# Patient Record
Sex: Female | Born: 1935
Health system: Southern US, Community
[De-identification: ages and names within clinical notes are randomized; demographics above are authoritative.]

## PROBLEM LIST (undated history)

## (undated) DIAGNOSIS — Z860100 Personal history of colon polyps, unspecified: Secondary | ICD-10-CM

## (undated) DIAGNOSIS — E039 Hypothyroidism, unspecified: Secondary | ICD-10-CM

## (undated) DIAGNOSIS — C801 Malignant (primary) neoplasm, unspecified: Secondary | ICD-10-CM

## (undated) DIAGNOSIS — I739 Peripheral vascular disease, unspecified: Secondary | ICD-10-CM

## (undated) DIAGNOSIS — I4891 Unspecified atrial fibrillation: Secondary | ICD-10-CM

## (undated) DIAGNOSIS — R079 Chest pain, unspecified: Secondary | ICD-10-CM

## (undated) DIAGNOSIS — E785 Hyperlipidemia, unspecified: Secondary | ICD-10-CM

## (undated) DIAGNOSIS — M1990 Unspecified osteoarthritis, unspecified site: Secondary | ICD-10-CM

## (undated) DIAGNOSIS — M545 Low back pain, unspecified: Secondary | ICD-10-CM

## (undated) DIAGNOSIS — R7302 Impaired glucose tolerance (oral): Secondary | ICD-10-CM

## (undated) DIAGNOSIS — D735 Infarction of spleen: Secondary | ICD-10-CM

## (undated) DIAGNOSIS — Z85038 Personal history of other malignant neoplasm of large intestine: Secondary | ICD-10-CM

## (undated) DIAGNOSIS — N390 Urinary tract infection, site not specified: Secondary | ICD-10-CM

## (undated) DIAGNOSIS — Q211 Atrial septal defect: Secondary | ICD-10-CM

## (undated) DIAGNOSIS — G629 Polyneuropathy, unspecified: Secondary | ICD-10-CM

## (undated) DIAGNOSIS — E538 Deficiency of other specified B group vitamins: Secondary | ICD-10-CM

## (undated) DIAGNOSIS — F419 Anxiety disorder, unspecified: Secondary | ICD-10-CM

## (undated) DIAGNOSIS — Q2112 Patent foramen ovale: Secondary | ICD-10-CM

## (undated) DIAGNOSIS — K5730 Diverticulosis of large intestine without perforation or abscess without bleeding: Secondary | ICD-10-CM

## (undated) DIAGNOSIS — H269 Unspecified cataract: Secondary | ICD-10-CM

## (undated) DIAGNOSIS — I059 Rheumatic mitral valve disease, unspecified: Secondary | ICD-10-CM

## (undated) DIAGNOSIS — G8929 Other chronic pain: Secondary | ICD-10-CM

## (undated) DIAGNOSIS — Z8542 Personal history of malignant neoplasm of other parts of uterus: Secondary | ICD-10-CM

## (undated) DIAGNOSIS — Z9889 Other specified postprocedural states: Secondary | ICD-10-CM

## (undated) DIAGNOSIS — I872 Venous insufficiency (chronic) (peripheral): Secondary | ICD-10-CM

## (undated) DIAGNOSIS — I10 Essential (primary) hypertension: Secondary | ICD-10-CM

## (undated) HISTORY — DX: Peripheral vascular disease, unspecified: I73.9

## (undated) HISTORY — DX: Personal history of colonic polyps: Z86.010

## (undated) HISTORY — DX: Patent foramen ovale: Q21.12

## (undated) HISTORY — DX: Essential (primary) hypertension: I10

## (undated) HISTORY — PX: EYE SURGERY: SHX253

## (undated) HISTORY — DX: Chest pain, unspecified: R07.9

## (undated) HISTORY — DX: Rheumatic mitral valve disease, unspecified: I05.9

## (undated) HISTORY — DX: Deficiency of other specified B group vitamins: E53.8

## (undated) HISTORY — DX: Diverticulosis of large intestine without perforation or abscess without bleeding: K57.30

## (undated) HISTORY — DX: Low back pain: M54.5

## (undated) HISTORY — PX: CATARACT EXTRACTION, BILATERAL: SHX1313

## (undated) HISTORY — DX: Anxiety disorder, unspecified: F41.9

## (undated) HISTORY — DX: Polyneuropathy, unspecified: G62.9

## (undated) HISTORY — DX: Personal history of colon polyps, unspecified: Z86.0100

## (undated) HISTORY — DX: Hypothyroidism, unspecified: E03.9

## (undated) HISTORY — DX: Unspecified cataract: H26.9

## (undated) HISTORY — DX: Other chronic pain: G89.29

## (undated) HISTORY — DX: Urinary tract infection, site not specified: N39.0

## (undated) HISTORY — DX: Personal history of other malignant neoplasm of large intestine: Z85.038

## (undated) HISTORY — DX: Atrial septal defect: Q21.1

## (undated) HISTORY — DX: Hyperlipidemia, unspecified: E78.5

## (undated) HISTORY — DX: Impaired glucose tolerance (oral): R73.02

## (undated) HISTORY — DX: Low back pain, unspecified: M54.50

## (undated) HISTORY — DX: Personal history of malignant neoplasm of other parts of uterus: Z85.42

## (undated) HISTORY — DX: Unspecified atrial fibrillation: I48.91

## (undated) HISTORY — DX: Venous insufficiency (chronic) (peripheral): I87.2

## (undated) HISTORY — DX: Unspecified osteoarthritis, unspecified site: M19.90

## (undated) HISTORY — PX: OTHER SURGICAL HISTORY: SHX169

## (undated) HISTORY — DX: Other specified postprocedural states: Z98.890

## (undated) HISTORY — DX: Infarction of spleen: D73.5

---

## 1992-11-26 HISTORY — PX: OTHER SURGICAL HISTORY: SHX169

## 1993-10-30 ENCOUNTER — Encounter: Payer: Self-pay | Admitting: Internal Medicine

## 1994-08-27 ENCOUNTER — Encounter: Payer: Self-pay | Admitting: Internal Medicine

## 1995-09-23 ENCOUNTER — Encounter: Payer: Self-pay | Admitting: Internal Medicine

## 1999-01-11 ENCOUNTER — Other Ambulatory Visit: Admission: RE | Admit: 1999-01-11 | Discharge: 1999-01-11 | Payer: Self-pay | Admitting: Obstetrics & Gynecology

## 2000-01-17 ENCOUNTER — Emergency Department (HOSPITAL_COMMUNITY): Admission: EM | Admit: 2000-01-17 | Discharge: 2000-01-17 | Payer: Self-pay | Admitting: Emergency Medicine

## 2000-01-17 ENCOUNTER — Encounter: Payer: Self-pay | Admitting: Emergency Medicine

## 2000-03-15 ENCOUNTER — Other Ambulatory Visit: Admission: RE | Admit: 2000-03-15 | Discharge: 2000-03-15 | Payer: Self-pay | Admitting: Obstetrics & Gynecology

## 2001-06-17 ENCOUNTER — Other Ambulatory Visit: Admission: RE | Admit: 2001-06-17 | Discharge: 2001-06-17 | Payer: Self-pay | Admitting: Obstetrics & Gynecology

## 2002-02-10 ENCOUNTER — Ambulatory Visit (HOSPITAL_COMMUNITY): Admission: RE | Admit: 2002-02-10 | Discharge: 2002-02-10 | Payer: Self-pay | Admitting: Internal Medicine

## 2002-02-10 ENCOUNTER — Encounter: Payer: Self-pay | Admitting: Internal Medicine

## 2002-07-22 ENCOUNTER — Other Ambulatory Visit: Admission: RE | Admit: 2002-07-22 | Discharge: 2002-07-22 | Payer: Self-pay | Admitting: Obstetrics & Gynecology

## 2003-06-23 ENCOUNTER — Ambulatory Visit (HOSPITAL_COMMUNITY): Admission: RE | Admit: 2003-06-23 | Discharge: 2003-06-23 | Payer: Self-pay | Admitting: Cardiovascular Disease

## 2003-08-26 ENCOUNTER — Ambulatory Visit (HOSPITAL_COMMUNITY): Admission: RE | Admit: 2003-08-26 | Discharge: 2003-08-26 | Payer: Self-pay | Admitting: Orthopedic Surgery

## 2003-08-26 ENCOUNTER — Encounter: Payer: Self-pay | Admitting: Orthopedic Surgery

## 2003-10-20 ENCOUNTER — Inpatient Hospital Stay (HOSPITAL_COMMUNITY): Admission: EM | Admit: 2003-10-20 | Discharge: 2003-10-26 | Payer: Self-pay | Admitting: Emergency Medicine

## 2004-06-05 ENCOUNTER — Emergency Department (HOSPITAL_COMMUNITY): Admission: EM | Admit: 2004-06-05 | Discharge: 2004-06-05 | Payer: Self-pay | Admitting: Emergency Medicine

## 2004-09-08 ENCOUNTER — Other Ambulatory Visit: Admission: RE | Admit: 2004-09-08 | Discharge: 2004-09-08 | Payer: Self-pay | Admitting: Obstetrics and Gynecology

## 2004-10-04 ENCOUNTER — Ambulatory Visit: Payer: Self-pay | Admitting: Pulmonary Disease

## 2004-10-17 ENCOUNTER — Ambulatory Visit: Payer: Self-pay | Admitting: Internal Medicine

## 2004-10-17 ENCOUNTER — Ambulatory Visit: Payer: Self-pay | Admitting: Cardiology

## 2004-10-17 ENCOUNTER — Ambulatory Visit: Payer: Self-pay

## 2004-10-24 ENCOUNTER — Ambulatory Visit: Payer: Self-pay | Admitting: Internal Medicine

## 2004-11-01 ENCOUNTER — Ambulatory Visit: Payer: Self-pay | Admitting: Pulmonary Disease

## 2004-11-09 ENCOUNTER — Emergency Department (HOSPITAL_COMMUNITY): Admission: EM | Admit: 2004-11-09 | Discharge: 2004-11-09 | Payer: Self-pay | Admitting: Emergency Medicine

## 2004-11-14 ENCOUNTER — Ambulatory Visit: Payer: Self-pay | Admitting: Cardiology

## 2004-11-26 HISTORY — PX: OTHER SURGICAL HISTORY: SHX169

## 2004-12-07 ENCOUNTER — Ambulatory Visit: Payer: Self-pay

## 2004-12-21 ENCOUNTER — Ambulatory Visit: Payer: Self-pay | Admitting: Cardiology

## 2004-12-27 ENCOUNTER — Ambulatory Visit: Payer: Self-pay | Admitting: Cardiology

## 2005-01-02 ENCOUNTER — Ambulatory Visit: Payer: Self-pay | Admitting: Pulmonary Disease

## 2005-01-03 ENCOUNTER — Ambulatory Visit: Payer: Self-pay | Admitting: Internal Medicine

## 2005-01-17 ENCOUNTER — Ambulatory Visit: Payer: Self-pay | Admitting: Cardiology

## 2005-02-07 ENCOUNTER — Ambulatory Visit: Payer: Self-pay | Admitting: Cardiology

## 2005-02-19 ENCOUNTER — Ambulatory Visit: Payer: Self-pay | Admitting: Internal Medicine

## 2005-02-21 ENCOUNTER — Ambulatory Visit: Payer: Self-pay | Admitting: Cardiology

## 2005-02-27 ENCOUNTER — Ambulatory Visit: Payer: Self-pay | Admitting: Internal Medicine

## 2005-03-02 ENCOUNTER — Ambulatory Visit: Payer: Self-pay | Admitting: Cardiovascular Disease

## 2005-03-06 ENCOUNTER — Ambulatory Visit: Payer: Self-pay | Admitting: Cardiology

## 2005-03-22 ENCOUNTER — Ambulatory Visit: Payer: Self-pay | Admitting: Cardiology

## 2005-03-26 ENCOUNTER — Ambulatory Visit: Payer: Self-pay | Admitting: Cardiovascular Disease

## 2005-03-28 ENCOUNTER — Ambulatory Visit: Payer: Self-pay | Admitting: *Deleted

## 2005-04-11 ENCOUNTER — Ambulatory Visit: Payer: Self-pay | Admitting: Cardiology

## 2005-04-24 ENCOUNTER — Ambulatory Visit: Payer: Self-pay | Admitting: Pulmonary Disease

## 2005-05-02 ENCOUNTER — Ambulatory Visit: Payer: Self-pay | Admitting: Pulmonary Disease

## 2005-05-09 ENCOUNTER — Ambulatory Visit: Payer: Self-pay | Admitting: *Deleted

## 2005-05-11 ENCOUNTER — Inpatient Hospital Stay (HOSPITAL_COMMUNITY): Admission: RE | Admit: 2005-05-11 | Discharge: 2005-05-15 | Payer: Self-pay | Admitting: Neurosurgery

## 2005-05-18 ENCOUNTER — Ambulatory Visit: Payer: Self-pay | Admitting: Cardiology

## 2005-05-25 ENCOUNTER — Ambulatory Visit: Payer: Self-pay | Admitting: Cardiology

## 2005-06-08 ENCOUNTER — Ambulatory Visit: Payer: Self-pay | Admitting: Cardiology

## 2005-07-06 ENCOUNTER — Ambulatory Visit: Payer: Self-pay | Admitting: Cardiology

## 2005-08-02 ENCOUNTER — Ambulatory Visit: Payer: Self-pay | Admitting: Internal Medicine

## 2005-08-06 ENCOUNTER — Ambulatory Visit: Payer: Self-pay | Admitting: Cardiovascular Disease

## 2005-08-30 ENCOUNTER — Ambulatory Visit: Payer: Self-pay | Admitting: Cardiology

## 2005-09-04 ENCOUNTER — Ambulatory Visit: Payer: Self-pay | Admitting: Pulmonary Disease

## 2005-09-27 ENCOUNTER — Ambulatory Visit: Payer: Self-pay | Admitting: Cardiology

## 2005-10-25 ENCOUNTER — Ambulatory Visit: Payer: Self-pay | Admitting: Cardiology

## 2005-10-30 ENCOUNTER — Ambulatory Visit: Payer: Self-pay | Admitting: Pulmonary Disease

## 2005-11-22 ENCOUNTER — Ambulatory Visit: Payer: Self-pay | Admitting: *Deleted

## 2005-12-05 ENCOUNTER — Ambulatory Visit: Payer: Self-pay | Admitting: Pulmonary Disease

## 2005-12-13 ENCOUNTER — Ambulatory Visit: Payer: Self-pay | Admitting: Cardiovascular Disease

## 2005-12-20 ENCOUNTER — Ambulatory Visit: Payer: Self-pay | Admitting: Cardiology

## 2006-01-15 ENCOUNTER — Encounter: Payer: Self-pay | Admitting: Cardiovascular Disease

## 2006-01-15 ENCOUNTER — Ambulatory Visit: Payer: Self-pay

## 2006-01-15 ENCOUNTER — Ambulatory Visit: Payer: Self-pay | Admitting: Cardiology

## 2006-01-15 ENCOUNTER — Ambulatory Visit: Payer: Self-pay | Admitting: Cardiovascular Disease

## 2006-02-13 ENCOUNTER — Ambulatory Visit: Payer: Self-pay | Admitting: Cardiology

## 2006-02-13 ENCOUNTER — Ambulatory Visit: Payer: Self-pay | Admitting: Pulmonary Disease

## 2006-02-27 ENCOUNTER — Ambulatory Visit: Payer: Self-pay | Admitting: Cardiovascular Disease

## 2006-03-13 ENCOUNTER — Ambulatory Visit: Payer: Self-pay | Admitting: Cardiovascular Disease

## 2006-03-26 HISTORY — PX: ANTERIOR AND POSTERIOR VAGINAL REPAIR: SUR5

## 2006-04-10 ENCOUNTER — Ambulatory Visit: Payer: Self-pay | Admitting: Cardiology

## 2006-04-24 ENCOUNTER — Inpatient Hospital Stay (HOSPITAL_COMMUNITY): Admission: RE | Admit: 2006-04-24 | Discharge: 2006-04-26 | Payer: Self-pay | Admitting: Obstetrics & Gynecology

## 2006-05-03 ENCOUNTER — Ambulatory Visit: Payer: Self-pay | Admitting: Cardiology

## 2006-05-10 ENCOUNTER — Ambulatory Visit: Payer: Self-pay | Admitting: Cardiology

## 2006-05-27 ENCOUNTER — Ambulatory Visit: Payer: Self-pay | Admitting: Cardiovascular Disease

## 2006-05-31 ENCOUNTER — Ambulatory Visit: Payer: Self-pay | Admitting: Pulmonary Disease

## 2006-06-04 ENCOUNTER — Ambulatory Visit: Payer: Self-pay | Admitting: *Deleted

## 2006-06-24 ENCOUNTER — Ambulatory Visit: Payer: Self-pay | Admitting: Cardiovascular Disease

## 2006-07-02 ENCOUNTER — Ambulatory Visit: Payer: Self-pay | Admitting: Cardiology

## 2006-07-16 ENCOUNTER — Ambulatory Visit: Payer: Self-pay | Admitting: Internal Medicine

## 2006-07-26 ENCOUNTER — Ambulatory Visit: Payer: Self-pay | Admitting: Cardiovascular Disease

## 2006-07-26 ENCOUNTER — Ambulatory Visit: Payer: Self-pay | Admitting: Cardiology

## 2006-07-30 ENCOUNTER — Ambulatory Visit: Payer: Self-pay | Admitting: Cardiovascular Disease

## 2006-08-05 ENCOUNTER — Ambulatory Visit: Payer: Self-pay | Admitting: Cardiovascular Disease

## 2006-08-08 ENCOUNTER — Ambulatory Visit: Payer: Self-pay | Admitting: Cardiology

## 2006-08-23 ENCOUNTER — Ambulatory Visit: Payer: Self-pay | Admitting: Cardiovascular Disease

## 2006-08-23 ENCOUNTER — Ambulatory Visit: Payer: Self-pay | Admitting: Cardiology

## 2006-08-30 ENCOUNTER — Ambulatory Visit: Payer: Self-pay | Admitting: Pulmonary Disease

## 2006-08-30 ENCOUNTER — Ambulatory Visit: Payer: Self-pay | Admitting: Cardiovascular Disease

## 2006-09-05 ENCOUNTER — Ambulatory Visit: Payer: Self-pay | Admitting: Cardiology

## 2006-10-03 ENCOUNTER — Ambulatory Visit: Payer: Self-pay | Admitting: Internal Medicine

## 2006-10-03 ENCOUNTER — Ambulatory Visit: Payer: Self-pay | Admitting: Cardiovascular Disease

## 2006-10-11 ENCOUNTER — Ambulatory Visit: Payer: Self-pay | Admitting: Pulmonary Disease

## 2006-10-11 ENCOUNTER — Ambulatory Visit: Payer: Self-pay | Admitting: Internal Medicine

## 2006-11-05 ENCOUNTER — Ambulatory Visit: Payer: Self-pay | Admitting: Cardiovascular Disease

## 2007-01-20 ENCOUNTER — Emergency Department (HOSPITAL_COMMUNITY): Admission: EM | Admit: 2007-01-20 | Discharge: 2007-01-20 | Payer: Self-pay | Admitting: Emergency Medicine

## 2007-02-25 HISTORY — PX: OTHER SURGICAL HISTORY: SHX169

## 2007-03-05 ENCOUNTER — Encounter: Admission: RE | Admit: 2007-03-05 | Discharge: 2007-03-05 | Payer: Self-pay | Admitting: Orthopedic Surgery

## 2007-03-06 ENCOUNTER — Ambulatory Visit (HOSPITAL_BASED_OUTPATIENT_CLINIC_OR_DEPARTMENT_OTHER): Admission: RE | Admit: 2007-03-06 | Discharge: 2007-03-06 | Payer: Self-pay | Admitting: Orthopedic Surgery

## 2007-04-07 ENCOUNTER — Ambulatory Visit: Payer: Self-pay | Admitting: Cardiovascular Disease

## 2007-04-07 LAB — CONVERTED CEMR LAB
Chloride: 105 meq/L (ref 96–112)
GFR calc Af Amer: 105 mL/min
GFR calc non Af Amer: 86 mL/min
Sodium: 143 meq/L (ref 135–145)

## 2007-04-23 ENCOUNTER — Emergency Department (HOSPITAL_COMMUNITY): Admission: EM | Admit: 2007-04-23 | Discharge: 2007-04-24 | Payer: Self-pay | Admitting: Emergency Medicine

## 2007-04-25 ENCOUNTER — Ambulatory Visit: Payer: Self-pay | Admitting: Internal Medicine

## 2007-04-25 ENCOUNTER — Inpatient Hospital Stay (HOSPITAL_COMMUNITY): Admission: EM | Admit: 2007-04-25 | Discharge: 2007-04-29 | Payer: Self-pay | Admitting: Emergency Medicine

## 2007-05-06 ENCOUNTER — Ambulatory Visit: Payer: Self-pay | Admitting: Pulmonary Disease

## 2007-05-06 LAB — CONVERTED CEMR LAB
Bilirubin Urine: NEGATIVE
Hemoglobin, Urine: NEGATIVE
Ketones, ur: NEGATIVE mg/dL
Leukocytes, UA: NEGATIVE
Specific Gravity, Urine: 1.01 (ref 1.000–1.03)
Total Protein, Urine: NEGATIVE mg/dL
Urine Glucose: NEGATIVE mg/dL
pH: 5.5 (ref 5.0–8.0)

## 2007-05-09 ENCOUNTER — Ambulatory Visit: Payer: Self-pay | Admitting: Cardiovascular Disease

## 2007-05-12 ENCOUNTER — Ambulatory Visit: Payer: Self-pay | Admitting: Pulmonary Disease

## 2007-05-12 ENCOUNTER — Ambulatory Visit: Payer: Self-pay | Admitting: Cardiology

## 2007-05-12 LAB — CONVERTED CEMR LAB
ALT: 14 units/L (ref 0–40)
AST: 16 units/L (ref 0–37)
Albumin: 3.7 g/dL (ref 3.5–5.2)
Basophils Absolute: 0 10*3/uL (ref 0.0–0.1)
Bilirubin, Direct: 0.2 mg/dL (ref 0.0–0.3)
Calcium: 8.9 mg/dL (ref 8.4–10.5)
Chloride: 109 meq/L (ref 96–112)
Eosinophils Absolute: 0.2 10*3/uL (ref 0.0–0.6)
GFR calc Af Amer: 105 mL/min
GFR calc non Af Amer: 86 mL/min
Glucose, Bld: 106 mg/dL — ABNORMAL HIGH (ref 70–99)
Lymphocytes Relative: 21.9 % (ref 12.0–46.0)
MCHC: 35.1 g/dL (ref 30.0–36.0)
MCV: 89.9 fL (ref 78.0–100.0)
Monocytes Relative: 6.8 % (ref 3.0–11.0)
Neutro Abs: 5.5 10*3/uL (ref 1.4–7.7)
Platelets: 355 10*3/uL (ref 150–400)
RBC: 3.81 M/uL — ABNORMAL LOW (ref 3.87–5.11)
Sed Rate: 63 mm/hr — ABNORMAL HIGH (ref 0–25)
WBC: 8.1 10*3/uL (ref 4.5–10.5)

## 2007-05-14 ENCOUNTER — Ambulatory Visit: Payer: Self-pay | Admitting: Cardiology

## 2007-05-14 ENCOUNTER — Ambulatory Visit: Payer: Self-pay | Admitting: Cardiovascular Disease

## 2007-05-14 ENCOUNTER — Ambulatory Visit: Payer: Self-pay | Admitting: Internal Medicine

## 2007-05-15 ENCOUNTER — Ambulatory Visit: Payer: Self-pay | Admitting: Internal Medicine

## 2007-05-16 ENCOUNTER — Ambulatory Visit: Payer: Self-pay | Admitting: Cardiovascular Disease

## 2007-05-23 ENCOUNTER — Ambulatory Visit: Payer: Self-pay | Admitting: Cardiology

## 2007-05-26 ENCOUNTER — Ambulatory Visit: Payer: Self-pay | Admitting: Pulmonary Disease

## 2007-05-29 ENCOUNTER — Ambulatory Visit: Payer: Self-pay | Admitting: Cardiovascular Disease

## 2007-05-29 ENCOUNTER — Ambulatory Visit: Payer: Self-pay | Admitting: Internal Medicine

## 2007-06-05 ENCOUNTER — Ambulatory Visit: Payer: Self-pay

## 2007-06-06 ENCOUNTER — Ambulatory Visit: Payer: Self-pay | Admitting: Cardiovascular Disease

## 2007-06-07 ENCOUNTER — Encounter (INDEPENDENT_AMBULATORY_CARE_PROVIDER_SITE_OTHER): Payer: Self-pay | Admitting: *Deleted

## 2007-06-07 ENCOUNTER — Encounter: Admission: RE | Admit: 2007-06-07 | Discharge: 2007-06-07 | Payer: Self-pay | Admitting: Cardiovascular Disease

## 2007-06-11 ENCOUNTER — Ambulatory Visit: Payer: Self-pay | Admitting: Internal Medicine

## 2007-06-18 ENCOUNTER — Ambulatory Visit: Payer: Self-pay | Admitting: Internal Medicine

## 2007-07-02 ENCOUNTER — Ambulatory Visit: Payer: Self-pay | Admitting: Cardiology

## 2007-07-08 ENCOUNTER — Ambulatory Visit: Payer: Self-pay | Admitting: Pulmonary Disease

## 2007-07-10 ENCOUNTER — Ambulatory Visit: Payer: Self-pay | Admitting: Internal Medicine

## 2007-07-18 ENCOUNTER — Ambulatory Visit: Payer: Self-pay | Admitting: Cardiology

## 2007-07-21 ENCOUNTER — Ambulatory Visit: Payer: Self-pay | Admitting: Cardiovascular Disease

## 2007-07-31 ENCOUNTER — Ambulatory Visit: Payer: Self-pay | Admitting: Cardiology

## 2007-07-31 ENCOUNTER — Ambulatory Visit: Payer: Self-pay

## 2007-07-31 ENCOUNTER — Encounter: Payer: Self-pay | Admitting: Cardiovascular Disease

## 2007-08-07 ENCOUNTER — Ambulatory Visit: Payer: Self-pay | Admitting: Internal Medicine

## 2007-09-02 ENCOUNTER — Ambulatory Visit: Payer: Self-pay | Admitting: Cardiovascular Disease

## 2007-10-02 ENCOUNTER — Ambulatory Visit: Payer: Self-pay | Admitting: Cardiology

## 2007-10-08 ENCOUNTER — Ambulatory Visit: Payer: Self-pay | Admitting: Pulmonary Disease

## 2007-10-08 LAB — CONVERTED CEMR LAB
Ketones, ur: NEGATIVE mg/dL
Leukocytes, UA: NEGATIVE
Nitrite: NEGATIVE
Urobilinogen, UA: 0.2 (ref 0.0–1.0)
pH: 6 (ref 5.0–8.0)

## 2007-11-05 ENCOUNTER — Ambulatory Visit: Payer: Self-pay | Admitting: Cardiovascular Disease

## 2007-11-14 ENCOUNTER — Encounter: Payer: Self-pay | Admitting: Pulmonary Disease

## 2007-11-18 ENCOUNTER — Ambulatory Visit: Payer: Self-pay | Admitting: Cardiology

## 2007-12-02 ENCOUNTER — Ambulatory Visit: Payer: Self-pay | Admitting: Cardiology

## 2007-12-24 ENCOUNTER — Ambulatory Visit: Payer: Self-pay | Admitting: Internal Medicine

## 2008-01-13 DIAGNOSIS — M545 Low back pain: Secondary | ICD-10-CM | POA: Insufficient documentation

## 2008-01-13 DIAGNOSIS — I4891 Unspecified atrial fibrillation: Secondary | ICD-10-CM

## 2008-01-13 DIAGNOSIS — E785 Hyperlipidemia, unspecified: Secondary | ICD-10-CM

## 2008-01-13 DIAGNOSIS — E039 Hypothyroidism, unspecified: Secondary | ICD-10-CM

## 2008-01-13 DIAGNOSIS — I10 Essential (primary) hypertension: Secondary | ICD-10-CM

## 2008-01-13 DIAGNOSIS — C189 Malignant neoplasm of colon, unspecified: Secondary | ICD-10-CM

## 2008-01-21 ENCOUNTER — Ambulatory Visit: Payer: Self-pay | Admitting: Cardiology

## 2008-01-29 ENCOUNTER — Emergency Department (HOSPITAL_COMMUNITY): Admission: EM | Admit: 2008-01-29 | Discharge: 2008-01-29 | Payer: Self-pay | Admitting: Family Medicine

## 2008-01-30 ENCOUNTER — Ambulatory Visit: Payer: Self-pay | Admitting: Cardiology

## 2008-01-30 ENCOUNTER — Ambulatory Visit: Payer: Self-pay | Admitting: Cardiovascular Disease

## 2008-02-13 ENCOUNTER — Ambulatory Visit: Payer: Self-pay | Admitting: Cardiology

## 2008-02-16 ENCOUNTER — Ambulatory Visit: Payer: Self-pay | Admitting: Pulmonary Disease

## 2008-02-16 ENCOUNTER — Encounter: Payer: Self-pay | Admitting: Adult Health

## 2008-02-16 ENCOUNTER — Telehealth: Payer: Self-pay | Admitting: Critical Care Medicine

## 2008-02-17 LAB — CONVERTED CEMR LAB
BUN: 11 mg/dL (ref 6–23)
Basophils Relative: 0.9 % (ref 0.0–1.0)
CO2: 29 meq/L (ref 19–32)
Calcium: 9 mg/dL (ref 8.4–10.5)
Chloride: 104 meq/L (ref 96–112)
Creatinine, Ser: 0.6 mg/dL (ref 0.4–1.2)
Eosinophils Relative: 2.6 % (ref 0.0–5.0)
Glucose, Bld: 87 mg/dL (ref 70–99)
Hemoglobin: 12.5 g/dL (ref 12.0–15.0)
Lymphocytes Relative: 20.2 % (ref 12.0–46.0)
Neutro Abs: 6.8 10*3/uL (ref 1.4–7.7)
Neutrophils Relative %: 67.1 % (ref 43.0–77.0)
RBC: 4.14 M/uL (ref 3.87–5.11)
WBC: 10.1 10*3/uL (ref 4.5–10.5)

## 2008-02-18 IMAGING — CT CT ABDOMEN W/ CM
2 of 8 series · 15 of 46 positions shown, 19 images · IV contrast (Omnipaque 300)
Comparison: None.

CLINICAL DATA: Left upper quadrant pain with fever.  Urinary tract infection.  
ABDOMEN CT WITH CONTRAS ? 05/12/07:
TECHNIQUE: Multidetector CT imaging of the abdomen was performed following the standard protocol during bolus administration of intravenous contrast.
Contrast:  125 cc Omnipaque 300.
TECHNIQUE: Multidetector CT imaging of the pelvis was performed following the standard protocol during bolus administration of intravenous contrast.

[Series 2: abd_pel 5.0 b30f st · axial · 0.82mm/px · z∈[-376,+4]mm · 12 of 86 slices shown, 16 images]
[im 5/86  soft-tissue]
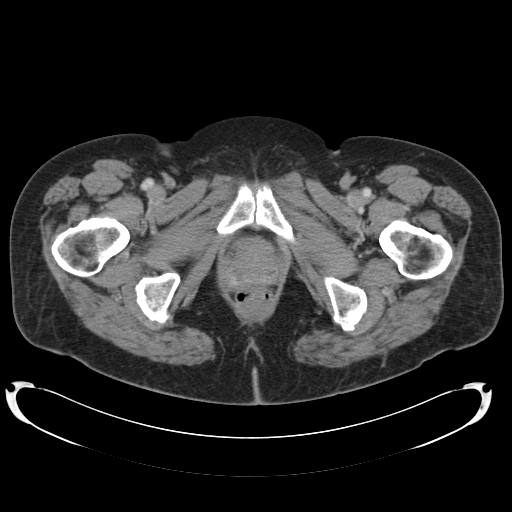
[im 5/86  bone]
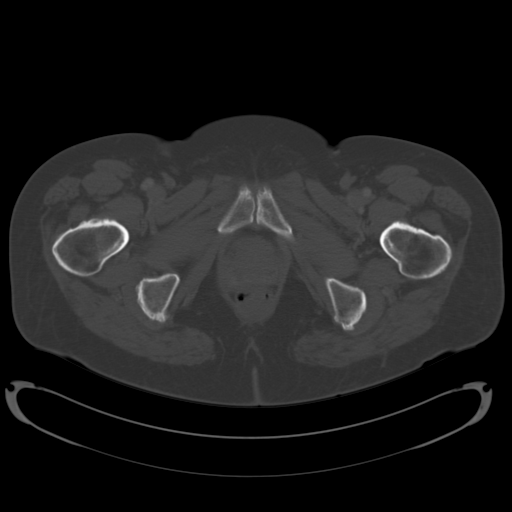
[im 15/86  soft-tissue]
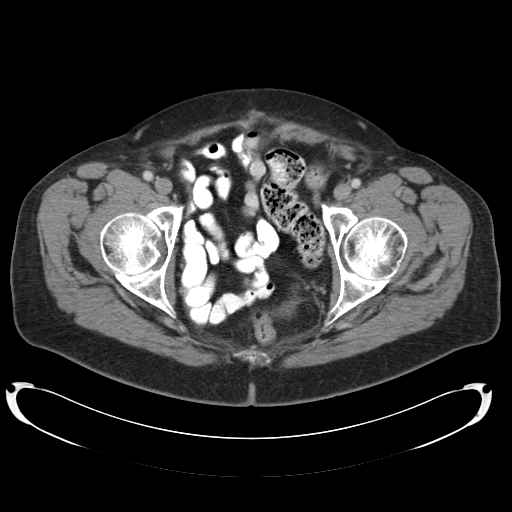
[im 24/86  soft-tissue]
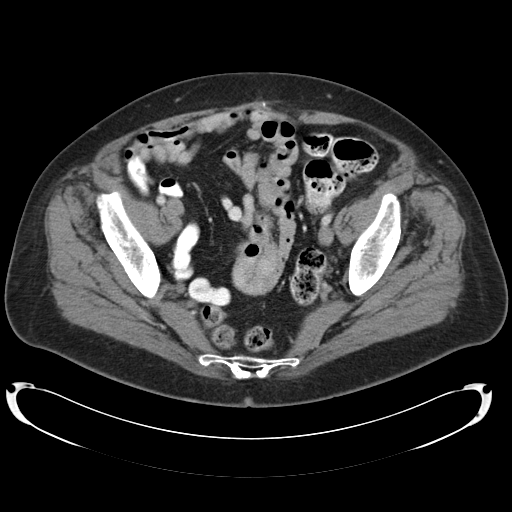
[im 29/86  soft-tissue]
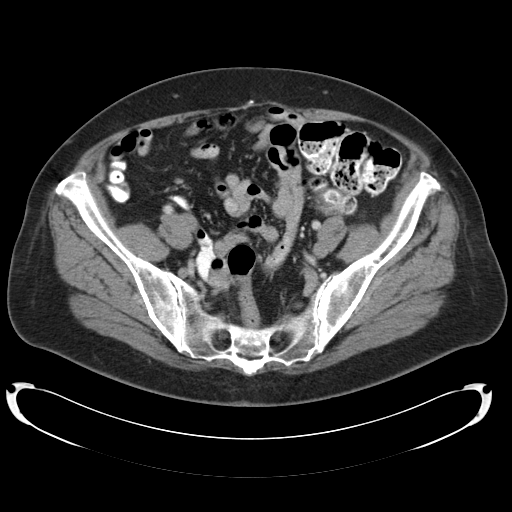
[im 38/86  soft-tissue]
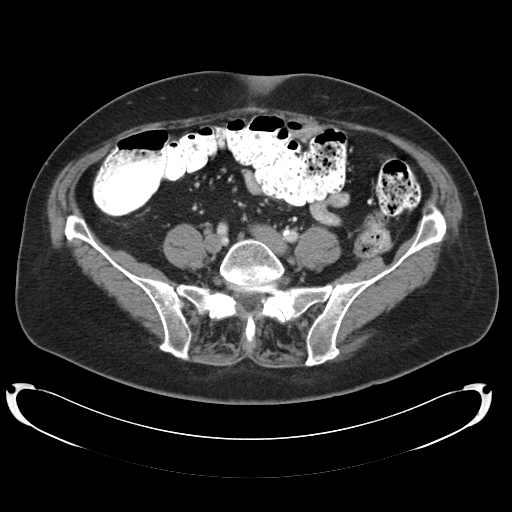
[im 48/86  soft-tissue]
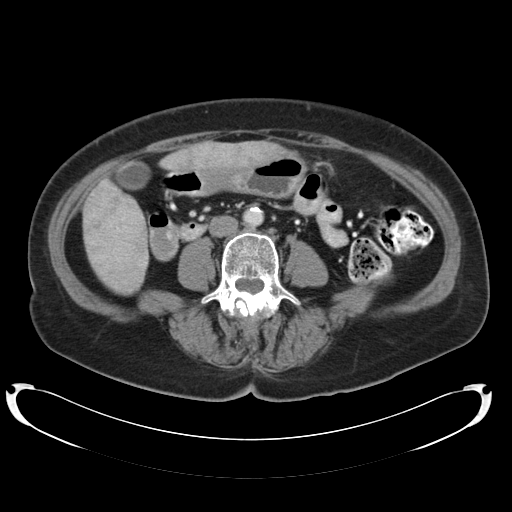
[im 57/86  soft-tissue]
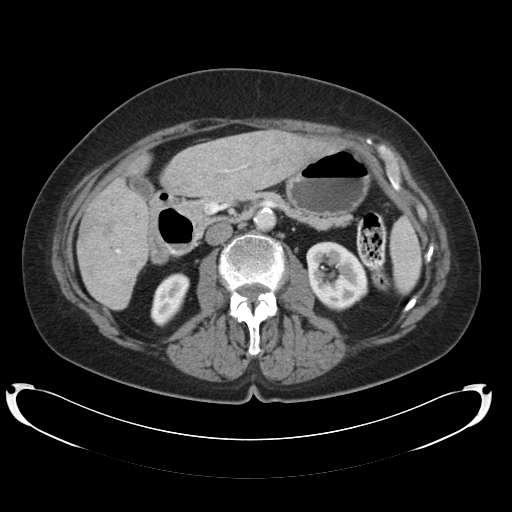
[im 62/86  soft-tissue]
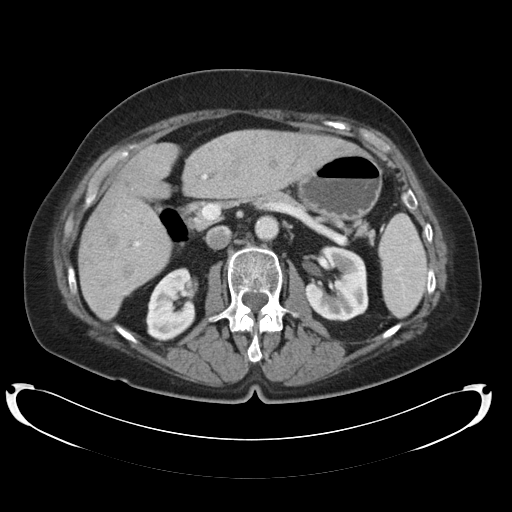
[im 67/86  lung]
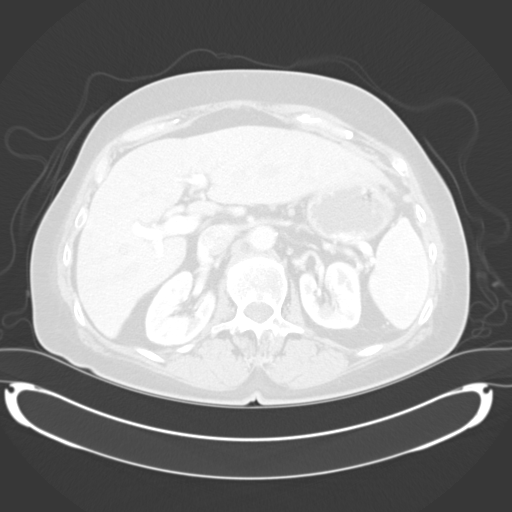
[im 71/86  soft-tissue]
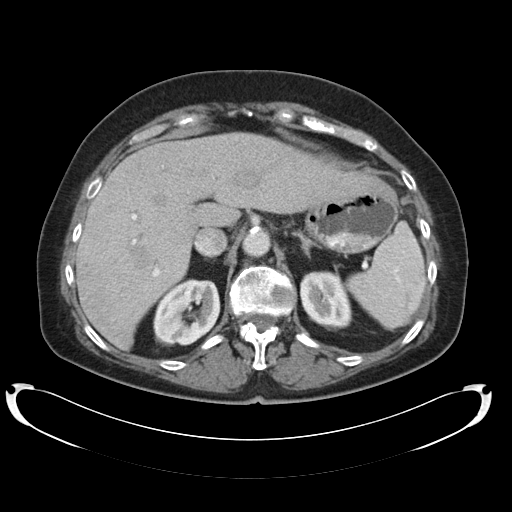
[im 71/86  lung]
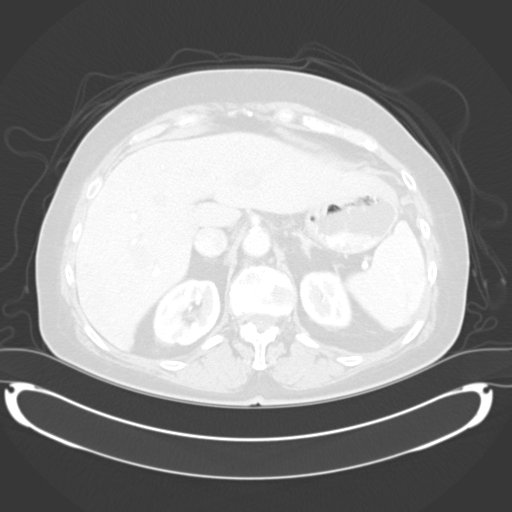
[im 71/86  bone]
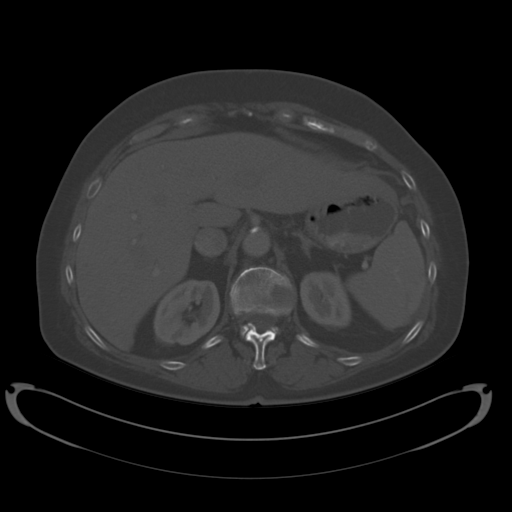
[im 76/86  lung]
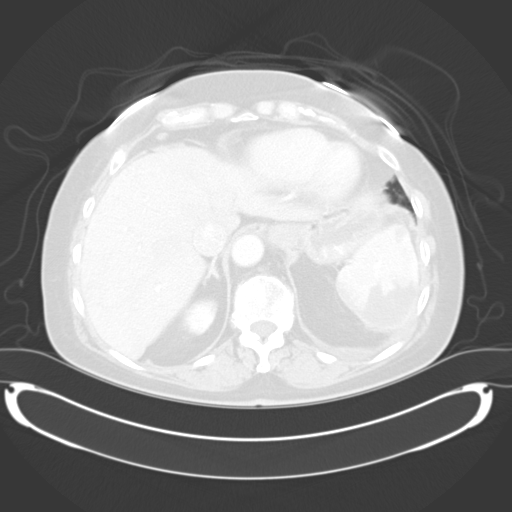
[im 81/86  soft-tissue]
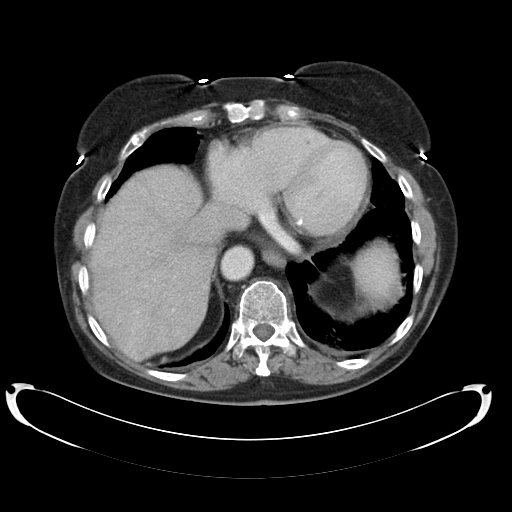
[im 81/86  lung]
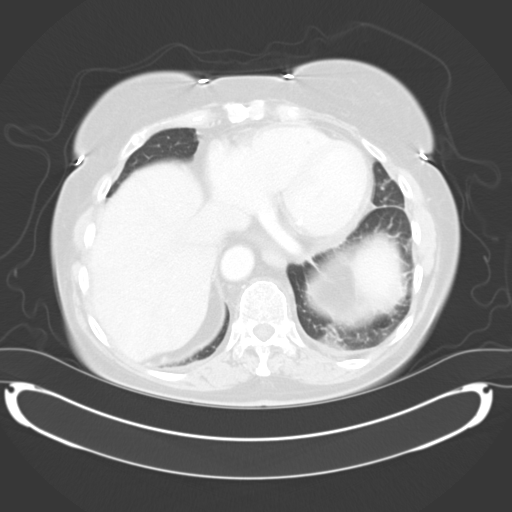

[Series 602: <mpr thick range> · coronal · 0.85mm/px · 3 of 82 slices shown]
[im 21/82  soft-tissue]
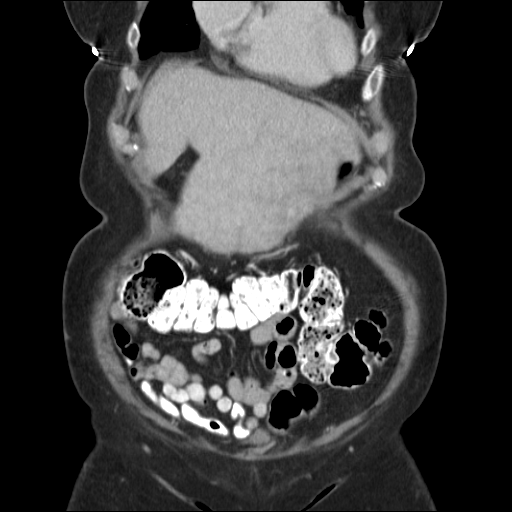
[im 41/82  soft-tissue]
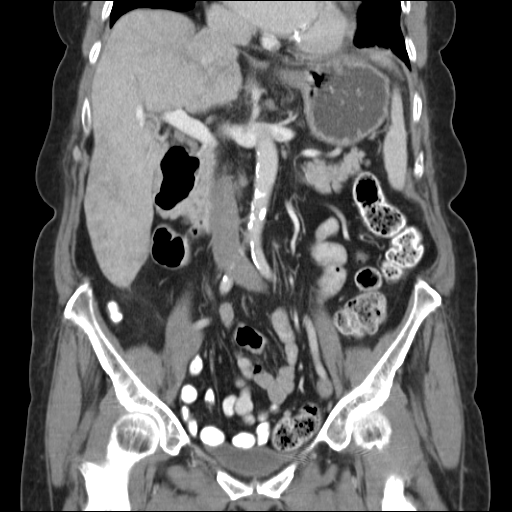
[im 61/82  soft-tissue]
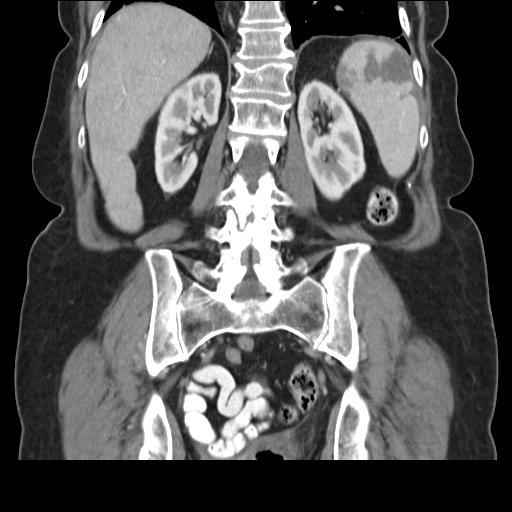

[15 of 46 positions shown; findings below may reference images not displayed]

FINDINGS: Trace left pleural effusion.  Probable subpleural atelectasis in the right lower lobe.  Heart size is at the upper limits of normal.  
The liver is enlarged at 24 cm. Mild intrahepatic biliary duct dilatation.  There are scattered subcentimeter low attenuation lesions.  The gallbladder and adrenal glands are unremarkable.   There is mild heterogeneity to the enhancement of the upper pole of the right kidney.  A small cyst is seen in the upper pole of the left kidney.   A large area of infarction is seen in the spleen.  The pancreas is unremarkable.   There is mild gastric antral wall thickening.  No pathologically enlarged lymph nodes.
IMPRESSION: 1.  Question infarction vs focal pyelonephritis in the upper pole of the right kidney. 
2.  Splenic infarction. 
3.  Gastric antral wall thickening.  This may be due to peristalsis but mass cannot be excluded. 
4.  Hepatomegaly. 
PELVIS CT WITH CONTRAST:
FINDINGS: The patient appears to be status-post right hemicolectomy.  The colon is otherwise unremarkable.  The uterus is absent.  No pathologically enlarged lymph nodes.  No free fluid.   Atherosclerotic calcification in the arterial vasculature.   No worrisome lytic or sclerotic lesions.
IMPRESSION: No acute findings.

## 2008-02-20 ENCOUNTER — Ambulatory Visit: Payer: Self-pay | Admitting: Pulmonary Disease

## 2008-02-20 DIAGNOSIS — I872 Venous insufficiency (chronic) (peripheral): Secondary | ICD-10-CM

## 2008-02-20 DIAGNOSIS — I739 Peripheral vascular disease, unspecified: Secondary | ICD-10-CM | POA: Insufficient documentation

## 2008-02-20 DIAGNOSIS — I340 Nonrheumatic mitral (valve) insufficiency: Secondary | ICD-10-CM | POA: Insufficient documentation

## 2008-02-20 DIAGNOSIS — M1990 Unspecified osteoarthritis, unspecified site: Secondary | ICD-10-CM | POA: Insufficient documentation

## 2008-02-20 DIAGNOSIS — Z8542 Personal history of malignant neoplasm of other parts of uterus: Secondary | ICD-10-CM

## 2008-02-20 DIAGNOSIS — N390 Urinary tract infection, site not specified: Secondary | ICD-10-CM

## 2008-02-20 DIAGNOSIS — F411 Generalized anxiety disorder: Secondary | ICD-10-CM | POA: Insufficient documentation

## 2008-02-20 DIAGNOSIS — M810 Age-related osteoporosis without current pathological fracture: Secondary | ICD-10-CM | POA: Insufficient documentation

## 2008-02-20 DIAGNOSIS — D7389 Other diseases of spleen: Secondary | ICD-10-CM | POA: Insufficient documentation

## 2008-03-03 ENCOUNTER — Ambulatory Visit: Payer: Self-pay | Admitting: Internal Medicine

## 2008-03-25 ENCOUNTER — Encounter: Payer: Self-pay | Admitting: Pulmonary Disease

## 2008-03-26 ENCOUNTER — Ambulatory Visit: Payer: Self-pay | Admitting: Cardiology

## 2008-04-09 ENCOUNTER — Ambulatory Visit: Payer: Self-pay | Admitting: Internal Medicine

## 2008-04-21 LAB — CONVERTED CEMR LAB
ALT: 16 units/L (ref 0–35)
Albumin: 3.6 g/dL (ref 3.5–5.2)
BUN: 17 mg/dL (ref 6–23)
Bilirubin, Direct: 0.2 mg/dL (ref 0.0–0.3)
CO2: 29 meq/L (ref 19–32)
Calcium: 8.9 mg/dL (ref 8.4–10.5)
Cholesterol: 114 mg/dL (ref 0–200)
Glucose, Bld: 102 mg/dL — ABNORMAL HIGH (ref 70–99)
HDL: 35.4 mg/dL — ABNORMAL LOW (ref >39.0)
LDL Cholesterol: 62 mg/dL (ref 0–99)
Sodium: 139 meq/L (ref 135–145)
Total Protein: 6.8 g/dL (ref 6.0–8.3)
Triglycerides: 85 mg/dL (ref 0–149)
VLDL: 17 mg/dL (ref 0–40)

## 2008-04-23 ENCOUNTER — Ambulatory Visit: Payer: Self-pay | Admitting: Cardiology

## 2008-05-07 ENCOUNTER — Ambulatory Visit: Payer: Self-pay | Admitting: Cardiology

## 2008-05-26 ENCOUNTER — Ambulatory Visit: Payer: Self-pay | Admitting: Cardiology

## 2008-06-16 ENCOUNTER — Ambulatory Visit: Payer: Self-pay | Admitting: Internal Medicine

## 2008-06-22 ENCOUNTER — Ambulatory Visit: Payer: Self-pay | Admitting: Pulmonary Disease

## 2008-06-22 DIAGNOSIS — G609 Hereditary and idiopathic neuropathy, unspecified: Secondary | ICD-10-CM | POA: Insufficient documentation

## 2008-06-25 LAB — CONVERTED CEMR LAB
Bilirubin Urine: NEGATIVE
Nitrite: NEGATIVE
Total Protein, Urine: NEGATIVE mg/dL
Urine Glucose: NEGATIVE mg/dL

## 2008-07-01 ENCOUNTER — Ambulatory Visit: Payer: Self-pay | Admitting: Cardiology

## 2008-07-01 ENCOUNTER — Encounter: Payer: Self-pay | Admitting: Pulmonary Disease

## 2008-07-09 ENCOUNTER — Ambulatory Visit: Payer: Self-pay | Admitting: Cardiovascular Disease

## 2008-07-15 ENCOUNTER — Ambulatory Visit: Payer: Self-pay | Admitting: Pulmonary Disease

## 2008-07-15 ENCOUNTER — Telehealth: Payer: Self-pay | Admitting: Pulmonary Disease

## 2008-07-15 ENCOUNTER — Ambulatory Visit: Payer: Self-pay | Admitting: Cardiology

## 2008-07-19 LAB — CONVERTED CEMR LAB
Bilirubin Urine: NEGATIVE
Ketones, ur: NEGATIVE mg/dL
Mucus, UA: NEGATIVE
Total Protein, Urine: NEGATIVE mg/dL
pH: 7 (ref 5.0–8.0)

## 2008-07-30 ENCOUNTER — Ambulatory Visit: Payer: Self-pay | Admitting: Cardiovascular Disease

## 2008-08-03 ENCOUNTER — Ambulatory Visit: Payer: Self-pay | Admitting: Cardiovascular Disease

## 2008-08-12 ENCOUNTER — Encounter: Payer: Self-pay | Admitting: Pulmonary Disease

## 2008-08-19 ENCOUNTER — Encounter: Payer: Self-pay | Admitting: Pulmonary Disease

## 2008-08-27 ENCOUNTER — Ambulatory Visit: Payer: Self-pay | Admitting: Cardiology

## 2008-09-08 ENCOUNTER — Ambulatory Visit: Payer: Self-pay | Admitting: Pulmonary Disease

## 2008-09-17 ENCOUNTER — Encounter: Payer: Self-pay | Admitting: Pulmonary Disease

## 2008-09-24 ENCOUNTER — Ambulatory Visit: Payer: Self-pay | Admitting: Cardiology

## 2008-10-25 ENCOUNTER — Ambulatory Visit: Payer: Self-pay | Admitting: Cardiovascular Disease

## 2008-10-29 ENCOUNTER — Ambulatory Visit: Payer: Self-pay | Admitting: Pulmonary Disease

## 2008-11-15 ENCOUNTER — Encounter: Payer: Self-pay | Admitting: Pulmonary Disease

## 2008-11-22 ENCOUNTER — Ambulatory Visit: Payer: Self-pay | Admitting: Cardiovascular Disease

## 2008-11-29 ENCOUNTER — Ambulatory Visit: Payer: Self-pay | Admitting: Internal Medicine

## 2008-11-29 ENCOUNTER — Ambulatory Visit: Payer: Self-pay | Admitting: Cardiovascular Disease

## 2008-12-22 ENCOUNTER — Ambulatory Visit: Payer: Self-pay | Admitting: Cardiovascular Disease

## 2008-12-22 LAB — CONVERTED CEMR LAB
BUN: 21 mg/dL (ref 6–23)
CO2: 28 meq/L (ref 19–32)
Chloride: 106 meq/L (ref 96–112)
Creatinine, Ser: 0.6 mg/dL (ref 0.4–1.2)

## 2008-12-23 DIAGNOSIS — E538 Deficiency of other specified B group vitamins: Secondary | ICD-10-CM

## 2008-12-23 DIAGNOSIS — E739 Lactose intolerance, unspecified: Secondary | ICD-10-CM | POA: Insufficient documentation

## 2009-01-12 ENCOUNTER — Ambulatory Visit: Payer: Self-pay | Admitting: Cardiology

## 2009-01-19 ENCOUNTER — Encounter: Payer: Self-pay | Admitting: Cardiovascular Disease

## 2009-01-19 ENCOUNTER — Ambulatory Visit: Payer: Self-pay

## 2009-01-27 ENCOUNTER — Encounter: Payer: Self-pay | Admitting: Cardiovascular Disease

## 2009-01-27 ENCOUNTER — Ambulatory Visit: Payer: Self-pay | Admitting: Cardiovascular Disease

## 2009-02-09 ENCOUNTER — Ambulatory Visit: Payer: Self-pay | Admitting: Cardiology

## 2009-02-28 ENCOUNTER — Ambulatory Visit: Payer: Self-pay | Admitting: Cardiology

## 2009-03-24 ENCOUNTER — Encounter: Payer: Self-pay | Admitting: Cardiovascular Disease

## 2009-03-28 ENCOUNTER — Ambulatory Visit: Payer: Self-pay | Admitting: Internal Medicine

## 2009-04-18 ENCOUNTER — Ambulatory Visit: Payer: Self-pay | Admitting: Pulmonary Disease

## 2009-04-22 ENCOUNTER — Ambulatory Visit: Payer: Self-pay | Admitting: Internal Medicine

## 2009-04-22 LAB — CONVERTED CEMR LAB
POC INR: 2.2
Protime: 18.1

## 2009-04-24 LAB — CONVERTED CEMR LAB
ALT: 16 units/L (ref 0–35)
AST: 23 units/L (ref 0–37)
Alkaline Phosphatase: 99 units/L (ref 39–117)
BUN: 17 mg/dL (ref 6–23)
Bilirubin, Direct: 0.2 mg/dL (ref 0.0–0.3)
Cholesterol: 136 mg/dL (ref 0–200)
Eosinophils Relative: 2.6 % (ref 0.0–5.0)
GFR calc non Af Amer: 102.74 mL/min (ref >60)
HCT: 36.1 % (ref 36.0–46.0)
LDL Cholesterol: 74 mg/dL (ref 0–99)
Lymphocytes Relative: 19.9 % (ref 12.0–46.0)
Lymphs Abs: 2.1 10*3/uL (ref 0.7–4.0)
Monocytes Relative: 8.4 % (ref 3.0–12.0)
Neutrophils Relative %: 68.1 % (ref 43.0–77.0)
Platelets: 318 10*3/uL (ref 150.0–400.0)
Potassium: 4.5 meq/L (ref 3.5–5.1)
Sodium: 142 meq/L (ref 135–145)
Total Bilirubin: 1 mg/dL (ref 0.3–1.2)
VLDL: 25.4 mg/dL (ref 0.0–40.0)
WBC: 10.6 10*3/uL — ABNORMAL HIGH (ref 4.5–10.5)

## 2009-04-26 ENCOUNTER — Encounter: Payer: Self-pay | Admitting: *Deleted

## 2009-05-20 ENCOUNTER — Ambulatory Visit: Payer: Self-pay | Admitting: Cardiology

## 2009-05-20 LAB — CONVERTED CEMR LAB
POC INR: 2.9
Prothrombin Time: 20.4 s

## 2009-06-01 ENCOUNTER — Encounter: Payer: Self-pay | Admitting: *Deleted

## 2009-06-21 ENCOUNTER — Ambulatory Visit: Payer: Self-pay | Admitting: Internal Medicine

## 2009-06-21 LAB — CONVERTED CEMR LAB: POC INR: 1.7

## 2009-06-30 ENCOUNTER — Ambulatory Visit: Payer: Self-pay | Admitting: Cardiology

## 2009-07-14 ENCOUNTER — Ambulatory Visit: Payer: Self-pay | Admitting: Cardiology

## 2009-07-20 ENCOUNTER — Encounter (INDEPENDENT_AMBULATORY_CARE_PROVIDER_SITE_OTHER): Payer: Self-pay | Admitting: *Deleted

## 2009-07-29 ENCOUNTER — Ambulatory Visit: Payer: Self-pay | Admitting: Internal Medicine

## 2009-08-16 ENCOUNTER — Ambulatory Visit: Payer: Self-pay | Admitting: Internal Medicine

## 2009-08-29 ENCOUNTER — Telehealth: Payer: Self-pay | Admitting: Internal Medicine

## 2009-09-14 ENCOUNTER — Ambulatory Visit: Payer: Self-pay | Admitting: Internal Medicine

## 2009-09-14 LAB — CONVERTED CEMR LAB: POC INR: 2.2

## 2009-10-12 ENCOUNTER — Ambulatory Visit: Payer: Self-pay | Admitting: Cardiovascular Disease

## 2009-10-12 LAB — CONVERTED CEMR LAB: POC INR: 2.2

## 2009-10-17 ENCOUNTER — Ambulatory Visit: Payer: Self-pay | Admitting: Pulmonary Disease

## 2009-11-03 ENCOUNTER — Ambulatory Visit: Payer: Self-pay | Admitting: Cardiovascular Disease

## 2009-11-03 LAB — CONVERTED CEMR LAB: POC INR: 2.3

## 2009-11-24 ENCOUNTER — Encounter: Admission: RE | Admit: 2009-11-24 | Discharge: 2009-11-24 | Payer: Self-pay | Admitting: Neurology

## 2009-12-12 ENCOUNTER — Telehealth: Payer: Self-pay | Admitting: Cardiovascular Disease

## 2009-12-14 ENCOUNTER — Ambulatory Visit: Payer: Self-pay | Admitting: Cardiovascular Disease

## 2009-12-14 LAB — CONVERTED CEMR LAB: POC INR: 1.7

## 2009-12-15 ENCOUNTER — Telehealth: Payer: Self-pay | Admitting: Pulmonary Disease

## 2009-12-26 ENCOUNTER — Ambulatory Visit: Payer: Self-pay | Admitting: Cardiovascular Disease

## 2009-12-26 ENCOUNTER — Ambulatory Visit: Payer: Self-pay | Admitting: Cardiology

## 2009-12-26 LAB — CONVERTED CEMR LAB: POC INR: 1.6

## 2009-12-28 LAB — CONVERTED CEMR LAB
Calcium: 9 mg/dL (ref 8.4–10.5)
Creatinine, Ser: 0.9 mg/dL (ref 0.4–1.2)
GFR calc non Af Amer: 64.23 mL/min (ref >60)
Sodium: 139 meq/L (ref 135–145)

## 2010-01-04 ENCOUNTER — Ambulatory Visit: Payer: Self-pay | Admitting: Cardiology

## 2010-01-23 ENCOUNTER — Encounter (INDEPENDENT_AMBULATORY_CARE_PROVIDER_SITE_OTHER): Payer: Self-pay | Admitting: Cardiology

## 2010-01-23 ENCOUNTER — Ambulatory Visit: Payer: Self-pay | Admitting: Cardiology

## 2010-01-23 LAB — CONVERTED CEMR LAB: POC INR: 1.9

## 2010-01-27 ENCOUNTER — Encounter (INDEPENDENT_AMBULATORY_CARE_PROVIDER_SITE_OTHER): Payer: Self-pay | Admitting: *Deleted

## 2010-03-08 ENCOUNTER — Ambulatory Visit: Payer: Self-pay | Admitting: Internal Medicine

## 2010-03-08 LAB — CONVERTED CEMR LAB: POC INR: 1.7

## 2010-03-28 ENCOUNTER — Telehealth: Payer: Self-pay | Admitting: Cardiovascular Disease

## 2010-03-29 ENCOUNTER — Ambulatory Visit: Payer: Self-pay | Admitting: Cardiology

## 2010-04-17 ENCOUNTER — Ambulatory Visit: Payer: Self-pay | Admitting: Pulmonary Disease

## 2010-04-17 ENCOUNTER — Ambulatory Visit: Payer: Self-pay | Admitting: Cardiovascular Disease

## 2010-04-17 ENCOUNTER — Ambulatory Visit: Payer: Self-pay | Admitting: Cardiology

## 2010-04-17 DIAGNOSIS — R079 Chest pain, unspecified: Secondary | ICD-10-CM | POA: Insufficient documentation

## 2010-04-18 LAB — CONVERTED CEMR LAB
AST: 22 units/L (ref 0–37)
Albumin: 4.3 g/dL (ref 3.5–5.2)
Alkaline Phosphatase: 65 units/L (ref 39–117)
BUN: 19 mg/dL (ref 6–23)
Basophils Absolute: 0 10*3/uL (ref 0.0–0.1)
CO2: 29 meq/L (ref 19–32)
Calcium: 9 mg/dL (ref 8.4–10.5)
Cholesterol: 177 mg/dL (ref 0–200)
Eosinophils Relative: 3.9 % (ref 0.0–5.0)
GFR calc non Af Amer: 87.21 mL/min (ref >60)
Glucose, Bld: 107 mg/dL — ABNORMAL HIGH (ref 70–99)
HCT: 40.2 % (ref 36.0–46.0)
HDL: 50.6 mg/dL (ref >39.00)
Lymphocytes Relative: 26.5 % (ref 12.0–46.0)
Lymphs Abs: 2.1 10*3/uL (ref 0.7–4.0)
Monocytes Relative: 8.3 % (ref 3.0–12.0)
Platelets: 258 10*3/uL (ref 150.0–400.0)
Potassium: 4.8 meq/L (ref 3.5–5.1)
RDW: 13 % (ref 11.5–14.6)
TSH: 0.31 microintl units/mL — ABNORMAL LOW (ref 0.35–5.50)
Total Protein: 7.2 g/dL (ref 6.0–8.3)
Triglycerides: 167 mg/dL — ABNORMAL HIGH (ref 0.0–149.0)
VLDL: 33.4 mg/dL (ref 0.0–40.0)
Vitamin B-12: 506 pg/mL (ref 211–911)
WBC: 7.8 10*3/uL (ref 4.5–10.5)

## 2010-04-19 ENCOUNTER — Telehealth (INDEPENDENT_AMBULATORY_CARE_PROVIDER_SITE_OTHER): Payer: Self-pay | Admitting: *Deleted

## 2010-04-20 ENCOUNTER — Encounter (HOSPITAL_COMMUNITY): Admission: RE | Admit: 2010-04-20 | Discharge: 2010-06-28 | Payer: Self-pay | Admitting: Cardiovascular Disease

## 2010-04-20 ENCOUNTER — Ambulatory Visit: Payer: Self-pay | Admitting: Cardiology

## 2010-04-20 ENCOUNTER — Ambulatory Visit: Payer: Self-pay | Admitting: Internal Medicine

## 2010-04-20 ENCOUNTER — Ambulatory Visit: Payer: Self-pay

## 2010-04-20 DIAGNOSIS — R9439 Abnormal result of other cardiovascular function study: Secondary | ICD-10-CM

## 2010-04-25 ENCOUNTER — Ambulatory Visit: Payer: Self-pay | Admitting: Internal Medicine

## 2010-04-25 ENCOUNTER — Inpatient Hospital Stay (HOSPITAL_BASED_OUTPATIENT_CLINIC_OR_DEPARTMENT_OTHER): Admission: RE | Admit: 2010-04-25 | Discharge: 2010-04-25 | Payer: Self-pay | Admitting: Cardiology

## 2010-04-25 ENCOUNTER — Ambulatory Visit: Payer: Self-pay | Admitting: Cardiovascular Disease

## 2010-05-02 ENCOUNTER — Ambulatory Visit: Payer: Self-pay | Admitting: Internal Medicine

## 2010-05-03 ENCOUNTER — Ambulatory Visit: Payer: Self-pay | Admitting: Cardiology

## 2010-05-03 LAB — CONVERTED CEMR LAB: POC INR: 1.8

## 2010-05-10 ENCOUNTER — Ambulatory Visit: Payer: Self-pay

## 2010-05-10 ENCOUNTER — Ambulatory Visit (HOSPITAL_COMMUNITY): Admission: RE | Admit: 2010-05-10 | Discharge: 2010-05-10 | Payer: Self-pay | Admitting: Cardiovascular Disease

## 2010-05-10 ENCOUNTER — Ambulatory Visit: Payer: Self-pay | Admitting: Cardiovascular Disease

## 2010-05-10 ENCOUNTER — Encounter: Payer: Self-pay | Admitting: Cardiovascular Disease

## 2010-05-10 ENCOUNTER — Ambulatory Visit: Payer: Self-pay | Admitting: Cardiology

## 2010-05-12 ENCOUNTER — Ambulatory Visit: Payer: Self-pay | Admitting: Cardiology

## 2010-05-12 LAB — CONVERTED CEMR LAB: POC INR: 2.3

## 2010-05-25 ENCOUNTER — Ambulatory Visit: Payer: Self-pay | Admitting: Internal Medicine

## 2010-05-25 HISTORY — PX: COLONOSCOPY: SHX174

## 2010-06-01 ENCOUNTER — Ambulatory Visit: Payer: Self-pay | Admitting: Internal Medicine

## 2010-06-01 LAB — CONVERTED CEMR LAB: POC INR: 1.8

## 2010-06-02 ENCOUNTER — Encounter: Payer: Self-pay | Admitting: Internal Medicine

## 2010-06-22 ENCOUNTER — Ambulatory Visit: Payer: Self-pay | Admitting: Internal Medicine

## 2010-06-22 LAB — CONVERTED CEMR LAB: POC INR: 2.3

## 2010-06-29 ENCOUNTER — Ambulatory Visit: Payer: Self-pay | Admitting: Cardiovascular Disease

## 2010-07-20 ENCOUNTER — Ambulatory Visit: Payer: Self-pay | Admitting: Internal Medicine

## 2010-07-20 LAB — CONVERTED CEMR LAB: POC INR: 2.4

## 2010-07-23 ENCOUNTER — Emergency Department (HOSPITAL_COMMUNITY): Admission: EM | Admit: 2010-07-23 | Discharge: 2010-07-23 | Payer: Self-pay | Admitting: Emergency Medicine

## 2010-08-17 ENCOUNTER — Ambulatory Visit: Payer: Self-pay | Admitting: Internal Medicine

## 2010-08-17 LAB — CONVERTED CEMR LAB: POC INR: 2.8

## 2010-09-14 ENCOUNTER — Ambulatory Visit: Payer: Self-pay | Admitting: Internal Medicine

## 2010-09-14 LAB — CONVERTED CEMR LAB: POC INR: 2

## 2010-10-12 ENCOUNTER — Ambulatory Visit: Payer: Self-pay | Admitting: Cardiology

## 2010-10-16 ENCOUNTER — Ambulatory Visit: Payer: Self-pay | Admitting: Pulmonary Disease

## 2010-10-18 LAB — CONVERTED CEMR LAB
BUN: 19 mg/dL (ref 6–23)
CO2: 26 meq/L (ref 19–32)
Calcium: 9 mg/dL (ref 8.4–10.5)
GFR calc non Af Amer: 73.43 mL/min (ref >60)
Glucose, Bld: 105 mg/dL — ABNORMAL HIGH (ref 70–99)
Sodium: 140 meq/L (ref 135–145)

## 2010-10-29 DIAGNOSIS — K5730 Diverticulosis of large intestine without perforation or abscess without bleeding: Secondary | ICD-10-CM | POA: Insufficient documentation

## 2010-10-29 DIAGNOSIS — D126 Benign neoplasm of colon, unspecified: Secondary | ICD-10-CM | POA: Insufficient documentation

## 2010-10-30 ENCOUNTER — Ambulatory Visit: Payer: Self-pay | Admitting: Cardiovascular Disease

## 2010-11-08 LAB — CONVERTED CEMR LAB: POC INR: 2

## 2010-11-09 ENCOUNTER — Ambulatory Visit: Payer: Self-pay | Admitting: Cardiology

## 2010-11-23 ENCOUNTER — Telehealth (INDEPENDENT_AMBULATORY_CARE_PROVIDER_SITE_OTHER): Payer: Self-pay | Admitting: *Deleted

## 2010-11-30 ENCOUNTER — Ambulatory Visit: Admission: RE | Admit: 2010-11-30 | Discharge: 2010-11-30 | Payer: Self-pay | Source: Home / Self Care

## 2010-11-30 LAB — CONVERTED CEMR LAB: POC INR: 2.2

## 2010-12-16 ENCOUNTER — Encounter: Payer: Self-pay | Admitting: Pulmonary Disease

## 2010-12-17 ENCOUNTER — Encounter: Payer: Self-pay | Admitting: Cardiovascular Disease

## 2010-12-28 NOTE — Procedures (Signed)
Summary: Colonoscopy/[place]  Colonoscopy/[place]   Imported By: Sherian Rein 05/04/2010 07:43:12  _____________________________________________________________________  External Attachment:    Type:   Image     Comment:   External Document

## 2010-12-28 NOTE — Medication Information (Signed)
Summary: [name]  Anticoagulant Therapy  Managed by: Bethena Midget, RN, BSN Referring MD: Charlton Haws, MD PCP: Alroy Dust, MD  Supervising MD: Gala Romney MD, Reuel Boom Indication 1: Atrial Fibrillation (ICD-427.31) Indication 2: delete Lab Used: [facility] Site: [address] INR POC 2.8 INR RANGE 2 - 3  Dietary changes: no    Health status changes: no    Bleeding/hemorrhagic complications: no    Recent/future hospitalizations: no    Any changes in medication regimen? no    Recent/future dental: no  Any missed doses?: no       Is patient compliant with meds? yes       Allergies: 1)  ! Codeine  Anticoagulation Management History:      The patient is taking warfarin and comes in today for a routine follow up visit.  Positive risk factors for bleeding include an age of 75 years or older.  The bleeding index is 'intermediate risk'.  Positive CHADS2 values include History of HTN and Age > 83 years old.  The start date was 04/01/1998.  Her last INR was 2.2 RATIO.  Anticoagulation responsible provider: Estle Sabella MD, Reuel Boom.  INR POC: 2.8.  Cuvette Lot#: [id number].  Exp: 09/2011.    Anticoagulation Management Assessment/Plan:      The patient's current anticoagulation dose is Warfarin sodium 5 mg tabs: Use as directed by Anticoagulation Clinic.  The target INR is 2 - 3.  The next INR is due 09/14/2010.  Anticoagulation instructions were given to patient.  Results were reviewed/authorized by Bethena Midget, RN, BSN.  She was notified by Bethena Midget, RN, BSN.         Prior Anticoagulation Instructions: INR 2.4  Continue taking 1 tablet (5mg ) every day except take 1.5 tablets (7.5mg ) on Mondays, Wednesdays, and Fridays.  Recheck in 4 weeks.   Current Anticoagulation Instructions: INR 2.8 Continue 5mg s daily except 7.5mg s on Mondays, Wednesdays and Fridays. Recheck in 4 weeks.

## 2010-12-28 NOTE — Procedures (Signed)
Summary: [facility]   Imported By: Sherian Rein 05/04/2010 07:45:54  _____________________________________________________________________  External Attachment:    Type:   Image     Comment:   External Document

## 2010-12-28 NOTE — Progress Notes (Signed)
Summary: Nuclear Pre-Procedure  Phone Note Outgoing Call Call back at [facility] Phone [phone]   Call placed by: Stanton Kidney, EMT-P,  Apr 19, 2010 2:32 PM Action Taken: Phone Call Completed Summary of Call: Left message with information on Myoview Information Sheet (see scanned document for details).     Nuclear Med Background Indications for Stress Test: Evaluation for Ischemia   History: Echo, Myocardial Perfusion Study  History Comments: 11/05 MPS: EF=79%, (-) ischemia, mild apical thinning 2/10 Echo: EF= 55% H/O A. Fib  Symptoms: Chest Pain, Light-Headedness    Nuclear Pre-Procedure Cardiac Risk Factors: Family History - CAD, Hypertension, Lipids, PVD Height (in): 71  Nuclear Med Study Referring MD:  Charlton Haws MD

## 2010-12-28 NOTE — Assessment & Plan Note (Signed)
Summary: 6 months/apc   Primary Care Provider:  Alroy Dust, MD   CC:  6 month ROV & review of mult medical problems....  History of Present Illness: 77 y/o WF here for a follow up visit... she has multiple medical problems as listed below...    ~  October 17, 2009:  she has had a good 6 mo... today c/o sl sore throat, scratchy, no f/c/s, no drainage/ phlegm, etc... she is holding up well w/ considerable stress w/ husb illness (DrJacobs & [facility]- stent in bile duct & 9 biopsies- all neg, but they feel he has Ca)... [facility]... we reviewed her labs from 5/10...   ~  Apr 17, 2010:  she is under increasing stress w/ husb's illness- prob pancreatic Ca, followed at [facility], he's lost >140#, etc... saw Walker Kehr for her HBP, edema, AFib on coumadin> stable, she takes extra Accupril if BP up, sched for Myoview soon...  she is due for f/u CXR (clear, NAD); fasting labs (OK- see below); & needs colonoscopy (sched w/ DrPerry for 05/02/10)...   ~  11/03/10:   her husb passed away 07-04-10 then incr stress w/ his daughter, she has Therapist, sports for Prn use... she had abn Myoview 5/11, then cath 6/11 w/ essent norm coronaries, & 2DEcho showing norm LV, dilated LA & RV, AoV sclerosis & mild AI, MV thickening & mild MS & mod MR... she saw Walker Kehr 8/11- no changes made... also had colon 6/11 by drPerry- divertics, 2 sm polyps, prev right hemicolectomy- path= tubular adenoma & f/u planned 79yrs.   Current Problem List:  HYPERTENSION (ICD-401.9) - controlled on COREG 3.125mg Bid, ACCUPRIL 20mg /d, LASIX 40mg /d, ALDACTONE 25mg /d & K20- 2tabsBid... BP= 132/80 today and similar at home unless stressed... denies HA, fatigue, visual changes, CP, palipit, dizziness, syncope, dyspnea, edema, etc...  ~  labs 9/09 at Neuro showed K= 4.3, BUN= 15, Creat= 0.6  ~  labs 1/10 showed K= 4.8, BUN= 21, Creat= 0.6  ~  labs 5/10 showed K= 4.5, BUN= 17, Creat= 0.6  ~  labs 5/11 showed K= 4.8, BUN=  19, Creat= 0.7; CXR- cardiomeg, mild hyperinflation, clear, NAD.  ATRIAL FIBRILLATION (ICD-427.31) - on Coumadin per cardiology & followed in the Coumadin Clinic... MITRAL VALVE DISORDER (ICD-424.0) - followed by Walker Kehr... ? of PATENT FORAMEN OVALE (ICD-745.5)  ~  NuclearStressTest 11/05 showed norm perfusion, mild apic thinning, no ischemia, EF=79%...  ~  2DEcho 9/08 showed norm LVF, EF=55-60%, no regional wall motion abn, mod thickening of MV, mod MR, mod dil LA, ?PFO vs small ASD.Marland Kitchen. (prev echo's w/ mild MS)...  ~  2DEcho 2/10 showed norm LVF w/o regional wall motion abn & EF= 55%, mild MS & mod MR (Rheumatic deformity), mod dil LA & RA, etc...  ~  Myoview 5/11 showed  +ischemia & ST depression, EF= 77%  ~  Cath 6/11 showed essent norm coronaries (min LAD plaque), dcalcif AoV, norm LVF, MR...  ~  2DEcho 6/11 showed norm LVF, mild LVH, dil RV w/ mild decr RVF, dil LA, mild MV thickening w/ mild MS & mod MR, AoV sclerosis & no stenosis.  PERIPHERAL VASCULAR DISEASE (ICD-443.9) - tortuous Ao on CXR...   ~  CTAngio Abd&Legs 7/08 showed mild to mod right RAS, no signif PVD, mild run-off disease in legs...  VENOUS INSUFFICIENCY (ICD-459.81) - on LASIX 40mg /d, ALDACTONE 25mg /d, + KCl - 2Bid... she follows a low sodium diet, elevates, support hose...  HYPERLIPIDEMIA (ICD-272.4) -  on LIPITOR 20mg /d + diet Rx...   ~  FLP 5/08 showed TChol 100, TG 65, HDL 40, LDL 47  ~  FLP 3/09 showed TChol 114, TG 85, HDL 35, LDL 62  ~  FLP 9/09 at Neuro showed TChol 125, TG 118, HDL 34, LDL 67  ~  FLP 5/10 showed TChol 136, TG 127, HDL 36, LDL 74  ~  FLP 5/11 showed TChol 177, TG 167, HDL 51, LDL 93  IMPAIRED GLUCOSE TOLERANCE (ICD-271.3) - on diet  alone> see eval by Sula Soda, Neurology...  ~  GTT 9/09 showed FBS= 101, 1hr BS= 216, A1c= 6.4  ~  labs 5/10 showed BS= 97, A1c= 6.7  ~  labs 5/11 showed BS= 107, A1c= 6.5  HYPOTHYROIDISM (ICD-244.9) - on SYNTHROID 160mcg/d...  ~  TSH 6/08 was normal at 2.95   ~  TSH 3/09 was 2.18  ~  labs 9/09 at Neuro showed TSH= 0.62  ~  labs 5/10 showed TSH= 0.71  ~  labs 5/11 showed TSH= 0.31  Hx of COLON CANCER (ICD-153.9) - s/p right hemicolectomy in 1994... no known recurrence.  ~  f/u colonoscopy 05/02/10 w/ DrPerry showed divertics, 2 sm polyps= tubular adenoma, f/u planned 14yrs.  Hx of SPLENIC INFARCTION (ICD-289.59) - eval for left side pain 6/08 w/ CTAbd showing splenic infarct (she was on a coumadin alternative study drug at that time)... switched to coumadin at that time...  UTERINE CANCER, HX OF (ICD-V10.42) - s/p hysterectomy, subseq AP repair 5/07 DrNeal...  Hx of URINARY TRACT INFECTION (ICD-599.0)  OSTEOARTHRITIS (ICD-715.90) - known hand, shoulder, knee and CSpine arthritis as noted... takes MOBIC & TRAMODOL Prn... she requested appt at [facility] DrO'Rourke- seen 4/09 and he concurred w/ Mobic/ Tramadol for her Osteoarthritis...  ~  prev eval DrDaldorf for Ortho 12/08 & 12/09 w/ left hand osteoarth (injected her middle finger PIP), right elbow DJD (improved after injection), left knee torn medial meniscus on MRI (improved after injection), and hx lumbar spinal stenosis treated by injection in the past...  ~  XRay left hand 02/16/08 showed DJD, no fractures...  ~  Labs 3/09 showed  Hg=12.5, WBC=10.1, BMet=norm w/ K=4.3, sed=41, RA=neg, ANA=neg...  ~ Additional labs 9/09 at [facility]- reviewed.  LOW BACK PAIN, CHRONIC (ICD-724.2) - s/p lumbar laminectomy L3-4 for sp stenosis 2006 by DrCram...   PERIPHERAL NEUROPATHY (ICD-356.9) - eval & Rx by DrYan in 2009> NCV's showed polyneuropathy: w/ abn GTT (1hr BS= 216, HgA1c= 6.4) and B12 level= 254... placed on low carb diet & started on B12 shots, along w/ 5% Lidocaine cream to apply to feet at night...  OSTEOPOROSIS (ICD-733.00) - she has osteoporosis w/ partial compression...  ANXIETY (ICD-300.00) - on ALPRAZOLAM 0.25mg  Prn...  VITAMIN B12 DEFICIENCY (ICD-266.2) - eval by DrYan 9/09 showed B12 level= 254  (211-911)... started on B12 shots which she took for about 57mo & switched to OTC Vit B12 1000 daily...  ~  labs 5/11 showed Vit B12 level = 506 (211-911)   Preventive Screening-Counseling & Management  Alcohol-Tobacco     Smoking Status: never  Caffeine-Diet-Exercise     Exercise (avg: min/session): 4:15  Allergies: 1)  ! Codeine  Comments:  Nurse/Medical Assistant: The patient's medications and allergies were reviewed with the patient and were updated in the Medication and Allergy Lists.  Past History:  Past Medical History: HYPERTENSION (ICD-401.9) CHEST PAIN UNSPECIFIED (ICD-786.50) ATRIAL FIBRILLATION (ICD-427.31) MITRAL VALVE DISORDER (ICD-424.0) ? of PATENT FORAMEN OVALE (ICD-745.5) PERIPHERAL VASCULAR DISEASE (ICD-443.9) VENOUS INSUFFICIENCY (ICD-459.81)  HYPERLIPIDEMIA (ICD-272.4) IMPAIRED GLUCOSE TOLERANCE (ICD-271.3) HYPOTHYROIDISM (ICD-244.9) DIVERTICULOSIS OF COLON (ICD-562.10) COLONIC POLYPS (ICD-211.3) Hx of COLON CANCER (ICD-153.9) Hx of SPLENIC INFARCTION (ICD-289.59) UTERINE CANCER, HX OF (ICD-V10.42) Hx of URINARY TRACT INFECTION (ICD-599.0) OSTEOARTHRITIS (ICD-715.90) LOW BACK PAIN, CHRONIC (ICD-724.2) OSTEOPOROSIS (ICD-733.00) PERIPHERAL NEUROPATHY (ICD-356.9) ANXIETY (ICD-300.00) VITAMIN B12 DEFICIENCY (ICD-266.2)  Past Surgical History: S/P hysterectomy for uterine cancer S/P AP repair 5/07 by Latanya Presser S/P basal cell skin cancer Moh's surgery S/P right hemicolectomy for colon cancer - 1994 S/P lumbar laminectomy for spinal stenosis L3-4 in 2006 S/P left carpal tunnel surgery 4/08 by DrSypher  Family History: Reviewed history from 06/22/2008 and no changes required. mother deceased at age 89  from cancer father deceased at age 67  from heart failure 1 brother deceased age 39 cancer 1 sister deceased age 54 stroke and heart problems  Social History: Reviewed history from 05/02/2010 and no changes required. Retired---part time  [place] married no children social etoh  Review of Systems      See HPI       The patient complains of dyspnea on exertion and peripheral edema.  The patient denies anorexia, fever, weight loss, weight gain, vision loss, decreased hearing, hoarseness, chest pain, syncope, prolonged cough, headaches, hemoptysis, abdominal pain, melena, hematochezia, severe indigestion/heartburn, hematuria, incontinence, muscle weakness, suspicious skin lesions, transient blindness, difficulty walking, depression, unusual weight change, abnormal bleeding, enlarged lymph nodes, and angioedema.    Vital Signs:  Patient profile:   77 year old female Height:      71 inches Weight:      186.38 pounds BMI:     26.09 O2 Sat:      98 % on Room air Temp:     98.9 degrees F oral Pulse rate:   68 / minute BP sitting:   132 / 80  (right arm) Cuff size:   regular  Vitals Entered By: Randell Loop CMA (October 16, 2010 10:26 AM)  O2 Sat at Rest %:  98 O2 Flow:  Room air CC: 6 month ROV & review of mult medical problems... Is Patient Diabetic? No Pain Assessment Patient in pain? yes      Onset of pain  in legs at night --lots of cramps Comments no changes in meds today   Physical Exam  Additional Exam:  WD, WN, 77 y/o WF in NAD... GENERAL:  Alert & oriented; pleasant & cooperative... HEENT:  [place]/AT, EOM-wnl, PERRLA, EACs-clear, TMs-wnl, NOSE-clear, THROAT-clear & wnl. NECK:  Supple w/ fair ROM; no JVD; normal carotid impulses w/o bruits; no thyromegaly or nodules palpated; no lymphadenopathy. CHEST:  Clear to P & A; without wheezes/ rales/ or rhonchi. HEART:  Regular rhythm, gr 1/6 SEM; without rubs/ or gallops heard... ABDOMEN:  Soft & nontender; normal bowel sounds; no organomegaly or masses detected. EXT: without deformities, mod arthritic changes; no varicose veins/ +venous insuffic/ tr edema. NEURO:  CN's intact;  no focal neuro deficits x distal neuropathy symptoms... DERM:  No lesions noted; no rash  etc...    MISC. Report  Procedure date:  10/16/2010  Findings:      DATA REVIEWED:  ~  Myoview 5/-26/11;  Cath 04/26/10;  2DEcho 05/10/10;  Colonoscopy 05/25/10;  EMR DrNishan 06/29/10...   Impression & Recommendations:  Problem # 1:  HYPERTENSION (ICD-401.9) Controlled>  same meds. Her updated medication list for this problem includes:    Coreg 3.125 Mg Tabs (Carvedilol) .Marland Kitchen... Take 1 by mouth two times a day    Accupril 20 Mg Tabs (Quinapril hcl) .Marland KitchenMarland KitchenMarland KitchenMarland Kitchen  1 tab once daily    Lasix 40 Mg Tabs (Furosemide) .Marland Kitchen... Take 1 tablet by mouth once a day    Spironolactone 25 Mg Tabs (Spironolactone) .Marland Kitchen... 1 tab by mouth once daily  Orders: TLB-BMP (Basic Metabolic Panel-BMET) (80048-METABOL)  Problem # 2:  CHEST PAIN UNSPECIFIED (ICD-786.50) Data reviewed>  continue current meds, incr exercise, etc...  Problem # 3:  MITRAL VALVE DISORDER (ICD-424.0) 2DEcho reviewed & DrNishan's note 8/11>  continue same meds + coumadin via CC... Her updated medication list for this problem includes:    Warfarin Sodium 5 Mg Tabs (Warfarin sodium) ..... Use as directed by anticoagulation clinic    Coreg 3.125 Mg Tabs (Carvedilol) .Marland Kitchen... Take 1 by mouth two times a day  Problem # 4:  ATRIAL FIBRILLATION (ICD-427.31) On Coumadin via the CC... Her updated medication list for this problem includes:    Warfarin Sodium 5 Mg Tabs (Warfarin sodium) ..... Use as directed by anticoagulation clinic    Coreg 3.125 Mg Tabs (Carvedilol) .Marland Kitchen... Take 1 by mouth two times a day  Problem # 5:  HYPERLIPIDEMIA (ICD-272.4) Stable on Lip20>  plan f/u FLP on ret. Her updated medication list for this problem includes:    Lipitor 20 Mg Tabs (Atorvastatin calcium) .Marland Kitchen... Take 1 by mouth at bedtime  Problem # 6:  IMPAIRED GLUCOSE TOLERANCE (ICD-271.3) Stable on diet alone...  Problem # 7:  HYPOTHYROIDISM (ICD-244.9) Stable on Synthroid... Her updated medication list for this problem includes:    Synthroid 125 Mcg Tabs  (Levothyroxine sodium) .Marland Kitchen... Take 1 by mouth once daily  Problem # 8:  OSTEOARTHRITIS (ICD-715.90) Stable on Mobic etc... Her updated medication list for this problem includes:    Mobic 7.5 Mg Tabs (Meloxicam) .Marland Kitchen... Take 1 tablet by mouth once a day  Problem # 9:  PERIPHERAL NEUROPATHY (ICD-356.9) She still follows up w/ DrYan...  Problem # 10:  VITAMIN B12 DEFICIENCY (ICD-266.2) Improved o oral B12 supplement...  Complete Medication List: 1)  Warfarin Sodium 5 Mg Tabs (Warfarin sodium) .... Use as directed by anticoagulation clinic 2)  Coreg 3.125 Mg Tabs (Carvedilol) .... Take 1 by mouth two times a day 3)  Accupril 20 Mg Tabs (Quinapril hcl) .Marland Kitchen.. 1 tab once daily 4)  Lasix 40 Mg Tabs (Furosemide) .... Take 1 tablet by mouth once a day 5)  Spironolactone 25 Mg Tabs (Spironolactone) .Marland Kitchen.. 1 tab by mouth once daily 6)  Klor-con 20 Meq Pack (Potassium chloride) .... Take 2 tabs by mouth two times a day... 7)  Lipitor 20 Mg Tabs (Atorvastatin calcium) .... Take 1 by mouth at bedtime 8)  Synthroid 125 Mcg Tabs (Levothyroxine sodium) .... Take 1 by mouth once daily 9)  Climara 0.1 Mg/24hr Ptwk (Estradiol) .Marland Kitchen.. 1 every week 10)  Mobic 7.5 Mg Tabs (Meloxicam) .... Take 1 tablet by mouth once a day 11)  Caltrate 600+d 600-400 Mg-unit Tabs (Calcium carbonate-vitamin d) .... Take 1 by mouth once daily 12)  Centrum Silver Tabs (Multiple vitamins-minerals) .... Take 1 by mouth once daily 13)  Vitamin B-12 1000 Mcg Tabs (Cyanocobalamin) .... Take 1 tab daily... 14)  Alprazolam 0.25 Mg Tabs (Alprazolam) .... Take one tablet three times a day as needed fpr anxiety. 15)  Meclizine Hcl 25 Mg Tabs (Meclizine hcl) .... Take 1/2 to 1 tab by mouth every 6 h as needed for dizziness...  Patient Instructions: 1)  Today we updated your med list- see below.... 2)  We refilled your Lasix & Mobic per request... 3)  For  your leg discomfort> try the Tonic water, Mustard, or "bar of soap" tricks that we discussed  & call if symptoms worsen... 4)  Today we did your follow up metabolic panel... please call the "phone tree" in a few days for your lab results.Marland KitchenMarland Kitchen 5)  Stay as active as poss... 6)  Please schedule a follow-up appointment in 6 months, sooner as needed. Prescriptions: MOBIC 7.5 MG TABS (MELOXICAM) Take 1 tablet by mouth once a day  #90 x 4   Entered and Authorized by:   Michele Mcalpine MD   Signed by:   Michele Mcalpine MD on 10/16/2010   Method used:   Print then Give to Patient   RxID:   [id number] LASIX 40 MG  TABS (FUROSEMIDE) Take 1 tablet by mouth once a day  #90 x 4   Entered and Authorized by:   Michele Mcalpine MD   Signed by:   Michele Mcalpine MD on 10/16/2010   Method used:   Print then Give to Patient   RxID:   [id number]    Immunization History:  Influenza Immunization History:    Influenza:  historical (08/07/2010)

## 2010-12-28 NOTE — Progress Notes (Signed)
Summary: sick  Phone Note Call from Patient Call back at Home Phone [phone]   Caller: Patient Call For: [name] Reason for Call: Talk to Nurse Summary of Call: pt c/o sore throat Sat. to a lot of head congestion.  Really tight, some yellow plegm.  Pt has rx for her sore throat.  Not working. Initial call taken by: Eugene Gavia,  December 15, 2009 8:41 AM  Follow-up for Phone Call        Pt c/o head congestion, blowing out yellow mucus and a sore throat x 5 days. Pt states she has Nystatin but it is not helping. [facility]. Please advise. Carron Curie CMA  December 15, 2009 9:38 AM   per SN---ok for pt to have augmentin 875g  #14  1 by mouth two times a day and use mucinex max  otc 1 by mouth two times a day with plenty of fluids Randell Loop CMA  December 15, 2009 10:20 AM   Additional Follow-up for Phone Call Additional follow up Details #1::        rx sent. pt aware of recs. Carron Curie CMA  December 15, 2009 10:25 AM     New/Updated Medications: AUGMENTIN 875-125 MG TABS (AMOXICILLIN-POT CLAVULANATE) Take 1 tablet by mouth two times a day Prescriptions: AUGMENTIN 875-125 MG TABS (AMOXICILLIN-POT CLAVULANATE) Take 1 tablet by mouth two times a day  #14 x 0   Entered by:   Carron Curie CMA   Authorized by:   Michele Mcalpine MD   Signed by:   Carron Curie CMA on 12/15/2009   Method used:   [facility] to        [phone][facility] (retail)       [address] [place], [place]  [address]       Ph: [phone] or [phone]       Fax: [phone]   RxID:   [id number]

## 2010-12-28 NOTE — Medication Information (Signed)
Summary: rov/sp  Anticoagulant Therapy  Managed by: Weston Brass, PharmD Referring MD: Charlton Haws, MD PCP: Alroy Dust, MD  Supervising MD: Graciela Husbands MD, Viviann Spare Indication 1: Atrial Fibrillation (ICD-427.31) Indication 2: delete Lab Used: [facility] Site: [facility] INR POC 2.4 INR RANGE 2 - 3  Dietary changes: no    Health status changes: no    Bleeding/hemorrhagic complications: no    Recent/future hospitalizations: no    Any changes in medication regimen? no    Recent/future dental: no  Any missed doses?: no       Is patient compliant with meds? yes       Allergies: 1)  ! Codeine  Anticoagulation Management History:      The patient is taking warfarin and comes in today for a routine follow up visit.  Positive risk factors for bleeding include an age of 75 years or older.  The bleeding index is 'intermediate risk'.  Positive CHADS2 values include History of HTN and Age > 30 years old.  The start date was 04/01/1998.  Her last INR was 2.2 RATIO.  Anticoagulation responsible provider: Graciela Husbands MD, Viviann Spare.  INR POC: 2.4.  Cuvette Lot#: [id number].  Exp: 08/2011.    Anticoagulation Management Assessment/Plan:      The patient's current anticoagulation dose is Warfarin sodium 5 mg tabs: Use as directed by Anticoagulation Clinic.  The target INR is 2 - 3.  The next INR is due 08/17/2010.  Anticoagulation instructions were given to patient.  Results were reviewed/authorized by Weston Brass, PharmD.  She was notified by Gweneth Fritter, PharmD Candidate.         Prior Anticoagulation Instructions: INR 2.3  Continue same dose of 1 tablet every day except 1 1/2 tablets on Monday, Wednesday, and Friday  Current Anticoagulation Instructions: INR 2.4  Continue taking 1 tablet (5mg ) every day except take 1.5 tablets (7.5mg ) on Mondays, Wednesdays, and Fridays.  Recheck in 4 weeks.

## 2010-12-28 NOTE — Assessment & Plan Note (Signed)
Summary: add-on abn myoview    Visit Type:  DOD ADD-ON Primary Provider:  Alroy Dust  CC:  pt added on for abnormal myoview today...is pt of Dr. Charlton Haws....denies any cardiac complaints today.  History of Present Illness: [name] is being seen today because of an abnormal stress nuclear study.  She was evaluated by Dr.Nishan Monday. At that time she was complaining of some shortness of breath and nonspecified chest pain.  She has multiple cardiac risk factors and also history of atrial for ablation on Coumadin. Please see his extensive note.  During her stress test today, she developed shortness of breath early in the stage I and had some nonsustained ventricular tachycardia. There were nonspecific ST segment changes. Sats stayed at 94-96%. Blood pressure was blunted. She exercised for 4 minutes and 15 seconds. Her Myoview images showed an EF of 77% with anterior and apical ischemia.  I reviewed this with her today. I've recommended cardiac catheterization off Coumadin. I discussed this with Dr. [name] as well. He would like to have this scheduled next Tuesday with Dr.Brodie in the outpatient lab.  Current Medications (verified): 1)  Warfarin Sodium 5 Mg Tabs (Warfarin Sodium) .... Use As Directed By Anticoagulation Clinic 2)  Coreg 3.125 Mg  Tabs (Carvedilol) .... Take 1 By Mouth Two Times A Day 3)  Accupril 20 Mg  Tabs (Quinapril Hcl) .Marland Kitchen.. 1 Tab Once Daily 4)  Lasix 40 Mg  Tabs (Furosemide) .... Take 1 Tablet By Mouth Once A Day 5)  Spironolactone 25 Mg Tabs (Spironolactone) .Marland Kitchen.. 1 Tab By Mouth Once Daily 6)  Klor-Con 20 Meq  Pack (Potassium Chloride) .... Take 2 Tabs By Mouth Two Times A Day... 7)  Lipitor 20 Mg  Tabs (Atorvastatin Calcium) .... Take 1 By Mouth At Bedtime 8)  Synthroid 125 Mcg  Tabs (Levothyroxine Sodium) .... Take 1 By Mouth Once Daily 9)  Climara 0.1 Mg/24hr  Ptwk (Estradiol) .Marland Kitchen.. 1 Every Week 10)  Mobic 7.5 Mg Tabs (Meloxicam) .... Take 1 Tablet By Mouth  Once A Day 11)  Caltrate 600+d 600-400 Mg-Unit  Tabs (Calcium Carbonate-Vitamin D) .... Take 1 By Mouth Once Daily 12)  Centrum Silver   Tabs (Multiple Vitamins-Minerals) .... Take 1 By Mouth Once Daily 13)  Vitamin B-12 1000 Mcg Tabs (Cyanocobalamin) .... Take 1 Tab Daily... 14)  Alprazolam 0.25 Mg  Tabs (Alprazolam) .... Take One Tablet Three Times A Day As Needed Fpr Anxiety. 15)  Meclizine Hcl 25 Mg  Tabs (Meclizine Hcl) .... Take 1/2 To 1 Tab By Mouth Every 6 H As Needed For Dizziness...  Allergies: 1)  ! Codeine  Past History:  Past Medical History: Last updated: 04/17/2010 HYPERTENSION (ICD-401.9) ATRIAL FIBRILLATION (ICD-427.31) MITRAL VALVE DISORDER (ICD-424.0) ? of PATENT FORAMEN OVALE (ICD-745.5) PERIPHERAL VASCULAR DISEASE (ICD-443.9) VENOUS INSUFFICIENCY (ICD-459.81) HYPERLIPIDEMIA (ICD-272.4) IMPAIRED GLUCOSE TOLERANCE (ICD-271.3) HYPOTHYROIDISM (ICD-244.9) Hx of COLON CANCER (ICD-153.9) Hx of SPLENIC INFARCTION (ICD-289.59) UTERINE CANCER, HX OF (ICD-V10.42) Hx of URINARY TRACT INFECTION (ICD-599.0) OSTEOARTHRITIS (ICD-715.90) LOW BACK PAIN, CHRONIC (ICD-724.2) OSTEOPOROSIS (ICD-733.00) PERIPHERAL NEUROPATHY (ICD-356.9) ANXIETY (ICD-300.00) VITAMIN B12 DEFICIENCY (ICD-266.2)  Past Surgical History: Last updated: 04/17/2010 S/P hysterectomy for uterine cancer S/P AP repair 5/07 by Latanya Presser S/P basal cell skin cancer Moh's surgery S/P right hemicolectomy for colon cancer - 1994 S/P lumbar laminectomy for spinal stenosis L3-4 in 2006 S/P left carpal tunnel surgery 4/08 by DrSypher  Family History: Last updated: 06/23/08 mother deceased at age 79  from cancer father deceased at age 22  from  heart failure 1 brother deceased age 17 cancer 1 sister deceased age 5 stroke and heart problems  Social History: Last updated: 06/22/2008 Retired married no children social etoh  Risk Factors: Smoking Status: never (10/29/2008)  Review of Systems        negative other than history of present illness  Vital Signs:  Patient profile:   77 year old female Height:      71 inches Weight:      192 pounds BMI:     26.88 Pulse rate:   69 / minute Pulse rhythm:   irregular BP sitting:   130 / 80  (left arm) Cuff size:   large  Vitals Entered By: Danielle Rankin, CMA (Apr 20, 2010 12:23 PM)  Physical Exam  General:  Well developed, well nourished, in no acute distress. Head:  normocephalic and atraumatic Eyes:  PERRLA/EOM intact; conjunctiva and lids normal. Neck:  Neck supple, no JVD. No masses, thyromegaly or abnormal cervical nodes. Chest Nakkia Mackiewicz:  no deformities or breast masses noted Lungs:  Clear bilaterally to auscultation and percussion. Heart:  Non-displaced PMI, chest non-tender; regular rate and rhythm, S1, S2 without murmurs, rubs or gallops. Carotid upstroke normal, no bruit. Normal abdominal aortic size, no bruits. Femorals normal pulses, no bruits. Pedals normal pulses. No edema, no varicosities. Abdomen:  Bowel sounds positive; abdomen soft and non-tender without masses, organomegaly, or hernias noted. No hepatosplenomegaly. Msk:  Back normal, normal gait. Muscle strength and tone normal. Pulses:  pulses normal in all 4 extremities Extremities:  No clubbing or cyanosis. Neurologic:  Alert and oriented x 3. Skin:  Intact without lesions or rashes. Psych:  Normal affect.   Problems:  Medical Problems Added: 1)  Dx of Abnormal Cv (STRESS) Test  (ICD-794.39)  Impression & Recommendations:  Problem # 1:  CHEST PAIN UNSPECIFIED (ICD-786.50) Assessment New  I suspect she has significant obstructive disease the LAD. I showed her this today a heart model. We have recommended cardiac catheterization next week off of Coumadin. Indications, risks and potential benefits have been discussed. She agrees to proceed. Her updated medication list for this problem includes:    Warfarin Sodium 5 Mg Tabs (Warfarin sodium) ..... Use as directed  by anticoagulation clinic    Coreg 3.125 Mg Tabs (Carvedilol) .Marland Kitchen... Take 1 by mouth two times a day    Accupril 20 Mg Tabs (Quinapril hcl) .Marland Kitchen... 1 tab once daily  Orders: Cardiac Catheterization (Cardiac Cath)  Problem # 2:  ABNORMAL CV (STRESS) TEST (ICD-794.39) Assessment: New  Orders: Cardiac Catheterization (Cardiac Cath)  Problem # 3:  ATRIAL FIBRILLATION (ICD-427.31) Assessment: Unchanged  Her updated medication list for this problem includes:    Warfarin Sodium 5 Mg Tabs (Warfarin sodium) ..... Use as directed by anticoagulation clinic    Coreg 3.125 Mg Tabs (Carvedilol) .Marland Kitchen... Take 1 by mouth two times a day  Problem # 4:  COUMADIN THERAPY (ICD-V58.61) Assessment: Unchanged  Problem # 5:  MITRAL VALVE DISORDER (ICD-424.0) Assessment: Unchanged  Her updated medication list for this problem includes:    Coreg 3.125 Mg Tabs (Carvedilol) .Marland Kitchen... Take 1 by mouth two times a day    Accupril 20 Mg Tabs (Quinapril hcl) .Marland Kitchen... 1 tab once daily    Lasix 40 Mg Tabs (Furosemide) .Marland Kitchen... Take 1 tablet by mouth once a day    Spironolactone 25 Mg Tabs (Spironolactone) .Marland Kitchen... 1 tab by mouth once daily  Problem # 6:  HYPERTENSION (ICD-401.9) Assessment: Unchanged  Her updated medication list for this problem  includes:    Coreg 3.125 Mg Tabs (Carvedilol) .Marland Kitchen... Take 1 by mouth two times a day    Accupril 20 Mg Tabs (Quinapril hcl) .Marland Kitchen... 1 tab once daily    Lasix 40 Mg Tabs (Furosemide) .Marland Kitchen... Take 1 tablet by mouth once a day    Spironolactone 25 Mg Tabs (Spironolactone) .Marland Kitchen... 1 tab by mouth once daily  Problem # 7:  PERIPHERAL VASCULAR DISEASE (ICD-443.9) Assessment: Unchanged  Problem # 8:  HYPERLIPIDEMIA (ICD-272.4) Assessment: Unchanged  Her updated medication list for this problem includes:    Lipitor 20 Mg Tabs (Atorvastatin calcium) .Marland Kitchen... Take 1 by mouth at bedtime  Patient Instructions: 1)  Come to our office on Tuesday AM at 9am for coumadin check 2)  Your physician has  requested that you have a cardiac catheterization.  Cardiac catheterization is used to diagnose and/or treat various heart conditions. Doctors may recommend this procedure for a number of different reasons. The most common reason is to evaluate chest pain. Chest pain can be a symptom of coronary artery disease (CAD), and cardiac catheterization can show whether plaque is narrowing or blocking your heart's arteries. This procedure is also used to evaluate the valves, as well as measure the blood flow and oxygen levels in different parts of your heart.  For further information please visit [email or web address]  Please follow instruction sheet, as given.

## 2010-12-28 NOTE — Progress Notes (Signed)
Summary: coug--rx to pharmacy  Phone Note Call from Patient Call back at Home Phone [phone]   Caller: Patient Call For: nadel Summary of Call: pt c/o cough- "sometimes yellow mucus w/ sinus congestion. "especially coughing at night". x 3 days.  no fever[facility] [address]  Initial call taken by: Tivis Ringer, CNA,  November 23, 2010 9:12 AM  Follow-up for Phone Call        ATCx2 line busy. WCB.Carron Curie CMA  November 23, 2010 9:54 AM   Spoke with pt.  She is c/o PND and yellow nasal d/c,  and cough at night- dry.  She states onset was 2 wks ago- getting worse.  She denies any other complaints. Would like something called to pharmacy.  Pls advise thanks allergic to codiene only Follow-up by: Vernie Murders,  November 23, 2010 10:05 AM  Additional Follow-up for Phone Call Additional follow up Details #1::        per SN----ok for pt to have zpak #1  take as directed, pred dosepak  #1  5 mg    6 day pack take as directed, mucinex otc 600mg    2 by mouth two times a day with plenty of fluids. thanks Randell Loop CMA  November 23, 2010 11:15 AM   Pt is aware of all SN recs w/ verbal understanding of directions. Additional Follow-up by: Michel Bickers CMA,  November 23, 2010 11:51 AM    New/Updated Medications: PREDNISONE (PAK) 5 MG TABS (PREDNISONE) 6 day pak; take as directed ZITHROMAX Z-PAK 250 MG TABS (AZITHROMYCIN) 2 by mouth today then 1 by mouth daily until gone Prescriptions: ZITHROMAX Z-PAK 250 MG TABS (AZITHROMYCIN) 2 by mouth today then 1 by mouth daily until gone  #1 x 0   Entered by:   Michel Bickers CMA   Authorized by:   Michele Mcalpine MD   Signed by:   Michel Bickers CMA on 11/23/2010   Method used:   Electronically to        [facility]  [phone]* (retail)       [address] [place], [place]  [address]       Ph: [phone] or [phone]       Fax: [phone]   RxID:   [id number] PREDNISONE (PAK) 5 MG TABS (PREDNISONE) 6 day pak; take  as directed  #1 x 0   Entered by:   Michel Bickers CMA   Authorized by:   Michele Mcalpine MD   Signed by:   Michel Bickers CMA on 11/23/2010   Method used:   Electronically to        [facility]  [phone]* (retail)       [address] [place], [place]  [address]       Ph: [phone] or [phone]       Fax: [phone]   RxID:   [id number]

## 2010-12-28 NOTE — Procedures (Signed)
Summary: Colonoscopy   Colonoscopy  Procedure date:  02/27/2005  Findings:      Location:  [facility].  Results: Polyp.    Comments:      Repeat colonoscopy in 5 years.   Procedures Next Due Date:    Colonoscopy: 02/2010  Colonoscopy  Procedure date:  02/27/2005  Findings:      Location:  [facility].  Results: Polyp.    Comments:      Repeat colonoscopy in 5 years.   Procedures Next Due Date:    Colonoscopy: 02/2010 Patient Name: [name], [name] MRN:  Procedure Procedures: Colonoscopy CPT: 16109.    with polypectomy. CPT: [id number].  Personnel: Endoscopist: Wilhemina Bonito. Marina Goodell, MD.  Exam Location: Exam performed in Outpatient Clinic. Outpatient  Patient Consent: Procedure, Alternatives, Risks and Benefits discussed, consent obtained, from patient. Consent was obtained by the RN.  Indications  Surveillance of: Colorectal Cancer. The patient has had prior cancer staging. Index tumor removed in 1994,  History  Current Medications: Patient is currently taking Coumadin.  Comments: COUMADIN HELD 4 DAYS PRIOR TO EXAM Pre-Exam Physical: Performed Feb 27, 2005. Entire physical exam was normal.  Exam Exam: Extent of exam reached: Anastamosis Site, extent intended: Anastamosis Site.  Patient position: on left side. Colon retroflexion performed. Images taken. ASA Classification: II. Tolerance: excellent.  Monitoring: Pulse and BP monitoring, Oximetry used. Supplemental O2 given.  Colon Prep Used MIRALAX for colon prep. Prep results: excellent.  Sedation Meds: Patient assessed and found to be appropriate for moderate (conscious) sedation. Fentanyl 75 mcg. given IV. Versed 9 mg. given IV.  Findings - PRIOR SURGERY: Ileum. Right Hemicolectomy. Anastamosis present.  NORMAL EXAM: Rectum to Ascending Colon.  POLYP: Sigmoid Colon, Maximum size: 1 mm. sessile polyp. Procedure:  snare without cautery, removed, not retrieved, ICD9: Colon  Polyps: 211.3. Comments: TISSUE DESTROYED UPON REMOVAL.   Assessment Abnormal examination, see findings above.  Diagnoses: 211.3: Colon Polyps.   Events  Unplanned Interventions: No intervention was required.  Unplanned Events: There were no complications. Plans  Post Exam Instructions: Restart medications: TODAY INCLUDING COUMADIN.  Disposition: After procedure patient sent to recovery. After recovery patient sent home.  Scheduling/Referral: Colonoscopy, to Wilhemina Bonito. Marina Goodell, MD, IN 5 YEARS,    This report was created from the original endoscopy report, which was reviewed and signed by the above listed endoscopist.   cc:  Alroy Dust, MD      The Patient

## 2010-12-28 NOTE — Medication Information (Signed)
Summary: rov/tm  Anticoagulant Therapy  Managed by: Bethena Midget, RN, BSN Referring MD: Charlton Haws MD Supervising MD: Jens Som MD, Arlys John Indication 1: Atrial Fibrillation (ICD-427.31) Indication 2: See every 2 months per Nishan.. (ICD-111) Lab Used: [facility] Site: [facility] INR POC 2.7 INR RANGE 2 - 3  Dietary changes: no    Health status changes: no    Bleeding/hemorrhagic complications: no    Recent/future hospitalizations: no    Any changes in medication regimen? no    Recent/future dental: no  Any missed doses?: no       Is patient compliant with meds? yes       Allergies: 1)  ! Codeine  Anticoagulation Management History:      The patient is taking warfarin and comes in today for a routine follow up visit.  Positive risk factors for bleeding include an age of 43 years or older.  The bleeding index is 'intermediate risk'.  Positive CHADS2 values include History of HTN and Age > 42 years old.  The start date was 04/01/1998.  Her last INR was 2.2 RATIO.  Anticoagulation responsible provider: Jens Som MD, Arlys John.  INR POC: 2.7.  Cuvette Lot#: [id number].  Exp: 02/2011.    Anticoagulation Management Assessment/Plan:      The patient's current anticoagulation dose is Warfarin sodium 5 mg tabs: Use as directed by Anticoagulation Clinic.  The target INR is 2 - 3.  The next INR is due 02/01/2010.  Anticoagulation instructions were given to patient.  Results were reviewed/authorized by Bethena Midget, RN, BSN.  She was notified by Bethena Midget, RN, BSN.         Prior Anticoagulation Instructions: INR 1.6 Today take 7.5mg s then change dose to 5mg s everyday except 7.5mg s on Mondays and Fridays. Recheck in 2 weeks.   Current Anticoagulation Instructions: INR 2.7 Continue 5mg s everyday except 7.5mg s on Mondays and Fridays.  Recheck in 4 weeks.

## 2010-12-28 NOTE — Medication Information (Signed)
Summary: rov/ewj  Anticoagulant Therapy  Managed by: Weston Brass, PharmD Referring MD: Charlton Haws, MD PCP: Alroy Dust, MD  Supervising MD: Jens Som MD, Arlys John Indication 1: Atrial Fibrillation (ICD-427.31) Indication 2: delete Lab Used: [facility] Site: [facility] INR POC 2.4 INR RANGE 2 - 3  Dietary changes: no    Health status changes: no    Bleeding/hemorrhagic complications: no    Recent/future hospitalizations: no    Any changes in medication regimen? no    Recent/future dental: no  Any missed doses?: no       Is patient compliant with meds? yes       Allergies: 1)  ! Codeine  Anticoagulation Management History:      The patient is taking warfarin and comes in today for a routine follow up visit.  Positive risk factors for bleeding include an age of 75 years or older.  The bleeding index is 'intermediate risk'.  Positive CHADS2 values include History of HTN and Age > 75 years old.  The start date was 04/01/1998.  Her last INR was 2.2 RATIO.  Anticoagulation responsible provider: Jens Som MD, Arlys John.  INR POC: 2.4.  Cuvette Lot#: [id number].  Exp: 09/2011.    Anticoagulation Management Assessment/Plan:      The patient's current anticoagulation dose is Warfarin sodium 5 mg tabs: Use as directed by Anticoagulation Clinic.  The target INR is 2 - 3.  The next INR is due 11/09/2010.  Anticoagulation instructions were given to patient.  Results were reviewed/authorized by Weston Brass, PharmD.  She was notified by Hoy Register, PharmD Candidate.         Prior Anticoagulation Instructions: INR 2.0  Continue on same dosage 1 tablet daily except 1.5 tablets on Mondays, Wednesdays, and Fridays.  Recheck in 4 weeks.    Current Anticoagulation Instructions: INR 2.4 Continue previous dose of 1 tablet everyday except 1.5 tablets on Monday, Wednesday, and Friday Recheck INR in 4 weeks

## 2010-12-28 NOTE — Medication Information (Signed)
Summary: completed abx on 11/28/09/ewj   Anticoagulant Therapy  Managed by: Bethena Midget, RN, BSN Referring MD: Charlton Haws, MD PCP: Alroy Dust, MD  Supervising MD: Myrtis Ser MD, Tinnie Gens Indication 1: Atrial Fibrillation (ICD-427.31) Indication 2: delete Lab Used: [facility] Site: [facility] INR POC 2.2 INR RANGE 2 - 3  Dietary changes: no    Health status changes: no    Bleeding/hemorrhagic complications: no    Recent/future hospitalizations: no    Any changes in medication regimen? yes       Details: Zpak and Prednisone pack finished on Monday 11/28/10  Recent/future dental: no  Any missed doses?: no       Is patient compliant with meds? yes      Comments: Pt. took 5 mg on Monday instead of usual dose.   Allergies: 1)  ! Codeine  Anticoagulation Management History:      The patient is taking warfarin and comes in today for a routine follow up visit.  Positive risk factors for bleeding include an age of 23 years or older.  The bleeding index is 'intermediate risk'.  Positive CHADS2 values include History of HTN and Age > 17 years old.  The start date was 04/01/1998.  Her last INR was 2.2 RATIO.  Anticoagulation responsible provider: Myrtis Ser MD, Tinnie Gens.  INR POC: 2.2.  Cuvette Lot#: [id number].  Exp: 06/2011.    Anticoagulation Management Assessment/Plan:      The patient's current anticoagulation dose is Warfarin sodium 5 mg tabs: Use as directed by Anticoagulation Clinic.  The target INR is 2 - 3.  The next INR is due 12/28/2010.  Anticoagulation instructions were given to patient.  Results were reviewed/authorized by Bethena Midget, RN, BSN.         Prior Anticoagulation Instructions: INR 2.0  Continue same dose of 1 tablet every day except 1 1/2 tablest on Monday, Wednesday and Friday.  Recheck INR in 4 weeks.   Current Anticoagulation Instructions: INR 2.2  Coumadin 5 mg tablets - Take 1 tablet on Sundays, Tuesdays, Thursdays, and Saturdays and 1.5 tablets on Mondays,  Wednesdays, and Fridays.

## 2010-12-28 NOTE — Medication Information (Signed)
Summary: rov/sp  Anticoagulant Therapy  Managed by: Elaina Pattee, PharmD Referring MD: Charlton Haws, MD PCP: Alroy Dust Supervising MD: Juanda Chance MD, Cathyrn Deas Indication 1: Atrial Fibrillation (ICD-427.31) Indication 2: delete Lab Used: [facility] Site: [facility] INR POC 1.8 INR RANGE 2 - 3  Dietary changes: no    Health status changes: no    Bleeding/hemorrhagic complications: no    Recent/future hospitalizations: yes       Details: Colonoscopy scheduled for 05/25/10.  Per Dr. Marina Goodell, will be off Coumadin 4 days prior to procedure.  Last dose on 05/20/10.  Any changes in medication regimen? no    Recent/future dental: no  Any missed doses?: no       Is patient compliant with meds? yes       Allergies: 1)  ! Codeine  Anticoagulation Management History:      The patient is taking warfarin and comes in today for a routine follow up visit.  Positive risk factors for bleeding include an age of 50 years or older.  The bleeding index is 'intermediate risk'.  Positive CHADS2 values include History of HTN and Age > 48 years old.  The start date was 04/01/1998.  Her last INR was 2.2 RATIO.  Anticoagulation responsible provider: Juanda Chance MD, Smitty Cords.  INR POC: 1.8.  Cuvette Lot#: [id number].  Exp: 06/2011.    Anticoagulation Management Assessment/Plan:      The patient's current anticoagulation dose is Warfarin sodium 5 mg tabs: Use as directed by Anticoagulation Clinic.  The target INR is 2 - 3.  The next INR is due 05/12/2010.  Anticoagulation instructions were given to patient.  Results were reviewed/authorized by Elaina Pattee, PharmD.  She was notified by Elaina Pattee, PharmD.         Prior Anticoagulation Instructions: INR 1.1  Take 1 1/2 tablets today then resume same dose of 1 tablet every day except 1 1/2 tablets on Monday, Wednesday and Friday  Current Anticoagulation Instructions: INR 1.8. Take 2 tablets today, then take 1 tablet daily except 1.5 on Mon, Wed, Fri.  Recheck on  05/12/10.

## 2010-12-28 NOTE — Letter (Signed)
Summary: Patient Notice- Polyp Results  [facility]  [address] [place], [place] [address]   Phone: [phone]  Fax: [phone]        June 02, 2010 MRN: [id number]    [name] [address] [place], [place]  [address]    Dear [name],  I am pleased to inform you that the colon polyp(s) removed during your recent colonoscopy was (were) found to be benign (no cancer detected) upon pathologic examination.  I recommend you have a repeat colonoscopy examination in 5 years to look for recurrent polyps, as having colon polyps increases your risk for having recurrent polyps or even colon cancer in the future.  Should you develop new or worsening symptoms of abdominal pain, bowel habit changes or bleeding from the rectum or bowels, please schedule an evaluation with either your primary care physician or with me.  Additional information/recommendations:  __ No further action with gastroenterology is needed at this time. Please      follow-up with your primary care physician for your other healthcare      needs.   Please call us if you are having persistent problems or have questions about your condition that have not been fully answered at this time.  Sincerely,  Hilarie Fredrickson MD  This letter has been electronically signed by your physician.  Appended Document: Patient Notice- Polyp Results letter mailed

## 2010-12-28 NOTE — Medication Information (Signed)
Summary: rov/tm  Anticoagulant Therapy  Managed by: Shelby Dubin, PharmD, BCPS, CPP Referring MD: Charlton Haws MD Supervising MD: Shirlee Latch MD, Freida Busman Indication 1: Atrial Fibrillation (ICD-427.31) Indication 2: delete Lab Used: [facility] Site: [facility] INR POC 1.9 INR RANGE 2 - 3  Dietary changes: no    Health status changes: no    Bleeding/hemorrhagic complications: no    Recent/future hospitalizations: no    Any changes in medication regimen? no    Recent/future dental: no  Any missed doses?: no       Is patient compliant with meds? yes       Current Medications (verified): 1)  Coreg 3.125 Mg  Tabs (Carvedilol) .... Take 1 By Mouth Two Times A Day 2)  Accupril 20 Mg  Tabs (Quinapril Hcl) .... Take One Tab By Mouth Twice Daily 3)  Lasix 40 Mg  Tabs (Furosemide) .... Take 1 Tablet By Mouth Once A Day 4)  Spironolactone 25 Mg Tabs (Spironolactone) .Marland Kitchen.. 1 Tab By Mouth Once Daily 5)  Klor-Con 20 Meq  Pack (Potassium Chloride) .... Take 2 Tabs By Mouth Two Times A Day... 6)  Lipitor 20 Mg  Tabs (Atorvastatin Calcium) .... Take 1 By Mouth At Bedtime 7)  Synthroid 125 Mcg  Tabs (Levothyroxine Sodium) .... Take 1 By Mouth Once Daily 8)  Climara 0.1 Mg/24hr  Ptwk (Estradiol) .Marland Kitchen.. 1 Every Week 9)  Caltrate 600+d 600-400 Mg-Unit  Tabs (Calcium Carbonate-Vitamin D) .... Take 1 By Mouth Once Daily 10)  Centrum Silver   Tabs (Multiple Vitamins-Minerals) .... Take 1 By Mouth Once Daily 11)  Vitamin B-12 1000 Mcg Tabs (Cyanocobalamin) .... Take 1 Tab Daily... 12)  Mobic 7.5 Mg  Tabs (Meloxicam) .Marland Kitchen.. 1 By Mouth Once Daily As Needed Joint Pain 13)  Tramadol Hcl 50 Mg  Tabs (Tramadol Hcl) .Marland Kitchen.. 1 By Mouth Every 4 Hrs As Needed. 14)  Alprazolam 0.25 Mg  Tabs (Alprazolam) .... Take One Tablet Three Times A Day As Needed Fpr Anxiety. 15)  Meclizine Hcl 25 Mg  Tabs (Meclizine Hcl) .... Take 1/2 To 1 Tab By Mouth Every 6 H As Needed For Dizziness... 16)  Warfarin Sodium 5 Mg Tabs (Warfarin  Sodium) .... Use As Directed By Anticoagulation Clinic  Allergies (verified): 1)  ! Codeine  Anticoagulation Management History:      The patient is taking warfarin and comes in today for a routine follow up visit.  Positive risk factors for bleeding include an age of 45 years or older.  The bleeding index is 'intermediate risk'.  Positive CHADS2 values include History of HTN and Age > 25 years old.  The start date was 04/01/1998.  Her last INR was 2.2 RATIO.  Anticoagulation responsible provider: Shirlee Latch MD, Trace Cederberg.  INR POC: 1.9.  Cuvette Lot#: [id number].  Exp: 03/2011.    Anticoagulation Management Assessment/Plan:      The patient's current anticoagulation dose is Warfarin sodium 5 mg tabs: Use as directed by Anticoagulation Clinic.  The target INR is 2 - 3.  The next INR is due 02/20/2010.  Anticoagulation instructions were given to patient.  Results were reviewed/authorized by Shelby Dubin, PharmD, BCPS, CPP.  She was notified by Shelby Dubin PharmD, BCPS, CPP.         Prior Anticoagulation Instructions: INR 2.7 Continue 5mg s everyday except 7.5mg s on Mondays and Fridays.  Recheck in 4 weeks.   Current Anticoagulation Instructions: INR 1.9  Take extra 0.5 tab tomorrow, then resume 1.5 tabs each Monday and Friday and  1 tab on all other days.  Recheck in 4 weeks.

## 2010-12-28 NOTE — Letter (Signed)
Summary: [facility] Instructions  [facility]  [address] [place], [place] [address]   Phone: [phone]  Fax: [phone]       [name]    [birth year]    MRN: [id number]        Procedure Day /Date:THURSDAY 05/25/10     Arrival Time:3:00 PM     Procedure Time:4:00 PM     Location of Procedure:                    [facility] (4th Floor)                        PREPARATION FOR COLONOSCOPY WITH MOVIPREP   Starting 5 days prior to your procedure 05/20/10 do not eat nuts, seeds, popcorn, corn, beans, peas,  salads, or any raw vegetables.  Do not take any fiber supplements (e.g. Metamucil, Citrucel, and Benefiber).  THE DAY BEFORE YOUR PROCEDURE         DATE: 05/24/10  DAY: WEDNESDAY  1.  Drink clear liquids the entire day-NO SOLID FOOD  2.  Do not drink anything colored red or purple.  Avoid juices with pulp.  No orange juice.  3.  Drink at least 64 oz. (8 glasses) of fluid/clear liquids during the day to prevent dehydration and help the prep work efficiently.  CLEAR LIQUIDS INCLUDE: Water Jello Ice Popsicles Tea (sugar ok, no milk/cream) Powdered fruit flavored drinks Coffee (sugar ok, no milk/cream) Gatorade Juice: apple, white grape, white cranberry  Lemonade Clear bullion, consomm, broth Carbonated beverages (any kind) Strained chicken noodle soup Hard Candy                             4.  In the morning, mix first dose of MoviPrep solution:    Empty 1 Pouch A and 1 Pouch B into the disposable container    Add lukewarm drinking water to the top line of the container. Mix to dissolve    Refrigerate (mixed solution should be used within 24 hrs)  5.  Begin drinking the prep at 5:00 p.m. The MoviPrep container is divided by 4 marks.   Every 15 minutes drink the solution down to the next mark (approximately 8 oz) until the full liter is complete.   6.  Follow completed prep with 16 oz of clear liquid of your choice (Nothing red  or purple).  Continue to drink clear liquids until bedtime.  7.  Before going to bed, mix second dose of MoviPrep solution:    Empty 1 Pouch A and 1 Pouch B into the disposable container    Add lukewarm drinking water to the top line of the container. Mix to dissolve    Refrigerate  THE DAY OF YOUR PROCEDURE      DATE: 05/25/10 DAY: THURSDAY  Beginning at 11:00 a.m. (5 hours before procedure):         1. Every 15 minutes, drink the solution down to the next mark (approx 8 oz) until the full liter is complete.  2. Follow completed prep with 16 oz. of clear liquid of your choice.    3. You may drink clear liquids until 2:00 PM (2 HOURS BEFORE PROCEDURE).   MEDICATION INSTRUCTIONS  Unless otherwise instructed, you should take regular prescription medications with a small sip of water   as early as possible the morning of your procedure.   Stop  taking Coumadin on  05/21/10 (4 days before procedure).           OTHER INSTRUCTIONS  You will need a responsible adult at least 75 years of age to accompany you and drive you home.   This person must remain in the waiting room during your procedure.  Wear loose fitting clothing that is easily removed.  Leave jewelry and other valuables at home.  However, you may wish to bring a book to read or  an iPod/MP3 player to listen to music as you wait for your procedure to start.  Remove all body piercing jewelry and leave at home.  Total time from sign-in until discharge is approximately 2-3 hours.  You should go home directly after your procedure and rest.  You can resume normal activities the  day after your procedure.  The day of your procedure you should not:   Drive   Make legal decisions   Operate machinery   Drink alcohol   Return to work  You will receive specific instructions about eating, activities and medications before you leave.    The above instructions have been reviewed and explained to me by    _______________________    I fully understand and can verbalize these instructions _____________________________ Date _________

## 2010-12-28 NOTE — Medication Information (Signed)
Summary: rov/cb  Anticoagulant Therapy  Managed by: Weston Brass, PharmD Referring MD: Charlton Haws, MD PCP: Alroy Dust, MD  Supervising MD: Juanda Chance MD, Wake Conlee Indication 1: Atrial Fibrillation (ICD-427.31) Indication 2: delete Lab Used: [facility] Site: [facility] INR POC 2.3 INR RANGE 2 - 3  Dietary changes: no    Health status changes: yes       Details: having colonoscopy on 6/30.  Will hold Coumadin starting 6/23.   Bleeding/hemorrhagic complications: no    Recent/future hospitalizations: no    Any changes in medication regimen? no    Recent/future dental: no  Any missed doses?: no       Is patient compliant with meds? yes       Allergies: 1)  ! Codeine  Anticoagulation Management History:      The patient is taking warfarin and comes in today for a routine follow up visit.  Positive risk factors for bleeding include an age of 75 years or older.  The bleeding index is 'intermediate risk'.  Positive CHADS2 values include History of HTN and Age > 18 years old.  The start date was 04/01/1998.  Her last INR was 2.2 RATIO.  Anticoagulation responsible provider: Juanda Chance MD, Smitty Cords.  INR POC: 2.3.  Cuvette Lot#: [id number].  Exp: 06/2011.    Anticoagulation Management Assessment/Plan:      The patient's current anticoagulation dose is Warfarin sodium 5 mg tabs: Use as directed by Anticoagulation Clinic.  The target INR is 2 - 3.  The next INR is due 06/02/2010.  Anticoagulation instructions were given to patient.  Results were reviewed/authorized by Weston Brass, PharmD.  She was notified by Weston Brass PharmD.         Prior Anticoagulation Instructions: INR 1.8. Take 2 tablets today, then take 1 tablet daily except 1.5 on Mon, Wed, Fri.  Recheck on 05/12/10.     Current Anticoagulation Instructions: INR 2.3  Continue same dose of 1 tablet every day except 1 1/2 tablets on Monday, Wednesday and Friday.  Take last dose of Coumadin on 6/25.  Colonoscopy on 6/30.  Restart Coumadin  when okay with MD.  Restart with 2 tablets daily for 2 days then resume normal dose.

## 2010-12-28 NOTE — Medication Information (Signed)
Summary: rov/sp  Anticoagulant Therapy  Managed by: Weston Brass, PharmD Referring MD: Charlton Haws, MD PCP: Alroy Dust, MD  Supervising MD: Gala Romney MD, Reuel Boom Indication 1: Atrial Fibrillation (ICD-427.31) Indication 2: delete Lab Used: [facility] Site: [facility] INR POC 2.3 INR RANGE 2 - 3  Dietary changes: no    Health status changes: no    Bleeding/hemorrhagic complications: no    Recent/future hospitalizations: no    Any changes in medication regimen? no    Recent/future dental: no  Any missed doses?: no       Is patient compliant with meds? yes       Allergies: 1)  ! Codeine  Anticoagulation Management History:      The patient is taking warfarin and comes in today for a routine follow up visit.  Positive risk factors for bleeding include an age of 75 years or older.  The bleeding index is 'intermediate risk'.  Positive CHADS2 values include History of HTN and Age > 40 years old.  The start date was 04/01/1998.  Her last INR was 2.2 RATIO.  Anticoagulation responsible provider: Bensimhon MD, Reuel Boom.  INR POC: 2.3.  Cuvette Lot#: [id number].  Exp: 08/2011.    Anticoagulation Management Assessment/Plan:      The patient's current anticoagulation dose is Warfarin sodium 5 mg tabs: Use as directed by Anticoagulation Clinic.  The target INR is 2 - 3.  The next INR is due 07/20/2010.  Anticoagulation instructions were given to patient.  Results were reviewed/authorized by Weston Brass, PharmD.  She was notified by Weston Brass PharmD.         Prior Anticoagulation Instructions: INR 1.8  Take 1 1/2 tablets today then resume same dose of 1 tablet every day except 1 1/2 tablets on Monday, Wednesday and Friday.   Current Anticoagulation Instructions: INR 2.3  Continue same dose of 1 tablet every day except 1 1/2 tablets on Monday, Wednesday, and Friday

## 2010-12-28 NOTE — Cardiovascular Report (Signed)
Summary: Pre Cath Orders   Pre Cath Orders   Imported By: Roderic Ovens 05/15/2010 14:16:39  _____________________________________________________________________  External Attachment:    Type:   Image     Comment:   External Document

## 2010-12-28 NOTE — Procedures (Signed)
Summary: Colonoscopy/[place]  Colonoscopy/[place]   Imported By: Sherian Rein 05/04/2010 07:47:11  _____________________________________________________________________  External Attachment:    Type:   Image     Comment:   External Document

## 2010-12-28 NOTE — Medication Information (Signed)
Summary: rov/nb   Anticoagulant Therapy  Managed by: Weston Brass, PharmD Referring MD: Charlton Haws, MD PCP: Alroy Dust, MD  Supervising MD: Shirlee Latch MD, Freida Busman Indication 1: Atrial Fibrillation (ICD-427.31) Indication 2: delete Lab Used: [facility] Site: [facility] INR POC 2.0 INR RANGE 2 - 3  Dietary changes: no    Health status changes: no    Bleeding/hemorrhagic complications: no    Recent/future hospitalizations: no    Any changes in medication regimen? no    Recent/future dental: no  Any missed doses?: no       Is patient compliant with meds? yes       Allergies: 1)  ! Codeine  Anticoagulation Management History:      The patient is taking warfarin and comes in today for a routine follow up visit.  Positive risk factors for bleeding include an age of 53 years or older.  The bleeding index is 'intermediate risk'.  Positive CHADS2 values include History of HTN and Age > 71 years old.  The start date was 04/01/1998.  Her last INR was 2.2 RATIO.  Anticoagulation responsible provider: Shirlee Latch MD, [place].  INR POC: 2.0.  Cuvette Lot#: [id number].  Exp: 12/2011.    Anticoagulation Management Assessment/Plan:      The patient's current anticoagulation dose is Warfarin sodium 5 mg tabs: Use as directed by Anticoagulation Clinic.  The target INR is 2 - 3.  The next INR is due 12/06/2010.  Anticoagulation instructions were given to patient.  Results were reviewed/authorized by Weston Brass, PharmD.  She was notified by Weston Brass PharmD.         Prior Anticoagulation Instructions: INR 2.4 Continue previous dose of 1 tablet everyday except 1.5 tablets on Monday, Wednesday, and Friday Recheck INR in 4 weeks  Current Anticoagulation Instructions: INR 2.0  Continue same dose of 1 tablet every day except 1 1/2 tablest on Monday, Wednesday and Friday.  Recheck INR in 4 weeks.

## 2010-12-28 NOTE — Assessment & Plan Note (Signed)
Summary: 6 months/apc   Primary Care Provider:  Alroy Dust  CC:  6 month follow up--fasting today.  History of Present Illness: 77 y/o [name] here for a follow up visit... she has multiple medical problems as listed below...    ~  Jul09:  c/o burning in her legs... sounds neuropathic- she's had LumbarLam in 2006 by DrCram, but these symptoms are bilat & symmetric... no hx DM, etc... referred to Neurology & seen by DrYan- NCV's showed polyneuropathy: her eval revealed an abn GTT (1hr BS= 216, HgA1c= 6.4) and B12 level= 254... placed on low carb diet & started on B12 shots, along w/ 5% Lidocaine cream to apply to feet at night... she told DrYan 50% better in f/u, but she tells me- no change.  ~  Aug09:  had f/u Rheum at [facility]- osteoarthritis Rx'd w/ Mobic (they felt this was safe w/ her coumadin) & improved... poss CPPD & offered Rx w/ colchicine but felt it wasn't necessary...  ~  Dec09:  she's had f/u w/ DrYan for Neuro and DrBeekman for Rheum- notes reviewed... today c/o neuropathy symptoms no better, and bilat ankle pain/ swelling... we discussed topical therapy, warm soaks and continue Mobic Rx.   ~  Apr 18, 2009:  under stress- husb w/ prob ?pancreatic ca (wt loss, GI eval from DrJacobs)... she had some edema and saw DrNishan for f/u 3/10 w/ Aldactone added & edema much improved... she has perm AFib on Coumadin w/ good rate control... f/u 2DEcho 2/10 showed norm LVF w/o regional wall motion abn & EF= 55%, mild MS & mod MR (Rheumatic deformity), mod dil LA & RA, etc...    ~  October 17, 2009:  she has had a good 6 mo... today c/o sl sore throat, scratchy, no f/c/s, no drainage/ phlegm, etc... she is holding up well w/ considerable stress w/ husb illness (DrJacobs & [facility]- stent in bile duct & 9 biopsies- all neg, but they feel he has Ca)... she had her 2010 flu vaccine at [facility] last month... we reviewed her labs from 5/10...   ~  Apr 17, 2010:  she is under increasing stress w/ husb's illness- prob  pancreatic Ca, followed at [facility], he's lost >140#, etc... saw Walker Kehr for her HBP, edema, AFib on coumadin> stable, she takes extra Accupril if BP up, sched for Myoview soon...  she is due for f/u CXR (clear, NAD); fasting labs (OK- see below); & needs colonoscopy (sched w/ DrPerry for 05/02/10)...   Current Problem List:  HYPERTENSION (ICD-401.9) - controlled on COREG 3.125mg Bid, ACCUPRIL 20mg /d, LASIX 40mg /d, ALDACTONE 25mg /d & K20- 2tabsBid... BP= 118/72 today and similar at home unless stressed... denies HA, fatigue, visual changes, CP, palipit, dizziness, syncope, dyspnea, edema, etc...  ~  labs 9/09 at Neuro showed K= 4.3, BUN= 15, Creat= 0.6  ~  labs 1/10 showed K= 4.8, BUN= 21, Creat= 0.6  ~  labs 5/10 showed K= 4.5, BUN= 17, Creat= 0.6  ~  labs 5/11 showed K= 4.8, BUN= 19, Creat= 0.7; CXR- cardiomeg, mild hyperinflation, clear, NAD.  ATRIAL FIBRILLATION (ICD-427.31) - on Coumadin per cardiology & followed in the Coumadin Clinic... MITRAL VALVE DISORDER (ICD-424.0) - followed by Walker Kehr... ? of PATENT FORAMEN OVALE (ICD-745.5)  ~  NuclearStressTest 11/05 showed norm perfusion, mild apic thinning, no ischemia, EF=79%...  ~  2DEcho 9/08 showed norm LVF, EF=55-60%, no regional wall motion abn, mod thickening of MV, mod MR, mod dil LA, ?PFO vs small ASD.Chloe [name]. (prev echo's w/ mild MS)...  ~  2DEcho 2/10 showed norm LVF w/o regional wall motion abn & EF= 55%, mild MS & mod MR (Rheumatic deformity), mod dil LA & RA, etc...   PERIPHERAL VASCULAR DISEASE (ICD-443.9) - tortuous Ao on CXR...   ~  CTAngio Abd&Legs 7/08 showed mild to mod right RAS, no signif PVD, mild run-off disease in legs...  VENOUS INSUFFICIENCY (ICD-459.81) - on LASIX 40mg /d, ALDACTONE 25mg /d, + KCl - 2Bid... she follows a low sodium diet, elevates, support hose...  HYPERLIPIDEMIA (ICD-272.4) - on LIPITOR 20mg /d + diet Rx...   ~  FLP 5/08 showed TChol 100, TG 65, HDL 40, LDL 47  ~  FLP 3/09 showed TChol 114, TG 85, HDL 35,  LDL 62  ~  FLP 9/09 at Neuro showed TChol 125, TG 118, HDL 34, LDL 67  ~  FLP 5/10 showed TChol 136, TG 127, HDL 36, LDL 74  ~  FLP 5/11 showed TChol 177, TG 167, HDL 51, LDL 93  IMPAIRED GLUCOSE TOLERANCE (ICD-271.3) - on diet  alone> see eval by Sula Soda, Neurology...  ~  GTT 9/09 showed FBS= 101, 1hr BS= 216, A1c= 6.4  ~  labs 5/10 showed BS= 97, A1c= 6.7  ~  labs 5/11 showed BS= 107, A1c= 6.5  HYPOTHYROIDISM (ICD-244.9) - on SYNTHROID 184mcg/d...  ~  TSH 6/08 was normal at 2.95  ~  TSH 3/09 was 2.18  ~  labs 9/09 at Neuro showed TSH= 0.62  ~  labs 5/10 showed TSH= 0.71  ~  labs 5/11 showed TSH= 0.31  Hx of COLON CANCER (ICD-153.9) - s/p right hemicolectomy in 1994... no known recurrence.  ~  she is sched for colonoscopy 05/02/10 w/ DrPerry...  Hx of SPLENIC INFARCTION (ICD-289.59) - eval for left side pain 6/08 w/ CTAbd showing splenic infarct (she was on a coumadin alternative study drug at that time)... switched to coumadin at that time...  UTERINE CANCER, HX OF (ICD-V10.42) - s/p hysterectomy, subseq AP repair 5/07 DrNeal...  Hx of URINARY TRACT INFECTION (ICD-599.0)  OSTEOARTHRITIS (ICD-715.90) - known hand, shoulder, knee and CSpine arthritis as noted... takes MOBIC & TRAMODOL Prn... she requested appt at [facility] DrO'Rourke- seen 4/09 and he concurred w/ Mobic/ Tramadol for her Osteoarthritis...  ~  prev eval DrDaldorf for Ortho 12/08 & 12/09 w/ left hand osteoarth (injected her middle finger PIP), right elbow DJD (improved after injection), left knee torn medial meniscus on MRI (improved after injection), and hx lumbar spinal stenosis treated by injection in the past...  ~  XRay left hand 02/16/08 showed DJD, no fractures...  ~  Labs 3/09 showed  Hg=12.5, WBC=10.1, BMet=norm w/ K=4.3, sed=41, RA=neg, ANA=neg...  ~ Additional labs 9/09 at [facility]- reviewed.  LOW BACK PAIN, CHRONIC (ICD-724.2) - s/p lumbar laminectomy L3-4 for sp stenosis 2006...   PERIPHERAL NEUROPATHY (ICD-356.9) - eval  & Rx by Sula Soda... she uses MECLIZINE 25mg  Prn dizziness...  OSTEOPOROSIS (ICD-733.00) - she has osteoporosis w/ partial compression...  ANXIETY (ICD-300.00) - on ALPRAZOLAM 0.25mg  Prn...  VITAMIN B12 DEFICIENCY (ICD-266.2) - eval by DrYan 9/09 showed B12 level= 254 (211-911)... started on B12 shots which she took for about 26mo & switched to OTC Vit B12 1000 daily...  ~  labs 5/11 showed Vit B12 level = 506 (211-911)   Allergies: 1)  ! Codeine  Comments:  Nurse/Medical Assistant: The patient's medications and allergies were reviewed with the patient and were updated in the Medication and Allergy Lists.  Past History:  Past Medical History: HYPERTENSION (ICD-401.9) ATRIAL FIBRILLATION (ICD-427.31) MITRAL  VALVE DISORDER (ICD-424.0) ? of PATENT FORAMEN OVALE (ICD-745.5) PERIPHERAL VASCULAR DISEASE (ICD-443.9) VENOUS INSUFFICIENCY (ICD-459.81) HYPERLIPIDEMIA (ICD-272.4) IMPAIRED GLUCOSE TOLERANCE (ICD-271.3) HYPOTHYROIDISM (ICD-244.9) Hx of COLON CANCER (ICD-153.9) Hx of SPLENIC INFARCTION (ICD-289.59) UTERINE CANCER, HX OF (ICD-V10.42) Hx of URINARY TRACT INFECTION (ICD-599.0) OSTEOARTHRITIS (ICD-715.90) LOW BACK PAIN, CHRONIC (ICD-724.2) OSTEOPOROSIS (ICD-733.00) PERIPHERAL NEUROPATHY (ICD-356.9) ANXIETY (ICD-300.00) VITAMIN B12 DEFICIENCY (ICD-266.2)  Past Surgical History: S/P hysterectomy for uterine cancer S/P AP repair 5/07 by Latanya Presser S/P basal cell skin cancer Moh's surgery S/P right hemicolectomy for colon cancer - 1994 S/P lumbar laminectomy for spinal stenosis L3-4 in 2006 S/P left carpal tunnel surgery 4/08 by DrSypher  Family History: Reviewed history from 06/22/2008 and no changes required. mother deceased at age 81  from cancer father deceased at age 68  from heart failure 1 brother deceased age 48 cancer 1 sister deceased age 36 stroke and heart problems  Social History: Reviewed history from 06/22/2008 and no changes required. Retired married no  children social etoh  Review of Systems      See HPI       The patient complains of dyspnea on exertion.  The patient denies anorexia, fever, weight loss, weight gain, vision loss, decreased hearing, hoarseness, chest pain, syncope, peripheral edema, prolonged cough, headaches, hemoptysis, abdominal pain, melena, hematochezia, severe indigestion/heartburn, hematuria, incontinence, muscle weakness, suspicious skin lesions, transient blindness, difficulty walking, depression, unusual weight change, abnormal bleeding, enlarged lymph nodes, and angioedema.    Vital Signs:  Patient profile:   77 year old female Height:      71 inches Weight:      194 pounds O2 Sat:      97 % on Room air Temp:     97.0 degrees F oral Pulse rate:   57 / minute BP sitting:   118 / 72  (right arm) Cuff size:   regular  Vitals Entered By: Randell Loop CMA (Apr 17, 2010 10:14 AM)  O2 Sat at Rest %:  97 O2 Flow:  Room air CC: 6 month follow up--fasting today Is Patient Diabetic? No Pain Assessment Patient in pain? no      Comments meds updated today   Physical Exam  Additional Exam:  WD, WN, 77 y/o [name] in NAD... GENERAL:  Alert & oriented; pleasant & cooperative... HEENT:  [place]/AT, EOM-wnl, PERRLA, EACs-clear, TMs-wnl, NOSE-clear, THROAT-clear & wnl. NECK:  Supple w/ fair ROM; no JVD; normal carotid impulses w/o bruits; no thyromegaly or nodules palpated; no lymphadenopathy. CHEST:  Clear to P & A; without wheezes/ rales/ or rhonchi. HEART:  Regular rhythm, gr 1/6 SEM; without rubs/ or gallops heard... ABDOMEN:  Soft & nontender; normal bowel sounds; no organomegaly or masses detected. EXT: without deformities, mod arthritic changes; no varicose veins/ +venous insuffic/ tr edema. NEURO:  CN's intact;  no focal neuro deficits x distal neuropathy symptoms... DERM:  No lesions noted; no rash etc...    CXR  Procedure date:  04/17/2010  Findings:      CHEST - 2 VIEW Comparison: 04/18/2009     Findings: There is cardiomegaly.  Mild hyperinflation of the lungs. No confluent opacities, effusions or edema.  There is mild peribronchial thickening, stable.  No acute bony abnormality.   IMPRESSION: Stable cardiomegaly.   Stable hyperinflation and peribronchial thickening.   Read By:  Charlett Nose,  M.D.    MISC. Report  Procedure date:  04/17/2010  Findings:      BMP (METABOL)   Sodium  140 mEq/L                   135-145   Potassium                 4.8 mEq/L                   3.5-5.1   Chloride                  106 mEq/L                   96-112   Carbon Dioxide            29 mEq/L                    19-32   Glucose              [H]  107 mg/dL                   59-56   BUN                       19 mg/dL                    3-87   Creatinine                0.7 mg/dL                   5.6-4.3   Calcium                   9.0 mg/dL                   3.2-95.1   GFR                       87.21 mL/min                >60  Hemoglobin A1C (A1C)   Hemoglobin A1C            6.5 %                       4.6-6.5  Hepatic/Liver Function Panel (HEPATIC)   Total Bilirubin           1.1 mg/dL                   8.8-4.1   Direct Bilirubin          0.1 mg/dL                   6.6-0.6   Alkaline Phosphatase      65 U/L                      39-117   AST                       22 U/L                      0-37   ALT                       21 U/L                      0-35  Total Protein             7.2 g/dL                    0.4-5.4   Albumin                   4.3 g/dL                    0.9-8.1  CBC Platelet w/Diff (CBCD)   White Cell Count          7.8 K/uL                    4.5-10.5   Red Cell Count            4.29 Mil/uL                 3.87-5.11   Hemoglobin                14.0 g/dL                   19.1-47.8   Hematocrit                40.2 %                      36.0-46.0   MCV                       93.8 fl                     78.0-100.0   Platelet Count             258.0 K/uL                  150.0-400.0   Neutrophil %              60.7 %                      43.0-77.0   Lymphocyte %              26.5 %                      12.0-46.0   Monocyte %                8.3 %                       3.0-12.0   Eosinophils%              3.9 %                       0.0-5.0   Basophils %               0.6 %                       0.0-3.0  Comments:      TSH (TSH)   FastTSH              [L]  0.31 uIU/mL                 0.35-5.50  Lipid Panel (LIPID)   Cholesterol               177 mg/dL  0-200   Triglycerides        [H]  167.0 mg/dL                 1.3-086.5   HDL                       78.46 mg/dL                 >96.29   LDL Cholesterol           93 mg/dL                    5-28            Tests: (6) B12 Serum - Total ONLY (B12)   Vitamin B12               506 pg/mL                   211-911   Impression & Recommendations:  Problem # 1:  HYPERTENSION (ICD-401.9) Controlled> continue same Rx. Her updated medication list for this problem includes:    Coreg 3.125 Mg Tabs (Carvedilol) .Chloe [name]... Take 1 by mouth two times a day    Accupril 20 Mg Tabs (Quinapril hcl) .Chloe [name]... 1 tab once daily    Lasix 40 Mg Tabs (Furosemide) .Chloe [name]... Take 1 tablet by mouth once a day    Spironolactone 25 Mg Tabs (Spironolactone) .Chloe [name]... 1 tab by mouth once daily  Orders: T-2 View CXR (71020TC) TLB-BMP (Basic Metabolic Panel-BMET) (80048-METABOL) TLB-Hepatic/Liver Function Pnl (80076-HEPATIC) TLB-CBC Platelet - w/Differential (85025-CBCD) TLB-TSH (Thyroid Stimulating Hormone) (84443-TSH) TLB-Lipid Panel (80061-LIPID) TLB-B12, Serum-Total ONLY (82607-B12) TLB-A1C / Hgb A1C (Glycohemoglobin) (83036-A1C)  Problem # 2:  ATRIAL FIBRILLATION (ICD-427.31) Followed by drNishan, on Coumadin thru the CC... Her updated medication list for this problem includes:    Warfarin Sodium 5 Mg Tabs (Warfarin sodium) ..... Use as directed by anticoagulation clinic    Coreg 3.125 Mg  Tabs (Carvedilol) .Chloe [name]... Take 1 by mouth two times a day  Problem # 3:  VENOUS INSUFFICIENCY (ICD-459.81) Stable on diuretics...  Problem # 4:  HYPERLIPIDEMIA (ICD-272.4) Chol looks OK but needs better low fat diet... Her updated medication list for this problem includes:    Lipitor 20 Mg Tabs (Atorvastatin calcium) .Chloe [name]... Take 1 by mouth at bedtime  Problem # 5:  IMPAIRED GLUCOSE TOLERANCE (ICD-271.3) Stable on diet alone...  Problem # 6:  HYPOTHYROIDISM (ICD-244.9) TSH sl low>  continue same dose for now... Her updated medication list for this problem includes:    Synthroid 125 Mcg Tabs (Levothyroxine sodium) .Chloe [name]... Take 1 by mouth once daily  Problem # 7:  Hx of COLON CANCER (ICD-153.9) She is due for f/u colonoscopy...  Problem # 8:  LOW BACK PAIN, CHRONIC (ICD-724.2) She has DJD, DDD, LBP> stable on OTC meds + Mobic etc... Her updated medication list for this problem includes:    Mobic 7.5 Mg Tabs (Meloxicam) .Chloe [name]... Take 1 tablet by mouth once a day  Problem # 9:  OTHER MEDICAL PROBLEMS AS NOTED>>> She's under considerable stress, but has wonderful support group from family etc... uses ALPRAZ 0.25mg  Prn.  Complete Medication List: 1)  Warfarin Sodium 5 Mg Tabs (Warfarin sodium) .... Use as directed by anticoagulation clinic 2)  Coreg 3.125 Mg Tabs (Carvedilol) .... Take 1 by mouth two times a day 3)  Accupril 20 Mg Tabs (Quinapril hcl) .Chloe [name].. 1 tab once daily 4)  Lasix 40  Mg Tabs (Furosemide) .... Take 1 tablet by mouth once a day 5)  Spironolactone 25 Mg Tabs (Spironolactone) .Chloe [name].. 1 tab by mouth once daily 6)  Klor-con 20 Meq Pack (Potassium chloride) .... Take 2 tabs by mouth two times a day... 7)  Lipitor 20 Mg Tabs (Atorvastatin calcium) .... Take 1 by mouth at bedtime 8)  Synthroid 125 Mcg Tabs (Levothyroxine sodium) .... Take 1 by mouth once daily 9)  Climara 0.1 Mg/24hr Ptwk (Estradiol) .Chloe [name].. 1 every week 10)  Mobic 7.5 Mg Tabs (Meloxicam) .... Take 1 tablet by mouth once  a day 11)  Caltrate 600+d 600-400 Mg-unit Tabs (Calcium carbonate-vitamin d) .... Take 1 by mouth once daily 12)  Centrum Silver Tabs (Multiple vitamins-minerals) .... Take 1 by mouth once daily 13)  Vitamin B-12 1000 Mcg Tabs (Cyanocobalamin) .... Take 1 tab daily... 14)  Alprazolam 0.25 Mg Tabs (Alprazolam) .... Take one tablet three times a day as needed fpr anxiety. 15)  Meclizine Hcl 25 Mg Tabs (Meclizine hcl) .... Take 1/2 to 1 tab by mouth every 6 h as needed for dizziness...  Patient Instructions: 1)  Today we updated your med list- see below.... 2)  Continue your current medications the same... 3)  Today we did your follow up CXR & FASTING blood work... .please call the "phone tree" in a few days for your lab results.Chloe KitchenMarland [name] 4)  Call for any problems.Chloe KitchenMarland [name] 5)  Please schedule a follow-up appointment in 6 months.

## 2010-12-28 NOTE — Letter (Signed)
Summary: Handout Printed  Printed Handout:  - Coumadin Instructions-w/out Meds 

## 2010-12-28 NOTE — Medication Information (Signed)
Summary: inr check pt having cath at 10:30/hms  Anticoagulant Therapy  Managed by: Weston Brass, PharmD Referring MD: Charlton Haws, MD PCP: Alroy Dust Supervising MD: Shirlee Latch MD, Freida Busman Indication 1: Atrial Fibrillation (ICD-427.31) Indication 2: delete Lab Used: [facility] Site: [facility] INR POC 1.1 INR RANGE 2 - 3  Dietary changes: no    Health status changes: no    Bleeding/hemorrhagic complications: no    Recent/future hospitalizations: yes       Details: having cath today   Any changes in medication regimen? no    Recent/future dental: no  Any missed doses?: yes     Details: Took last dose of Coumadin on Thursday  Is patient compliant with meds? yes       Allergies: 1)  ! Codeine  Anticoagulation Management History:      The patient is taking warfarin and comes in today for a routine follow up visit.  Positive risk factors for bleeding include an age of 75 years or older.  The bleeding index is 'intermediate risk'.  Positive CHADS2 values include History of HTN and Age > 44 years old.  The start date was 04/01/1998.  Her last INR was 2.2 RATIO.  Anticoagulation responsible provider: Shirlee Latch MD, Jocelynn Gioffre.  INR POC: 1.1.  Cuvette Lot#: [id number].  Exp: 06/2011.    Anticoagulation Management Assessment/Plan:      The patient's current anticoagulation dose is Warfarin sodium 5 mg tabs: Use as directed by Anticoagulation Clinic.  The target INR is 2 - 3.  The next INR is due 05/03/2010.  Anticoagulation instructions were given to patient.  Results were reviewed/authorized by Weston Brass, PharmD.  She was notified by Weston Brass PharmD.         Prior Anticoagulation Instructions: INR 2.2  Continue current dosing schedule.  Return to clinic in 4 weeks.    Current Anticoagulation Instructions: INR 1.1  Take 1 1/2 tablets today then resume same dose of 1 tablet every day except 1 1/2 tablets on Monday, Wednesday and Friday

## 2010-12-28 NOTE — Assessment & Plan Note (Signed)
Summary: consult before col per recall letter...em    History of Present Illness Visit Type: new patient  Primary GI MD: Yancey Flemings MD Primary Provider: Alroy Dust, MD  Requesting Provider: na Chief Complaint: Recall colon. Pt denies any GI complaints  History of Present Illness:   77 year old with multiple medical problems including hypertension, chronic atrial fibrillation for which she is on chronic Coumadin therapy, peripheral vascular disease, hyperlipidemia, hypothyroidism, osteoarthritis, peripheral neuropathy, and anxiety. She also has a history of colon cancer for which she underwent right hemicolectomy in 1994. She has undergone multiple followup surveillance colonoscopies, the most recent in April 2006. She is now due for followup surveillance. Her multiple chronic medical problems are stable. She is come off Coumadin in the past multiple times for her procedure work. She remains active. She is interested in followup surveillance.   GI Review of Systems      Denies abdominal pain, acid reflux, belching, bloating, chest pain, dysphagia with liquids, dysphagia with solids, heartburn, loss of appetite, nausea, vomiting, vomiting blood, weight loss, and  weight gain.        Denies anal fissure, black tarry stools, change in bowel habit, constipation, diarrhea, diverticulosis, fecal incontinence, heme positive stool, hemorrhoids, irritable bowel syndrome, jaundice, light color stool, liver problems, rectal bleeding, and  rectal pain.    Current Medications (verified): 1)  Warfarin Sodium 5 Mg Tabs (Warfarin Sodium) .... Use As Directed By Anticoagulation Clinic 2)  Coreg 3.125 Mg  Tabs (Carvedilol) .... Take 1 By Mouth Two Times A Day 3)  Accupril 20 Mg  Tabs (Quinapril Hcl) .Marland Kitchen.. 1 Tab Once Daily 4)  Lasix 40 Mg  Tabs (Furosemide) .... Take 1 Tablet By Mouth Once A Day 5)  Spironolactone 25 Mg Tabs (Spironolactone) .Marland Kitchen.. 1 Tab By Mouth Once Daily 6)  Klor-Con 20 Meq  Pack (Potassium  Chloride) .... Take 2 Tabs By Mouth Two Times A Day... 7)  Lipitor 20 Mg  Tabs (Atorvastatin Calcium) .... Take 1 By Mouth At Bedtime 8)  Synthroid 125 Mcg  Tabs (Levothyroxine Sodium) .... Take 1 By Mouth Once Daily 9)  Climara 0.1 Mg/24hr  Ptwk (Estradiol) .Marland Kitchen.. 1 Every Week 10)  Mobic 7.5 Mg Tabs (Meloxicam) .... Take 1 Tablet By Mouth Once A Day 11)  Caltrate 600+d 600-400 Mg-Unit  Tabs (Calcium Carbonate-Vitamin D) .... Take 1 By Mouth Once Daily 12)  Centrum Silver   Tabs (Multiple Vitamins-Minerals) .... Take 1 By Mouth Once Daily 13)  Vitamin B-12 1000 Mcg Tabs (Cyanocobalamin) .... Take 1 Tab Daily... 14)  Alprazolam 0.25 Mg  Tabs (Alprazolam) .... Take One Tablet Three Times A Day As Needed Fpr Anxiety. 15)  Meclizine Hcl 25 Mg  Tabs (Meclizine Hcl) .... Take 1/2 To 1 Tab By Mouth Every 6 H As Needed For Dizziness...  Allergies (verified): 1)  ! Codeine  Past History:  Past Medical History: Reviewed history from 05/01/2010 and no changes required. HYPERTENSION (ICD-401.9) ATRIAL FIBRILLATION (ICD-427.31) MITRAL VALVE DISORDER (ICD-424.0) ? of PATENT FORAMEN OVALE (ICD-745.5) PERIPHERAL VASCULAR DISEASE (ICD-443.9) VENOUS INSUFFICIENCY (ICD-459.81) HYPERLIPIDEMIA (ICD-272.4) IMPAIRED GLUCOSE TOLERANCE (ICD-271.3) HYPOTHYROIDISM (ICD-244.9) Hx of COLON CANCER (ICD-153.9) Hx of SPLENIC INFARCTION (ICD-289.59) UTERINE CANCER, HX OF (ICD-V10.42) Hx of URINARY TRACT INFECTION (ICD-599.0) OSTEOARTHRITIS (ICD-715.90) LOW BACK PAIN, CHRONIC (ICD-724.2) OSTEOPOROSIS (ICD-733.00) PERIPHERAL NEUROPATHY (ICD-356.9) ANXIETY (ICD-300.00) VITAMIN B12 DEFICIENCY (ICD-266.2) Colon Polyps Hemorrhoids  Past Surgical History: Reviewed history from 04/17/2010 and no changes required. S/P hysterectomy for uterine cancer S/P AP repair 5/07 by Latanya Presser S/P basal  cell skin cancer Moh's surgery S/P right hemicolectomy for colon cancer - 1994 S/P lumbar laminectomy for spinal stenosis L3-4  in 2006 S/P left carpal tunnel surgery 4/08 by DrSypher  Family History: Reviewed history from 06/22/2008 and no changes required. mother deceased at age 40  from cancer father deceased at age 56  from heart failure 1 brother deceased age 89 cancer 1 sister deceased age 2 stroke and heart problems  Social History: Retired---part time [employer] married no children social etoh  Review of Systems       The patient complains of heart murmur and heart rhythm changes.  The patient denies allergy/sinus, anemia, anxiety-new, arthritis/joint pain, back pain, blood in urine, breast changes/lumps, change in vision, confusion, cough, coughing up blood, depression-new, fainting, fatigue, fever, headaches-new, hearing problems, itching, menstrual pain, muscle pains/cramps, night sweats, nosebleeds, pregnancy symptoms, shortness of breath, skin rash, sleeping problems, sore throat, swelling of feet/legs, swollen lymph glands, thirst - excessive , urination - excessive , urination changes/pain, urine leakage, vision changes, and voice change.    Vital Signs:  Patient profile:   77 year old female Height:      71 inches Weight:      196 pounds BMI:     27.44 BSA:     2.09 Pulse rate:   76 / minute Pulse rhythm:   regular BP sitting:   132 / 80  (left arm) Cuff size:   regular  Vitals Entered By: Ok Anis CMA (May 02, 2010 2:41 PM)  Physical Exam  General:  Well developed, well nourished, no acute distress. Head:  Normocephalic and atraumatic. Eyes:  PERRLA, no icterus. Mouth:  No deformity or lesions Lungs:  Clear throughout to auscultation. Heart:  irregular irregular Abdomen:  Soft, nontender and nondistended. No masses, hepatosplenomegaly or hernias noted. Normal bowel sounds.. Prior surgical incisions well-healed Rectal:  deferred until colonoscopy Msk:  Symmetrical with no gross deformities. Normal posture. Pulses:  irregular irregular Extremities:  no edema Neurologic:  Alert and   oriented x4;  Skin:  Intact without significant lesions or rashes. Psych:  Alert and cooperative. Normal mood and affect.   Impression & Recommendations:  Problem # 1:  PERSONAL HX COLON CANCER (ICD-V33.21) 77 year old with multiple medical problems. She has a history of colon cancer for which he underwent resection in 1994. Her last colonoscopy in 2006 as mentioned. Currently stable and interested in followup surveillance. No contraindications.  Plan: #1. Colonoscopy. The nature of the procedure as well as the risks, benefits, and alternatives were reviewed in detail. She understood and agreed to proceed #2. Movi prep prescribed. The patient instructed on its use #3. Hold Coumadin 4 days prior to the procedure. She understands the relative risks of performing the exam on Coumadin as opposed to off Coumadin  Other Orders: Colonoscopy (Colon)  Patient Instructions: 1)  Colonoscopy [facility] 05/25/10 4:00 pm arrive at 3:00 pm 2)  Movi prep instructions given to patient. 3)  Movi prep Rx. sent to your pharmacy for you to pick up. 4)  Colonoscopy and Flexible Sigmoidoscopy brochure given.  5)  Hold Coumadin x 4 days starting on 05/21/10. 6)  The medication list was reviewed and reconciled.  All changed / newly prescribed medications were explained.  A complete medication list was provided to the patient / caregiver. 7)  printed and given to patient. Milford Cage [facility]  May 02, 2010 3:29 PM Prescriptions: MOVIPREP 100 GM  SOLR (PEG-KCL-NACL-NASULF-NA ASC-C) As per prep instructions.  #1 x  0   Entered by:   Milford Cage NCMA   Authorized by:   Hilarie Fredrickson MD   Signed by:   Milford Cage NCMA on 05/02/2010   Method used:   Electronically to        [facility]  [phone][facility] (retail)       [address] [place], [place]  [address]       Ph: [phone] or [phone]       Fax: [phone]   RxID:   [id number]

## 2010-12-28 NOTE — Progress Notes (Signed)
Summary: high b/p since yesterday/   Medications Added ACCUPRIL 20 MG  TABS (QUINAPRIL HCL) take one tab by mouth twice daily       Phone Note Call from Patient Call back at Home Phone [phone]   Caller: Patient Reason for Call: Talk to Nurse Details for Reason: not feeling well since the weekend. sob, discomfort on the left sided. b/p yesterday 204/114 . pt didn't go to er. took medication .  Initial call taken by: Lorne Skeens,  December 12, 2009 8:46 AM  Follow-up for Phone Call        Hasn't felt well for past few weeks. Went out to eat Sat., felt discomfort (like she had eaten too much), describes like indegestion. She is in AF and has been for years (on Coumadin). Yesterday felt really bad, checked BP 204/114, HR 120. She took evening meds and rechecked BP 188/114 HR 108.,  rechecked later 168/104 (took extra Coreg). At 2200 checked again 141/86 HR 76. Today it is 164/92 HR 80 (prior to meds). Pain around left shoulder blade, has been going on for a couple months. Pt has been under a lot of stress with husband being checked for pancreatic cancer.   Additional Follow-up for Phone Call Additional follow up Details #1::        Per Dr. [name], she is to increase her Accupril to 40mg  daily and see him in the office in 2 weeks. Pt aware of plan and will f/u in office on 12/26/09. Refill sent to [facility] with increased dose. Additional Follow-up by: Duncan Dull, RN, BSN,  December 12, 2009 9:44 AM    New/Updated Medications: ACCUPRIL 20 MG  TABS (QUINAPRIL HCL) take one tab by mouth twice daily Prescriptions: ACCUPRIL 20 MG  TABS (QUINAPRIL HCL) take one tab by mouth twice daily  #180 x 4   Entered by:   Duncan Dull, RN, BSN   Authorized by:   Colon Branch, MD, [facility]   Signed by:   Duncan Dull, RN, BSN on 12/12/2009   Method used:   Electronically to        [facility]* (mail-order)             ,          Ph: [phone]       Fax: [phone]   RxID:    [id number]

## 2010-12-28 NOTE — Assessment & Plan Note (Signed)
Summary: 6 mo f/u ./cy      Allergies Added:   Referring Provider:  na  Primary Provider:  Alroy Dust, MD    History of Present Illness: [name] is seen today for f/U of afib, HTN, and edema.  Her coumadin has been Rx.  No palpitations, SOB or SSCP.  Her husband [name] passed a few weeks ago after a prolonged illness with pancreatic like cancer.  This is actually a relief for [name] as he was very difficult during the last 18 months.  She needs a refill on her coumadin.  She is walking on a regular basis without symptoms.  She is  working at [employer] again and she enjoys this.    Her diuretic regiman is complicated as she needs aldactone to avoid taking more than 6 KCL tablets/day She had some bleeding when enrolled in the engage trial and we have not thought about changing her to Pradaxa  Current Problems (verified): 1)  Abnormal Cv (STRESS) Test  (ICD-794.39) 2)  Coumadin Therapy  (ICD-V58.61) 3)  Hypertension  (ICD-401.9) 4)  Chest Pain Unspecified  (ICD-786.50) 5)  Atrial Fibrillation  (ICD-427.31) 6)  Mitral Valve Disorder  (ICD-424.0) 7)  ? of Patent Foramen Ovale  (ICD-745.5) 8)  Peripheral Vascular Disease  (ICD-443.9) 9)  Venous Insufficiency  (ICD-459.81) 10)  Hyperlipidemia  (ICD-272.4) 11)  Impaired Glucose Tolerance  (ICD-271.3) 12)  Hypothyroidism  (ICD-244.9) 13)  Diverticulosis of Colon  (ICD-562.10) 14)  Colonic Polyps  (ICD-211.3) 15)  Hx of Colon Cancer  (ICD-153.9) 16)  Hx of Splenic Infarction  (ICD-289.59) 17)  Uterine Cancer, Hx of  (ICD-V10.42) 18)  Hx of Urinary Tract Infection  (ICD-599.0) 19)  Osteoarthritis  (ICD-715.90) 20)  Low Back Pain, Chronic  (ICD-724.2) 21)  Osteoporosis  (ICD-733.00) 22)  Peripheral Neuropathy  (ICD-356.9) 23)  Anxiety  (ICD-300.00) 24)  Vitamin B12 Deficiency  (ICD-266.2)  Current Medications (verified): 1)  Warfarin Sodium 5 Mg Tabs (Warfarin Sodium) .... Use As Directed By Anticoagulation Clinic 2)  Coreg 3.125 Mg  Tabs  (Carvedilol) .... Take 1 By Mouth Two Times A Day 3)  Accupril 20 Mg  Tabs (Quinapril Hcl) .Marland Kitchen.. 1 Tab Once Daily 4)  Lasix 40 Mg  Tabs (Furosemide) .... Take 1 Tablet By Mouth Once A Day 5)  Spironolactone 25 Mg Tabs (Spironolactone) .Marland Kitchen.. 1 Tab By Mouth Once Daily 6)  Klor-Con 20 Meq  Pack (Potassium Chloride) .... Take 2 Tabs By Mouth Two Times A Day... 7)  Lipitor 20 Mg  Tabs (Atorvastatin Calcium) .... Take 1 By Mouth At Bedtime 8)  Synthroid 125 Mcg  Tabs (Levothyroxine Sodium) .... Take 1 By Mouth Once Daily 9)  Climara 0.1 Mg/24hr  Ptwk (Estradiol) .Marland Kitchen.. 1 Every Week 10)  Mobic 7.5 Mg Tabs (Meloxicam) .... Take 1 Tablet By Mouth Once A Day 11)  Caltrate 600+d 600-400 Mg-Unit  Tabs (Calcium Carbonate-Vitamin D) .... Take 1 By Mouth Once Daily 12)  Centrum Silver   Tabs (Multiple Vitamins-Minerals) .... Take 1 By Mouth Once Daily 13)  Vitamin B-12 1000 Mcg Tabs (Cyanocobalamin) .... Take 1 Tab Daily... 14)  Alprazolam 0.25 Mg  Tabs (Alprazolam) .... Take One Tablet Three Times A Day As Needed Fpr Anxiety. 15)  Meclizine Hcl 25 Mg  Tabs (Meclizine Hcl) .... Take 1/2 To 1 Tab By Mouth Every 6 H As Needed For Dizziness...  Allergies (verified): 1)  ! Codeine  Past History:  Past Medical History: Last updated: 10/16/2010 HYPERTENSION (ICD-401.9) CHEST PAIN UNSPECIFIED (  ICD-786.50) ATRIAL FIBRILLATION (ICD-427.31) MITRAL VALVE DISORDER (ICD-424.0) ? of PATENT FORAMEN OVALE (ICD-745.5) PERIPHERAL VASCULAR DISEASE (ICD-443.9) VENOUS INSUFFICIENCY (ICD-459.81) HYPERLIPIDEMIA (ICD-272.4) IMPAIRED GLUCOSE TOLERANCE (ICD-271.3) HYPOTHYROIDISM (ICD-244.9) DIVERTICULOSIS OF COLON (ICD-562.10) COLONIC POLYPS (ICD-211.3) Hx of COLON CANCER (ICD-153.9) Hx of SPLENIC INFARCTION (ICD-289.59) UTERINE CANCER, HX OF (ICD-V10.42) Hx of URINARY TRACT INFECTION (ICD-599.0) OSTEOARTHRITIS (ICD-715.90) LOW BACK PAIN, CHRONIC (ICD-724.2) OSTEOPOROSIS (ICD-733.00) PERIPHERAL NEUROPATHY  (ICD-356.9) ANXIETY (ICD-300.00) VITAMIN B12 DEFICIENCY (ICD-266.2)  Past Surgical History: Last updated: 10/27/10 S/P hysterectomy for uterine cancer S/P AP repair 5/07 by Latanya Presser S/P basal cell skin cancer Moh's surgery S/P right hemicolectomy for colon cancer - 1994 S/P lumbar laminectomy for spinal stenosis L3-4 in 2006 S/P left carpal tunnel surgery 4/08 by DrSypher  Family History: Last updated: 10/27/2010 mother deceased at age 50  from cancer father deceased at age 4  from heart failure 1 brother deceased age 19 cancer 1 sister deceased age 15 stroke and heart problems  Social History: Last updated: 05/02/2010 Retired---part time [employer] married no children social etoh  Review of Systems       Denies fever, malais, weight loss, blurry vision, decreased visual acuity, cough, sputum, SOB, hemoptysis, pleuritic pain, palpitaitons, heartburn, abdominal pain, melena, lower extremity edema, claudication, or rash.   Vital Signs:  Patient profile:   77 year old female Height:      71 inches Weight:      186 pounds BMI:     26.04 Pulse rate:   69 / minute Resp:     14 per minute BP sitting:   140 / 84  (left arm)  Vitals Entered By: Kem Parkinson (October 30, 2010 8:59 AM)  Physical Exam  General:  Affect appropriate Healthy:  appears stated age HEENT: normal Neck supple with no adenopathy JVP normal no bruits no thyromegaly Lungs clear with no wheezing and good diaphragmatic motion Heart:  S1/S2 systolic  murmur, no rub, gallop or click PMI normal Abdomen: benighn, BS positve, no tenderness, no AAA no bruit.  No HSM or HJR Distal pulses intact with no bruits No edema Neuro non-focal Skin warm and dry    Impression & Recommendations:  Problem # 1:  COUMADIN THERAPY (ICD-V58.61) F/U clinic  No TIA symptoms  Problem # 2:  HYPERTENSION (ICD-401.9) Fairly well controlled  Continue 4 drug Rx Her updated medication list for this problem includes:     Coreg 3.125 Mg Tabs (Carvedilol) .Marland Kitchen... Take 1 by mouth two times a day    Accupril 20 Mg Tabs (Quinapril hcl) .Marland Kitchen... 1 tab once daily    Lasix 40 Mg Tabs (Furosemide) .Marland Kitchen... Take 1 tablet by mouth once a day    Spironolactone 25 Mg Tabs (Spironolactone) .Marland Kitchen... 1 tab by mouth once daily  Problem # 3:  ATRIAL FIBRILLATION (ICD-427.31) Mild palpitatons only at night  good rate control and anticoagulaiton Her updated medication list for this problem includes:    Warfarin Sodium 5 Mg Tabs (Warfarin sodium) ..... Use as directed by anticoagulation clinic    Coreg 3.125 Mg Tabs (Carvedilol) .Marland Kitchen... Take 1 by mouth two times a day  Problem # 4:  MITRAL VALVE DISORDER (ICD-424.0) No change in murmur.  f/U echo in a year Her updated medication list for this problem includes:    Coreg 3.125 Mg Tabs (Carvedilol) .Marland Kitchen... Take 1 by mouth two times a day    Accupril 20 Mg Tabs (Quinapril hcl) .Marland Kitchen... 1 tab once daily    Lasix 40 Mg Tabs (Furosemide) .Marland Kitchen... Take  1 tablet by mouth once a day    Spironolactone 25 Mg Tabs (Spironolactone) .Marland Kitchen... 1 tab by mouth once daily  Patient Instructions: 1)  Your physician recommends that you schedule a follow-up appointment in: 6 MONTHS WITH DR Laurence Slate 2)  Your physician recommends that you continue on your current medications as directed. Please refer to the Current Medication list given to you today.

## 2010-12-28 NOTE — Medication Information (Signed)
Summary: ccr  Anticoagulant Therapy  Managed by: Cloyde Reams, RN, BSN Referring MD: Charlton Haws MD Supervising MD: Johney Frame MD, Fayrene Fearing Indication 1: Atrial Fibrillation (ICD-427.31) Indication 2: delete Lab Used: [facility] Site: [facility] INR POC 1.7 INR RANGE 2 - 3  Dietary changes: no    Health status changes: no    Bleeding/hemorrhagic complications: no    Recent/future hospitalizations: no    Any changes in medication regimen? no    Recent/future dental: no  Any missed doses?: no       Is patient compliant with meds? yes       Allergies (verified): 1)  ! Codeine  Anticoagulation Management History:      The patient is taking warfarin and comes in today for a routine follow up visit.  Positive risk factors for bleeding include an age of 75 years or older.  The bleeding index is 'intermediate risk'.  Positive CHADS2 values include History of HTN and Age > 31 years old.  The start date was 04/01/1998.  Her last INR was 2.2 RATIO.  Anticoagulation responsible provider: Ame Heagle MD, Fayrene Fearing.  INR POC: 1.7.  Cuvette Lot#: [id number].  Exp: 03/2011.    Anticoagulation Management Assessment/Plan:      The patient's current anticoagulation dose is Warfarin sodium 5 mg tabs: Use as directed by Anticoagulation Clinic.  The target INR is 2 - 3.  The next INR is due 03/29/2010.  Anticoagulation instructions were given to patient.  Results were reviewed/authorized by Cloyde Reams, RN, BSN.  She was notified by Cloyde Reams RN.         Prior Anticoagulation Instructions: INR 1.9  Take extra 0.5 tab tomorrow, then resume 1.5 tabs each Monday and Friday and 1 tab on all other days.  Recheck in 4 weeks.   Current Anticoagulation Instructions: INR 1.7  Start taking 1 tablet daily except 1.5 tablets on Mondays, Wednesdays, and Fridays.  Recheck in 3 weeks.

## 2010-12-28 NOTE — Medication Information (Signed)
Summary: rov/ewj  Anticoagulant Therapy  Managed by: Bethena Midget, RN, BSN Referring MD: Charlton Haws MD Supervising MD: Shirlee Latch MD, Aerionna Moravek Indication 1: Atrial Fibrillation (ICD-427.31) Indication 2: See every 2 months per Nishan.. (ICD-111) Lab Used: [facility] Site: [facility] INR POC 1.6 INR RANGE 2 - 3  Dietary changes: no    Health status changes: no    Bleeding/hemorrhagic complications: no    Recent/future hospitalizations: no    Any changes in medication regimen? yes       Details: Accupril decreased back to 1 tablet daily   Recent/future dental: no  Any missed doses?: no       Is patient compliant with meds? yes      Comments: Saw Dr. [name] today.   Allergies: 1)  ! Codeine  Anticoagulation Management History:      The patient is taking warfarin and comes in today for a routine follow up visit.  Positive risk factors for bleeding include an age of 75 years or older.  The bleeding index is 'intermediate risk'.  Positive CHADS2 values include History of HTN and Age > 75 years old.  The start date was 04/01/1998.  Her last INR was 2.2 RATIO.  Anticoagulation responsible provider: Shirlee Latch MD, Carolyn Sylvia.  INR POC: 1.6.  Cuvette Lot#: [id number].  Exp: 02/2011.    Anticoagulation Management Assessment/Plan:      The patient's current anticoagulation dose is Warfarin sodium 5 mg tabs: Use as directed by Anticoagulation Clinic.  The target INR is 2 - 3.  The next INR is due 01/04/2010.  Anticoagulation instructions were given to patient.  Results were reviewed/authorized by Bethena Midget, RN, BSN.  She was notified by Bethena Midget, RN, BSN.         Prior Anticoagulation Instructions: INR 1.7  Take 1.5 tablets todaythen resume same dosage 1 tablet daily except 1.5 tablets on Sundays.    Current Anticoagulation Instructions: INR 1.6 Today take 7.5mg s then change dose to 5mg s everyday except 7.5mg s on Mondays and Fridays. Recheck in 2 weeks.

## 2010-12-28 NOTE — Assessment & Plan Note (Signed)
Summary: HTN (401.9)/ MMR      Allergies Added:   CC:  pt states she had BP increase this is a follow up from this issue.  History of Present Illness: [name] is seen today for F/U of HTN , edema , afib and anticoagulation.  Her husband [name] is dying a slow death from probable pancreatic cancer.  We talked about this a lot.  She had severe elevation in her BP a couple of weeks ago but is low now.  We will cut her accupril back to once/day and check a BMET to make sure she is not prerenal.  She denies palpitations, edema, SSCP or dyspnea.  She will have her INR checked today.  There have been on TIA or bleeding problems.  Current Problems (verified): 1)  Hypertension  (ICD-401.9) 2)  Atrial Fibrillation  (ICD-427.31) 3)  Mitral Valve Disorder  (ICD-424.0) 4)  ? of Patent Foramen Ovale  (ICD-745.5) 5)  Peripheral Vascular Disease  (ICD-443.9) 6)  Venous Insufficiency  (ICD-459.81) 7)  Hyperlipidemia  (ICD-272.4) 8)  Impaired Glucose Tolerance  (ICD-271.3) 9)  Hypothyroidism  (ICD-244.9) 10)  Hx of Colon Cancer  (ICD-153.9) 11)  Hx of Splenic Infarction  (ICD-289.59) 12)  Uterine Cancer, Hx of  (ICD-V10.42) 13)  Hx of Urinary Tract Infection  (ICD-599.0) 14)  Osteoarthritis  (ICD-715.90) 15)  Low Back Pain, Chronic  (ICD-724.2) 16)  Osteoporosis  (ICD-733.00) 17)  Peripheral Neuropathy  (ICD-356.9) 18)  Anxiety  (ICD-300.00) 19)  Vitamin B12 Deficiency  (ICD-266.2)  Current Medications (verified): 1)  Coreg 3.125 Mg  Tabs (Carvedilol) .... Take 1 By Mouth Two Times A Day 2)  Accupril 20 Mg  Tabs (Quinapril Hcl) .... Take One Tab By Mouth Twice Daily 3)  Lasix 40 Mg  Tabs (Furosemide) .... Take 1 Tablet By Mouth Once A Day 4)  Spironolactone 25 Mg Tabs (Spironolactone) .Marland Kitchen.. 1 Tab By Mouth Once Daily 5)  Klor-Con 20 Meq  Pack (Potassium Chloride) .... Take 2 Tabs By Mouth Two Times A Day... 6)  Lipitor 20 Mg  Tabs (Atorvastatin Calcium) .... Take 1 By Mouth At Bedtime 7)  Synthroid  125 Mcg  Tabs (Levothyroxine Sodium) .... Take 1 By Mouth Once Daily 8)  Climara 0.1 Mg/24hr  Ptwk (Estradiol) .Marland Kitchen.. 1 Every Week 9)  Caltrate 600+d 600-400 Mg-Unit  Tabs (Calcium Carbonate-Vitamin D) .... Take 1 By Mouth Once Daily 10)  Centrum Silver   Tabs (Multiple Vitamins-Minerals) .... Take 1 By Mouth Once Daily 11)  Vitamin B-12 1000 Mcg Tabs (Cyanocobalamin) .... Take 1 Tab Daily... 12)  Mobic 7.5 Mg  Tabs (Meloxicam) .Marland Kitchen.. 1 By Mouth Once Daily As Needed Joint Pain 13)  Tramadol Hcl 50 Mg  Tabs (Tramadol Hcl) .Marland Kitchen.. 1 By Mouth Every 4 Hrs As Needed. 14)  Alprazolam 0.25 Mg  Tabs (Alprazolam) .... Take One Tablet Three Times A Day As Needed Fpr Anxiety. 15)  Meclizine Hcl 25 Mg  Tabs (Meclizine Hcl) .... Take 1/2 To 1 Tab By Mouth Every 6 H As Needed For Dizziness... 16)  Warfarin Sodium 5 Mg Tabs (Warfarin Sodium) .... Use As Directed By Anticoagulation Clinic 17)  Magic Mouthwash .... 1 Tsp- Gargle & Swallow Up To 4 Times Daily As Needed For Sore Throat...  Allergies (verified): 1)  ! Codeine  Past History:  Past Medical History: Last updated: 10/17/2009 HYPERTENSION (ICD-401.9) ATRIAL FIBRILLATION (ICD-427.31) MITRAL VALVE DISORDER (ICD-424.0) ? of PATENT FORAMEN OVALE (ICD-745.5) PERIPHERAL VASCULAR DISEASE (ICD-443.9) VENOUS INSUFFICIENCY (ICD-459.81) HYPERLIPIDEMIA (ICD-272.4) IMPAIRED  GLUCOSE TOLERANCE (ICD-271.3) HYPOTHYROIDISM (ICD-244.9) Hx of COLON CANCER (ICD-153.9) Hx of SPLENIC INFARCTION (ICD-289.59) UTERINE CANCER, HX OF (ICD-V10.42) Hx of URINARY TRACT INFECTION (ICD-599.0) OSTEOARTHRITIS (ICD-715.90) LOW BACK PAIN, CHRONIC (ICD-724.2) OSTEOPOROSIS (ICD-733.00) PERIPHERAL NEUROPATHY (ICD-356.9) ANXIETY (ICD-300.00) VITAMIN B12 DEFICIENCY (ICD-266.2)  Past Surgical History: Last updated: 10/17/2009 S/P hysterectomy for uterine cancer S/P AP repair 5/07 by Latanya Presser S/P basal cell skin cancer Moh's surgery S/P right hemicolectomy for colon cancer -  1994 S/P lumbar laminectomy for spinal stenosis L3-4 in 2006 S/P left carpal tunnel surgery 4/08 by DrSypher  Family History: Last updated: 07-10-08 mother deceased at age 11  from cancer father deceased at age 50  from heart failure 1 brother deceased age 38 cancer 1 sister deceased age 106 stroke and heart problems  Social History: Last updated: 07/10/2008 Retired married no children social etoh  Review of Systems       Denies fever, malais, weight loss, blurry vision, decreased visual acuity, cough, sputum, SOB, hemoptysis, pleuritic pain, palpitaitons, heartburn, abdominal pain, melena, lower extremity edema, claudication, or rash. All other systems reviewed and negative  Vital Signs:  Patient profile:   77 year old female Height:      71 inches Weight:      189 pounds BMI:     26.46 Pulse rate:   72 / minute Resp:     12 per minute BP sitting:   96 / 64  (left arm)  Vitals Entered By: Kem Parkinson (December 26, 2009 10:25 AM)  Physical Exam  General:  Affect appropriate Healthy:  appears stated age HEENT: normal Neck supple with no adenopathy JVP normal no bruits no thyromegaly Lungs clear with no wheezing and good diaphragmatic motion Heart:  S1/S2 systolic murmur no ,rub, gallop or click PMI normal Abdomen: benighn, BS positve, no tenderness, no AAA no bruit.  No HSM or HJR Distal pulses intact with no bruits No edema Neuro non-focal Skin warm and dry    Impression & Recommendations:  Problem # 1:  HYPERTENSION (ICD-401.9) Low , will decrease accupril and check BMET Her updated medication list for this problem includes:    Coreg 3.125 Mg Tabs (Carvedilol) .Marland Kitchen... Take 1 by mouth two times a day    Accupril 20 Mg Tabs (Quinapril hcl) .Marland Kitchen... Take one tab by mouth twice daily    Lasix 40 Mg Tabs (Furosemide) .Marland Kitchen... Take 1 tablet by mouth once a day    Spironolactone 25 Mg Tabs (Spironolactone) .Marland Kitchen... 1 tab by mouth once daily  Orders: TLB-BMP (Basic  Metabolic Panel-BMET) (80048-METABOL)  Problem # 2:  ATRIAL FIBRILLATION (ICD-427.31) Good rate control.  Check INR today Her updated medication list for this problem includes:    Coreg 3.125 Mg Tabs (Carvedilol) .Marland Kitchen... Take 1 by mouth two times a day    Warfarin Sodium 5 Mg Tabs (Warfarin sodium) ..... Use as directed by anticoagulation clinic  Problem # 3:  VENOUS INSUFFICIENCY (ICD-459.81) No edema hopefully can continue current dose of Lasix if BMET ok  Problem # 4:  MITRAL VALVE DISORDER (ICD-424.0) Stable murmur.  F/U echo in 6 months Her updated medication list for this problem includes:    Coreg 3.125 Mg Tabs (Carvedilol) .Marland Kitchen... Take 1 by mouth two times a day    Accupril 20 Mg Tabs (Quinapril hcl) .Marland Kitchen... Take one tab by mouth twice daily    Lasix 40 Mg Tabs (Furosemide) .Marland Kitchen... Take 1 tablet by mouth once a day    Spironolactone 25 Mg Tabs (Spironolactone) .Marland KitchenMarland KitchenMarland KitchenMarland Kitchen 1  tab by mouth once daily  Patient Instructions: 1)  Your physician recommends that you schedule a follow-up appointment in: 3 MONTHS

## 2010-12-28 NOTE — Progress Notes (Signed)
Summary: refill   Phone Note Refill Request Message from:  Patient on Mar 28, 2010 9:13 AM  Refills Requested: Medication #1:  SYNTHROID 125 MCG  TABS take 1 by mouth once daily  Medication #2:  LIPITOR 20 MG  TABS take 1 by mouth at bedtime  Medication #3:  COREG 3.125 MG  TABS take 1 by mouth two times a day  Medication #4:  WARFARIN SODIUM 5 MG TABS Use as directed by Anticoagulation Clinic. Send to [facility] [phone]  Initial call taken by: Judie Grieve,  Mar 28, 2010 9:14 AM    Prescriptions: SYNTHROID 125 MCG  TABS (LEVOTHYROXINE SODIUM) take 1 by mouth once daily  #90 x 3   Entered by:   Kem Parkinson   Authorized by:   Colon Branch, MD, [facility]   Signed by:   Kem Parkinson on 03/28/2010   Method used:   Electronically to        [facility] (mail-order)             ,          Ph: [phone]       Fax: [phone]   RxID:   [id number] LIPITOR 20 MG  TABS (ATORVASTATIN CALCIUM) take 1 by mouth at bedtime  #90 x 3   Entered by:   Kem Parkinson   Authorized by:   Colon Branch, MD, [facility]   Signed by:   Kem Parkinson on 03/28/2010   Method used:   Electronically to        [facility] (mail-order)             ,          Ph: [phone]       Fax: [phone]   RxID:   [id number] COREG 3.125 MG  TABS (CARVEDILOL) take 1 by mouth two times a day  #180 x 3   Entered by:   Kem Parkinson   Authorized by:   Colon Branch, MD, [facility]   Signed by:   Kem Parkinson on 03/28/2010   Method used:   Electronically to        [facility] (mail-order)             ,          Ph: [phone]       Fax: [phone]   RxID:   [id number]   Appended Document: refill          Additional Follow-up for Phone Call Additional follow up Details #2::    Sent Flag to coumadin clinic about refill  Follow-up by: Kem Parkinson,  Mar 28, 2010 10:32 AM

## 2010-12-28 NOTE — Assessment & Plan Note (Signed)
Summary: PER CHECK OUT/SF      Allergies Added:   Referring Provider:  na  Primary Provider:  Alroy Dust, MD    History of Present Illness: [name] is seen today for f/U of afib, HTN, and edema.  Her coumadin has been Rx.  No palpitations, SOB or SSCP.  Her husband [name] passed a few weeks ago after a prolonged illness with pancreatic like cancer.  This is actually a relief for [name] as he was very difficult during the last 18 months.  She needs a refill on her coumadin.  She is walking on a regular basis without symptoms.  She is not working at [employer] for the time being   Current Problems (verified): 1)  Personal Hx Colon Cancer  (ICD-V10.05) 2)  Abnormal Cv (STRESS) Test  (ICD-794.39) 3)  Coumadin Therapy  (ICD-V58.61) 4)  Chest Pain Unspecified  (ICD-786.50) 5)  Hypertension  (ICD-401.9) 6)  Atrial Fibrillation  (ICD-427.31) 7)  Mitral Valve Disorder  (ICD-424.0) 8)  ? of Patent Foramen Ovale  (ICD-745.5) 9)  Peripheral Vascular Disease  (ICD-443.9) 10)  Venous Insufficiency  (ICD-459.81) 11)  Hyperlipidemia  (ICD-272.4) 12)  Impaired Glucose Tolerance  (ICD-271.3) 13)  Hypothyroidism  (ICD-244.9) 14)  Hx of Colon Cancer  (ICD-153.9) 15)  Hx of Splenic Infarction  (ICD-289.59) 16)  Uterine Cancer, Hx of  (ICD-V10.42) 17)  Hx of Urinary Tract Infection  (ICD-599.0) 18)  Osteoarthritis  (ICD-715.90) 19)  Low Back Pain, Chronic  (ICD-724.2) 20)  Osteoporosis  (ICD-733.00) 21)  Peripheral Neuropathy  (ICD-356.9) 22)  Anxiety  (ICD-300.00) 23)  Vitamin B12 Deficiency  (ICD-266.2)  Current Medications (verified): 1)  Warfarin Sodium 5 Mg Tabs (Warfarin Sodium) .... Use As Directed By Anticoagulation Clinic 2)  Coreg 3.125 Mg  Tabs (Carvedilol) .... Take 1 By Mouth Two Times A Day 3)  Accupril 20 Mg  Tabs (Quinapril Hcl) .Marland Kitchen.. 1 Tab Once Daily 4)  Lasix 40 Mg  Tabs (Furosemide) .... Take 1 Tablet By Mouth Once A Day 5)  Spironolactone 25 Mg Tabs (Spironolactone) .Marland Kitchen.. 1 Tab By  Mouth Once Daily 6)  Klor-Con 20 Meq  Pack (Potassium Chloride) .... Take 2 Tabs By Mouth Two Times A Day... 7)  Lipitor 20 Mg  Tabs (Atorvastatin Calcium) .... Take 1 By Mouth At Bedtime 8)  Synthroid 125 Mcg  Tabs (Levothyroxine Sodium) .... Take 1 By Mouth Once Daily 9)  Climara 0.1 Mg/24hr  Ptwk (Estradiol) .Marland Kitchen.. 1 Every Week 10)  Mobic 7.5 Mg Tabs (Meloxicam) .... Take 1 Tablet By Mouth Once A Day 11)  Caltrate 600+d 600-400 Mg-Unit  Tabs (Calcium Carbonate-Vitamin D) .... Take 1 By Mouth Once Daily 12)  Centrum Silver   Tabs (Multiple Vitamins-Minerals) .... Take 1 By Mouth Once Daily 13)  Vitamin B-12 1000 Mcg Tabs (Cyanocobalamin) .... Take 1 Tab Daily... 14)  Alprazolam 0.25 Mg  Tabs (Alprazolam) .... Take One Tablet Three Times A Day As Needed Fpr Anxiety. 15)  Meclizine Hcl 25 Mg  Tabs (Meclizine Hcl) .... Take 1/2 To 1 Tab By Mouth Every 6 H As Needed For Dizziness...  Allergies (verified): 1)  ! Codeine  Past History:  Past Medical History: Last updated: 05/01/2010 HYPERTENSION (ICD-401.9) ATRIAL FIBRILLATION (ICD-427.31) MITRAL VALVE DISORDER (ICD-424.0) ? of PATENT FORAMEN OVALE (ICD-745.5) PERIPHERAL VASCULAR DISEASE (ICD-443.9) VENOUS INSUFFICIENCY (ICD-459.81) HYPERLIPIDEMIA (ICD-272.4) IMPAIRED GLUCOSE TOLERANCE (ICD-271.3) HYPOTHYROIDISM (ICD-244.9) Hx of COLON CANCER (ICD-153.9) Hx of SPLENIC INFARCTION (ICD-289.59) UTERINE CANCER, HX OF (ICD-V10.42) Hx of URINARY TRACT INFECTION (ICD-599.0)  OSTEOARTHRITIS (ICD-715.90) LOW BACK PAIN, CHRONIC (ICD-724.2) OSTEOPOROSIS (ICD-733.00) PERIPHERAL NEUROPATHY (ICD-356.9) ANXIETY (ICD-300.00) VITAMIN B12 DEFICIENCY (ICD-266.2) Colon Polyps Hemorrhoids  Past Surgical History: Last updated: 04/17/2010 S/P hysterectomy for uterine cancer S/P AP repair 5/07 by Latanya Presser S/P basal cell skin cancer Moh's surgery S/P right hemicolectomy for colon cancer - 1994 S/P lumbar laminectomy for spinal stenosis L3-4 in 2006 S/P  left carpal tunnel surgery 4/08 by DrSypher  Family History: Last updated: 07/08/2008 mother deceased at age 44  from cancer father deceased at age 67  from heart failure 1 brother deceased age 68 cancer 1 sister deceased age 23 stroke and heart problems  Social History: Last updated: 05/02/2010 Retired---part time [employer] married no children social etoh  Review of Systems       Denies fever, malais, weight loss, blurry vision, decreased visual acuity, cough, sputum, SOB, hemoptysis, pleuritic pain, palpitaitons, heartburn, abdominal pain, melena, lower extremity edema, claudication, or rash.   Vital Signs:  Patient profile:   77 year old female Height:      71 inches Weight:      189 pounds BMI:     26.46 Pulse rate:   71 / minute Resp:     12 per minute BP sitting:   106 / 69  (left arm)  Vitals Entered By: Kem Parkinson (June 29, 2010 8:42 AM)  Physical Exam  General:  Affect appropriate Healthy:  appears stated age HEENT: normal Neck supple with no adenopathy JVP normal no bruits no thyromegaly Lungs clear with no wheezing and good diaphragmatic motion Heart:  S1/S2 SEM  murmur,rub, gallop or click PMI normal Abdomen: benighn, BS positve, no tenderness, no AAA no bruit.  No HSM or HJR Distal pulses intact with no bruits No edema Neuro non-focal Skin warm and dry    Impression & Recommendations:  Problem # 1:  HYPERTENSION (ICD-401.9) Well controlled Her updated medication list for this problem includes:    Coreg 3.125 Mg Tabs (Carvedilol) .Marland Kitchen... Take 1 by mouth two times a day    Accupril 20 Mg Tabs (Quinapril hcl) .Marland Kitchen... 1 tab once daily    Lasix 40 Mg Tabs (Furosemide) .Marland Kitchen... Take 1 tablet by mouth once a day    Spironolactone 25 Mg Tabs (Spironolactone) .Marland Kitchen... 1 tab by mouth once daily  Problem # 2:  ATRIAL FIBRILLATION (ICD-427.31) Good rate controll Her updated medication list for this problem includes:    Warfarin Sodium 5 Mg Tabs (Warfarin  sodium) ..... Use as directed by anticoagulation clinic    Coreg 3.125 Mg Tabs (Carvedilol) .Marland Kitchen... Take 1 by mouth two times a day  Problem # 3:  HYPERLIPIDEMIA (ICD-272.4) At goal with no side effects Her updated medication list for this problem includes:    Lipitor 20 Mg Tabs (Atorvastatin calcium) .Marland Kitchen... Take 1 by mouth at bedtime  CHOL: 177 (04/17/2010)   LDL: 93 (04/17/2010)   HDL: 50.60 (04/17/2010)   TG: 167.0 (04/17/2010)  Problem # 4:  MITRAL VALVE DISORDER (ICD-424.0) F/U echo in 6 months murmur unchanged Her updated medication list for this problem includes:    Coreg 3.125 Mg Tabs (Carvedilol) .Marland Kitchen... Take 1 by mouth two times a day    Accupril 20 Mg Tabs (Quinapril hcl) .Marland Kitchen... 1 tab once daily    Lasix 40 Mg Tabs (Furosemide) .Marland Kitchen... Take 1 tablet by mouth once a day    Spironolactone 25 Mg Tabs (Spironolactone) .Marland Kitchen... 1 tab by mouth once daily  Patient Instructions: 1)  Your physician recommends that  you schedule a follow-up appointment in: 6 months with Dr [name] Prescriptions: WARFARIN SODIUM 5 MG TABS (WARFARIN SODIUM) Use as directed by Anticoagulation Clinic  #90 x 3   Entered by:   Deliah Goody, RN   Authorized by:   Colon Branch, MD, [facility]   Signed by:   Deliah Goody, RN on 06/29/2010   Method used:   Faxed to ...       [facility] (mail-order)             , [place]         Ph: [phone]       Fax: [phone]   RxID:   [id number]

## 2010-12-28 NOTE — Assessment & Plan Note (Signed)
Summary: eph/jml      Allergies Added:   Referring Provider:  na  Primary Provider:  Alroy Dust, MD    History of Present Illness: [name] is seen today post cath.  She has chronic afib on coumadin.  She had some SSCP with a myovue suggesting anterior ischemia.  Cath showed no signiificant disease.  She had a dilated aortic root and MR so Dr Teressa Lower ordered an echo on F/U.  I reviewed this today.  She has severe LAE from her chronic afib, moderate MR, PFO and AV sclerosis.  LV function is normal.  She is already on a diuretic with no signs of CHF.  He cath site has healed well.  She is back to work at [employer].  She stopped her coumadin for the cath with no difficulty.  She is 7 years post colectomy for CA and needs a F/U colonoscopy in the next couple weeks with Dr Marina Goodell.  I don't think she needs a lovenox bridge.  Current Problems (verified): 1)  Personal Hx Colon Cancer  (ICD-V10.05) 2)  Abnormal Cv (STRESS) Test  (ICD-794.39) 3)  Coumadin Therapy  (ICD-V58.61) 4)  Chest Pain Unspecified  (ICD-786.50) 5)  Hypertension  (ICD-401.9) 6)  Atrial Fibrillation  (ICD-427.31) 7)  Mitral Valve Disorder  (ICD-424.0) 8)  ? of Patent Foramen Ovale  (ICD-745.5) 9)  Peripheral Vascular Disease  (ICD-443.9) 10)  Venous Insufficiency  (ICD-459.81) 11)  Hyperlipidemia  (ICD-272.4) 12)  Impaired Glucose Tolerance  (ICD-271.3) 13)  Hypothyroidism  (ICD-244.9) 14)  Hx of Colon Cancer  (ICD-153.9) 15)  Hx of Splenic Infarction  (ICD-289.59) 16)  Uterine Cancer, Hx of  (ICD-V10.42) 17)  Hx of Urinary Tract Infection  (ICD-599.0) 18)  Osteoarthritis  (ICD-715.90) 19)  Low Back Pain, Chronic  (ICD-724.2) 20)  Osteoporosis  (ICD-733.00) 21)  Peripheral Neuropathy  (ICD-356.9) 22)  Anxiety  (ICD-300.00) 23)  Vitamin B12 Deficiency  (ICD-266.2)  Current Medications (verified): 1)  Warfarin Sodium 5 Mg Tabs (Warfarin Sodium) .... Use As Directed By Anticoagulation Clinic 2)  Coreg 3.125 Mg  Tabs  (Carvedilol) .... Take 1 By Mouth Two Times A Day 3)  Accupril 20 Mg  Tabs (Quinapril Hcl) .Marland Kitchen.. 1 Tab Once Daily 4)  Lasix 40 Mg  Tabs (Furosemide) .... Take 1 Tablet By Mouth Once A Day 5)  Spironolactone 25 Mg Tabs (Spironolactone) .Marland Kitchen.. 1 Tab By Mouth Once Daily 6)  Klor-Con 20 Meq  Pack (Potassium Chloride) .... Take 2 Tabs By Mouth Two Times A Day... 7)  Lipitor 20 Mg  Tabs (Atorvastatin Calcium) .... Take 1 By Mouth At Bedtime 8)  Synthroid 125 Mcg  Tabs (Levothyroxine Sodium) .... Take 1 By Mouth Once Daily 9)  Climara 0.1 Mg/24hr  Ptwk (Estradiol) .Marland Kitchen.. 1 Every Week 10)  Mobic 7.5 Mg Tabs (Meloxicam) .... Take 1 Tablet By Mouth Once A Day 11)  Caltrate 600+d 600-400 Mg-Unit  Tabs (Calcium Carbonate-Vitamin D) .... Take 1 By Mouth Once Daily 12)  Centrum Silver   Tabs (Multiple Vitamins-Minerals) .... Take 1 By Mouth Once Daily 13)  Vitamin B-12 1000 Mcg Tabs (Cyanocobalamin) .... Take 1 Tab Daily... 14)  Alprazolam 0.25 Mg  Tabs (Alprazolam) .... Take One Tablet Three Times A Day As Needed Fpr Anxiety. 15)  Meclizine Hcl 25 Mg  Tabs (Meclizine Hcl) .... Take 1/2 To 1 Tab By Mouth Every 6 H As Needed For Dizziness... 16)  Moviprep 100 Gm  Solr (Peg-Kcl-Nacl-Nasulf-Na Asc-C) .... As Per Prep Instructions.  Allergies (verified):  1)  ! Codeine  Past History:  Past Medical History: Last updated: 05/01/2010 HYPERTENSION (ICD-401.9) ATRIAL FIBRILLATION (ICD-427.31) MITRAL VALVE DISORDER (ICD-424.0) ? of PATENT FORAMEN OVALE (ICD-745.5) PERIPHERAL VASCULAR DISEASE (ICD-443.9) VENOUS INSUFFICIENCY (ICD-459.81) HYPERLIPIDEMIA (ICD-272.4) IMPAIRED GLUCOSE TOLERANCE (ICD-271.3) HYPOTHYROIDISM (ICD-244.9) Hx of COLON CANCER (ICD-153.9) Hx of SPLENIC INFARCTION (ICD-289.59) UTERINE CANCER, HX OF (ICD-V10.42) Hx of URINARY TRACT INFECTION (ICD-599.0) OSTEOARTHRITIS (ICD-715.90) LOW BACK PAIN, CHRONIC (ICD-724.2) OSTEOPOROSIS (ICD-733.00) PERIPHERAL NEUROPATHY (ICD-356.9) ANXIETY  (ICD-300.00) VITAMIN B12 DEFICIENCY (ICD-266.2) Colon Polyps Hemorrhoids  Past Surgical History: Last updated: 04/17/2010 S/P hysterectomy for uterine cancer S/P AP repair 5/07 by Latanya Presser S/P basal cell skin cancer Moh's surgery S/P right hemicolectomy for colon cancer - 1994 S/P lumbar laminectomy for spinal stenosis L3-4 in 2006 S/P left carpal tunnel surgery 4/08 by DrSypher  Family History: Last updated: 25-Jun-2008 mother deceased at age 35  from cancer father deceased at age 87  from heart failure 1 brother deceased age 55 cancer 1 sister deceased age 67 stroke and heart problems  Social History: Last updated: 05/02/2010 Retired---part time [employer] married no children social etoh  Review of Systems       Denies fever, malais, weight loss, blurry vision, decreased visual acuity, cough, sputum, SOB, hemoptysis, pleuritic pain, palpitaitons, heartburn, abdominal pain, melena, lower extremity edema, claudication, or rash.   Vital Signs:  Patient profile:   77 year old female Height:      71 inches Weight:      195 pounds BMI:     27.30 Pulse rate:   77 / minute Pulse rhythm:   irregularly irregular Resp:     12 per minute BP sitting:   147 / 85  (left arm)  Vitals Entered By: Kem Parkinson (May 10, 2010 3:46 PM)  Physical Exam  General:  Affect appropriate Healthy:  appears stated age HEENT: normal Neck supple with no adenopathy JVP normal no bruits no thyromegaly Lungs clear with no wheezing and good diaphragmatic motion Heart:  S1/S2 no murmur,rub, gallop or click PMI normal Abdomen: benighn, BS positve, no tenderness, no AAA no bruit.  No HSM or HJR Distal pulses intact with no bruits No edema Neuro non-focal Skin warm and dry    Impression & Recommendations:  Problem # 1:  CHEST PAIN UNSPECIFIED (ICD-786.50) No CAD by recent cath Her updated medication list for this problem includes:    Warfarin Sodium 5 Mg Tabs (Warfarin sodium) ..... Use as  directed by anticoagulation clinic    Coreg 3.125 Mg Tabs (Carvedilol) .Marland Kitchen... Take 1 by mouth two times a day    Accupril 20 Mg Tabs (Quinapril hcl) .Marland Kitchen... 1 tab once daily  Problem # 2:  COUMADIN THERAPY (ICD-V58.61) F/U clinic Ok to hold without lovenox bridge for colonoscopy  Problem # 3:  HYPERTENSION (ICD-401.9) Well controlled Her updated medication list for this problem includes:    Coreg 3.125 Mg Tabs (Carvedilol) .Marland Kitchen... Take 1 by mouth two times a day    Accupril 20 Mg Tabs (Quinapril hcl) .Marland Kitchen... 1 tab once daily    Lasix 40 Mg Tabs (Furosemide) .Marland Kitchen... Take 1 tablet by mouth once a day    Spironolactone 25 Mg Tabs (Spironolactone) .Marland Kitchen... 1 tab by mouth once daily  Problem # 4:  ATRIAL FIBRILLATION (ICD-427.31) Good rate control and anticoagulation.  Failed multiple attempts at [facility] and severe LAE by echo Her updated medication list for this problem includes:    Warfarin Sodium 5 Mg Tabs (Warfarin sodium) ..... Use as directed by anticoagulation  clinic    Coreg 3.125 Mg Tabs (Carvedilol) .Marland Kitchen... Take 1 by mouth two times a day  Problem # 5:  MITRAL VALVE DISORDER (ICD-424.0) Moderate MR by echo will follow but no evidence of CHF Her updated medication list for this problem includes:    Coreg 3.125 Mg Tabs (Carvedilol) .Marland Kitchen... Take 1 by mouth two times a day    Accupril 20 Mg Tabs (Quinapril hcl) .Marland Kitchen... 1 tab once daily    Lasix 40 Mg Tabs (Furosemide) .Marland Kitchen... Take 1 tablet by mouth once a day    Spironolactone 25 Mg Tabs (Spironolactone) .Marland Kitchen... 1 tab by mouth once daily  Patient Instructions: 1)  Your physician recommends that you schedule a follow-up appointment in: 6 MONTHS WITH DR [name] 2)  Your physician recommends that you continue on your current medications as directed. Please refer to the Current Medication list given to you today.

## 2010-12-28 NOTE — Medication Information (Signed)
Summary: rov/ewj  Anticoagulant Therapy  Managed by: Weston Brass, PharmD Referring MD: Charlton Haws MD Supervising MD: Jens Som MD, Arlys John Indication 1: Atrial Fibrillation (ICD-427.31) Indication 2: delete Lab Used: [facility] Site: [facility] INR POC 2.2 INR RANGE 2 - 3  Dietary changes: no    Health status changes: no    Bleeding/hemorrhagic complications: no    Recent/future hospitalizations: no    Any changes in medication regimen? no    Recent/future dental: no  Any missed doses?: no       Is patient compliant with meds? yes       Allergies: 1)  ! Codeine  Anticoagulation Management History:      The patient is taking warfarin and comes in today for a routine follow up visit.  Positive risk factors for bleeding include an age of 21 years or older.  The bleeding index is 'intermediate risk'.  Positive CHADS2 values include History of HTN and Age > 52 years old.  The start date was 04/01/1998.  Her last INR was 2.2 RATIO.  Anticoagulation responsible provider: Jens Som MD, Arlys John.  INR POC: 2.2.  Cuvette Lot#: [id number].  Exp: 04/2011.    Anticoagulation Management Assessment/Plan:      The patient's current anticoagulation dose is Warfarin sodium 5 mg tabs: Use as directed by Anticoagulation Clinic.  The target INR is 2 - 3.  The next INR is due 04/26/2010.  Anticoagulation instructions were given to patient.  Results were reviewed/authorized by Weston Brass, PharmD.  She was notified by Weston Brass PharmD.         Prior Anticoagulation Instructions: INR 1.7  Start taking 1 tablet daily except 1.5 tablets on Mondays, Wednesdays, and Fridays.  Recheck in 3 weeks.    Current Anticoagulation Instructions: INR 2.2  Continue same dose of 1 tablet every day except 1 1/2 tablets on Monday, Wednesday and Friday

## 2010-12-28 NOTE — Assessment & Plan Note (Signed)
Summary: F3M/DM  Medications Added ACCUPRIL 20 MG  TABS (QUINAPRIL HCL) 1 tab once daily        Visit Type:  3 mo f/u Primary Provider:  Alroy Dust  CC:  pt states she had some lightheadness yesterday thought it was vertigo but then took BP and it was elevated pt then took extra Accurapril and felt better.....  History of Present Illness: [name] is seen today for F/U of HTN , edema , afib and anticoagulation.  Her husband [name] is dying a slow death from probable pancreatic cancer.  We talked about this a lot.She has had occasional increases in her BP that an extra lisinopril helps with.    She denies palpitations, edema ,or dyspnea.  Her INR has been Rx   There have been on TIA or bleeding problems. She has had some SSCP which is new for her the last month.  It is not associated with food.  It is not exertional.  It is described as a burning sensation in the left side of her chest.  There is no previously documented CAD  Current Problems (verified): 1)  Hypertension  (ICD-401.9) 2)  Atrial Fibrillation  (ICD-427.31) 3)  Mitral Valve Disorder  (ICD-424.0) 4)  ? of Patent Foramen Ovale  (ICD-745.5) 5)  Peripheral Vascular Disease  (ICD-443.9) 6)  Venous Insufficiency  (ICD-459.81) 7)  Hyperlipidemia  (ICD-272.4) 8)  Impaired Glucose Tolerance  (ICD-271.3) 9)  Hypothyroidism  (ICD-244.9) 10)  Hx of Colon Cancer  (ICD-153.9) 11)  Hx of Splenic Infarction  (ICD-289.59) 12)  Uterine Cancer, Hx of  (ICD-V10.42) 13)  Hx of Urinary Tract Infection  (ICD-599.0) 14)  Osteoarthritis  (ICD-715.90) 15)  Low Back Pain, Chronic  (ICD-724.2) 16)  Osteoporosis  (ICD-733.00) 17)  Peripheral Neuropathy  (ICD-356.9) 18)  Anxiety  (ICD-300.00) 19)  Vitamin B12 Deficiency  (ICD-266.2)  Current Medications (verified): 1)  Coreg 3.125 Mg  Tabs (Carvedilol) .... Take 1 By Mouth Two Times A Day 2)  Accupril 20 Mg  Tabs (Quinapril Hcl) .Marland Kitchen.. 1 Tab Once Daily 3)  Lasix 40 Mg  Tabs (Furosemide) .... Take  1 Tablet By Mouth Once A Day 4)  Spironolactone 25 Mg Tabs (Spironolactone) .Marland Kitchen.. 1 Tab By Mouth Once Daily 5)  Klor-Con 20 Meq  Pack (Potassium Chloride) .... Take 2 Tabs By Mouth Two Times A Day... 6)  Lipitor 20 Mg  Tabs (Atorvastatin Calcium) .... Take 1 By Mouth At Bedtime 7)  Synthroid 125 Mcg  Tabs (Levothyroxine Sodium) .... Take 1 By Mouth Once Daily 8)  Climara 0.1 Mg/24hr  Ptwk (Estradiol) .Marland Kitchen.. 1 Every Week 9)  Caltrate 600+d 600-400 Mg-Unit  Tabs (Calcium Carbonate-Vitamin D) .... Take 1 By Mouth Once Daily 10)  Centrum Silver   Tabs (Multiple Vitamins-Minerals) .... Take 1 By Mouth Once Daily 11)  Vitamin B-12 1000 Mcg Tabs (Cyanocobalamin) .... Take 1 Tab Daily... 12)  Alprazolam 0.25 Mg  Tabs (Alprazolam) .... Take One Tablet Three Times A Day As Needed Fpr Anxiety. 13)  Meclizine Hcl 25 Mg  Tabs (Meclizine Hcl) .... Take 1/2 To 1 Tab By Mouth Every 6 H As Needed For Dizziness... 14)  Warfarin Sodium 5 Mg Tabs (Warfarin Sodium) .... Use As Directed By Anticoagulation Clinic  Allergies: 1)  ! Codeine  Past History:  Past Medical History: Last updated: 10/17/2009 HYPERTENSION (ICD-401.9) ATRIAL FIBRILLATION (ICD-427.31) MITRAL VALVE DISORDER (ICD-424.0) ? of PATENT FORAMEN OVALE (ICD-745.5) PERIPHERAL VASCULAR DISEASE (ICD-443.9) VENOUS INSUFFICIENCY (ICD-459.81) HYPERLIPIDEMIA (ICD-272.4) IMPAIRED GLUCOSE TOLERANCE (ICD-271.3)  HYPOTHYROIDISM (ICD-244.9) Hx of COLON CANCER (ICD-153.9) Hx of SPLENIC INFARCTION (ICD-289.59) UTERINE CANCER, HX OF (ICD-V10.42) Hx of URINARY TRACT INFECTION (ICD-599.0) OSTEOARTHRITIS (ICD-715.90) LOW BACK PAIN, CHRONIC (ICD-724.2) OSTEOPOROSIS (ICD-733.00) PERIPHERAL NEUROPATHY (ICD-356.9) ANXIETY (ICD-300.00) VITAMIN B12 DEFICIENCY (ICD-266.2)  Past Surgical History: Last updated: 10/17/2009 S/P hysterectomy for uterine cancer S/P AP repair 5/07 by Latanya Presser S/P basal cell skin cancer Moh's surgery S/P right hemicolectomy for colon  cancer - 1994 S/P lumbar laminectomy for spinal stenosis L3-4 in 2006 S/P left carpal tunnel surgery 4/08 by DrSypher  Family History: Last updated: July 20, 2008 mother deceased at age 45  from cancer father deceased at age 18  from heart failure 1 brother deceased age 52 cancer 1 sister deceased age 65 stroke and heart problems  Social History: Last updated: 2008-07-20 Retired married no children social etoh  Review of Systems       Denies fever, malais, weight loss, blurry vision, decreased visual acuity, cough, sputum, SOB, hemoptysis, pleuritic pain, palpitaitons, heartburn, abdominal pain, melena, lower extremity edema, claudication, or rash.   Vital Signs:  Patient profile:   77 year old female Height:      71 inches Weight:      194 pounds BMI:     27.16 Pulse rate:   68 / minute Pulse rhythm:   irregularly irregular BP sitting:   110 / 60  (left arm) Cuff size:   regular  Vitals Entered By: Danielle Rankin, CMA (Apr 17, 2010 8:46 AM)  Physical Exam  General:  Affect appropriate Healthy:  appears stated age HEENT: normal Neck supple with no adenopathy JVP normal no bruits no thyromegaly Lungs clear with no wheezing and good diaphragmatic motion Heart:  S1/S2 no murmur,rub, gallop or click PMI normal Abdomen: benighn, BS positve, no tenderness, no AAA no bruit.  No HSM or HJR Distal pulses intact with no bruits No edema Neuro non-focal Skin warm and dry    Impression & Recommendations:  Problem # 1:  HYPERTENSION (ICD-401.9) Fairly well controlled continue low sodium diet Her updated medication list for this problem includes:    Coreg 3.125 Mg Tabs (Carvedilol) .Marland Kitchen... Take 1 by mouth two times a day    Accupril 20 Mg Tabs (Quinapril hcl) .Marland Kitchen... 1 tab once daily    Lasix 40 Mg Tabs (Furosemide) .Marland Kitchen... Take 1 tablet by mouth once a day    Spironolactone 25 Mg Tabs (Spironolactone) .Marland Kitchen... 1 tab by mouth once daily  Orders: Nuclear Stress Test (Nuc Stress  Test)  Problem # 2:  ATRIAL FIBRILLATION (ICD-427.31) Good rate control no palpitations Her updated medication list for this problem includes:    Coreg 3.125 Mg Tabs (Carvedilol) .Marland Kitchen... Take 1 by mouth two times a day    Warfarin Sodium 5 Mg Tabs (Warfarin sodium) ..... Use as directed by anticoagulation clinic  Orders: Nuclear Stress Test (Nuc Stress Test)  Problem # 3:  HYPERLIPIDEMIA (ICD-272.4) Well controlled with no side effects Her updated medication list for this problem includes:    Lipitor 20 Mg Tabs (Atorvastatin calcium) .Marland Kitchen... Take 1 by mouth at bedtime  CHOL: 136 (04/18/2009)   LDL: 74 (04/18/2009)   HDL: 36.20 (04/18/2009)   TG: 127.0 (04/18/2009)  Problem # 4:  CHEST PAIN UNSPECIFIED (ICD-786.50) Atypical but needs Myovue Her updated medication list for this problem includes:    Coreg 3.125 Mg Tabs (Carvedilol) .Marland Kitchen... Take 1 by mouth two times a day    Accupril 20 Mg Tabs (Quinapril hcl) .Marland Kitchen... 1 tab once daily  Warfarin Sodium 5 Mg Tabs (Warfarin sodium) ..... Use as directed by anticoagulation clinic  Problem # 5:  COUMADIN THERAPY (ICD-V58.61) F/U coumadin clinic.  No bleeding problems  Patient Instructions: 1)  Your physician recommends that you schedule a follow-up appointment in: 3 months with Dr [name] 2)  Your physician has requested that you have an exercise stress myoview.  For further information please visit [email or web address]  Please follow instruction sheet, as given.

## 2010-12-28 NOTE — Assessment & Plan Note (Signed)
Summary: Cardiology Nuclear Study  Nuclear Med Background Indications for Stress Test: Evaluation for Ischemia   History: Echo, Myocardial Perfusion Study  History Comments: '92 Cath:OK per patient; '05 MPS:no ischemia, mild apical thinning, EF=79%; 2/10 Echo:EF=55%, mild MS, moderate MR, PFO; h/o afib  Symptoms: Chest Pain, Fatigue, Light-Headedness, Palpitations, Rapid HR  Symptoms Comments: Stress/Anxiety. Last episode of CP:1 week ago.   Nuclear Pre-Procedure Cardiac Risk Factors: Family History - CAD, Hypertension, Lipids, PVD Caffeine/Decaff Intake: None NPO After: 6:00 PM Lungs: Clear.  IV 0.9% NS with Angio Cath: 20g     IV Site: (R) AC IV Started by: Stanton Kidney EMT-P Chest Size (in) 38     Cup Size D     Height (in): 71.5 Weight (lb): 192 BMI: 26.50 Tech Comments: Coreg not held, taken at 6:00 am, per patient.  Nuclear Med Study 1 or 2 day study:  1 day     Stress Test Type:  Stress Reading MD:  Dietrich Pates, MD     Referring MD:  Charlton Haws, MD Resting Radionuclide:  Technetium 68m Tetrofosmin     Resting Radionuclide Dose:  11 mCi  Stress Radionuclide:  Technetium 48m Tetrofosmin     Stress Radionuclide Dose:  33 mCi   Stress Protocol Exercise Time (min):  4:15 min     Max HR:  157 bpm     Predicted Max HR:  141 bpm  Max Systolic BP: 143 mm Hg     Percent Max HR:  111.35 %     METS: 4.8 Rate Pressure Product:  16109    Stress Test Technologist:  Rea College CMA-N     Nuclear Technologist:  Domenic Polite CNMT  Rest Procedure  Myocardial perfusion imaging was performed at rest 45 minutes following the intravenous administration of Myoview Technetium 27m Tetrofosmin.  Stress Procedure  The patient exercised for 4:15.  The patient stopped due to moderate dyspnea, with an O2 Sat of 96%.  She denied any chest pain.  There were nonspecific ST-T wave changes, occasional PVC's and a 3-beat run of V-tach.  Myoview was injected at peak exercise and myocardial  perfusion imaging was performed after a brief delay.  QPS Raw Data Images:  Soft tissue (diaphragm, bowel activit, breast tissue) surround heart. Stress Images:  Thinning with decreased counts in the mdi /distal anterior, anterolateral  wall and apex. Rest Images:  Normalization in perfusion. Subtraction (SDS):  These findings are consistent with ischemia. Transient Ischemic Dilatation:  .91  (Normal <1.22)  Lung/Heart Ratio:  .38  (Normal <0.45)  Quantitative Gated Spect Images QGS EDV:  75 ml QGS ESV:  17 ml QGS EF:  77 %   Overall Impression  Exercise Capacity: Fair exercise capacity. BP Response: Normal blood pressure response. Clinical Symptoms: No chest pain ECG Impression: ST depression in the inferior leads(1 mm downsloping) in stage 1 extending to the lateral leads.  Not completely resolved by the end of recovery Overall Impression Comments: Electrically positive at low workload.  Myoview with Ischemia in the anterior, anterolateral and apical distribution.  Cannot completely exclude some shifting of breast tissue.

## 2010-12-28 NOTE — Medication Information (Signed)
Summary: rov/sp  Anticoagulant Therapy  Managed by: Eda Keys, PharmD Referring MD: Charlton Haws MD Supervising MD: Myrtis Ser MD, Tinnie Gens Indication 1: Atrial Fibrillation (ICD-427.31) Indication 2: delete Lab Used: [facility] Site: [facility] INR POC 2.2 INR RANGE 2 - 3  Dietary changes: no    Health status changes: no    Bleeding/hemorrhagic complications: no    Recent/future hospitalizations: no    Any changes in medication regimen? no    Recent/future dental: no  Any missed doses?: no       Is patient compliant with meds? yes      Comments: Pt to have colonoscopy scheduled at next GI visit, I have asked her to notify us when this is scheduled.  Allergies: 1)  ! Codeine  Anticoagulation Management History:      The patient is taking warfarin and comes in today for a routine follow up visit.  Positive risk factors for bleeding include an age of 75 years or older.  The bleeding index is 'intermediate risk'.  Positive CHADS2 values include History of HTN and Age > 42 years old.  The start date was 04/01/1998.  Her last INR was 2.2 RATIO.  Anticoagulation responsible provider: Myrtis Ser MD, Tinnie Gens.  INR POC: 2.2.  Cuvette Lot#: [id number].  Exp: 06/2011.    Anticoagulation Management Assessment/Plan:      The patient's current anticoagulation dose is Warfarin sodium 5 mg tabs: Use as directed by Anticoagulation Clinic.  The target INR is 2 - 3.  The next INR is due 05/15/2010.  Anticoagulation instructions were given to patient.  Results were reviewed/authorized by Eda Keys, PharmD.  She was notified by Eda Keys.         Prior Anticoagulation Instructions: INR 2.2  Continue same dose of 1 tablet every day except 1 1/2 tablets on Monday, Wednesday and Friday   Current Anticoagulation Instructions: INR 2.2  Continue current dosing schedule.  Return to clinic in 4 weeks.

## 2010-12-28 NOTE — Medication Information (Signed)
Summary: rov/tm  Anticoagulant Therapy  Managed by: Cloyde Reams, RN, BSN Referring MD: Charlton Haws, MD PCP: Alroy Dust, MD  Supervising MD: Gala Romney MD, Reuel Boom Indication 1: Atrial Fibrillation (ICD-427.31) Indication 2: delete Lab Used: [facility] Site: [facility] INR POC 2.0 INR RANGE 2 - 3  Dietary changes: no    Health status changes: no    Bleeding/hemorrhagic complications: no    Recent/future hospitalizations: no    Any changes in medication regimen? no    Recent/future dental: no  Any missed doses?: no       Is patient compliant with meds? yes       Allergies: 1)  ! Codeine  Anticoagulation Management History:      The patient is taking warfarin and comes in today for a routine follow up visit.  Positive risk factors for bleeding include an age of 75 years or older.  The bleeding index is 'intermediate risk'.  Positive CHADS2 values include History of HTN and Age > 21 years old.  The start date was 04/01/1998.  Her last INR was 2.2 RATIO.  Anticoagulation responsible provider: Bensimhon MD, Reuel Boom.  INR POC: 2.0.  Cuvette Lot#: [id number].  Exp: 09/2011.    Anticoagulation Management Assessment/Plan:      The patient's current anticoagulation dose is Warfarin sodium 5 mg tabs: Use as directed by Anticoagulation Clinic.  The target INR is 2 - 3.  The next INR is due 10/12/2010.  Anticoagulation instructions were given to patient.  Results were reviewed/authorized by Cloyde Reams, RN, BSN.  She was notified by Cloyde Reams RN.         Prior Anticoagulation Instructions: INR 2.8 Continue 5mg s daily except 7.5mg s on Mondays, Wednesdays and Fridays. Recheck in 4 weeks.   Current Anticoagulation Instructions: INR 2.0  Continue on same dosage 1 tablet daily except 1.5 tablets on Mondays, Wednesdays, and Fridays.  Recheck in 4 weeks.

## 2010-12-28 NOTE — Procedures (Signed)
Summary: Colonoscopy/[facility]  Colonoscopy/[facility]   Imported By: Sherian Rein 05/04/2010 07:45:00  _____________________________________________________________________  External Attachment:    Type:   Image     Comment:   External Document

## 2010-12-28 NOTE — Medication Information (Signed)
Summary: ccr/gd  Anticoagulant Therapy  Managed by: Cloyde Reams, RN, BSN Referring MD: Charlton Haws MD Supervising MD: [name] MD, Theron Arista Indication 1: Atrial Fibrillation (ICD-427.31) Indication 2: See every 2 months per Nishan.. (ICD-111) Lab Used: [facility] Site: [facility] INR POC 1.7 INR RANGE 2 - 3  Dietary changes: no    Health status changes: yes       Details: Feel like may be taking a head cold.    Bleeding/hemorrhagic complications: no    Recent/future hospitalizations: no    Any changes in medication regimen? yes       Details: Started taking 2 tablets of accupril daily on 12/12/09 d/t elevated BP.    Recent/future dental: no  Any missed doses?: no       Is patient compliant with meds? yes       Allergies (verified): 1)  ! Codeine  Anticoagulation Management History:      The patient is taking warfarin and comes in today for a routine follow up visit.  Positive risk factors for bleeding include an age of 75 years or older.  The bleeding index is 'intermediate risk'.  Positive CHADS2 values include History of HTN and Age > 75 years old.  The start date was 04/01/1998.  Her last INR was 2.2 RATIO.  Anticoagulation responsible provider: [name] MD, Theron Arista.  INR POC: 1.7.  Exp: 12/2010.    Anticoagulation Management Assessment/Plan:      The patient's current anticoagulation dose is Warfarin sodium 5 mg tabs: Use as directed by Anticoagulation Clinic.  The target INR is 2 - 3.  The next INR is due 12/26/2009.  Anticoagulation instructions were given to patient.  Results were reviewed/authorized by Cloyde Reams, RN, BSN.  She was notified by Cloyde Reams RN.         Prior Anticoagulation Instructions: INR 2.3  Continue 1 tab daily except 1.5 tabs on Sundays.   Recheck in 4 weeks.    Current Anticoagulation Instructions: INR 1.7  Take 1.5 tablets todaythen resume same dosage 1 tablet daily except 1.5 tablets on Sundays.

## 2010-12-28 NOTE — Letter (Signed)
Summary: Cardiac Catheterization Instructions- [facility]  [facility], Main Office  [address]   [place], [place] [address]   Phone: [phone]  Fax: [phone]     04/20/2010 MRN: [id number]  [name] [address] [place], [place]  [address]  Dear [name],   You are scheduled for a Cardiac Catheterization on Tuesday 04/25/10 with Dr.Brodie  Please arrive to the 1st floor of the Heart and Vascular Center at [facility] at 9:30 am / pm on the day of your procedure. Please do not arrive before 6:30 a.m. Call the Heart and Vascular Center at [phone] if you are unable to make your appointmnet. The Code to get into the parking garage under the building is 0010. Take the elevators to the 1st floor. You must have someone to drive you home. Someone must be with you for the first 24 hours after you arrive home. Please wear clothes that are easy to get on and off and wear slip-on shoes. Do not eat or drink after midnight except water with your medications that morning. Bring all your medications and current insurance cards with you.  _X__ DO NOT take these medications before your procedure:   Last dose of Coumadin Friday 04/21/10   AM of 5/31 hold your Furosemide and Spironolactone  ___ Make sure you take your aspirin.  ___ You may take ALL of your medications with water that morning. ________________________________________________________________________________________________________________________________  ___ DO NOT take ANY medications before your procedure.  ___ Pre-med instructions:  ________________________________________________________________________________________________________________________________  The usual length of stay after your procedure is 2 to 3 hours. This can vary.  If you have any questions, please call the office at the number listed above.   Meredith Staggers, RN

## 2010-12-28 NOTE — Medication Information (Signed)
Summary: rov/tm  Anticoagulant Therapy  Managed by: Weston Brass, PharmD Referring MD: Charlton Haws, MD PCP: Alroy Dust, MD  Supervising MD: Ladona Ridgel MD, Sharlot Gowda Indication 1: Atrial Fibrillation (ICD-427.31) Indication 2: delete Lab Used: [facility] Site: [facility] INR POC 1.8 INR RANGE 2 - 3  Dietary changes: no    Health status changes: no    Bleeding/hemorrhagic complications: no    Recent/future hospitalizations: no    Any changes in medication regimen? no    Recent/future dental: no  Any missed doses?: yes     Details: off x 4 days for colonoscopy.  Restarted 1 week ago  Is patient compliant with meds? yes       Allergies: 1)  ! Codeine  Anticoagulation Management History:      The patient is taking warfarin and comes in today for a routine follow up visit.  Positive risk factors for bleeding include an age of 18 years or older.  The bleeding index is 'intermediate risk'.  Positive CHADS2 values include History of HTN and Age > 26 years old.  The start date was 04/01/1998.  Her last INR was 2.2 RATIO.  Anticoagulation responsible provider: Ladona Ridgel MD, Sharlot Gowda.  INR POC: 1.8.  Cuvette Lot#: [id number].  Exp: 07/2011.    Anticoagulation Management Assessment/Plan:      The patient's current anticoagulation dose is Warfarin sodium 5 mg tabs: Use as directed by Anticoagulation Clinic.  The target INR is 2 - 3.  The next INR is due 06/22/2010.  Anticoagulation instructions were given to patient.  Results were reviewed/authorized by Weston Brass, PharmD.  She was notified by Weston Brass PharmD.         Prior Anticoagulation Instructions: INR 2.3  Continue same dose of 1 tablet every day except 1 1/2 tablets on Monday, Wednesday and Friday.  Take last dose of Coumadin on 6/25.  Colonoscopy on 6/30.  Restart Coumadin when okay with MD.  Restart with 2 tablets daily for 2 days then resume normal dose.    Current Anticoagulation Instructions: INR 1.8  Take 1 1/2 tablets today then  resume same dose of 1 tablet every day except 1 1/2 tablets on Monday, Wednesday and Friday.

## 2010-12-28 NOTE — Procedures (Signed)
Summary: Colonoscopy  Patient: [name] Note: All result statuses are Final unless otherwise noted.  Tests: (1) Colonoscopy (COL)   COL Colonoscopy           DONE     [facility]     [address]     [place], [place]  [address]           COLONOSCOPY PROCEDURE REPORT           PATIENT:  [name], [name]  MR#:  [id number]     BIRTHDATE:  March 07, 1929, 81 yrs. old  GENDER:  female     ENDOSCOPIST:  Wilhemina Bonito. Eda Keys, MD     REF. BY:  Surveillance Program Recall,     PROCEDURE DATE:  05/25/2010     PROCEDURE:  Colonoscopy with snare polypectomy x 2     ASA CLASS:  Class II     INDICATIONS:  history of colon cancer, history of pre-cancerous     (adenomatous) colon polyps ; index cancer 1994; last exam 2006     MEDICATIONS:   Fentanyl 50 mcg IV, Versed 6 mg IV           DESCRIPTION OF PROCEDURE:   After the risks benefits and     alternatives of the procedure were thoroughly explained, informed     consent was obtained.  Digital rectal exam was performed and     revealed no abnormalities.   The LB [id number] endoscope     was introduced through the anus and advanced to the anastomosis,     without limitations. T=5:32 min. The quality of the prep was     excellent, using MoviPrep.  The instrument was then slowly     withdrawn (T=13:08 min) as the colon was fully examined.     <<PROCEDUREIMAGES>>           FINDINGS:  The right colon was surgically resected and an     ileo-colonic anastamosis was seen.  An 8mm - 10mm pedunculated     polyp was found in the mid transverse colon. Polyp was snared     without cautery. Retrieval was successful. A diminutive polyp was     found in the rectum. Polyp was snared without cautery. Retrieval     was unsuccessful (too small).  Moderate diverticulosis was found     in the sigmoid colon.   Retroflexed views in the rectum revealed     no abnormalities.    The scope was then withdrawn from the patient     and the procedure  completed.           COMPLICATIONS:  None     ENDOSCOPIC IMPRESSION:     1) Prior right hemi-colectomy     2) Pedunculated polyp in the mid transverse colon -removed     3) Diminutive polyp in the rectum - removed     4) Moderate diverticulosis in the sigmoid colon           RECOMMENDATIONS:     1) Consider Follow up colonoscopy in 5 years     2) Resume Coumadin (warfarin) today           ______________________________     Wilhemina Bonito. Eda Keys, MD           CC:  Michele Mcalpine, MD; The Patient           n.     eSIGNED:   Wilhemina Bonito. Eda Keys  at 05/25/2010 04:57 PM           Darlyne Russian, [phone]  Note: An exclamation mark (!) indicates a result that was not dispersed into the flowsheet. Document Creation Date: 05/25/2010 4:58 PM _______________________________________________________________________  (1) Order result status: Final Collection or observation date-time: 05/25/2010 16:48 Requested date-time:  Receipt date-time:  Reported date-time:  Referring Physician:   Ordering Physician: Fransico Setters [phone]) Specimen Source:  Source: Launa Grill Order Number: [id number] Lab site:   Appended Document: Colonoscopy recall 5 yrs/04-2015/aw     Procedures Next Due Date:    Colonoscopy: 04/2015

## 2010-12-28 NOTE — Letter (Signed)
Summary: Colonoscopy-Changed to Office Visit Letter  [facility]  [address] [place], [place] [address]   Phone: [phone]  Fax: [phone]      January 27, 2010 MRN: [id number]   [name] [address] [place], [place]  [address]   Dear [name],   According to our records, it is time for you to schedule a Colonoscopy. However, after reviewing your medical record, I feel that an office visit would be most appropriate to more completely evaluate you and determine your need for a repeat procedure.  Please call [phone] (option #2) at your convenience to schedule an office visit. If you have any questions, concerns, or feel that this letter is in error, we would appreciate your call.   Sincerely,  Wilhemina Bonito. Marina Goodell, M.D.  [facility] [phone]

## 2010-12-28 NOTE — Miscellaneous (Signed)
Summary: Social Worker Report  Social Worker Report   Imported By: Elenor Legato 07/20/2010 13:17:51  _____________________________________________________________________  External Attachment:    Type:   Image     Comment:   External Document

## 2010-12-29 ENCOUNTER — Encounter: Payer: Self-pay | Admitting: Internal Medicine

## 2010-12-29 ENCOUNTER — Encounter (INDEPENDENT_AMBULATORY_CARE_PROVIDER_SITE_OTHER): Payer: Medicare Other

## 2010-12-29 ENCOUNTER — Ambulatory Visit: Admit: 2010-12-29 | Payer: Self-pay

## 2010-12-29 DIAGNOSIS — Z7901 Long term (current) use of anticoagulants: Secondary | ICD-10-CM

## 2010-12-29 DIAGNOSIS — I4891 Unspecified atrial fibrillation: Secondary | ICD-10-CM

## 2010-12-29 LAB — CONVERTED CEMR LAB: POC INR: 2

## 2011-01-03 NOTE — Medication Information (Signed)
Summary: rov/ewj  Anticoagulant Therapy  Managed by: Weston Brass, PharmD Referring MD: Charlton Haws, MD PCP: Alroy Dust, MD  Supervising MD: Tenny Craw MD, Gunnar Fusi Indication 1: Atrial Fibrillation (ICD-427.31) Indication 2: delete Lab Used: [facility] Site: [facility] INR POC 2.0 INR RANGE 2 - 3  Dietary changes: no    Health status changes: no    Bleeding/hemorrhagic complications: no    Recent/future hospitalizations: no    Any changes in medication regimen? no    Recent/future dental: no  Any missed doses?: no       Is patient compliant with meds? yes       Allergies: 1)  ! Codeine  Anticoagulation Management History:      The patient is taking warfarin and comes in today for a routine follow up visit.  Positive risk factors for bleeding include an age of 75 years or older.  The bleeding index is 'intermediate risk'.  Positive CHADS2 values include History of HTN and Age > 30 years old.  The start date was 04/01/1998.  Her last INR was 2.2 RATIO.  Anticoagulation responsible provider: Tenny Craw MD, Gunnar Fusi.  INR POC: 2.0.  Cuvette Lot#: [id number].  Exp: 11/2011.    Anticoagulation Management Assessment/Plan:      The patient's current anticoagulation dose is Warfarin sodium 5 mg tabs: Use as directed by Anticoagulation Clinic.  The target INR is 2 - 3.  The next INR is due 01/26/2011.  Anticoagulation instructions were given to patient.  Results were reviewed/authorized by Weston Brass, PharmD.  She was notified by Margot Chimes PharmD Candidate.         Prior Anticoagulation Instructions: INR 2.2  Coumadin 5 mg tablets - Take 1 tablet on Sundays, Tuesdays, Thursdays, and Saturdays and 1.5 tablets on Mondays, Wednesdays, and Fridays.   Current Anticoagulation Instructions: INR 2.0  Continue current coumadin dose of 1 tablet everyday except 1 1/2 tablets on Monday, Wednesday, and Friday.

## 2011-01-09 DIAGNOSIS — Z7901 Long term (current) use of anticoagulants: Secondary | ICD-10-CM

## 2011-01-09 DIAGNOSIS — I4891 Unspecified atrial fibrillation: Secondary | ICD-10-CM

## 2011-01-16 ENCOUNTER — Other Ambulatory Visit: Payer: Self-pay | Admitting: Dermatology

## 2011-01-23 ENCOUNTER — Encounter: Payer: Self-pay | Admitting: Cardiovascular Disease

## 2011-01-24 ENCOUNTER — Encounter (INDEPENDENT_AMBULATORY_CARE_PROVIDER_SITE_OTHER): Payer: Medicare Other

## 2011-01-24 ENCOUNTER — Encounter: Payer: Self-pay | Admitting: Cardiovascular Disease

## 2011-01-24 DIAGNOSIS — I739 Peripheral vascular disease, unspecified: Secondary | ICD-10-CM

## 2011-01-24 DIAGNOSIS — I4891 Unspecified atrial fibrillation: Secondary | ICD-10-CM

## 2011-01-24 DIAGNOSIS — Z7901 Long term (current) use of anticoagulants: Secondary | ICD-10-CM

## 2011-01-24 LAB — CONVERTED CEMR LAB: POC INR: 1.7

## 2011-01-25 ENCOUNTER — Telehealth (INDEPENDENT_AMBULATORY_CARE_PROVIDER_SITE_OTHER): Payer: Self-pay | Admitting: *Deleted

## 2011-02-01 NOTE — Medication Information (Signed)
Summary: rov/sp  Anticoagulant Therapy  Managed by: Windell Hummingbird, RN Referring MD: Charlton Haws, MD PCP: Alroy Dust, MD  Supervising MD: Excell Seltzer MD, Casimiro Needle Indication 1: Atrial Fibrillation (ICD-427.31) Indication 2: delete Lab Used: [facility] Site: [facility] INR POC 1.7 INR RANGE 2 - 3  Dietary changes: no    Health status changes: no    Bleeding/hemorrhagic complications: no    Recent/future hospitalizations: no    Any changes in medication regimen? no    Recent/future dental: no  Any missed doses?: no       Is patient compliant with meds? yes       Allergies: 1)  ! Codeine  Anticoagulation Management History:      The patient is taking warfarin and comes in today for a routine follow up visit.  Positive risk factors for bleeding include an age of 75 years or older.  The bleeding index is 'intermediate risk'.  Positive CHADS2 values include History of HTN and Age > 49 years old.  The start date was 04/01/1998.  Her last INR was 2.2 RATIO.  Anticoagulation responsible provider: Excell Seltzer MD, Casimiro Needle.  INR POC: 1.7.  Cuvette Lot#: [id number].  Exp: 11/2011.    Anticoagulation Management Assessment/Plan:      The patient's current anticoagulation dose is Warfarin sodium 5 mg tabs: Use as directed by Anticoagulation Clinic.  The target INR is 2 - 3.  The next INR is due 02/07/2011.  Anticoagulation instructions were given to patient.  Results were reviewed/authorized by Windell Hummingbird, RN.  She was notified by Windell Hummingbird, RN.         Prior Anticoagulation Instructions: INR 2.0  Continue current coumadin dose of 1 tablet everyday except 1 1/2 tablets on Monday, Wednesday, and Friday.  Current Anticoagulation Instructions: INR 1.7 Take 2 tablets today.  Then, begin taking  1 1/2 tablets every day, except take 1 tablet on Sundays, Tuesdays, and Thursdays. Recheck in 2 weeks.

## 2011-02-01 NOTE — Miscellaneous (Signed)
Summary: Orders Update  Clinical Lists Changes  Orders: Added new Test order of Arterial Duplex Lower Extremity (Arterial Duplex Low) - Signed 

## 2011-02-01 NOTE — Progress Notes (Signed)
Summary: Records Request   Faxed Preliminary Doppler to [facility] ([phone]). Debby Freiberg  January 25, 2011 4:03 PM

## 2011-02-08 ENCOUNTER — Encounter: Payer: Self-pay | Admitting: Internal Medicine

## 2011-02-08 ENCOUNTER — Encounter (INDEPENDENT_AMBULATORY_CARE_PROVIDER_SITE_OTHER): Payer: Medicare Other

## 2011-02-08 DIAGNOSIS — I4891 Unspecified atrial fibrillation: Secondary | ICD-10-CM

## 2011-02-08 DIAGNOSIS — Z7901 Long term (current) use of anticoagulants: Secondary | ICD-10-CM

## 2011-02-08 LAB — DIFFERENTIAL
Eosinophils Relative: 2 % (ref 0–5)
Lymphocytes Relative: 18 % (ref 12–46)
Lymphs Abs: 1.8 10*3/uL (ref 0.7–4.0)
Neutro Abs: 7.2 10*3/uL (ref 1.7–7.7)

## 2011-02-08 LAB — CBC
Hemoglobin: 14.1 g/dL (ref 12.0–15.0)
MCH: 31.5 pg (ref 26.0–34.0)
MCHC: 32.8 g/dL (ref 30.0–36.0)
Platelets: 244 10*3/uL (ref 150–400)
RDW: 12.9 % (ref 11.5–15.5)

## 2011-02-08 LAB — PROTIME-INR
INR: 2.34 — ABNORMAL HIGH (ref 0.00–1.49)
Prothrombin Time: 25.8 seconds — ABNORMAL HIGH (ref 11.6–15.2)

## 2011-02-08 LAB — POCT I-STAT, CHEM 8
Chloride: 108 mEq/L (ref 96–112)
HCT: 43 % (ref 36.0–46.0)
Potassium: 4.8 mEq/L (ref 3.5–5.1)
Sodium: 139 mEq/L (ref 135–145)

## 2011-02-08 LAB — CONVERTED CEMR LAB: POC INR: 3.1

## 2011-02-13 NOTE — Medication Information (Signed)
Summary: rov/pc pt rs appt/mt  Anticoagulant Therapy  Managed by: Windell Hummingbird, RN Referring MD: Charlton Haws, MD PCP: Alroy Dust, MD  Supervising MD: Johney Frame MD, Fayrene Fearing Indication 1: Atrial Fibrillation (ICD-427.31) Indication 2: delete Lab Used: [facility] Site: [address] INR POC 3.1 INR RANGE 2 - 3  Dietary changes: no    Health status changes: no    Bleeding/hemorrhagic complications: no    Recent/future hospitalizations: no    Any changes in medication regimen? no    Recent/future dental: no  Any missed doses?: no       Is patient compliant with meds? yes       Allergies: 1)  ! Codeine  Anticoagulation Management History:      The patient is taking warfarin and comes in today for a routine follow up visit.  Positive risk factors for bleeding include an age of 75 years or older.  The bleeding index is 'intermediate risk'.  Positive CHADS2 values include History of HTN and Age > 80 years old.  The start date was 04/01/1998.  Her last INR was 2.2 RATIO.  Anticoagulation responsible provider: Ellie Bryand MD, Fayrene Fearing.  INR POC: 3.1.  Cuvette Lot#: [id number].  Exp: 11/2011.    Anticoagulation Management Assessment/Plan:      The patient's current anticoagulation dose is Warfarin sodium 5 mg tabs: Use as directed by Anticoagulation Clinic.  The target INR is 2 - 3.  The next INR is due 02/22/2011.  Anticoagulation instructions were given to patient.  Results were reviewed/authorized by Windell Hummingbird, RN.  She was notified by Windell Hummingbird, RN.         Prior Anticoagulation Instructions: INR 1.7 Take 2 tablets today.  Then, begin taking  1 1/2 tablets every day, except take 1 tablet on Sundays, Tuesdays, and Thursdays. Recheck in 2 weeks.   Current Anticoagulation Instructions: INR 3.1 Take only 1/2 tablet today.  Then resume taking 1 1/2 tablets daily, except take 1 tablet on Sundays, Tuesdays, and Thursdays. Recheck in 2 weeks.

## 2011-02-22 ENCOUNTER — Ambulatory Visit (INDEPENDENT_AMBULATORY_CARE_PROVIDER_SITE_OTHER): Payer: Medicare Other | Admitting: *Deleted

## 2011-02-22 DIAGNOSIS — I4891 Unspecified atrial fibrillation: Secondary | ICD-10-CM

## 2011-02-22 DIAGNOSIS — Z7901 Long term (current) use of anticoagulants: Secondary | ICD-10-CM

## 2011-02-22 LAB — POCT INR: INR: 2.7

## 2011-02-22 NOTE — Patient Instructions (Signed)
Cont taking current regimen Return to clinic in 4 weeks

## 2011-03-01 ENCOUNTER — Telehealth: Payer: Self-pay | Admitting: Cardiovascular Disease

## 2011-03-01 NOTE — Telephone Encounter (Signed)
Faxed All Cardiac to [facility] @ [phone] 03/01/11/KM

## 2011-03-06 ENCOUNTER — Ambulatory Visit (INDEPENDENT_AMBULATORY_CARE_PROVIDER_SITE_OTHER): Payer: Medicare Other | Admitting: *Deleted

## 2011-03-06 DIAGNOSIS — I4891 Unspecified atrial fibrillation: Secondary | ICD-10-CM

## 2011-03-06 DIAGNOSIS — Z7901 Long term (current) use of anticoagulants: Secondary | ICD-10-CM

## 2011-03-06 LAB — POCT INR: INR: 1.1

## 2011-03-06 NOTE — Patient Instructions (Signed)
INR 1.1 Patient is holding coumadin for foot surgery to be done tomorrow (03/07/2011).  She will resume taking coumadin on Thursday 03/08/11 by taking 2 tablets (10mg ).  Then begin pre-surgery regimen of 1 1/2 tablets (7.5mg ) daily, except take 1 tablet (5mg ) on Sundays, Tuesdays, and Thursdays. Recheck coumadin on 03/15/2011.

## 2011-03-15 ENCOUNTER — Ambulatory Visit (INDEPENDENT_AMBULATORY_CARE_PROVIDER_SITE_OTHER): Payer: Medicare Other | Admitting: *Deleted

## 2011-03-15 DIAGNOSIS — Z7901 Long term (current) use of anticoagulants: Secondary | ICD-10-CM

## 2011-03-15 DIAGNOSIS — I4891 Unspecified atrial fibrillation: Secondary | ICD-10-CM

## 2011-03-15 LAB — POCT INR: INR: 1.5

## 2011-03-22 ENCOUNTER — Ambulatory Visit (INDEPENDENT_AMBULATORY_CARE_PROVIDER_SITE_OTHER): Payer: Medicare Other | Admitting: *Deleted

## 2011-03-22 DIAGNOSIS — Z7901 Long term (current) use of anticoagulants: Secondary | ICD-10-CM

## 2011-03-22 DIAGNOSIS — I4891 Unspecified atrial fibrillation: Secondary | ICD-10-CM

## 2011-03-26 ENCOUNTER — Telehealth: Payer: Self-pay | Admitting: Cardiovascular Disease

## 2011-03-26 MED ORDER — CARVEDILOL 3.125 MG PO TABS: 3.1250 mg | ORAL_TABLET | Freq: Two times a day (BID) | ORAL | Status: DC

## 2011-03-26 NOTE — Telephone Encounter (Signed)
Pt requesting refill of coreg at [facility]

## 2011-04-06 ENCOUNTER — Telehealth: Payer: Self-pay | Admitting: Cardiovascular Disease

## 2011-04-06 MED ORDER — ATORVASTATIN CALCIUM 20 MG PO TABS: 20.0000 mg | ORAL_TABLET | Freq: Every day | ORAL | Status: DC

## 2011-04-06 NOTE — Telephone Encounter (Signed)
Needs script sent to [facility]. Lipitor

## 2011-04-10 NOTE — Assessment & Plan Note (Signed)
[facility]                            CARDIOLOGY OFFICE NOTE   [name], [name]                       MRN:          [id number]  DATE:01/30/2008                            DOB:          03/07/1929    [name] returns today for follow-up.  She has chronic atrial  fibrillation with hypertension, hypercholesterolemia.   She has been doing well.  She denies any significant palpitations, PND,  orthopnea.  Her last INR was 2.6.  She is currently on azithromycin for  a sinus infection.  She has seen [name] and she Chloe get her INR  checked in 2 weeks.  There is not a particular interaction with Coumadin  here.  Review of systems other than her sinus infection is negative.  She has not had any significant fevers.  No chest pain, PND, orthopnea,  mild chronic lower extremity edema.   She had been compliant with her meds.  She is on Climara 0.1 mg day,  Synthroid 125 mcg per day, Protegra, Coumadin as directed, Lipitor 20 a  day, Caltrate, Centrum, Accupril 20 mg a day, Coreg 3.25 b.i.d.,  azithromycin 250 a day for three more days   PHYSICAL EXAMINATION:  Exam is remarkable for a elderly, stately  woman  in no distress.  Affect is appropriate.  Weight is 199, blood pressure  is 130/80, pulse 87 and regular, respiratory rate 14, afebrile.  HEENT:  Unremarkable.  Carotids normal, without bruit, no lymphadenopathy,  thyromegaly JVP elevation.  LUNGS:  Clear, good diaphragmatic motion.  No wheezing.  CARDIOVASCULAR:  There is an S1-S2, normal heart sounds, PMI normal.  ABDOMEN:  Benign, bowel sounds positive.  She is status post colon  surgery.  No AAA, no tenderness, no bruit, no hepatosplenomegaly.  EXTREMITIES:  Digital pulses intact, no edema.  NEURO:  Nonfocal.  SKIN:  Warm and dry.  MUSCULOSKELETAL:  She has significant arthritic changes and some  swelling in her left hand.   IMPRESSION:  1. Atrial fibrillation,  good rate control and  anticoagulation.      Follow-up in Coumadin clinic in  2 weeks.  2. Hypertension currently well controlled.  Continue current dose of      Accupril, low-salt diet.  3. Hyperlipidemia.  Lipid and liver profile in 6 months.  Continue      Lipitor.  4. Hypothyroidism.  Continue Synthroid, check TSH in 6 months  5. Left hand arthritis referral to [name].  She may be a      candidate for methotrexate.  6. Sinusitis.  Continue azithromycin, follow-up primary care MD.     [name]. Chloe Emms, MD, [facility]  Electronically Signed   PCN/MedQ  DD: 01/30/2008  DT: 02/01/2008  Job #: [id number]

## 2011-04-10 NOTE — Assessment & Plan Note (Signed)
[facility]                                 ON-CALL NOTE   [name], [name]                         MRN:          [id number]  DATE:04/24/2007                            DOB:          March 21, 1929    TIME OF CALL:  8pm.   I was contacted initially by Ms. Vani Riga's husband. He was  concerned that she was having fever. The story is that she has had fever  and chills for several days and went to the emergency room today with  pyuria noted. She was given antibiotics and has already taken her first  dose of Cipro and is now spiking a fever of 102 with chills. However,  she has no shortness of breath and she has been able to keep everything  down, although her p.o. intake is poor.   I had spoke to the patient directly. She was able to speak in full  sentences and seemed quite appropriate. I recommended that she push  fluids and continue to take the Cipro. If however, either she is not  able to the Cipro down or continue to push fluids, or if her condition  deteriorates in any way, she is to go back to the emergency room. She is  voiding fine and denies any back or flank pain that might suggest any  form of obstruction.   Her studies were reviewed from the emergency room and did include a  [place] [address], and a BUN of 19.     Charlaine Dalton. Sherene Sires, MD, [facility]  Electronically Signed    MBW/MedQ  DD: 04/24/2007  DT: 04/25/2007  Job #: [id number]   cc:   Lonzo Cloud. Kriste Basque, MD

## 2011-04-10 NOTE — Assessment & Plan Note (Signed)
[name]                            CARDIOLOGY OFFICE NOTE   [name], [name]                       MRN:          [id number]  DATE:05/14/2007                            DOB:          1928-12-22    [name] returns today for followup.  She was actually added on with her  husband.  The patient has had long standing atrial fibrillation.  She  has been on a Coumadin alternative study drug for about 32 weeks.   The patient saw Dr. Kriste Basque recently for fairly acute onset of left upper  flank pain with fever.  There was a question of pyelonephritis and UTI.  A CT scan done on May 12, 2007 showed a splenic infarct.  I talked to  [name] at length today regarding the fact that I considered this a  failure of the study drug. Fortunately the infarct did not involve her  cerebrovascular system.   We have tried to cardiovert [name] in the past and she is in chronic  AFib.  I reviewed all of her records from the Coumadin lab and her INRs  on study drug had been therapeutic in the 2 to 2.4 range.  Particularly  given this, I told [name] that I would like her to transition her  regular Coumadin.  This will involve  Lovenox.  I personally talked to  [name] about this and she will make these arrangements.  [name]  seems to be feeling better.  She initially had the acute onset of left  upper flank pain.  She had some urinary frequency.  I do not have Dr.  Jodelle Green notes but she was placed on an antibiotic.   The patient has not had any recurrent pain.  She has had no nausea and  vomiting.  There has been no diarrhea.  She does have a history of colon  CA status post resection and this seems to be doing fine.   Her review of systems is otherwise negative.   MEDICATIONS:  Include:  1. Climara 0.1 mg a week.  2. Synthroid 125 mcg daily.  3. To be back on Coumadin.  4. Lipitor 20 daily.  5. Caltrate.  6. Accupril 20 daily.  7. Detrol.  8. Coreg 3.5  b.i.d.  9. K-dur 40 daily.  10.Lasix 80 daily.  11.Metolazone 5 mg daily.   EXAM:  She is an elderly appearing white female in no distress.  Her weight is 197, blood pressure is 130/70, pulse is 70 and irregular.  Respiratory rate is 14.  She is afebrile now.  HEENT:  Normal.  There is no carotid bruits.  No JVP elevation. No thyromegaly, no  lymphadenopathy.  LUNGS:  Clear without wheezing.  Normal diaphragmatic motion.  There is an S1 and S2.  The systolic ejection murmur, PMI is normal.  ABDOMEN:  Is currently benign.  She does not have any left upper flank  pain.  There is no AAA, no hepatosplenomegaly. No hepatojugular reflux.  Femorals are +3 bilaterally without bruit.  Distal pulses are intact  with trace edema.  NEURO:  Nonfocal.  There is no other stigmata or peripheral emboli.  There is no muscular weakness.   IMPRESSION:  1. Chronic AFib.  Splenic infarct on research study drug, transitioned      to Coumadin. Follow up with [name] to get Lovenox started.      Hopefully this will prevent any recurrent infarcts or emboli.  2. Hypertension.  Currently well controlled.  Continue a low sodium      diet.  Continue current medications including Angiotensin-Converter      Enzyme inhibitor.  3. Hypothyroidism.  Continue current dose of Synthroid.  Check TSH in      6 months.  4. Hypercholesterolemia.  Last LDL under 100 on statin drug.  No      evidence of coronary artery disease.  5. Colon cancer with previous resection.  Follow up with primary      medical doctor regarding surveillance with colonoscopy.  6. The patient apparently has a followup appointment with Dr.      __________ regarding her splenic infarct.  I do not think it would      need surgical removal; however, I would probably do a followup CT      scan in 2 to 4 weeks.  7. Urinary tract infection, question polynephritis. Follow up urine      culture and antibiotics per Dr. Kriste Basque.   We will see [name] back  in about 3 months and hopefully she will  transition to Coumadin without difficulty.     [name]. [name], MD, [facility]  Electronically Signed    PCN/MedQ  DD: 05/14/2007  DT: 05/14/2007  Job #: [id number]

## 2011-04-10 NOTE — Discharge Summary (Signed)
[name]                ACCOUNT NO.:  [id number]   MEDICAL RECORD NO.:  [id number]          PATIENT TYPE:  INP   LOCATION:  6730                         FACILITY:  [facility]   PHYSICIAN:  Lonzo Cloud. Kriste Basque, MD     DATE OF BIRTH:  17-Feb-1929   DATE OF ADMISSION:  04/24/2007  DATE OF DISCHARGE:  04/29/2007                               DISCHARGE SUMMARY   FINAL DIAGNOSES:  1. Admitted Apr 24, 2007 via the emergency room with a urinary tract      infection, high fever, nausea and vomiting.  Evaluation revealed a      kidney infection with a resistant E coli sensitivity testing      showing resistance to ampicillin, Cipro, gentamycin, Levaquin and      intermediate to tobramycin; sensitive to Keflex, Rocephin and      sulfa.  Patient treated with intravenous Rocephin, switched to p.o.      Keflex and improved.  2. History of atrial fibrillation on Coumadin, followed by Dr. [name].  3. Mitral stenosis and mitral regurgitation on 2-D echo.  4. Arteriosclerotic peripheral vascular disease with tortuous aorta on      chest x-ray.  5. Hypertension - controlled on medications.  6. History of pneumonia with previous hospitalization, 2004.  7. History of colon cancer, 1994, with right hemicolectomy and no      known recurrence since then.  8. History of hypothyroidism on Synthroid.  9. History of low back pain with previous lumbar laminectomy for      spinal stenosis at L3-4 in 2006.  10.Degenerative arthritis and osteoporosis with partial compression      fracture.  11.Recent left carpal tunnel surgery by Dr. [name] in April 2008.  12.Known shoulder arthritis and C spine arthritis.  13.History of gynecologic surgery with anteroposterior repair, May      2007, by Dr. [name].  14.Status post hysterectomy.  15.History of shingles in the past.  16.History of basal cell skin cancer with Mohs therapy.   BRIEF HISTORY AND PHYSICAL:  1. The patient is a 78-year white female, known to me with  multiple      medical problems as noted above.  She was last seen by me in      November 2007, but has had several followup visits with Dr. [name]      in the interim.  She presented to emergency room stating that she      was well until 2 days prior to admission when she noted the onset      of fever which got to 103 degrees orally, chills and feeling      poorly, but no other localizing signs or symptoms.  She came to the      [facility] Emergency Room where she was evaluated and a urinalysis was      abnormal showing a urine tract infection with white cells and      bacteria.  Culture was plated, and she was started on Cipro.  On      returning home, she says she felt terrible, had recurrent spiking  temps to 103 degrees with persistent chills and subsequent nausea,      vomiting and inability keep anything down.  She returned to the      emergency room and was referred for admission.  On her recheck her      hemoglobin was 11.3, potassium 2.9 and urinalysis appeared      improved.  However, the urine culture was growing 100,000 colonies      of a gram-negative rod that subsequently proved to be resistant to      Cipro.   PAST MEDICAL HISTORY:  As noted, she has a history atrial fibrillation  and is on Coumadin, and a research drug followed by Dr. [name]  regularly.  She had abnormal 2-D echo in 2003 showing some mitral  stenosis and mitral regurg and left atrial enlargement.  She has  cardiomegaly seen on previous chest x-rays.  She has arteriosclerotic  peripheral vascular disease with a tortuous aorta seen on these same  films.  She has hypertension that is controlled on several drugs with  good control.  She has history of pneumonia in the past and was  hospitalized in 2004 with a good recovery.  She has a history of  hypothyroidism and takes Synthroid.  She had colon cancer in 1994, and  had a right hemicolectomy with no known recurrence.  She has  degenerative arthritis and a  history of low back pain with a previous  lumbar laminectomy for L3-4 spinal stenosis in 2006.  This was done by  Dr. [name].  She has a partial compression fracture as well.  She had  recent left carpal tunnel surgery by Dr. [name] in April 2008.  She has  known shoulder arthritis as well.  She had a previous hysterectomy and  subsequent AP repair in May 2007 by Dr. [name].  She has history of  shingles in the past.  She has had skin cancers with Mohs therapy.   PHYSICAL EXAMINATION:  A 78-year white female acutely ill with a  temperature of 103 and somewhat tearful because of her plate.  Blood pressure 110/70; pulse 100 and irregular; respirations 20 per  minute, not labored; temperature 103 degrees orally.  HEENT EXAM:  Negative except for dry mucous membranes.  NECK EXAM:  Showed decreased neck motion.  Some discomfort for cervical  arthritis.  There is no jugular distension or bruits.  CHEST:  Clear to percussion and auscultation.  CARDIAC EXAM:  Irregular rhythm, grade 1/6 systolic ejection murmur at  sternal border.  No rubs or gallops detected.  ABDOMEN:  Soft, nontender without evidence organomegaly or masses.  There was no CVA tenderness on testing.  EXTREMITIES:  Some chronic  venous insufficiency changes, but no cyanosis, clubbing or edema.  NEUROLOGIC EXAM:  No focal abnormalities.   LABORATORY DATA:  EKG showed atrial fibrillation with a rapid ventricle  response around 120.  There were nonspecific ST-T wave changes noted.  Chest x-ray showed cardiomegaly and mild interstitial markings  compatible with some mild edema.  Ultrasound of the abdomen showed no  hydronephrosis and was essentially negative.  Hemoglobin 11.5,  hematocrit 34.1, white count 12,000 with 87% segs.  Pro time 23.7,  PT/INR 2, PTT 50 seconds.  Sodium 137, potassium 2.8, repeat 3.4,  chloride 106 CO2 26, BUN 14, creatinine 0.8, blood sugar 144, calcium 7.7, total protein 5.7, albumin 3.1, AST 24, ALT 19, alk  phos 59, total  bilirubin 1.6 BNP 284, cholesterol 100, triglycerides 65, HDL 40,  LDL  47.  Urinalysis clear.  Two blood cultures negative.  Urine culture grew  resistant E coli as noted above.   HOSPITAL COURSE:  The patient was admitted with a refractory urine tract  infection that was unresponsive to Cipro as an outpatient.  She was  placed on IV Rocephin and tobramycin was added till sensitivities  returned.  When sensitivities showed resistance to Cipro and Levaquin  but sensitive to Rocephin and Keflex, she was switched to p.o. Keflex  and she remained afebrile thereafter.  The patient was quite weak  throughout her hospital stay.  She was hypokalemic on admission and had  been taking Lasix and Zaroxolyn per cardiology.  This was despite 4  potassium supplement pills a day.  With this urine tract infection, her  Zaroxolyn was discontinued and she was kept on her potassium supplement  and Lasix.  This resulted in improvement in her potassium to 3.4 prior  to discharge.   The patient was seen by physical therapy, occupational therapy, and the  care management team.  PT and OT did not feel she needed any additional  therapy in the hospital, and the care management team took care of any  of her post hospital needs.  She was felt to be MHB and ready for  discharge on April 29, 2007.   MEDICATIONS AT DISCHARGE:  1. Keflex 500 mg p.o. t.i.d. for 1 more week till gone.  2. Accupril 40 mg p.o. daily.  3. Coreg 3.125 mg p.o. b.i.d..  4. Lasix 40 mg p.o. q.a.m..  5. K-20 two p.o. b.i.d..  6. Lipitor 20 mg p.o. nightly.  7. Continue same Coumadin as before with followup in the Coumadin      clinic.  8. Detrol LA 4 mg p.o. daily.  9. Neurontin 300 mg p.o. nightly.  10.Synthroid 0.125 mg p.o. daily.  11.Climara 0.1 mg p.o. weekly.  12.Calcium supplement daily.  13.Multivitamin supplement daily.   The patient was instructed to stop her previous Zaroxolyn for now.  She  will follow up  with [place] in the office in 1 week.  Follow up with Dr.  [name] for cardiology after that.   CONDITION ON DISCHARGE:  Improved.      Lonzo Cloud. Kriste Basque, MD  Electronically Signed     SMN/MEDQ  D:  04/29/2007  T:  04/29/2007  Job:  [id number]

## 2011-04-10 NOTE — Assessment & Plan Note (Signed)
[facility]                            CARDIOLOGY OFFICE NOTE   AARON, BOEH                       MRN:          [id number]  DATE:04/07/2007                            DOB:          07-25-1929    [name] returns today for follow up.  She has chronic atrial fibrillation  with high blood pressure and hypercholesterolemia.  When I last saw her,  she was having significant carpal tunnel syndrome.  She has had her  right wrist done by Dr. [name] 6 years ago.  She is status post right  median nerve release.  She is status post left median nerve release and  has had marked improvement in her symptoms.   From a cardiac perspective, she has been stable.  Despite being in  chronic atrial fibrillation, she has had no palpitations, no syncope, no  chest pain, PND or orthopnea.  She does not have any known coronary  artery disease.  She has good left ventricular function and +1 mitral  insufficiency.   She has been seen in the Coumadin Clinic on a regular basis and has not  had any bleeding problems.  In regards to her heart, she has had some  diastolic dysfunction in the past.  She tends to get lower extremity  edema.  This has been somewhat more problematic lately.  She knows how  to adjust her Lasix and take an extra evening dose with potassium.  She  takes her Zaroxolyn 30 minutes before her Lasix in the morning.  She has  been compliant with a low salt diet.   REVIEW OF SYSTEMS:  Otherwise negative.   MEDICATIONS:  1. Climara 0.1 mg every other week.  2. Synthroid 125 mcg.  3. Coumadin as directed.  4. Lipitor 20 q.h.s.  5. Caltrate.  6. Centrum.  7. Accupril 20 a day.  8. Detrol 4 mg a day.  9. Coreg 3.125 b.i.d.  10.K-Dur 40 mEq a day.  11.Lasix 80 in the morning and 80 at night p.r.n.  12.Metaxalone 5 mg a day.   PHYSICAL EXAMINATION:  GENERAL:  Her exam is remarkable for a healthy-  appearing elderly white female in no distress.  Mood  is appropriate.  VITAL SIGNS:  Her weight is 209, which is identical to March of 2008.  Blood pressure is 101/80, pulse is 82 and regular.  HEENT:  Normal.  NECK:  Carotid normal without bruit.  There is no JVP elevation.  There  is no thyromegaly and no lymphadenopathy.  LUNGS:  Clear with no wheezing and normal diaphragmatic motion.  CARDIAC:  Normal with an S1 and S2.  There is no murmur, rub, gallop or  click.  PMI is not displaced and normal.  ABDOMEN:  Bowel sounds are positive.  She is status post colon surgery.  There is no hepatosplenomegaly, no hepatojugular reflux, no tenderness.  No abdominal aortic aneurysm.  EXTREMITIES:  Distal pulses are intact.  There are no femoral bruits.  PTs are +2 bilaterally.  There is no lymphadenopathy or varicosities.  There is +1 edema bilaterally.  NEUROLOGIC:  Nonfocal.  There is no muscular weakness.   IMPRESSION:  Chronic atrial fibrillation with good rate control.  Continue current dose of beta blocker.   The patient will follow up in the Coumadin Clinic and continue her  anticoagulation for stroke prophylaxis.   We will recheck a lipid and liver profile in 6 months.   She will have a BMET today since she is having an increase in her p.m.  dosage of Lasix.  She will continue her Zaroxolyn and Lasix for her  lower extremity edema.  This tends to be dependent, and I do not think  she needs an ultrasound or any further work-up for this.   As long as her BMET is fine, we will keep her on her current dosages.  She has been prone to hypokalemia and has required quite a bit of  potassium replacement.   The patient has had a good result from her median nerve release on the  left.  Overall, her quality of life is excellent, and I will see her  back in 6 months.     [name]. [name], MD, [facility]     PCN/MedQ  DD: 04/07/2007  DT: 04/07/2007  Job #: [id number]

## 2011-04-10 NOTE — Assessment & Plan Note (Signed)
[facility]                            CARDIOLOGY OFFICE NOTE   COLA, GANE                       MRN:          [id number]  DATE:07/21/2007                            DOB:          01/07/1929    [name] returns today for followup.  She has had a complicated last month  or two.  She is in chronic atrial fibrillation.  She had splenic infarct  on research study drug.  We had to bridge her with Lovenox and switch  her over to Coumadin.  Her INR had been somewhat subtherapeutic up until  this week.  It was 2.5 last Friday.  She has not had any untoward  effects or recurrent emboli.  In regards to her splenic infarct, she saw  the surgeons and will not require surgery.  There is also a question of  UTI or pyelonephritis.  This seems to have cleared up on antibiotics.   In regards to her heart, she has not had any significant palpitations,  PE, ND, or orthopnea.  She has mild chronic lower extremity edema which  is unchanged.  She has not had any bleeding diathesis.  She does have a  history of colon cancer with previous colectomy but has not had any GI  bleeding.   There is no documented coronary disease.  She does have hypertension and  hypercholesterolemia.   She has been compliant with her medications and in general is feeling  much better.  She continues to work at [employer] on a regular basis.   REVIEW OF SYSTEMS:  Otherwise negative.   MEDICATIONS:  1. Climara 0.1 mg daily.  2. Synthroid 125 mcg daily.  3. Protegra.  4. Coumadin as directed.  5. Lipitor 20 daily.  6. Caltrate.  7. Centrum.  8. Accupril 20 daily.  9. Detrol LA 4 mg daily.  10.Coreg 3.125 b.i.d.  11.K-Dur 20 daily.  12.Furosemide 40 daily.   EXAM:  She is a healthy-appearing elderly white female in no distress.  Affect is appropriate.  Weight is 202 which is stable for her.  Respiratory rate is 14.  Blood  pressure is 129/74.  Pulse is 68 and irregular.  HEENT:   Normal.  NECK:  Supple.  There is no lymphadenopathy, no thyromegaly, and no JVP  elevation.  No bruits.  LUNGS:  Clear.  Good diaphragmatic motion.  No wheezing.  There is an S1  and S2 with a mild AS murmur.  PMI is normal.  ABDOMEN:  Protuberant.  Bowel sounds are positive.  She is status post  colectomy.  No hepatosplenomegaly, no hepatojugular reflux.  No  tenderness.  No AAA, no bruits.  Femorals are +4 bilaterally without bruit.  PTs are +3.  There is trace  lower extremity edema.  Neuro:  Nonfocal.  SKIN:  Warm and dry.  There is no muscular weakness.   Her baseline EKG shows atrial fibrillation with nonspecific ST-T wave  changes, particularly in the inferolateral leads.   IMPRESSION:  1. Chronic atrial fibrillation now on Coumadin for anticoagulation.      Follow up in  the clinic q 2 weeks until we stabilize her in the 2.5      range.  Rate control currently adequate on Coreg.  2. Murmur, previous echocardiogram in February 2007 showing question      mild mitral stenosis with question patent foramen ovale and aortic      sclerosis.  Her murmur seems more aortic to me, and we will check a      2 D echocardiogram with bubble study to rule out shunting and also      to reassess her valves.  3. Hypertension, currently well controlled.  Continue beta blocker,      diuretic, and accupril, low salt diet.  4. Lower extremity edema currently improved on 40 of Lasix, elevate      legs at the end of the day.  No evidence of previous deep venous      thrombosis.  5. Hypercholesterolemia.  No history of coronary artery disease.      Follow up lipid and liver profile in 6 months.  I believe her last      LDL cholesterol was under 106 on 20 of Lipitor.  6. Hypothyroidism.  Last TSH in normal range.  Synthroid 125 mcg a day      to be followed by Dr. Kriste Basque.  7. Bladder spasms with recent urinary tract infection, currently      clear, not on antibiotics.  Continue Detrol 4 mg daily.  8.  Overall, Peja seems to be doing better and as long as her      echocardiogram has no surprises, I will see her back in 6 months.     [name]. [name], MD, [facility]  Electronically Signed    PCN/MedQ  DD: 07/21/2007  DT: 07/21/2007  Job #: [id number]

## 2011-04-10 NOTE — Assessment & Plan Note (Signed)
[facility]                            CARDIOLOGY OFFICE NOTE   AMANDA, STEUART                       MRN:          [id number]  DATE:05/29/2007                            DOB:          04-19-1929    REASON FOR VISIT:  Honey returns today for followup.  She has had  chronic atrial fibrillation.  She was in an anticoagulation trial which  we had randomized her in since she appeared to have a splenic infarct.  She has been having her Coumadin's followed quite closely and they are  in the 2.5 to 2.8 range.  She has hypertension, hypercholesterolemia and  a history of colon CA.   Dr. Kriste Basque had her on a Fentanyl patch 25 mcg q.3 days.  She ran out of  this and is continuing to have some left upper quadrant pain.  She has  been evaluated by Dr. Luan Pulling and conservative therapy was  recommended with no need for surgery at this time.  Eileen does feel  better.  She has not had a fever at home.  There has been no nausea or  vomiting.  Her belly pain is improving.  She is not having any  significant diarrhea.   She is on therapeutic Coumadin at this point.   REVIEW OF SYSTEMS:  Her review of systems is otherwise negative.   CURRENT MEDICATIONS:  Her current medications include:  1. Climara 0.1 daily.  2. Synthroid 125 mcg per day.  3. Vitegra.  4. Coumadin.  5. Lipitor 20 a day.  6. Caltrate.  7. Accupril 20 a day.  8. Detrol.  9. K-Dur __________ a day.  10.Lasix 40 a day.   PHYSICAL EXAMINATION:  GENERAL APPEARANCE:  Her exam is remarkable for  an elderly-appearing white female in no distress.  VITAL SIGNS:  Her blood pressure is 130/70. She is in atrial  fibrillation at a rate of 80.  HEENT: Normal.  NECK:  Carotids are normal without bruits.  There is no lymphadenopathy  and no thyromegaly.  LUNGS:  Clear with good diaphragmatic motion.  No wheezing.  HEART:  There is an S1, S2 with a soft systolic murmur.  PMI is normal.  ABDOMEN:   There is mild left upper quadrant pain to deep palpation but  no rebound.  Bowel sounds are positive.  There is no AAA.  There is no  hepatosplenomegaly.  EXTREMITIES:  Distal pulses are intact with no edema.  NEUROLOGICAL:  Nonfocal.  There is no muscular weakness.  SKIN:  Warm and dry.   IMPRESSION:  1. Chronic atrial fibrillation, rate controlled currently adequate on      Coreg 3.125 mg b.i.d.  Continue anticoagulation with Coumadin.  2. Splenic infarct, probably followup CT scan at the end of the month.      Her pain is improving.  I refilled her prescription for Fentanyl      patch.  She knows to call us if she were to have any increase in      her pain or fever to suggest abscess formation.  3. Hypertension.  Currently  well controlled.  Continue current dose of      Lasix, ACE inhibitor and beta blocker.  4. Lower extremity edema, currently resolved.  Continue Lasix and      potassium at current dosages.  5. Hypothyroidism.  Followup TSH in six months.  Continue Synthroid at      125 mcg per day.   Overall, I am glad Shaniece is feeling better.  It was unfortunate that  she had an embolus with her chronic atrial fibrillation to her spleen.     [name]. [name], MD, [facility]  Electronically Signed    PCN/MedQ  DD: 05/29/2007  DT: 05/30/2007  Job #: [id number]

## 2011-04-10 NOTE — H&P (Signed)
NAMEPHILLIP, [name]                ACCOUNT NO.:  [id number]   MEDICAL RECORD NO.:  [id number]          PATIENT TYPE:  INP   LOCATION:  1845                         FACILITY:  [facility]   PHYSICIAN:  [name] L. Pernell Dupre, MD    DATE OF BIRTH:  12-21-1928   DATE OF ADMISSION:  04/24/2007  DATE OF DISCHARGE:                              HISTORY & PHYSICAL   SERVICE:  [facility].   CARDIOLOGIST:  Dr. Charlton Haws.   PRIMARY CARE PHYSICIAN:  Dr. Kriste Basque.   CHIEF COMPLAINT:  Fever, nausea, vomiting.   HISTORY OF PRESENT ILLNESS:  Thi si sa 77 year old female with history  of chronic atrial fibrillation, hypertension, hypercholesterolemia,  hypothyroidism, who presents with nausea, vomiting, fever.  The patient  states that yesterday she developed chills, fevers, and just generalized  pain all over.  No lumps, bumps, rashes, or cough.  No problems with  urination, no problems with bowel movements.  No frequency of urination,  no blood in her urine, no blood in her stool.  No sick contacts, no  recent travel.  She presented to the ER yesterday and was given  ciprofloxacin and sent home.  Now, she re-presents with nausea,  vomiting, failure to thrive, and fever.   ALLERGIES:  1. ADHESIVE TAPE.  2. CODEINE.   MEDICATIONS:  1. Climara 0.1 mg every week.  2. Synthroid 0.125 mcg daily.  3. Coumadin.  4. Lipitor 20 mg a day.  5. Calcitrate.  6. Centrum.  7. Accupril 20 mg a day.  8. Detrol 4 mg daily.  9. Coreg 3.125 mg twice a day.  10.K-Dur 40 mg a day.  11.Lasix 80 mg twice a day.  12.Metolazone 5 mg once a day.   PAST MEDICAL HISTORY:  1. Atrial fibrillation, chronic, on a study medication.  EF normal, 1+      MR per Dr. Fabio Bering note.  2. Hypertension.  3. Hypercholesterolemia.  4. Hypothyroidism on Synthroid 0.5 mcg.  5. Endometrial cancer in remission.  6. Also colon cancer in remission.   SOCIAL HISTORY:  Unremarkable.   FAMILY HISTORY:  A sister with diabetes  type 2, another sister with  hypertension, another sister with CVA.  A brother with lung cancer.  Mother died at 47 from melanoma.   REVIEW OF SYSTEMS:  Positive for fever, chills, nausea, vomiting.  All  other systems are negative.   PHYSICAL EXAMINATION:  GENERAL:  She is alert and oriented x3.  No  apparent distress.  Pulse 118.  HEENT:  Pupils are equal, round, reactive to light.  Extraocular  movements are intact.  No lymphadenopathy, no carotid bruits.  CARDIOVASCULAR:  Irregularly regular.  No murmurs, rubs, or gallops.  LUNGS:  Clear to auscultation bilaterally.  ABDOMEN:  Soft, nontender, nondistended.  Positive bowel sounds.  No CVA  angle tenderness.  EXTREMITIES:  Without clubbing, cyanosis or edema.  Strong pulses.   DIAGNOSTIC STUDIES:  Chest x-ray done on Apr 23, 2007:  Cardiomegaly  showing left lower lobe no acute cardiopulmonary process.   LABORATORY DATA:  Yesterday he had a positive UA,  positive nitrites,  positive leukocytes.  White count today is 15, hemoglobin and hematocrit  15/37, platelets 244.  Creatinine 0.9, glucose 120.  Urine culture is  pending.  Blood cultures x2 are pending.   ASSESSMENT AND PLAN:  1. Urinary tract infection.  The patient continues to be symptomatic,      unable to hold down antibiotics considering nausea and vomiting.      Therefore will admit for hydration, antibiotics, pain management      (ceftriaxone and morphine).  Urine culture and blood cultures are      pending.  2. Atrial fibrillation.  The patient continues to be on a study      medication for anticoagulation. Will check PT INR and PTT.  3. Hypertension.  Continue antihypertensives.  4. Hyperlipidemia.  Check TSH, check lipid panel.  Continue her      Statin.  5. Hypothyroidism.  Continue Synthroid.  Check a TSH.      [name] L. Pernell Dupre, MD  Electronically Signed     GLA/MEDQ  D:  04/25/2007  T:  04/25/2007  Job:  [id number]

## 2011-04-10 NOTE — Assessment & Plan Note (Signed)
[facility]                            CARDIOLOGY OFFICE NOTE   PRACHI, OFTEDAHL                       MRN:          [id number]  DATE:11/29/2008                            DOB:          1929-06-07    [name] returns today for followup.  She has not quite felt well lately.  She has some fatigue and occasional exertional dyspnea.  She has noted  increased lower extremity swelling.  She initially saw Dr. Kriste Basque for  what was thought to be a neuropathy.  This was evaluated by Neuro.  She  was not started on Neurontin.  She was a little bit hesitant to start  any new medication since she takes Coumadin.  She subsequently had  swelling in her legs and was told by Dr. Kriste Basque that there was a question  of arthritis and saw Dr. Jerl Santos.  He was more worried about her  circulation and referred her back here.  [name] has never had a problem  with vascular disease.  She has no evidence of previous coronary artery  disease and did have a peripheral vascular runoff exam in July 2008,  which showed mild right renal artery stenosis, but no PVD with three-  vessel runoff bilaterally.   She has a history of venous insufficiency with lower extremity edema.  She tends to have problems with low potassium.  She takes her Lasix more  than twice a day.   From a cardiac perspective, she has had hypertension and chronic atrial  fibrillation, on Coumadin therapy.  She has not had significant  palpitations.  Her anticoagulation has been good.  Her review of systems  otherwise remarkable for fatigue and her neuropathy otherwise is  negative.  She takes Coumadin as directed, Coreg 3.125 b.i.d., Accupril  20 a day, Lasix 40 a day, Klor-Con 20-mEq pack 4 tablets b.i.d., Lipitor  20 a day, Synthroid 125 mcg a day, Climara, Caltrate, Centrum Protegra,  Mobic p.r.n., tramadol p.r.n., Tylenol, meclizine p.r.n., and alprazolam  0.25 mg p.r.n.   PHYSICAL EXAMINATION:  GENERAL:  A  well-preserved, elderly female in no  distress.  VITAL SIGNS:  She is in AFib with a rate of 70, blood pressure is  150/80, respiratory rate 14, and afebrile.  HEENT:  Unremarkable.  NECK:  Carotids are normal without bruit.  No lymphadenopathy,  thyromegaly, or JVP elevation.  LUNGS:  Clear with good diaphragmatic motion.  No wheezing.  CARDIAC:  S1 and S2, normal heart sounds, PMI normal.  ABDOMEN:  Benign.  Bowel sounds positive.  No AAA, no tenderness, no  bruit, no hepatosplenomegaly or hepatojugular reflux.  No femoral  bruits.  EXTREMITIES:  Popliteals are +3 bilaterally.  DP is +3 in the right and  +2 in the left.  I used a Doppler.  She has near triphasic pulses in the  left.  On the right, DP and biphasic pulses; on the left, DP.  Remainder  of her pulses in her feet were difficult to palpate due to her edema.  NEUROLOGIC:  Nonfocal.  SKIN:  Warm and dry.  MUSCULOSKELETAL:  No  muscular weakness.   IMPRESSION:  1. Atrial fibrillation, good rate control.  Continue current dose of      Coreg.  2. Anticoagulation with Coumadin.  Continue to follow up in the      clinic.  No bleeding diathesis.  INRs have been therapeutic.  3. Hypercholesterolemia.  Continue Lipitor.  Lipid and liver profile      in 6 months.  4. Lower extremity edema.  Add Aldactone 25 mg a day in lieu of her      tendency towards hypokalemia.  I would rather do this and increase      her dose of Lasix, which would require many more tablets of      potassium.  Followup BMET in 4 weeks.  5. Tingling in the lower extremities and feet, likely related to lower      extremity edema and neuropathy.  I told her not to be afraid to      start something like Lyrica or Neurontin.  For the time being, we      will do exercise and resting ankle-brachial indices, although I      think, her pulses are quite good enough that they would not      represent a problem in regards to her symptoms.  We will see how      she  feels if her edema improves.  This may help with the tingling      in her feet, but I suspect eventually a trial of Neurontin would be      an order.  6. Hypothyroidism.  Continue Synthroid.  TSH and T4 in 6 months.  7. Hypertension, currently well controlled.  Continue current dose of      Accupril and low-sodium diet.  Given the patient's fatigue and      chronic atrial fibrillation, we will recheck her 2-D echocardiogram      to assess left ventricular function and rule out any new valvular      problems.     [name]. Chloe Emms, MD, [facility]  Electronically Signed    PCN/MedQ  DD: 11/29/2008  DT: 11/30/2008  Job #: [id number]

## 2011-04-10 NOTE — Assessment & Plan Note (Signed)
[facility]                            CARDIOLOGY OFFICE NOTE   DURU, REIGER                       MRN:          [id number]  DATE:08/03/2008                            DOB:          June 05, 1929    Nela returns today for followup.  She has chronic AFib on Coumadin.  Her INRs have been great.  She has not had any bleeding diathesis.  She  saw Shelby Dubin in the [facility] last week.  She has hypertension,  which has been well controlled.   She also has hypercholesterolemia with no history of coronary artery  disease.  She has been doing fairly well.  She seems to be developing  neuropathy in her lower extremities.  Apparently, she has an appointment  with a neurologist.  I explained to her that they would likely have her  start Neurontin.   This is clearly not claudication.  Her symptoms are nonexertional.  It  tend to be worse at night.  She gets tingling in her lower extremities  and feet.   REVIEW OF SYSTEMS:  Otherwise negative and particularly she has not had  any TIA like symptoms, bleeding diathesis, or palpitations.   CURRENT MEDICATIONS:  1. Climara 0.1 mg a week.  2. Synthroid 125 mcg a day.  3. Protegra.  4. Coumadin as directed.  5. Lipitor 20 a day.  6. Caltrate.  7. Centrum.  8. Accupril 20 mg a day.  9. Coreg 3.125 b.i.d.   PHYSICAL EXAMINATION:  GENERAL:  Remarkable for a well-preserved elderly  white female in no distress.  VITAL SIGNS:  She is in AFib at a rate of 85, blood pressure is 132/80,  weight 205, respiratory rate 14, and afebrile.  HEENT:  Unremarkable.  NECK:  Carotids are normal without bruit.  No lymphadenopathy,  thyromegaly, or JVP elevation.  LUNGS:  Clear.  Good diaphragmatic motion.  No wheezing.  CARDIAC:  S1 and S2 with systolic ejection murmur.  PMI normal.  ABDOMEN:  Benign.  Bowel sounds positive, status post abdominal surgery.  No AAA.  No bruit.  No tenderness.  No  hepatosplenomegaly.  No  hepatojugular reflux.  No tenderness.  EXTREMITIES:  Distal pulses are intact.  No edema.  NEURO:  Nonfocal.  SKIN:  Warm and dry.  No muscular weakness.   IMPRESSION:  1. Chronic atrial fibrillation, good rate control on anticoagulation.      Continue current drugs.  2. Hypertension, currently well controlled.  Continue low-salt diet      and current dose of Accupril.  3. Probable neuropathy.  Follow up with neurology, likely to start      Neurontin.  I explained to her that she would need to ask them      regarding starting dose and want to escalate the dose.  She may      also be a candidate for Keppra.  4. Hyperlipidemia.  Continue Lipitor.  Lipid and liver profile in 6      months.  Overall, Miabella is doing well.  She continues to work part-  time at [employer].  She had her 15th anniversary there.  Her and [name]      are doing well.  They have a new dog, named Miss 4.  She [place]      [place] her sister Ruby's 80th      birthday and continues to enjoy good quality of life.      [name]. [name], MD, [facility]  Electronically Signed    PCN/MedQ  DD: 08/03/2008  DT: 08/03/2008  Job #: [id number]

## 2011-04-13 NOTE — Assessment & Plan Note (Signed)
[facility]                                 ON-CALL NOTE   DOROTHE, ELMORE                       MRN:          [id number]  DATE:09/28/2006                            DOB:          07/23/1929    DATE OF PHONE CALL:  September 28, 2006 at approximately 15:14.   SUPERVISING PHYSICIAN:  Dr. [name].   CARDIOLOGIST:  [name]. [name], MD, [facility]   SUMMARY OF HISTORY:  Mr. Kathern Lobosco called in regards to his wife  [name], stating that she was not feeling well.  I asked to speak  to the patient.  [name] stated that, when she woke up this morning,  she just noted decreased energy.  She denied any problems with chest  discomfort, shortness of breath, syncope or dizziness.  She states that  she did check her blood pressure, and it is running in the low 90s to  low 100s.  She states that usually her blood pressure is in the 130s.  She also relays a past history when her blood pressure has gotten low  that she also feels the same way with decreased energy and general  fatigue.   She states that she has been eating well and has not had any problems  with nausea, vomiting or diarrhea.  On review of her medications, I  asked her not to take her evening dose of Lasix on Saturday.  She stated  that she had taken all her other medications earlier Saturday morning,  and this is the only medication that she was due from the afternoon.  I  also asked her on Sunday to continue to check her blood pressure to see  if her symptoms improved.  If her symptoms did not improve, I asked her  to call me back over the weekend for further plans.  If she felt better  Sunday morning, she would continue to monitor her blood pressure and  take her medications as prescribed and to follow up with Dr. [name] as  needed.  I also advised her if she did began to feel weak, dizzy or  experience any of the above-mentioned symptoms, to please call us back  or present to the nearest  emergency room.  She was acceptable with plan.      Joellyn Rued, PA-C  Electronically Signed      [name]. [name], MD, [facility]  Electronically Signed   EW/MedQ  DD: 09/30/2006  DT: 09/30/2006  Job #: [id number]

## 2011-04-13 NOTE — Op Note (Signed)
[name], [name] NO.:  [id number]   MEDICAL RECORD NO.:  [id number]          PATIENT TYPE:  INP   LOCATION:  9399                          FACILITY:  [facility]   PHYSICIAN:  Freddy Finner, M.D.   DATE OF BIRTH:  01-03-1929   DATE OF PROCEDURE:  04/24/2006  DATE OF DISCHARGE:                                 OPERATIVE REPORT   PREOPERATIVE DIAGNOSIS:  Cystocele with prolapse, vaginal prolapse,  rectocele.   PROCEDURE:  Anterior and posterior colporrhaphy, sacrospinous ligament  suspension of the vagina, placement of suprapubic catheter.   SURGEON:  Freddy Finner, M.D.   ASSISTANT:  Guy Sandifer. Henderson Cloud, M.D.   ANESTHESIA:  General.   ESTIMATED BLOOD LOSS:  300 mL.   COMPLICATIONS:  None.   INDICATIONS FOR PROCEDURE:  Details of the present illness are recorded in  the admission note.  Briefly, the patient has had a marked increase in  symptomatic problems with vaginal prolapse.  She had appropriate monitoring  with Dr. [name] for her Coumadin therapy preoperatively and was known to  have normal coags on the morning of surgery.  She has a history of mitral  valve prolapse and was given ampicillin and gentamicin preoperatively.  She  was placed in PAS antiembolic hose.   DESCRIPTION OF PROCEDURE:  She was brought to the operating room and there  placed under adequate general anesthesia and placed in the dorsal lithotomy  position.  The mons, perineum, and vagina were carefully prepped with  Betadine scrub followed by Betadine solution.  Sterile drapes were applied.  A weighted posterior vaginal retractor was placed.  The mucosa anteriorly on  the vaginal wall was grasped with Allis clamps over the cystocele and an  incision made in the midline with the scalpel.  With careful blunt and sharp  dissection, the incision was extended to a level approximately 1.5 cm in the  urethral meatus.  With careful blunt and sharp dissection of the bladder and  vesicovaginal  fascia was separated from the mucosa.  Using interrupted 0  Monocryl sutures, the urethrovesical angle was elevated using the  vesicovaginal fascia approximating it in the midline.  The cystocele was  similarly reduced with the same tissue in interrupted Monocryl.  Segments of  mucosa were excised from the anterior vaginal wall.  The incision was closed  with running 2-0 Monocryl suture.  Attention was then turned posteriorly.  The fourchette was grasped on each side with an Allis clamp.  A pyramidal  shaped segment of skin was excised from the perineal body.  This was being  done to lengthen the perineal body and repair the cystocele posteriorly.  The mucosa overlying the rectum was tented, separated from the rectum and  perirectal tissues with careful blunt and sharp dissection and an incision  made for approximately 6-7 cm along the posterior vaginal mucosa in the  midline.  With careful blunt and sharp dissection, the rectum and perirectal  tissues were dissected away.  Please note that uterosacrals were plicated  during the anterior repair and were easily identified.  The sacrospinous  ligament was then carefully palpated and identified.  Using the Capio  device, 0 Dexon suture was placed into the ligament in the midportion.  This  was anchored to the vaginal mucosa anteriorly.  The posterior repair was  then performed using interrupted 0 Monocryl sutures to plicate the  perirectal tissue and recreate the paravaginal septum.  Levators were  approximated lengthening the perineal body.  Small segments of mucosa were  excised posteriorly and this incision closed partially along the vaginal  surface with a 2-0 Monocryl suture.  The sacrospinous ligament suture was  then tied adequately elevating the vaginal vault and fixating it to the  sacrospinous ligament.  The repair was then completed in a fashion similar  to episiotomy with a running suture of 2-0 Monocryl.  The bladder was filled   with approximately 300 mL of irrigating solution and a Bonanno suprapubic  catheter placed without difficulty, anchored to the skin overlying the mons  with nylon sutures.  2 inch Iodoform gauze pack was placed.  The patient was  then awakened and taken to the recovery room in good condition.  Estimated  blood loss was 300 mL.      Freddy Finner, M.D.  Electronically Signed     WRN/MEDQ  D:  04/24/2006  T:  04/24/2006  Job:  [id number]   cc:   Charlton Haws, M.D.  [address]  [address]  [place]  [place] [address]   Lonzo Cloud. Kriste Basque, M.D. [facility]  [address]  [place]  [place] [address]

## 2011-04-13 NOTE — Assessment & Plan Note (Signed)
[facility]                              CARDIOLOGY OFFICE NOTE   GRETNA, BERGIN                       MRN:          [id number]  DATE:10/03/2006                            DOB:          1929/10/07    Mossie is seen today in followup.  She has had some episodes of weakness and  low blood pressure at home.  We have had a hard time keeping her potassium  up.  I am not sure why she has been wasting it.  She has chronic AFib.  The  research people were here today to see if she would like to enroll in  [place].  Her rate seemed to be fine.  I told her for the time being we would  cut her Lasix and potassium in half and cut her Accupril in half to see if  her blood pressure is better at home.  She denies any significant chest  pain, PND, or orthopnea.  She is very good about checking her weight at home  and I trust if she were to have increasing edema, she would increase her  Lasix.   REVIEW OF SYSTEMS:  Otherwise, benign.  She is working 4 days a week at  [employer].   MEDICATIONS:  Listed in the chart.   EXAM:  She looks well.  She is not pale.  She has checked her stool guaiacs  and they were negative.  Blood pressure is currently 118/70.  She is in a fib at a rate of 70.  HEENT:  Normal.  There is no thyromegaly.  There is no lymphadenopathy.  There is no carotid  bruit.  LUNGS:  Clear.  There is an S1, S2 with a soft systolic murmur.  ABDOMEN:  Benign.  LOWER EXTREMITIES:  Intact pulses.  No edema.   IMPRESSION:  Stable chronic atrial fibrillation.  Good rate control.  Slightly low blood pressure recently, not sure why.  Cutting back on Lasix,  potassium, and Accupril.  Followup in about 4 weeks.  The people from [place]  research trial to see her in regards to a Coumadin alternative for chronic  atrial fibrillation.    ______________________________  [name] [name], MD, [facility]    PCN/MedQ  DD: 10/03/2006  DT: 10/03/2006  Job #: [id number]

## 2011-04-13 NOTE — Op Note (Signed)
NAMEGRETCHEN, [name]                ACCOUNT NO.:  [id number]   MEDICAL RECORD NO.:  [id number]          PATIENT TYPE:  AMB   LOCATION:  DSC                          FACILITY:  [facility]   PHYSICIAN:  Katy Fitch. Sypher, M.D. DATE OF BIRTH:  10-30-1929   DATE OF PROCEDURE:  03/06/2007  DATE OF DISCHARGE:                               OPERATIVE REPORT   PREOPERATIVE DIAGNOSIS:  Chronic left carpal tunnel syndrome.   POSTOPERATIVE DIAGNOSIS:  Chronic left carpal tunnel syndrome.   OPERATION:  Release of left transverse carpal ligament.   OPERATIONS:  Josephine Igo, M.D.   ASSISTANT:  Annye Rusk, P.A.-C.   ANESTHESIA:  General by LMA; supervising anesthesiologist is Dr.  Sampson Goon.   INDICATIONS:  Ms. Petrasek is a very pleasant 77 year old woman with a  history of atrial fibrillation on chronic Coumadin anticoagulation.  She  was referred by Dr. Kriste Basque for evaluation of bilateral hand discomfort  and numbness.  She is status post right carpal tunnel release.  After a  period of observation of her left hand, she decided to return at this  time for release of her left transverse carpal ligament.   Preoperatively, she was advised to hold her Coumadin with the consent of  her cardiologist, Dr. [name], for 5 days.  We checked Ms. Houchen's  prothrombin time and INR on March 05, 2007 and noted that it was 1.2.  She is now a proper candidate for release of the left transverse carpal  ligament.   PROCEDURE:  Daizee Firmin was brought to the operating room and placed in  a supine position on the operating table.   Following the induction of general anesthesia by LMA technique, the left  arm was prepped with Betadine soap solution and sterilely draped.  A  pneumatic tourniquet was applied to the proximal left brachium.   Following exsanguination of left arm with an Esmarch bandage, the  arterial tourniquet was inflated to 220 mmHg.  The procedure commenced  with a short incision in the line of  the ring finger and the palm.  Subcutaneous tissues were carefully divided, revealing the palmar  fascia.  This was split longitudinally to reveal the common sensory  branch of the median nerve.   These were followed back to transverse carpal ligament and the median  nerve proper.  The median nerve was gently separated from the transverse  carpal ligament utilizing a Insurance risk surveyor.  The transverse carpal  ligament and volar forearm fascia were then released subcutaneously  extending into the distal forearm.   This widely opened the carpal canal.  The tenosynovium of the ulnar  bursa was noted be fibrotic.  There were no other masses or predicaments  noted.   The wound was inspected for bleeding points and subsequently repaired  with intradermal 3-0 Prolene suture.   A compressive dressing was applied with a volar plaster splint,  maintaining the wrist in 5 degrees of dorsiflexion.   For aftercare, Ms. Divelbiss is provided a prescription for Dilaudid 2 mg 1  tablet p.o. q4-6h. p.r.n. pain 16 tablets without refill.  She is  also  advised to resume use of Tylenol.  She will restart her Coumadin today  and will return to see Dr. [name] or Dr. Kriste Basque for a prothrombin time  and INR within 5 days.      Katy Fitch Sypher, M.D.  Electronically Signed     RVS/MEDQ  D:  03/06/2007  T:  03/06/2007  Job:  [id number]   cc:   [name]. [name], MD, [facility]  Scott M. Kriste Basque, MD

## 2011-04-13 NOTE — Consult Note (Signed)
[name], [name]                            ACCOUNT NO.:  [id number]   MEDICAL RECORD NO.:  [id number]                   PATIENT TYPE:  INP   LOCATION:  3730                                 FACILITY:  [facility]   PHYSICIAN:  Jesse Sans. Wall, M.D.                DATE OF BIRTH:  Nov 06, 1929   DATE OF CONSULTATION:  DATE OF DISCHARGE:                                   CONSULTATION   REASON FOR CONSULTATION:  Chronic atrial fibrillation with pauses up to 2.8  seconds while the patient is being hospitalized for pneumonia.   HISTORY OF PRESENT ILLNESS:  A 77 year old female with hypertension and  hyperlipidemia, echocardiographic finding consistent with mild rheumatic  heart disease (aortic calcification with mild aortic insufficiency, mild  mitral stenosis, ejection fraction normal, left atrial enlargement) and  chronic atrial fibrillation (minimally to asymptomatic) diagnosed in 1992  status post failed DC cardioversion at that time.  Since the early 90s, the  patient has been maintained on Coumadin without significant complaint  regarding her atrial fibrillation.  The patient continues to remain active  saying that the atrial fibrillation does not seem to bother her.  I just  keep going.  The patient denies any dizziness.  She has never had any  history of syncope.  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  The patient denies chest pain, shortness of breath, or dyspnea on  exertion with any of these activities.  The patient has chronically been  maintained on digoxin for rate control along with 240 mg of Tiazac p.o.  daily.  The patient was admitted on November 26th with pneumonia and  temperature of 102 being treated currently with steroids and antibiotics  with significant improvement.  On telemetry, the patient has been notable  for pauses up to 2.8 seconds many which appear to be  occurring in the middle  of the night while the patient is sleeping although they are not clearly  post tachycardic in nature.  The patient denies any symptoms of dizziness or  lightheadedness during this hospitalization.  She reports never having  trouble with slow heart rates in the past that she is aware of.  The patient  denies ever being spoken to about the possibility of a pacemaker or  impending pacemaker in her near future.  The patient for the most part is  without complaint stating that she is looking forward to going home within  the next one to two days.   PAST MEDICAL HISTORY:  1. Echocardiographic findings consistent with a history of mild rheumatic     heart disease (mild mitral stenosis, mild aortic insufficiency with     aortic calcification, normal ejection fraction and left atrial     enlargement) __________ function maintained on Lasix 40 mg p.o. daily.  2.  Hyperlipidemia.  3. Hypertension.  4. Chronic atrial fibrillation since 1992 maintained on chronic Coumadin as     described above.  5. History of colon cancer status post colectomy in 1994.  6. History of uterine cancer in 1991 status post hysterectomy.  7. Hypothyroidism.  8. Degenerative joint disease.  9. History of shingles.  10.      History of venous stasis changes in lower extremity.   SOCIAL HISTORY:  No tobacco, rare alcohol.  Actively employed as described  in HPI.   FAMILY HISTORY:  Noncontributory.   REVIEW OF SYSTEMS:  Related to the patient's pneumonia and per HPI,  otherwise negative.  The patient has no recent changes in auditory or visual  acuity.  No alteration to bowel or bladder habits.  No weight gain or weight  loss.  Again, fevers, chills, sweats, cough consistent with pneumonia.   MEDICATIONS:  1. Digoxin 0.25 mg p.o. daily.  2. Synthroid 125 mcg p.o. daily.  3. Zocor 40 mg p.o. daily.  4. Diltiazem 240 mg p.o. daily being changed to diltiazem short-acting 30 mg     p.o. q.6  h.  5. Lisinopril 20 mg p.o. daily.  6. Lasix 40 mg p.o. daily.  7. Guaifenesin 1.2 grams p.o. b.i.d.  8. Pantoprazole daily.  9. Coumadin daily.  10.      Solu-Medrol 40 mg IV daily.  11.      Ceftriaxone 2 grams IV q.24 h.  12.      Azithromycin 500 mg IV q.24 h.  13.      Ambien 10 mg p.o. q.h.s. p.r.n.  14.      PRNs.   ALLERGIES/ADVERSE REACTIONS:  The patient reports CODEINE causes her to have  nausea.   PHYSICAL EXAMINATION:  INS AND OUTS:  975 in, 2.95 liters out.  VITAL SIGNS:  Blood pressures ranged from 112 to 155 systolic and 68 to 95  diastolic, respiratory rate 20, heart rate has averaged 70 to 100,  temperature is 36.7.  Oxygen saturation is 96% on room air.  GENERAL:  The patient is alert and appropriate.  She is pleasant and  cooperative.  HEENT:  She is normocephalic, atraumatic.  Extraocular movements are intact.  NECK:  Supple.  There is no significant jugular venous distention.  Carotid  pulses are 2+ bilaterally with no evidence of bruit.  PULMONARY:  Lung fields are mostly clear bilaterally without focal  consolidation identifiable on exam.  ABDOMEN:  Soft, nontender, nondistended.  Positive bowel sounds.  CARDIAC:  Irregularly irregular consistent with the patient's history of  atrial fibrillation.  There is a faint systolic murmur presumably related to  the chronic calcified aortic valve.  This does not, however, radiate to the  neck and S2 is preserved.  Diastolic murmurs are not readily audible.  EXTREMITIES:  Lower extremity examination reveals chronic venous stasis  changes with no clear evidence of edema.   LABORATORY AND ACCESSORY DATA:  BNP 166, AST 26, ALT 40, total bilirubin  0.5, alk phos 50.  Sodium 140, potassium 3.4, chloride 106, bicarb 28, BUN  16, creatinine 0.8, glucose 124.  White blood cell count 11 with 67% segs,  hematocrit 37, platelet count 306.  Total protein 6.1, albumin 3.1, calcium 8, INR 2.4.  TSH 0.354, ESR 52, digoxin 0.5,  CK-MB 117/1.1 and 233/2.1   Chest x-ray revealing bilateral airspace disease, mild cardiac enlargement,  left upper lobe pneumonia.   Echocardiogram October 22, 2003 reveals ejection fraction of 55-65%  with  aortic calcification and 1+ aortic insufficiency; rheumatic-appearing mitral  valve with 1 to 2+ mitral stenosis, mild; left atrial enlargement; 1 to 2+  tricuspid regurgitation.   ASSESSMENT AND PLAN:  Pneumonia with mild changes of history of rheumatic  heart disease on echocardiogram, normal ejection fraction, left atrial  enlargement, chronic atrial fibrillation well tolerated on chronic Coumadin  noted to have asymptomatic pauses up to 2.8 seconds occurring primarily at  night time.  TSH is normal.  Agree with recommendation to change diltiazem  from long-acting to a short-acting form while the patient is in the hospital  so that the dosage may be titrated to optimize the patient's rate control.  In light of the patient's normal ejection fraction, would discontinue  digoxin.  Would consider a Holter monitor upon discharge to confirm that the  patient's rate control at home is within an optimal range.  INR is  therapeutic and Coumadin is being continued.     Verne Grain, MD                      Jesse Sans Wall, M.D.    DDH/MEDQ  D:  10/24/2003  T:  10/24/2003  Job:  [id number]

## 2011-04-13 NOTE — Assessment & Plan Note (Signed)
[facility]                            CARDIOLOGY OFFICE NOTE   [name], [name]                       MRN:          [id number]  DATE:11/05/2006                            DOB:          [birth year]    [name] returns today for followup. She is in chronic A fib, her rate  control has been good. She has had good anticoagulation with Coumadin.   REVIEW OF SYSTEMS:  Remarkable for some significant vertigo or  dizziness. She has also had severe pain extending from both shoulders  down to her wrist, left greater than right. This usually occurs in the  morning after she has been sleeping. She gets a lot of residual tingling  in both hands.   There is no previous history of carpal tunnel.   I told [name] I was a little bit concerned about both the dizziness and  the left-sided predominance of the pain in the left arm and tingling. I  think she should have an MRI of the head and spine.   From a cardiac perspective she is otherwise stable.   She has not had any significant PND, orthopnea, or palpitations.   Her rate control has been excellent on Coreg.   MEDICATIONS:  1. Climara 0.1 mg once a week.  2. Synthroid 125 mcg a day.  3. Protegra daily.  4. Lipitor 20 a day.  5. Accupril 20 a day.  6. Coreg 3.125 b.i.d.  7. Gabapentin 300 a day.  8. Lasix 40 a day.  9. K-Dur 20 mEq 4 times a day.   PHYSICAL EXAMINATION:  VITAL SIGNS:  She is in A fib with a rate of 80.  HEENT:  Normal.  LUNGS:  Clear.  NECK:  Carotids are normal, there is no bruit, there is no thyromegaly,  there is no lymphadenopathy.  CARDIAC:  There is an S1, S2 with a slight murmur.  ABDOMEN:  Benign.  EXTREMITIES:  Lower extremities intact pulses, no edema. She has good  pulses in her arms. She has a negative Tinel's sign.   EKG shows A fib and nonspecific ST-T wave changes.   IMPRESSION:  Stable chronic atrial fibrillation with good rate control  on beta blocker. Continue  anticoagulation with Coumadin. Head and neck  MRI to rule out occult strokes and/or cervical spine problem causing her  significant pain lateralized to the left side. I will see her back in 3  months so long as her MRI is low risk.     [name]. [name], MD, [facility]  Electronically Signed    PCN/MedQ  DD: 11/05/2006  DT: 11/05/2006  Job #: [id number]

## 2011-04-13 NOTE — Op Note (Signed)
NAMEAPPLE, DEARMAS NO.:  [id number]   MEDICAL RECORD NO.:  [id number]          PATIENT TYPE:  INP   LOCATION:  3007                         FACILITY:  [facility]   PHYSICIAN:  Donalee Citrin, M.D.        DATE OF BIRTH:  05-08-1929   DATE OF PROCEDURE:  05/11/2005  DATE OF DISCHARGE:                                 OPERATIVE REPORT   PREOPERATIVE DIAGNOSIS:  Lumbar spinal stenosis L3-L4 with neurogenic  claudication and bilateral L4 radiculopathies.   POSTOPERATIVE DIAGNOSIS:  Lumbar spinal stenosis L3-L4 with neurogenic  claudication and bilateral L4 radiculopathies.   PROCEDURE:  Decompressive laminectomy bilateral L3-L4 with microscopic  foraminotomies of the L3 and L4 nerve roots bilaterally.   SURGEON:  Donalee Citrin, M.D.   ASSISTANT:  Reinaldo Meeker, M.D.   ANESTHESIA:  General endotracheal anesthesia.   HISTORY OF PRESENT ILLNESS:  The patient is a very pleasant 77 year old  female who has had long-standing back and bilateral leg pain radiating down  to her knees and in front of her shins and claudicates in about half a  block.  She had been through all forms of conservative treatment with anti-  inflammatories and physical therapy.  The pain has gotten progressively  worse.  Preoperative imaging from MRI back in December showed severe facet  arthropathy and spinal stenosis causing severe foraminal stenosis of L3 and  L4 roots.  Due to the patient's failure of conservative treatment and  preoperative imaging, the patient was recommended laminectomies bilaterally  at L3-L4 to decompress the thecal sac at that level.  The risks and benefits  of the surgery were discussed, the patient understood, and agreed to proceed  forth.   DESCRIPTION OF PROCEDURE:  The patient was brought to the operating room,  induced under general anesthesia, positioned prone on the Wilson frame.  The  back was prepped and draped in the usual sterile fashion.  After a preop x-  ray  localized the L3-L4 disc space and infiltrated with 10 mL of lidocaine  with epinephrine, a midline incision was made.  Bovie electrocautery was  used to dissect through the subcutaneous tissue and subperiosteal dissection  was carried out at the lamina of L3 and L4.  Interoperative x-ray confirmed  localization of the appropriate level.  Then, the spinous process of L3 was  removed and the inferior aspect of the lamina of L3 was removed in its  central canal.  This was noted to be markedly stenotic, the ligament was  noted to be markedly hypertrophied.  It was very difficult to gain access in  the central canal but after this was obtained and the thecal sac was  visualized, attention was turned to the bony decompression out laterally and  the under surface of the facet complex, the ligamentum flavum was noted to  be markedly hypertrophied, this was removed in a piecemeal fashion as well  as the superior aspect of the L4 lamina.  At this point, there was noted to  be a mass coming off the medial aspect of the facet complex displacing the  thecal sac to the left side, this was coming off the right L3-L4 facet, this  was felt to be consistent with a degenerative synovial cyst.  After the  remainder of the bony work had been done, the operating microscope was  draped and brought onto the field.  The plane underneath the cyst against  the thecal sac was developed with a #4 Penfield and the cyst was removed in  a piecemeal fashion with 2 and 3 mm Kerrison punch.  The cyst was remarkably  degenerated.  After the cyst removal, the thecal sac was noted to be  significantly decompressed.  Then, the L3 and L4 nerve roots were identified  with a coronary dilator revealed no further stenosis.  On the left side, the  under surface of the gutter was under bitten and the ligament was removed in  a piecemeal fashion decompressing the 3 and 4 roots on the left.  At the end  of the decompression, there was no  further stenosis of the thecal sac or the  3 and 4 roots on both sides.  The wound was copiously irrigated and  meticulous hemostasis was maintained.  Gelfoam was overlaid on top of the  dura.  The muscle and fascia were reapproximated with 0 interrupted Vicryl,  the subcutaneous tissue was closed with 2-0 interrupted Vicryl, and skin was  closed with running 4-0 subcuticular.  Benzoin and Steri-Strips were  applied.  The patient went to the recovery room in stable condition.  At the  end of the case, the needle, sponge, and instrument counts were correct.       GC/MEDQ  D:  05/11/2005  T:  05/11/2005  Job:  [id number]

## 2011-04-13 NOTE — Discharge Summary (Signed)
[name], [name] NO.:  [id number]   MEDICAL RECORD NO.:  [id number]          PATIENT TYPE:  INP   LOCATION:  9305                          FACILITY:  [facility]   PHYSICIAN:  [name], M.D.   DATE OF BIRTH:  Dec 12, 1928   DATE OF ADMISSION:  04/24/2006  DATE OF DISCHARGE:  04/26/2006                                 DISCHARGE SUMMARY   DISCHARGE DIAGNOSES:  Cystocele with prolapse, vaginal vault prolapse, first-  degree rectocele, all with marked recent progression in symptoms and  severity.   MEDICAL DIAGNOSIS:  Atrial fibrillation for which she is managed by Dr.  Eden [name].   OPERATIVE PROCEDURE:  Anterior and posterior vaginal repair, sacrospinous  ligament suspension of vagina.   INTRAOPERATIVE COMPLICATIONS:  None.   POSTOPERATIVE COMPLICATIONS:  None other than malfunctioning suprapubic  catheter due to clotting of the catheter lumen requiring Foley catheter and  different approach to voiding trials.   DISPOSITION:  The patient was restarted on her Coumadin by Dr. Eden [name]  immediately after surgery. At the time of her discharge, she was having  adequate bowel and bladder function, she was tolerating a regular diet.  She  was ambulating without difficulty. She was discharged home with Darvocet and  with Vicodin to be taken as needed for level of severity postoperative pain.  She is advised to come in for 2-week postoperative follow-up.  She is to  avoid vaginal entry and heavy lifting.  She is to report fever, heavy  bleeding or urinary retention.   Details of the present illness, past history, family history, review of  systems and physical exam are recorded in the admission note.  Her physical  findings on admission were remarkable for her atrial fibrillation and  remarkable for her pelvic findings as described in the discharge diagnosis.   Laboratory data during this admission includes CBC on admission which was  normal with hemoglobin of 14,  postoperative hemoglobin was 11.0 on the first  postoperative day, at 11.0 second postoperative day. INRs were followed  serially through her admission and were always in the normal range from 1.1-  1.3. Urine analysis on admission was normal. Chemistry profile was normal  except for a slight elevation in total bilirubin at 1.6.   HOSPITAL COURSE:  The patient was admitted on the morning of surgery, she  was treated perioperatively with IV antibiotics and with sequential  compression hose for deep vein thrombosis prophylaxis.  The above-described  operative procedure was accomplished without intraoperative difficulty.  Postoperatively her suprapubic catheter  malfunctioned.  This required [place] to use a Foley catheter and serial  ultrasounds for measuring voided residual volumes.  This was adequately  resolved and by the second postoperative day her condition was good.  She  was discharged home with disposition as noted above.      [name], M.D.  Electronically Signed     WRN/MEDQ  D:  05/17/2006  T:  05/17/2006  Job:  [id number]   cc:   [name], M.D.  [address]  [address]  [address]  [place] [address]

## 2011-04-13 NOTE — Discharge Summary (Signed)
NAMECHARLEY, [name]                            ACCOUNT NO.:  [id number]   MEDICAL RECORD NO.:  [id number]                   PATIENT TYPE:  INP   LOCATION:  3730                                 FACILITY:  [facility]   PHYSICIAN:  Lonzo Cloud. Kriste Basque, M.D. [facility]            DATE OF BIRTH:  15-Jan-1929   DATE OF ADMISSION:  10/20/2003  DATE OF DISCHARGE:  10/26/2003                                 DISCHARGE SUMMARY   FINAL DIAGNOSES:  1. Admitted October 20, 2003 via the emergency room with bilateral     pneumonia - no organism specified.  Patient responded to broad-spectrum     antibiotic coverage with Rocephin and Zithromax plus a brief course of IV     Solu-Medrol.  There was slow radiographic improvement and outpatient     followup is planned.  2. Atrial fibrillation with slow ventricular response - medications adjusted     this admission.  3. Hypertension - controlled on medication.  4. Hypercholesterolemia - controlled on medication.  5. History of colon cancer 1994 with no known recurrence.  6. Hypothyroidism - controlled on Synthroid.  7. Status post hysterectomy.  8. History of degenerative arthritis.  9. History of shingles in the past.  10.      History of basal cell carcinoma with previous Mohs therapy.   BRIEF HISTORY AND PHYSICAL:  The patient is a 77 year old white female known  to me with problems as noted above.  She presented to the emergency room on  the day of admission with temperature to 102 degrees, chills and sweats.  She had a cough, chest congestion and thick yellow sputum production.  She  had some mild chest discomfort with aching and soreness.  She had very poor  energy over the last several weeks and felt she could not make it.  In the  emergency room she was evaluated by Dr. Lynelle Doctor of the ER staff and found to  have bilateral infiltrates on her chest x-ray, atrial fibrillation with a  rapid ventricular response and was referred for admission.   PAST MEDICAL  HISTORY:  She had a history of atrial fibrillation being  followed by Dr. [name].  She had a Cardiolite in 2003 which was negative and  a 2-D echo in 2001 that showed a moderately dilated left atrium and right  atrium and a rheumatic-looking mitral valve with mild MS and MR.  She has a  history of hypertension controlled on medications and hypercholesterolemia  controlled on Lipitor.  Her last cholesterol was 153 with an HDL of 37 and  LDL of 82.  Triglycerides were 171.  She had a history of colon cancer in  1994 and is followed by Dr. Marina Goodell.  She had a colonoscopy in followup in  March 2003 that showed her previous right hemicolectomy and hemorrhoids but  no polyps and followup recommended in 3 years.  She has had previous  hysterectomy.  She has hypothyroidism controlled on Synthroid.  She has  history of degenerative arthritis with some right shoulder pain and has been  evaluated by Dr. [name].  She has a history of shingles in the past.  She  has had previous basal cell carcinoma on her right cheek with Mohs therapy  in 2003.  She is on chronic Coumadin and followed in the Coumadin clinic.   PHYSICAL EXAMINATION:  Physical examination at time of admission revealed a  77 year old white female in no acute distress.  Blood pressure 142/74, pulse  100 and irregular, respirations 22 per minute and not labored, O2 saturation  94% on room air and temperature was 102 in the ER.  HEENT exam revealed  slightly dry mucous membranes otherwise negative.  Neck exam showed no  jugular venous distention, no carotid bruits, no thyromegaly or  lymphadenopathy.  Chest exam revealed diffuse scattered rhonchi bilaterally  but no signs of consolidation and no rales heard.  Cardiac exam revealed  irregular rhythm grade 1/6 systolic ejection murmur left sternal border, no  rubs or gallops heard.  The abdomen was soft with minimal epigastric  tenderness on palpation, no evidence of organomegaly or masses.   Evidence of  previous colon surgery.  Extremities showed no cyanosis, clubbing, or edema.  Neurologic exam was intact without focal abnormalities detected.   LABORATORY DATA:  EKG showed atrial fibrillation and nonspecific ST-T wave  changes.  A 2-D echo was followed up this admission and showed overall  normal left ventricular systolic function with ejection fraction of 55-65%  range; the aortic valve was mildly calcified with 1+ AR; mild rheumatic  deformity of the mitral valve involving the anterior leaflet with mild  restriction of leaflet motion and some mild portal involvement; compatible  with mild mitral stenosis graded 1 to 2+.  The left atrium was dilated with  1 to 2+ mitral regurgitation.  Chest x-ray showed bilateral infiltrates  which were slow to clear but followup films showed significant improvement  in the patchy airspace opacities bilaterally.  Hemoglobin 12.5, hematocrit  36.3, white count 11,300 with 79% segs.  Sed rate 52.  Prothrombin time  22.1, INR 2.6.  Serial prothrombin times were followed throughout her  hospital course.  Sodium 138, potassium 3.5, chloride 107, CO2 24, BUN 10,  creatinine 0.8, blood sugar 118, calcium 8.1, total protein 6.9, albumin  3.6, AST 31, ALT 20, alk phosphorus 63, total bilirubin 0.7.  CPK 17,  negative MB, negative troponin.  TSH 0.35.  Digoxin level 0.5.  Urinalysis  showed some epithelial cells and bacteria.  Blood cultures negative x2.  No  sputum obtained for culture.   HOSPITAL COURSE:  The patient was admitted with an atypical pneumonia and  started on Rocephin and Zithromax.  She had a very slow clinical response,  required guaifenesin for a mucolytic agent and some Tussionex for cough but  eventually had improvement in her chest x-ray on the IV antibiotics.  She  was felt to be at [facility] and ready for discharge on October 26, 2003 and discharged on a Z-Pak.  Followup chest x-ray planned in the office.   She was on the  cardiac monitor this admission and had a followup 2-D echo  with results noted above.  She developed bradyarrhythmia with a 2.3 second  pause and cardiology was consulted.  She was seen by Dr. [name] who  recommended discontinuing of her digoxin and continued monitoring.  Her rate  improved and she  was ambulating well.  Prothrombin times were followed by  the pharmacy Coumadin protocol.   MEDICATIONS AT DISCHARGE:  1. Z-Pak as directed.  2. Coumadin 5 mg tablets, she was previously on 6 mg daily with 8 mg on     Monday.  She is being discharged on 5 mg a day with a prothrombin time     followup in the office.  3. Lasix 40 mg p.o. once daily.  4. K-Dur 20 one tablet p.o. once daily.  5. Dilacor XR 240 one tablet a day.  6. Accupril 20 mg p.o. once daily.  7. Synthroid 0.125 mg p.o. once daily.  8. Lipitor 20 mg p.o. once daily.  9. Tylenol as needed.  10.      She will resume her Climara, calcium supplements, and vitamins.  11.      She was instructed to stop her Lanoxin.   CONDITION AT DISCHARGE:  Improved.   FOLLOW UP:  The patient will follow up in the office with me on Monday,  November 01, 2003 at 12 p.m. with a chest x-ray.                                                Lonzo Cloud. Kriste Basque, M.D. [employer]   SMN/MEDQ  D:  10/26/2003  T:  10/26/2003  Job:  [id number]

## 2011-04-13 NOTE — Assessment & Plan Note (Signed)
[facility]                              CARDIOLOGY OFFICE NOTE   [name], [name]                       MRN:          [id number]  DATE:07/26/2006                            DOB:          [birth year]    HISTORY OF PRESENT ILLNESS:  [name] returns today for followup.  She is  doing well.  Her atrial fibrillation seems well controlled.  She does not  seem to miss the Dilacor.  We had stopped this due to lower extremity edema  and placed her on Coreg.  She has been taking the Zaroxolyn along with  b.i.d. Lasix.   She has not had any PND or orthopnea.   We will also to check her BNP and B-MET today.   Her anticoagulation has been good.   PHYSICAL EXAMINATION:  GENERAL:  She is in atrial fibrillation at a rate of  70.  LUNGS:  Clear.  NECK:  Carotids normal.  CARDIAC:  S1, S1 with a systolic murmur.  ABDOMEN:  Benign.  EXTREMITIES:  Lower extremities have trace edema which is much improved.   IMPRESSION:  1. Atrial fibrillation, chronic with good rate control, now on beta      blocker, stopping calcium blockers due to lower extremity edema.      Continue Lasix and Zaroxolyn.  If she is significantly prerenal we will      have to back off on this.  However, currently she seems fairly      euvolemic.  2. The patient would like a note to work 7-1/2 hours four days a week.  We      gave her a note for this.  3. I will see her back in about 8 weeks.                               Chloe Pick. [name], MD, [facility]    PCN/MedQ  DD:  07/26/2006  DT:  07/27/2006  Job #:  [id number]

## 2011-04-13 NOTE — Assessment & Plan Note (Signed)
[facility]                                   ON-CALL NOTE   [name], [name]                       MRN:          [id number]  DATE:  07/26/2006                              DOB:      September 13, 1929    PRIMARY PHYSICIAN:  [name]. [name], MD   PRIMARY CARE PHYSICIAN:  [name]. [name], MD   SUMMARY:  On July 26, 2006, at approximately 6:30, I received a phone call  from [place] of [employer].  She stated that she had a critical value to  report on [name].  Her potassium was 2.5, BUN was 23 and creatinine  0.8.  After receiving [name]'s phone number I called her residence, and  there was no answer.  I was able to leave a message on the answering machine  asking [name] to call me back when she returned home.   [name] returned my phone call at approximately 9:30 p.m. on Friday  evening.  I informed her of her low potassium and reviewed her medication.  I asked her to supplement her potassium.  She would take 40 mEq of potassium  when she got off the phone with me, 40 mEq prior to her going to bed on  Friday evening.  She would take 40 mEq of potassium twice on Saturday and on  Sunday begin 40 mEq of potassium daily.  I asked her to contact our office  on Tuesday morning to arrange a follow-up BMET for Tuesday or Wednesday to  reevaluate her potassium.  She was agreeable with this.   I have already spoken with the office.  They will call her at home to  arrange a BMET to follow up on her low potassium.                                   Chloe Rued, PA-C                                [name]. [name], MD, [facility]   EW/MedQ  DD:  07/30/2006  DT:  07/30/2006  Job #:  [id number]

## 2011-04-13 NOTE — Discharge Summary (Signed)
NAMECHRISSY, EALEY NO.:  [id number]   MEDICAL RECORD NO.:  [id number]          PATIENT TYPE:  INP   LOCATION:  3007                         FACILITY:  [facility]   PHYSICIAN:  Donalee Citrin, M.D.        DATE OF BIRTH:  06-24-1929   DATE OF ADMISSION:  05/11/2005  DATE OF DISCHARGE:  05/15/2005                                 DISCHARGE SUMMARY   ADMITTING DIAGNOSES:  Synovial cyst L3-4.   PROCEDURE:  Right-sided lumbar laminectomy for resection of synovial cyst.   The patient is a 77 year old female who was admitted ___________ to the  hospital.  Underwent the aforementioned procedure.  Postoperative patient  did very well recovering on the floor.  Patient had complete improvement of  her leg pain.  However, had significant muscle spasm in her back and was  slow to mobilize.  Over the next couple days she progressively mobilized.  Patient came under better control and she was able to be discharged home on  hospital day four with home health physical therapy, commode seat, and  rolling walker.  Was scheduled to follow up in two weeks.  At the time of  discharge she was neurologically stable with complete resolution of  preoperative leg pain.           ______________________________  Donalee Citrin, M.D.     GC/MEDQ  D:  08/10/2005  T:  08/10/2005  Job:  [id number]

## 2011-04-13 NOTE — Assessment & Plan Note (Signed)
[facility]                              CARDIOLOGY OFFICE NOTE   [name], [name]                       MRN:          [id number]  DATE:06/24/2006                            DOB:          [birth year]    [name] was seen today in followup.  She continues to have lower extremity  edema.  I am not sure of the etiology.  Her diet has been stable.  Her TSH  is normal.  She has been taking 40 Lasix once a day.  She has been taking  extra dosages three or four times a week.  She has not weighed herself but  feels that her weight is up and that the edema is not completely gone in the  morning.   She does not have a history of DVT or venous insufficiency.  She has had a  history of colon cancer but h as not had any change in her bowel habits or  abdominal bloating.   I told her that since she is in chronic afibrillation she needs an AV no  blocking drug.  I told her that since calcium blockers can retain fluid we  would stop her Dilacor and increase her Coreg to 12.5 b.i.d.   We will give her Lasix 40 b.i.d. to take on a regular basis.  She does take  20 Potassium a day.  I also gave her an as needed prescription for Zaroxolyn  to take 30 minutes prior to her morning dose of Lasix.  She will weigh  herself daily.  I will see her back in four weeks at which time we will  probably check a BMP and a BMET.   EXAMINATION:  GENERAL:  She looks well.  VITAL SIGNS:  She is in a-fib at a rate of 88.  Blood pressure is 130/70.  LUNGS:  Clear.  NECK:  Carotids are normal.  HEART:  There is an S1, S2.  Normal heart sounds.  ABDOMEN:  Benign.  EXTREMITIES:  There is a +1 lower extremity edema bilaterally.   IMPRESSION:  1.  Chronic atrial fibrillation.  2.  Good anticoagulation.  3.  Rate control to be adjusted.  4.  Stopping Dilacor for lower extremity edema.  5.  Increase Lasix.   I told the patient that if these maneuvers did not help we may need to  do  further diagnostic testing including 24-hour urine protein, lower extremity  Duplex and abdominal computerized tomography given her history of colon  cancer.   New prescriptions were given and I will see her back in about four weeks.                               [name]. [name], MD, [facility]    PCN/MedQ  DD:  06/24/2006  DT:  06/25/2006  Job #:  [id number]

## 2011-04-13 NOTE — H&P (Signed)
NAMEJAZ, LANINGHAM NO.:  [id number]   MEDICAL RECORD NO.:  [id number]          PATIENT TYPE:  INP   LOCATION:  NA                            FACILITY:  [facility]   PHYSICIAN:  Freddy Finner, M.D.   DATE OF BIRTH:  Jan 19, 1929   DATE OF ADMISSION:  DATE OF DISCHARGE:                                HISTORY & PHYSICAL   ADMISSION DIAGNOSES:  Cystocele with prolapse, vaginal vault prolapse with  first-degree rectocele.  Marked recent progression and symptoms.   Patient is a 77 year old white married female, nulligravida, who had a total  abdominal hyste[place] in another city in [place] for a grade I, stage I  endometrial carcinoma.  To date, she has had no evidence of recurrence and  at her initial presentation in my office in 1994, she had terrible symptoms  of menopause and for that reason had been started on Estraderm 0.05 mg  transdermal patch.  She has continued on transdermal hormonal therapy since  that time and at the present time is on Climara 0.1 mg patch once a week.  Patient has also had longstanding atrial fibrillation for which she sees Dr.  [name], who has cleared her for surgery at the present time and is  monitoring her Coumadin therapy.  She actually discontinued this  approximately one week prior to this procedure, and her coagulation times  have been documented in his office to be normal.  He does not feel the need,  based on our conversation to prophylax her during the acute phase of the  surgery and will restart her medication after surgery.  Patient is also  known to be hypothyroid, for which she takes Synthroid.   Her present illness really dramatically changed in the recent past with  prolapse of her bladder and vagina, which she finds very uncomfortable and  significantly adverse to her quality of life.  Options of therapy were  discussed with her in the office, and she is now admitted for anterior and  posterior colporrhaphy with sacral spinous  ligament suspension of the  vagina.   Her current review of systems at the present time is negative except for her  GYN complaints.  There are no specific cardiopulmonary, GI, or other GU  complaints.   Known medical conditions include atrial fibrillation.  She takes Coumadin  for this.  She takes additional medications, including Coreg 3.125 mg a day.  She takes Dilacor 120 mg a day.  She takes Accupril 40 mg a day.  She takes  Lipitor 20 mg a day for hypercholesterolemia.  She takes 20 mEq of potassium  chloride a day for replacement.   Her previous surgical history includes the TAH, as noted above.  She also  had a back surgery approximately in 1984 and has had injections for back  pain.   Her only known allergy to medications is to CODEINE.   FAMILY HISTORY:  Remarkable for a sister with adult-onset insulin-dependent  diabetes.  Her mother had a melanoma and died at the age of 38.  She had a  brother with lung  cancer, sister with hypertension, a sister with a stroke.  The patient is further known to have mitral valve prolapse and takes  antibiotics for dental procedures.   PHYSICAL EXAMINATION:  VITAL SIGNS:  Most recent blood pressure in my office  was 118/80.  Her weight is 212-1/2.  GENERAL:  The patient is a well-developed, well-nourished elderly white  female in no acute distress.  She is articulate, alert and oriented.  HEENT:  Grossly within normal limits.  NECK:  I cannot appreciate enlargement of the thyroid to palpation.  CHEST:  Clear to auscultation throughout.  HEART:  Irregular rhythm.  Intermittent, very early systolic murmur, grade  1/6.  No other audible murmurs or rubs.  BREASTS:  Normal.  There are no palpable masses.  No nipple discharge.  No  evidence of skin change (the most recent mammogram performed is November 22, 2005, which was normal).  ABDOMEN:  Soft and nontender without appreciable organomegaly.  EXTREMITIES:  Without clubbing, cyanosis or  edema.  PELVIC:  Relaxation of the vaginal outlet to examination of the external  genitalia.  She also has pale, thin skin consistent with lichen sclerosus  atrophicus.  The cystocele prolapse is through the introitus with Valsalva.  A first-degree rectocele is palpable on bimanual and rectovaginal  examination.  There are no palpable rectal masses.  No palpable adnexal  masses.   ASSESSMENT:  Cystocele with prolapse, rectocele, vaginal vault prolapse.   PLAN:  Anterior and posterior colporrhaphy, sacral spinous ligament  suspension of the vagina, antibiotic prophylaxis perioperatively.  Consultation for further management of her atrial fibrillation and medical  issues with Dr. [name] and/or Dr. Kriste Basque.      Freddy Finner, M.D.  Electronically Signed     WRN/MEDQ  D:  04/23/2006  T:  04/23/2006  Job:  [id number]   cc:   Charlton Haws, M.D.  [address]  [address]  [place]  [place] [address]   Lonzo Cloud. Kriste Basque, M.D. [facility]  [address]  [place]  [place] [address]

## 2011-04-16 ENCOUNTER — Other Ambulatory Visit (INDEPENDENT_AMBULATORY_CARE_PROVIDER_SITE_OTHER): Payer: Medicare Other

## 2011-04-16 ENCOUNTER — Ambulatory Visit (INDEPENDENT_AMBULATORY_CARE_PROVIDER_SITE_OTHER): Payer: Medicare Other | Admitting: Pulmonary Disease

## 2011-04-16 ENCOUNTER — Ambulatory Visit (INDEPENDENT_AMBULATORY_CARE_PROVIDER_SITE_OTHER)
Admission: RE | Admit: 2011-04-16 | Discharge: 2011-04-16 | Disposition: A | Discharge status: Home / Self Care | Payer: Medicare Other | Source: Ambulatory Visit | Attending: Pulmonary Disease | Admitting: Pulmonary Disease

## 2011-04-16 ENCOUNTER — Encounter: Payer: Self-pay | Admitting: Pulmonary Disease

## 2011-04-16 DIAGNOSIS — C189 Malignant neoplasm of colon, unspecified: Secondary | ICD-10-CM

## 2011-04-16 DIAGNOSIS — I739 Peripheral vascular disease, unspecified: Secondary | ICD-10-CM

## 2011-04-16 DIAGNOSIS — I4891 Unspecified atrial fibrillation: Secondary | ICD-10-CM

## 2011-04-16 DIAGNOSIS — G609 Hereditary and idiopathic neuropathy, unspecified: Secondary | ICD-10-CM

## 2011-04-16 DIAGNOSIS — E039 Hypothyroidism, unspecified: Secondary | ICD-10-CM

## 2011-04-16 DIAGNOSIS — R7303 Prediabetes: Secondary | ICD-10-CM

## 2011-04-16 DIAGNOSIS — E739 Lactose intolerance, unspecified: Secondary | ICD-10-CM

## 2011-04-16 DIAGNOSIS — R7309 Other abnormal glucose: Secondary | ICD-10-CM

## 2011-04-16 DIAGNOSIS — M545 Low back pain, unspecified: Secondary | ICD-10-CM

## 2011-04-16 DIAGNOSIS — I059 Rheumatic mitral valve disease, unspecified: Secondary | ICD-10-CM

## 2011-04-16 DIAGNOSIS — E538 Deficiency of other specified B group vitamins: Secondary | ICD-10-CM

## 2011-04-16 DIAGNOSIS — M1990 Unspecified osteoarthritis, unspecified site: Secondary | ICD-10-CM

## 2011-04-16 DIAGNOSIS — M810 Age-related osteoporosis without current pathological fracture: Secondary | ICD-10-CM

## 2011-04-16 DIAGNOSIS — I10 Essential (primary) hypertension: Secondary | ICD-10-CM

## 2011-04-16 DIAGNOSIS — Z8542 Personal history of malignant neoplasm of other parts of uterus: Secondary | ICD-10-CM

## 2011-04-16 DIAGNOSIS — I872 Venous insufficiency (chronic) (peripheral): Secondary | ICD-10-CM

## 2011-04-16 DIAGNOSIS — F411 Generalized anxiety disorder: Secondary | ICD-10-CM

## 2011-04-16 DIAGNOSIS — E785 Hyperlipidemia, unspecified: Secondary | ICD-10-CM

## 2011-04-16 LAB — HEPATIC FUNCTION PANEL
ALT: 20 U/L (ref 0–35)
Alkaline Phosphatase: 64 U/L (ref 39–117)
Bilirubin, Direct: 0.2 mg/dL (ref 0.0–0.3)
Total Bilirubin: 1.1 mg/dL (ref 0.3–1.2)
Total Protein: 6.7 g/dL (ref 6.0–8.3)

## 2011-04-16 LAB — CBC WITH DIFFERENTIAL/PLATELET
Basophils Absolute: 0 10*3/uL (ref 0.0–0.1)
Basophils Relative: 0.4 % (ref 0.0–3.0)
Eosinophils Absolute: 0.2 10*3/uL (ref 0.0–0.7)
Hemoglobin: 14.4 g/dL (ref 12.0–15.0)
Lymphocytes Relative: 18.8 % (ref 12.0–46.0)
MCHC: 34 g/dL (ref 30.0–36.0)
MCV: 96.1 fl (ref 78.0–100.0)
Monocytes Absolute: 0.6 10*3/uL (ref 0.1–1.0)
Neutro Abs: 5.9 10*3/uL (ref 1.4–7.7)
Neutrophils Relative %: 70.5 % (ref 43.0–77.0)
RDW: 13.4 % (ref 11.5–14.6)

## 2011-04-16 LAB — BASIC METABOLIC PANEL
CO2: 28 mEq/L (ref 19–32)
Chloride: 102 mEq/L (ref 96–112)
Sodium: 137 mEq/L (ref 135–145)

## 2011-04-16 LAB — HEMOGLOBIN A1C: Hgb A1c MFr Bld: 6.6 % — ABNORMAL HIGH (ref 4.6–6.5)

## 2011-04-16 LAB — LIPID PANEL
LDL Cholesterol: 69 mg/dL (ref 0–99)
Total CHOL/HDL Ratio: 3

## 2011-04-16 NOTE — Patient Instructions (Signed)
Today we updated your med list in EPIC...    Continue your current meds the same...  Today we did your follow up CXR & fasting blood work...    Please call the PHONE TREE in a few days for your results...    Dial [phone] & when prompted enter your patient number followed by the # symbol...    Your patient number is:  [phone]#  Let's get back into our regular exercise program now that the hammertoe surg is finished...  Call for any questions...  Let's plan another follow up visit in 6 months, sooner if needed for problems.Marland KitchenMarland Kitchen

## 2011-04-16 NOTE — Progress Notes (Signed)
Subjective:    Patient ID: [name], female    DOB: [birth year], 77 y.o.   MRN: [id number]  HPI 77 y/o WF here for a follow up visit... she has multiple medical problems as listed below...   ~  Apr 17, 2010:  she is under increasing stress w/ husb's illness- prob pancreatic Ca, followed at [facility], he's lost >140#, etc... saw Walker Kehr for her HBP, edema, AFib on coumadin> stable, she takes extra Accupril if BP up, sched for Myoview soon...  she is due for f/u CXR (clear, NAD); fasting labs (OK- see below); & needs colonoscopy (sched w/ DrPerry for 05/02/10)...  ~  October 25, 2010:   her husb passed away June 25, 2010 then incr stress w/ his daughter, she has Therapist, sports for Prn use... she had abn Myoview 5/11, then cath 6/11 w/ essent norm coronaries, & 2DEcho showing norm LV, dilated LA & RV, AoV sclerosis & mild AI, MV thickening & mild MS & mod MR... she saw Walker Kehr 8/11- no changes made... also had colon 6/11 by DrPerry- divertics, 2 sm polyps, prev right hemicolectomy- path= tubular adenoma & f/u planned 45yrs.  ~  Apr 16, 2011:  32mo ROV & she is c/o lack of energy, feeling weak, nothing more specific; she had hammer toe surg (Art Dopplers LEs 2/12 per DrPetrinitz were normal) & hasn't been able to exercise- pin to be removed soon & asked to incr exerc program; she saw [facility] 12/11 for f/u HBP, AFib, edema, MV dis, Coumadin rx- stable, no changes made;  BP controlled on meds & labs show K=5.1 on Lasix, Aldactone & KCl- 4 daily (rec to decr to 2/d);  Chol is good on Lip20 but TG still sl incr & rec to get on a better low fat diet;  FBS=98 & A1c=6.6 on diet alone;  Other labs OK- see prob list below>>          Problem List:  HYPERTENSION (ICD-401.9) - controlled on COREG 3.125mg Bid, ACCUPRIL 20mg /d, LASIX 40mg /d, ALDACTONE 25mg /d & K20- 2tabsBid... BP= 124/72 today and similar at home unless stressed... denies HA, fatigue, visual changes, CP, palipit, dizziness, syncope, dyspnea, edema, etc... ~  labs  9/09 at Neuro showed K= 4.3, BUN= 15, Creat= 0.6 ~  labs 1/10 showed K= 4.8, BUN= 21, Creat= 0.6 ~  labs 5/10 showed K= 4.5, BUN= 17, Creat= 0.6 ~  labs 5/11 showed K= 4.8, BUN= 19, Creat= 0.7; CXR- cardiomeg, mild hyperinflation, clear, NAD. ~  Labs 5/12 showed K= 5.1, BUN= 21, Creat= 0.8; CXR- mild cardiomeg, clear, NAD; rec to decr K20 from 4/d to 2/d...  ATRIAL FIBRILLATION (ICD-427.31) - on Coumadin per cardiology & followed in the [facility]... MITRAL VALVE DISORDER (ICD-424.0) - followed by Walker Kehr & seen Q48mo... ? of PATENT FORAMEN OVALE (ICD-745.5) ~  NuclearStressTest 11/05 showed norm perfusion, mild apic thinning, no ischemia, EF=79%... ~  2DEcho 9/08 showed norm LVF, EF=55-60%, no regional wall motion abn, mod thickening of MV, mod MR, mod dil LA, ?PFO vs small ASD.Marland Kitchen. (prev echo's w/ mild MS)... ~  2DEcho 2/10 showed norm LVF w/o regional wall motion abn & EF= 55%, mild MS & mod MR (Rheumatic deformity), mod dil LA & RA, etc... ~  Myoview 5/11 showed  +ischemia & ST depression, EF= 77% ~  Cath 6/11 showed essent norm coronaries (min LAD plaque), dcalcif AoV, norm LVF, MR... ~  2DEcho 6/11 showed norm LVF, mild LVH, dil RV w/ mild decr RVF, dil LA, mild MV thickening w/ mild  MS & mod MR, AoV sclerosis & no stenosis.  PERIPHERAL VASCULAR DISEASE (ICD-443.9) - tortuous Ao on CXR...  ~  CTAngio Abd&Legs 7/08 showed mild to mod right RAS, no signif PVD, mild run-off disease in legs... ~  LE Art Dopplers 2/12 showed normal ABIs, normal toe indicies as well, biphasic & broadened PTA waveforms noted...  VENOUS INSUFFICIENCY (ICD-459.81) - on LASIX 40mg /d, ALDACTONE 25mg /d, + KCl - 2Bid... she follows a low sodium diet, elevates, support hose...  HYPERLIPIDEMIA (ICD-272.4) - on LIPITOR 20mg /d + diet Rx...  ~  FLP 5/08 showed TChol 100, TG 65, HDL 40, LDL 47 ~  FLP 3/09 showed TChol 114, TG 85, HDL 35, LDL 62 ~  FLP 9/09 at Neuro showed TChol 125, TG 118, HDL 34, LDL 67 ~  FLP  5/10 showed TChol 136, TG 127, HDL 36, LDL 74 ~  FLP 5/11 showed TChol 177, TG 167, HDL 51, LDL 93 ~  FLP 5/12 on Lip20 showed TChol 149, TG 168, HDL 47, LDL 69... rec same med, better low fat diet.  IMPAIRED GLUCOSE TOLERANCE (ICD-271.3) - on diet  alone> see eval by Sula Soda, Neurology... ~  GTT 9/09 showed FBS= 101, 1hr BS= 216, A1c= 6.4 ~  labs 5/10 showed BS= 97, A1c= 6.7 ~  labs 5/11 showed BS= 107, A1c= 6.5 ~  Labs 5/12 showed BS= 98, A1c= 6.6.Marland KitchenMarland Kitchen Continue diet Rx.  HYPOTHYROIDISM (ICD-244.9) - on SYNTHROID 150mcg/d... ~  TSH 6/08 was normal at 2.95 ~  TSH 3/09 was 2.18 ~  labs 9/09 at Neuro showed TSH= 0.62 ~  labs 5/10 showed TSH= 0.71 ~  labs 5/11 showed TSH= 0.31 ~  Labs 5/12 on Levothy125 showed TSH= 0.92... Continue same.  Hx of COLON CANCER (ICD-153.9) - s/p right hemicolectomy in 1994... no known recurrence. ~  f/u colonoscopy 05/02/10 w/ DrPerry showed divertics, 2 sm polyps= tubular adenoma, f/u planned 76yrs.  Hx of SPLENIC INFARCTION (ICD-289.59) - eval for left side pain 6/08 w/ CTAbd showing splenic infarct (she was on a coumadin alternative study drug) & switched to coumadin at that time...  UTERINE CANCER, HX OF (ICD-V10.42) - s/p hysterectomy, subseq AP repair 5/07 DrNeal... ~  She continues w/ annual GYN f/u DrNeal & she reports doing satis...  Hx of URINARY TRACT INFECTION (ICD-599.0)  OSTEOARTHRITIS (ICD-715.90) - known hand, shoulder, knee and CSpine arthritis as noted... takes MOBIC daily & TRAMODOL Prn... she requested appt at [facility] DrO'Rourke- seen 4/09 and he concurred w/ Mobic/ Tramadol for her Osteoarthritis... ~  prev eval DrDaldorf for Ortho 12/08 & 12/09 w/ left hand osteoarth (injected her middle finger PIP), right elbow DJD (improved after injection), left knee torn medial meniscus on MRI (improved after injection), and hx lumbar spinal stenosis treated by injection in the past... ~  MRI left knee 7/08 showed meniscus tear, effusion, & patellofemoral  chondromalacia. ~  XRay left hand 02/16/08 showed DJD, no fractures... ~  Labs 3/09 showed  Hg=12.5, WBC=10.1, BMet=norm w/ K=4.3, sed=41, RA=neg, ANA=neg... ~ Additional labs 9/09 at [facility]- reviewed.  LOW BACK PAIN, CHRONIC (ICD-724.2) - s/p lumbar laminectomy L3-4 for sp stenosis 2006 by DrCram...   PERIPHERAL NEUROPATHY (ICD-356.9) - eval & Rx by DrYan in 2009> NCV's showed polyneuropathy: w/ abn GTT (1hr BS= 216, HgA1c= 6.4) and B12 level= 254; also found Sed=28, CRP=13, +Lyme titer...  placed on low carb diet & started on B12 shots, along w/ 5% Lidocaine cream to apply to feet at night...  OSTEOPOROSIS (ICD-733.00) - she  takes Calcium supplement w/ D & MVI daily... she has osteoporosis w/ partial compression> ?BMDs per GYN? We don't have copies & she's not on meds. ~  Labs 5/10 showed Vit D level = 50  ANXIETY (ICD-300.00) - on ALPRAZOLAM 0.25mg  Prn...  VITAMIN B12 DEFICIENCY (ICD-266.2) - eval by DrYan 9/09 showed B12 level= 254 (211-911)... started on B12 shots which she took for about 70mo & switched to OTC Vit B12 1000 daily... ~  labs 5/11 showed Vit B12 level = 506 (211-911) ~  Labs 5/12 showed Vit B12 level = 728 on B12 oral supplement daily.   Past Surgical History  Procedure Date  . Hysterectomy for uterine cancer   . Anterior and posterior vaginal repair 03/2006    Dr.Neal  . Basal cell skin cancer moh's surgery   . Right hemicolectomy for colon cancer 1994  . Lumbar laminectomy for spinal stenosis 2006    L3-4  . Left carpal tunnel surgery 02/2007    Dr. [name]    Outpatient Encounter Prescriptions as of 04/16/2011  Medication Sig Dispense Refill  . ALPRAZolam (XANAX) 0.25 MG tablet Take 0.25 mg by mouth 3 (three) times daily as needed. For anxiety        . atorvastatin (LIPITOR) 20 MG tablet Take 1 tablet (20 mg total) by mouth daily.  90 tablet  3  . Calcium Carbonate-Vitamin D (CALTRATE 600+D) 600-400 MG-UNIT per tablet Take 1 tablet by mouth daily.        .  carvedilol (COREG) 3.125 MG tablet Take 1 tablet (3.125 mg total) by mouth 2 (two) times daily.  90 tablet  3  . estradiol (CLIMARA - DOSED IN MG/24 HR) 0.1 mg/24hr Place 1 patch onto the skin once a week.        . furosemide (LASIX) 40 MG tablet Take 40 mg by mouth daily.        Marland Kitchen levothyroxine (SYNTHROID, LEVOTHROID) 125 MCG tablet Take 125 mcg by mouth daily.        . meclizine (ANTIVERT) 25 MG tablet Take 1/2 to 1 tablet by mouth every 6 hours as needed for dizziness        . meloxicam (MOBIC) 7.5 MG tablet Take 7.5 mg by mouth daily.        . Multiple Vitamins-Minerals (CENTRUM SILVER PO) Take 1 tablet by mouth daily.        . potassium chloride SA (K-DUR,KLOR-CON) 20 MEQ tablet Take 40 mEq by mouth 2 (two) times daily.    ==> rec decr to Bid    . quinapril (ACCUPRIL) 20 MG tablet Take 20 mg by mouth at bedtime.        Marland Kitchen spironolactone (ALDACTONE) 25 MG tablet Take 25 mg by mouth daily.        . vitamin B-12 (CYANOCOBALAMIN) 1000 MCG tablet Take 1,000 mcg by mouth daily.        Marland Kitchen warfarin (COUMADIN) 5 MG tablet Take by mouth as directed.          Allergies  Allergen Reactions  . Codeine     REACTION: nausea    Review of Systems        See HPI - all other systems neg except as noted... The patient complains of dyspnea on exertion and peripheral edema.  The patient denies anorexia, fever, weight loss, weight gain, vision loss, decreased hearing, hoarseness, chest pain, syncope, prolonged cough, headaches, hemoptysis, abdominal pain, melena, hematochezia, severe indigestion/heartburn, hematuria, incontinence, muscle weakness, suspicious skin  lesions, transient blindness, difficulty walking, depression, unusual weight change, abnormal bleeding, enlarged lymph nodes, and angioedema.     Objective:   Physical Exam     WD, WN, 77 y/o WF in NAD... VITAL SIGNS:  Reviewed... GENERAL:  Alert & oriented; pleasant & cooperative... HEENT:  [place]/AT, EOM-wnl, PERRLA, EACs-clear, TMs-wnl,  NOSE-clear, THROAT-clear & wnl. NECK:  Supple w/ fair ROM; no JVD; normal carotid impulses w/o bruits; no thyromegaly or nodules palpated; no lymphadenopathy. CHEST:  Clear to P & A; without wheezes/ rales/ or rhonchi. HEART:  Regular rhythm, gr 1/6 SEM; without rubs/ or gallops heard... ABDOMEN:  Soft & nontender; normal bowel sounds; no organomegaly or masses detected. EXT: without deformities, mod arthritic changes; no varicose veins/ +venous insuffic/ tr edema. NEURO:  CN's intact;  no focal neuro deficits x distal neuropathy symptoms... DERM:  No lesions noted; no rash etc...   Assessment & Plan:   HBP>  Controlled on her , but K=5.1 on 2Bid (on Lasix & Aldactone), rec decr to 1Bid...  Cardiac> AFib, Mitral Valve Dis, ?PFO, Coumadin via CC> followed by Walker Kehr & stable, continue same meds...  ASPVD> known mod right RAS, mild run-off dis in LEs, & recent ArtDopplers reviewed...  Ven Insuffic> aware- she knows to elim sodium, elev legs, wear support hose when able...  CHOL>  Stable on Lip20, but needs better low fat diet...  BorderlineDM> impaired gluc tolerance on diet alone, A1c= 6.6  HYPOTHY>  Stable on Levothy125 daily...  Hx Colon Cancer> surg 1994, no known recurrence & up to date on screening per DrPerry...   DJD>  As noted- stable on Mobic, Tramadol; followed by DrDaldorf...  Neuropathy>  eval & rx per Neuro, DrYan; she is improved...  Osteoporosis>  ?when last BMD, ?followed by GYN, asked to get copies for our records...  Other medical issues as noted.Marland KitchenMarland Kitchen

## 2011-04-19 ENCOUNTER — Encounter: Payer: Self-pay | Admitting: Pulmonary Disease

## 2011-04-19 ENCOUNTER — Telehealth: Payer: Self-pay | Admitting: *Deleted

## 2011-04-19 ENCOUNTER — Ambulatory Visit (INDEPENDENT_AMBULATORY_CARE_PROVIDER_SITE_OTHER): Payer: Medicare Other | Admitting: *Deleted

## 2011-04-19 DIAGNOSIS — Z7901 Long term (current) use of anticoagulants: Secondary | ICD-10-CM

## 2011-04-19 DIAGNOSIS — I4891 Unspecified atrial fibrillation: Secondary | ICD-10-CM

## 2011-04-19 NOTE — Telephone Encounter (Signed)
Called and spoke with pt and she is aware of lab results per SN--chol ok on lip 20--keep the same and needs better low fat diet, chems ok but k 5.1 so recs to decrease potassium to 1 bid  And cone lasix and aldactone.  Other labs ok and cont the same meds for now.  Pt stated that she will call DR. Neals nurse and have her fax over her last BMD results.

## 2011-04-24 ENCOUNTER — Encounter: Payer: Self-pay | Admitting: Cardiovascular Disease

## 2011-05-01 ENCOUNTER — Ambulatory Visit (INDEPENDENT_AMBULATORY_CARE_PROVIDER_SITE_OTHER): Payer: Medicare Other | Admitting: *Deleted

## 2011-05-01 ENCOUNTER — Ambulatory Visit (INDEPENDENT_AMBULATORY_CARE_PROVIDER_SITE_OTHER): Payer: Medicare Other | Admitting: Cardiovascular Disease

## 2011-05-01 ENCOUNTER — Encounter: Payer: Self-pay | Admitting: Cardiovascular Disease

## 2011-05-01 VITALS — BP 134/77 | HR 83 | Resp 14 | Ht 72.0 in | Wt 191.0 lb

## 2011-05-01 DIAGNOSIS — I4891 Unspecified atrial fibrillation: Secondary | ICD-10-CM

## 2011-05-01 DIAGNOSIS — Z7901 Long term (current) use of anticoagulants: Secondary | ICD-10-CM

## 2011-05-01 LAB — POCT INR: INR: 2.3

## 2011-05-01 NOTE — Assessment & Plan Note (Signed)
INR;s Rx F/U clinic today.  No bleeding problems

## 2011-05-01 NOTE — Progress Notes (Signed)
[name] is seen today for f/U of afib, HTN, and edema. Her coumadin has been Rx. No palpitations, SOB or SSCP. She is walking on a regular basis without symptoms. She is working at [employer] again and she enjoys this.  Her diuretic regiman is complicated as she needs aldactone to avoid taking more than 6 KCL tablets/day  She had a splenic infarct when enrolled in the engage trial and we have not thought about changing her to Pradaxa  Recent right foot surgery by Petrinitz.  Discussed retirement from [employer]   ROS: Denies fever, malais, weight loss, blurry vision, decreased visual acuity, cough, sputum, SOB, hemoptysis, pleuritic pain, palpitaitons, heartburn, abdominal pain, melena, lower extremity edema, claudication, or rash.  All other systems reviewed and negative  General: Affect appropriate Healthy:  appears stated age HEENT: normal Neck supple with no adenopathy JVP normal no bruits no thyromegaly Lungs clear with no wheezing and good diaphragmatic motion Heart:  S1/S2 nSEM murmur no ub, gallop or click PMI normal Abdomen: benighn, BS positve, no tenderness, no AAA no bruit.  No HSM or HJR Distal pulses intact with no bruits No edema Neuro non-focal Skin warm and dry No muscular weakness   Current Outpatient Prescriptions  Medication Sig Dispense Refill  . ALPRAZolam (XANAX) 0.25 MG tablet Take 0.25 mg by mouth 3 (three) times daily as needed. For anxiety        . atorvastatin (LIPITOR) 20 MG tablet Take 1 tablet (20 mg total) by mouth daily.  90 tablet  3  . Calcium Carbonate-Vitamin D (CALTRATE 600+D) 600-400 MG-UNIT per tablet Take 1 tablet by mouth daily.        . carvedilol (COREG) 3.125 MG tablet Take 1 tablet (3.125 mg total) by mouth 2 (two) times daily.  90 tablet  3  . estradiol (CLIMARA - DOSED IN MG/24 HR) 0.1 mg/24hr Place 1 patch onto the skin once a week.        . furosemide (LASIX) 40 MG tablet Take 40 mg by mouth daily.        Marland Kitchen levothyroxine (SYNTHROID,  LEVOTHROID) 125 MCG tablet Take 125 mcg by mouth daily.        . meclizine (ANTIVERT) 25 MG tablet Take 1/2 to 1 tablet by mouth every 6 hours as needed for dizziness        . meloxicam (MOBIC) 7.5 MG tablet Take 7.5 mg by mouth daily.        . Multiple Vitamins-Minerals (CENTRUM SILVER PO) Take 1 tablet by mouth daily.        . potassium chloride SA (K-DUR,KLOR-CON) 20 MEQ tablet Take 20 mEq by mouth 2 (two) times daily.        . quinapril (ACCUPRIL) 20 MG tablet Take 20 mg by mouth at bedtime.        Marland Kitchen spironolactone (ALDACTONE) 25 MG tablet Take 25 mg by mouth daily.        . vitamin B-12 (CYANOCOBALAMIN) 1000 MCG tablet Take 1,000 mcg by mouth daily.        Marland Kitchen warfarin (COUMADIN) 5 MG tablet Take by mouth as directed.        Marland Kitchen DISCONTD: potassium chloride SA (K-DUR,KLOR-CON) 20 MEQ tablet Take 20 mEq by mouth 2 (two) times daily.       Marland Kitchen DISCONTD: PredniSONE 5 MG KIT Take by mouth. 6 day pak; take as directed         Allergies  Codeine  Electrocardiogram: Afib rate 83 nonspecific lateal T wave  chagnes  Assessment and Plan

## 2011-05-01 NOTE — Assessment & Plan Note (Signed)
Well controlled.  Continue current medications and low sodium Dash type diet.    

## 2011-05-01 NOTE — Assessment & Plan Note (Signed)
Good rate control and anticoagulation.  Continue current meds

## 2011-05-01 NOTE — Patient Instructions (Signed)
Your physician recommends that you schedule a follow-up appointment in: 6 MONTHS WITH DR NISHAN  Your physician recommends that you continue on your current medications as directed. Please refer to the Current Medication list given to you today. 

## 2011-05-14 ENCOUNTER — Telehealth: Payer: Self-pay | Admitting: Cardiovascular Disease

## 2011-05-14 MED ORDER — LEVOTHYROXINE SODIUM 125 MCG PO TABS: 125.0000 ug | ORAL_TABLET | Freq: Every day | ORAL | Status: DC

## 2011-05-14 NOTE — Telephone Encounter (Signed)
Refill of synthroid 1.25mg  at [facility]

## 2011-05-23 ENCOUNTER — Other Ambulatory Visit: Payer: Self-pay | Admitting: *Deleted

## 2011-05-23 MED ORDER — ALPRAZOLAM 0.25 MG PO TABS: 0.2500 mg | ORAL_TABLET | Freq: Three times a day (TID) | ORAL | Status: DC | PRN

## 2011-05-29 ENCOUNTER — Ambulatory Visit (INDEPENDENT_AMBULATORY_CARE_PROVIDER_SITE_OTHER): Payer: Medicare Other | Admitting: *Deleted

## 2011-05-29 DIAGNOSIS — Z7901 Long term (current) use of anticoagulants: Secondary | ICD-10-CM

## 2011-05-29 DIAGNOSIS — I4891 Unspecified atrial fibrillation: Secondary | ICD-10-CM

## 2011-05-29 LAB — POCT INR: INR: 3.6

## 2011-06-04 ENCOUNTER — Telehealth: Payer: Self-pay | Admitting: Cardiovascular Disease

## 2011-06-04 MED ORDER — WARFARIN SODIUM 5 MG PO TABS: ORAL_TABLET | ORAL | Status: DC

## 2011-06-04 NOTE — Telephone Encounter (Signed)
Needs refill wafarin 5 mg uses [facility]

## 2011-06-19 ENCOUNTER — Encounter: Payer: Medicare Other | Admitting: *Deleted

## 2011-06-20 ENCOUNTER — Ambulatory Visit (INDEPENDENT_AMBULATORY_CARE_PROVIDER_SITE_OTHER): Payer: Medicare Other | Admitting: *Deleted

## 2011-06-20 DIAGNOSIS — Z7901 Long term (current) use of anticoagulants: Secondary | ICD-10-CM

## 2011-06-20 DIAGNOSIS — I4891 Unspecified atrial fibrillation: Secondary | ICD-10-CM

## 2011-07-05 ENCOUNTER — Ambulatory Visit (INDEPENDENT_AMBULATORY_CARE_PROVIDER_SITE_OTHER): Payer: Medicare Other | Admitting: *Deleted

## 2011-07-05 DIAGNOSIS — Z7901 Long term (current) use of anticoagulants: Secondary | ICD-10-CM

## 2011-07-05 DIAGNOSIS — I4891 Unspecified atrial fibrillation: Secondary | ICD-10-CM

## 2011-07-05 LAB — POCT INR: INR: 2.9

## 2011-07-24 ENCOUNTER — Ambulatory Visit (INDEPENDENT_AMBULATORY_CARE_PROVIDER_SITE_OTHER): Payer: Medicare Other | Admitting: *Deleted

## 2011-07-24 DIAGNOSIS — Z7901 Long term (current) use of anticoagulants: Secondary | ICD-10-CM

## 2011-07-24 DIAGNOSIS — I4891 Unspecified atrial fibrillation: Secondary | ICD-10-CM

## 2011-07-24 LAB — POCT INR: INR: 1.2

## 2011-08-02 ENCOUNTER — Encounter: Payer: Medicare Other | Admitting: *Deleted

## 2011-08-03 ENCOUNTER — Ambulatory Visit (INDEPENDENT_AMBULATORY_CARE_PROVIDER_SITE_OTHER): Payer: Medicare Other | Admitting: *Deleted

## 2011-08-03 DIAGNOSIS — I4891 Unspecified atrial fibrillation: Secondary | ICD-10-CM

## 2011-08-03 DIAGNOSIS — Z7901 Long term (current) use of anticoagulants: Secondary | ICD-10-CM

## 2011-08-17 ENCOUNTER — Ambulatory Visit (INDEPENDENT_AMBULATORY_CARE_PROVIDER_SITE_OTHER): Payer: Medicare Other | Admitting: *Deleted

## 2011-08-17 DIAGNOSIS — I4891 Unspecified atrial fibrillation: Secondary | ICD-10-CM

## 2011-08-17 DIAGNOSIS — Z7901 Long term (current) use of anticoagulants: Secondary | ICD-10-CM

## 2011-08-31 ENCOUNTER — Ambulatory Visit (INDEPENDENT_AMBULATORY_CARE_PROVIDER_SITE_OTHER): Payer: Medicare Other | Admitting: *Deleted

## 2011-08-31 DIAGNOSIS — I4891 Unspecified atrial fibrillation: Secondary | ICD-10-CM

## 2011-08-31 DIAGNOSIS — Z7901 Long term (current) use of anticoagulants: Secondary | ICD-10-CM

## 2011-08-31 LAB — POCT INR: INR: 3.1

## 2011-09-13 ENCOUNTER — Ambulatory Visit (INDEPENDENT_AMBULATORY_CARE_PROVIDER_SITE_OTHER): Payer: Medicare Other | Admitting: *Deleted

## 2011-09-13 DIAGNOSIS — Z7901 Long term (current) use of anticoagulants: Secondary | ICD-10-CM

## 2011-09-13 DIAGNOSIS — I4891 Unspecified atrial fibrillation: Secondary | ICD-10-CM

## 2011-09-13 LAB — BASIC METABOLIC PANEL
BUN: 6
CO2: 27
Chloride: 104
Creatinine, Ser: 0.66
Glucose, Bld: 104 — ABNORMAL HIGH

## 2011-09-13 LAB — CBC
MCHC: 33.7
MCV: 93.1
Platelets: 179
RDW: 13.3

## 2011-09-13 LAB — PROTIME-INR
INR: 2.4 — ABNORMAL HIGH
Prothrombin Time: 23.2 — ABNORMAL HIGH
Prothrombin Time: 27.5 — ABNORMAL HIGH

## 2011-09-14 ENCOUNTER — Encounter: Payer: Medicare Other | Admitting: *Deleted

## 2011-09-17 ENCOUNTER — Other Ambulatory Visit: Payer: Self-pay | Admitting: Internal Medicine

## 2011-10-05 ENCOUNTER — Ambulatory Visit (INDEPENDENT_AMBULATORY_CARE_PROVIDER_SITE_OTHER): Payer: Medicare Other | Admitting: *Deleted

## 2011-10-05 DIAGNOSIS — Z7901 Long term (current) use of anticoagulants: Secondary | ICD-10-CM

## 2011-10-05 DIAGNOSIS — I4891 Unspecified atrial fibrillation: Secondary | ICD-10-CM

## 2011-10-05 LAB — POCT INR: INR: 2.2

## 2011-10-09 ENCOUNTER — Telehealth: Payer: Self-pay | Admitting: Cardiovascular Disease

## 2011-10-09 MED ORDER — FUROSEMIDE 40 MG PO TABS: 40.0000 mg | ORAL_TABLET | Freq: Every day | ORAL | Status: DC

## 2011-10-09 NOTE — Telephone Encounter (Signed)
Rerouted refill for lasix 40 mg to [facility] order per pt request.

## 2011-10-09 NOTE — Telephone Encounter (Signed)
furosimide 40mg  refill needed, uses [facility]

## 2011-10-15 ENCOUNTER — Ambulatory Visit (INDEPENDENT_AMBULATORY_CARE_PROVIDER_SITE_OTHER): Payer: Medicare Other | Admitting: Pulmonary Disease

## 2011-10-15 ENCOUNTER — Encounter: Payer: Self-pay | Admitting: Pulmonary Disease

## 2011-10-15 ENCOUNTER — Other Ambulatory Visit (INDEPENDENT_AMBULATORY_CARE_PROVIDER_SITE_OTHER): Payer: Medicare Other

## 2011-10-15 DIAGNOSIS — M545 Low back pain: Secondary | ICD-10-CM

## 2011-10-15 DIAGNOSIS — Z8542 Personal history of malignant neoplasm of other parts of uterus: Secondary | ICD-10-CM

## 2011-10-15 DIAGNOSIS — I059 Rheumatic mitral valve disease, unspecified: Secondary | ICD-10-CM

## 2011-10-15 DIAGNOSIS — E785 Hyperlipidemia, unspecified: Secondary | ICD-10-CM

## 2011-10-15 DIAGNOSIS — I872 Venous insufficiency (chronic) (peripheral): Secondary | ICD-10-CM

## 2011-10-15 DIAGNOSIS — I10 Essential (primary) hypertension: Secondary | ICD-10-CM

## 2011-10-15 DIAGNOSIS — E739 Lactose intolerance, unspecified: Secondary | ICD-10-CM

## 2011-10-15 DIAGNOSIS — G609 Hereditary and idiopathic neuropathy, unspecified: Secondary | ICD-10-CM

## 2011-10-15 DIAGNOSIS — F411 Generalized anxiety disorder: Secondary | ICD-10-CM

## 2011-10-15 DIAGNOSIS — I4891 Unspecified atrial fibrillation: Secondary | ICD-10-CM

## 2011-10-15 DIAGNOSIS — E039 Hypothyroidism, unspecified: Secondary | ICD-10-CM

## 2011-10-15 DIAGNOSIS — D7389 Other diseases of spleen: Secondary | ICD-10-CM

## 2011-10-15 DIAGNOSIS — C189 Malignant neoplasm of colon, unspecified: Secondary | ICD-10-CM

## 2011-10-15 LAB — BASIC METABOLIC PANEL
BUN: 27 mg/dL — ABNORMAL HIGH (ref 6–23)
CO2: 26 mEq/L (ref 19–32)
Calcium: 8.9 mg/dL (ref 8.4–10.5)
Chloride: 106 mEq/L (ref 96–112)
Creatinine, Ser: 1 mg/dL (ref 0.4–1.2)
Glucose, Bld: 103 mg/dL — ABNORMAL HIGH (ref 70–99)

## 2011-10-15 MED ORDER — MECLIZINE HCL 25 MG PO TABS: ORAL_TABLET | ORAL | Status: AC

## 2011-10-15 NOTE — Patient Instructions (Signed)
Today we updated your med list in our EPIC system...    Continue your current medications the same...    We refilled the ANTIVERT as requested...  Today we did your follow up fasting blood work...    Please call the PHONE TREE in a few days for your results...    Dial [id number] & when prompted enter your patient number followed by the # symbol...    Your patient number is:  [id number]  Call for any problems...  Let's plan another routine follow up in 6 months, sooner if needed for problems.Marland KitchenMarland Kitchen

## 2011-10-15 NOTE — Progress Notes (Signed)
Subjective:    Patient ID: [name], female    DOB: 10/10/1929, 77 y.o.   MRN: [id number]  HPI 77 y/o WF here for a follow up visit... she has multiple medical problems as listed below...   ~  Apr 17, 2010:  she is under increasing stress w/ husb's illness- prob pancreatic Ca, followed at [facility], he's lost >140#, etc... saw Walker Kehr for her HBP, edema, AFib on coumadin> stable, she takes extra Accupril if BP up, sched for Myoview soon...  she is due for f/u CXR (clear, NAD); fasting labs (OK- see below); & needs colonoscopy (sched w/ DrPerry for 05/02/10)...  ~  October 19, 2010:   her husb passed away 06-19-10 then incr stress w/ his daughter, she has Therapist, sports for Prn use... she had abn Myoview 5/11, then cath 6/11 w/ essent norm coronaries, & 2DEcho showing norm LV, dilated LA & RV, AoV sclerosis & mild AI, MV thickening & mild MS & mod MR... she saw Walker Kehr 8/11- no changes made... also had colon 6/11 by DrPerry- divertics, 2 sm polyps, prev right hemicolectomy- path= tubular adenoma & f/u planned 59yrs.  ~  Apr 16, 2011:  14mo ROV & she is c/o lack of energy, feeling weak, nothing more specific; she had hammer toe surg (Art Dopplers LEs 2/12 per DrPetrinitz were normal) & hasn't been able to exercise- pin to be removed soon & asked to incr exerc program; [facility] for f/u HBP, AFib, edema, MV dis, Coumadin rx- stable, no changes made;  BP controlled on meds & labs show K=5.1 on Lasix, Aldactone & KCl- 4 daily (rec to decr to 2/d);  Chol is good on Lip20 but TG still sl incr & rec to get on a better low fat diet;  FBS=98 & A1c=6.6 on diet alone;  Other labs OK- see prob list below>>  ~  October 15, 2011:  14mo ROV & she has noted some mild vertigo, requests refill Meclizine- ok;  Reports otherw doing well, she has retired from her job at [employer];  BP controlled on meds- reviewed w/ pt;  She denies CP, palpit, SOB, syncope, cerebral ischemic symptoms, edema;  She had right foot surg by  DrPetrinitz- pin removed and now able to ambulate, exercise better, etc;  BMet today looks good (pt on Lasix, Aldactone, and KCl w/ K=4.3)...  See prob list below>>    She saw Walker Kehr 6/12 for follow up AFib/ HBP/ Edema> asymptomatic, doing well, no changes made; she remains on Coumadin via the CC...          Problem List:  HYPERTENSION (ICD-401.9) - controlled on COREG 3.125mg Bid, ACCUPRIL 20mg /d, LASIX 40mg /d, ALDACTONE 25mg /d & K20Bid... ~  labs 9/09 at Neuro showed K= 4.3, BUN= 15, Creat= 0.6 on Coreg3.125Bid, Accupril20, Lasix40, K20-2Bid. ~  labs 1/10 showed K= 4.8, BUN= 21, Creat= 0.6 ~  labs 5/10 showed K= 4.5, BUN= 17, Creat= 0.6..Marland Kitchen Continue same. ~  labs 5/11 showed K= 4.8, BUN= 19, Creat= 0.7; CXR- cardiomeg, mild hyperinflation, clear, NAD. ~  5/12:  BP= 124/72 today and similar at home unless stressed... denies HA, fatigue, visual changes, CP, palipit, dizziness, syncope, dyspnea, edema, etc... ~  Labs 5/12 showed K= 5.1, BUN= 21, Creat= 0.8; CXR- mild cardiomeg, clear, NAD; rec to decr K20 from 4/d to 2/d... ~  11/12:  BP= 112/74 & she remains asymptomatic; BMet remaiins wnl w/ K=4.3  ATRIAL FIBRILLATION (ICD-427.31) - on Coumadin per cardiology & followed in the [facility].Marland KitchenMarland Kitchen  MITRAL VALVE DISORDER (ICD-424.0) - followed by Walker Kehr & seen Q57mo... ? of PATENT FORAMEN OVALE (ICD-745.5) ~  NuclearStressTest 11/05 showed norm perfusion, mild apic thinning, no ischemia, EF=79%... ~  2DEcho 9/08 showed norm LVF, EF=55-60%, no regional wall motion abn, mod thickening of MV, mod MR, mod dil LA, ?PFO vs small ASD.Marland Kitchen. (prev echo's w/ mild MS)... ~  2DEcho 2/10 showed norm LVF w/o regional wall motion abn & EF= 55%, mild MS & mod MR (Rheumatic deformity), mod dil LA & RA, etc... ~  Myoview 5/11 showed  +ischemia & ST depression, EF= 77% ~  Cath 6/11 showed essent norm coronaries (min LAD plaque), dcalcif AoV, norm LVF, MR... ~  2DEcho 6/11 showed norm LVF, mild LVH, dil RV w/ mild  decr RVF, dil LA, mild MV thickening w/ mild MS & mod MR, AoV sclerosis & no stenosis.  PERIPHERAL VASCULAR DISEASE (ICD-443.9) - tortuous Ao on CXR...  ~  CTAngio Abd&Legs 7/08 showed mild to mod right RAS, no signif PVD, mild run-off disease in legs... ~  LE Art Dopplers 2/12 showed normal ABIs, normal toe indicies as well, biphasic & broadened PTA waveforms noted...  VENOUS INSUFFICIENCY (ICD-459.81) - on LASIX 40mg /d, ALDACTONE 25mg /d, + KCl - 2Bid... she follows a low sodium diet, elevates, support hose...  HYPERLIPIDEMIA (ICD-272.4) - on LIPITOR 20mg /d + diet Rx...  ~  FLP 5/08 showed TChol 100, TG 65, HDL 40, LDL 47 ~  FLP 3/09 showed TChol 114, TG 85, HDL 35, LDL 62 ~  FLP 9/09 at Neuro showed TChol 125, TG 118, HDL 34, LDL 67 ~  FLP 5/10 showed TChol 136, TG 127, HDL 36, LDL 74 ~  FLP 5/11 showed TChol 177, TG 167, HDL 51, LDL 93 ~  FLP 5/12 on Lip20 showed TChol 149, TG 168, HDL 47, LDL 69... rec same med, better low fat diet.  IMPAIRED GLUCOSE TOLERANCE (ICD-271.3) - on diet  alone> see eval by Sula Soda, Neurology... ~  GTT 9/09 showed FBS= 101, 1hr BS= 216, A1c= 6.4 ~  labs 5/10 showed BS= 97, A1c= 6.7 ~  labs 5/11 showed BS= 107, A1c= 6.5 ~  Labs 5/12 showed BS= 98, A1c= 6.6.Marland KitchenMarland Kitchen Continue diet Rx.  HYPOTHYROIDISM (ICD-244.9) - on SYNTHROID 142mcg/d... ~  TSH 6/08 was normal at 2.95 ~  TSH 3/09 was 2.18 ~  labs 9/09 at Neuro showed TSH= 0.62 ~  labs 5/10 showed TSH= 0.71 ~  labs 5/11 showed TSH= 0.31 ~  Labs 5/12 on Levothy125 showed TSH= 0.92... Continue same.  Hx of COLON CANCER (ICD-153.9) - s/p right hemicolectomy in 1994... no known recurrence. ~  f/u colonoscopy 05/02/10 w/ DrPerry showed divertics, 2 sm polyps= tubular adenoma, f/u planned 42yrs.  Hx of SPLENIC INFARCTION (ICD-289.59) - eval for left side pain 6/08 w/ CTAbd showing splenic infarct (she was on a coumadin alternative study drug) & switched to coumadin at that time...  UTERINE CANCER, HX OF  (ICD-V10.42) - s/p hysterectomy, subseq AP repair 5/07 DrNeal... ~  She continues w/ annual GYN f/u DrNeal & she reports doing satis...  Hx of URINARY TRACT INFECTION (ICD-599.0)  OSTEOARTHRITIS (ICD-715.90) - known hand, shoulder, knee and CSpine arthritis as noted... takes MOBIC daily & TRAMODOL Prn... she requested appt at [facility] DrO'Rourke- seen 4/09 and he concurred w/ Mobic/ Tramadol for her Osteoarthritis... ~  prev eval DrDaldorf for Ortho 12/08 & 12/09 w/ left hand osteoarth (injected her middle finger PIP), right elbow DJD (improved after injection), left knee torn medial  meniscus on MRI (improved after injection), and hx lumbar spinal stenosis treated by injection in the past... ~  MRI left knee 7/08 showed meniscus tear, effusion, & patellofemoral chondromalacia. ~  XRay left hand 02/16/08 showed DJD, no fractures... ~  Labs 3/09 showed  Hg=12.5, WBC=10.1, BMet=norm w/ K=4.3, sed=41, RA=neg, ANA=neg... ~ Additional labs 9/09 at [facility]- reviewed.  LOW BACK PAIN, CHRONIC (ICD-724.2) - s/p lumbar laminectomy L3-4 for sp stenosis 2006 by DrCram...   PERIPHERAL NEUROPATHY (ICD-356.9) - eval & Rx by DrYan in 2009> NCV's showed polyneuropathy: w/ abn GTT (1hr BS= 216, HgA1c= 6.4) and B12 level= 254; also found Sed=28, CRP=13, +Lyme titer...  placed on low carb diet & started on B12 shots, along w/ 5% Lidocaine cream to apply to feet at night...  OSTEOPOROSIS (ICD-733.00) - she takes Calcium supplement w/ D & MVI daily... she has osteoporosis w/ partial compression> BMDs per GYN- DrNeal (we don't have copies & she's not on meds- "I take calcium & VitD"). ~  Labs 5/10 showed Vit D level = 50  ANXIETY (ICD-300.00) - on ALPRAZOLAM 0.25mg  Prn...  VITAMIN B12 DEFICIENCY (ICD-266.2) - eval by DrYan 9/09 showed B12 level= 254 (211-911)... started on B12 shots which she took for about 26mo & switched to OTC Vit B12 1000 daily... ~  labs 5/11 showed Vit B12 level = 506 (211-911) ~  Labs 5/12 showed Vit B12  level = 728 on B12 oral supplement daily.   Past Surgical History  Procedure Date  . Hysterectomy for uterine cancer   . Anterior and posterior vaginal repair 03/2006    Dr.Neal  . Basal cell skin cancer moh's surgery   . Right hemicolectomy for colon cancer 1994  . Lumbar laminectomy for spinal stenosis 2006    L3-4  . Left carpal tunnel surgery 02/2007    Dr. [name]    Outpatient Encounter Prescriptions as of 10/15/2011  Medication Sig Dispense Refill  . ALPRAZolam (XANAX) 0.25 MG tablet Take 1 tablet (0.25 mg total) by mouth 3 (three) times daily as needed. For anxiety   90 tablet  1  . atorvastatin (LIPITOR) 20 MG tablet Take 1 tablet (20 mg total) by mouth daily.  90 tablet  3  . Calcium Carbonate-Vitamin D (CALTRATE 600+D) 600-400 MG-UNIT per tablet Take 1 tablet by mouth daily.        . carvedilol (COREG) 3.125 MG tablet Take 1 tablet (3.125 mg total) by mouth 2 (two) times daily.  90 tablet  3  . estradiol (CLIMARA - DOSED IN MG/24 HR) 0.1 mg/24hr Place 1 patch onto the skin once a week.        . furosemide (LASIX) 40 MG tablet Take 1 tablet (40 mg total) by mouth daily.  30 tablet  5  . levothyroxine (SYNTHROID, LEVOTHROID) 125 MCG tablet Take 1 tablet (125 mcg total) by mouth daily.  90 tablet  4  . meclizine (ANTIVERT) 25 MG tablet Take 1/2 to 1 tablet by mouth every 6 hours as needed for dizziness   30 tablet  11  . meloxicam (MOBIC) 7.5 MG tablet Take 7.5 mg by mouth daily.        . Multiple Vitamins-Minerals (CENTRUM SILVER PO) Take 1 tablet by mouth daily.        . potassium chloride SA (KLOR-CON M20) 20 MEQ tablet  Take one tab twice daily      . quinapril (ACCUPRIL) 20 MG tablet Take 20 mg by mouth at bedtime.        Marland Kitchen  spironolactone (ALDACTONE) 25 MG tablet Take 25 mg by mouth daily.        . vitamin B-12 (CYANOCOBALAMIN) 1000 MCG tablet Take 1,000 mcg by mouth daily.        Marland Kitchen warfarin (COUMADIN) 5 MG tablet Take as directed by Anticoagulation clinic   120  tablet  3    Allergies  Allergen Reactions  . Codeine     REACTION: nausea    Current Medications, Allergies, Past Medical History, Past Surgical History, Family History, and Social History were reviewed in [employer] record.   Review of Systems        See HPI - all other systems neg except as noted... The patient complains of dyspnea on exertion and peripheral edema.  The patient denies anorexia, fever, weight loss, weight gain, vision loss, decreased hearing, hoarseness, chest pain, syncope, prolonged cough, headaches, hemoptysis, abdominal pain, melena, hematochezia, severe indigestion/heartburn, hematuria, incontinence, muscle weakness, suspicious skin lesions, transient blindness, difficulty walking, depression, unusual weight change, abnormal bleeding, enlarged lymph nodes, and angioedema.     Objective:   Physical Exam     WD, WN, 77 y/o WF in NAD... VITAL SIGNS:  Reviewed... GENERAL:  Alert & oriented; pleasant & cooperative... HEENT:  [place]/AT, EOM-wnl, PERRLA, EACs-clear, TMs-wnl, NOSE-clear, THROAT-clear & wnl. NECK:  Supple w/ fair ROM; no JVD; normal carotid impulses w/o bruits; no thyromegaly or nodules palpated; no lymphadenopathy. CHEST:  Clear to P & A; without wheezes/ rales/ or rhonchi. HEART:  Regular rhythm, gr 1/6 SEM; without rubs/ or gallops heard... ABDOMEN:  Soft & nontender; normal bowel sounds; no organomegaly or masses detected. EXT: without deformities, mod arthritic changes; no varicose veins/ +venous insuffic/ tr edema. NEURO:  CN's intact;  no focal neuro deficits x distal neuropathy symptoms... DERM:  No lesions noted; no rash etc...  RADIOLOGY DATA:  Reviewed in the EPIC EMR & discussed w/ the patient...  LABORATORY DATA:  Reviewed in the EPIC EMR & discussed w/ the patient...   Assessment & Plan:   HBP>  Controlled on her current med rgimen; continue same, low sodium, keep wt down...  Cardiac> AFib, Mitral Valve Dis,  ?PFO, Coumadin via CC> followed by Walker Kehr & stable, continue same meds...  ASPVD> known mod right RAS, mild run-off dis in LEs, & recent ArtDopplers reviewed...  Ven Insuffic> aware- she knows to elim sodium, elev legs, wear support hose when able...  CHOL>  Stable on Lip20, but needs better low fat diet...  BorderlineDM> impaired gluc tolerance on diet alone, A1c= 6.6  HYPOTHY>  Stable on Levothy125 daily...  Hx Colon Cancer> surg 1994, no known recurrence & up to date on screening per DrPerry...   DJD>  As noted- stable on Mobic, Tramadol; followed by DrDaldorf...  Neuropathy>  eval & rx per Neuro, DrYan; she is improved...  Osteoporosis>  ?when last BMD> followed by GYN, asked to get copies for our records...  Other medical issues as noted.Marland KitchenMarland Kitchen

## 2011-11-01 ENCOUNTER — Encounter: Payer: Self-pay | Admitting: Pulmonary Disease

## 2011-11-02 ENCOUNTER — Ambulatory Visit (INDEPENDENT_AMBULATORY_CARE_PROVIDER_SITE_OTHER): Payer: Medicare Other | Admitting: *Deleted

## 2011-11-02 DIAGNOSIS — Z7901 Long term (current) use of anticoagulants: Secondary | ICD-10-CM

## 2011-11-02 DIAGNOSIS — I4891 Unspecified atrial fibrillation: Secondary | ICD-10-CM

## 2011-12-13 ENCOUNTER — Telehealth: Payer: Self-pay | Admitting: Pulmonary Disease

## 2011-12-13 ENCOUNTER — Ambulatory Visit (INDEPENDENT_AMBULATORY_CARE_PROVIDER_SITE_OTHER): Payer: Medicare Other | Admitting: *Deleted

## 2011-12-13 DIAGNOSIS — Z7901 Long term (current) use of anticoagulants: Secondary | ICD-10-CM | POA: Diagnosis not present

## 2011-12-13 DIAGNOSIS — I4891 Unspecified atrial fibrillation: Secondary | ICD-10-CM | POA: Diagnosis not present

## 2011-12-13 LAB — POCT INR: INR: 2.2

## 2011-12-13 MED ORDER — MELOXICAM 7.5 MG PO TABS: 7.5000 mg | ORAL_TABLET | Freq: Every day | ORAL | Status: DC

## 2011-12-13 NOTE — Telephone Encounter (Signed)
Called and spoke with pt. Pt requests 90 day rx for Mobic called into [facility].  Rx sent to pharmacy.  Pt aware.

## 2011-12-14 ENCOUNTER — Encounter: Payer: Medicare Other | Admitting: *Deleted

## 2012-01-01 ENCOUNTER — Other Ambulatory Visit: Payer: Self-pay | Admitting: Dermatology

## 2012-01-01 DIAGNOSIS — L570 Actinic keratosis: Secondary | ICD-10-CM | POA: Diagnosis not present

## 2012-01-01 DIAGNOSIS — L821 Other seborrheic keratosis: Secondary | ICD-10-CM | POA: Diagnosis not present

## 2012-01-16 ENCOUNTER — Other Ambulatory Visit: Payer: Self-pay | Admitting: Cardiovascular Disease

## 2012-01-16 MED ORDER — QUINAPRIL HCL 20 MG PO TABS: 20.0000 mg | ORAL_TABLET | Freq: Every day | ORAL | Status: DC

## 2012-01-20 ENCOUNTER — Other Ambulatory Visit: Payer: Self-pay | Admitting: Cardiovascular Disease

## 2012-01-24 ENCOUNTER — Ambulatory Visit (INDEPENDENT_AMBULATORY_CARE_PROVIDER_SITE_OTHER): Payer: Medicare Other | Admitting: Pharmacist

## 2012-01-24 DIAGNOSIS — I4891 Unspecified atrial fibrillation: Secondary | ICD-10-CM | POA: Diagnosis not present

## 2012-01-24 DIAGNOSIS — Z7901 Long term (current) use of anticoagulants: Secondary | ICD-10-CM

## 2012-01-29 ENCOUNTER — Ambulatory Visit (INDEPENDENT_AMBULATORY_CARE_PROVIDER_SITE_OTHER): Payer: Medicare Other | Admitting: Cardiovascular Disease

## 2012-01-29 ENCOUNTER — Encounter: Payer: Self-pay | Admitting: Cardiovascular Disease

## 2012-01-29 DIAGNOSIS — I4891 Unspecified atrial fibrillation: Secondary | ICD-10-CM

## 2012-01-29 DIAGNOSIS — I10 Essential (primary) hypertension: Secondary | ICD-10-CM

## 2012-01-29 DIAGNOSIS — E785 Hyperlipidemia, unspecified: Secondary | ICD-10-CM | POA: Diagnosis not present

## 2012-01-29 MED ORDER — CARVEDILOL 6.25 MG PO TABS: 6.2500 mg | ORAL_TABLET | Freq: Two times a day (BID) | ORAL | Status: DC

## 2012-01-29 MED ORDER — QUINAPRIL HCL 20 MG PO TABS: 20.0000 mg | ORAL_TABLET | Freq: Every day | ORAL | Status: DC

## 2012-01-29 NOTE — Assessment & Plan Note (Signed)
Increase beta blocker and take quinapril in am F/U 4 weeks

## 2012-01-29 NOTE — Patient Instructions (Signed)
Your physician recommends that you schedule a follow-up appointment in: 4 WEEKS WITH DR [facility] Your physician has recommended you make the following change in your medication: START TAKING ACUUPRIL IN AM  AND INCREASE CARVEDILOL  TO 6.25 MG  TWICE DAILY Your physician recommends that you return for lab work in: TODAY  BMET CBC WITH DIFF AND TSH  DX 427.31 401.1

## 2012-01-29 NOTE — Assessment & Plan Note (Signed)
No bleeding problems Chronic afib.  History of splenic infarct.  May need lovenox bridge in future

## 2012-01-29 NOTE — Progress Notes (Signed)
Patient ID: [name], female   DOB: [birth year], 78 y.o.   MRN: [id number] [name] is seen today for f/U of afib, HTN, and edema. Her coumadin has been Rx. No palpitations, SOB or SSCP. She is walking on a regular basis without symptoms. She retired from [employer] a year ago.  Her husband [name] died and she is now living in apartment near [employer]   Aldactone has worked well as a diuretic for edema as she requires much less K.  She had a splenic infarct when enrolled in the engage trial and we have not thought about changing her to Pradaxa   Recent right foot surgery by Petrinitz.   BP and pulse seem to be elevated lately  ROS: Denies fever, malais, weight loss, blurry vision, decreased visual acuity, cough, sputum, SOB, hemoptysis, pleuritic pain, palpitaitons, heartburn, abdominal pain, melena, lower extremity edema, claudication, or rash.  All other systems reviewed and negative  General: Affect appropriate Healthy:  appears stated age HEENT: normal Neck supple with no adenopathy JVP normal no bruits no thyromegaly Lungs clear with no wheezing and good diaphragmatic motion Heart:  S1/S2 no murmur, no rub, gallop or click PMI normal Abdomen: benighn, BS positve, no tenderness, no AAA no bruit.  No HSM or HJR Distal pulses intact with no bruits No edema Neuro non-focal Skin warm and dry No muscular weakness   Current Outpatient Prescriptions  Medication Sig Dispense Refill  . ALPRAZolam (XANAX) 0.25 MG tablet Take 1 tablet (0.25 mg total) by mouth 3 (three) times daily as needed. For anxiety   90 tablet  1  . atorvastatin (LIPITOR) 20 MG tablet Take 1 tablet (20 mg total) by mouth daily.  90 tablet  3  . Calcium Carbonate-Vitamin D (CALTRATE 600+D) 600-400 MG-UNIT per tablet Take 1 tablet by mouth daily.        . carvedilol (COREG) 6.25 MG tablet Take 1 tablet (6.25 mg total) by mouth 2 (two) times daily.  180 tablet  3  . estradiol (CLIMARA - DOSED IN MG/24 HR) 0.1 mg/24hr  Place 1 patch onto the skin once a week.        . furosemide (LASIX) 40 MG tablet Take 1 tablet (40 mg total) by mouth daily.  30 tablet  5  . levothyroxine (SYNTHROID, LEVOTHROID) 125 MCG tablet Take 1 tablet (125 mcg total) by mouth daily.  90 tablet  4  . meclizine (ANTIVERT) 25 MG tablet Take 1/2 to 1 tablet by mouth every 6 hours as needed for dizziness   30 tablet  11  . meloxicam (MOBIC) 7.5 MG tablet Take 1 tablet (7.5 mg total) by mouth daily.  90 tablet  3  . Multiple Vitamins-Minerals (CENTRUM SILVER PO) Take 1 tablet by mouth daily.        . potassium chloride SA (KLOR-CON M20) 20 MEQ tablet Take 20 mEq by mouth 2 (two) times daily.       . quinapril (ACCUPRIL) 20 MG tablet Take 1 tablet (20 mg total) by mouth daily.  90 tablet  1  . spironolactone (ALDACTONE) 25 MG tablet TAKE 1 TABLET EVERY DAY  30 tablet  5  . vitamin B-12 (CYANOCOBALAMIN) 1000 MCG tablet Take 1,000 mcg by mouth daily.        Marland Kitchen warfarin (COUMADIN) 5 MG tablet Take as directed by Anticoagulation clinic   120 tablet  3    Allergies  Codeine  Electrocardiogram:  Assessment and Plan

## 2012-01-29 NOTE — Assessment & Plan Note (Signed)
Increase beta blocker.  Continue coumadin.  Good anticoagulations

## 2012-01-29 NOTE — Assessment & Plan Note (Signed)
Cholesterol is at goal.  Continue current dose of statin and diet Rx.  No myalgias or side effects.  F/U  LFT's in 6 months. Lab Results  Component Value Date   LDLCALC 69 04/16/2011

## 2012-01-30 ENCOUNTER — Other Ambulatory Visit: Payer: Self-pay | Admitting: *Deleted

## 2012-01-30 LAB — CBC WITH DIFFERENTIAL/PLATELET
Basophils Relative: 1.1 % (ref 0.0–3.0)
Eosinophils Absolute: 0.2 10*3/uL (ref 0.0–0.7)
Eosinophils Relative: 5.2 % — ABNORMAL HIGH (ref 0.0–5.0)
HCT: 39.2 % (ref 36.0–46.0)
Hemoglobin: 13.2 g/dL (ref 12.0–15.0)
MCHC: 33.6 g/dL (ref 30.0–36.0)
MCV: 96.1 fl (ref 78.0–100.0)
Monocytes Absolute: 0.6 10*3/uL (ref 0.1–1.0)
Neutro Abs: 1.6 10*3/uL (ref 1.4–7.7)
Neutrophils Relative %: 35.9 % — ABNORMAL LOW (ref 43.0–77.0)
RBC: 4.08 Mil/uL (ref 3.87–5.11)
WBC: 4.4 10*3/uL — ABNORMAL LOW (ref 4.5–10.5)

## 2012-01-30 MED ORDER — ALPRAZOLAM 0.25 MG PO TABS: 0.2500 mg | ORAL_TABLET | Freq: Three times a day (TID) | ORAL | Status: AC | PRN

## 2012-02-05 DIAGNOSIS — Z1212 Encounter for screening for malignant neoplasm of rectum: Secondary | ICD-10-CM | POA: Diagnosis not present

## 2012-02-05 DIAGNOSIS — Z130 Encounter for screening for diseases of the blood and blood-forming organs and certain disorders involving the immune mechanism: Secondary | ICD-10-CM | POA: Diagnosis not present

## 2012-02-05 DIAGNOSIS — Z1231 Encounter for screening mammogram for malignant neoplasm of breast: Secondary | ICD-10-CM | POA: Diagnosis not present

## 2012-02-05 DIAGNOSIS — Z124 Encounter for screening for malignant neoplasm of cervix: Secondary | ICD-10-CM | POA: Diagnosis not present

## 2012-03-05 ENCOUNTER — Ambulatory Visit (INDEPENDENT_AMBULATORY_CARE_PROVIDER_SITE_OTHER): Payer: Medicare Other | Admitting: Cardiovascular Disease

## 2012-03-05 ENCOUNTER — Ambulatory Visit (INDEPENDENT_AMBULATORY_CARE_PROVIDER_SITE_OTHER): Payer: Medicare Other | Admitting: *Deleted

## 2012-03-05 VITALS — BP 119/73 | HR 86 | Ht 71.0 in | Wt 200.0 lb

## 2012-03-05 DIAGNOSIS — R011 Cardiac murmur, unspecified: Secondary | ICD-10-CM | POA: Diagnosis not present

## 2012-03-05 DIAGNOSIS — I4891 Unspecified atrial fibrillation: Secondary | ICD-10-CM | POA: Diagnosis not present

## 2012-03-05 DIAGNOSIS — Z7901 Long term (current) use of anticoagulants: Secondary | ICD-10-CM

## 2012-03-05 DIAGNOSIS — R002 Palpitations: Secondary | ICD-10-CM | POA: Diagnosis not present

## 2012-03-05 NOTE — Assessment & Plan Note (Signed)
Continued palpitatoins.  Despite increase in coreg.  Event monitor may need to add calcium blocker.  Continue anticoagulation with history of splenic infarct

## 2012-03-05 NOTE — Patient Instructions (Addendum)
Your physician recommends that you schedule a follow-up appointment in: 4-6 WEEKS WITH DR [name] ECHO SAME DAY Your physician recommends that you continue on your current medications as directed. Please refer to the Current Medication list given to you today. Your physician has recommended that you wear an event monitor. Event monitors are medical devices that record the heart's electrical activity. Doctors most often [place] these monitors to diagnose arrhythmias. Arrhythmias are problems with the speed or rhythm of the heartbeat. The monitor is a small, portable device. You can wear one while you do your normal daily activities. This is usually used to diagnose what is causing palpitations/syncope (passing out). 21 DAY EVENT  DX  PALPITATIONS AFIB Your physician has requested that you have an echocardiogram. Echocardiography is a painless test that uses sound waves to create images of your heart. It provides your doctor with information about the size and shape of your heart and how well your heart's chambers and valves are working. This procedure takes approximately one hour. There are no restrictions for this procedure. DX MURMUR  4-6 WEEKS SEE DR [name] SAME DAY

## 2012-03-05 NOTE — Progress Notes (Signed)
[name] is seen today for f/U of afib, HTN, and edema. Her coumadin has been Rx. No palpitations, SOB or SSCP. She is walking on a regular basis without symptoms. She retired from [employer] a year ago. Her husband [name] died and she is now living in apartment near [employer] Aldactone has worked well as a diuretic for edema as she requires much less K. She had a splenic infarct when enrolled in the engage trial and we have not thought about changing her to Pradaxa  Recent right foot surgery by Petrinitz.  BP and pulse seem to be elevated lately  Last visit beta blocker increased  BP better but still with dyspnea and palpitations.  Started back doing water aerobics at the [facility]  ROS: Denies fever, malais, weight loss, blurry vision, decreased visual acuity, cough, sputum, SOB, hemoptysis, pleuritic pain, palpitaitons, heartburn, abdominal pain, melena, lower extremity edema, claudication, or rash.  All other systems reviewed and negative  General: Affect appropriate Healthy:  appears stated age HEENT: normal Neck supple with no adenopathy JVP normal no bruits no thyromegaly Lungs clear with no wheezing and good diaphragmatic motion Heart:  S1/S2 SEM murmur, no rub, gallop or click PMI normal Abdomen: benighn, BS positve, no tenderness, no AAA no bruit.  No HSM or HJR Distal pulses intact with no bruits No edema Neuro non-focal Skin warm and dry No muscular weakness   Current Outpatient Prescriptions  Medication Sig Dispense Refill  . ALPRAZolam (XANAX) 0.25 MG tablet Take 1 tablet (0.25 mg total) by mouth 3 (three) times daily as needed for anxiety. For anxiety  90 tablet  5  . atorvastatin (LIPITOR) 20 MG tablet Take 1 tablet (20 mg total) by mouth daily.  90 tablet  3  . Calcium Carbonate-Vitamin D (CALTRATE 600+D) 600-400 MG-UNIT per tablet Take 1 tablet by mouth daily.        . carvedilol (COREG) 6.25 MG tablet Take 1 tablet (6.25 mg total) by mouth 2 (two) times daily.  180 tablet   3  . estradiol (CLIMARA - DOSED IN MG/24 HR) 0.1 mg/24hr Place 1 patch onto the skin once a week.        . furosemide (LASIX) 40 MG tablet Take 1 tablet (40 mg total) by mouth daily.  30 tablet  5  . levothyroxine (SYNTHROID, LEVOTHROID) 125 MCG tablet Take 1 tablet (125 mcg total) by mouth daily.  90 tablet  4  . meclizine (ANTIVERT) 25 MG tablet Take 1/2 to 1 tablet by mouth every 6 hours as needed for dizziness   30 tablet  11  . meloxicam (MOBIC) 7.5 MG tablet Take 1 tablet (7.5 mg total) by mouth daily.  90 tablet  3  . Multiple Vitamins-Minerals (CENTRUM SILVER PO) Take 1 tablet by mouth daily.        . potassium chloride SA (KLOR-CON M20) 20 MEQ tablet Take 20 mEq by mouth daily.       . quinapril (ACCUPRIL) 20 MG tablet Take 1 tablet (20 mg total) by mouth daily.  90 tablet  1  . spironolactone (ALDACTONE) 25 MG tablet TAKE 1 TABLET EVERY DAY  30 tablet  5  . vitamin B-12 (CYANOCOBALAMIN) 1000 MCG tablet Take 1,000 mcg by mouth daily.        Marland Kitchen warfarin (COUMADIN) 5 MG tablet Take as directed by Anticoagulation clinic   120 tablet  3    Allergies  Codeine  Electrocardiogram:  Assessment and Plan

## 2012-03-05 NOTE — Assessment & Plan Note (Signed)
Cholesterol is at goal.  Continue current dose of statin and diet Rx.  No myalgias or side effects.  F/U  LFT's in 6 months. Lab Results  Component Value Date   LDLCALC 69 04/16/2011             

## 2012-03-05 NOTE — Assessment & Plan Note (Signed)
Mild MS and moderate MR 2 years ago.  With dyspnea will recheck echo

## 2012-03-11 ENCOUNTER — Encounter (INDEPENDENT_AMBULATORY_CARE_PROVIDER_SITE_OTHER): Payer: Medicare Other

## 2012-03-11 DIAGNOSIS — I4891 Unspecified atrial fibrillation: Secondary | ICD-10-CM | POA: Diagnosis not present

## 2012-03-11 DIAGNOSIS — R002 Palpitations: Secondary | ICD-10-CM

## 2012-03-18 ENCOUNTER — Other Ambulatory Visit: Payer: Self-pay | Admitting: Cardiovascular Disease

## 2012-03-18 MED ORDER — FUROSEMIDE 40 MG PO TABS: 40.0000 mg | ORAL_TABLET | Freq: Every day | ORAL | Status: DC

## 2012-03-18 NOTE — Telephone Encounter (Signed)
New message:  Pt needs refill all info confirmed

## 2012-04-08 ENCOUNTER — Ambulatory Visit (INDEPENDENT_AMBULATORY_CARE_PROVIDER_SITE_OTHER): Payer: Medicare Other

## 2012-04-08 ENCOUNTER — Ambulatory Visit (HOSPITAL_COMMUNITY): Payer: Medicare Other | Attending: Cardiology

## 2012-04-08 ENCOUNTER — Encounter: Payer: Self-pay | Admitting: Cardiovascular Disease

## 2012-04-08 ENCOUNTER — Ambulatory Visit (INDEPENDENT_AMBULATORY_CARE_PROVIDER_SITE_OTHER): Payer: Medicare Other | Admitting: Cardiovascular Disease

## 2012-04-08 ENCOUNTER — Other Ambulatory Visit: Payer: Self-pay

## 2012-04-08 VITALS — BP 134/74 | HR 66 | Wt 200.0 lb

## 2012-04-08 DIAGNOSIS — I4891 Unspecified atrial fibrillation: Secondary | ICD-10-CM | POA: Insufficient documentation

## 2012-04-08 DIAGNOSIS — E785 Hyperlipidemia, unspecified: Secondary | ICD-10-CM | POA: Insufficient documentation

## 2012-04-08 DIAGNOSIS — R011 Cardiac murmur, unspecified: Secondary | ICD-10-CM

## 2012-04-08 DIAGNOSIS — I059 Rheumatic mitral valve disease, unspecified: Secondary | ICD-10-CM

## 2012-04-08 DIAGNOSIS — Z7901 Long term (current) use of anticoagulants: Secondary | ICD-10-CM | POA: Diagnosis not present

## 2012-04-08 DIAGNOSIS — R002 Palpitations: Secondary | ICD-10-CM | POA: Insufficient documentation

## 2012-04-08 DIAGNOSIS — I080 Rheumatic disorders of both mitral and aortic valves: Secondary | ICD-10-CM | POA: Insufficient documentation

## 2012-04-08 DIAGNOSIS — R0609 Other forms of dyspnea: Secondary | ICD-10-CM | POA: Insufficient documentation

## 2012-04-08 DIAGNOSIS — R0989 Other specified symptoms and signs involving the circulatory and respiratory systems: Secondary | ICD-10-CM | POA: Insufficient documentation

## 2012-04-08 DIAGNOSIS — I10 Essential (primary) hypertension: Secondary | ICD-10-CM

## 2012-04-08 DIAGNOSIS — C189 Malignant neoplasm of colon, unspecified: Secondary | ICD-10-CM

## 2012-04-08 LAB — POCT INR: INR: 2.7

## 2012-04-08 MED ORDER — ATORVASTATIN CALCIUM 20 MG PO TABS: 20.0000 mg | ORAL_TABLET | Freq: Every day | ORAL | Status: DC

## 2012-04-08 NOTE — Progress Notes (Signed)
Patient ID: [name], female   DOB: [birth year], 78 y.o.   MRN: [id number] [name] is seen today for f/U of afib, HTN, and edema. Her coumadin has been Rx. Palpitatoins are stable , SOB or SSCP. She is walking on a regular basis without symptoms. She retired from [employer] a year ago. Her husband [name] died and she is now living in apartment near [employer] Aldactone has worked well as a diuretic for edema as she requires much less K. She had a splenic infarct when enrolled in the engage trial and we have not thought about changing her to Pradaxa  Recent right foot surgery by [name].   BP and pulse seem to be elevated lately  Last visit beta blocker increased BP better but still with dyspnea and palpitations. Started back doing water aerobics at the [facility]  Reviewed echo from today and ha mild AS/AR mild to moderate MS and mild MR with EF 60% and severe LAE.   Reviewed her monitor and average HR in high 60's and longest pause only 1.38 seconds.  Max HR 166 and slowest 44  I think overall her rate control is adeaquate.  Her MS may end of giving her more symptoms than anything else  ROS: Denies fever, malais, weight loss, blurry vision, decreased visual acuity, cough, sputum, SOB, hemoptysis, pleuritic pain, palpitaitons, heartburn, abdominal pain, melena, lower extremity edema, claudication, or rash.  All other systems reviewed and negative  General: Affect appropriate Healthy:  appears stated age HEENT: normal Neck supple with no adenopathy JVP normal no bruits no thyromegaly Lungs clear with no wheezing and good diaphragmatic motion Heart:  S1/S2 AS  murmur, no rub, gallop or click cannot hear diastolic rumble PMI normal Abdomen: benighn, BS positve, no tenderness, no AAA no bruit.  No HSM or HJR Distal pulses intact with no bruits No edema Neuro non-focal Skin warm and dry No muscular weakness   Current Outpatient Prescriptions  Medication Sig Dispense Refill  . ALPRAZolam  (XANAX) 0.25 MG tablet Take 1 tablet (0.25 mg total) by mouth 3 (three) times daily as needed for anxiety. For anxiety  90 tablet  5  . atorvastatin (LIPITOR) 20 MG tablet Take 1 tablet (20 mg total) by mouth daily.  90 tablet  3  . Calcium Carbonate-Vitamin D (CALTRATE 600+D) 600-400 MG-UNIT per tablet Take 1 tablet by mouth daily.        . carvedilol (COREG) 6.25 MG tablet Take 1 tablet (6.25 mg total) by mouth 2 (two) times daily.  180 tablet  3  . estradiol (CLIMARA - DOSED IN MG/24 HR) 0.1 mg/24hr Place 1 patch onto the skin once a week.        . furosemide (LASIX) 40 MG tablet Take 1 tablet (40 mg total) by mouth daily.  90 tablet  3  . levothyroxine (SYNTHROID, LEVOTHROID) 125 MCG tablet Take 1 tablet (125 mcg total) by mouth daily.  90 tablet  4  . meclizine (ANTIVERT) 25 MG tablet Take 1/2 to 1 tablet by mouth every 6 hours as needed for dizziness   30 tablet  11  . meloxicam (MOBIC) 7.5 MG tablet Take 1 tablet (7.5 mg total) by mouth daily.  90 tablet  3  . Multiple Vitamins-Minerals (CENTRUM SILVER PO) Take 1 tablet by mouth daily.        . potassium chloride SA (KLOR-CON M20) 20 MEQ tablet Take 20 mEq by mouth daily.       . quinapril (ACCUPRIL) 20 MG  tablet Take 1 tablet (20 mg total) by mouth daily.  90 tablet  1  . spironolactone (ALDACTONE) 25 MG tablet TAKE 1 TABLET EVERY DAY  30 tablet  5  . vitamin B-12 (CYANOCOBALAMIN) 1000 MCG tablet Take 1,000 mcg by mouth daily.        Marland Kitchen DISCONTD: atorvastatin (LIPITOR) 20 MG tablet Take 1 tablet (20 mg total) by mouth daily.  90 tablet  3  . warfarin (COUMADIN) 5 MG tablet Take as directed by Anticoagulation clinic   120 tablet  3    Allergies  Codeine  Electrocardiogram:  Assessment and Plan

## 2012-04-08 NOTE — Assessment & Plan Note (Signed)
F/U echo in a year continue coumadin and beta blocker as worst valve lesion is MS.  MR not too bad and if MS becomes symtomatic may be a candidate for inoue balloon Rx

## 2012-04-08 NOTE — Assessment & Plan Note (Signed)
Well controlled.  Continue current medications and low sodium Dash type diet.    

## 2012-04-08 NOTE — Assessment & Plan Note (Signed)
Given monitor results would continue current dose of meds.  F/U coumadin clinic high risk for CVA with MS and afib

## 2012-04-08 NOTE — Assessment & Plan Note (Signed)
NO anemia F/U colonoscopy GI

## 2012-04-08 NOTE — Patient Instructions (Signed)
Your physician wants you to follow-up in:  6 MONTHS WITH DR NISHAN  You will receive a reminder letter in the mail two months in advance. If you don't receive a letter, please call our office to schedule the follow-up appointment. Your physician recommends that you continue on your current medications as directed. Please refer to the Current Medication list given to you today. 

## 2012-04-08 NOTE — Assessment & Plan Note (Signed)
Cholesterol is at goal.  Continue current dose of statin and diet Rx.  No myalgias or side effects.  F/U  LFT's in 6 months. Lab Results  Component Value Date   LDLCALC 69 04/16/2011

## 2012-04-09 ENCOUNTER — Other Ambulatory Visit: Payer: Self-pay | Admitting: *Deleted

## 2012-04-09 MED ORDER — WARFARIN SODIUM 5 MG PO TABS: ORAL_TABLET | ORAL | Status: DC

## 2012-04-14 ENCOUNTER — Ambulatory Visit (INDEPENDENT_AMBULATORY_CARE_PROVIDER_SITE_OTHER): Payer: Medicare Other | Admitting: Pulmonary Disease

## 2012-04-14 ENCOUNTER — Ambulatory Visit (INDEPENDENT_AMBULATORY_CARE_PROVIDER_SITE_OTHER)
Admission: RE | Admit: 2012-04-14 | Discharge: 2012-04-14 | Disposition: A | Discharge status: Home / Self Care | Payer: Medicare Other | Source: Ambulatory Visit | Attending: Pulmonary Disease | Admitting: Pulmonary Disease

## 2012-04-14 ENCOUNTER — Encounter: Payer: Self-pay | Admitting: Pulmonary Disease

## 2012-04-14 ENCOUNTER — Other Ambulatory Visit (INDEPENDENT_AMBULATORY_CARE_PROVIDER_SITE_OTHER): Payer: Medicare Other

## 2012-04-14 VITALS — BP 118/64 | HR 66 | Temp 97.4°F | Ht 71.0 in | Wt 199.6 lb

## 2012-04-14 DIAGNOSIS — E039 Hypothyroidism, unspecified: Secondary | ICD-10-CM

## 2012-04-14 DIAGNOSIS — E538 Deficiency of other specified B group vitamins: Secondary | ICD-10-CM

## 2012-04-14 DIAGNOSIS — M545 Low back pain, unspecified: Secondary | ICD-10-CM

## 2012-04-14 DIAGNOSIS — I4891 Unspecified atrial fibrillation: Secondary | ICD-10-CM

## 2012-04-14 DIAGNOSIS — G609 Hereditary and idiopathic neuropathy, unspecified: Secondary | ICD-10-CM

## 2012-04-14 DIAGNOSIS — E739 Lactose intolerance, unspecified: Secondary | ICD-10-CM

## 2012-04-14 DIAGNOSIS — I10 Essential (primary) hypertension: Secondary | ICD-10-CM

## 2012-04-14 DIAGNOSIS — F411 Generalized anxiety disorder: Secondary | ICD-10-CM

## 2012-04-14 DIAGNOSIS — I059 Rheumatic mitral valve disease, unspecified: Secondary | ICD-10-CM

## 2012-04-14 DIAGNOSIS — M1990 Unspecified osteoarthritis, unspecified site: Secondary | ICD-10-CM

## 2012-04-14 DIAGNOSIS — J984 Other disorders of lung: Secondary | ICD-10-CM | POA: Diagnosis not present

## 2012-04-14 DIAGNOSIS — C189 Malignant neoplasm of colon, unspecified: Secondary | ICD-10-CM

## 2012-04-14 DIAGNOSIS — I739 Peripheral vascular disease, unspecified: Secondary | ICD-10-CM

## 2012-04-14 DIAGNOSIS — E785 Hyperlipidemia, unspecified: Secondary | ICD-10-CM

## 2012-04-14 DIAGNOSIS — I872 Venous insufficiency (chronic) (peripheral): Secondary | ICD-10-CM

## 2012-04-14 DIAGNOSIS — J449 Chronic obstructive pulmonary disease, unspecified: Secondary | ICD-10-CM | POA: Diagnosis not present

## 2012-04-14 LAB — BASIC METABOLIC PANEL
BUN: 25 mg/dL — ABNORMAL HIGH (ref 6–23)
Calcium: 8.8 mg/dL (ref 8.4–10.5)
GFR: 56.55 mL/min — ABNORMAL LOW (ref >60.00)
Glucose, Bld: 110 mg/dL — ABNORMAL HIGH (ref 70–99)
Potassium: 4.8 mEq/L (ref 3.5–5.1)
Sodium: 139 mEq/L (ref 135–145)

## 2012-04-14 LAB — HEPATIC FUNCTION PANEL
Albumin: 4.1 g/dL (ref 3.5–5.2)
Alkaline Phosphatase: 55 U/L (ref 39–117)
Total Bilirubin: 1 mg/dL (ref 0.3–1.2)

## 2012-04-14 LAB — CBC WITH DIFFERENTIAL/PLATELET
Eosinophils Relative: 4.9 % (ref 0.0–5.0)
HCT: 40.6 % (ref 36.0–46.0)
Hemoglobin: 13.6 g/dL (ref 12.0–15.0)
Lymphs Abs: 1.7 10*3/uL (ref 0.7–4.0)
MCV: 95.8 fl (ref 78.0–100.0)
Monocytes Absolute: 0.7 10*3/uL (ref 0.1–1.0)
Monocytes Relative: 8.8 % (ref 3.0–12.0)
Neutro Abs: 4.8 10*3/uL (ref 1.4–7.7)
WBC: 7.6 10*3/uL (ref 4.5–10.5)

## 2012-04-14 LAB — LIPID PANEL
HDL: 48.3 mg/dL (ref >39.00)
LDL Cholesterol: 74 mg/dL (ref 0–99)
VLDL: 17.6 mg/dL (ref 0.0–40.0)

## 2012-04-14 NOTE — Progress Notes (Signed)
Subjective:    Patient ID: [name], female    DOB: Jul 11, 1929, 78 y.o.   MRN: [id number]  HPI 78 y/o WF here for a follow up visit... she has multiple medical problems as listed below...   ~  November 06, 2010:   her husb passed away 07-07-10 then incr stress w/ his daughter, she has Therapist, sports for Prn use... she had abn Myoview 5/11, then cath 6/11 w/ essent norm coronaries, & 2DEcho showing norm LV, dilated LA & RV, AoV sclerosis & mild AI, MV thickening & mild MS & mod MR... she saw Walker Kehr 8/11- no changes made... also had colon 6/11 by DrPerry- divertics, 2 sm polyps, prev right hemicolectomy- path= tubular adenoma & f/u planned 70yrs.  ~  Apr 16, 2011:  62mo ROV & she is c/o lack of energy, feeling weak, nothing more specific; she had hammer toe surg (Art Dopplers LEs 2/12 per DrPetrinitz were normal) & hasn't been able to exercise- pin to be removed soon & asked to incr exerc program; she [facility] for f/u HBP, AFib, edema, MV dis, Coumadin rx- stable, no changes made;  BP controlled on meds & labs show K=5.1 on Lasix, Aldactone & KCl- 4 daily (rec to decr to 2/d);  Chol is good on Lip20 but TG still sl incr & rec to get on a better low fat diet;  FBS=98 & A1c=6.6 on diet alone;  Other labs OK- see prob list below>>  ~  October 15, 2011:  62mo ROV & she has noted some mild vertigo, requests refill Meclizine- ok;  Reports otherw doing well, she has retired from her job at [employer];  BP controlled on meds- reviewed w/ pt;  She denies CP, palpit, SOB, syncope, cerebral ischemic symptoms, edema;  She had right foot surg by DrPetrinitz- pin removed and now able to ambulate, exercise better, etc;  BMet today looks good (pt on Lasix, Aldactone, and KCl w/ K=4.3)...  See prob list below>>    She saw Walker Kehr 6/12 for follow up AFib/ HBP/ Edema> asymptomatic, doing well, no changes made; she remains on Coumadin via the CC...  ~  Apr 14, 2012:  62mo ROV & [name] has had several visits w/ Walker Kehr  this spring for elev BP & palpit> he follows her for HBP, AFib, Valv heart dis, Edema;  2DEcho 5/13 showed norm LV size & function w/ EF=60-65%, calcif Aortic valve leaflets w/ mild AS& AI, severe LAdil, mild to mod MS & mild MR;  Holter Monitor showed chr AFib w/ rates 46-166; he increased Coreg to 6.25Bid & added Accupril20mg /d...    She had right foot hammertoe surg by DrPetrinitz 4/13     We reviewed prob list, meds, xrays & labs>  See below>>  CXR 5/13 showede borderline heart size, sl hyperinflation w/ lingular scarring, NAD... LABS 5/13:  FLP- at goals on Lip20;  Chems- ok w/ BS=110 A1c=6.4;  CBC- wnl;  TSH=1.52 on [id number];  B12=1074          Problem List:  HYPERTENSION (ICD-401.9) - controlled on COREG 3.125mg Bid, ACCUPRIL 20mg /d, LASIX 40mg /d, ALDACTONE 25mg /d & K20Bid... ~  labs 9/09 at Neuro showed K= 4.3, BUN= 15, Creat= 0.6 on Coreg3.125Bid, Accupril20, Lasix40, K20-2Bid. ~  labs 1/10 showed K= 4.8, BUN= 21, Creat= 0.6 ~  labs 5/10 showed K= 4.5, BUN= 17, Creat= 0.6..Marland Kitchen Continue same. ~  labs 5/11 showed K= 4.8, BUN= 19, Creat= 0.7; CXR- cardiomeg, mild hyperinflation, clear, NAD. ~  5/12:  BP=  124/72 today and similar at home unless stressed... denies HA, fatigue, visual changes, CP, palipit, dizziness, syncope, dyspnea, edema, etc... ~  Labs 5/12 showed K= 5.1, BUN= 21, Creat= 0.8; CXR- mild cardiomeg, clear, NAD; rec to decr K20 from 4/d to 2/d... ~  11/12:  BP= 112/74 & she remains asymptomatic; BMet remaiins wnl w/ K=4.3 ~  5/13:  BP= 118/64 & she she denies symptoms of CP, palpit, SOB, etc...  ATRIAL FIBRILLATION (ICD-427.31) - on Coumadin per cardiology & followed in the Coumadin Clinic... MITRAL VALVE DISORDER (ICD-424.0) - followed by Walker Kehr & seen Q2mo... ? of PATENT FORAMEN OVALE (ICD-745.5) ~  NuclearStressTest 11/05 showed norm perfusion, mild apic thinning, no ischemia, EF=79%... ~  2DEcho 9/08 showed norm LVF, EF=55-60%, no regional wall motion abn, mod thickening  of MV, mod MR, mod dil LA, ?PFO vs small ASD.Marland Kitchen. (prev echo's w/ mild MS)... ~  2DEcho 2/10 showed norm LVF w/o regional wall motion abn & EF= 55%, mild MS & mod MR (Rheumatic deformity), mod dil LA & RA, etc... ~  Myoview 5/11 showed  +ischemia & ST depression, EF= 77% ~  Cath 6/11 showed essent norm coronaries (min LAD plaque), dcalcif AoV, norm LVF, MR... ~  2DEcho 6/11 showed norm LVF, mild LVH, dil RV w/ mild decr RVF, dil LA, mild MV thickening w/ mild MS & mod MR, AoV sclerosis & no stenosis. ~  2DEcho 5/13 showed norm LV size & function w/ EF=60-65%, calcif Ao leaflets w/ mild AS& AI, severe LAdil, mild to mod MS & mild MR, Aorta is ok...  PERIPHERAL VASCULAR DISEASE (ICD-443.9) - tortuous Ao on CXR...  ~  CTAngio Abd&Legs 7/08 showed mild to mod right RAS, no signif PVD, mild run-off disease in legs... ~  LE Art Dopplers 2/12 showed normal ABIs, normal toe indicies as well, biphasic & broadened PTA waveforms noted...  VENOUS INSUFFICIENCY (ICD-459.81) - on LASIX 40mg /d, ALDACTONE 25mg /d, + KCl - 2Bid... she follows a low sodium diet, elevates, support hose...  HYPERLIPIDEMIA (ICD-272.4) - on LIPITOR 20mg /d + diet Rx...  ~  FLP 5/08 showed TChol 100, TG 65, HDL 40, LDL 47 ~  FLP 3/09 showed TChol 114, TG 85, HDL 35, LDL 62 ~  FLP 9/09 at Neuro showed TChol 125, TG 118, HDL 34, LDL 67 ~  FLP 5/10 showed TChol 136, TG 127, HDL 36, LDL 74 ~  FLP 5/11 showed TChol 177, TG 167, HDL 51, LDL 93 ~  FLP 5/12 on Lip20 showed TChol 149, TG 168, HDL 47, LDL 69... rec same med, better low fat diet. ~  FLP 5/13 on Lip20 showed TChol 140, TG 88, HDL 48, LDL 74  IMPAIRED GLUCOSE TOLERANCE (ICD-271.3) - on diet  alone> see eval by Sula Soda, Neurology... ~  GTT 9/09 showed FBS= 101, 1hr BS= 216, A1c= 6.4 ~  labs 5/10 showed BS= 97, A1c= 6.7 ~  labs 5/11 showed BS= 107, A1c= 6.5 ~  Labs 5/12 showed BS= 98, A1c= 6.6.Marland KitchenMarland Kitchen Continue diet Rx. ~  Labs 5/13 showed BS= 110, A1c= 6.4  HYPOTHYROIDISM  (ICD-244.9) - on SYNTHROID 165mcg/d... ~  TSH 6/08 was normal at 2.95 ~  TSH 3/09 was 2.18 ~  labs 9/09 at Neuro showed TSH= 0.62 ~  labs 5/10 showed TSH= 0.71 ~  labs 5/11 showed TSH= 0.31 ~  Labs 5/12 on Levothy125 showed TSH= 0.92... Continue same. ~  Labs 5/13 on Levo125 showed TSH= 1.52  Hx of COLON CANCER (ICD-153.9) - s/p right hemicolectomy in  1994... no known recurrence. ~  f/u colonoscopy 05/02/10 w/ DrPerry showed divertics, 2 sm polyps= tubular adenoma, f/u planned 66yrs.  Hx of SPLENIC INFARCTION (ICD-289.59) - eval for left side pain 6/08 w/ CTAbd showing splenic infarct (she was on a coumadin alternative study drug) & switched to coumadin at that time...  UTERINE CANCER, HX OF (ICD-V10.42) - s/p hysterectomy, subseq AP repair 5/07 DrNeal... ~  She continues w/ annual GYN f/u DrNeal & she reports doing satis...  Hx of URINARY TRACT INFECTION (ICD-599.0)  OSTEOARTHRITIS (ICD-715.90) - known hand, shoulder, knee and CSpine arthritis as noted... takes MOBIC daily & TRAMODOL Prn... she [facility] app[facility] DrO'Rourke- seen 4/09 and he concurred w/ Mobic/ Tramadol for her Osteoarthritis... ~  prev eval DrDaldorf for Ortho 12/08 & 12/09 w/ left hand osteoarth (injected her middle finger PIP), right elbow DJD (improved after injection), left knee torn medial meniscus on MRI (improved after injection), and hx lumbar spinal stenosis treated by injection in the past... ~  MRI left knee 7/08 showed meniscus tear, effusion, & patellofemoral chondromalacia. ~  XRay left hand 02/16/08 showed DJD, no fractures... ~  Labs 3/09 showed  Hg=12.5, WBC=10.1, BMet=norm w/ K=4.3, sed=41, RA=neg, ANA=neg... ~ Additional labs 9/09 at [facility]- reviewed.  LOW BACK PAIN, CHRONIC (ICD-724.2) - s/p lumbar laminectomy L3-4 for sp stenosis 2006 by DrCram...   PERIPHERAL NEUROPATHY (ICD-356.9) - eval & Rx by DrYan in 2009> NCV's showed polyneuropathy: w/ abn GTT (1hr BS= 216, HgA1c= 6.4) and B12 level= 254; also  found Sed=28, CRP=13, +Lyme titer...  placed on low carb diet & started on B12 shots, along w/ 5% Lidocaine cream to apply to feet at night...  OSTEOPOROSIS (ICD-733.00) - she takes Calcium supplement w/ D & MVI daily... she has osteoporosis w/ partial compression> BMDs per GYN- DrNeal (we don't have copies & she's not on meds- "I take calcium & VitD"). ~  Labs 5/10 showed Vit D level = 50  ANXIETY (ICD-300.00) - on ALPRAZOLAM 0.25mg  Prn...  VITAMIN B12 DEFICIENCY (ICD-266.2) - eval by DrYan 9/09 showed B12 level= 254 (211-911)... started on B12 shots which she took for about 27mo & switched to OTC Vit B12 1000 daily... ~  labs 5/11 showed Vit B12 level = 506 (211-911) ~  Labs 5/12 showed Vit B12 level = 728 on B12 oral supplement daily. ~  Labs 5/13 showed B12 level = 1074 & ok to decr to 1/2 tab daily...   Past Surgical History  Procedure Date  . Hysterectomy for uterine cancer   . Anterior and posterior vaginal repair 03/2006    Dr.Neal  . Basal cell skin cancer moh's surgery   . Right hemicolectomy for colon cancer 1994  . Lumbar laminectomy for spinal stenosis 2006    L3-4  . Left carpal tunnel surgery 02/2007    Dr. [name]    Outpatient Encounter Prescriptions as of 04/14/2012  Medication Sig Dispense Refill  . ALPRAZolam (XANAX) 0.25 MG tablet Take 1 tablet (0.25 mg total) by mouth 3 (three) times daily as needed for anxiety. For anxiety  90 tablet  5  . atorvastatin (LIPITOR) 20 MG tablet Take 1 tablet (20 mg total) by mouth daily.  90 tablet  3  . Calcium Carbonate-Vitamin D (CALTRATE 600+D) 600-400 MG-UNIT per tablet Take 1 tablet by mouth daily.        . carvedilol (COREG) 6.25 MG tablet Take 1 tablet (6.25 mg total) by mouth 2 (two) times daily.  180 tablet  3  . estradiol (CLIMARA - DOSED IN MG/24 HR) 0.1 mg/24hr Place 1 patch onto the skin once a week.        . furosemide (LASIX) 40 MG tablet Take 1 tablet (40 mg total) by mouth daily.  90 tablet  3  .  levothyroxine (SYNTHROID, LEVOTHROID) 125 MCG tablet Take 1 tablet (125 mcg total) by mouth daily.  90 tablet  4  . meclizine (ANTIVERT) 25 MG tablet Take 1/2 to 1 tablet by mouth every 6 hours as needed for dizziness   30 tablet  11  . meloxicam (MOBIC) 7.5 MG tablet Take 1 tablet (7.5 mg total) by mouth daily.  90 tablet  3  . Multiple Vitamins-Minerals (CENTRUM SILVER PO) Take 1 tablet by mouth daily.        . potassium chloride SA (KLOR-CON M20) 20 MEQ tablet Take 20 mEq by mouth daily.       . quinapril (ACCUPRIL) 20 MG tablet Take 1 tablet (20 mg total) by mouth daily.  90 tablet  1  . spironolactone (ALDACTONE) 25 MG tablet TAKE 1 TABLET EVERY DAY  30 tablet  5  . vitamin B-12 (CYANOCOBALAMIN) 1000 MCG tablet Take 1,000 mcg by mouth daily.        Marland Kitchen warfarin (COUMADIN) 5 MG tablet Take as directed by Anticoagulation clinic  120 tablet  1    Allergies  Allergen Reactions  . Codeine     REACTION: nausea    Current Medications, Allergies, Past Medical History, Past Surgical History, Family History, and Social History were reviewed in [employer] record.   Review of Systems        See HPI - all other systems neg except as noted... The patient complains of dyspnea on exertion and peripheral edema.  The patient denies anorexia, fever, weight loss, weight gain, vision loss, decreased hearing, hoarseness, chest pain, syncope, prolonged cough, headaches, hemoptysis, abdominal pain, melena, hematochezia, severe indigestion/heartburn, hematuria, incontinence, muscle weakness, suspicious skin lesions, transient blindness, difficulty walking, depression, unusual weight change, abnormal bleeding, enlarged lymph nodes, and angioedema.     Objective:   Physical Exam     WD, WN, 78 y/o WF in NAD... VITAL SIGNS:  Reviewed... GENERAL:  Alert & oriented; pleasant & cooperative... HEENT:  [place]/AT, EOM-wnl, PERRLA, EACs-clear, TMs-wnl, NOSE-clear, THROAT-clear & wnl. NECK:   Supple w/ fair ROM; no JVD; normal carotid impulses w/o bruits; no thyromegaly or nodules palpated; no lymphadenopathy. CHEST:  Clear to P & A; without wheezes/ rales/ or rhonchi. HEART:  Regular rhythm, gr 1/6 SEM; without rubs/ or gallops heard... ABDOMEN:  Soft & nontender; normal bowel sounds; no organomegaly or masses detected. EXT: without deformities, mod arthritic changes; no varicose veins/ +venous insuffic/ tr edema. NEURO:  CN's intact;  no focal neuro deficits x distal neuropathy symptoms... DERM:  No lesions noted; no rash etc...  RADIOLOGY DATA:  Reviewed in the EPIC EMR & discussed w/ the patient...  LABORATORY DATA:  Reviewed in the EPIC EMR & discussed w/ the patient...   Assessment & Plan:   HBP>  Controlled on her current med rgimen; continue same, low sodium, keep wt down...  Cardiac> AFib, Mitral Valve Dis, ?PFO, Coumadin via CC> followed by Walker Kehr & stable, continue same meds...  ASPVD> known mod right RAS, mild run-off dis in LEs, & recent ArtDopplers reviewed...  Ven Insuffic> aware- she knows to elim sodium, elev legs, wear support hose when able...  CHOL>  Stable on Lip20, but needs  better low fat diet...  BorderlineDM> impaired gluc tolerance on diet alone, A1c= 6.6  HYPOTHY>  Stable on Levothy125 daily...  Hx Colon Cancer> surg 1994, no known recurrence & up to date on screening per DrPerry...   DJD>  As noted- stable on Mobic, Tramadol; followed by DrDaldorf...  Neuropathy>  eval & rx per Neuro, DrYan; she is improved...  Osteoporosis>  ?when last BMD> followed by GYN, asked to get copies for our records...  Other medical issues as noted...   Patient's Medications  New Prescriptions   No medications on file  Previous Medications   ALPRAZOLAM (XANAX) 0.25 MG TABLET    Take 1 tablet (0.25 mg total) by mouth 3 (three) times daily as needed for anxiety. For anxiety   ATORVASTATIN (LIPITOR) 20 MG TABLET    Take 1 tablet (20 mg total) by mouth  daily.   CALCIUM CARBONATE-VITAMIN D (CALTRATE 600+D) 600-400 MG-UNIT PER TABLET    Take 1 tablet by mouth daily.     CARVEDILOL (COREG) 6.25 MG TABLET    Take 1 tablet (6.25 mg total) by mouth 2 (two) times daily.   ESTRADIOL (CLIMARA - DOSED IN MG/24 HR) 0.1 MG/24HR    Place 1 patch onto the skin once a week.     FUROSEMIDE (LASIX) 40 MG TABLET    Take 1 tablet (40 mg total) by mouth daily.   LEVOTHYROXINE (SYNTHROID, LEVOTHROID) 125 MCG TABLET    Take 1 tablet (125 mcg total) by mouth daily.   MECLIZINE (ANTIVERT) 25 MG TABLET    Take 1/2 to 1 tablet by mouth every 6 hours as needed for dizziness    MELOXICAM (MOBIC) 7.5 MG TABLET    Take 1 tablet (7.5 mg total) by mouth daily.   MULTIPLE VITAMINS-MINERALS (CENTRUM SILVER PO)    Take 1 tablet by mouth daily.     POTASSIUM CHLORIDE SA (KLOR-CON M20) 20 MEQ TABLET    Take 20 mEq by mouth 2 (two) times daily.    QUINAPRIL (ACCUPRIL) 20 MG TABLET    Take 1 tablet (20 mg total) by mouth daily.   SPIRONOLACTONE (ALDACTONE) 25 MG TABLET    TAKE 1 TABLET EVERY DAY   VITAMIN B-12 (CYANOCOBALAMIN) 1000 MCG TABLET    Take 1,000 mcg by mouth daily.     WARFARIN (COUMADIN) 5 MG TABLET    Take as directed by Anticoagulation clinic  Modified Medications   No medications on file  Discontinued Medications   No medications on file

## 2012-04-14 NOTE — Patient Instructions (Signed)
Today we updated your med list in our EPIC system...    Continue your current medications the same...  Today we did your follow up CXR & FASTING blood work...    We will call you w/ the results when avail...  Call for any questions...  Let's plan a follow up visit in 6 months.Marland KitchenMarland Kitchen

## 2012-04-21 ENCOUNTER — Encounter: Payer: Self-pay | Admitting: Pulmonary Disease

## 2012-05-19 ENCOUNTER — Other Ambulatory Visit: Payer: Self-pay | Admitting: Dermatology

## 2012-05-19 DIAGNOSIS — C44721 Squamous cell carcinoma of skin of unspecified lower limb, including hip: Secondary | ICD-10-CM | POA: Diagnosis not present

## 2012-05-20 ENCOUNTER — Ambulatory Visit (INDEPENDENT_AMBULATORY_CARE_PROVIDER_SITE_OTHER): Payer: Medicare Other | Admitting: Pharmacist

## 2012-05-20 DIAGNOSIS — Z7901 Long term (current) use of anticoagulants: Secondary | ICD-10-CM

## 2012-05-20 DIAGNOSIS — I4891 Unspecified atrial fibrillation: Secondary | ICD-10-CM | POA: Diagnosis not present

## 2012-06-10 DIAGNOSIS — C44721 Squamous cell carcinoma of skin of unspecified lower limb, including hip: Secondary | ICD-10-CM | POA: Diagnosis not present

## 2012-06-17 ENCOUNTER — Ambulatory Visit (INDEPENDENT_AMBULATORY_CARE_PROVIDER_SITE_OTHER): Payer: Medicare Other

## 2012-06-17 DIAGNOSIS — I4891 Unspecified atrial fibrillation: Secondary | ICD-10-CM

## 2012-06-17 DIAGNOSIS — Z7901 Long term (current) use of anticoagulants: Secondary | ICD-10-CM | POA: Diagnosis not present

## 2012-06-17 LAB — POCT INR: INR: 2.4

## 2012-07-01 DIAGNOSIS — L821 Other seborrheic keratosis: Secondary | ICD-10-CM | POA: Diagnosis not present

## 2012-07-01 DIAGNOSIS — L570 Actinic keratosis: Secondary | ICD-10-CM | POA: Diagnosis not present

## 2012-07-15 ENCOUNTER — Ambulatory Visit (INDEPENDENT_AMBULATORY_CARE_PROVIDER_SITE_OTHER): Payer: Medicare Other | Admitting: *Deleted

## 2012-07-15 DIAGNOSIS — I4891 Unspecified atrial fibrillation: Secondary | ICD-10-CM | POA: Diagnosis not present

## 2012-07-15 DIAGNOSIS — Z7901 Long term (current) use of anticoagulants: Secondary | ICD-10-CM | POA: Diagnosis not present

## 2012-07-16 ENCOUNTER — Other Ambulatory Visit: Payer: Self-pay | Admitting: Cardiovascular Disease

## 2012-07-22 ENCOUNTER — Other Ambulatory Visit: Payer: Self-pay | Admitting: Cardiovascular Disease

## 2012-07-22 MED ORDER — QUINAPRIL HCL 20 MG PO TABS: 20.0000 mg | ORAL_TABLET | Freq: Every day | ORAL | Status: DC

## 2012-08-01 ENCOUNTER — Other Ambulatory Visit: Payer: Self-pay | Admitting: Cardiovascular Disease

## 2012-08-01 MED ORDER — LEVOTHYROXINE SODIUM 125 MCG PO TABS: 125.0000 ug | ORAL_TABLET | Freq: Every day | ORAL | Status: DC

## 2012-08-01 NOTE — Telephone Encounter (Signed)
Refill - pt quinapril (ACCUPRIL) 20 MG tablet medication notes say sent to [facility] 8/27.  ES does not have any note of this medication RX being recvd.    Patient is ordering levothyroxine (SYNTHROID, LEVOTHROID) 125 MCG tablet for themselves

## 2012-08-01 NOTE — Telephone Encounter (Signed)
ADDITIONAL:  PT CALLED BACK AND STATED THAT NEITHER  PRESCRIPTION HAS BEEN RECEIVED BY [facility].  SHE CALLED THEM THIS PM.  PT WILL BE OUT OF THE MEDICATION IN 5 DAYS.  PLEASE CK INTO THIS ASAP.

## 2012-08-04 ENCOUNTER — Telehealth: Payer: Self-pay | Admitting: Cardiovascular Disease

## 2012-08-04 MED ORDER — QUINAPRIL HCL 20 MG PO TABS: 20.0000 mg | ORAL_TABLET | Freq: Every day | ORAL | Status: DC

## 2012-08-04 NOTE — Telephone Encounter (Signed)
Pt's mail order was requested last week, not called in until Friday, now will be out before can receive refill, now needs 14 days worth at [facility] of quinapril 20mg  called in today will be going out of town tomorrow

## 2012-08-12 ENCOUNTER — Ambulatory Visit (INDEPENDENT_AMBULATORY_CARE_PROVIDER_SITE_OTHER): Payer: Medicare Other

## 2012-08-12 DIAGNOSIS — I4891 Unspecified atrial fibrillation: Secondary | ICD-10-CM

## 2012-08-12 DIAGNOSIS — Z7901 Long term (current) use of anticoagulants: Secondary | ICD-10-CM

## 2012-08-21 ENCOUNTER — Telehealth: Payer: Self-pay | Admitting: Cardiovascular Disease

## 2012-08-21 ENCOUNTER — Telehealth: Payer: Self-pay | Admitting: Pulmonary Disease

## 2012-08-21 MED ORDER — POTASSIUM CHLORIDE CRYS ER 20 MEQ PO TBCR: EXTENDED_RELEASE_TABLET | ORAL | Status: DC

## 2012-08-21 NOTE — Telephone Encounter (Signed)
Please return call to patient at [phone] regarding medication dosage.

## 2012-08-21 NOTE — Telephone Encounter (Signed)
PT NEEDED SCRIPT SENT TO PHARMACY  SENT VIA EP[facility] TO [facility]  PT KNOWS TO TAKE 1 TAB BID .Zack Seal

## 2012-08-21 NOTE — Telephone Encounter (Signed)
lmomtcb x1.. Shows this is refilled by cardiology

## 2012-08-21 NOTE — Telephone Encounter (Signed)
Spoke with pt. I advised to call cards for refill since they are the ones who prescribe this regularly. She verbalized understanding and was okay with this. Nothing further needed.

## 2012-08-22 DIAGNOSIS — H2510 Age-related nuclear cataract, unspecified eye: Secondary | ICD-10-CM | POA: Diagnosis not present

## 2012-09-23 ENCOUNTER — Ambulatory Visit (INDEPENDENT_AMBULATORY_CARE_PROVIDER_SITE_OTHER): Payer: Medicare Other

## 2012-09-23 DIAGNOSIS — Z7901 Long term (current) use of anticoagulants: Secondary | ICD-10-CM | POA: Diagnosis not present

## 2012-09-23 DIAGNOSIS — I4891 Unspecified atrial fibrillation: Secondary | ICD-10-CM | POA: Diagnosis not present

## 2012-09-23 LAB — POCT INR: INR: 2.2

## 2012-10-14 ENCOUNTER — Other Ambulatory Visit: Payer: Self-pay | Admitting: *Deleted

## 2012-10-14 ENCOUNTER — Encounter: Payer: Self-pay | Admitting: *Deleted

## 2012-10-14 MED ORDER — QUINAPRIL HCL 20 MG PO TABS: 20.0000 mg | ORAL_TABLET | Freq: Every day | ORAL | Status: DC

## 2012-10-15 ENCOUNTER — Ambulatory Visit (INDEPENDENT_AMBULATORY_CARE_PROVIDER_SITE_OTHER): Payer: Medicare Other | Admitting: Pulmonary Disease

## 2012-10-15 ENCOUNTER — Encounter: Payer: Self-pay | Admitting: Pulmonary Disease

## 2012-10-15 ENCOUNTER — Other Ambulatory Visit (INDEPENDENT_AMBULATORY_CARE_PROVIDER_SITE_OTHER): Payer: Medicare Other

## 2012-10-15 VITALS — BP 128/78 | HR 73 | Temp 96.7°F | Ht 71.0 in | Wt 197.0 lb

## 2012-10-15 DIAGNOSIS — I4891 Unspecified atrial fibrillation: Secondary | ICD-10-CM | POA: Diagnosis not present

## 2012-10-15 DIAGNOSIS — I059 Rheumatic mitral valve disease, unspecified: Secondary | ICD-10-CM

## 2012-10-15 DIAGNOSIS — E739 Lactose intolerance, unspecified: Secondary | ICD-10-CM

## 2012-10-15 DIAGNOSIS — M545 Low back pain: Secondary | ICD-10-CM

## 2012-10-15 DIAGNOSIS — I10 Essential (primary) hypertension: Secondary | ICD-10-CM

## 2012-10-15 DIAGNOSIS — I872 Venous insufficiency (chronic) (peripheral): Secondary | ICD-10-CM

## 2012-10-15 DIAGNOSIS — M810 Age-related osteoporosis without current pathological fracture: Secondary | ICD-10-CM

## 2012-10-15 DIAGNOSIS — F411 Generalized anxiety disorder: Secondary | ICD-10-CM

## 2012-10-15 DIAGNOSIS — E039 Hypothyroidism, unspecified: Secondary | ICD-10-CM

## 2012-10-15 DIAGNOSIS — E785 Hyperlipidemia, unspecified: Secondary | ICD-10-CM

## 2012-10-15 DIAGNOSIS — I739 Peripheral vascular disease, unspecified: Secondary | ICD-10-CM

## 2012-10-15 DIAGNOSIS — M1990 Unspecified osteoarthritis, unspecified site: Secondary | ICD-10-CM

## 2012-10-15 DIAGNOSIS — D126 Benign neoplasm of colon, unspecified: Secondary | ICD-10-CM

## 2012-10-15 DIAGNOSIS — C189 Malignant neoplasm of colon, unspecified: Secondary | ICD-10-CM

## 2012-10-15 DIAGNOSIS — G609 Hereditary and idiopathic neuropathy, unspecified: Secondary | ICD-10-CM

## 2012-10-15 LAB — BASIC METABOLIC PANEL
CO2: 29 mEq/L (ref 19–32)
Calcium: 9.3 mg/dL (ref 8.4–10.5)
Glucose, Bld: 114 mg/dL — ABNORMAL HIGH (ref 70–99)
Potassium: 5 mEq/L (ref 3.5–5.1)
Sodium: 138 mEq/L (ref 135–145)

## 2012-10-15 NOTE — Progress Notes (Signed)
Subjective:    Patient ID: [name], female    DOB: 1929-11-25, 78 y.o.   MRN: [id number]  HPI 78 y/o WF here for a follow up visit... she has multiple medical problems as listed below...   ~  2010-10-30:   her husb passed away 2010-06-30 then incr stress w/ his daughter, she has Therapist, sports for Prn use... she had abn Myoview 5/11, then cath 6/11 w/ essent norm coronaries, & 2DEcho showing norm LV, dilated LA & RV, AoV sclerosis & mild AI, MV thickening & mild MS & mod MR... she saw Walker Kehr 8/11- no changes made... also had colon 6/11 by DrPerry- divertics, 2 sm polyps, prev right hemicolectomy- path= tubular adenoma & f/u planned 55yrs.  ~  Apr 16, 2011:  3mo ROV & she is c/o lack of energy, feeling weak, nothing more specific; she had hammer toe surg (Art Dopplers LEs 2/12 per DrPetrinitz were normal) & hasn't been able to exercise- pin to be removed soon & asked to incr exerc program; she saw [facility] 12/11 for f/u HBP, AFib, edema, MV dis, Coumadin rx- stable, no changes made;  BP controlled on meds & labs show K=5.1 on Lasix, Aldactone & KCl- 4 daily (rec to decr to 2/d);  Chol is good on Lip20 but TG still sl incr & rec to get on a better low fat diet;  FBS=98 & A1c=6.6 on diet alone;  Other labs OK- see prob list below>>  ~  October 15, 2011:  3mo ROV & she has noted some mild vertigo, requests refill Meclizine- ok;  Reports otherw doing well, she has retired from her job at [employer];  BP controlled on meds- reviewed w/ pt;  She denies CP, palpit, SOB, syncope, cerebral ischemic symptoms, edema;  She had right foot surg by DrPetrinitz- pin removed and now able to ambulate, exercise better, etc;  BMet today looks good (pt on Lasix, Aldactone, and KCl w/ K=4.3)...  See prob list below>>    She saw Walker Kehr 6/12 for follow up AFib/ HBP/ Edema> asymptomatic, doing well, no changes made; she remains on Coumadin via the CC...  ~  Apr 14, 2012:  3mo ROV & Kimbley has had several visits w/ Walker Kehr  this spring for elev BP & palpit> he follows her for HBP, AFib, Valv heart dis, Edema;  2DEcho 5/13 showed norm LV size & function w/ EF=60-65%, calcif Aortic valve leaflets w/ mild AS& AI, severe LAdil, mild to mod MS & mild MR;  Holter Monitor showed chr AFib w/ rates 46-166; he increased Coreg to 6.25Bid & added Accupril20mg /d...    She had right foot hammertoe surg by DrPetrinitz 4/13     We reviewed prob list, meds, xrays & labs>  See below>>  CXR 5/13 showede borderline heart size, sl hyperinflation w/ lingular scarring, NAD... LABS 5/13:  FLP- at goals on Lip20;  Chems- ok w/ BS=110 A1c=6.4;  CBC- wnl;  TSH=1.52 on [id number];  B12=1074   ~  October 15, 2012:  3mo ROV & Shavelle reports that she is doing well, no new complaints or concerns; she is however concerned about her sis Ruby & we discussed this...     HBP> on Coreg6.25Bid, Accupril20, Lasix40, Aldactone25, K20Bid; BP=128/78 & she denies CP, palpit, SOB, edema; we rec incr exercise program...    AFib, Mitral valve dis> on Coumadin via CC; followed by Walker Kehr for Cards- seen 5/13 for her HBP, AFib, MS- stable, no changes made...    ASPVD> she  had mild to mod right RAS on arteriogram in 2008, normal ABIs but mild run-off dis in legs...    VI> known VI but doing well on Lasix, Aldactone, KCl; she knows to elim sodium, elev legs, etc...    Chol> on Lip20; FLP has been well controlled & 5/13= TChol 140, TG 88, HDL 48, LDL 74    Impaired GlucTol> on diet alone; BS has been ~110, and A1c~6.5; we reviewed low carb diet & wt reduction...    Hypothyroid> on Synthroid125; TSH has been well maintained on this dose...    Hx colon cancer & uterine ca> see below- she is up to date on f/u screening by DrPerry & DrNeal...    DJD, LBP> on Mobic7.5, Calcium, MVI, VitD; she had recent foot surg from DrPetrinitz...    Neuropathy> on B12 supplement; prev eval by Sula Soda, she is minimally symptomatic...    Anxiety> on Xanax0.25 prn... We reviewed prob list,  meds, xrays and labs> see below for updates >> she had the 2013 Flu vaccine in September... LABS 11/13:  Chems- ok x BS= 114...            Problem List:  HYPERTENSION (ICD-401.9) - controlled on COREG 6.25mg Bid, ACCUPRIL 20mg /d, LASIX 40mg /d, ALDACTONE 25mg /d & K20Bid... ~  labs 9/09 at Neuro showed K= 4.3, BUN= 15, Creat= 0.6 on Coreg3.125Bid, Accupril20, Lasix40, K20-2Bid. ~  labs 1/10 showed K= 4.8, BUN= 21, Creat= 0.6 ~  labs 5/10 showed K= 4.5, BUN= 17, Creat= 0.6..Marland Kitchen Continue same. ~  labs 5/11 showed K= 4.8, BUN= 19, Creat= 0.7; CXR- cardiomeg, mild hyperinflation, clear, NAD. ~  5/12:  BP= 124/72 today and similar at home unless stressed... denies HA, fatigue, visual changes, CP, palipit, dizziness, syncope, dyspnea, edema, etc... ~  Labs 5/12 showed K= 5.1, BUN= 21, Creat= 0.8; CXR- mild cardiomeg, clear, NAD; rec to decr K20 from 4/d to 2/d... ~  11/12:  BP= 112/74 & she remains asymptomatic; BMet remaiins wnl w/ K=4.3 ~  5/13:  BP= 118/64 & she she denies symptoms of CP, palpit, SOB, etc... ~  11/13:  BP= 128/78 & she remains largely asymptomatic...  ATRIAL FIBRILLATION (ICD-427.31) - on Coumadin per cardiology & followed in the Coumadin Clinic... MITRAL VALVE DISORDER (ICD-424.0) - followed by Walker Kehr & seen Q40mo... ? of PATENT FORAMEN OVALE (ICD-745.5) ~  NuclearStressTest 11/05 showed norm perfusion, mild apic thinning, no ischemia, EF=79%... ~  2DEcho 9/08 showed norm LVF, EF=55-60%, no regional wall motion abn, mod thickening of MV, mod MR, mod dil LA, ?PFO vs small ASD.Marland Kitchen. (prev echo's w/ mild MS)... ~  2DEcho 2/10 showed norm LVF w/o regional wall motion abn & EF= 55%, mild MS & mod MR (Rheumatic deformity), mod dil LA & RA, etc... ~  Myoview 5/11 showed  +ischemia & ST depression, EF= 77% ~  Cath 6/11 showed essent norm coronaries (min LAD plaque), dcalcif AoV, norm LVF, MR... ~  2DEcho 6/11 showed norm LVF, mild LVH, dil RV w/ mild decr RVF, dil LA, mild MV thickening w/  mild MS & mod MR, AoV sclerosis & no stenosis. ~  2DEcho 5/13 showed norm LV size & function w/ EF=60-65%, calcif Ao leaflets w/ mild AS& AI, severe LAdil, mild to mod MS & mild MR, Aorta is ok...  PERIPHERAL VASCULAR DISEASE (ICD-443.9) - tortuous Ao on CXR...  ~  CTAngio Abd&Legs 7/08 showed mild to mod right RAS, no signif PVD, mild run-off disease in legs... ~  LE Art Dopplers 2/12 showed normal  ABIs, normal toe indicies as well, biphasic & broadened PTA waveforms noted...  VENOUS INSUFFICIENCY (ICD-459.81) - on LASIX 40mg /d, ALDACTONE 25mg /d, + KCl - 2Bid... she follows a low sodium diet, elevates, support hose...  HYPERLIPIDEMIA (ICD-272.4) - on LIPITOR 20mg /d + diet Rx...  ~  FLP 5/08 showed TChol 100, TG 65, HDL 40, LDL 47 ~  FLP 3/09 showed TChol 114, TG 85, HDL 35, LDL 62 ~  FLP 9/09 at Neuro showed TChol 125, TG 118, HDL 34, LDL 67 ~  FLP 5/10 showed TChol 136, TG 127, HDL 36, LDL 74 ~  FLP 5/11 showed TChol 177, TG 167, HDL 51, LDL 93 ~  FLP 5/12 on Lip20 showed TChol 149, TG 168, HDL 47, LDL 69... rec same med, better low fat diet. ~  FLP 5/13 on Lip20 showed TChol 140, TG 88, HDL 48, LDL 74  IMPAIRED GLUCOSE TOLERANCE (ICD-271.3) - on diet  alone> see eval by Sula Soda, Neurology... ~  GTT 9/09 showed FBS= 101, 1hr BS= 216, A1c= 6.4 ~  labs 5/10 showed BS= 97, A1c= 6.7 ~  labs 5/11 showed BS= 107, A1c= 6.5 ~  Labs 5/12 showed BS= 98, A1c= 6.6.Marland KitchenMarland Kitchen Continue diet Rx. ~  Labs 5/13 showed BS= 110, A1c= 6.4  HYPOTHYROIDISM (ICD-244.9) - on SYNTHROID 167mcg/d... ~  TSH 6/08 was normal at 2.95 ~  TSH 3/09 was 2.18 ~  labs 9/09 at Neuro showed TSH= 0.62 ~  labs 5/10 showed TSH= 0.71 ~  labs 5/11 showed TSH= 0.31 ~  Labs 5/12 on Levothy125 showed TSH= 0.92... Continue same. ~  Labs 5/13 on Levo125 showed TSH= 1.52  Hx of COLON CANCER (ICD-153.9) - s/p right hemicolectomy in 1994... no known recurrence. ~  f/u colonoscopy 05/02/10 w/ DrPerry showed divertics, 2 sm polyps= tubular  adenoma, f/u planned 84yrs.  Hx of SPLENIC INFARCTION (ICD-289.59) - eval for left side pain 6/08 w/ CTAbd showing splenic infarct (she was on a coumadin alternative study drug) & switched to coumadin at that time...  UTERINE CANCER, HX OF (ICD-V10.42) - s/p hysterectomy, subseq AP repair 5/07 DrNeal... ~  She continues w/ annual GYN f/u DrNeal & she reports doing satis...  Hx of URINARY TRACT INFECTION (ICD-599.0)  OSTEOARTHRITIS (ICD-715.90) - known hand, shoulder, knee and CSpine arthritis as noted... takes MOBIC daily & TRAMODOL Prn... she requested appt at [facility] DrO'Rourke- seen 4/09 and he concurred w/ Mobic/ Tramadol for her Osteoarthritis... ~  prev eval DrDaldorf for Ortho 12/08 & 12/09 w/ left hand osteoarth (injected her middle finger PIP), right elbow DJD (improved after injection), left knee torn medial meniscus on MRI (improved after injection), and hx lumbar spinal stenosis treated by injection in the past... ~  MRI left knee 7/08 showed meniscus tear, effusion, & patellofemoral chondromalacia. ~  XRay left hand 02/16/08 showed DJD, no fractures... ~  Labs 3/09 showed  Hg=12.5, WBC=10.1, BMet=norm w/ K=4.3, sed=41, RA=neg, ANA=neg... ~ Additional labs 9/09 at [facility]- reviewed.  LOW BACK PAIN, CHRONIC (ICD-724.2) - s/p lumbar laminectomy L3-4 for sp stenosis 2006 by DrCram...   PERIPHERAL NEUROPATHY (ICD-356.9) - eval & Rx by DrYan in 2009> NCV's showed polyneuropathy: w/ abn GTT (1hr BS= 216, HgA1c= 6.4) and B12 level= 254; also found Sed=28, CRP=13, +Lyme titer...  placed on low carb diet & started on B12 shots, along w/ 5% Lidocaine cream to apply to feet at night...  OSTEOPOROSIS (ICD-733.00) - she takes Calcium supplement w/ D & MVI daily... she has osteoporosis w/ partial compression> BMDs  per GYN- DrNeal (we don't have copies & she's not on meds- "I take calcium & VitD"). ~  Labs 5/10 showed Vit D level = 50  ANXIETY (ICD-300.00) - on ALPRAZOLAM 0.25mg  Prn...  VITAMIN B12  DEFICIENCY (ICD-266.2) - eval by DrYan 9/09 showed B12 level= 254 (211-911)... started on B12 shots which she took for about 71mo & switched to OTC Vit B12 1000 daily... ~  labs 5/11 showed Vit B12 level = 506 (211-911) ~  Labs 5/12 showed Vit B12 level = 728 on B12 oral supplement daily. ~  Labs 5/13 showed B12 level = 1074 & ok to decr to 1/2 tab daily...   Past Surgical History  Procedure Date  . Hysterectomy for uterine cancer   . Anterior and posterior vaginal repair 03/2006    Dr.Neal  . Basal cell skin cancer moh's surgery   . Right hemicolectomy for colon cancer 1994  . Lumbar laminectomy for spinal stenosis 2006    L3-4  . Left carpal tunnel surgery 02/2007    Dr. [name]    Outpatient Encounter Prescriptions as of 10/15/2012  Medication Sig Dispense Refill  . ALPRAZolam (XANAX) 0.25 MG tablet Take 1 tablet (0.25 mg total) by mouth 3 (three) times daily as needed for anxiety. For anxiety  90 tablet  5  . atorvastatin (LIPITOR) 20 MG tablet Take 1 tablet (20 mg total) by mouth daily.  90 tablet  3  . Calcium Carbonate-Vitamin D (CALTRATE 600+D) 600-400 MG-UNIT per tablet Take 1 tablet by mouth daily.        . carvedilol (COREG) 6.25 MG tablet Take 1 tablet (6.25 mg total) by mouth 2 (two) times daily.  180 tablet  3  . estradiol (CLIMARA - DOSED IN MG/24 HR) 0.1 mg/24hr Place 1 patch onto the skin once a week.        . furosemide (LASIX) 40 MG tablet Take 1 tablet (40 mg total) by mouth daily.  90 tablet  3  . levothyroxine (SYNTHROID, LEVOTHROID) 125 MCG tablet Take 1 tablet (125 mcg total) by mouth daily.  90 tablet  4  . meclizine (ANTIVERT) 25 MG tablet Take 1/2 to 1 tablet by mouth every 6 hours as needed for dizziness   30 tablet  11  . meloxicam (MOBIC) 7.5 MG tablet Take 1 tablet (7.5 mg total) by mouth daily.  90 tablet  3  . Multiple Vitamins-Minerals (CENTRUM SILVER PO) Take 1 tablet by mouth daily.        . potassium chloride SA (KLOR-CON M20) 20 MEQ tablet 2  TABS BID  120 tablet  6  . quinapril (ACCUPRIL) 20 MG tablet Take 1 tablet (20 mg total) by mouth daily.  90 tablet  3  . spironolactone (ALDACTONE) 25 MG tablet TAKE 1 TABLET EVERY DAY  30 tablet  5  . vitamin B-12 (CYANOCOBALAMIN) 1000 MCG tablet Take 1,000 mcg by mouth daily.        Marland Kitchen warfarin (COUMADIN) 5 MG tablet Take as directed by Anticoagulation clinic  120 tablet  1    Allergies  Allergen Reactions  . Codeine     REACTION: nausea    Current Medications, Allergies, Past Medical History, Past Surgical History, Family History, and Social History were reviewed in [employer] record.   Review of Systems        See HPI - all other systems neg except as noted... The patient complains of dyspnea on exertion and peripheral edema.  The patient  denies anorexia, fever, weight loss, weight gain, vision loss, decreased hearing, hoarseness, chest pain, syncope, prolonged cough, headaches, hemoptysis, abdominal pain, melena, hematochezia, severe indigestion/heartburn, hematuria, incontinence, muscle weakness, suspicious skin lesions, transient blindness, difficulty walking, depression, unusual weight change, abnormal bleeding, enlarged lymph nodes, and angioedema.     Objective:   Physical Exam     WD, WN, 78 y/o WF in NAD... VITAL SIGNS:  Reviewed... GENERAL:  Alert & oriented; pleasant & cooperative... HEENT:  [place]/AT, EOM-wnl, PERRLA, EACs-clear, TMs-wnl, NOSE-clear, THROAT-clear & wnl. NECK:  Supple w/ fair ROM; no JVD; normal carotid impulses w/o bruits; no thyromegaly or nodules palpated; no lymphadenopathy. CHEST:  Clear to P & A; without wheezes/ rales/ or rhonchi. HEART:  Regular rhythm, gr 1/6 SEM; without rubs/ or gallops heard... ABDOMEN:  Soft & nontender; normal bowel sounds; no organomegaly or masses detected. EXT: without deformities, mod arthritic changes; no varicose veins/ +venous insuffic/ tr edema. NEURO:  CN's intact;  no focal neuro deficits x  distal neuropathy symptoms... DERM:  No lesions noted; no rash etc...  RADIOLOGY DATA:  Reviewed in the EPIC EMR & discussed w/ the patient...  LABORATORY DATA:  Reviewed in the EPIC EMR & discussed w/ the patient...   Assessment & Plan:    HBP>  Controlled on her current med rgimen; continue same, low sodium, keep wt down...  Cardiac> AFib, Mitral Valve Dis, ?PFO, Coumadin via CC> followed by Walker Kehr & stable, continue same meds...  ASPVD> known mod right RAS, mild run-off dis in LEs, & recent ArtDopplers reviewed...  Ven Insuffic> aware- she knows to elim sodium, elev legs, wear support hose when able...  CHOL>  Stable on Lip20, but needs better low fat diet...  BorderlineDM> impaired gluc tolerance on diet alone, A1c= 6.6  HYPOTHY>  Stable on Levothy125 daily...  Hx Colon Cancer> surg 1994, no known recurrence & up to date on screening per DrPerry...   DJD>  As noted- stable on Mobic, Tramadol; followed by DrDaldorf...  Neuropathy>  eval & rx per Neuro, DrYan; she is improved...  Osteoporosis>  ?when last BMD> followed by GYN, asked to get copies for our records...  Other medical issues as noted...   Patient's Medications  New Prescriptions   No medications on file  Previous Medications   ALPRAZOLAM (XANAX) 0.25 MG TABLET    Take 1 tablet (0.25 mg total) by mouth 3 (three) times daily as needed for anxiety. For anxiety   ATORVASTATIN (LIPITOR) 20 MG TABLET    Take 1 tablet (20 mg total) by mouth daily.   CALCIUM CARBONATE-VITAMIN D (CALTRATE 600+D) 600-400 MG-UNIT PER TABLET    Take 1 tablet by mouth daily.     CARVEDILOL (COREG) 6.25 MG TABLET    Take 1 tablet (6.25 mg total) by mouth 2 (two) times daily.   ESTRADIOL (CLIMARA - DOSED IN MG/24 HR) 0.1 MG/24HR    Place 1 patch onto the skin once a week.     FUROSEMIDE (LASIX) 40 MG TABLET    Take 1 tablet (40 mg total) by mouth daily.   LEVOTHYROXINE (SYNTHROID, LEVOTHROID) 125 MCG TABLET    Take 1 tablet (125 mcg  total) by mouth daily.   MECLIZINE (ANTIVERT) 25 MG TABLET    Take 1/2 to 1 tablet by mouth every 6 hours as needed for dizziness    MELOXICAM (MOBIC) 7.5 MG TABLET    Take 1 tablet (7.5 mg total) by mouth daily.   MULTIPLE VITAMINS-MINERALS (CENTRUM SILVER PO)  Take 1 tablet by mouth daily.     POTASSIUM CHLORIDE SA (KLOR-CON M20) 20 MEQ TABLET    2 TABS BID   QUINAPRIL (ACCUPRIL) 20 MG TABLET    Take 1 tablet (20 mg total) by mouth daily.   SPIRONOLACTONE (ALDACTONE) 25 MG TABLET    TAKE 1 TABLET EVERY DAY   VITAMIN B-12 (CYANOCOBALAMIN) 1000 MCG TABLET    Take 1,000 mcg by mouth daily.     WARFARIN (COUMADIN) 5 MG TABLET    Take as directed by Anticoagulation clinic  Modified Medications   No medications on file  Discontinued Medications   No medications on file

## 2012-10-15 NOTE — Patient Instructions (Addendum)
Today we updated your med list in our EPIC system...    Continue your current medications the same...  Today we did your Metabolic/ diabetic labs...    We will contact you w/ the results when avail...  Call for any questions...  Let's continue our 6 month follow up visits (w/ FASTING blood work in the spring).Marland KitchenMarland Kitchen

## 2012-10-21 ENCOUNTER — Telehealth: Payer: Self-pay | Admitting: Pulmonary Disease

## 2012-10-21 NOTE — Telephone Encounter (Signed)
Called and spoke with patient, informed her of results/recs as lsited below per Dr. Kriste Basque.  Patient verbalized understanding and nothing further needed at this time.  Notes Recorded by Michele Mcalpine, MD on 10/16/2012 at 10:02 AM Please notify patient>  Chems are ok, continue same meds.Marland KitchenMarland Kitchen

## 2012-10-21 NOTE — Progress Notes (Signed)
Quick Note:  Called and spoke with patient, informed her of results as listed below per Dr. Kriste Basque. Patient verbalized understanding and nothing further needed at this time. ______

## 2012-10-30 ENCOUNTER — Telehealth: Payer: Self-pay | Admitting: Cardiovascular Disease

## 2012-10-30 NOTE — Telephone Encounter (Signed)
[facility]  [phone]

## 2012-11-04 ENCOUNTER — Ambulatory Visit (INDEPENDENT_AMBULATORY_CARE_PROVIDER_SITE_OTHER): Payer: Medicare Other

## 2012-11-04 DIAGNOSIS — I4891 Unspecified atrial fibrillation: Secondary | ICD-10-CM | POA: Diagnosis not present

## 2012-11-04 DIAGNOSIS — Z7901 Long term (current) use of anticoagulants: Secondary | ICD-10-CM | POA: Diagnosis not present

## 2012-11-12 MED ORDER — QUINAPRIL HCL 20 MG PO TABS: 20.0000 mg | ORAL_TABLET | Freq: Every day | ORAL | Status: DC

## 2012-11-12 NOTE — Telephone Encounter (Signed)
Follow-up:    Patient called in again to receive a refill of her quinapril (ACCUPRIL) 20 MG tablet.

## 2012-11-12 NOTE — Telephone Encounter (Signed)
Called pt notifying her as requested her quinapril (ACCUPRIL) 20 MG tablet QTY: 90 with 3 refills was sent to [facility].  Caralee Ates, CMA

## 2012-12-08 ENCOUNTER — Telehealth: Payer: Self-pay | Admitting: Pulmonary Disease

## 2012-12-08 MED ORDER — MELOXICAM 7.5 MG PO TABS: 7.5000 mg | ORAL_TABLET | Freq: Every day | ORAL | Status: DC

## 2012-12-08 NOTE — Telephone Encounter (Signed)
Pt aware rx has been sent. Nothing further was needed 

## 2012-12-16 ENCOUNTER — Ambulatory Visit (INDEPENDENT_AMBULATORY_CARE_PROVIDER_SITE_OTHER): Payer: Medicare Other

## 2012-12-16 DIAGNOSIS — Z7901 Long term (current) use of anticoagulants: Secondary | ICD-10-CM | POA: Diagnosis not present

## 2012-12-16 DIAGNOSIS — I4891 Unspecified atrial fibrillation: Secondary | ICD-10-CM | POA: Diagnosis not present

## 2012-12-16 LAB — POCT INR: INR: 2.2

## 2013-01-12 ENCOUNTER — Other Ambulatory Visit: Payer: Self-pay

## 2013-01-12 MED ORDER — SPIRONOLACTONE 25 MG PO TABS: ORAL_TABLET | ORAL | Status: DC

## 2013-01-13 ENCOUNTER — Other Ambulatory Visit: Payer: Self-pay | Admitting: *Deleted

## 2013-01-13 MED ORDER — SPIRONOLACTONE 25 MG PO TABS: ORAL_TABLET | ORAL | Status: DC

## 2013-01-27 ENCOUNTER — Ambulatory Visit (INDEPENDENT_AMBULATORY_CARE_PROVIDER_SITE_OTHER): Payer: Medicare Other

## 2013-01-27 DIAGNOSIS — I4891 Unspecified atrial fibrillation: Secondary | ICD-10-CM

## 2013-01-27 DIAGNOSIS — Z7901 Long term (current) use of anticoagulants: Secondary | ICD-10-CM | POA: Diagnosis not present

## 2013-01-27 LAB — POCT INR: INR: 2

## 2013-02-16 ENCOUNTER — Telehealth: Payer: Self-pay | Admitting: *Deleted

## 2013-02-16 DIAGNOSIS — I4891 Unspecified atrial fibrillation: Secondary | ICD-10-CM

## 2013-02-16 DIAGNOSIS — I10 Essential (primary) hypertension: Secondary | ICD-10-CM

## 2013-02-16 MED ORDER — WARFARIN SODIUM 5 MG PO TABS: ORAL_TABLET | ORAL | Status: DC

## 2013-02-16 MED ORDER — FUROSEMIDE 40 MG PO TABS: 40.0000 mg | ORAL_TABLET | Freq: Every day | ORAL | Status: DC

## 2013-02-16 MED ORDER — CARVEDILOL 6.25 MG PO TABS: 6.2500 mg | ORAL_TABLET | Freq: Two times a day (BID) | ORAL | Status: DC

## 2013-02-16 NOTE — Telephone Encounter (Signed)
Rx for warfarin sent to [facility]

## 2013-02-16 NOTE — Telephone Encounter (Signed)
Pt needs warfain refilled

## 2013-03-05 ENCOUNTER — Encounter: Payer: Self-pay | Admitting: Cardiovascular Disease

## 2013-03-05 ENCOUNTER — Ambulatory Visit (INDEPENDENT_AMBULATORY_CARE_PROVIDER_SITE_OTHER): Payer: Medicare Other

## 2013-03-05 ENCOUNTER — Ambulatory Visit (INDEPENDENT_AMBULATORY_CARE_PROVIDER_SITE_OTHER): Payer: Medicare Other | Admitting: Cardiovascular Disease

## 2013-03-05 VITALS — BP 132/80 | HR 64

## 2013-03-05 DIAGNOSIS — Z7901 Long term (current) use of anticoagulants: Secondary | ICD-10-CM | POA: Diagnosis not present

## 2013-03-05 DIAGNOSIS — I872 Venous insufficiency (chronic) (peripheral): Secondary | ICD-10-CM

## 2013-03-05 DIAGNOSIS — I4891 Unspecified atrial fibrillation: Secondary | ICD-10-CM

## 2013-03-05 DIAGNOSIS — E785 Hyperlipidemia, unspecified: Secondary | ICD-10-CM | POA: Diagnosis not present

## 2013-03-05 DIAGNOSIS — I10 Essential (primary) hypertension: Secondary | ICD-10-CM

## 2013-03-05 LAB — POCT INR: INR: 1.9

## 2013-03-05 NOTE — Patient Instructions (Signed)
Your physician wants you to follow-up in:  6 MONTHS WITH DR NISHAN  You will receive a reminder letter in the mail two months in advance. If you don't receive a letter, please call our office to schedule the follow-up appointment. Your physician recommends that you continue on your current medications as directed. Please refer to the Current Medication list given to you today. 

## 2013-03-05 NOTE — Progress Notes (Signed)
Patient ID: [name], female   DOB: [birth year], 79 y.o.   MRN: [id number] [name] is seen today for f/U of afib, HTN, and edema. Her coumadin has been Rx. No palpitations, SOB or SSCP. She is walking on a regular basis without symptoms. She retired from [employer] a year ago. Her husband [name] died and she is now living in apartment near [employer] Aldactone has worked well as a diuretic for edema as she requires much less K. She had a splenic infarct when enrolled in the engage trial and we have not thought about changing her to Pradaxa  Recent right foot surgery by Petrinitz.  BP and pulse seem to be better today  Last visit beta blocker increased BP better but with less  dyspnea and palpitations. Started back doing water aerobics at the [facility] Working at [employer] part time again  ROS: Denies fever, malais, weight loss, blurry vision, decreased visual acuity, cough, sputum, SOB, hemoptysis, pleuritic pain, palpitaitons, heartburn, abdominal pain, melena, lower extremity edema, claudication, or rash.  All other systems reviewed and negative  General: Affect appropriate Healthy:  appears stated age HEENT: normal Neck supple with no adenopathy JVP normal no bruits no thyromegaly Lungs clear with no wheezing and good diaphragmatic motion Heart:  S1/S2 no murmur, no rub, gallop or click PMI normal Abdomen: benighn, BS positve, no tenderness, no AAA no bruit.  No HSM or HJR Distal pulses intact with no bruits No edema Neuro non-focal Skin warm and dry No muscular weakness   Current Outpatient Prescriptions  Medication Sig Dispense Refill  . atorvastatin (LIPITOR) 20 MG tablet Take 1 tablet (20 mg total) by mouth daily.  90 tablet  3  . Calcium Carbonate-Vitamin D (CALTRATE 600+D) 600-400 MG-UNIT per tablet Take 1 tablet by mouth daily.        . carvedilol (COREG) 6.25 MG tablet Take 1 tablet (6.25 mg total) by mouth 2 (two) times daily.  180 tablet  3  . estradiol (CLIMARA - DOSED IN MG/24  HR) 0.1 mg/24hr Place 1 patch onto the skin once a week.        . furosemide (LASIX) 40 MG tablet Take 1 tablet (40 mg total) by mouth daily.  90 tablet  3  . levothyroxine (SYNTHROID, LEVOTHROID) 125 MCG tablet Take 1 tablet (125 mcg total) by mouth daily.  90 tablet  4  . meloxicam (MOBIC) 7.5 MG tablet Take 1 tablet (7.5 mg total) by mouth daily.  90 tablet  3  . Multiple Vitamins-Minerals (CENTRUM SILVER PO) Take 1 tablet by mouth daily.        . potassium chloride SA (KLOR-CON M20) 20 MEQ tablet 2 TABS BID  120 tablet  6  . quinapril (ACCUPRIL) 20 MG tablet Take 1 tablet (20 mg total) by mouth daily.  90 tablet  3  . spironolactone (ALDACTONE) 25 MG tablet TAKE 1 TABLET EVERY DAY  90 tablet  3  . vitamin B-12 (CYANOCOBALAMIN) 1000 MCG tablet Take 1,000 mcg by mouth daily.        Marland Kitchen warfarin (COUMADIN) 5 MG tablet Take as directed by Anticoagulation clinic  120 tablet  1   No current facility-administered medications for this visit.    Allergies  Codeine  Electrocardiogram:  afib rate 64 nonspecific inferolateral ST/T wave changes no change when compared to 04/08/12  Assessment and Plan

## 2013-03-05 NOTE — Assessment & Plan Note (Signed)
Stable Continue current dose of diuretic

## 2013-03-05 NOTE — Assessment & Plan Note (Signed)
Cholesterol is at goal.  Continue current dose of statin and diet Rx.  No myalgias or side effects.  F/U  LFT's in 6 months. Lab Results  Component Value Date   LDLCALC 74 04/14/2012

## 2013-03-05 NOTE — Assessment & Plan Note (Signed)
Well controlled.  Continue current medications and low sodium Dash type diet.    

## 2013-03-05 NOTE — Assessment & Plan Note (Signed)
Good rate control and anticoagulation F/U coumadin clinic 

## 2013-03-17 DIAGNOSIS — Z1212 Encounter for screening for malignant neoplasm of rectum: Secondary | ICD-10-CM | POA: Diagnosis not present

## 2013-03-17 DIAGNOSIS — M949 Disorder of cartilage, unspecified: Secondary | ICD-10-CM | POA: Diagnosis not present

## 2013-03-17 DIAGNOSIS — Z124 Encounter for screening for malignant neoplasm of cervix: Secondary | ICD-10-CM | POA: Diagnosis not present

## 2013-03-17 DIAGNOSIS — M899 Disorder of bone, unspecified: Secondary | ICD-10-CM | POA: Diagnosis not present

## 2013-03-17 DIAGNOSIS — Z1231 Encounter for screening mammogram for malignant neoplasm of breast: Secondary | ICD-10-CM | POA: Diagnosis not present

## 2013-03-17 DIAGNOSIS — Z13820 Encounter for screening for osteoporosis: Secondary | ICD-10-CM | POA: Diagnosis not present

## 2013-03-17 DIAGNOSIS — Z130 Encounter for screening for diseases of the blood and blood-forming organs and certain disorders involving the immune mechanism: Secondary | ICD-10-CM | POA: Diagnosis not present

## 2013-03-25 ENCOUNTER — Telehealth: Payer: Self-pay | Admitting: Pulmonary Disease

## 2013-03-25 NOTE — Telephone Encounter (Signed)
Received 2 pages from [facility]. 03/25/13/ss

## 2013-04-15 ENCOUNTER — Ambulatory Visit (INDEPENDENT_AMBULATORY_CARE_PROVIDER_SITE_OTHER): Payer: Medicare Other | Admitting: *Deleted

## 2013-04-15 ENCOUNTER — Other Ambulatory Visit (INDEPENDENT_AMBULATORY_CARE_PROVIDER_SITE_OTHER): Payer: Medicare Other

## 2013-04-15 ENCOUNTER — Encounter: Payer: Self-pay | Admitting: Pulmonary Disease

## 2013-04-15 ENCOUNTER — Ambulatory Visit (INDEPENDENT_AMBULATORY_CARE_PROVIDER_SITE_OTHER): Payer: Medicare Other | Admitting: Pulmonary Disease

## 2013-04-15 VITALS — BP 128/78 | HR 68 | Temp 98.6°F | Ht 70.5 in | Wt 195.6 lb

## 2013-04-15 DIAGNOSIS — M810 Age-related osteoporosis without current pathological fracture: Secondary | ICD-10-CM

## 2013-04-15 DIAGNOSIS — I739 Peripheral vascular disease, unspecified: Secondary | ICD-10-CM | POA: Diagnosis not present

## 2013-04-15 DIAGNOSIS — Z7901 Long term (current) use of anticoagulants: Secondary | ICD-10-CM | POA: Diagnosis not present

## 2013-04-15 DIAGNOSIS — F411 Generalized anxiety disorder: Secondary | ICD-10-CM

## 2013-04-15 DIAGNOSIS — E559 Vitamin D deficiency, unspecified: Secondary | ICD-10-CM

## 2013-04-15 DIAGNOSIS — E739 Lactose intolerance, unspecified: Secondary | ICD-10-CM

## 2013-04-15 DIAGNOSIS — E785 Hyperlipidemia, unspecified: Secondary | ICD-10-CM | POA: Diagnosis not present

## 2013-04-15 DIAGNOSIS — I4891 Unspecified atrial fibrillation: Secondary | ICD-10-CM

## 2013-04-15 DIAGNOSIS — Z8542 Personal history of malignant neoplasm of other parts of uterus: Secondary | ICD-10-CM

## 2013-04-15 DIAGNOSIS — I10 Essential (primary) hypertension: Secondary | ICD-10-CM

## 2013-04-15 DIAGNOSIS — M545 Low back pain: Secondary | ICD-10-CM

## 2013-04-15 DIAGNOSIS — D126 Benign neoplasm of colon, unspecified: Secondary | ICD-10-CM

## 2013-04-15 DIAGNOSIS — I872 Venous insufficiency (chronic) (peripheral): Secondary | ICD-10-CM

## 2013-04-15 DIAGNOSIS — E039 Hypothyroidism, unspecified: Secondary | ICD-10-CM

## 2013-04-15 DIAGNOSIS — G609 Hereditary and idiopathic neuropathy, unspecified: Secondary | ICD-10-CM

## 2013-04-15 DIAGNOSIS — M1990 Unspecified osteoarthritis, unspecified site: Secondary | ICD-10-CM

## 2013-04-15 LAB — CBC WITH DIFFERENTIAL/PLATELET
Basophils Absolute: 0 10*3/uL (ref 0.0–0.1)
Eosinophils Absolute: 0.3 10*3/uL (ref 0.0–0.7)
Lymphocytes Relative: 23.2 % (ref 12.0–46.0)
MCHC: 34.3 g/dL (ref 30.0–36.0)
Neutrophils Relative %: 66.5 % (ref 43.0–77.0)
Platelets: 250 10*3/uL (ref 150.0–400.0)
RDW: 12.6 % (ref 11.5–14.6)

## 2013-04-15 LAB — HEPATIC FUNCTION PANEL
ALT: 22 U/L (ref 0–35)
Albumin: 4.3 g/dL (ref 3.5–5.2)
Alkaline Phosphatase: 58 U/L (ref 39–117)
Bilirubin, Direct: 0.2 mg/dL (ref 0.0–0.3)
Total Protein: 7.6 g/dL (ref 6.0–8.3)

## 2013-04-15 LAB — HEMOGLOBIN A1C: Hgb A1c MFr Bld: 6.5 % (ref 4.6–6.5)

## 2013-04-15 LAB — LIPID PANEL
Total CHOL/HDL Ratio: 4
Triglycerides: 160 mg/dL — ABNORMAL HIGH (ref 0.0–149.0)

## 2013-04-15 LAB — BASIC METABOLIC PANEL
CO2: 27 mEq/L (ref 19–32)
Chloride: 106 mEq/L (ref 96–112)
Creatinine, Ser: 0.9 mg/dL (ref 0.4–1.2)
Glucose, Bld: 105 mg/dL — ABNORMAL HIGH (ref 70–99)
Sodium: 140 mEq/L (ref 135–145)

## 2013-04-15 LAB — TSH: TSH: 1.25 u[IU]/mL (ref 0.35–5.50)

## 2013-04-15 MED ORDER — ATORVASTATIN CALCIUM 20 MG PO TABS: 20.0000 mg | ORAL_TABLET | Freq: Every day | ORAL | Status: DC

## 2013-04-15 NOTE — Patient Instructions (Addendum)
Today we updated your med list in our EPIC system...    Continue your current medications the same...    We refilled there meds you requested...  Today we did your follow up FASTING blood work...    We will contact you w/ the results when available...   Keep up the good work w/ diet & EXERCISE ("walking Miss Daisy")  Call for any questions...  Let's plan a follow up visit in 65mo, sooner if needed for problems.Marland KitchenMarland Kitchen

## 2013-04-15 NOTE — Progress Notes (Signed)
Subjective:    Patient ID: [name], female    DOB: 1929-04-12, 79 y.o.   MRN: [id number]  HPI 79 y/o WF here for a follow up visit... she has multiple medical problems as listed below...   ~  October 15, 2011:  3244mo ROV & she has noted some mild vertigo, requests refill Meclizine- ok;  Reports otherw doing well, she has retired from her job at [employer];  BP controlled on meds- reviewed w/ pt;  She denies CP, palpit, SOB, syncope, cerebral ischemic symptoms, edema;  She had right foot surg by DrPetrinitz- pin removed and now able to ambulate, exercise better, etc;  BMet today looks good (pt on Lasix, Aldactone, and KCl w/ K=4.3)...  See prob list below>>    She saw Walker Kehr 6/12 for follow up AFib/ HBP/ Edema> asymptomatic, doing well, no changes made; she remains on Coumadin via the CC...  ~  Apr 14, 2012:  3244mo ROV & [name] has had several visits w/ Walker Kehr this spring for elev BP & palpit> he follows her for HBP, AFib, Valv heart dis, Edema;  2DEcho 5/13 showed norm LV size & function w/ EF=60-65%, calcif Aortic valve leaflets w/ mild AS& AI, severe LAdil, mild to mod MS & mild MR;  Holter Monitor showed chr AFib w/ rates 46-166; he increased Coreg to 6.25Bid & added Accupril20mg /d...    She had right foot hammertoe surg by DrPetrinitz 4/13     We reviewed prob list, meds, xrays & labs>  See below>>  CXR 5/13 showede borderline heart size, sl hyperinflation w/ lingular scarring, NAD... LABS 5/13:  FLP- at goals on Lip20;  Chems- ok w/ BS=110 A1c=6.4;  CBC- wnl;  TSH=1.52 on [id number];  B12=1074   ~  October 15, 2012:  3244mo ROV & [place] reports that she is doing well, no new complaints or concerns; she is however concerned about her sis Ruby & we discussed this...     HBP> on Coreg6.25Bid, Accupril20, Lasix40, Aldactone25, K20Bid; BP=128/78 & she denies CP, palpit, SOB, edema; we rec incr exercise program...    AFib, Mitral valve dis> on Coumadin via CC; followed by Walker Kehr for Cards- seen 5/13  for her HBP, AFib, MS- stable, no changes made...    ASPVD> she had mild to mod right RAS on arteriogram in 2008, normal ABIs but mild run-off dis in legs...    VI> known VI but doing well on Lasix, Aldactone, KCl; she knows to elim sodium, elev legs, etc...    Chol> on Lip20; FLP has been well controlled & 5/13= TChol 140, TG 88, HDL 48, LDL 74    Impaired GlucTol> on diet alone; BS has been ~110, and A1c~6.5; we reviewed low carb diet & wt reduction...    Hypothyroid> on Synthroid125; TSH has been well maintained on this dose...    Hx colon cancer & uterine ca> see below- she is up to date on f/u screening by DrPerry & DrNeal...    DJD, LBP> on Mobic7.5, Calcium, MVI, VitD; she had recent foot surg from DrPetrinitz...    Neuropathy> on B12 supplement; prev eval by Sula Soda, she is minimally symptomatic...    Anxiety> on Xanax0.25 prn... We reviewed prob list, meds, xrays and labs> see below for updates >> she had the 2013 Flu vaccine in September... LABS 11/13:  Chems- ok x BS= 114...    ~  Apr 15, 2013:  3244mo ROV & [name] has had a good interval w/o new complaints or concerns;  We reviewed the following medical problems during today's office visit >>     HBP> on Coreg6.25Bid, Accupril20, Lasix40, Aldactone25, K20Bid; BP=128/78 & she denies CP, palpit, SOB, edema; we rec incr exercise program...    AFib, Mitral valve dis> on Coumadin via CC; followed by Walker Kehr for Cards- seen 4/14 for her HBP, AFib, MS- stable, no changes made...    ASPVD> she had mild to mod right RAS on arteriogram in 2008, normal ABIs but mild run-off dis in legs...    VI> known VI but doing well on Lasix, Aldactone, KCl; she knows to elim sodium, elev legs, etc...    Chol> on Lip20; FLP has been well controlled & 5/14= TChol 160, TG 160, HDL 45, LDL 84 and we stressed low fat diet...    Impaired GlucTol> on diet alone; BS has been ~110, and A1c~6.5; we reviewed low carb diet & wt reduction...    Hypothyroid> on Synthroid125;  TSH has been well maintained on this dose...    Hx colon cancer & uterine ca> see below- she is up to date on f/u screening by DrPerry & DrNeal...    DJD, LBP> on Mobic7.5, Calcium, MVI, VitD; she had foot surg from DrPetrinitz...    Neuropathy> on B12 supplement; prev eval by Sula Soda, she is minimally symptomatic...    Anxiety> on Xanax0.25 prn... We reviewed prob list, meds, xrays and labs> see below for updates >>  LABS 5/14:  FLP- at goals on Lip20 x TG=160;  Chems- ok x BS=105 A1c=6.5;  CBC- wnl;  TSH=1.25 on [id number];  VitD=51           Problem List:  HYPERTENSION (ICD-401.9) - controlled on COREG 6.25mg Bid, ACCUPRIL 20mg /d, LASIX 40mg /d, ALDACTONE 25mg /d & K20Bid... ~  labs 9/09 at Neuro showed K= 4.3, BUN= 15, Creat= 0.6 on Coreg3.125Bid, Accupril20, Lasix40, K20-2Bid. ~  labs 1/10 showed K= 4.8, BUN= 21, Creat= 0.6 ~  labs 5/10 showed K= 4.5, BUN= 17, Creat= 0.6..Marland Kitchen Continue same. ~  labs 5/11 showed K= 4.8, BUN= 19, Creat= 0.7; CXR- cardiomeg, mild hyperinflation, clear, NAD. ~  5/12:  BP= 124/72 today and similar at home unless stressed... denies HA, fatigue, visual changes, CP, palipit, dizziness, syncope, dyspnea, edema, etc... ~  Labs 5/12 showed K= 5.1, BUN= 21, Creat= 0.8; CXR- mild cardiomeg, clear, NAD; rec to decr K20 from 4/d to 2/d... ~  11/12:  BP= 112/74 & she remains asymptomatic; BMet remaiins wnl w/ K=4.3 ~  5/13:  BP= 118/64 & she she denies symptoms of CP, palpit, SOB, etc;  CXR 5/13 showed borderline heart size, COPD w/ linear scarring in [place], NAD... ~  11/13:  BP= 128/78 & she remains largely asymptomatic...  ATRIAL FIBRILLATION (ICD-427.31) - on Coumadin per cardiology & followed in the [facility]madin [facility]... MITRAL VALVE DISORDER (ICD-424.0) - followed by Walker Kehr & seen Q46mo... ? of PATENT FORAMEN OVALE (ICD-745.5) ~  NuclearStressTest 11/05 showed norm perfusion, mild apic thinning, no ischemia, EF=79%... ~  2DEcho 9/08 showed norm LVF, EF=55-60%, no regional  wall motion abn, mod thickening of MV, mod MR, mod dil LA, ?PFO vs small ASD.Marland Kitchen. (prev echo's w/ mild MS)... ~  2DEcho 2/10 showed norm LVF w/o regional wall motion abn & EF= 55%, mild MS & mod MR (Rheumatic deformity), mod dil LA & RA, etc... ~  Myoview 5/11 showed  +ischemia & ST depression, EF= 77% ~  Cath 6/11 showed essent norm coronaries (min LAD plaque), dcalcif AoV, norm LVF, MR... ~  2DEcho 6/11 showed  norm LVF, mild LVH, dil RV w/ mild decr RVF, dil LA, mild MV thickening w/ mild MS & mod MR, AoV sclerosis & no stenosis. ~  LifeWatch Monitor 4/13 showed chronic AFib, ave HR=67 ~  2DEcho 5/13 showed norm LV size & function w/ EF=60-65%, calcif Ao leaflets w/ mild AS& AI, severe LAdil, mild to mod MS & mild MR, Aorta is ok... ~  4/14: She had f/u DrNishan> AFib, HBP, ValvDis, edema- on Coumadin, asymptomatic, doing satis & no changes made; EKG 4/14 showed AFib, rate64, NSSTTWA...  PERIPHERAL VASCULAR DISEASE (ICD-443.9) - tortuous Ao on CXR...  ~  CTAngio Abd&Legs 7/08 showed mild to mod right RAS, no signif PVD, mild run-off disease in legs... ~  LE Art Dopplers 2/12 showed normal ABIs, normal toe indicies as well, biphasic & broadened PTA waveforms noted...  VENOUS INSUFFICIENCY (ICD-459.81) - on LASIX 40mg /d, ALDACTONE 25mg /d, + KCl - 2Bid... she follows a low sodium diet, elevates, support hose...  HYPERLIPIDEMIA (ICD-272.4) - on LIPITOR 20mg /d + diet Rx...  ~  FLP 5/08 showed TChol 100, TG 65, HDL 40, LDL 47 ~  FLP 3/09 showed TChol 114, TG 85, HDL 35, LDL 62 ~  FLP 9/09 at Neuro showed TChol 125, TG 118, HDL 34, LDL 67 ~  FLP 5/10 showed TChol 136, TG 127, HDL 36, LDL 74 ~  FLP 5/11 showed TChol 177, TG 167, HDL 51, LDL 93 ~  FLP 5/12 on Lip20 showed TChol 149, TG 168, HDL 47, LDL 69... rec same med, better low fat diet. ~  FLP 5/13 on Lip20 showed TChol 140, TG 88, HDL 48, LDL 74  IMPAIRED GLUCOSE TOLERANCE (ICD-271.3) - on diet  alone> see eval by Sula Soda, Neurology... ~   GTT 9/09 showed FBS= 101, 1hr BS= 216, A1c= 6.4 ~  labs 5/10 showed BS= 97, A1c= 6.7 ~  labs 5/11 showed BS= 107, A1c= 6.5 ~  Labs 5/12 showed BS= 98, A1c= 6.6.Marland KitchenMarland Kitchen Continue diet Rx. ~  Labs 5/13 showed BS= 110, A1c= 6.4  HYPOTHYROIDISM (ICD-244.9) - on SYNTHROID 141mcg/d... ~  TSH 6/08 was normal at 2.95 ~  TSH 3/09 was 2.18 ~  labs 9/09 at Neuro showed TSH= 0.62 ~  labs 5/10 showed TSH= 0.71 ~  labs 5/11 showed TSH= 0.31 ~  Labs 5/12 on Levothy125 showed TSH= 0.92... Continue same. ~  Labs 5/13 on Levo125 showed TSH= 1.52  Hx of COLON CANCER (ICD-153.9) - s/p right hemicolectomy in 1994... no known recurrence. ~  f/u colonoscopy 05/02/10 w/ DrPerry showed divertics, 2 sm polyps= tubular adenoma, f/u planned 68yrs.  Hx of SPLENIC INFARCTION (ICD-289.59) - eval for left side pain 6/08 w/ CTAbd showing splenic infarct (she was on a coumadin alternative study drug) & switched to coumadin at that time...  UTERINE CANCER, HX OF (ICD-V10.42) - s/p hysterectomy, subseq AP repair 5/07 DrNeal... ~  She continues w/ annual GYN f/u DrNeal & she reports doing satis...  Hx of URINARY TRACT INFECTION (ICD-599.0)  OSTEOARTHRITIS (ICD-715.90) - known hand, shoulder, knee and CSpine arthritis as noted... takes MOBIC daily & TRAMODOL Prn... she [facility] DrO'Rourke- seen 4/09 and he concurred w/ Mobic/ Tramadol for her Osteoarthritis... ~  prev eval DrDaldorf for Ortho 12/08 & 12/09 w/ left hand osteoarth (injected her middle finger PIP), right elbow DJD (improved after injection), left knee torn medial meniscus on MRI (improved after injection), and hx lumbar spinal stenosis treated by injection in the past... ~  MRI left knee 7/08  showed meniscus tear, effusion, & patellofemoral chondromalacia. ~  XRay left hand 02/16/08 showed DJD, no fractures... ~  Labs 3/09 showed  Hg=12.5, WBC=10.1, BMet=norm w/ K=4.3, sed=41, RA=neg, ANA=neg... ~ Additional labs 9/09 at [facility]- reviewed.  LOW BACK PAIN,  CHRONIC (ICD-724.2) - s/p lumbar laminectomy L3-4 for sp stenosis 2006 by DrCram...   PERIPHERAL NEUROPATHY (ICD-356.9) - eval & Rx by DrYan in 2009> NCV's showed polyneuropathy: w/ abn GTT (1hr BS= 216, HgA1c= 6.4) and B12 level= 254; also found Sed=28, CRP=13, +Lyme titer...  placed on low carb diet & started on B12 shots, along w/ 5% Lidocaine cream to apply to feet at night...  OSTEOPOROSIS (ICD-733.00) - she takes Calcium supplement w/ D & MVI daily... she has osteoporosis w/ partial compression> BMDs per GYN- DrNeal (we don't have copies & she's not on meds- "I take calcium & VitD"). ~  Labs 5/10 showed Vit D level = 50 ~  BMD done by [facility] 4/14 showed TScores +1.7 in Spine, and -1.4 in left FemNeck...  ANXIETY (ICD-300.00) - on ALPRAZOLAM 0.25mg  Prn...  VITAMIN B12 DEFICIENCY (ICD-266.2) - eval by DrYan 9/09 showed B12 level= 254 (211-911)... started on B12 shots which she took for about 41mo & switched to OTC Vit B12 1000 daily... ~  labs 5/11 showed Vit B12 level = 506 (211-911) ~  Labs 5/12 showed Vit B12 level = 728 on B12 oral supplement daily. ~  Labs 5/13 showed B12 level = 1074 & ok to decr to 1/2 tab daily...   Past Surgical History  Procedure Laterality Date  . Hysterectomy for uterine cancer    . Anterior and posterior vaginal repair  03/2006    Dr.Neal  . Basal cell skin cancer moh's surgery    . Right hemicolectomy for colon cancer  1994  . Lumbar laminectomy for spinal stenosis  2006    L3-4  . Left carpal tunnel surgery  02/2007    Dr. [name]    Outpatient Encounter Prescriptions as of 04/15/2013  Medication Sig Dispense Refill  . atorvastatin (LIPITOR) 20 MG tablet Take 1 tablet (20 mg total) by mouth daily.  90 tablet  3  . Calcium Carbonate-Vitamin D (CALTRATE 600+D) 600-400 MG-UNIT per tablet Take 1 tablet by mouth daily.        . carvedilol (COREG) 6.25 MG tablet Take 1 tablet (6.25 mg total) by mouth 2 (two) times daily.  180 tablet  3  . estradiol  (CLIMARA - DOSED IN MG/24 HR) 0.1 mg/24hr Place 1 patch onto the skin once a week.        . furosemide (LASIX) 40 MG tablet Take 1 tablet (40 mg total) by mouth daily.  90 tablet  3  . levothyroxine (SYNTHROID, LEVOTHROID) 125 MCG tablet Take 1 tablet (125 mcg total) by mouth daily.  90 tablet  4  . meloxicam (MOBIC) 7.5 MG tablet Take 1 tablet (7.5 mg total) by mouth daily.  90 tablet  3  . Multiple Vitamins-Minerals (CENTRUM SILVER PO) Take 1 tablet by mouth daily.        . potassium chloride SA (K-DUR,KLOR-CON) 20 MEQ tablet Take 20 mEq by mouth 2 (two) times daily.      . quinapril (ACCUPRIL) 20 MG tablet Take 1 tablet (20 mg total) by mouth daily.  90 tablet  3  . spironolactone (ALDACTONE) 25 MG tablet TAKE 1 TABLET EVERY DAY  90 tablet  3  . vitamin B-12 (CYANOCOBALAMIN) 1000 MCG tablet Take 1,000 mcg by  mouth daily.        Marland Kitchen warfarin (COUMADIN) 5 MG tablet Take as directed by Anticoagulation clinic  120 tablet  1  . [DISCONTINUED] potassium chloride SA (KLOR-CON M20) 20 MEQ tablet 2 TABS BID  120 tablet  6   No facility-administered encounter medications on file as of 04/15/2013.    Allergies  Allergen Reactions  . Codeine     REACTION: nausea    Current Medications, Allergies, Past Medical History, Past Surgical History, Family History, and Social History were reviewed in [employer] record.   Review of Systems        See HPI - all other systems neg except as noted... The patient complains of dyspnea on exertion and peripheral edema.  The patient denies anorexia, fever, weight loss, weight gain, vision loss, decreased hearing, hoarseness, chest pain, syncope, prolonged cough, headaches, hemoptysis, abdominal pain, melena, hematochezia, severe indigestion/heartburn, hematuria, incontinence, muscle weakness, suspicious skin lesions, transient blindness, difficulty walking, depression, unusual weight change, abnormal bleeding, enlarged lymph nodes, and  angioedema.     Objective:   Physical Exam     WD, WN, 79 y/o WF in NAD... VITAL SIGNS:  Reviewed... GENERAL:  Alert & oriented; pleasant & cooperative... HEENT:  /AT, EOM-wnl, PERRLA, EACs-clear, TMs-wnl, NOSE-clear, THROAT-clear & wnl. NECK:  Supple w/ fair ROM; no JVD; normal carotid impulses w/o bruits; no thyromegaly or nodules palpated; no lymphadenopathy. CHEST:  Clear to P & A; without wheezes/ rales/ or rhonchi. HEART:  Regular rhythm, gr 1/6 SEM; without rubs/ or gallops heard... ABDOMEN:  Soft & nontender; normal bowel sounds; no organomegaly or masses detected. EXT: without deformities, mod arthritic changes; no varicose veins/ +venous insuffic/ tr edema. NEURO:  CN's intact;  no focal neuro deficits x distal neuropathy symptoms... DERM:  No lesions noted; no rash etc...  RADIOLOGY DATA:  Reviewed in the EPIC EMR & discussed w/ the patient...  LABORATORY DATA:  Reviewed in the EPIC EMR & discussed w/ the patient...   Assessment & Plan:    HBP>  Controlled on her current med rgimen; continue same, low sodium, keep wt down...  Cardiac> AFib, Mitral Valve Dis, ?PFO, Coumadin via CC> followed by Walker Kehr & stable, continue same meds...  ASPVD> known mod right RAS, mild run-off dis in LEs, & recent ArtDopplers reviewed...  Ven Insuffic> aware- she knows to elim sodium, elev legs, wear support hose when able...  CHOL>  Stable on Lip20, but needs better low fat diet...  BorderlineDM> impaired gluc tolerance on diet alone, A1c= 6.6  HYPOTHY>  Stable on Levothy125 daily...  Hx Colon Cancer> surg 1994, no known recurrence & up to date on screening per DrPerry...   DJD>  As noted- stable on Mobic, Tramadol; followed by DrDaldorf...  Neuropathy>  eval & rx per Neuro, DrYan; she is improved...  Osteoporosis>  BMD followed by GYN, 4/14 study w/ osteopenia- continue calcium, MVI, VitD...  Other medical issues as noted...   Patient's Medications  New Prescriptions    No medications on file  Previous Medications   ALPRAZOLAM (XANAX) 0.25 MG TABLET    Take 0.25 mg by mouth at bedtime as needed for sleep.   CALCIUM CARBONATE-VITAMIN D (CALTRATE 600+D) 600-400 MG-UNIT PER TABLET    Take 1 tablet by mouth daily.     CARVEDILOL (COREG) 6.25 MG TABLET    Take 1 tablet (6.25 mg total) by mouth 2 (two) times daily.   ESTRADIOL (CLIMARA - DOSED IN MG/24 HR) 0.1 MG/24HR  Place 1 patch onto the skin once a week.     FUROSEMIDE (LASIX) 40 MG TABLET    Take 1 tablet (40 mg total) by mouth daily.   LEVOTHYROXINE (SYNTHROID, LEVOTHROID) 125 MCG TABLET    Take 1 tablet (125 mcg total) by mouth daily.   MELOXICAM (MOBIC) 7.5 MG TABLET    Take 1 tablet (7.5 mg total) by mouth daily.   MULTIPLE VITAMINS-MINERALS (CENTRUM SILVER PO)    Take 1 tablet by mouth daily.     QUINAPRIL (ACCUPRIL) 20 MG TABLET    Take 1 tablet (20 mg total) by mouth daily.   SPIRONOLACTONE (ALDACTONE) 25 MG TABLET    TAKE 1 TABLET EVERY DAY   VITAMIN B-12 (CYANOCOBALAMIN) 1000 MCG TABLET    Take 1,000 mcg by mouth daily.     WARFARIN (COUMADIN) 5 MG TABLET    Take as directed by Anticoagulation clinic  Modified Medications   Modified Medication Previous Medication   ATORVASTATIN (LIPITOR) 20 MG TABLET atorvastatin (LIPITOR) 20 MG tablet      Take 1 tablet (20 mg total) by mouth daily.    Take 1 tablet (20 mg total) by mouth daily.   POTASSIUM CHLORIDE SA (K-DUR,KLOR-CON) 20 MEQ TABLET potassium chloride SA (KLOR-CON M20) 20 MEQ tablet      Take 20 mEq by mouth 2 (two) times daily.    2 TABS BID  Discontinued Medications   No medications on file

## 2013-04-16 LAB — VITAMIN D 25 HYDROXY (VIT D DEFICIENCY, FRACTURES): Vit D, 25-Hydroxy: 51 ng/mL (ref 30–89)

## 2013-05-01 ENCOUNTER — Encounter: Payer: Self-pay | Admitting: Pulmonary Disease

## 2013-05-09 ENCOUNTER — Other Ambulatory Visit: Payer: Self-pay | Admitting: Pulmonary Disease

## 2013-05-26 ENCOUNTER — Ambulatory Visit (INDEPENDENT_AMBULATORY_CARE_PROVIDER_SITE_OTHER): Payer: Medicare Other | Admitting: *Deleted

## 2013-05-26 DIAGNOSIS — Z7901 Long term (current) use of anticoagulants: Secondary | ICD-10-CM | POA: Diagnosis not present

## 2013-05-26 DIAGNOSIS — I4891 Unspecified atrial fibrillation: Secondary | ICD-10-CM | POA: Diagnosis not present

## 2013-05-26 LAB — POCT INR: INR: 2.9

## 2013-07-07 ENCOUNTER — Ambulatory Visit (INDEPENDENT_AMBULATORY_CARE_PROVIDER_SITE_OTHER): Payer: Medicare Other | Admitting: *Deleted

## 2013-07-07 DIAGNOSIS — Z7901 Long term (current) use of anticoagulants: Secondary | ICD-10-CM

## 2013-07-07 DIAGNOSIS — I4891 Unspecified atrial fibrillation: Secondary | ICD-10-CM | POA: Diagnosis not present

## 2013-07-07 LAB — POCT INR: INR: 2

## 2013-07-26 ENCOUNTER — Other Ambulatory Visit: Payer: Self-pay | Admitting: Cardiovascular Disease

## 2013-08-17 DIAGNOSIS — Z23 Encounter for immunization: Secondary | ICD-10-CM | POA: Diagnosis not present

## 2013-08-18 ENCOUNTER — Ambulatory Visit (INDEPENDENT_AMBULATORY_CARE_PROVIDER_SITE_OTHER): Payer: Medicare Other | Admitting: Pharmacist

## 2013-08-18 ENCOUNTER — Other Ambulatory Visit: Payer: Self-pay

## 2013-08-18 DIAGNOSIS — Z7901 Long term (current) use of anticoagulants: Secondary | ICD-10-CM | POA: Diagnosis not present

## 2013-08-18 DIAGNOSIS — I4891 Unspecified atrial fibrillation: Secondary | ICD-10-CM

## 2013-08-18 MED ORDER — POTASSIUM CHLORIDE CRYS ER 20 MEQ PO TBCR: 20.0000 meq | EXTENDED_RELEASE_TABLET | Freq: Two times a day (BID) | ORAL | Status: DC

## 2013-09-09 ENCOUNTER — Other Ambulatory Visit: Payer: Self-pay | Admitting: Cardiovascular Disease

## 2013-09-09 NOTE — Telephone Encounter (Signed)
Do I fill this or send to pcp?

## 2013-09-10 ENCOUNTER — Other Ambulatory Visit: Payer: Self-pay

## 2013-09-10 MED ORDER — LEVOTHYROXINE SODIUM 125 MCG PO TABS: 125.0000 ug | ORAL_TABLET | Freq: Every day | ORAL | Status: DC

## 2013-09-14 ENCOUNTER — Ambulatory Visit (INDEPENDENT_AMBULATORY_CARE_PROVIDER_SITE_OTHER): Payer: Medicare Other | Admitting: Cardiovascular Disease

## 2013-09-14 ENCOUNTER — Encounter: Payer: Self-pay | Admitting: Cardiovascular Disease

## 2013-09-14 VITALS — BP 118/68 | HR 72 | Ht 71.0 in | Wt 193.0 lb

## 2013-09-14 DIAGNOSIS — I350 Nonrheumatic aortic (valve) stenosis: Secondary | ICD-10-CM

## 2013-09-14 DIAGNOSIS — E039 Hypothyroidism, unspecified: Secondary | ICD-10-CM

## 2013-09-14 DIAGNOSIS — I10 Essential (primary) hypertension: Secondary | ICD-10-CM

## 2013-09-14 DIAGNOSIS — I4891 Unspecified atrial fibrillation: Secondary | ICD-10-CM | POA: Diagnosis not present

## 2013-09-14 DIAGNOSIS — I872 Venous insufficiency (chronic) (peripheral): Secondary | ICD-10-CM

## 2013-09-14 DIAGNOSIS — E785 Hyperlipidemia, unspecified: Secondary | ICD-10-CM

## 2013-09-14 DIAGNOSIS — I359 Nonrheumatic aortic valve disorder, unspecified: Secondary | ICD-10-CM

## 2013-09-14 NOTE — Progress Notes (Signed)
Patient ID: [name], female   DOB: February 02, 1929, 79 y.o.   MRN: [id number] Clorine is seen today for f/U of afib, HTN, and edema. Her coumadin has been Rx. No palpitations, SOB or SSCP. She is walking on a regular basis without symptoms. She retired from [employer] a year ago. Her husband [name] died and she is now living in apartment near [employer] Aldactone has worked well as a diuretic for edema as she requires much less K. She had a splenic infarct when enrolled in the engage trial and we have not thought about changing her to Pradaxa  Recent right foot surgery by Petrinitz.  BP and pulse seem to be better today  Last visit beta blocker increased BP better but with less dyspnea and palpitations. Started back doing water aerobics at the [facility]  Working at [employer] part time again  I care for her sister Mora Bellman who has had issues with edema and memory      ROS: Denies fever, malais, weight loss, blurry vision, decreased visual acuity, cough, sputum, SOB, hemoptysis, pleuritic pain, palpitaitons, heartburn, abdominal pain, melena, lower extremity edema, claudication, or rash.  All other systems reviewed and negative  General: Affect appropriate Healthy:  appears stated age HEENT: normal Neck supple with no adenopathy JVP normal no bruits no thyromegaly Lungs clear with no wheezing and good diaphragmatic motion Heart:  S1/S2 AS  murmur, no rub, gallop or click PMI normal Abdomen: benighn, BS positve, no tenderness, no AAA no bruit.  No HSM or HJR Distal pulses intact with no bruits No edema Neuro non-focal Skin warm and dry No muscular weakness   Current Outpatient Prescriptions  Medication Sig Dispense Refill  . ALPRAZolam (XANAX) 0.25 MG tablet Take 0.25 mg by mouth at bedtime as needed for sleep.      Marland Kitchen atorvastatin (LIPITOR) 20 MG tablet Take 1 tablet (20 mg total) by mouth daily.  90 tablet  3  . Calcium Carbonate-Vitamin D (CALTRATE 600+D) 600-400 MG-UNIT per tablet Take 1 tablet  by mouth daily.        . carvedilol (COREG) 6.25 MG tablet Take 1 tablet (6.25 mg total) by mouth 2 (two) times daily.  180 tablet  3  . estradiol (CLIMARA - DOSED IN MG/24 HR) 0.1 mg/24hr Place 1 patch onto the skin once a week.        . furosemide (LASIX) 40 MG tablet Take 1 tablet (40 mg total) by mouth daily.  90 tablet  3  . levothyroxine (SYNTHROID, LEVOTHROID) 125 MCG tablet Take 1 tablet (125 mcg total) by mouth daily.  90 tablet  4  . meclizine (ANTIVERT) 25 MG tablet TAKE 1/2 OR 1 TABLET BY MOUTH EVERY 6 HOURS AS NEEDED FOR DIZZINESS  30 tablet  1  . meloxicam (MOBIC) 7.5 MG tablet Take 1 tablet (7.5 mg total) by mouth daily.  90 tablet  3  . Multiple Vitamins-Minerals (CENTRUM SILVER PO) Take 1 tablet by mouth daily.        . potassium chloride SA (K-DUR,KLOR-CON) 20 MEQ tablet Take 1 tablet (20 mEq total) by mouth 2 (two) times daily.  30 tablet  6  . quinapril (ACCUPRIL) 20 MG tablet Take 1 tablet (20 mg total) by mouth daily.  90 tablet  3  . spironolactone (ALDACTONE) 25 MG tablet TAKE 1 TABLET EVERY DAY  90 tablet  3  . vitamin B-12 (CYANOCOBALAMIN) 1000 MCG tablet Take 1,000 mcg by mouth daily.        Marland Kitchen  warfarin (COUMADIN) 5 MG tablet TAKE AS DIRECTED BY ANTICOAGULATION CLINIC  120 tablet  1   No current facility-administered medications for this visit.    Allergies  Codeine  Electrocardiogram:  03/06/63  afib rate 64 nonspecific ST/T wave changes  Assessment and Plan

## 2013-09-14 NOTE — Patient Instructions (Signed)

## 2013-09-14 NOTE — Assessment & Plan Note (Signed)
TSH normal in May continue replacement Rx

## 2013-09-14 NOTE — Assessment & Plan Note (Signed)
Well controlled.  Continue current medications and low sodium Dash type diet.    

## 2013-09-14 NOTE — Assessment & Plan Note (Signed)
Cholesterol is at goal.  Continue current dose of statin and diet Rx.  No myalgias or side effects.  F/U  LFT's in 6 months. Lab Results  Component Value Date   LDLCALC 84 04/15/2013             

## 2013-09-14 NOTE — Assessment & Plan Note (Signed)
Murmur more apparent on exam  5/13 mild AS mild AR  F/U echo

## 2013-09-14 NOTE — Assessment & Plan Note (Signed)
Good rate control and anticoagulation f/u coumadin clinic 

## 2013-09-14 NOTE — Assessment & Plan Note (Signed)
Minidmal swelling Dependant Continue low dose diuretic Low sodium diet

## 2013-09-29 ENCOUNTER — Ambulatory Visit (HOSPITAL_COMMUNITY): Payer: Medicare Other | Attending: Cardiology | Admitting: Radiology

## 2013-09-29 ENCOUNTER — Ambulatory Visit (INDEPENDENT_AMBULATORY_CARE_PROVIDER_SITE_OTHER): Payer: Medicare Other | Admitting: *Deleted

## 2013-09-29 DIAGNOSIS — I059 Rheumatic mitral valve disease, unspecified: Secondary | ICD-10-CM | POA: Diagnosis not present

## 2013-09-29 DIAGNOSIS — Z7901 Long term (current) use of anticoagulants: Secondary | ICD-10-CM

## 2013-09-29 DIAGNOSIS — R079 Chest pain, unspecified: Secondary | ICD-10-CM | POA: Insufficient documentation

## 2013-09-29 DIAGNOSIS — I10 Essential (primary) hypertension: Secondary | ICD-10-CM | POA: Insufficient documentation

## 2013-09-29 DIAGNOSIS — I359 Nonrheumatic aortic valve disorder, unspecified: Secondary | ICD-10-CM | POA: Insufficient documentation

## 2013-09-29 DIAGNOSIS — G609 Hereditary and idiopathic neuropathy, unspecified: Secondary | ICD-10-CM | POA: Insufficient documentation

## 2013-09-29 DIAGNOSIS — E039 Hypothyroidism, unspecified: Secondary | ICD-10-CM | POA: Diagnosis not present

## 2013-09-29 DIAGNOSIS — I4891 Unspecified atrial fibrillation: Secondary | ICD-10-CM | POA: Diagnosis not present

## 2013-09-29 DIAGNOSIS — I379 Nonrheumatic pulmonary valve disorder, unspecified: Secondary | ICD-10-CM | POA: Diagnosis not present

## 2013-09-29 DIAGNOSIS — I350 Nonrheumatic aortic (valve) stenosis: Secondary | ICD-10-CM

## 2013-09-29 DIAGNOSIS — I079 Rheumatic tricuspid valve disease, unspecified: Secondary | ICD-10-CM | POA: Diagnosis not present

## 2013-09-29 DIAGNOSIS — R011 Cardiac murmur, unspecified: Secondary | ICD-10-CM | POA: Insufficient documentation

## 2013-09-29 NOTE — Progress Notes (Signed)
Echocardiogram performed.  

## 2013-10-19 ENCOUNTER — Other Ambulatory Visit: Payer: Self-pay | Admitting: *Deleted

## 2013-10-19 MED ORDER — QUINAPRIL HCL 20 MG PO TABS: 20.0000 mg | ORAL_TABLET | Freq: Every day | ORAL | Status: DC

## 2013-10-20 ENCOUNTER — Ambulatory Visit (INDEPENDENT_AMBULATORY_CARE_PROVIDER_SITE_OTHER): Payer: Medicare Other | Admitting: Pulmonary Disease

## 2013-10-20 ENCOUNTER — Encounter: Payer: Self-pay | Admitting: Pulmonary Disease

## 2013-10-20 ENCOUNTER — Other Ambulatory Visit (INDEPENDENT_AMBULATORY_CARE_PROVIDER_SITE_OTHER): Payer: Medicare Other

## 2013-10-20 VITALS — BP 142/64 | HR 69 | Temp 97.8°F | Ht 70.5 in | Wt 193.2 lb

## 2013-10-20 DIAGNOSIS — I059 Rheumatic mitral valve disease, unspecified: Secondary | ICD-10-CM | POA: Diagnosis not present

## 2013-10-20 DIAGNOSIS — D126 Benign neoplasm of colon, unspecified: Secondary | ICD-10-CM

## 2013-10-20 DIAGNOSIS — M1990 Unspecified osteoarthritis, unspecified site: Secondary | ICD-10-CM

## 2013-10-20 DIAGNOSIS — I10 Essential (primary) hypertension: Secondary | ICD-10-CM

## 2013-10-20 DIAGNOSIS — K5730 Diverticulosis of large intestine without perforation or abscess without bleeding: Secondary | ICD-10-CM

## 2013-10-20 DIAGNOSIS — G609 Hereditary and idiopathic neuropathy, unspecified: Secondary | ICD-10-CM

## 2013-10-20 DIAGNOSIS — I739 Peripheral vascular disease, unspecified: Secondary | ICD-10-CM

## 2013-10-20 DIAGNOSIS — M545 Low back pain, unspecified: Secondary | ICD-10-CM

## 2013-10-20 DIAGNOSIS — I350 Nonrheumatic aortic (valve) stenosis: Secondary | ICD-10-CM

## 2013-10-20 DIAGNOSIS — E039 Hypothyroidism, unspecified: Secondary | ICD-10-CM

## 2013-10-20 DIAGNOSIS — E785 Hyperlipidemia, unspecified: Secondary | ICD-10-CM

## 2013-10-20 DIAGNOSIS — I4891 Unspecified atrial fibrillation: Secondary | ICD-10-CM | POA: Diagnosis not present

## 2013-10-20 DIAGNOSIS — C189 Malignant neoplasm of colon, unspecified: Secondary | ICD-10-CM

## 2013-10-20 DIAGNOSIS — F411 Generalized anxiety disorder: Secondary | ICD-10-CM

## 2013-10-20 DIAGNOSIS — I359 Nonrheumatic aortic valve disorder, unspecified: Secondary | ICD-10-CM | POA: Diagnosis not present

## 2013-10-20 DIAGNOSIS — I872 Venous insufficiency (chronic) (peripheral): Secondary | ICD-10-CM

## 2013-10-20 DIAGNOSIS — M810 Age-related osteoporosis without current pathological fracture: Secondary | ICD-10-CM

## 2013-10-20 LAB — BASIC METABOLIC PANEL
Chloride: 105 mEq/L (ref 96–112)
Creatinine, Ser: 0.8 mg/dL (ref 0.4–1.2)
Glucose, Bld: 102 mg/dL — ABNORMAL HIGH (ref 70–99)
Potassium: 4.2 mEq/L (ref 3.5–5.1)

## 2013-10-20 MED ORDER — TRAMADOL HCL 50 MG PO TABS: 50.0000 mg | ORAL_TABLET | Freq: Three times a day (TID) | ORAL | Status: DC | PRN

## 2013-10-20 NOTE — Progress Notes (Signed)
Subjective:    Patient ID: [name], female    DOB: Mar 30, 1929, 79 y.o.   MRN: [id number]  HPI 79 y/o WF here for a follow up visit... she has multiple medical problems as listed below...   ~  Apr 14, 2012:  38mo ROV & [place]i has had several visits w/ Walker Kehr this spring for elev BP & palpit> he follows her for HBP, AFib, Valv heart dis, Edema;  2DEcho 5/13 showed norm LV size & function w/ EF=60-65%, calcif Aortic valve leaflets w/ mild AS& AI, severe LAdil, mild to mod MS & mild MR;  Holter Monitor showed chr AFib w/ rates 46-166; he increased Coreg to 6.25Bid & added Accupril20mg /d...    She had right foot hammertoe surg by DrPetrinitz 4/13     We reviewed prob list, meds, xrays & labs>  See below>>  CXR 5/13 showede borderline heart size, sl hyperinflation w/ lingular scarring, NAD... LABS 5/13:  FLP- at goals on Lip20;  Chems- ok w/ BS=110 A1c=6.4;  CBC- wnl;  TSH=1.52 on [id number];  B12=1074   ~  October 15, 2012:  38mo ROV & [name] reports that she is doing well, no new complaints or concerns; she is however concerned about her sis Ruby & we discussed this...     HBP> on Coreg6.25Bid, Accupril20, Lasix40, Aldactone25, K20Bid; BP=128/78 & she denies CP, palpit, SOB, edema; we rec incr exercise program...    AFib, Mitral valve dis> on Coumadin via CC; followed by Walker Kehr for Cards- seen 5/13 for her HBP, AFib, MS- stable, no changes made...    ASPVD> she had mild to mod right RAS on arteriogram in 2008, normal ABIs but mild run-off dis in legs...    VI> known VI but doing well on Lasix, Aldactone, KCl; she knows to elim sodium, elev legs, etc...    Chol> on Lip20; FLP has been well controlled & 5/13= TChol 140, TG 88, HDL 48, LDL 74    Impaired GlucTol> on diet alone; BS has been ~110, and A1c~6.5; we reviewed low carb diet & wt reduction...    Hypothyroid> on Synthroid125; TSH has been well maintained on this dose...    Hx colon cancer & uterine ca> see below- she is up to date on f/u  screening by DrPerry & DrNeal...    DJD, LBP> on Mobic7.5, Calcium, MVI, VitD; she had recent foot surg from DrPetrinitz...    Neuropathy> on B12 supplement; prev eval by Sula Soda, she is minimally symptomatic...    Anxiety> on Xanax0.25 prn... We reviewed prob list, meds, xrays and labs> see below for updates >> she had the 2013 Flu vaccine in September... LABS 11/13:  Chems- ok x BS= 114...    ~  Apr 15, 2013:  38mo ROV & [name] has had a good interval w/o new complaints or concerns;  We reviewed the following medical problems during today's office visit >>     HBP> on Coreg6.25Bid, Accupril20, Lasix40, Aldactone25, K20Bid; BP=128/78 & she denies CP, palpit, SOB, edema; we rec incr exercise program...    AFib, Mitral valve dis> on Coumadin via CC; followed by Walker Kehr for Cards- seen 4/14 for her HBP, AFib, MS- stable, no changes made...    ASPVD> she had mild to mod right RAS on arteriogram in 2008, normal ABIs but mild run-off dis in legs...    VI> known VI but doing well on Lasix, Aldactone, KCl; she knows to elim sodium, elev legs, etc...    Chol> on Lip20; FLP has  been well controlled & 5/14= TChol 160, TG 160, HDL 45, LDL 84 and we stressed low fat diet...    Impaired GlucTol> on diet alone; BS has been ~110, and A1c~6.5; we reviewed low carb diet & wt reduction...    Hypothyroid> on Synthroid125; TSH has been well maintained on this dose...    Hx colon cancer & uterine ca> see below- she is up to date on f/u screening by DrPerry & DrNeal...    DJD, LBP> on Mobic7.5, Calcium, MVI, VitD; she had foot surg from DrPetrinitz...    Neuropathy> on B12 supplement; prev eval by Sula Soda, she is minimally symptomatic...    Anxiety> on Xanax0.25 prn... We reviewed prob list, meds, xrays and labs> see below for updates >>  LABS 5/14:  FLP- at goals on Lip20 x TG=160;  Chems- ok x BS=105 A1c=6.5;  CBC- wnl;  TSH=1.25 on [id number];  VitD=51  ~  October 20, 2013:  18mo ROV & [name] reports doing well- no new  complaints or concerns; she is working part time at [employer], exercises w/ water aerobics; she has some arthritic discomfort in her hips and legs but says Tramadol & Tylenol works well for her...     BP= 142/64 & remains controlled on Coreg6.25Bid, Accupril20, Lasix40, Aldactone25, K20Bid...    She remains on Coumadin via the CC for her AFib & mitral valve dis; followed by Walker Kehr w/ Recheck 10/14 & no changes made; 2DEcho done- see below...     Lipids regulated w/ Lip20 & labs 5/14 at goals x TG=160, discussed low fat diet...     Thyroid is regulated w/ Synthroid125 & labs followed regularly...    She has Alprazolam 0.25mg  for prn use... We reviewed prob list, meds, xrays and labs> see below for updates >> she had the 2014 Flu vaccine 9/14...  LABS 11/14:  Chems- wnl & electrolytes are ok on her diuretic regimen...           Problem List:  HYPERTENSION (ICD-401.9) - controlled on COREG 6.25mg Bid, ACCUPRIL 20mg /d, LASIX 40mg /d, ALDACTONE 25mg /d & K20Bid... ~  labs 9/09 at Neuro showed K= 4.3, BUN= 15, Creat= 0.6 on Coreg3.125Bid, Accupril20, Lasix40, K20-2Bid. ~  labs 1/10 showed K= 4.8, BUN= 21, Creat= 0.6 ~  labs 5/10 showed K= 4.5, BUN= 17, Creat= 0.6..Marland Kitchen Continue same. ~  labs 5/11 showed K= 4.8, BUN= 19, Creat= 0.7; CXR- cardiomeg, mild hyperinflation, clear, NAD. ~  5/12:  BP= 124/72 today and similar at home unless stressed... denies HA, fatigue, visual changes, CP, palipit, dizziness, syncope, dyspnea, edema, etc... ~  Labs 5/12 showed K= 5.1, BUN= 21, Creat= 0.8; CXR- mild cardiomeg, clear, NAD; rec to decr K20 from 4/d to 2/d... ~  11/12:  BP= 112/74 & she remains asymptomatic; BMet remaiins wnl w/ K=4.3 ~  5/13:  BP= 118/64 & she she denies symptoms of CP, palpit, SOB, etc;  CXR 5/13 showed borderline heart size, COPD w/ linear scarring in Lingula, NAD... ~  11/13:  BP= 128/78 & she remains largely asymptomatic... ~  5/14: on Coreg6.25Bid, Accupril20, Lasix40, Aldactone25, K20Bid;  BP=128/78 & she denies CP, palpit, SOB, edema; we rec incr exercise program... ~  11/14: on same meds and BP= 142/64 & she remains asymptomatic....  ATRIAL FIBRILLATION (ICD-427.31) - on Coumadin per cardiology & followed in the [facility]... MITRAL VALVE DISORDER (ICD-424.0) - followed by Walker Kehr & seen Q40mo... ? of PATENT FORAMEN OVALE (ICD-745.5) ~  NuclearStressTest 11/05 showed norm perfusion, mild apic thinning, no ischemia, EF=79%... ~  2DEcho 9/08 showed norm LVF, EF=55-60%, no regional wall motion abn, mod thickening of MV, mod MR, mod dil LA, ?PFO vs small ASD.Marland Kitchen. (prev echo's w/ mild MS)... ~  2DEcho 2/10 showed norm LVF w/o regional wall motion abn & EF= 55%, mild MS & mod MR (Rheumatic deformity), mod dil LA & RA, etc... ~  Myoview 5/11 showed  +ischemia & ST depression, EF= 77% ~  Cath 6/11 showed essent norm coronaries (min LAD plaque), dcalcif AoV, norm LVF, MR... ~  2DEcho 6/11 showed norm LVF, mild LVH, dil RV w/ mild decr RVF, dil LA, mild MV thickening w/ mild MS & mod MR, AoV sclerosis & no stenosis. ~  LifeWatch Monitor 4/13 showed chronic AFib, ave HR=67 ~  2DEcho 5/13 showed norm LV size & function w/ EF=60-65%, calcif Ao leaflets w/ mild AS& AI, severe LAdil, mild to mod MS & mild MR, Aorta is ok... ~  4/14: She had f/u DrNishan> AFib, HBP, ValvDis, edema- on Coumadin, asymptomatic, doing satis & no changes made; EKG 4/14 showed AFib, rate64, NSSTTWA... ~  10/14: She had f/u DrNishan> AFib, HBP, Valvular heart dis, edema; no CP or palpit, no change in meds... ~  2DEcho 11/14 showed mild LVH, norm LVF w/ EF=55-60%, mild AS, rheumatic MV w/ mild MS, mod MR, dil LA appendage, mod TR...  PERIPHERAL VASCULAR DISEASE (ICD-443.9) - tortuous Ao on CXR...  ~  CTAngio Abd&Legs 7/08 showed mild to mod right RAS, no signif PVD, mild run-off disease in legs... ~  LE Art Dopplers 2/12 showed normal ABIs, normal toe indicies as well, biphasic & broadened PTA waveforms  noted...  VENOUS INSUFFICIENCY (ICD-459.81) - on LASIX 40mg /d, ALDACTONE 25mg /d, + KCl - 2Bid... she follows a low sodium diet, elevates, support hose...  HYPERLIPIDEMIA (ICD-272.4) - on LIPITOR 20mg /d + diet Rx...  ~  FLP 5/08 showed TChol 100, TG 65, HDL 40, LDL 47 ~  FLP 3/09 showed TChol 114, TG 85, HDL 35, LDL 62 ~  FLP 9/09 at Neuro showed TChol 125, TG 118, HDL 34, LDL 67 ~  FLP 5/10 showed TChol 136, TG 127, HDL 36, LDL 74 ~  FLP 5/11 showed TChol 177, TG 167, HDL 51, LDL 93 ~  FLP 5/12 on Lip20 showed TChol 149, TG 168, HDL 47, LDL 69... rec same med, better low fat diet. ~  FLP 5/13 on Lip20 showed TChol 140, TG 88, HDL 48, LDL 74 ~  FLP 5/14 on Lip20 showed TChol 160, TG 160, HDL 45, LDL 84  IMPAIRED GLUCOSE TOLERANCE (ICD-271.3) - on diet  alone> see eval by Sula Soda, Neurology... ~  GTT 9/09 showed FBS= 101, 1hr BS= 216, A1c= 6.4 ~  labs 5/10 showed BS= 97, A1c= 6.7 ~  labs 5/11 showed BS= 107, A1c= 6.5 ~  Labs 5/12 showed BS= 98, A1c= 6.6.Marland KitchenMarland Kitchen Continue diet Rx. ~  Labs 5/13 showed BS= 110, A1c= 6.4 ~  Labs 5/14 on diet alone showed BS= 105, A1c= 6.5  HYPOTHYROIDISM (ICD-244.9) - on SYNTHROID 166mcg/d... ~  TSH 6/08 was normal at 2.95 ~  TSH 3/09 was 2.18 ~  labs 9/09 at Neuro showed TSH= 0.62 ~  labs 5/10 showed TSH= 0.71 ~  labs 5/11 showed TSH= 0.31 ~  Labs 5/12 on Levothy125 showed TSH= 0.92... Continue same. ~  Labs 5/13 on Levo125 showed TSH= 1.52 ~  Labs 5/14 on Levo125 showed TSH= 1.25  Hx of COLON CANCER (ICD-153.9) - s/p right hemicolectomy in 1994... no known recurrence. ~  f/u colonoscopy 05/02/10 w/ DrPerry showed divertics, 2 sm polyps= tubular adenoma, f/u planned 61yrs.  Hx of SPLENIC INFARCTION (ICD-289.59) - eval for left side pain 6/08 w/ CTAbd showing splenic infarct (she was on a coumadin alternative study drug) & switched to coumadin at that time...  UTERINE CANCER, HX OF (ICD-V10.42) - s/p hysterectomy, subseq AP repair 5/07 DrNeal... ~  She  continues w/ annual GYN f/u DrNeal & she reports doing satis...  Hx of URINARY TRACT INFECTION (ICD-599.0)  OSTEOARTHRITIS (ICD-715.90) - known hand, shoulder, knee and CSpine arthritis as noted... takes MOBIC daily & TRAMODOL Prn... she [facility] appt [facility] DrO'Rourke- seen 4/09 and he concurred w/ Mobic/ Tramadol for her Osteoarthritis... ~  prev eval DrDaldorf for Ortho 12/08 & 12/09 w/ left hand osteoarth (injected her middle finger PIP), right elbow DJD (improved after injection), left knee torn medial meniscus on MRI (improved after injection), and hx lumbar spinal stenosis treated by injection in the past... ~  MRI left knee 7/08 showed meniscus tear, effusion, & patellofemoral chondromalacia. ~  XRay left hand 02/16/08 showed DJD, no fractures... ~  Labs 3/09 showed  Hg=12.5, WBC=10.1, BMet=norm w/ K=4.3, sed=41, RA=neg, ANA=neg... ~ Additional labs 9/09 at [facility]- reviewed.  LOW BACK PAIN, CHRONIC (ICD-724.2) - s/p lumbar laminectomy L3-4 for sp stenosis 2006 by DrCram...   PERIPHERAL NEUROPATHY (ICD-356.9) - eval & Rx by DrYan in 2009> NCV's showed polyneuropathy: w/ abn GTT (1hr BS= 216, HgA1c= 6.4) and B12 level= 254; also found Sed=28, CRP=13, +Lyme titer...  placed on low carb diet & started on B12 shots, along w/ 5% Lidocaine cream to apply to feet at night...  OSTEOPOROSIS (ICD-733.00) - she takes Calcium supplement w/ D & MVI daily... she has osteoporosis w/ partial compression> BMDs per GYN- DrNeal (we don't have copies & she's not on meds- "I take calcium & VitD"). ~  Labs 5/10 showed Vit D level = 50 ~  BMD done by [facility] 4/14 showed TScores +1.7 in Spine, and -1.4 in left FemNeck...  ANXIETY (ICD-300.00) - on ALPRAZOLAM 0.25mg  Prn...  VITAMIN B12 DEFICIENCY (ICD-266.2) - eval by DrYan 9/09 showed B12 level= 254 (211-911)... started on B12 shots which she took for about 89mo & switched to OTC Vit B12 1000 daily... ~  labs 5/11 showed Vit B12 level = 506 (211-911) ~  Labs 5/12  showed Vit B12 level = 728 on B12 oral supplement daily. ~  Labs 5/13 showed B12 level = 1074 & ok to decr to 1/2 tab daily... ~  Labs 5/14 showed Hg= 14.2, MCV= 95  Past Surgical History  Procedure Laterality Date  . Hysterectomy for uterine cancer    . Anterior and posterior vaginal repair  03/2006    Dr.Neal  . Basal cell skin cancer moh's surgery    . Right hemicolectomy for colon cancer  1994  . Lumbar laminectomy for spinal stenosis  2006    L3-4  . Left carpal tunnel surgery  02/2007    Dr. [name]    Outpatient Encounter Prescriptions as of 10/20/2013  Medication Sig  . ALPRAZolam (XANAX) 0.25 MG tablet Take 0.25 mg by mouth at bedtime as needed for sleep.  Marland Kitchen atorvastatin (LIPITOR) 20 MG tablet Take 1 tablet (20 mg total) by mouth daily.  . Calcium Carbonate-Vitamin D (CALTRATE 600+D) 600-400 MG-UNIT per tablet Take 1 tablet by mouth daily.    . carvedilol (COREG) 6.25 MG tablet Take 1 tablet (6.25 mg total) by mouth 2 (two) times daily.  Marland Kitchen  estradiol (CLIMARA - DOSED IN MG/24 HR) 0.1 mg/24hr Place 1 patch onto the skin once a week.    . furosemide (LASIX) 40 MG tablet Take 1 tablet (40 mg total) by mouth daily.  Marland Kitchen levothyroxine (SYNTHROID, LEVOTHROID) 125 MCG tablet Take 1 tablet (125 mcg total) by mouth daily.  . meclizine (ANTIVERT) 25 MG tablet TAKE 1/2 OR 1 TABLET BY MOUTH EVERY 6 HOURS AS NEEDED FOR DIZZINESS  . meloxicam (MOBIC) 7.5 MG tablet Take 1 tablet (7.5 mg total) by mouth daily.  . Multiple Vitamins-Minerals (CENTRUM SILVER PO) Take 1 tablet by mouth daily.    . potassium chloride SA (K-DUR,KLOR-CON) 20 MEQ tablet Take 1 tablet (20 mEq total) by mouth 2 (two) times daily.  . quinapril (ACCUPRIL) 20 MG tablet Take 1 tablet (20 mg total) by mouth daily.  Marland Kitchen spironolactone (ALDACTONE) 25 MG tablet TAKE 1 TABLET EVERY DAY  . vitamin B-12 (CYANOCOBALAMIN) 1000 MCG tablet Take 1,000 mcg by mouth daily.    Marland Kitchen warfarin (COUMADIN) 5 MG tablet TAKE AS DIRECTED BY  ANTICOAGULATION CLINIC    Allergies  Allergen Reactions  . Codeine     REACTION: nausea    Current Medications, Allergies, Past Medical History, Past Surgical History, Family History, and Social History were reviewed in [employer] record.   Review of Systems        See HPI - all other systems neg except as noted... The patient complains of dyspnea on exertion and peripheral edema.  The patient denies anorexia, fever, weight loss, weight gain, vision loss, decreased hearing, hoarseness, chest pain, syncope, prolonged cough, headaches, hemoptysis, abdominal pain, melena, hematochezia, severe indigestion/heartburn, hematuria, incontinence, muscle weakness, suspicious skin lesions, transient blindness, difficulty walking, depression, unusual weight change, abnormal bleeding, enlarged lymph nodes, and angioedema.     Objective:   Physical Exam     WD, WN, 79 y/o WF in NAD... VITAL SIGNS:  Reviewed... GENERAL:  Alert & oriented; pleasant & cooperative... HEENT:  [name]/AT, EOM-wnl, PERRLA, EACs-clear, TMs-wnl, NOSE-clear, THROAT-clear & wnl. NECK:  Supple w/ fair ROM; no JVD; normal carotid impulses w/o bruits; no thyromegaly or nodules palpated; no lymphadenopathy. CHEST:  Clear to P & A; without wheezes/ rales/ or rhonchi. HEART:  Regular rhythm, gr 1/6 SEM; without rubs/ or gallops heard... ABDOMEN:  Soft & nontender; normal bowel sounds; no organomegaly or masses detected. EXT: without deformities, mod arthritic changes; no varicose veins/ +venous insuffic/ tr edema. NEURO:  CN's intact;  no focal neuro deficits x distal neuropathy symptoms... DERM:  No lesions noted; no rash etc...  RADIOLOGY DATA:  Reviewed in the EPIC EMR & discussed w/ the patient...  LABORATORY DATA:  Reviewed in the EPIC EMR & discussed w/ the patient...   Assessment & Plan:    HBP>  Controlled on her current med rgimen; continue same, low sodium, keep wt down...  Cardiac> AFib,  Mitral Valve Dis, ?PFO, Coumadin via CC> followed by Walker Kehr & stable, continue same meds...  ASPVD> known mod right RAS, mild run-off dis in LEs, & recent ArtDopplers reviewed...  Ven Insuffic> aware- she knows to elim sodium, elev legs, wear support hose when able...  CHOL>  Stable on Lip20, but needs better low fat diet...  BorderlineDM> impaired gluc tolerance on diet alone, A1c= 6.5  HYPOTHY>  Stable on Levothy125 daily...  Hx Colon Cancer> surg 1994, no known recurrence & up to date on screening per DrPerry...   DJD>  As noted- stable on Mobic, Tramadol; followed by  DrDaldorf...  Neuropathy>  eval & rx per Neuro, DrYan; she is improved...  Osteoporosis>  BMD followed by GYN, 4/14 study w/ osteopenia- continue calcium, MVI, VitD...  Other medical issues as noted...   Patient's Medications  New Prescriptions   TRAMADOL (ULTRAM) 50 MG TABLET    Take 1 tablet (50 mg total) by mouth 3 (three) times daily as needed.  Previous Medications   ALPRAZOLAM (XANAX) 0.25 MG TABLET    Take 0.25 mg by mouth at bedtime as needed for sleep.   ATORVASTATIN (LIPITOR) 20 MG TABLET    Take 1 tablet (20 mg total) by mouth daily.   CALCIUM CARBONATE-VITAMIN D (CALTRATE 600+D) 600-400 MG-UNIT PER TABLET    Take 1 tablet by mouth daily.     CARVEDILOL (COREG) 6.25 MG TABLET    Take 1 tablet (6.25 mg total) by mouth 2 (two) times daily.   ESTRADIOL (CLIMARA - DOSED IN MG/24 HR) 0.1 MG/24HR    Place 1 patch onto the skin once a week.     FUROSEMIDE (LASIX) 40 MG TABLET    Take 1 tablet (40 mg total) by mouth daily.   LEVOTHYROXINE (SYNTHROID, LEVOTHROID) 125 MCG TABLET    Take 1 tablet (125 mcg total) by mouth daily.   MECLIZINE (ANTIVERT) 25 MG TABLET    TAKE 1/2 OR 1 TABLET BY MOUTH EVERY 6 HOURS AS NEEDED FOR DIZZINESS   MELOXICAM (MOBIC) 7.5 MG TABLET    Take 1 tablet (7.5 mg total) by mouth daily.   MULTIPLE VITAMINS-MINERALS (CENTRUM SILVER PO)    Take 1 tablet by mouth daily.     POTASSIUM  CHLORIDE SA (K-DUR,KLOR-CON) 20 MEQ TABLET    Take 1 tablet (20 mEq total) by mouth 2 (two) times daily.   QUINAPRIL (ACCUPRIL) 20 MG TABLET    Take 1 tablet (20 mg total) by mouth daily.   SPIRONOLACTONE (ALDACTONE) 25 MG TABLET    TAKE 1 TABLET EVERY DAY   VITAMIN B-12 (CYANOCOBALAMIN) 1000 MCG TABLET    Take 1,000 mcg by mouth daily.     WARFARIN (COUMADIN) 5 MG TABLET    TAKE AS DIRECTED BY ANTICOAGULATION CLINIC  Modified Medications   No medications on file  Discontinued Medications   No medications on file

## 2013-10-20 NOTE — Patient Instructions (Signed)
Today we updated your med list in our EPIC system...    Continue your current medications the same...  We wrote a new prescription for TRAMADOL 50mg  to take up to 3 times daily as needed for pain...    You may also apply a deep heat cream or patch to the painful area to see if that gives you a measure of relief...  Today we rechecked your metabolic panel...    We will contact you w/ the results when available...   Have a very happy thanksgiving!!!  Call for any questions...  Let's plan a follow up visit in 50mo, sooner if needed for problems.Marland KitchenMarland Kitchen

## 2013-10-26 DIAGNOSIS — H26019 Infantile and juvenile cortical, lamellar, or zonular cataract, unspecified eye: Secondary | ICD-10-CM | POA: Diagnosis not present

## 2013-10-26 DIAGNOSIS — H2510 Age-related nuclear cataract, unspecified eye: Secondary | ICD-10-CM | POA: Diagnosis not present

## 2013-10-28 DIAGNOSIS — H2510 Age-related nuclear cataract, unspecified eye: Secondary | ICD-10-CM | POA: Diagnosis not present

## 2013-11-04 DIAGNOSIS — H2510 Age-related nuclear cataract, unspecified eye: Secondary | ICD-10-CM | POA: Diagnosis not present

## 2013-11-10 ENCOUNTER — Ambulatory Visit (INDEPENDENT_AMBULATORY_CARE_PROVIDER_SITE_OTHER): Payer: Medicare Other

## 2013-11-10 DIAGNOSIS — Z7901 Long term (current) use of anticoagulants: Secondary | ICD-10-CM | POA: Diagnosis not present

## 2013-11-10 DIAGNOSIS — I4891 Unspecified atrial fibrillation: Secondary | ICD-10-CM

## 2013-11-10 DIAGNOSIS — Z5181 Encounter for therapeutic drug level monitoring: Secondary | ICD-10-CM | POA: Diagnosis not present

## 2013-11-10 LAB — POCT INR: INR: 3

## 2013-11-11 DIAGNOSIS — H2510 Age-related nuclear cataract, unspecified eye: Secondary | ICD-10-CM | POA: Diagnosis not present

## 2013-11-11 DIAGNOSIS — H21569 Pupillary abnormality, unspecified eye: Secondary | ICD-10-CM | POA: Diagnosis not present

## 2013-11-11 DIAGNOSIS — H26019 Infantile and juvenile cortical, lamellar, or zonular cataract, unspecified eye: Secondary | ICD-10-CM | POA: Diagnosis not present

## 2013-12-02 ENCOUNTER — Other Ambulatory Visit: Payer: Self-pay | Admitting: Pulmonary Disease

## 2013-12-03 ENCOUNTER — Other Ambulatory Visit: Payer: Self-pay

## 2013-12-03 MED ORDER — POTASSIUM CHLORIDE CRYS ER 20 MEQ PO TBCR: 20.0000 meq | EXTENDED_RELEASE_TABLET | Freq: Two times a day (BID) | ORAL | Status: DC

## 2013-12-16 DIAGNOSIS — M545 Low back pain, unspecified: Secondary | ICD-10-CM | POA: Diagnosis not present

## 2013-12-22 ENCOUNTER — Ambulatory Visit (INDEPENDENT_AMBULATORY_CARE_PROVIDER_SITE_OTHER): Payer: Medicare Other | Admitting: Pharmacist

## 2013-12-22 DIAGNOSIS — I4891 Unspecified atrial fibrillation: Secondary | ICD-10-CM | POA: Diagnosis not present

## 2013-12-22 DIAGNOSIS — Z7901 Long term (current) use of anticoagulants: Secondary | ICD-10-CM | POA: Diagnosis not present

## 2013-12-22 LAB — POCT INR: INR: 3

## 2013-12-29 DIAGNOSIS — M7090 Unspecified soft tissue disorder related to use, overuse and pressure of unspecified site: Secondary | ICD-10-CM | POA: Diagnosis not present

## 2013-12-29 DIAGNOSIS — IMO0002 Reserved for concepts with insufficient information to code with codable children: Secondary | ICD-10-CM | POA: Diagnosis not present

## 2013-12-30 ENCOUNTER — Other Ambulatory Visit: Payer: Self-pay | Admitting: Cardiovascular Disease

## 2013-12-31 DIAGNOSIS — IMO0002 Reserved for concepts with insufficient information to code with codable children: Secondary | ICD-10-CM | POA: Diagnosis not present

## 2013-12-31 DIAGNOSIS — M7090 Unspecified soft tissue disorder related to use, overuse and pressure of unspecified site: Secondary | ICD-10-CM | POA: Diagnosis not present

## 2014-01-04 DIAGNOSIS — M545 Low back pain, unspecified: Secondary | ICD-10-CM | POA: Diagnosis not present

## 2014-01-04 DIAGNOSIS — IMO0002 Reserved for concepts with insufficient information to code with codable children: Secondary | ICD-10-CM | POA: Diagnosis not present

## 2014-01-06 ENCOUNTER — Encounter: Payer: Self-pay | Admitting: Cardiovascular Disease

## 2014-01-06 ENCOUNTER — Ambulatory Visit (INDEPENDENT_AMBULATORY_CARE_PROVIDER_SITE_OTHER): Payer: Medicare Other | Admitting: Cardiovascular Disease

## 2014-01-06 VITALS — BP 132/64 | HR 66 | Wt 193.0 lb

## 2014-01-06 DIAGNOSIS — E785 Hyperlipidemia, unspecified: Secondary | ICD-10-CM

## 2014-01-06 DIAGNOSIS — I4891 Unspecified atrial fibrillation: Secondary | ICD-10-CM | POA: Diagnosis not present

## 2014-01-06 DIAGNOSIS — I350 Nonrheumatic aortic (valve) stenosis: Secondary | ICD-10-CM

## 2014-01-06 DIAGNOSIS — I359 Nonrheumatic aortic valve disorder, unspecified: Secondary | ICD-10-CM

## 2014-01-06 DIAGNOSIS — Z79899 Other long term (current) drug therapy: Secondary | ICD-10-CM

## 2014-01-06 LAB — BASIC METABOLIC PANEL
BUN: 27 mg/dL — AB (ref 6–23)
CHLORIDE: 103 meq/L (ref 96–112)
CO2: 27 meq/L (ref 19–32)
Calcium: 9.2 mg/dL (ref 8.4–10.5)
Creatinine, Ser: 0.9 mg/dL (ref 0.4–1.2)
GFR: 62.78 mL/min (ref >60.00)
Glucose, Bld: 102 mg/dL — ABNORMAL HIGH (ref 70–99)
Potassium: 4.4 mEq/L (ref 3.5–5.1)
Sodium: 137 mEq/L (ref 135–145)

## 2014-01-06 NOTE — Assessment & Plan Note (Signed)
F/U echo 11/15  Murmur loud mild in 2014

## 2014-01-06 NOTE — Assessment & Plan Note (Signed)
Improved since she is not on her feet working as much.  Check BMET today since on both aldactone and K replacement

## 2014-01-06 NOTE — Assessment & Plan Note (Signed)
Good rate control and anticoagulation F/U coumadin clinic

## 2014-01-06 NOTE — Patient Instructions (Signed)
Your physician wants you to follow-up in:   November  WITH  DR Hudson  ECHO SAME D AY   You will receive a reminder letter in the mail two months in advance. If you don't receive a letter, please call our office to schedule the follow-up appointment. Your physician recommends that you continue on your current medications as directed. Please refer to the Current Medication list given to you today. Your physician recommends that you return for lab work in: TODAY   BMET  Your physician has requested that you have an echocardiogram. Echocardiography is a painless test that uses sound waves to create images of your heart. It provides your doctor with information about the size and shape of your heart and how well your heart's chambers and valves are working. This procedure takes approximately one hour. There are no restrictions for this procedure. DUE IN  [place]

## 2014-01-06 NOTE — Assessment & Plan Note (Signed)
Well controlled.  Continue current medications and low sodium Dash type diet.    

## 2014-01-06 NOTE — Addendum Note (Signed)
Addended by: Eulis Foster on: 01/06/2014 10:16 AM   Modules accepted: Orders

## 2014-01-06 NOTE — Progress Notes (Signed)
Patient ID: [name], female   DOB: 02/24/1929, 80 y.o.   MRN: [id number] [name] is seen today for f/U of afib, HTN, and edema. Her coumadin has been Rx. No palpitations, SOB or SSCP. She is walking on a regular basis without symptoms. She retired from [employer] 2 years ago . Her husband [name] died and she is now living in apartment near [place] has worked well as a diuretic for edema as she requires much less K. She had a splenic infarct when enrolled in the engage trial and we have not thought about changing her to Pradaxa  Recent right foot surgery by [name].  BP and pulse seem to be better today  Last visit beta blocker increased BP better but with less dyspnea and palpitations. Started back doing water aerobics at the [facility]   I care for her sister [name] who has had issues with edema and memory   Echo 09/29/13 with mild AS Study Conclusions  - Left ventricle: The cavity size was normal. Wall thickness was increased in a pattern of mild LVH. Systolic function was normal. The estimated ejection fraction was in the range of 55% to 60%. - Aortic valve: There was mild stenosis. 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 - Left atrium: The appendage was severely dilated. - Right atrium: The atrium was mildly dilated. - Atrial septum: No defect or patent foramen ovale was identified. - Tricuspid valve: Moderate regurgitation.     ROS: Denies fever, malais, weight loss, blurry vision, decreased visual acuity, cough, sputum, SOB, hemoptysis, pleuritic pain, palpitaitons, heartburn, abdominal pain, melena, lower extremity edema, claudication, or rash.  All other systems reviewed and negative  General: Affect appropriate Healthy:  appears stated age 8: normal Neck supple with no adenopathy JVP normal no bruits no thyromegaly Lungs clear with no wheezing and good diaphragmatic motion Heart:  S1/S2 AS  murmur, no rub, gallop or  click PMI normal Abdomen: benighn, BS positve, no tenderness, no AAA no bruit.  No HSM or HJR Distal pulses intact with no bruits No edema Neuro non-focal Skin warm and dry No muscular weakness   Current Outpatient Prescriptions  Medication Sig Dispense Refill  . ALPRAZolam (XANAX) 0.25 MG tablet Take 0.25 mg by mouth at bedtime as needed for sleep.      Marland Kitchen atorvastatin (LIPITOR) 20 MG tablet Take 1 tablet (20 mg total) by mouth daily.  90 tablet  3  . Calcium Carbonate-Vitamin D (CALTRATE 600+D) 600-400 MG-UNIT per tablet Take 1 tablet by mouth daily.        . carvedilol (COREG) 6.25 MG tablet Take 1 tablet (6.25 mg total) by mouth 2 (two) times daily.  180 tablet  3  . estradiol (CLIMARA - DOSED IN MG/24 HR) 0.1 mg/24hr Place 1 patch onto the skin once a week.        . furosemide (LASIX) 40 MG tablet Take 1 tablet (40 mg total) by mouth daily.  90 tablet  3  . levothyroxine (SYNTHROID, LEVOTHROID) 125 MCG tablet Take 1 tablet (125 mcg total) by mouth daily.  90 tablet  4  . meclizine (ANTIVERT) 25 MG tablet TAKE 1/2 OR 1 TABLET BY MOUTH EVERY 6 HOURS AS NEEDED FOR DIZZINESS  30 tablet  1  . meloxicam (MOBIC) 7.5 MG tablet TAKE 1 TABLET DAILY  90 tablet  2  . Multiple Vitamins-Minerals (CENTRUM SILVER PO) Take 1 tablet by mouth daily.        Marland Kitchen  potassium chloride SA (K-DUR,KLOR-CON) 20 MEQ tablet Take 1 tablet (20 mEq total) by mouth 2 (two) times daily.  60 tablet  6  . quinapril (ACCUPRIL) 20 MG tablet Take 1 tablet (20 mg total) by mouth daily.  90 tablet  1  . spironolactone (ALDACTONE) 25 MG tablet TAKE 1 TABLET DAILY  90 tablet  0  . traMADol (ULTRAM) 50 MG tablet Take 1 tablet (50 mg total) by mouth 3 (three) times daily as needed.  90 tablet  5  . vitamin B-12 (CYANOCOBALAMIN) 1000 MCG tablet Take 1,000 mcg by mouth daily.        Marland Kitchen warfarin (COUMADIN) 5 MG tablet TAKE AS DIRECTED BY ANTICOAGULATION CLINIC  120 tablet  1   No current facility-administered medications for this  visit.    Allergies  Codeine  Electrocardiogram: 03/05/13 afib rate 64 nonspecific ST/T wave changes   Assessment and Plan

## 2014-01-06 NOTE — Assessment & Plan Note (Signed)
Cholesterol is at goal.  Continue current dose of statin and diet Rx.  No myalgias or side effects.  F/U  LFT's in 6 months. Lab Results  Component Value Date   LDLCALC 84 04/15/2013

## 2014-01-07 DIAGNOSIS — M545 Low back pain, unspecified: Secondary | ICD-10-CM | POA: Diagnosis not present

## 2014-01-07 DIAGNOSIS — IMO0002 Reserved for concepts with insufficient information to code with codable children: Secondary | ICD-10-CM | POA: Diagnosis not present

## 2014-01-11 DIAGNOSIS — M545 Low back pain, unspecified: Secondary | ICD-10-CM | POA: Diagnosis not present

## 2014-01-14 DIAGNOSIS — IMO0002 Reserved for concepts with insufficient information to code with codable children: Secondary | ICD-10-CM | POA: Diagnosis not present

## 2014-01-14 DIAGNOSIS — M545 Low back pain, unspecified: Secondary | ICD-10-CM | POA: Diagnosis not present

## 2014-01-20 DIAGNOSIS — M545 Low back pain, unspecified: Secondary | ICD-10-CM | POA: Diagnosis not present

## 2014-01-21 ENCOUNTER — Other Ambulatory Visit: Payer: Self-pay | Admitting: Cardiovascular Disease

## 2014-01-28 DIAGNOSIS — M545 Low back pain, unspecified: Secondary | ICD-10-CM | POA: Diagnosis not present

## 2014-01-28 DIAGNOSIS — IMO0002 Reserved for concepts with insufficient information to code with codable children: Secondary | ICD-10-CM | POA: Diagnosis not present

## 2014-02-02 ENCOUNTER — Ambulatory Visit (INDEPENDENT_AMBULATORY_CARE_PROVIDER_SITE_OTHER): Payer: Medicare Other | Admitting: Pharmacist

## 2014-02-02 DIAGNOSIS — I4891 Unspecified atrial fibrillation: Secondary | ICD-10-CM

## 2014-02-02 DIAGNOSIS — Z7901 Long term (current) use of anticoagulants: Secondary | ICD-10-CM

## 2014-02-02 LAB — POCT INR: INR: 3.9

## 2014-02-08 ENCOUNTER — Other Ambulatory Visit: Payer: Self-pay | Admitting: Dermatology

## 2014-02-08 DIAGNOSIS — C44519 Basal cell carcinoma of skin of other part of trunk: Secondary | ICD-10-CM | POA: Diagnosis not present

## 2014-02-08 DIAGNOSIS — D485 Neoplasm of uncertain behavior of skin: Secondary | ICD-10-CM | POA: Diagnosis not present

## 2014-02-08 DIAGNOSIS — L820 Inflamed seborrheic keratosis: Secondary | ICD-10-CM | POA: Diagnosis not present

## 2014-02-08 DIAGNOSIS — Z85828 Personal history of other malignant neoplasm of skin: Secondary | ICD-10-CM | POA: Diagnosis not present

## 2014-02-08 DIAGNOSIS — L821 Other seborrheic keratosis: Secondary | ICD-10-CM | POA: Diagnosis not present

## 2014-02-08 DIAGNOSIS — L253 Unspecified contact dermatitis due to other chemical products: Secondary | ICD-10-CM | POA: Diagnosis not present

## 2014-02-08 DIAGNOSIS — L570 Actinic keratosis: Secondary | ICD-10-CM | POA: Diagnosis not present

## 2014-02-16 ENCOUNTER — Telehealth: Payer: Self-pay | Admitting: Pulmonary Disease

## 2014-02-16 NOTE — Telephone Encounter (Signed)
Pt is returning call.  

## 2014-02-16 NOTE — Telephone Encounter (Signed)
I called and spoke with pt. Made her aware SN is retiring from [facility]. We need to set her up with new PCP. Her pending appt has been cancelled. She will call [facility] for appt. Nothing further needed

## 2014-02-16 NOTE — Telephone Encounter (Signed)
lmtcb x1 

## 2014-02-25 ENCOUNTER — Ambulatory Visit (INDEPENDENT_AMBULATORY_CARE_PROVIDER_SITE_OTHER): Payer: Medicare Other

## 2014-02-25 DIAGNOSIS — H33019 Retinal detachment with single break, unspecified eye: Secondary | ICD-10-CM | POA: Diagnosis not present

## 2014-02-25 DIAGNOSIS — H26499 Other secondary cataract, unspecified eye: Secondary | ICD-10-CM | POA: Diagnosis not present

## 2014-02-25 DIAGNOSIS — Z7901 Long term (current) use of anticoagulants: Secondary | ICD-10-CM

## 2014-02-25 DIAGNOSIS — I4891 Unspecified atrial fibrillation: Secondary | ICD-10-CM

## 2014-02-25 DIAGNOSIS — H43819 Vitreous degeneration, unspecified eye: Secondary | ICD-10-CM | POA: Diagnosis not present

## 2014-02-25 DIAGNOSIS — H33059 Total retinal detachment, unspecified eye: Secondary | ICD-10-CM | POA: Diagnosis not present

## 2014-02-25 LAB — POCT INR: INR: 2.4

## 2014-03-01 DIAGNOSIS — H33029 Retinal detachment with multiple breaks, unspecified eye: Secondary | ICD-10-CM | POA: Diagnosis not present

## 2014-03-01 DIAGNOSIS — H547 Unspecified visual loss: Secondary | ICD-10-CM | POA: Diagnosis not present

## 2014-03-01 DIAGNOSIS — H33009 Unspecified retinal detachment with retinal break, unspecified eye: Secondary | ICD-10-CM | POA: Diagnosis not present

## 2014-03-01 HISTORY — PX: RETINAL DETACHMENT REPAIR W/ SCLERAL BUCKLE LE: SHX2338

## 2014-03-24 ENCOUNTER — Ambulatory Visit (INDEPENDENT_AMBULATORY_CARE_PROVIDER_SITE_OTHER): Payer: Medicare Other | Admitting: *Deleted

## 2014-03-24 ENCOUNTER — Ambulatory Visit (INDEPENDENT_AMBULATORY_CARE_PROVIDER_SITE_OTHER): Payer: Medicare Other | Admitting: Family Medicine

## 2014-03-24 ENCOUNTER — Encounter: Payer: Self-pay | Admitting: Family Medicine

## 2014-03-24 VITALS — BP 145/88 | HR 75 | Temp 98.5°F | Ht 70.5 in | Wt 192.0 lb

## 2014-03-24 DIAGNOSIS — G609 Hereditary and idiopathic neuropathy, unspecified: Secondary | ICD-10-CM | POA: Diagnosis not present

## 2014-03-24 DIAGNOSIS — I10 Essential (primary) hypertension: Secondary | ICD-10-CM

## 2014-03-24 DIAGNOSIS — Z7901 Long term (current) use of anticoagulants: Secondary | ICD-10-CM

## 2014-03-24 DIAGNOSIS — E739 Lactose intolerance, unspecified: Secondary | ICD-10-CM

## 2014-03-24 DIAGNOSIS — I4891 Unspecified atrial fibrillation: Secondary | ICD-10-CM

## 2014-03-24 DIAGNOSIS — E785 Hyperlipidemia, unspecified: Secondary | ICD-10-CM

## 2014-03-24 DIAGNOSIS — I739 Peripheral vascular disease, unspecified: Secondary | ICD-10-CM

## 2014-03-24 DIAGNOSIS — M1990 Unspecified osteoarthritis, unspecified site: Secondary | ICD-10-CM

## 2014-03-24 DIAGNOSIS — I350 Nonrheumatic aortic (valve) stenosis: Secondary | ICD-10-CM

## 2014-03-24 DIAGNOSIS — E039 Hypothyroidism, unspecified: Secondary | ICD-10-CM

## 2014-03-24 DIAGNOSIS — E559 Vitamin D deficiency, unspecified: Secondary | ICD-10-CM

## 2014-03-24 DIAGNOSIS — I359 Nonrheumatic aortic valve disorder, unspecified: Secondary | ICD-10-CM

## 2014-03-24 DIAGNOSIS — F411 Generalized anxiety disorder: Secondary | ICD-10-CM

## 2014-03-24 LAB — CBC WITH DIFFERENTIAL/PLATELET
BASOS ABS: 0 10*3/uL (ref 0.0–0.1)
Basophils Relative: 0.4 % (ref 0.0–3.0)
Eosinophils Absolute: 0.3 10*3/uL (ref 0.0–0.7)
Eosinophils Relative: 3.5 % (ref 0.0–5.0)
HCT: 40 % (ref 36.0–46.0)
Hemoglobin: 13.4 g/dL (ref 12.0–15.0)
LYMPHS PCT: 22.4 % (ref 12.0–46.0)
Lymphs Abs: 1.8 10*3/uL (ref 0.7–4.0)
MCHC: 33.6 g/dL (ref 30.0–36.0)
MCV: 95.8 fl (ref 78.0–100.0)
MONOS PCT: 7.2 % (ref 3.0–12.0)
Monocytes Absolute: 0.6 10*3/uL (ref 0.1–1.0)
NEUTROS PCT: 66.5 % (ref 43.0–77.0)
Neutro Abs: 5.5 10*3/uL (ref 1.4–7.7)
Platelets: 252 10*3/uL (ref 150.0–400.0)
RBC: 4.17 Mil/uL (ref 3.87–5.11)
RDW: 12.8 % (ref 11.5–14.6)
WBC: 8.2 10*3/uL (ref 4.5–10.5)

## 2014-03-24 LAB — BASIC METABOLIC PANEL
BUN: 28 mg/dL — ABNORMAL HIGH (ref 6–23)
CALCIUM: 9.6 mg/dL (ref 8.4–10.5)
CO2: 27 mEq/L (ref 19–32)
Chloride: 105 mEq/L (ref 96–112)
Creatinine, Ser: 0.8 mg/dL (ref 0.4–1.2)
GFR: 72.8 mL/min (ref >60.00)
Glucose, Bld: 92 mg/dL (ref 70–99)
Potassium: 4.5 mEq/L (ref 3.5–5.1)
Sodium: 139 mEq/L (ref 135–145)

## 2014-03-24 LAB — LIPID PANEL
CHOL/HDL RATIO: 3
Cholesterol: 151 mg/dL (ref 0–200)
HDL: 46.7 mg/dL (ref >39.00)
LDL Cholesterol: 75 mg/dL (ref 0–99)
Triglycerides: 146 mg/dL (ref 0.0–149.0)
VLDL: 29.2 mg/dL (ref 0.0–40.0)

## 2014-03-24 LAB — HEPATIC FUNCTION PANEL
ALBUMIN: 4.5 g/dL (ref 3.5–5.2)
ALT: 21 U/L (ref 0–35)
AST: 24 U/L (ref 0–37)
Alkaline Phosphatase: 56 U/L (ref 39–117)
Bilirubin, Direct: 0.2 mg/dL (ref 0.0–0.3)
TOTAL PROTEIN: 7.3 g/dL (ref 6.0–8.3)
Total Bilirubin: 1.3 mg/dL — ABNORMAL HIGH (ref 0.3–1.2)

## 2014-03-24 LAB — POCT INR: INR: 3.3

## 2014-03-24 LAB — TSH: TSH: 0.62 u[IU]/mL (ref 0.35–5.50)

## 2014-03-24 LAB — HEMOGLOBIN A1C: HEMOGLOBIN A1C: 6.2 % (ref 4.6–6.5)

## 2014-03-24 MED ORDER — MECLIZINE HCL 25 MG PO TABS: ORAL_TABLET | ORAL | Status: DC

## 2014-03-24 MED ORDER — POTASSIUM CHLORIDE CRYS ER 20 MEQ PO TBCR: 20.0000 meq | EXTENDED_RELEASE_TABLET | Freq: Two times a day (BID) | ORAL | Status: DC

## 2014-03-24 MED ORDER — ALPRAZOLAM 0.25 MG PO TABS: 0.2500 mg | ORAL_TABLET | Freq: Every evening | ORAL | Status: DC | PRN

## 2014-03-24 NOTE — Progress Notes (Signed)
   Subjective:    Patient ID: [name], female    DOB: 10-23-1929, 80 y.o.   MRN: [id number]  HPI 80 yr old female establishing with [place] after transferring from Dr. Lenna Gilford. She is doing well today although she is still recovering from surgery to repair a torn retina in the right eye on 03-01-14. This went well and her vision is almost back to normal. She is not driving yet but plans to return to driving sometime soon. She is fasting for labs. She sees Dr. Johnsie Cancel twice a year for atrial fib and aortic stenosis, and these seem to be stable. He will see her again in November and he plans to repeat an ECHO at that time. Until her recent eye surgery she had been walking every day and going to water aerobics several days a week, and she plans to resume these activities soon.    Review of Systems  Constitutional: Negative.   Eyes: Negative.   Respiratory: Negative.   Cardiovascular: Negative.   Neurological: Negative.        Objective:   Physical Exam  Constitutional: She appears well-developed and well-nourished. No distress.  Neck: No thyromegaly present.  Cardiovascular: Normal rate and intact distal pulses.  Exam reveals no gallop and no friction rub.   Murmur heard. Irregular rhythm. She has a 2/6 SM loudest over the aortic area  Lymphadenopathy:    She has no cervical adenopathy.          Assessment & Plan:  She seems to be doing well. Get labs today

## 2014-03-24 NOTE — Progress Notes (Signed)
Pre visit review using our clinic review tool, if applicable. No additional management support is needed unless otherwise documented below in the visit note. 

## 2014-03-25 LAB — VITAMIN D 25 HYDROXY (VIT D DEFICIENCY, FRACTURES): Vit D, 25-Hydroxy: 51 ng/mL (ref 30–89)

## 2014-04-01 ENCOUNTER — Telehealth: Payer: Self-pay | Admitting: *Deleted

## 2014-04-01 MED ORDER — ATORVASTATIN CALCIUM 20 MG PO TABS: 20.0000 mg | ORAL_TABLET | Freq: Every day | ORAL | Status: DC

## 2014-04-01 MED ORDER — SPIRONOLACTONE 25 MG PO TABS: 25.0000 mg | ORAL_TABLET | Freq: Every day | ORAL | Status: DC

## 2014-04-01 NOTE — Telephone Encounter (Signed)
Called for 2 refills

## 2014-04-08 ENCOUNTER — Other Ambulatory Visit: Payer: Self-pay

## 2014-04-08 ENCOUNTER — Telehealth: Payer: Self-pay

## 2014-04-08 MED ORDER — WARFARIN SODIUM 5 MG PO TABS: ORAL_TABLET | ORAL | Status: DC

## 2014-04-08 MED ORDER — QUINAPRIL HCL 20 MG PO TABS: 20.0000 mg | ORAL_TABLET | Freq: Every day | ORAL | Status: DC

## 2014-04-08 NOTE — Telephone Encounter (Signed)
[facility]

## 2014-04-09 ENCOUNTER — Ambulatory Visit (INDEPENDENT_AMBULATORY_CARE_PROVIDER_SITE_OTHER): Payer: Medicare Other | Admitting: *Deleted

## 2014-04-09 DIAGNOSIS — I4891 Unspecified atrial fibrillation: Secondary | ICD-10-CM | POA: Diagnosis not present

## 2014-04-09 DIAGNOSIS — Z7901 Long term (current) use of anticoagulants: Secondary | ICD-10-CM | POA: Diagnosis not present

## 2014-04-09 LAB — POCT INR: INR: 2.7

## 2014-04-21 ENCOUNTER — Ambulatory Visit: Payer: Medicare Other | Admitting: Pulmonary Disease

## 2014-04-27 ENCOUNTER — Ambulatory Visit (INDEPENDENT_AMBULATORY_CARE_PROVIDER_SITE_OTHER): Payer: Medicare Other | Admitting: *Deleted

## 2014-04-27 DIAGNOSIS — I4891 Unspecified atrial fibrillation: Secondary | ICD-10-CM

## 2014-04-27 DIAGNOSIS — H43819 Vitreous degeneration, unspecified eye: Secondary | ICD-10-CM | POA: Diagnosis not present

## 2014-04-27 DIAGNOSIS — Z7901 Long term (current) use of anticoagulants: Secondary | ICD-10-CM

## 2014-04-27 LAB — POCT INR: INR: 3.3

## 2014-05-03 DIAGNOSIS — Z1231 Encounter for screening mammogram for malignant neoplasm of breast: Secondary | ICD-10-CM | POA: Diagnosis not present

## 2014-05-03 DIAGNOSIS — Z01419 Encounter for gynecological examination (general) (routine) without abnormal findings: Secondary | ICD-10-CM | POA: Diagnosis not present

## 2014-05-03 DIAGNOSIS — Z1212 Encounter for screening for malignant neoplasm of rectum: Secondary | ICD-10-CM | POA: Diagnosis not present

## 2014-05-03 DIAGNOSIS — Z130 Encounter for screening for diseases of the blood and blood-forming organs and certain disorders involving the immune mechanism: Secondary | ICD-10-CM | POA: Diagnosis not present

## 2014-05-11 ENCOUNTER — Ambulatory Visit (INDEPENDENT_AMBULATORY_CARE_PROVIDER_SITE_OTHER): Payer: Medicare Other

## 2014-05-11 DIAGNOSIS — I4891 Unspecified atrial fibrillation: Secondary | ICD-10-CM

## 2014-05-11 DIAGNOSIS — Z7901 Long term (current) use of anticoagulants: Secondary | ICD-10-CM

## 2014-05-11 LAB — POCT INR: INR: 3

## 2014-06-02 ENCOUNTER — Ambulatory Visit (INDEPENDENT_AMBULATORY_CARE_PROVIDER_SITE_OTHER): Payer: Medicare Other | Admitting: Surgery

## 2014-06-02 DIAGNOSIS — Z7901 Long term (current) use of anticoagulants: Secondary | ICD-10-CM

## 2014-06-02 DIAGNOSIS — I4891 Unspecified atrial fibrillation: Secondary | ICD-10-CM | POA: Diagnosis not present

## 2014-06-02 LAB — POCT INR: INR: 2.9

## 2014-06-29 ENCOUNTER — Ambulatory Visit (INDEPENDENT_AMBULATORY_CARE_PROVIDER_SITE_OTHER): Payer: Medicare Other

## 2014-06-29 DIAGNOSIS — I4891 Unspecified atrial fibrillation: Secondary | ICD-10-CM

## 2014-06-29 DIAGNOSIS — Z7901 Long term (current) use of anticoagulants: Secondary | ICD-10-CM

## 2014-06-29 LAB — POCT INR: INR: 2.6

## 2014-07-05 ENCOUNTER — Encounter: Payer: Self-pay | Admitting: Family Medicine

## 2014-07-05 ENCOUNTER — Other Ambulatory Visit: Payer: Self-pay

## 2014-07-05 ENCOUNTER — Ambulatory Visit (INDEPENDENT_AMBULATORY_CARE_PROVIDER_SITE_OTHER): Payer: Medicare Other | Admitting: Family Medicine

## 2014-07-05 VITALS — BP 126/78 | HR 68 | Temp 98.6°F | Ht 70.5 in | Wt 195.0 lb

## 2014-07-05 DIAGNOSIS — H33059 Total retinal detachment, unspecified eye: Secondary | ICD-10-CM | POA: Diagnosis not present

## 2014-07-05 DIAGNOSIS — E785 Hyperlipidemia, unspecified: Secondary | ICD-10-CM

## 2014-07-05 DIAGNOSIS — I4891 Unspecified atrial fibrillation: Secondary | ICD-10-CM

## 2014-07-05 DIAGNOSIS — E039 Hypothyroidism, unspecified: Secondary | ICD-10-CM

## 2014-07-05 DIAGNOSIS — I10 Essential (primary) hypertension: Secondary | ICD-10-CM

## 2014-07-05 DIAGNOSIS — H33019 Retinal detachment with single break, unspecified eye: Secondary | ICD-10-CM | POA: Diagnosis not present

## 2014-07-05 DIAGNOSIS — H26499 Other secondary cataract, unspecified eye: Secondary | ICD-10-CM | POA: Diagnosis not present

## 2014-07-05 MED ORDER — POTASSIUM CHLORIDE CRYS ER 20 MEQ PO TBCR: 20.0000 meq | EXTENDED_RELEASE_TABLET | Freq: Two times a day (BID) | ORAL | Status: DC

## 2014-07-05 NOTE — Progress Notes (Signed)
   Subjective:    Patient ID: [name], female    DOB: May 28, 1929, 80 y.o.   MRN: [id number]  HPI Here to follow up. She feels great and has no concerns. She recently attended her 65th high school reunion.    Review of Systems  Constitutional: Negative.   Respiratory: Negative.   Cardiovascular: Negative.        Objective:   Physical Exam  Constitutional: She appears well-developed and well-nourished.  Cardiovascular: Normal rate, normal heart sounds and intact distal pulses.   Irregular rhythm. Has her usual murmur  Pulmonary/Chest: Effort normal and breath sounds normal. No respiratory distress. She has no wheezes. She has no rales.  Musculoskeletal: She exhibits no edema.          Assessment & Plan:  She seems to be doing quite well. She will get her flu shot next month as usual. She will see Dr. Johnsie Cancel in November

## 2014-07-05 NOTE — Progress Notes (Signed)
Pre visit review using our clinic review tool, if applicable. No additional management support is needed unless otherwise documented below in the visit note. 

## 2014-07-06 ENCOUNTER — Telehealth: Payer: Self-pay | Admitting: Family Medicine

## 2014-07-06 NOTE — Telephone Encounter (Signed)
Relevant patient education mailed to patient.  

## 2014-07-14 ENCOUNTER — Other Ambulatory Visit: Payer: Self-pay | Admitting: Dermatology

## 2014-07-14 DIAGNOSIS — L821 Other seborrheic keratosis: Secondary | ICD-10-CM | POA: Diagnosis not present

## 2014-07-14 DIAGNOSIS — L570 Actinic keratosis: Secondary | ICD-10-CM | POA: Diagnosis not present

## 2014-07-14 DIAGNOSIS — D485 Neoplasm of uncertain behavior of skin: Secondary | ICD-10-CM | POA: Diagnosis not present

## 2014-07-14 DIAGNOSIS — Z85828 Personal history of other malignant neoplasm of skin: Secondary | ICD-10-CM | POA: Diagnosis not present

## 2014-07-19 DIAGNOSIS — H26499 Other secondary cataract, unspecified eye: Secondary | ICD-10-CM | POA: Diagnosis not present

## 2014-07-19 DIAGNOSIS — Z961 Presence of intraocular lens: Secondary | ICD-10-CM | POA: Diagnosis not present

## 2014-07-21 ENCOUNTER — Other Ambulatory Visit: Payer: Self-pay | Admitting: *Deleted

## 2014-07-21 MED ORDER — CARVEDILOL 6.25 MG PO TABS: ORAL_TABLET | ORAL | Status: DC

## 2014-07-28 DIAGNOSIS — H26499 Other secondary cataract, unspecified eye: Secondary | ICD-10-CM | POA: Diagnosis not present

## 2014-08-06 ENCOUNTER — Other Ambulatory Visit: Payer: Self-pay | Admitting: Pulmonary Disease

## 2014-08-09 ENCOUNTER — Ambulatory Visit (INDEPENDENT_AMBULATORY_CARE_PROVIDER_SITE_OTHER): Payer: Medicare Other

## 2014-08-09 DIAGNOSIS — Z7901 Long term (current) use of anticoagulants: Secondary | ICD-10-CM

## 2014-08-09 DIAGNOSIS — I4891 Unspecified atrial fibrillation: Secondary | ICD-10-CM

## 2014-08-09 LAB — POCT INR: INR: 2.7

## 2014-08-13 ENCOUNTER — Telehealth: Payer: Self-pay | Admitting: Pulmonary Disease

## 2014-08-13 NOTE — Telephone Encounter (Signed)
Pt needs to schedule appt  LMTCB

## 2014-08-16 NOTE — Telephone Encounter (Signed)
This will need to sent to Dr. Alysia Penna since he is her new PCP.   [facility] yet.  Will call back.

## 2014-08-17 ENCOUNTER — Telehealth: Payer: Self-pay | Admitting: Family Medicine

## 2014-08-17 NOTE — Telephone Encounter (Signed)
[facility] for [facility] to request meloxicam (MOBIC) 7.5 MG tablet 90 day

## 2014-08-17 NOTE — Telephone Encounter (Signed)
Refill request for Meloxicam 7.5 mg and send to [facility].

## 2014-08-17 NOTE — Telephone Encounter (Signed)
Refused      Disp Refills Start End    meloxicam (MOBIC) 7.5 MG tablet [Pharmacy Med Name: MELOXICAM TABS 7.5MG ] 90 tablet 1 08/06/2014     Sig: TAKE 1 TABLET DAILY    Class: Normal    DAW: No    Reason for Refusal: Patient no longer under prescriber care    Reason for Refusal Comment: send refills to Dr. Alysia Penna, new PCP    Refused By: Elie Confer, CMA    This denial was sent to [facility] on 08-06-14. I called and spoke with rep at [facility] to make sure they send Rx to Dr Alysia Penna.  Rep confirmed they do have the updated information and will send Rx's to Dr Barbie Banner office. Nothing more needed at this time.

## 2014-08-18 MED ORDER — MELOXICAM 7.5 MG PO TABS: ORAL_TABLET | ORAL | Status: DC

## 2014-08-18 NOTE — Telephone Encounter (Signed)
Script was sent e-scribe 

## 2014-08-18 NOTE — Telephone Encounter (Signed)
I sent script e-scribe. 

## 2014-08-25 DIAGNOSIS — Z23 Encounter for immunization: Secondary | ICD-10-CM | POA: Diagnosis not present

## 2014-09-12 ENCOUNTER — Emergency Department (HOSPITAL_BASED_OUTPATIENT_CLINIC_OR_DEPARTMENT_OTHER)
Admission: EM | Admit: 2014-09-12 | Discharge: 2014-09-12 | Disposition: A | Discharge status: Home / Self Care | Payer: Medicare Other | Attending: Emergency Medicine | Admitting: Emergency Medicine

## 2014-09-12 ENCOUNTER — Encounter (HOSPITAL_BASED_OUTPATIENT_CLINIC_OR_DEPARTMENT_OTHER): Payer: Self-pay | Admitting: Emergency Medicine

## 2014-09-12 DIAGNOSIS — M810 Age-related osteoporosis without current pathological fracture: Secondary | ICD-10-CM | POA: Insufficient documentation

## 2014-09-12 DIAGNOSIS — E039 Hypothyroidism, unspecified: Secondary | ICD-10-CM | POA: Diagnosis not present

## 2014-09-12 DIAGNOSIS — Q211 Atrial septal defect: Secondary | ICD-10-CM | POA: Insufficient documentation

## 2014-09-12 DIAGNOSIS — R791 Abnormal coagulation profile: Secondary | ICD-10-CM | POA: Diagnosis not present

## 2014-09-12 DIAGNOSIS — Z8719 Personal history of other diseases of the digestive system: Secondary | ICD-10-CM | POA: Insufficient documentation

## 2014-09-12 DIAGNOSIS — E538 Deficiency of other specified B group vitamins: Secondary | ICD-10-CM | POA: Insufficient documentation

## 2014-09-12 DIAGNOSIS — G8929 Other chronic pain: Secondary | ICD-10-CM | POA: Diagnosis not present

## 2014-09-12 DIAGNOSIS — Z8541 Personal history of malignant neoplasm of cervix uteri: Secondary | ICD-10-CM | POA: Diagnosis not present

## 2014-09-12 DIAGNOSIS — Z79899 Other long term (current) drug therapy: Secondary | ICD-10-CM | POA: Insufficient documentation

## 2014-09-12 DIAGNOSIS — E785 Hyperlipidemia, unspecified: Secondary | ICD-10-CM | POA: Insufficient documentation

## 2014-09-12 DIAGNOSIS — Z791 Long term (current) use of non-steroidal anti-inflammatories (NSAID): Secondary | ICD-10-CM | POA: Insufficient documentation

## 2014-09-12 DIAGNOSIS — I739 Peripheral vascular disease, unspecified: Secondary | ICD-10-CM | POA: Insufficient documentation

## 2014-09-12 DIAGNOSIS — I10 Essential (primary) hypertension: Secondary | ICD-10-CM | POA: Insufficient documentation

## 2014-09-12 DIAGNOSIS — Z7901 Long term (current) use of anticoagulants: Secondary | ICD-10-CM | POA: Diagnosis not present

## 2014-09-12 DIAGNOSIS — I4891 Unspecified atrial fibrillation: Secondary | ICD-10-CM | POA: Insufficient documentation

## 2014-09-12 DIAGNOSIS — F419 Anxiety disorder, unspecified: Secondary | ICD-10-CM | POA: Diagnosis not present

## 2014-09-12 DIAGNOSIS — L814 Other melanin hyperpigmentation: Secondary | ICD-10-CM | POA: Insufficient documentation

## 2014-09-12 DIAGNOSIS — Z85038 Personal history of other malignant neoplasm of large intestine: Secondary | ICD-10-CM | POA: Diagnosis not present

## 2014-09-12 DIAGNOSIS — D688 Other specified coagulation defects: Secondary | ICD-10-CM | POA: Diagnosis not present

## 2014-09-12 DIAGNOSIS — R21 Rash and other nonspecific skin eruption: Secondary | ICD-10-CM | POA: Insufficient documentation

## 2014-09-12 DIAGNOSIS — Z86010 Personal history of colonic polyps: Secondary | ICD-10-CM | POA: Insufficient documentation

## 2014-09-12 LAB — URINE MICROSCOPIC-ADD ON

## 2014-09-12 LAB — COMPREHENSIVE METABOLIC PANEL
ALBUMIN: 4 g/dL (ref 3.5–5.2)
ALT: 16 U/L (ref 0–35)
AST: 21 U/L (ref 0–37)
Alkaline Phosphatase: 70 U/L (ref 39–117)
Anion gap: 15 (ref 5–15)
BUN: 34 mg/dL — AB (ref 6–23)
CALCIUM: 9.6 mg/dL (ref 8.4–10.5)
CO2: 24 mEq/L (ref 19–32)
Chloride: 101 mEq/L (ref 96–112)
Creatinine, Ser: 1.1 mg/dL (ref 0.50–1.10)
GFR calc non Af Amer: 45 mL/min — ABNORMAL LOW (ref >90)
GFR, EST AFRICAN AMERICAN: 52 mL/min — AB (ref >90)
GLUCOSE: 135 mg/dL — AB (ref 70–99)
Potassium: 4.7 mEq/L (ref 3.7–5.3)
Sodium: 140 mEq/L (ref 137–147)
TOTAL PROTEIN: 7.4 g/dL (ref 6.0–8.3)
Total Bilirubin: 0.5 mg/dL (ref 0.3–1.2)

## 2014-09-12 LAB — CBC WITH DIFFERENTIAL/PLATELET
Basophils Absolute: 0.1 10*3/uL (ref 0.0–0.1)
Basophils Relative: 1 % (ref 0–1)
Eosinophils Absolute: 0.5 10*3/uL (ref 0.0–0.7)
Eosinophils Relative: 7 % — ABNORMAL HIGH (ref 0–5)
HEMATOCRIT: 38.5 % (ref 36.0–46.0)
HEMOGLOBIN: 12.6 g/dL (ref 12.0–15.0)
LYMPHS PCT: 24 % (ref 12–46)
Lymphs Abs: 1.9 10*3/uL (ref 0.7–4.0)
MCH: 31.7 pg (ref 26.0–34.0)
MCHC: 32.7 g/dL (ref 30.0–36.0)
MCV: 97 fL (ref 78.0–100.0)
MONOS PCT: 8 % (ref 3–12)
Monocytes Absolute: 0.7 10*3/uL (ref 0.1–1.0)
NEUTROS ABS: 4.7 10*3/uL (ref 1.7–7.7)
NEUTROS PCT: 60 % (ref 43–77)
Platelets: 210 10*3/uL (ref 150–400)
RBC: 3.97 MIL/uL (ref 3.87–5.11)
RDW: 12.8 % (ref 11.5–15.5)
WBC: 7.9 10*3/uL (ref 4.0–10.5)

## 2014-09-12 LAB — URINALYSIS, ROUTINE W REFLEX MICROSCOPIC
BILIRUBIN URINE: NEGATIVE
Glucose, UA: NEGATIVE mg/dL
Ketones, ur: NEGATIVE mg/dL
NITRITE: NEGATIVE
Protein, ur: NEGATIVE mg/dL
SPECIFIC GRAVITY, URINE: 1.011 (ref 1.005–1.030)
Urobilinogen, UA: 0.2 mg/dL (ref 0.0–1.0)
pH: 5 (ref 5.0–8.0)

## 2014-09-12 LAB — PROTIME-INR
INR: 4.07 — AB (ref 0.00–1.49)
Prothrombin Time: 39.5 seconds — ABNORMAL HIGH (ref 11.6–15.2)

## 2014-09-12 LAB — SEDIMENTATION RATE: Sed Rate: 15 mm/hr (ref 0–22)

## 2014-09-12 LAB — APTT: aPTT: 81 seconds — ABNORMAL HIGH (ref 24–37)

## 2014-09-12 MED ORDER — DIPHENHYDRAMINE HCL 25 MG PO TABS: 25.0000 mg | ORAL_TABLET | ORAL | Status: DC | PRN

## 2014-09-12 NOTE — ED Notes (Signed)
Patient states she has a rash on both legs that started a few days ago and has moved up and is now on her arms as well.

## 2014-09-12 NOTE — ED Provider Notes (Signed)
CSN: [id number]     Arrival date & time 09/12/14  1657 History  This chart was scribed for Charlesetta Shanks, MD by Peyton Bottoms, ED Scribe. This patient was seen in room MH03/MH03 and the patient's care was started at 5:48 PM.   Chief Complaint  Patient presents with  . Rash   Patient is a 80 y.o. female presenting with rash. The history is provided by the patient. No language interpreter was used.  Rash   HPI Comments: [name] is a 80 y.o. female, with a history of shingles and neuropathy on her lower legs and feet, her who presents to the Emergency Department complaining of a moderate, itchy, burning rash with redness on lower legs bilaterally, and a mild rash to her upper legs and forearms bilaterally. Patient reports that she initially noticed the erythematous rash on her lower left leg 4-5 days ago while in the shower.  She reports that the rash spread to her right lower leg. She also reports mild spreading to her upper legs bilaterally. Patient states that she woke up this morning and noticed mild spreading of the rash to her forearms bilaterally. The rash on her lower legs is different than that noticed in her upper legs and forearms. Patient states that the rash in her lower legs was not as itchy or burning as the rash in her upper legs and forearms.  Patient states that her rash was itchy this morning, but denies itchiness currently. Patient denies any changes to medications, soaps, lotions. Patient states that she currently takes lasix medication and reports increased urine frequency. Patient states that she got a flu shot 2 weeks ago. Patient denies associated fevers, chills, cough, rhinorrhea, symptoms of pneumonia, bronchitis, abdominal pain, emesis, diarrhea, headache, vaginal pain, vaginal discharge, or history of bleeding easily.  Past Medical History  Diagnosis Date  . Hypertension   . Chest pain, unspecified   . Atrial fibrillation   . Mitral valve disorder   . Patent  foramen ovale   . Peripheral vascular disease   . Venous insufficiency   . Hyperlipidemia   . Impaired glucose tolerance   . Hypothyroid   . Diverticulosis of colon   . History of colonic polyps   . History of colon cancer   . Splenic infarction   . History of uterine cancer   . UTI (lower urinary tract infection)   . Osteoarthritis   . Chronic low back pain   . Osteoporosis   . Peripheral neuropathy   . Anxiety   . Vitamin B12 deficiency   . History of colonoscopy    Past Surgical History  Procedure Laterality Date  . Hysterectomy for uterine cancer    . Anterior and posterior vaginal repair  03/2006    Dr.Neal  . Basal cell skin cancer moh's surgery    . Right hemicolectomy for colon cancer  1994  . Lumbar laminectomy for spinal stenosis  2006    L3-4  . Left carpal tunnel surgery  02/2007    Dr. Daylene Katayama  . Retinal detachment repair w/ scleral buckle le Right 03-01-14    per Dr. Zadie Rhine   . Colonoscopy  05-25-10    per Dr. Henrene Pastor, benign polyps, repeat in 5 yrs   . Eye surgery     Family History  Problem Relation Age of Onset  . Cancer Mother     deceased age 16  . Heart failure Father     deceased age 32  . Cancer Brother  deceased age 62  . Stroke Sister     and heart problems; deceased age 61   History  Substance Use Topics  . Smoking status: Never Smoker   . Smokeless tobacco: Never Used  . Alcohol Use: Yes     Comment: occ   OB History   Grav Para Term Preterm Abortions TAB SAB Ect Mult Living                 Review of Systems  Skin: Positive for rash.    10 Systems reviewed and all are negative for acute change except as noted in the HPI.  Allergies  Codeine  Home Medications   Prior to Admission medications   Medication Sig Start Date End Date Taking? Authorizing Provider  ALPRAZolam (XANAX) 0.25 MG tablet Take 1 tablet (0.25 mg total) by mouth at bedtime as needed for sleep. 03/24/14   Laurey Morale, MD  atorvastatin (LIPITOR) 20 MG tablet  Take 1 tablet (20 mg total) by mouth daily. 04/01/14 06/03/15  Josue Hector, MD  Calcium Carbonate-Vitamin D (CALTRATE 600+D) 600-400 MG-UNIT per tablet Take 1 tablet by mouth daily.      Historical Provider, MD  carvedilol (COREG) 6.25 MG tablet TAKE 1 TABLET TWICE A DAY 07/21/14   Josue Hector, MD  diphenhydrAMINE (BENADRYL) 25 MG tablet Take 1 tablet (25 mg total) by mouth every 4 (four) hours as needed for itching. 09/12/14   Charlesetta Shanks, MD  estradiol (CLIMARA - DOSED IN MG/24 HR) 0.1 mg/24hr Place 1 patch onto the skin once a week.      Historical Provider, MD  furosemide (LASIX) 40 MG tablet TAKE 1 TABLET DAILY    Josue Hector, MD  levothyroxine (SYNTHROID, LEVOTHROID) 125 MCG tablet Take 1 tablet (125 mcg total) by mouth daily. 09/10/13   Josue Hector, MD  meclizine (ANTIVERT) 25 MG tablet TAKE 1/2 OR 1 TABLET BY MOUTH EVERY 6 HOURS AS NEEDED FOR DIZZINESS 03/24/14   Laurey Morale, MD  meloxicam (MOBIC) 7.5 MG tablet TAKE 1 TABLET DAILY 08/18/14   Laurey Morale, MD  Multiple Vitamins-Minerals (CENTRUM SILVER PO) Take 1 tablet by mouth daily.      Historical Provider, MD  potassium chloride SA (K-DUR,KLOR-CON) 20 MEQ tablet Take 1 tablet (20 mEq total) by mouth 2 (two) times daily. 07/05/14   Josue Hector, MD  quinapril (ACCUPRIL) 20 MG tablet Take 1 tablet (20 mg total) by mouth daily. 04/08/14   Josue Hector, MD  spironolactone (ALDACTONE) 25 MG tablet Take 1 tablet (25 mg total) by mouth daily. 04/01/14   Josue Hector, MD  vitamin B-12 (CYANOCOBALAMIN) 1000 MCG tablet Take 1,000 mcg by mouth daily.      Historical Provider, MD  warfarin (COUMADIN) 5 MG tablet TAKE AS DIRECTED BY ANTICOAGULATION CLINIC 04/08/14   Josue Hector, MD   Triage Vitals: BP 148/94  Pulse 83  Temp(Src) 98.3 F (36.8 C) (Oral)  Resp 20  SpO2 98%  Physical Exam  Constitutional: She is oriented to person, place, and time. She appears well-developed and well-nourished.  HENT:  Head: Normocephalic and  atraumatic.  Eyes: EOM are normal. Pupils are equal, round, and reactive to light.  Neck: Neck supple.  Cardiovascular: Normal rate, regular rhythm, normal heart sounds and intact distal pulses.   Pulmonary/Chest: Effort normal and breath sounds normal.  Abdominal: Soft. Bowel sounds are normal. She exhibits no distension. There is no tenderness.  Musculoskeletal: Normal range  of motion. She exhibits no edema.  Neurological: She is alert and oriented to person, place, and time. She has normal strength. Coordination normal. GCS eye subscore is 4. GCS verbal subscore is 5. GCS motor subscore is 6.  Skin: Skin is warm, dry and intact. Rash noted.  The patient has brown hyperpigmentation of both of her lower legs. This was a chronic change that occurred after an episode of edema some years ago. At the edges of this she has a serpiginous erythematous rash. It more or less follows the edges of the chronic hyperpigmented area. It has the appearance of a vasculitis. On her thighs she has a very mild papular rash with very faint pink tinged approximately 3-5 mm lesions. These are fairly diffuse. They do not extend up onto the abdomen or trunk. On the forearms as well there are some fairly faint 3-5 mm slightly raised plaque lesions. The face and eyes and mouth are free of any type of fracture swelling.  Psychiatric: She has a normal mood and affect.    ED Course  Procedures (including critical care time)  DIAGNOSTIC STUDIES: Oxygen Saturation is 98% on RA, normal by my interpretation.    COORDINATION OF CARE: 6:01 PM- Discussed plans to give patient Prednisone for itching and inflammation. Will order diagnostic lab work to check platelet levels. Pt advised of plan for treatment and pt agrees.  Labs Review Labs Reviewed  COMPREHENSIVE METABOLIC PANEL - Abnormal; Notable for the following:    Glucose, Bld 135 (*)    BUN 34 (*)    GFR calc non Af Amer 45 (*)    GFR calc Af Amer 52 (*)    All other  components within normal limits  CBC WITH DIFFERENTIAL - Abnormal; Notable for the following:    Eosinophils Relative 7 (*)    All other components within normal limits  APTT - Abnormal; Notable for the following:    aPTT 81 (*)    All other components within normal limits  PROTIME-INR - Abnormal; Notable for the following:    Prothrombin Time 39.5 (*)    INR 4.07 (*)    All other components within normal limits  URINALYSIS, ROUTINE W REFLEX MICROSCOPIC - Abnormal; Notable for the following:    Hgb urine dipstick SMALL (*)    Leukocytes, UA TRACE (*)    All other components within normal limits  URINE MICROSCOPIC-ADD ON - Abnormal; Notable for the following:    Bacteria, UA MANY (*)    All other components within normal limits  URINE CULTURE  SEDIMENTATION RATE    Imaging Review No results found.   EKG Interpretation None     MDM   Final diagnoses:  Rash and nonspecific skin eruption  Elevated INR   The patient's rash appears to have 2 separate qualities to it. The rash around her hyperpigmented skin of the lower extremity is suggestive of a vasculitis. Other main or the rash is present on her thighs her forearms is fairly innocuous in appearance and is more suggestive of a contact dermatitis or allergic reaction. The patient is on Coumadin and her INR is elevated. It is conceivable that the findings on her lower legs are related to her Coumadin. The sedimentation rate is not elevated and not supportive of the diagnosis of an acute vasculitic reaction. Her other labs are within range of her baseline. The patient is advised as a mild increase in her BUN and creatinine which she will follow up with her  family physician. This appears very unlikely to have any relation to today's symptoms. She has no present constitutional symptoms or positives on review of systems. Due to her medical history and being on Coumadin I do not think that prednisone is warned that at this point time for the  rash, it is fairly mild and I feel that the risks of increased bleeding or adverse reaction outweigh the benefit at this point in time. I have suggested some when necessary Benadryl and 25 mg if she needs it for itching. The patient will hold her Coumadin today and follow up with the Coumadin clinic in her family physician within one to 2 days.  I personally performed the services described in this documentation, which was scribed in my presence. The recorded information has been reviewed and is accurate.  Charlesetta Shanks, MD 09/12/14 2010

## 2014-09-12 NOTE — Discharge Instructions (Signed)
Anticoagulation, Generic Anticoagulants are medicines used to prevent clots from developing in your veins. These medicine are also known as blood thinners. If blood clots are untreated, they could travel to your lungs. This is called a pulmonary embolus. A blood clot in your lungs can be fatal.  Health care providers often use anticoagulants to prevent clots following surgery. Anticoagulants are also used along with aspirin when the heart is not getting enough blood. Another anticoagulant called warfarin is started 2 to 3 days after a rapid-acting injectable anticoagulant is started. The rapid-acting anticoagulants are usually continued until warfarin has begun to work. Your health care provider will judge this length of time by blood tests known as the prothrombin time (PT) and International Normalization Ratio (INR). This means that your blood is at the necessary and best level to prevent clots.  DO NOT TAKE TODAY'S DOSE OF YOUR COUMADIN.RECHECK WITH YOUR DOCTOR TOMORROW.Rash A rash is a change in the color or texture of your skin. There are many different types of rashes. You may have other problems that accompany your rash. CAUSES   Infections.  Allergic reactions. This can include allergies to pets or foods.  Certain medicines.  Exposure to certain chemicals, soaps, or cosmetics.  Heat.  Exposure to poisonous plants.  Tumors, both cancerous and noncancerous. SYMPTOMS   Redness.  Scaly skin.  Itchy skin.  Dry or cracked skin.  Bumps.  Blisters.  Pain. DIAGNOSIS  Your caregiver may do a physical exam to determine what type of rash you have. A skin sample (biopsy) may be taken and examined under a microscope. TREATMENT  Treatment depends on the type of rash you have. Your caregiver may prescribe certain medicines. For serious conditions, you may need to see a skin doctor (dermatologist). HOME CARE INSTRUCTIONS   Avoid the substance that caused your rash.  Do not scratch  your rash. This can cause infection.  You may take cool baths to help stop itching.  Only take over-the-counter or prescription medicines as directed by your caregiver.  Keep all follow-up appointments as directed by your caregiver. SEEK IMMEDIATE MEDICAL CARE IF:  You have increasing pain, swelling, or redness.  You have a fever.  You have new or severe symptoms.  You have body aches, diarrhea, or vomiting.  Your rash is not better after 3 days. MAKE SURE YOU:  Understand these instructions.  Will watch your condition.  Will get help right away if you are not doing well or get worse. Document Released: 11/02/2002 Document Revised: 02/04/2012 Document Reviewed: 08/27/2011 [facility] Patient Information 2015 [place], [place]. This information is not intended to replace advice given to you by your health care provider. Make sure you discuss any questions you have with your health care provider.  RISKS AND COMPLICATIONS  If you have received recent epidural anesthesia, spinal anesthesia, or a spinal tap while receiving anticoagulants, you are at risk for developing a blood clot in or around the spine. This condition could result in long-term or permanent paralysis.  Because anticoagulants thin your blood, severe bleeding may occur from any tissue or organ. Symptoms of the blood being too thin may include:  Bleeding from the nose or gums that does not stop quickly.  Blood in bowel movements which may appear as bright red, dark, or black tarry stools.  Blood in the urine which may appear as pink, red, or brown urine.  Unusual bruising or bruising easily.  A cut that does not stop bleeding within 10 minutes.  Vomiting  blood or continuous nausea for more than 1 day.  Coughing up blood.  Broken blood vessels in your eye (subconjunctival hemorrhage).  Abdominal or back pain with or without flank bruising.  Sudden, severe headache.  Sudden weakness or numbness of the face,  arm, or leg, especially on one side of the body.  Sudden confusion.  Trouble speaking (aphasia) or understanding.  Sudden trouble seeing in one or both eyes.  Sudden trouble walking.  Dizziness.  Loss of balance or coordination.  Vaginal bleeding.  Swelling or pain at an injection site.  Superficial fat tissue death (necrosis) which may cause skin scarring. This is more common in women and may first present as pain in the waist, thighs, or buttocks.  Fever.  Too little anticoagulation continues to allow the risk for blood clots. HOME CARE INSTRUCTIONS   Due to the complications of anticoagulants, it is very important that you take your anticoagulant as directed by your health care provider. Anticoagulants need to be taken exactly as instructed. Be sure you understand all your anticoagulant instructions.  Keep all follow-up appointments with your health care provider as directed. It is very important to keep your appointments. Not keeping appointments could result in a chronic or permanent injury, pain, or disability.  Warfarin. Your health care provider will advise you on the length of treatment (usually 3-6 months, sometimes lifelong).  Take warfarin exactly as directed by your health care provider. It is recommended that you take your warfarin dose at the same time of the day. It is preferred that you take warfarin in the late afternoon. If you have been told to stop taking warfarin, do not resume taking warfarin until directed to do so by your health care provider. Follow your health care provider's instructions if you accidentally take an extra dose or miss a dose of warfarin. It is very important to take warfarin as directed since bleeding or blood clots could result in chronic or permanent injury, pain, or disability.  Too much and too little warfarin are both dangerous. Too much warfarin increases the risk of bleeding. Too little warfarin continues to allow the risk for blood  clots. While taking warfarin, you will need to have regular blood tests to measure your blood clotting time. These blood tests usually include both the prothrombin time (PT) and International Normalized Ratio (INR) tests. The PT and INR results allow your health care provider to adjust your dose of warfarin. The dose can change for many reasons. It is critically important that you have your PT and INR levels drawn exactly as directed. Your warfarin dose may stay the same or change depending on what the PT and INR results are. Be sure to follow up with your health care provider regarding your PT and INR test results and what your warfarin dosage should be.  Many medicines can interfere with warfarin and affect the PT and INR results. You must tell your health care provider about any and all medicines you take, this includes all vitamins and supplements. Ask your health care provider before taking these. Prescription and over-the-counter medicine consistency is critical to warfarin management. It is important that potential interactions are checked before you start a new medicine. Be especially cautious with aspirin and anti-inflammatory medicines. Ask your health care provider before taking these. Medicines such as antibiotics and acid-reducing medicine can interact with warfarin and can cause an increased warfarin effect. Warfarin can also interfere with the effectiveness of medicines you are taking. Do not take or  discontinue any prescribed or over-the-counter medicine except on the advice of your health care provider or pharmacist.  Some vitamins, supplements, and herbal products interfere with the effectiveness of warfarin. Vitamin E may increase the anticoagulant effects of warfarin. Vitamin K may can cause warfarin to be less effective. Do not take or discontinue any vitamin, supplement, or herbal product except on the advice of your health care provider or pharmacist.  Eat what you normally eat and keep  the vitamin K content of your diet consistent. Avoid major changes in your diet, or notify your health care provider before changing your diet. Suddenly getting a lot more vitamin K could cause your blood to clot too quickly. A sudden decrease in vitamin K intake could cause your blood to clot too slowly. These changes in vitamin K intake could lead to dangerous blood clotsor to bleeding. To keep your vitamin K intake consistent, you must be aware of which foods contain moderate or high amounts of vitamin K. Some foods high in vitamin K include spinach, kale, broccoli, cabbage, greens, Brussels sprouts, asparagus, [place]ok [place]oy, coleslaw, parsley, and green tea. Arrange a visit with a dietitian to answer your questions.  If you have a loss of appetite or get the stomach flu (viral gastroenteritis), talk to your health care provider as soon as possible. A decrease in your normal vitamin K intake can make you more sensitive to your usual dose of warfarin.  Some medical conditions may increase your risk for bleeding while you are taking warfarin. A fever, diarrhea lasting more than a day, worsening heart failure, or worsening liver function are some medical conditions that could affect warfarin. Contact your health care provider if you have any of these medical conditions.  Alcohol can change the body's ability to handle warfarin. It is best to avoid alcoholic drinks or consume only very small amounts while taking warfarin. Notify your health care provider if you change your alcohol intake. A sudden increase in alcohol use can increase your risk of bleeding. Chronic alcohol use can cause warfarin to be less effective.  Be careful not to cut yourself when using sharp objects or while shaving.  Inform all your health care providers and your dentist that you take an anticoagulant.  Limit physical activities or sports that could result in a fall or cause injury. Avoid contact sports.  Wear medical alert jewelry  or carry a medical alert card. SEEK IMMEDIATE MEDICAL CARE IF:  You cough up blood.  You have dark or black stools or there is bright red blood coming from your rectum.  You vomit blood or have nausea for more than 1 day.  You have blood in the urine or pink colored urine.  You have unusual bruising or have increased bruising.  You have bleeding from the nose or gums that does not stop quickly.  You have a cut that does not stop bleeding within a 2-3 minutes.  You have sudden weakness or numbness of the face, arm, or leg, especially on one side of the body.  You have sudden confusion.  You have trouble speaking (aphasia) or understanding.  You have sudden trouble seeing in one or both eyes.  You have sudden trouble walking.  You have dizziness.  You have a loss of balance or coordination.  You have a sudden, severe headache.  You have a serious fall or head injury, even if you are not bleeding.  You have swelling or pain at an injection site.  You have  unexplained tenderness or pain in the abdomen, back, waist, thighs or buttocks.  You have a fever. Any of these symptoms may represent a serious problem that is an emergency. Do not wait to see if the symptoms will go away. Get medical help right away. Call your local emergency services (911 in [place] Do not drive yourself to the hospital. Document Released: 11/12/2005 Document Revised: 11/17/2013 Document Reviewed: 06/16/2008 [facility] Patient Information 2015 [place], [place]. This information is not intended to replace advice given to you by your health care provider. Make sure you discuss any questions you have with your health care provider.

## 2014-09-13 ENCOUNTER — Ambulatory Visit (INDEPENDENT_AMBULATORY_CARE_PROVIDER_SITE_OTHER): Payer: Medicare Other | Admitting: Cardiology

## 2014-09-13 ENCOUNTER — Telehealth: Payer: Self-pay | Admitting: Cardiovascular Disease

## 2014-09-13 DIAGNOSIS — I4891 Unspecified atrial fibrillation: Secondary | ICD-10-CM

## 2014-09-13 DIAGNOSIS — Z7901 Long term (current) use of anticoagulants: Secondary | ICD-10-CM

## 2014-09-13 NOTE — Telephone Encounter (Signed)
New Message  Pt called states that she was seen in the hospital over the weekend and her Pro-time level was 4.07. Request a same day appt with coumadin. Please call back to discuss

## 2014-09-13 NOTE — Telephone Encounter (Signed)
Went to [facility] yesterday for derm issues.  INR 4.07, held dosage of Coumadin last pm.  Advised pt to start taking 1 tablet daily and keep scheduled f/u on 09/20/14 for recheck.  Pt verbalized understanding.

## 2014-09-14 ENCOUNTER — Ambulatory Visit (INDEPENDENT_AMBULATORY_CARE_PROVIDER_SITE_OTHER): Payer: Medicare Other | Admitting: Family Medicine

## 2014-09-14 ENCOUNTER — Encounter: Payer: Self-pay | Admitting: Family Medicine

## 2014-09-14 ENCOUNTER — Other Ambulatory Visit: Payer: Self-pay | Admitting: Cardiovascular Disease

## 2014-09-14 VITALS — BP 130/83 | HR 74 | Temp 98.4°F | Ht 70.5 in | Wt 194.0 lb

## 2014-09-14 DIAGNOSIS — Z7901 Long term (current) use of anticoagulants: Secondary | ICD-10-CM

## 2014-09-14 DIAGNOSIS — R21 Rash and other nonspecific skin eruption: Secondary | ICD-10-CM

## 2014-09-14 LAB — POCT INR: INR: 3.3

## 2014-09-14 MED ORDER — PREDNISONE 10 MG PO TABS: ORAL_TABLET | ORAL | Status: DC

## 2014-09-14 MED ORDER — METHYLPREDNISOLONE ACETATE 40 MG/ML IJ SUSP
120.0000 mg | Freq: Once | INTRAMUSCULAR | Status: AC
  Administered 2014-09-14: 120 mg via INTRAMUSCULAR

## 2014-09-14 NOTE — Progress Notes (Signed)
   Subjective:    Patient ID: [name], female    DOB: 23-Apr-1929, 80 y.o.   MRN: [id number]  HPI Here to follow up an ER visit on 09-12-14 for a red itchy burning rash on both arms and both legs. This started about 10 days ago and it has slowly spread since then. No SOB. No swelling in the hands or feet. No new medications recently. At the ER it was felt that the rash may represent a type of vasculitis based on its appearance. Her labs were unremarkable that day including an ESR of 15. The doctor wanted to start her on some steroids that day but did not because her INR was over 4. Instead she was told to take Benadryl and to see [place]. There has been no real change since then. Of note her ESR has been elevated on many occasions including a peak of 63 about 7 years ago. She also relates a similar episode about 43 year ago when at the age of 78 she developed a widespread rash and her hands and feet swelled. She was hospitalized at [facility] for 3 weeks then. She had numerous tests done, and she remembers that one test came back positive for rheumatoid arthritis and one was positive for lupus. However other tests were negative and no definitive diagnosis was ever made. She eventually got better and never had a problem like this until now.    Review of Systems  Constitutional: Negative.   Respiratory: Negative.   Cardiovascular: Negative.   Gastrointestinal: Negative.   Musculoskeletal: Positive for arthralgias. Negative for joint swelling.  Skin: Positive for rash.       Objective:   Physical Exam  Constitutional: She is oriented to person, place, and time. She appears well-developed and well-nourished. No distress.  Cardiovascular: Normal rate, regular rhythm, normal heart sounds and intact distal pulses.   Pulmonary/Chest: Effort normal and breath sounds normal.  Abdominal: Soft. Bowel sounds are normal. She exhibits no distension and no mass. There is no tenderness. There  is no rebound and no guarding.  Neurological: She is alert and oriented to person, place, and time.  Skin:  She has a widespread macular erythematous rash over the arms and legs which has a well demarcated irregular bright red border, not tender          Assessment & Plan:  This appears to represent a vasculitis of some sort, and it is likely a recurrence of what she had 40 years ago. Her INR is 3.3 today so it is safe to use steroids. She is given a DepoMedrol shot today, and this will be followed by a 16 day taper of prednisone beginning tomorrow. She can add Benadryl prn. If this is not better in one week, we may refer her to Rheumatology for further evaluation

## 2014-09-14 NOTE — Progress Notes (Signed)
Pre visit review using our clinic review tool, if applicable. No additional management support is needed unless otherwise documented below in the visit note. 

## 2014-09-15 ENCOUNTER — Telehealth: Payer: Self-pay | Admitting: *Deleted

## 2014-09-15 ENCOUNTER — Telehealth (HOSPITAL_COMMUNITY): Payer: Self-pay

## 2014-09-15 LAB — URINE CULTURE

## 2014-09-15 NOTE — ED Notes (Signed)
Post ED Visit - Positive Culture Follow-up: Successful Patient Follow-Up  Culture assessed and recommendations reviewed by: [x]  Wes [place], Pharm.D., BCPS []  Heide Guile, Pharm.D., BCPS []  Alycia Rossetti, Pharm.D., BCPS []  [place], Pharm.D., BCPS, AAHIVP []  Legrand Como, Pharm.D., BCPS, AAHIVP []  Hassie Bruce, Pharm.D. []  Milus Glazier, Pharm.D.  Positive urine culture  [x]  Patient discharged without antimicrobial prescription and treatment is now indicated []  Organism is resistant to prescribed ED discharge antimicrobial []  Patient with positive blood cultures  Changes discussed with ED provider: lauren Jerline Pain PA New antibiotic prescription keflex 500 mg BID x 7 days Called to unk  Contacted patient, date 09/15/2014, time 1306   Ileene Musa 09/15/2014, 1:05 PM

## 2014-09-15 NOTE — Progress Notes (Signed)
ED Antimicrobial Stewardship Positive Culture Follow Up   [name] is an 80 y.o. female who presented to [facility] on 09/12/2014 with a chief complaint of  Chief Complaint  Patient presents with  . Rash    Recent Results (from the past 720 hour(s))  URINE CULTURE     Status: None   Collection Time    09/12/14  6:10 PM      Result Value Ref Range Status   Specimen Description URINE, CLEAN CATCH   Final   Special Requests NONE   Final   Culture  Setup Time     Final   Value: 09/13/2014 03:14     Performed at [facility]     Final   Value: >=100,000 COLONIES/ML     Performed at [facility]   Culture     Final   Value: KLEBSIELLA PNEUMONIAE     Performed at [facility]   Report Status 09/15/2014 FINAL   Final   Organism ID, Bacteria KLEBSIELLA PNEUMONIAE   Final    []  Treated with , organism resistant to prescribed antimicrobial [x]  Patient discharged originally without antimicrobial agent and treatment is now indicated - CrCl ~45 ml/min  Plan: Discussed with provider - PA Jerline Pain wishes to treat possible Klebsiella pneumoniae UTI. Start Cephalexin 500mg  PO BID x 7 days.  ED Provider: Harvie Heck, PA-C   Earleen Newport 09/15/2014, 10:12 AM Infectious Diseases Pharmacist Phone# [phone]

## 2014-09-15 NOTE — Telephone Encounter (Signed)
Received call from pt that she has seen her MD Dr Sharlene Motts yesterday and received Steroid injection and placed on oral Prednisone 10mg  tabs 4 for 4 days tabs 3 for 4 days tabs 2 for 4 days and tab 1 for 4 days and she is going to start Prednisone today. Pt  was instructed to hold coumadin yesterday as her INR  was 3.3 on the 20th so instructed to continue 5mg  daily and made appt for her to be seen on Friday to have INR rechecked . Pt verbalized understanding.

## 2014-09-16 ENCOUNTER — Telehealth (HOSPITAL_BASED_OUTPATIENT_CLINIC_OR_DEPARTMENT_OTHER): Payer: Self-pay | Admitting: Emergency Medicine

## 2014-09-16 NOTE — Telephone Encounter (Incomplete)
Post ED Visit - Positive Culture Follow-up: Successful Patient Follow-Up  Culture assessed and recommendations reviewed by: [x]  Wes [place], Pharm.D., BCPS []  Heide Guile, Pharm.D., BCPS []  Alycia Rossetti, Pharm.D., BCPS []  [place], Pharm.D., BCPS, AAHIVP []  Legrand Como, Pharm.D., BCPS, AAHIVP []  Hassie Bruce, Pharm.D. []  Milus Glazier, Pharm.D.  Positive urine culture  [x]  Patient discharged without antimicrobial prescription and treatment is now indicated []  Organism is resistant to prescribed ED discharge antimicrobial []  Patient with positive blood cultures  Changes discussed with ED provider: Jerline Pain PA New antibiotic prescription Keflex 500mg  po bid x 7 days Called to [place]  Contacted patient, date 09/16/2014, time 1159   Hazle Nordmann 09/16/2014, 12:10 PM

## 2014-09-17 ENCOUNTER — Ambulatory Visit (INDEPENDENT_AMBULATORY_CARE_PROVIDER_SITE_OTHER): Payer: Medicare Other | Admitting: *Deleted

## 2014-09-17 DIAGNOSIS — Z7901 Long term (current) use of anticoagulants: Secondary | ICD-10-CM | POA: Diagnosis not present

## 2014-09-17 DIAGNOSIS — I4891 Unspecified atrial fibrillation: Secondary | ICD-10-CM | POA: Diagnosis not present

## 2014-09-17 LAB — POCT INR: INR: 2

## 2014-09-21 ENCOUNTER — Other Ambulatory Visit: Payer: Self-pay

## 2014-09-21 MED ORDER — SPIRONOLACTONE 25 MG PO TABS: 25.0000 mg | ORAL_TABLET | Freq: Every day | ORAL | Status: DC

## 2014-09-22 ENCOUNTER — Ambulatory Visit (INDEPENDENT_AMBULATORY_CARE_PROVIDER_SITE_OTHER): Payer: Medicare Other | Admitting: *Deleted

## 2014-09-22 DIAGNOSIS — I4891 Unspecified atrial fibrillation: Secondary | ICD-10-CM | POA: Diagnosis not present

## 2014-09-22 DIAGNOSIS — Z7901 Long term (current) use of anticoagulants: Secondary | ICD-10-CM | POA: Diagnosis not present

## 2014-09-22 LAB — POCT INR: INR: 3.1

## 2014-09-23 ENCOUNTER — Telehealth: Payer: Self-pay | Admitting: Family Medicine

## 2014-09-23 NOTE — Telephone Encounter (Signed)
Pt called to say that she is having side effects from taking predisone, severe  leg and feet cramping   Pt is asking if she should continue taking the predisone she said the rash has improved

## 2014-09-23 NOTE — Telephone Encounter (Signed)
Yes just stop the prednisone now. Recheck prn

## 2014-09-23 NOTE — Telephone Encounter (Signed)
I left a voice message with the below information.

## 2014-09-24 NOTE — Telephone Encounter (Signed)
I spoke with pt  

## 2014-09-25 ENCOUNTER — Other Ambulatory Visit: Payer: Self-pay | Admitting: Cardiovascular Disease

## 2014-09-29 ENCOUNTER — Ambulatory Visit (INDEPENDENT_AMBULATORY_CARE_PROVIDER_SITE_OTHER): Payer: Medicare Other | Admitting: *Deleted

## 2014-09-29 DIAGNOSIS — I4891 Unspecified atrial fibrillation: Secondary | ICD-10-CM | POA: Diagnosis not present

## 2014-09-29 DIAGNOSIS — Z7901 Long term (current) use of anticoagulants: Secondary | ICD-10-CM | POA: Diagnosis not present

## 2014-09-29 LAB — POCT INR: INR: 4.2

## 2014-10-04 NOTE — Progress Notes (Signed)
[name] is seen today for f/U of afib, HTN, and edema. Her coumadin has been Rx. No palpitations, SOB or SSCP. She is walking on a regular basis without symptoms. She retired from [employer] 2 years ago . Her husband [name] died and she is now living in apartment near [place] has worked well as a diuretic for edema as she requires much less K. 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 Started back doing water aerobics at the [facility]   I care for her sister [name] who has had issues with edema and memory   Echo 09/29/13 with mild AS Study Conclusions  - Left ventricle: The cavity size was normal. Wall thickness was increased in a pattern of mild LVH. Systolic function was normal. The estimated ejection fraction was in the range of 55% to 60%. - Aortic valve: There was mild stenosis. 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 - Left atrium: The appendage was severely dilated. - Right atrium: The atrium was mildly dilated. - Atrial septum: No defect or patent foramen ovale was identified. - Tricuspid valve: Moderate regurgitation.   She is working 20 hrs/week at [employer]  Had right retinal detachment a month ago Rx by Dr Zadie Rhine    ROS: Denies fever, malais, weight loss, blurry vision, decreased visual acuity, cough, sputum, SOB, hemoptysis, pleuritic pain, palpitaitons, heartburn, abdominal pain, melena, lower extremity edema, claudication, or rash.  All other systems reviewed and negative  General: Affect appropriate Healthy:  appears stated age 80: normal Neck supple with no adenopathy JVP normal no bruits no thyromegaly Lungs clear with no wheezing and good diaphragmatic motion Heart:  S1/S2 AS  murmur, no rub, gallop or click PMI normal Abdomen: benighn, BS positve, no  tenderness, no AAA no bruit.  No HSM or HJR Distal pulses intact with no bruits No edema Neuro non-focal Skin warm and dry No muscular weakness   Current Outpatient Prescriptions  Medication Sig Dispense Refill  . ALPRAZolam (XANAX) 0.25 MG tablet Take 1 tablet (0.25 mg total) by mouth at bedtime as needed for sleep. 30 tablet 5  . atorvastatin (LIPITOR) 20 MG tablet TAKE 1 TABLET DAILY 90 tablet 3  . Calcium Carbonate-Vitamin D (CALTRATE 600+D) 600-400 MG-UNIT per tablet Take 1 tablet by mouth daily.      . carvedilol (COREG) 6.25 MG tablet TAKE 1 TABLET TWICE A DAY 180 tablet 0  . diphenhydrAMINE (BENADRYL) 25 MG tablet Take 1 tablet (25 mg total) by mouth every 4 (four) hours as needed for itching. 20 tablet 0  . estradiol (CLIMARA - DOSED IN MG/24 HR) 0.1 mg/24hr Place 1 patch onto the skin once a week.      . furosemide (LASIX) 40 MG tablet TAKE 1 TABLET DAILY 90 tablet 2  . levothyroxine (SYNTHROID, LEVOTHROID) 125 MCG tablet Take 1 tablet (125 mcg total) by mouth daily. 90 tablet 4  . meclizine (ANTIVERT) 25 MG tablet TAKE 1/2 OR 1 TABLET BY MOUTH EVERY 6 HOURS AS NEEDED FOR DIZZINESS 30 tablet 5  . meloxicam (MOBIC) 7.5 MG tablet TAKE 1 TABLET DAILY 90 tablet 0  . Multiple Vitamins-Minerals (CENTRUM SILVER PO) Take 1 tablet by mouth daily.      . potassium chloride SA (K-DUR,KLOR-CON) 20 MEQ tablet Take 1 tablet (20 mEq  total) by mouth 2 (two) times daily. 180 tablet 1  . predniSONE (DELTASONE) 10 MG tablet Take 4 tabs a day for 4 days, then 3 a day for 4 days, then 2 a day for 4 days, then 1 a day for 4 days, then stop 60 tablet 0  . quinapril (ACCUPRIL) 20 MG tablet TAKE 1 TABLET DAILY 90 tablet 0  . spironolactone (ALDACTONE) 25 MG tablet Take 1 tablet (25 mg total) by mouth daily. 90 tablet 3  . vitamin B-12 (CYANOCOBALAMIN) 1000 MCG tablet Take 1,000 mcg by mouth daily.      Marland Kitchen warfarin (COUMADIN) 5 MG tablet TAKE AS DIRECTED BY ANTICOAGULATION CLINIC 120 tablet 1   No  current facility-administered medications for this visit.    Allergies  Codeine  Electrocardiogram:  4/14  afib rate 64  Nonspecific ST/T wave changes  Today afib rate 63 nonspecific St changes similar to 2014  Assessment and Plan

## 2014-10-05 ENCOUNTER — Ambulatory Visit (INDEPENDENT_AMBULATORY_CARE_PROVIDER_SITE_OTHER): Payer: Medicare Other | Admitting: Cardiovascular Disease

## 2014-10-05 ENCOUNTER — Ambulatory Visit (INDEPENDENT_AMBULATORY_CARE_PROVIDER_SITE_OTHER): Payer: Medicare Other | Admitting: *Deleted

## 2014-10-05 ENCOUNTER — Ambulatory Visit (HOSPITAL_COMMUNITY): Payer: Medicare Other | Attending: Cardiology

## 2014-10-05 ENCOUNTER — Encounter: Payer: Self-pay | Admitting: Cardiovascular Disease

## 2014-10-05 VITALS — BP 122/78 | HR 63 | Ht 71.0 in | Wt 184.8 lb

## 2014-10-05 DIAGNOSIS — I4891 Unspecified atrial fibrillation: Secondary | ICD-10-CM

## 2014-10-05 DIAGNOSIS — E785 Hyperlipidemia, unspecified: Secondary | ICD-10-CM | POA: Diagnosis not present

## 2014-10-05 DIAGNOSIS — I350 Nonrheumatic aortic (valve) stenosis: Secondary | ICD-10-CM

## 2014-10-05 DIAGNOSIS — I482 Chronic atrial fibrillation, unspecified: Secondary | ICD-10-CM

## 2014-10-05 DIAGNOSIS — I10 Essential (primary) hypertension: Secondary | ICD-10-CM | POA: Insufficient documentation

## 2014-10-05 DIAGNOSIS — I340 Nonrheumatic mitral (valve) insufficiency: Secondary | ICD-10-CM | POA: Diagnosis not present

## 2014-10-05 DIAGNOSIS — Z7901 Long term (current) use of anticoagulants: Secondary | ICD-10-CM | POA: Diagnosis not present

## 2014-10-05 DIAGNOSIS — I872 Venous insufficiency (chronic) (peripheral): Secondary | ICD-10-CM | POA: Diagnosis not present

## 2014-10-05 LAB — POCT INR: INR: 2.1

## 2014-10-05 NOTE — Assessment & Plan Note (Signed)
Reviewed her echo from today and AS remains in mild range  No change in murmur

## 2014-10-05 NOTE — Assessment & Plan Note (Signed)
Well controlled.  Continue current medications and low sodium Dash type diet.    

## 2014-10-05 NOTE — Patient Instructions (Signed)
Your physician wants you to follow-up in:  6 MONTHS WITH DR NISHAN  You will receive a reminder letter in the mail two months in advance. If you don't receive a letter, please call our office to schedule the follow-up appointment. Your physician recommends that you continue on your current medications as directed. Please refer to the Current Medication list given to you today. 

## 2014-10-05 NOTE — Assessment & Plan Note (Signed)
Reviewed echo form today MAC with mild functional MS and moderate MR no change from echo compared 2014  STable

## 2014-10-05 NOTE — Assessment & Plan Note (Signed)
Good rate control and anticoagulation with no bleeding issues

## 2014-10-05 NOTE — Progress Notes (Signed)
2D Echo completed. 10/05/2014

## 2014-10-05 NOTE — Assessment & Plan Note (Signed)
Continue diuretic with low sodium diet

## 2014-10-19 ENCOUNTER — Other Ambulatory Visit: Payer: Self-pay | Admitting: Cardiovascular Disease

## 2014-10-19 ENCOUNTER — Ambulatory Visit (INDEPENDENT_AMBULATORY_CARE_PROVIDER_SITE_OTHER): Payer: Medicare Other | Admitting: Pharmacist

## 2014-10-19 DIAGNOSIS — I4891 Unspecified atrial fibrillation: Secondary | ICD-10-CM | POA: Diagnosis not present

## 2014-10-19 DIAGNOSIS — Z7901 Long term (current) use of anticoagulants: Secondary | ICD-10-CM | POA: Diagnosis not present

## 2014-10-19 LAB — POCT INR: INR: 2.1

## 2014-10-22 ENCOUNTER — Other Ambulatory Visit: Payer: Self-pay | Admitting: Cardiovascular Disease

## 2014-10-24 ENCOUNTER — Other Ambulatory Visit: Payer: Self-pay | Admitting: Family Medicine

## 2014-10-28 ENCOUNTER — Other Ambulatory Visit: Payer: Self-pay | Admitting: Cardiovascular Disease

## 2014-11-08 ENCOUNTER — Ambulatory Visit (INDEPENDENT_AMBULATORY_CARE_PROVIDER_SITE_OTHER): Payer: Medicare Other | Admitting: *Deleted

## 2014-11-08 DIAGNOSIS — I4891 Unspecified atrial fibrillation: Secondary | ICD-10-CM

## 2014-11-08 DIAGNOSIS — Z7901 Long term (current) use of anticoagulants: Secondary | ICD-10-CM | POA: Diagnosis not present

## 2014-11-08 LAB — POCT INR: INR: 2.3

## 2014-12-03 ENCOUNTER — Other Ambulatory Visit: Payer: Self-pay | Admitting: *Deleted

## 2014-12-03 MED ORDER — CARVEDILOL 6.25 MG PO TABS: 6.2500 mg | ORAL_TABLET | Freq: Two times a day (BID) | ORAL | Status: DC

## 2014-12-06 ENCOUNTER — Ambulatory Visit (INDEPENDENT_AMBULATORY_CARE_PROVIDER_SITE_OTHER): Payer: Medicare Other | Admitting: Pharmacist

## 2014-12-06 DIAGNOSIS — I4891 Unspecified atrial fibrillation: Secondary | ICD-10-CM | POA: Diagnosis not present

## 2014-12-06 DIAGNOSIS — Z7901 Long term (current) use of anticoagulants: Secondary | ICD-10-CM

## 2014-12-06 LAB — POCT INR: INR: 2

## 2014-12-13 ENCOUNTER — Other Ambulatory Visit: Payer: Self-pay | Admitting: Cardiovascular Disease

## 2014-12-21 DIAGNOSIS — H35371 Puckering of macula, right eye: Secondary | ICD-10-CM | POA: Diagnosis not present

## 2014-12-21 DIAGNOSIS — H35351 Cystoid macular degeneration, right eye: Secondary | ICD-10-CM | POA: Diagnosis not present

## 2015-01-18 ENCOUNTER — Ambulatory Visit (INDEPENDENT_AMBULATORY_CARE_PROVIDER_SITE_OTHER): Payer: Medicare Other | Admitting: *Deleted

## 2015-01-18 DIAGNOSIS — I4891 Unspecified atrial fibrillation: Secondary | ICD-10-CM | POA: Diagnosis not present

## 2015-01-18 DIAGNOSIS — Z7901 Long term (current) use of anticoagulants: Secondary | ICD-10-CM | POA: Diagnosis not present

## 2015-01-18 LAB — POCT INR: INR: 2.4

## 2015-01-20 DIAGNOSIS — H35371 Puckering of macula, right eye: Secondary | ICD-10-CM | POA: Diagnosis not present

## 2015-01-20 DIAGNOSIS — H35351 Cystoid macular degeneration, right eye: Secondary | ICD-10-CM | POA: Diagnosis not present

## 2015-01-31 ENCOUNTER — Telehealth: Payer: Self-pay | Admitting: *Deleted

## 2015-01-31 NOTE — Telephone Encounter (Signed)
Patient requests a refill on synthroid. Ok to refill? Please advise. Thanks, MI

## 2015-02-01 MED ORDER — LEVOTHYROXINE SODIUM 125 MCG PO TABS: 125.0000 ug | ORAL_TABLET | Freq: Every day | ORAL | Status: DC

## 2015-02-01 NOTE — Telephone Encounter (Signed)
Follow up     Pt returning nurse  call from today.

## 2015-02-01 NOTE — Telephone Encounter (Signed)
Follow UP  Pt returning Christine's phone call. Please call back and discuss.

## 2015-02-01 NOTE — Telephone Encounter (Signed)
LM TO CALL BACK ./CY 

## 2015-02-01 NOTE — Telephone Encounter (Signed)
PER PT   DR Johnsie Cancel FILLS  SYNTHROID  SCRIPT  SENT  VIA  EPIC  TO  [facility] AT  PT'S  REQUEST  .Adonis Housekeeper

## 2015-03-01 ENCOUNTER — Ambulatory Visit (INDEPENDENT_AMBULATORY_CARE_PROVIDER_SITE_OTHER): Payer: Medicare Other

## 2015-03-01 DIAGNOSIS — I4891 Unspecified atrial fibrillation: Secondary | ICD-10-CM

## 2015-03-01 DIAGNOSIS — Z7901 Long term (current) use of anticoagulants: Secondary | ICD-10-CM

## 2015-03-01 LAB — POCT INR: INR: 2.3

## 2015-03-11 ENCOUNTER — Ambulatory Visit (INDEPENDENT_AMBULATORY_CARE_PROVIDER_SITE_OTHER): Payer: Medicare Other | Admitting: Family Medicine

## 2015-03-11 ENCOUNTER — Encounter: Payer: Self-pay | Admitting: Family Medicine

## 2015-03-11 VITALS — BP 128/81 | HR 73 | Temp 97.7°F | Ht 71.0 in | Wt 187.0 lb

## 2015-03-11 DIAGNOSIS — I776 Arteritis, unspecified: Secondary | ICD-10-CM

## 2015-03-11 DIAGNOSIS — E785 Hyperlipidemia, unspecified: Secondary | ICD-10-CM | POA: Diagnosis not present

## 2015-03-11 LAB — LIPID PANEL
Cholesterol: 139 mg/dL (ref 0–200)
HDL: 41.1 mg/dL (ref >39.00)
LDL Cholesterol: 69 mg/dL (ref 0–99)
NonHDL: 97.9
Total CHOL/HDL Ratio: 3
Triglycerides: 143 mg/dL (ref 0.0–149.0)
VLDL: 28.6 mg/dL (ref 0.0–40.0)

## 2015-03-11 LAB — BASIC METABOLIC PANEL
BUN: 22 mg/dL (ref 6–23)
CO2: 28 mEq/L (ref 19–32)
Calcium: 9.8 mg/dL (ref 8.4–10.5)
Chloride: 105 mEq/L (ref 96–112)
Creatinine, Ser: 0.84 mg/dL (ref 0.40–1.20)
GFR: 68.66 mL/min (ref >60.00)
Glucose, Bld: 108 mg/dL — ABNORMAL HIGH (ref 70–99)
Potassium: 5.3 mEq/L — ABNORMAL HIGH (ref 3.5–5.1)
Sodium: 137 mEq/L (ref 135–145)

## 2015-03-11 LAB — CBC WITH DIFFERENTIAL/PLATELET
BASOS PCT: 0.7 % (ref 0.0–3.0)
Basophils Absolute: 0.1 10*3/uL (ref 0.0–0.1)
Eosinophils Absolute: 0.2 10*3/uL (ref 0.0–0.7)
Eosinophils Relative: 3.3 % (ref 0.0–5.0)
HEMATOCRIT: 40.3 % (ref 36.0–46.0)
HEMOGLOBIN: 13.5 g/dL (ref 12.0–15.0)
Lymphocytes Relative: 20.5 % (ref 12.0–46.0)
Lymphs Abs: 1.5 10*3/uL (ref 0.7–4.0)
MCHC: 33.6 g/dL (ref 30.0–36.0)
MCV: 93.3 fl (ref 78.0–100.0)
Monocytes Absolute: 0.6 10*3/uL (ref 0.1–1.0)
Monocytes Relative: 8.9 % (ref 3.0–12.0)
NEUTROS ABS: 4.9 10*3/uL (ref 1.4–7.7)
Neutrophils Relative %: 66.6 % (ref 43.0–77.0)
Platelets: 253 10*3/uL (ref 150.0–400.0)
RBC: 4.32 Mil/uL (ref 3.87–5.11)
RDW: 13.4 % (ref 11.5–15.5)
WBC: 7.3 10*3/uL (ref 4.0–10.5)

## 2015-03-11 LAB — HEPATIC FUNCTION PANEL
ALT: 15 U/L (ref 0–35)
AST: 18 U/L (ref 0–37)
Albumin: 4.2 g/dL (ref 3.5–5.2)
Alkaline Phosphatase: 71 U/L (ref 39–117)
Bilirubin, Direct: 0.2 mg/dL (ref 0.0–0.3)
Total Bilirubin: 0.9 mg/dL (ref 0.2–1.2)
Total Protein: 7.3 g/dL (ref 6.0–8.3)

## 2015-03-11 LAB — POCT URINALYSIS DIPSTICK
Bilirubin, UA: NEGATIVE
Glucose, UA: NEGATIVE
KETONES UA: NEGATIVE
Nitrite, UA: POSITIVE
PH UA: 6.5
PROTEIN UA: NEGATIVE
Spec Grav, UA: 1.015
Urobilinogen, UA: 0.2

## 2015-03-11 LAB — TSH: TSH: 0.65 u[IU]/mL (ref 0.35–4.50)

## 2015-03-11 LAB — URIC ACID: Uric Acid, Serum: 6.8 mg/dL (ref 2.4–7.0)

## 2015-03-11 MED ORDER — METHYLPREDNISOLONE ACETATE 80 MG/ML IJ SUSP
120.0000 mg | Freq: Once | INTRAMUSCULAR | Status: AC
  Administered 2015-03-11: 120 mg via INTRAMUSCULAR

## 2015-03-11 MED ORDER — PREDNISONE 10 MG PO TABS: ORAL_TABLET | ORAL | Status: DC

## 2015-03-11 NOTE — Progress Notes (Signed)
   Subjective:    Patient ID: [name], female    DOB: 1929-05-10, 81 y.o.   MRN: [id number]  HPI Here for 5 days of redness and swelling in the right forefoot. No recent trauma. There is no pain but of course she has extensive numbness anyway from her neuropathy. She has a hx of recurrent vasculitis in various forms, which is possibly related to lupus. A definitive link has never been established. No SOB or other sx.    Review of Systems  Constitutional: Negative.   Respiratory: Negative.   Cardiovascular: Positive for leg swelling. Negative for chest pain and palpitations.  Gastrointestinal: Negative.   Neurological: Negative.   Hematological: Negative.        Objective:   Physical Exam  Constitutional: She is oriented to person, place, and time. She appears well-developed and well-nourished. No distress.  Cardiovascular: Normal rate, regular rhythm, normal heart sounds and intact distal pulses.   Pulmonary/Chest: Effort normal and breath sounds normal.  Musculoskeletal:  Right forefoot is swollen and red, not tender and not warm  Neurological: She is alert and oriented to person, place, and time.          Assessment & Plan:  This is probably another bout of her autoimmune vasculitis though gout is a remote possibility. Given a steroid shot and a taper of prednisone. Get labs including a uric acid.

## 2015-03-11 NOTE — Addendum Note (Signed)
Addended by: Aggie Hacker A on: 03/11/2015 10:41 AM   Modules accepted: Orders

## 2015-03-11 NOTE — Progress Notes (Signed)
Pre visit review using our clinic review tool, if applicable. No additional management support is needed unless otherwise documented below in the visit note. 

## 2015-03-13 ENCOUNTER — Other Ambulatory Visit: Payer: Self-pay | Admitting: Cardiovascular Disease

## 2015-03-14 ENCOUNTER — Telehealth: Payer: Self-pay | Admitting: *Deleted

## 2015-03-14 ENCOUNTER — Telehealth: Payer: Self-pay

## 2015-03-14 NOTE — Telephone Encounter (Signed)
Pt has appt on May 9th 2016 and her PCP started her on Prednisone Friday (03/11/2015) and she would like a sooner coumadin appt. Her scheduled coumadin appt is May 9th 2016 and she would like to come in April 21st 2016

## 2015-03-14 NOTE — Telephone Encounter (Signed)
Patient called to state that she is on Prednione 10mg , her dose instructions are 4 tablets for 4days, 3 tablets for 4 days, 2 tablets for 4 days, 1 tablet for 4 days. Advised that prednisone can cause an increase in INR. Appt made for 03/15/15 for INR recheck. She verbalized understanding.

## 2015-03-15 ENCOUNTER — Ambulatory Visit (INDEPENDENT_AMBULATORY_CARE_PROVIDER_SITE_OTHER): Payer: Medicare Other | Admitting: *Deleted

## 2015-03-15 DIAGNOSIS — I482 Chronic atrial fibrillation, unspecified: Secondary | ICD-10-CM

## 2015-03-15 DIAGNOSIS — I4891 Unspecified atrial fibrillation: Secondary | ICD-10-CM | POA: Diagnosis not present

## 2015-03-15 DIAGNOSIS — Z7901 Long term (current) use of anticoagulants: Secondary | ICD-10-CM | POA: Diagnosis not present

## 2015-03-15 LAB — POCT INR: INR: 2.5

## 2015-03-17 DIAGNOSIS — H35351 Cystoid macular degeneration, right eye: Secondary | ICD-10-CM | POA: Diagnosis not present

## 2015-03-17 DIAGNOSIS — H35371 Puckering of macula, right eye: Secondary | ICD-10-CM | POA: Diagnosis not present

## 2015-03-23 ENCOUNTER — Ambulatory Visit (INDEPENDENT_AMBULATORY_CARE_PROVIDER_SITE_OTHER): Payer: Medicare Other | Admitting: Family Medicine

## 2015-03-23 ENCOUNTER — Encounter: Payer: Self-pay | Admitting: Family Medicine

## 2015-03-23 VITALS — BP 135/78 | HR 110 | Temp 98.5°F | Ht 71.0 in | Wt 185.0 lb

## 2015-03-23 DIAGNOSIS — M2042 Other hammer toe(s) (acquired), left foot: Secondary | ICD-10-CM | POA: Diagnosis not present

## 2015-03-23 DIAGNOSIS — L8990 Pressure ulcer of unspecified site, unspecified stage: Secondary | ICD-10-CM

## 2015-03-23 MED ORDER — CEPHALEXIN 500 MG PO CAPS: 500.0000 mg | ORAL_CAPSULE | Freq: Three times a day (TID) | ORAL | Status: DC

## 2015-03-23 NOTE — Progress Notes (Signed)
Pre visit review using our clinic review tool, if applicable. No additional management support is needed unless otherwise documented below in the visit note. 

## 2015-03-23 NOTE — Progress Notes (Signed)
   Subjective:    Patient ID: [name], female    DOB: [birth year], 81 y.o.   MRN: [id number]  HPI Here to check the 2nd toe on the left foot. She was here recently for swelling and redness in this foot which we presumed was due to an ongoing vasculitis and she was given prednisone. The foot responded well and the swelling went down. However this morning she noticed bloody fluid draining form the toe. It is not painful, but due to her neuropathy she has very little sensation in her feet.    Review of Systems  Constitutional: Negative.   Skin: Positive for wound.       Objective:   Physical Exam  Constitutional: She appears well-developed and well-nourished.  Musculoskeletal:  The left foot is slightly pink today but not swollen. She has a hammer toe on the 2nd toe and there is a pressure blister on the tip of the toe. This is flat and has no more fluid in it. No purulence seen           Assessment & Plan:  This is a pressure sore on the tip of the toe, and this has developed due to the lack of sensation form her neuropathy. We will cover for infection with Keflex. We will refer her to Podiatry to evaluate what can be done about the hammer toe.

## 2015-03-30 ENCOUNTER — Ambulatory Visit (INDEPENDENT_AMBULATORY_CARE_PROVIDER_SITE_OTHER): Payer: Medicare Other

## 2015-03-30 ENCOUNTER — Ambulatory Visit (INDEPENDENT_AMBULATORY_CARE_PROVIDER_SITE_OTHER): Payer: Medicare Other | Admitting: Podiatry

## 2015-03-30 ENCOUNTER — Encounter: Payer: Self-pay | Admitting: Podiatry

## 2015-03-30 VITALS — BP 148/83 | HR 79 | Resp 12 | Ht 71.5 in | Wt 184.0 lb

## 2015-03-30 DIAGNOSIS — M2042 Other hammer toe(s) (acquired), left foot: Secondary | ICD-10-CM | POA: Diagnosis not present

## 2015-03-30 DIAGNOSIS — L97501 Non-pressure chronic ulcer of other part of unspecified foot limited to breakdown of skin: Secondary | ICD-10-CM

## 2015-03-30 DIAGNOSIS — L89891 Pressure ulcer of other site, stage 1: Secondary | ICD-10-CM

## 2015-03-30 NOTE — Progress Notes (Signed)
   Subjective:    Patient ID: [name], female    DOB: 02/12/1929, 82 y.o.   MRN: [id number]  HPI    Review of Systems  Allergic/Immunologic:       Pt states she is being treated for an autoimmune disease.  All other systems reviewed and are negative.      Objective:   Physical Exam        Assessment & Plan:

## 2015-03-31 ENCOUNTER — Other Ambulatory Visit: Payer: Self-pay | Admitting: Cardiovascular Disease

## 2015-03-31 NOTE — Progress Notes (Signed)
Subjective:     Patient ID: [name], female   DOB: Aug 05, 1929, 81 y.o.   MRN: [id number]  HPI patient presents with painful second toe left foot stating about 2 weeks ago it started develop redness and pain when pressed. Patient states there is irritation of the distal tip and there was some drainage and that she's currently on an antibiotic from her family physician and it's feeling some better   Review of Systems  All other systems reviewed and are negative.      Objective:   Physical Exam  Constitutional: She is oriented to person, place, and time.  Cardiovascular: Intact distal pulses.   Musculoskeletal: Normal range of motion.  Neurological: She is oriented to person, place, and time.  Skin: Skin is warm.  Nursing note and vitals reviewed.  neurovascular status intact muscle strength was adequate with range of motion subtalar midtarsal joint within normal limits. Patient does have dry type skin deformity and has digital deformities lesser toes left over right with distal keratotic lesion second toe left that is painful when pressed and makes wearing shoe gear difficult. It is localized with no proximal edema erythema or drainage noted currently     Assessment:     Ulceration tip of second toe secondary to structural deformity with no indications currently of proximal infection    Plan:     H&P and x-ray reviewed. I explained there may be bone changes but at this point were to treat clinically and using sharp sterile instrumentation I did debridement of lesion and I then went ahead and I flushed the area and applied Iodosorb with occlusion measure approximately 2 x 2 mm with minimal subcutaneous exposure. Applied buttress pad to reduce pressure against the toe and reappoint in the next several weeks and continue antibiotics

## 2015-04-04 ENCOUNTER — Ambulatory Visit (INDEPENDENT_AMBULATORY_CARE_PROVIDER_SITE_OTHER): Payer: Medicare Other

## 2015-04-04 ENCOUNTER — Ambulatory Visit (INDEPENDENT_AMBULATORY_CARE_PROVIDER_SITE_OTHER): Payer: Medicare Other | Admitting: Cardiovascular Disease

## 2015-04-04 ENCOUNTER — Encounter: Payer: Self-pay | Admitting: Cardiovascular Disease

## 2015-04-04 VITALS — BP 118/80 | HR 62 | Ht 71.5 in | Wt 185.8 lb

## 2015-04-04 DIAGNOSIS — I4891 Unspecified atrial fibrillation: Secondary | ICD-10-CM | POA: Diagnosis not present

## 2015-04-04 DIAGNOSIS — Z7901 Long term (current) use of anticoagulants: Secondary | ICD-10-CM | POA: Diagnosis not present

## 2015-04-04 DIAGNOSIS — I482 Chronic atrial fibrillation, unspecified: Secondary | ICD-10-CM

## 2015-04-04 LAB — POCT INR: INR: 3.3

## 2015-04-04 NOTE — Patient Instructions (Signed)
Medication Instructions:  NO CHANGES  Labwork: NONE  Testing/Procedures: NONE  Follow-Up: Your physician wants you to follow-up in: 6 MONTHS WITH DR NISHAN You will receive a reminder letter in the mail two months in advance. If you don't receive a letter, please call our office to schedule the follow-up appointment.  Any Other Special Instructions Will Be Listed Below (If Applicable).   

## 2015-04-04 NOTE — Progress Notes (Signed)
Patient ID: [name], female   DOB: [birth year], 81 y.o.   MRN: [id number]  [name] is seen today for f/U of afib, HTN, and edema. Her coumadin has been Rx. No palpitations, SOB or SSCP. She is walking on a regular basis without symptoms. She retired from [employer] 2 years ago . Her husband [name] died and she is now living in apartment near [place] has worked well as a diuretic for edema as she requires much less K. 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 Started back doing water aerobics at the [facility]   I care for her sister [name] who has had issues with edema and memory   Mild AS  Echo reviewed from 09/2014  Mean gradient 11 mmHG     She is working 20 hrs/week at [employer]  Had right retinal detachment a month ago Rx by Dr Zadie Rhine  Recent foot infection from neuropathy Seeing Dr Paulla Dolly just finished antibiotics   ROS: Denies fever, malais, weight loss, blurry vision, decreased visual acuity, cough, sputum, SOB, hemoptysis, pleuritic pain, palpitaitons, heartburn, abdominal pain, melena, lower extremity edema, claudication, or rash.  All other systems reviewed and negative  General: Affect appropriate Healthy:  appears stated age 81: normal Neck supple with no adenopathy JVP normal no bruits no thyromegaly Lungs clear with no wheezing and good diaphragmatic motion Heart:  S1/S2 AS  murmur, no rub, gallop or click PMI normal Abdomen: benighn, BS positve, no tenderness, no AAA no bruit.  No HSM or HJR Distal pulses intact with no bruits No edema Left foot in boot with healing ulcer  Skin warm and dry No muscular weakness   Current Outpatient Prescriptions  Medication Sig Dispense Refill  . ALPRAZolam (XANAX) 0.25 MG tablet Take 1 tablet (0.25 mg total) by mouth at bedtime as needed for sleep. 30 tablet 5  .  atorvastatin (LIPITOR) 20 MG tablet TAKE 1 TABLET DAILY 90 tablet 3  . Calcium Carbonate-Vitamin D (CALTRATE 600+D) 600-400 MG-UNIT per tablet Take 1 tablet by mouth daily.      . carvedilol (COREG) 6.25 MG tablet Take 1 tablet (6.25 mg total) by mouth 2 (two) times daily. 180 tablet 1  . estradiol (CLIMARA - DOSED IN MG/24 HR) 0.1 mg/24hr Place 1 patch onto the skin once a week.      . furosemide (LASIX) 40 MG tablet TAKE 1 TABLET DAILY 90 tablet 0  . ketorolac (ACULAR) 0.5 % ophthalmic solution Place 1 drop into the right eye 2 (two) times daily.    Marland Kitchen levothyroxine (SYNTHROID, LEVOTHROID) 125 MCG tablet Take 1 tablet (125 mcg total) by mouth daily. 90 tablet 4  . meclizine (ANTIVERT) 25 MG tablet TAKE 1/2 OR 1 TABLET BY MOUTH EVERY 6 HOURS AS NEEDED FOR DIZZINESS 30 tablet 5  . meloxicam (MOBIC) 7.5 MG tablet TAKE 1 TABLET DAILY 90 tablet 1  . Multiple Vitamins-Minerals (CENTRUM SILVER PO) Take 1 tablet by mouth daily.      . potassium chloride SA (K-DUR,KLOR-CON) 20 MEQ tablet TAKE 1 TABLET TWICE A DAY 180 tablet 0  . quinapril (ACCUPRIL) 20 MG tablet TAKE 1 TABLET DAILY 90 tablet 0  . spironolactone (ALDACTONE) 25 MG tablet Take 1 tablet (25 mg total) by mouth daily. 90 tablet 3  . vitamin B-12 (CYANOCOBALAMIN) 1000 MCG tablet  Take 1,000 mcg by mouth daily.      Marland Kitchen warfarin (COUMADIN) 5 MG tablet TAKE AS DIRECTED BY ANTICOAGULATION CLINIC 120 tablet 1   No current facility-administered medications for this visit.    Allergies  Codeine  Electrocardiogram:  4/14  afib rate 64  Nonspecific ST/T wave changes  Today afib rate 63 nonspecific St changes similar to 2014  Assessment and Plan Afib:  Chronic good rate control INR high today due to recent antibiotics no bleeding issues stable  Neuropathy:  F/u Regal ulcer healing done with course of antibiotics HTN:  Well controlled.  Continue current medications and low sodium Dash type diet.   Chol:  On statin  Cholesterol is at goal.   Continue current dose of statin and diet Rx.  No myalgias or side effects.  F/U  LFT's in 6 months. Lab Results  Component Value Date   LDLCALC 69 03/11/2015

## 2015-04-07 ENCOUNTER — Ambulatory Visit: Payer: Medicare Other | Admitting: Podiatry

## 2015-04-12 ENCOUNTER — Encounter: Payer: Self-pay | Admitting: Podiatry

## 2015-04-12 ENCOUNTER — Ambulatory Visit (INDEPENDENT_AMBULATORY_CARE_PROVIDER_SITE_OTHER): Payer: Medicare Other | Admitting: Podiatry

## 2015-04-12 VITALS — BP 128/60 | HR 68 | Resp 12

## 2015-04-12 DIAGNOSIS — L97501 Non-pressure chronic ulcer of other part of unspecified foot limited to breakdown of skin: Secondary | ICD-10-CM

## 2015-04-12 DIAGNOSIS — M2042 Other hammer toe(s) (acquired), left foot: Secondary | ICD-10-CM | POA: Diagnosis not present

## 2015-04-12 DIAGNOSIS — L89891 Pressure ulcer of other site, stage 1: Secondary | ICD-10-CM | POA: Diagnosis not present

## 2015-04-13 NOTE — Progress Notes (Signed)
Subjective:     Patient ID: [name], female   DOB: 1929/08/04, 81 y.o.   MRN: [id number]  HPI patient states my left second toe is better but I still noted a little bit of drainage   Review of Systems     Objective:   Physical Exam Neurovascular status unchanged previous visit with no change in health history and noted to have a crusted distal second toe left with a small 1 x 1 mm opening in the center that is localized with no proximal edema erythema or drainage noted    Assessment:     Localized breakdown of tissue left second toe distal that is healing very well    Plan:     Advised on continuation of surgical shoe for several more weeks and the utilization of buttress pad to elevate the second toe. Advised on continued soaks and inspections and reappoint if any drainage or breakage of skin should occur but if not this should heal uneventfully

## 2015-04-19 ENCOUNTER — Ambulatory Visit (INDEPENDENT_AMBULATORY_CARE_PROVIDER_SITE_OTHER): Payer: Medicare Other | Admitting: *Deleted

## 2015-04-19 ENCOUNTER — Encounter: Payer: Self-pay | Admitting: Family Medicine

## 2015-04-19 ENCOUNTER — Ambulatory Visit (INDEPENDENT_AMBULATORY_CARE_PROVIDER_SITE_OTHER): Payer: Medicare Other | Admitting: Family Medicine

## 2015-04-19 VITALS — BP 118/71 | HR 78 | Temp 98.1°F | Ht 71.5 in | Wt 184.0 lb

## 2015-04-19 DIAGNOSIS — Z7901 Long term (current) use of anticoagulants: Secondary | ICD-10-CM | POA: Diagnosis not present

## 2015-04-19 DIAGNOSIS — M159 Polyosteoarthritis, unspecified: Secondary | ICD-10-CM

## 2015-04-19 DIAGNOSIS — I482 Chronic atrial fibrillation, unspecified: Secondary | ICD-10-CM

## 2015-04-19 DIAGNOSIS — I10 Essential (primary) hypertension: Secondary | ICD-10-CM | POA: Diagnosis not present

## 2015-04-19 DIAGNOSIS — M150 Primary generalized (osteo)arthritis: Secondary | ICD-10-CM

## 2015-04-19 DIAGNOSIS — I4891 Unspecified atrial fibrillation: Secondary | ICD-10-CM

## 2015-04-19 DIAGNOSIS — C189 Malignant neoplasm of colon, unspecified: Secondary | ICD-10-CM

## 2015-04-19 DIAGNOSIS — F411 Generalized anxiety disorder: Secondary | ICD-10-CM

## 2015-04-19 LAB — POCT INR: INR: 3

## 2015-04-19 MED ORDER — MECLIZINE HCL 25 MG PO TABS: ORAL_TABLET | ORAL | Status: DC

## 2015-04-19 MED ORDER — ALPRAZOLAM 0.25 MG PO TABS: 0.2500 mg | ORAL_TABLET | Freq: Every evening | ORAL | Status: DC | PRN

## 2015-04-19 NOTE — Progress Notes (Signed)
Pre visit review using our clinic review tool, if applicable. No additional management support is needed unless otherwise documented below in the visit note. 

## 2015-04-19 NOTE — Progress Notes (Signed)
   Subjective:    Patient ID: [name], female    DOB: 01/20/1929, 81 y.o.   MRN: [id number]  HPI 81 yr old female for a cpx. She feels well. She got a good report from Dr. Johnsie Cancel a few weeks ago. She will see her GYN later this week.    Review of Systems  Constitutional: Negative.   HENT: Negative.   Eyes: Negative.   Respiratory: Negative.   Cardiovascular: Negative.   Gastrointestinal: Negative.   Genitourinary: Negative for dysuria, urgency, frequency, hematuria, flank pain, decreased urine volume, enuresis, difficulty urinating, pelvic pain and dyspareunia.  Musculoskeletal: Negative.   Skin: Negative.   Neurological: Negative.   Psychiatric/Behavioral: Negative.        Objective:   Physical Exam  Constitutional: She is oriented to person, place, and time. She appears well-developed and well-nourished. No distress.  HENT:  Head: Normocephalic and atraumatic.  Right Ear: External ear normal.  Left Ear: External ear normal.  Nose: Nose normal.  Mouth/Throat: Oropharynx is clear and moist. No oropharyngeal exudate.  Eyes: Conjunctivae and EOM are normal. Pupils are equal, round, and reactive to light. No scleral icterus.  Neck: Normal range of motion. Neck supple. No JVD present. No thyromegaly present.  Cardiovascular: Normal rate and intact distal pulses.  Exam reveals no gallop and no friction rub.   Irregular rhythm. Has her usual 2/6 SM over the mitral area   Pulmonary/Chest: Effort normal and breath sounds normal. No respiratory distress. She has no wheezes. She has no rales. She exhibits no tenderness.  Abdominal: Soft. Bowel sounds are normal. She exhibits no distension and no mass. There is no tenderness. There is no rebound and no guarding.  Musculoskeletal: Normal range of motion. She exhibits no edema or tenderness.  Lymphadenopathy:    She has no cervical adenopathy.  Neurological: She is alert and oriented to person, place, and time. She has normal  reflexes. No cranial nerve deficit. She exhibits normal muscle tone. Coordination normal.  Skin: Skin is warm and dry. No rash noted. No erythema.  Psychiatric: She has a normal mood and affect. Her behavior is normal. Judgment and thought content normal.          Assessment & Plan:  Well exam. Her atrial fibrillation is stable. Her hypothyroidism is well controlled. She is due for another colonoscopy with Dr. Henrene Pastor so we will set this up.

## 2015-04-20 ENCOUNTER — Encounter: Payer: Self-pay | Admitting: Internal Medicine

## 2015-04-22 ENCOUNTER — Other Ambulatory Visit: Payer: Self-pay | Admitting: Family Medicine

## 2015-04-26 ENCOUNTER — Encounter: Payer: Self-pay | Admitting: Podiatry

## 2015-04-26 ENCOUNTER — Ambulatory Visit (INDEPENDENT_AMBULATORY_CARE_PROVIDER_SITE_OTHER): Payer: Medicare Other | Admitting: Podiatry

## 2015-04-26 ENCOUNTER — Ambulatory Visit (INDEPENDENT_AMBULATORY_CARE_PROVIDER_SITE_OTHER): Payer: Medicare Other

## 2015-04-26 ENCOUNTER — Other Ambulatory Visit: Payer: Self-pay | Admitting: Cardiovascular Disease

## 2015-04-26 VITALS — BP 146/84 | HR 76 | Resp 12

## 2015-04-26 DIAGNOSIS — L97501 Non-pressure chronic ulcer of other part of unspecified foot limited to breakdown of skin: Secondary | ICD-10-CM | POA: Diagnosis not present

## 2015-04-26 DIAGNOSIS — R52 Pain, unspecified: Secondary | ICD-10-CM | POA: Diagnosis not present

## 2015-04-26 MED ORDER — AMOXICILLIN 500 MG PO CAPS: 500.0000 mg | ORAL_CAPSULE | Freq: Three times a day (TID) | ORAL | Status: DC

## 2015-04-26 NOTE — Progress Notes (Signed)
Subjective:     Patient ID: [name], female   DOB: 1929/04/18, 81 y.o.   MRN: [id number]  HPI patient presents with red irritated distal area of the second toe left stating that she had a couple weeks were was real good and that it just started to reoccur again in the last couple days and it's been red and the entire toe and she is concerned about   Review of Systems     Objective:   Physical Exam Neurovascular status unchanged with diminishment of sharp [employer] vibratory and noted on the distal aspect left second toe to have keratotic tissue which is reformed with no odor noted. There is some slight breakage of skin but I did not note any significant active drainage and there was erythema within the second toe itself but it did not extend in the forefoot midfoot and she has no systemic signs of infection    Assessment:     Localized distal abscess left second toe which possibly could involve bone    Plan:     Reviewed condition with patient and at this time did deep debridement of the area and cultured and we will get results of culture. I advised on soaks and continuing to wear open toed shoes and I reviewed her x-ray which does not currently indicate change but we'll need to be watched. At this time she understands at one point she may lose this toe and that she's develop any systemic signs of infection she is to go straight to the emergency room and contact us

## 2015-04-29 ENCOUNTER — Other Ambulatory Visit: Payer: Self-pay

## 2015-04-29 MED ORDER — WARFARIN SODIUM 5 MG PO TABS: ORAL_TABLET | ORAL | Status: DC

## 2015-05-02 ENCOUNTER — Telehealth: Payer: Self-pay | Admitting: *Deleted

## 2015-05-02 NOTE — Telephone Encounter (Signed)
Pt states she has been unable to work, since the last visit with Dr. Paulla Dolly and she does not feel she will be able to work her part time job at [employer]. Pt request to be out of work until seen by Dr. Paulla Dolly on 05/10/2015.  Dr. Paulla Dolly states pt can be out of work until reevaluated on 05/10/2015.  Note written and pt is to pick up in the front reception of the [place] office.

## 2015-05-10 ENCOUNTER — Encounter: Payer: Self-pay | Admitting: Podiatry

## 2015-05-10 ENCOUNTER — Ambulatory Visit (INDEPENDENT_AMBULATORY_CARE_PROVIDER_SITE_OTHER): Payer: Medicare Other | Admitting: Podiatry

## 2015-05-10 ENCOUNTER — Ambulatory Visit (INDEPENDENT_AMBULATORY_CARE_PROVIDER_SITE_OTHER): Payer: Medicare Other | Admitting: *Deleted

## 2015-05-10 VITALS — BP 123/73 | HR 91 | Temp 97.8°F | Resp 12

## 2015-05-10 DIAGNOSIS — I4891 Unspecified atrial fibrillation: Secondary | ICD-10-CM

## 2015-05-10 DIAGNOSIS — Z7901 Long term (current) use of anticoagulants: Secondary | ICD-10-CM | POA: Diagnosis not present

## 2015-05-10 DIAGNOSIS — L97501 Non-pressure chronic ulcer of other part of unspecified foot limited to breakdown of skin: Secondary | ICD-10-CM | POA: Diagnosis not present

## 2015-05-10 DIAGNOSIS — M2042 Other hammer toe(s) (acquired), left foot: Secondary | ICD-10-CM

## 2015-05-10 LAB — POCT INR: INR: 2.1

## 2015-05-10 NOTE — Progress Notes (Signed)
   Subjective:    Patient ID: [name], female    DOB: August 16, 1929, 81 y.o.   MRN: [id number]  HPI  Patient's L 2nd toe is doing great. Still some inflammation and tenderness. Overall improvement. Soaking and taking medication regularly.  Review of Systems  Skin: Positive for color change and wound.  All other systems reviewed and are negative.      Objective:   Physical Exam        Assessment & Plan:

## 2015-05-11 ENCOUNTER — Other Ambulatory Visit: Payer: Self-pay | Admitting: Obstetrics & Gynecology

## 2015-05-11 DIAGNOSIS — N958 Other specified menopausal and perimenopausal disorders: Secondary | ICD-10-CM | POA: Diagnosis not present

## 2015-05-11 DIAGNOSIS — Z124 Encounter for screening for malignant neoplasm of cervix: Secondary | ICD-10-CM | POA: Diagnosis not present

## 2015-05-11 DIAGNOSIS — Z1272 Encounter for screening for malignant neoplasm of vagina: Secondary | ICD-10-CM | POA: Diagnosis not present

## 2015-05-11 DIAGNOSIS — Z6826 Body mass index (BMI) 26.0-26.9, adult: Secondary | ICD-10-CM | POA: Diagnosis not present

## 2015-05-11 DIAGNOSIS — M8588 Other specified disorders of bone density and structure, other site: Secondary | ICD-10-CM | POA: Diagnosis not present

## 2015-05-11 DIAGNOSIS — N760 Acute vaginitis: Secondary | ICD-10-CM | POA: Diagnosis not present

## 2015-05-11 DIAGNOSIS — Z01419 Encounter for gynecological examination (general) (routine) without abnormal findings: Secondary | ICD-10-CM | POA: Diagnosis not present

## 2015-05-11 DIAGNOSIS — Z1231 Encounter for screening mammogram for malignant neoplasm of breast: Secondary | ICD-10-CM | POA: Diagnosis not present

## 2015-05-12 LAB — CYTOLOGY - PAP

## 2015-05-16 ENCOUNTER — Encounter: Payer: Self-pay | Admitting: Internal Medicine

## 2015-05-16 ENCOUNTER — Telehealth: Payer: Self-pay | Admitting: *Deleted

## 2015-05-16 NOTE — Telephone Encounter (Signed)
Patient called to inform us that she has taken diflucan (fluconazole) 150mg  for 2 doses, she took the last dose today.  Advise that the medication can interfere with coumadin and we can check her INR earlier than scheduled appt.  Advised to eat extra greens today and return to normal return afterwards and call with any other medication changes. She verbalized understanding.

## 2015-05-16 NOTE — Progress Notes (Signed)
Subjective:     Patient ID: [name], female   DOB: Jul 12, 1929, 81 y.o.   MRN: [id number]  HPI patient states second toe on my left foot is improving quite a bit with distal keratotic tissue still present but drainage that has reduced   Review of Systems     Objective:   Physical Exam Neurovascular status intact with significant diminishment of thickness in the distal portion of the second toe left with no redness or swelling of the toe noted    Assessment:     Improved from distal ulceration second toe left with distal keratotic tissue    Plan:     Debride tissue and at this time applied padding to reduce pressure against the toe. Reappoint to recheck

## 2015-05-28 ENCOUNTER — Other Ambulatory Visit: Payer: Self-pay | Admitting: Cardiovascular Disease

## 2015-06-07 ENCOUNTER — Ambulatory Visit (INDEPENDENT_AMBULATORY_CARE_PROVIDER_SITE_OTHER): Payer: Medicare Other | Admitting: *Deleted

## 2015-06-07 DIAGNOSIS — Z7901 Long term (current) use of anticoagulants: Secondary | ICD-10-CM | POA: Diagnosis not present

## 2015-06-07 DIAGNOSIS — I4891 Unspecified atrial fibrillation: Secondary | ICD-10-CM | POA: Diagnosis not present

## 2015-06-07 LAB — POCT INR: INR: 2.6

## 2015-06-11 ENCOUNTER — Other Ambulatory Visit: Payer: Self-pay | Admitting: Cardiovascular Disease

## 2015-06-20 ENCOUNTER — Telehealth: Payer: Self-pay | Admitting: Family Medicine

## 2015-06-20 NOTE — Telephone Encounter (Signed)
Pt needs refill on meloxicam 7.5 mg #90 w/[facility]

## 2015-06-21 ENCOUNTER — Telehealth: Payer: Self-pay

## 2015-06-21 ENCOUNTER — Ambulatory Visit (INDEPENDENT_AMBULATORY_CARE_PROVIDER_SITE_OTHER): Payer: Medicare Other | Admitting: Internal Medicine

## 2015-06-21 ENCOUNTER — Encounter: Payer: Self-pay | Admitting: Internal Medicine

## 2015-06-21 VITALS — BP 122/70 | HR 68 | Ht 69.5 in | Wt 182.2 lb

## 2015-06-21 DIAGNOSIS — Z86010 Personal history of colonic polyps: Secondary | ICD-10-CM | POA: Diagnosis not present

## 2015-06-21 DIAGNOSIS — Z85038 Personal history of other malignant neoplasm of large intestine: Secondary | ICD-10-CM | POA: Diagnosis not present

## 2015-06-21 DIAGNOSIS — I482 Chronic atrial fibrillation, unspecified: Secondary | ICD-10-CM

## 2015-06-21 DIAGNOSIS — Z7901 Long term (current) use of anticoagulants: Secondary | ICD-10-CM

## 2015-06-21 MED ORDER — NA SULFATE-K SULFATE-MG SULF 17.5-3.13-1.6 GM/177ML PO SOLN: 1.0000 | Freq: Once | ORAL | Status: DC

## 2015-06-21 MED ORDER — MELOXICAM 7.5 MG PO TABS: 7.5000 mg | ORAL_TABLET | Freq: Every day | ORAL | Status: DC

## 2015-06-21 NOTE — Progress Notes (Signed)
HISTORY OF PRESENT ILLNESS:  [name] is a 81 y.o. female with multiple medical problems including hypertension, chronic atrial fibrillation for which she is on chronic Coumadin therapy, peripheral vascular disease, hyperlipidemia, hypothyroidism, osteoarthritis, peripheral neuropathy, and anxiety. She also has a history of colon cancer for which she underwent right hemicolectomy in 1994. She is undergone multiple surveillance colonoscopies since, the most recent 05/25/2010. The examination at that time revealed an 8 mm adenoma which was resected and a diminutive polyp which was also resected without pathology. Evidence of right hemicolectomy and sigmoid diverticulosis. Patient has come off Coumadin 5 days prior to her previous examinations. She has been in good health since her last colonoscopy. No hospitalizations over the past several years. Chronic medical problems are stable. She remains quite active. She does mention some intermittent left upper quadrant discomfort over the past 3 weeks. This is not exacerbated or relieved by meals, body position, defecation, or activity. It generally lasts less than a few minutes and is not associated with other symptoms. It has not increased in frequency or intensity  REVIEW OF SYSTEMS:  All non-GI ROS negative except for arthritis, irregular heart rate  Past Medical History  Diagnosis Date  . Hypertension   . Chest pain, unspecified   . Atrial fibrillation   . Mitral valve disorder   . Patent foramen ovale   . Peripheral vascular disease   . Venous insufficiency   . Hyperlipidemia   . Impaired glucose tolerance   . Hypothyroid   . Diverticulosis of colon   . History of colonic polyps   . History of colon cancer   . Splenic infarction   . History of uterine cancer   . UTI (lower urinary tract infection)   . Osteoarthritis   . Chronic low back pain   . Osteoporosis   . Peripheral neuropathy   . Anxiety   . Vitamin B12 deficiency   .  History of colonoscopy     Past Surgical History  Procedure Laterality Date  . Hysterectomy for uterine cancer    . Anterior and posterior vaginal repair  03/2006    Dr.Neal  . Basal cell skin cancer moh's surgery    . Right hemicolectomy for colon cancer  1994  . Lumbar laminectomy for spinal stenosis  2006    L3-4  . Left carpal tunnel surgery  02/2007    Dr. Daylene Katayama  . Retinal detachment repair w/ scleral buckle le Right 03-01-14    per Dr. Zadie Rhine   . Colonoscopy  05-25-10    per Dr. Henrene Pastor, benign polyps, repeat in 5 yrs   . Eye surgery      Social History [name]  reports that she has never smoked. She has never used smokeless tobacco. She reports that she drinks alcohol. She reports that she does not use illicit drugs.  family history includes Cancer in her brother and mother; Heart failure in her father; Stroke in her sister.  Allergies  Allergen Reactions  . Codeine     REACTION: nausea       PHYSICAL EXAMINATION: Vital signs: BP 122/70 mmHg  Pulse 68  Ht 5' 9.5" (1.765 m)  Wt 182 lb 4 oz (82.668 kg)  BMI 26.54 kg/m2 General: Well-developed, well-nourished, no acute distress HEENT: Sclerae are anicteric, conjunctiva pink. Oral mucosa intact Lungs: Clear Heart: Irregular Abdomen: soft, nontender, nondistended, no obvious ascites, no peritoneal signs, normal bowel sounds. No organomegaly. Prior surgical incision well-healed Extremities: No clubbing cyanosis or  edema Psychiatric: alert and oriented x3. Cooperative   ASSESSMENT:  #1. Personal history of colon cancer status post right hemicolectomy 1994. Also, history of adenomatous colon polyps as recently as 2011 #2. Multiple medical problems including chronic atrial fibrillation for which she is on Coumadin #3. Minor intermittent left upper quadrant discomfort as described. Most likely musculoskeletal. Recommend observation at this time.   PLAN:  #1. We discussed the pros and cons of surveillance  colonoscopy at her age. She is interested in colonoscopy. I support this given her overall health status. She is hard to baseline risk due to the need to regulate anticoagulation. We discussed the pros and cons of procedural work on and off anticoagulation. She is aware. She will hold Coumadin 5 days prior to the exam as has been done previously.The nature of the procedure, as well as the risks, benefits, and alternatives were carefully and thoroughly reviewed with the patient. Ample time for discussion and questions allowed. The patient understood, was satisfied, and agreed to proceed.

## 2015-06-21 NOTE — Patient Instructions (Signed)
You have been scheduled for a colonoscopy. Please follow written instructions given to you at your visit today.  Please pick up your prep supplies at the pharmacy within the next 1-3 days. If you use inhalers (even only as needed), please bring them with you on the day of your procedure.   

## 2015-06-21 NOTE — Telephone Encounter (Signed)
I sent script e-scribe. 

## 2015-06-29 ENCOUNTER — Other Ambulatory Visit: Payer: Self-pay | Admitting: Cardiovascular Disease

## 2015-06-29 DIAGNOSIS — M79672 Pain in left foot: Secondary | ICD-10-CM | POA: Diagnosis not present

## 2015-06-29 NOTE — Telephone Encounter (Signed)
Blood thinner held 5 days per Dr. Henrene Pastor

## 2015-07-04 DIAGNOSIS — H35371 Puckering of macula, right eye: Secondary | ICD-10-CM | POA: Diagnosis not present

## 2015-07-04 DIAGNOSIS — H35351 Cystoid macular degeneration, right eye: Secondary | ICD-10-CM | POA: Diagnosis not present

## 2015-07-12 ENCOUNTER — Ambulatory Visit (INDEPENDENT_AMBULATORY_CARE_PROVIDER_SITE_OTHER): Payer: Medicare Other | Admitting: Pharmacist

## 2015-07-12 DIAGNOSIS — Z7901 Long term (current) use of anticoagulants: Secondary | ICD-10-CM

## 2015-07-12 DIAGNOSIS — I4891 Unspecified atrial fibrillation: Secondary | ICD-10-CM

## 2015-07-12 LAB — POCT INR: INR: 2.6

## 2015-07-29 ENCOUNTER — Encounter: Payer: Self-pay | Admitting: Family Medicine

## 2015-07-29 ENCOUNTER — Ambulatory Visit (INDEPENDENT_AMBULATORY_CARE_PROVIDER_SITE_OTHER): Payer: Medicare Other | Admitting: Family Medicine

## 2015-07-29 VITALS — BP 139/88 | HR 74 | Temp 97.8°F | Ht 69.5 in | Wt 185.0 lb

## 2015-07-29 DIAGNOSIS — M25572 Pain in left ankle and joints of left foot: Secondary | ICD-10-CM

## 2015-07-29 LAB — URIC ACID: URIC ACID, SERUM: 7.2 mg/dL — AB (ref 2.4–7.0)

## 2015-07-29 MED ORDER — METHYLPREDNISOLONE ACETATE 80 MG/ML IJ SUSP
120.0000 mg | Freq: Once | INTRAMUSCULAR | Status: AC
  Administered 2015-07-29: 120 mg via INTRAMUSCULAR

## 2015-07-29 MED ORDER — METHYLPREDNISOLONE 4 MG PO TBPK: ORAL_TABLET | ORAL | Status: DC

## 2015-07-29 NOTE — Progress Notes (Signed)
   Subjective:    Patient ID: [name], female    DOB: 03-06-1929, 81 y.o.   MRN: [id number]  HPI Here for 3 days of warmth, swelling, and pain in the left foot. No recent trauma. She was treated by Dr. Paulla Dolly several months ago for an infection in the left 2nd toe and this has resolved completely. She saw Dr. Novella Olive about 3 weeks ago and he said she just had some arthritis in the foot. She is wearing compression stockings.    Review of Systems  Constitutional: Negative.   Respiratory: Negative.   Cardiovascular: Negative.   Musculoskeletal: Positive for joint swelling and arthralgias.       Objective:   Physical Exam  Constitutional: She appears well-developed and well-nourished.  Musculoskeletal:  The left foot is mildly swollen but not red. It is warm to touch and tender around the top of the foot at the tibiotarsal junction. ROM is limited by pain. The toes look fine           Assessment & Plan:  Foot pain due to an inflammatory process, not sure if this is gout or not. Given a steroid shot and a Medrol dose pack. Check a uric acid level.

## 2015-07-29 NOTE — Progress Notes (Signed)
Pre visit review using our clinic review tool, if applicable. No additional management support is needed unless otherwise documented below in the visit note. 

## 2015-07-29 NOTE — Addendum Note (Signed)
Addended by: Aggie Hacker A on: 07/29/2015 04:05 PM   Modules accepted: Orders

## 2015-08-02 ENCOUNTER — Telehealth: Payer: Self-pay

## 2015-08-02 NOTE — Telephone Encounter (Signed)
Pt called states she went to MD on Friday 07/29/15, received shot of DepoMedrol in office, then started on a Methylprednisolone 4mg  dose pack on Saturday 07/30/15.  Advised pt this is a low dose steroid and usually has minimal interaction with Coumadin.  Advised no need to recheck INR sooner than scheduled follow-up on 08/23/15.  Pt denies any other new medications.  Advised pt to keep scheduled f/u and call back with any other new medications.

## 2015-08-23 ENCOUNTER — Ambulatory Visit (INDEPENDENT_AMBULATORY_CARE_PROVIDER_SITE_OTHER): Payer: Medicare Other | Admitting: *Deleted

## 2015-08-23 DIAGNOSIS — I4891 Unspecified atrial fibrillation: Secondary | ICD-10-CM

## 2015-08-23 DIAGNOSIS — Z7901 Long term (current) use of anticoagulants: Secondary | ICD-10-CM | POA: Diagnosis not present

## 2015-08-23 LAB — POCT INR: INR: 2.6

## 2015-08-23 NOTE — Patient Instructions (Signed)
08/27/15-Take your last your dose of Coumadin for upcoming Colonoscopy   09/02/15-Procedure date  Resume Coumadin when instructed to do so by Physician.  When you resume take an extra 1/2 tablet for 2 days then resume normal dose.   Cal with any questions [phone]

## 2015-08-25 ENCOUNTER — Other Ambulatory Visit: Payer: Self-pay | Admitting: Cardiovascular Disease

## 2015-08-28 ENCOUNTER — Other Ambulatory Visit: Payer: Self-pay | Admitting: Cardiovascular Disease

## 2015-09-02 ENCOUNTER — Ambulatory Visit (AMBULATORY_SURGERY_CENTER): Payer: Medicare Other | Admitting: Internal Medicine

## 2015-09-02 ENCOUNTER — Encounter: Payer: Self-pay | Admitting: Internal Medicine

## 2015-09-02 VITALS — BP 114/66 | HR 65 | Temp 96.3°F | Resp 20 | Ht 69.5 in | Wt 182.0 lb

## 2015-09-02 DIAGNOSIS — M545 Low back pain: Secondary | ICD-10-CM | POA: Diagnosis not present

## 2015-09-02 DIAGNOSIS — Z85038 Personal history of other malignant neoplasm of large intestine: Secondary | ICD-10-CM

## 2015-09-02 DIAGNOSIS — Z1211 Encounter for screening for malignant neoplasm of colon: Secondary | ICD-10-CM | POA: Diagnosis not present

## 2015-09-02 DIAGNOSIS — I4891 Unspecified atrial fibrillation: Secondary | ICD-10-CM | POA: Diagnosis not present

## 2015-09-02 DIAGNOSIS — R42 Dizziness and giddiness: Secondary | ICD-10-CM | POA: Diagnosis not present

## 2015-09-02 DIAGNOSIS — E039 Hypothyroidism, unspecified: Secondary | ICD-10-CM | POA: Diagnosis not present

## 2015-09-02 DIAGNOSIS — I10 Essential (primary) hypertension: Secondary | ICD-10-CM | POA: Diagnosis not present

## 2015-09-02 DIAGNOSIS — I739 Peripheral vascular disease, unspecified: Secondary | ICD-10-CM | POA: Diagnosis not present

## 2015-09-02 MED ORDER — SODIUM CHLORIDE 0.9 % IV SOLN: 500.0000 mL | INTRAVENOUS | Status: DC

## 2015-09-02 NOTE — Op Note (Signed)
[place]  [address] [place], [id number]   COLONOSCOPY PROCEDURE REPORT  PATIENT: [name], [name]  MR#: [id number] BIRTHDATE: 1928-12-18 , 86  yrs. old GENDER: female ENDOSCOPIST: Eustace Quail, MD REFERRED [id number] Program Recall PROCEDURE DATE:  09/02/2015 PROCEDURE:   Colonoscopy, surveillance First Screening Colonoscopy - Avg.  risk and is 50 yrs.  old or older - No.  Prior Negative Screening - Now for repeat screening. N/A  History of Adenoma - Now for follow-up colonoscopy & has been > or = to 3 yrs.  N/A  Polyps removed today? No Recommend repeat exam, <10 yrs? No ASA CLASS:   Class III INDICATIONS:Screening for colonic neoplasia and PH Colon or Rectal Adenocarcinoma. . Right colon cancer 1994 status post right hemicolectomy. Multiple interval colonoscopies. Last colonoscopy June 2011 with small tubular adenoma. MEDICATIONS: Monitored anesthesia care and Propofol 200 mg IV  DESCRIPTION OF PROCEDURE:   After the risks benefits and alternatives of the procedure were thoroughly explained, informed consent was obtained.  The digital rectal exam revealed no abnormalities of the rectum.   The LB [id number]  endoscope was introduced through the anus and advanced to the surgical anastomosis. No adverse events experienced.   The quality of the prep was excellent.  (Suprep was used)  The instrument was then slowly withdrawn as the colon was fully examined. Estimated blood loss is zero unless otherwise noted in this procedure report.     COLON FINDINGS: There was evidence of a prior ileocolonic surgical anastomosis in the right colon.   There was moderate diverticulosis noted in the sigmoid colon.   The examination was otherwise normal. Retroflexed views revealed internal hemorrhoids. The time to cecum = 4.6 Withdrawal time = 10.2   The scope was withdrawn and the procedure completed. COMPLICATIONS: There were no immediate  complications.  ENDOSCOPIC IMPRESSION: 1.   There was evidence of a prior ileocolonic surgical anastomosis in the right colon 2.   Moderate diverticulosis was noted in the sigmoid colon 3.   The examination was otherwise normal  RECOMMENDATIONS: 1. Return to the care of your primary provider.  GI follow up as needed 2. Resume Coumadin today. Follow-up with her Coumadin clinic to check your INR in one week  eSigned:  Eustace Quail, MD 09/02/2015 9:06 AM   cc: The Patient and Laurey Morale, MD

## 2015-09-02 NOTE — Patient Instructions (Signed)
YOU HAD AN ENDOSCOPIC PROCEDURE TODAY AT [facility]:   Refer to the procedure report that was given to you for any specific questions about what was found during the examination.  If the procedure report does not answer your questions, please call your gastroenterologist to clarify.  If you requested that your care partner not be given the details of your procedure findings, then the procedure report has been included in a sealed envelope for you to review at your convenience later.  YOU SHOULD EXPECT: Some feelings of bloating in the abdomen. Passage of more gas than usual.  Walking can help get rid of the air that was put into your GI tract during the procedure and reduce the bloating. If you had a lower endoscopy (such as a colonoscopy or flexible sigmoidoscopy) you may notice spotting of blood in your stool or on the toilet paper. If you underwent a bowel prep for your procedure, you may not have a normal bowel movement for a few days.  Please Note:  You might notice some irritation and congestion in your nose or some drainage.  This is from the oxygen used during your procedure.  There is no need for concern and it should clear up in a day or so.  SYMPTOMS TO REPORT IMMEDIATELY:   Following lower endoscopy (colonoscopy or flexible sigmoidoscopy):  Excessive amounts of blood in the stool  Significant tenderness or worsening of abdominal pains  Swelling of the abdomen that is new, acute  Fever of 100F or higher  For urgent or emergent issues, a gastroenterologist can be reached at any hour by calling [phone].   DIET: Your first meal following the procedure should be a small meal and then it is ok to progress to your normal diet. Heavy or fried foods are harder to digest and may make you feel nauseous or bloated.  Likewise, meals heavy in dairy and vegetables can increase bloating.  Drink plenty of fluids but you should avoid alcoholic beverages for 24  hours.  ACTIVITY:  You should plan to take it easy for the rest of today and you should NOT DRIVE or use heavy machinery until tomorrow (because of the sedation medicines used during the test).    FOLLOW UP: Our staff will call the number listed on your records the next business day following your procedure to check on you and address any questions or concerns that you may have regarding the information given to you following your procedure. If we do not reach you, we will leave a message.  However, if you are feeling well and you are not experiencing any problems, there is no need to return our call.  We will assume that you have returned to your regular daily activities without incident.  If any biopsies were taken you will be contacted by phone or by letter within the next 1-3 weeks.  Please call us at [phone] if you have not heard about the biopsies in 3 weeks.    SIGNATURES/CONFIDENTIALITY: You and/or your care partner have signed paperwork which will be entered into your electronic medical record.  These signatures attest to the fact that that the information above on your After Visit Summary has been reviewed and is understood.  Full responsibility of the confidentiality of this discharge information lies with you and/or your care-partner.  Diverticulosis, high fiber diet-handouts given  Resume coumadin today and follow-up with coumadin clinic in 1 week to check INR.

## 2015-09-02 NOTE — Progress Notes (Signed)
Report to PACU, RN, vss, BBS= Clear.  

## 2015-09-05 ENCOUNTER — Telehealth: Payer: Self-pay | Admitting: *Deleted

## 2015-09-05 NOTE — Telephone Encounter (Signed)
Sorry for her experience. Glad she is doing better however

## 2015-09-05 NOTE — Telephone Encounter (Signed)
  Follow up Call-  Call back number 09/02/2015  Post procedure Call Back phone  # [phone]  Permission to leave phone message Yes     Patient questions:  Do you have a fever, pain , or abdominal swelling? No. Pain Score  0 *  Have you tolerated food without any problems? Yes.    Have you been able to return to your normal activities? Yes.    Do you have any questions about your discharge instructions: Diet   No. Medications  No. Follow up visit  No.  Do you have questions or concerns about your Care? Yes.    Actions: * If pain score is 4 or above: No action needed, pain <4. Pt states she complained in the procedure room that the oxygen was bothering her.  The tube was repositioned in her nose and she shortly thereafter went to sleep.  Friday night when she returned home her nose was burning terribly and eyes were watering.  She called the Dr on call and they told her they had never heard of anything like this happening in their 25 yrs of practice.  She went to her pharmacy and they gave her something and it is better.  She states she has had oxygen before and this has never happened.  I offered to pass this on to Dr Henrene Pastor but she stated" I have this taken care of as of right now and thank you for calling."  She hung up the phone before I could say anything else.

## 2015-09-07 ENCOUNTER — Other Ambulatory Visit: Payer: Self-pay | Admitting: Cardiovascular Disease

## 2015-09-08 ENCOUNTER — Ambulatory Visit (INDEPENDENT_AMBULATORY_CARE_PROVIDER_SITE_OTHER): Payer: Medicare Other | Admitting: *Deleted

## 2015-09-08 DIAGNOSIS — Z7901 Long term (current) use of anticoagulants: Secondary | ICD-10-CM | POA: Diagnosis not present

## 2015-09-08 DIAGNOSIS — I4891 Unspecified atrial fibrillation: Secondary | ICD-10-CM

## 2015-09-08 LAB — POCT INR: INR: 2.2

## 2015-09-09 DIAGNOSIS — Z23 Encounter for immunization: Secondary | ICD-10-CM | POA: Diagnosis not present

## 2015-09-10 ENCOUNTER — Other Ambulatory Visit: Payer: Self-pay | Admitting: Cardiovascular Disease

## 2015-09-22 DIAGNOSIS — Z961 Presence of intraocular lens: Secondary | ICD-10-CM | POA: Diagnosis not present

## 2015-09-26 ENCOUNTER — Other Ambulatory Visit: Payer: Self-pay | Admitting: Cardiovascular Disease

## 2015-10-02 NOTE — Progress Notes (Signed)
Patient ID: [name], female   DOB: 05/24/1929, 81 y.o.   MRN: [id number]  [name] is seen today for f/U of afib, HTN, and edema. Her coumadin has been Rx. No palpitations, SOB or SSCP. She is walking on a regular basis without symptoms. She retired from [employer] 2 years ago . Her husband [name] died and she is now living in apartment near [place] has worked well as a diuretic for edema as she requires much less K. 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 Started back doing water aerobics at the [facility]   I care for her sister [name] who has had issues with edema and memory   Mild AS  Echo reviewed from 09/2014  Mean gradient 11 mmHG     She is working 20 hrs/week at [employer]  Had right retinal detachment  Rx by Dr Zadie Rhine    ROS: Denies fever, malais, weight loss, blurry vision, decreased visual acuity, cough, sputum, SOB, hemoptysis, pleuritic pain, palpitaitons, heartburn, abdominal pain, melena, lower extremity edema, claudication, or rash.  All other systems reviewed and negative  General: Affect appropriate Healthy:  appears stated age 81: normal Neck supple with no adenopathy JVP normal no bruits no thyromegaly Lungs clear with no wheezing and good diaphragmatic motion Heart:  S1/S2 AS  murmur, no rub, gallop or click PMI normal Abdomen: benighn, BS positve, no tenderness, no AAA no bruit.  No HSM or HJR Distal pulses intact with no bruits No edema Left foot in boot with healing ulcer  Skin warm and dry No muscular weakness   Current Outpatient Prescriptions  Medication Sig Dispense Refill  . ALPRAZolam (XANAX) 0.25 MG tablet Take 1 tablet (0.25 mg total) by mouth at bedtime as needed for sleep. 30 tablet 5  . atorvastatin (LIPITOR) 20 MG tablet Take 20 mg by mouth daily.    . Calcium  Carbonate-Vitamin D (CALTRATE 600+D) 600-400 MG-UNIT per tablet Take 1 tablet by mouth daily.      . carvedilol (COREG) 6.25 MG tablet TAKE 1 TABLET TWICE A DAY 180 tablet 0  . estradiol (CLIMARA - DOSED IN MG/24 HR) 0.1 mg/24hr Place 1 patch onto the skin once a week.      . furosemide (LASIX) 40 MG tablet Take 1 tablet (40 mg total) by mouth daily. 90 tablet 1  . ketorolac (ACULAR) 0.5 % ophthalmic solution Place 1 drop into the right eye 2 (two) times daily.    Marland Kitchen levothyroxine (SYNTHROID, LEVOTHROID) 125 MCG tablet Take 1 tablet (125 mcg total) by mouth daily. 90 tablet 4  . loperamide (IMODIUM A-D) 2 MG tablet Take 2 mg by mouth 4 (four) times daily as needed for diarrhea or loose stools.    . meclizine (ANTIVERT) 25 MG tablet TAKE 1/2 OR 1 TABLET BY MOUTH EVERY 6 HOURS AS NEEDED FOR DIZZINESS 30 tablet 5  . meloxicam (MOBIC) 7.5 MG tablet Take 1 tablet (7.5 mg total) by mouth daily. 90 tablet 1  . Multiple Vitamins-Minerals (CENTRUM SILVER PO) Take 1 tablet by mouth daily.      . potassium chloride SA (K-DUR,KLOR-CON) 20 MEQ tablet Take 1 tablet (20 mEq total) by mouth 2 (two) times daily. 180 tablet 3  . quinapril (ACCUPRIL) 20 MG tablet Take 1 tablet (20 mg total) by mouth daily. Huey  tablet 3  . spironolactone (ALDACTONE) 25 MG tablet Take 1 tablet (25 mg total) by mouth daily. 90 tablet 0  . vitamin B-12 (CYANOCOBALAMIN) 1000 MCG tablet Take 1,000 mcg by mouth daily.      Marland Kitchen warfarin (COUMADIN) 5 MG tablet TAKE AS DIRECTED BY ANTICOAGULATION CLINIC 120 tablet 1   No current facility-administered medications for this visit.    Allergies  Codeine  Electrocardiogram:  4/14  afib rate 64  Nonspecific ST/T wave changes  Today afib rate 63 nonspecific St changes similar to 2014  10/04/15  afib rate 62 nonspecific ST changes   Assessment and Plan Afib:  Chronic good rate control INR Rx no bleeding issues stable  Neuropathy:  F/u Regal ulcer healed done with course of antibiotics HTN:  Well  controlled.  Continue current medications and low sodium Dash type diet.   Chol:  On statin  Cholesterol is at goal.  Continue current dose of statin and diet Rx.  No myalgias or side effects.  F/U  LFT's in 6 months. Lab Results  Component Value Date   LDLCALC 69 03/11/2015   AS: mild on echo 10/05/14  Mean gradient 11 mmHg  No change in murmur consider repeating in a year or if change in symptoms         F/U with me in 6 months   Jenkins Rouge

## 2015-10-04 ENCOUNTER — Ambulatory Visit (INDEPENDENT_AMBULATORY_CARE_PROVIDER_SITE_OTHER): Payer: Medicare Other | Admitting: *Deleted

## 2015-10-04 ENCOUNTER — Ambulatory Visit (INDEPENDENT_AMBULATORY_CARE_PROVIDER_SITE_OTHER): Payer: Medicare Other | Admitting: Cardiovascular Disease

## 2015-10-04 ENCOUNTER — Encounter: Payer: Self-pay | Admitting: Cardiovascular Disease

## 2015-10-04 VITALS — BP 132/74 | HR 62 | Ht 71.0 in | Wt 180.0 lb

## 2015-10-04 DIAGNOSIS — Z7901 Long term (current) use of anticoagulants: Secondary | ICD-10-CM | POA: Diagnosis not present

## 2015-10-04 DIAGNOSIS — I482 Chronic atrial fibrillation, unspecified: Secondary | ICD-10-CM

## 2015-10-04 DIAGNOSIS — I4891 Unspecified atrial fibrillation: Secondary | ICD-10-CM

## 2015-10-04 LAB — POCT INR: INR: 2.5

## 2015-10-04 NOTE — Patient Instructions (Addendum)

## 2015-10-27 ENCOUNTER — Other Ambulatory Visit: Payer: Self-pay | Admitting: Cardiovascular Disease

## 2015-11-07 ENCOUNTER — Telehealth: Payer: Self-pay | Admitting: Family Medicine

## 2015-11-07 DIAGNOSIS — M79672 Pain in left foot: Secondary | ICD-10-CM | POA: Diagnosis not present

## 2015-11-07 NOTE — Telephone Encounter (Signed)
Dr. Chauncey Cruel from Dr. Rhona Raider office at [place] would like to get the approval of medium or low dose of Narotin. Just let him know if that's okay so he can let Mrs. Hibben know that it is okay for her to start taking that medication that he prescribed her.

## 2015-11-08 NOTE — Telephone Encounter (Signed)
Yes, tell him it is okay for her to take Neurontin

## 2015-11-08 NOTE — Telephone Encounter (Signed)
I spoke with pt and went over below information. She has the script and will get filled today.

## 2015-11-11 DIAGNOSIS — M25572 Pain in left ankle and joints of left foot: Secondary | ICD-10-CM | POA: Diagnosis not present

## 2015-11-14 DIAGNOSIS — M79672 Pain in left foot: Secondary | ICD-10-CM | POA: Diagnosis not present

## 2015-11-15 ENCOUNTER — Ambulatory Visit (INDEPENDENT_AMBULATORY_CARE_PROVIDER_SITE_OTHER): Payer: Medicare Other | Admitting: *Deleted

## 2015-11-15 DIAGNOSIS — Z7901 Long term (current) use of anticoagulants: Secondary | ICD-10-CM | POA: Diagnosis not present

## 2015-11-15 DIAGNOSIS — I4891 Unspecified atrial fibrillation: Secondary | ICD-10-CM

## 2015-11-15 LAB — POCT INR: INR: 1.1

## 2015-11-22 ENCOUNTER — Other Ambulatory Visit: Payer: Self-pay | Admitting: Cardiovascular Disease

## 2015-11-23 ENCOUNTER — Ambulatory Visit (INDEPENDENT_AMBULATORY_CARE_PROVIDER_SITE_OTHER): Payer: Medicare Other | Admitting: Pharmacist

## 2015-11-23 DIAGNOSIS — I4891 Unspecified atrial fibrillation: Secondary | ICD-10-CM

## 2015-11-23 DIAGNOSIS — Z7901 Long term (current) use of anticoagulants: Secondary | ICD-10-CM

## 2015-11-23 LAB — POCT INR: INR: 2.3

## 2015-11-24 ENCOUNTER — Ambulatory Visit (INDEPENDENT_AMBULATORY_CARE_PROVIDER_SITE_OTHER): Payer: Medicare Other | Admitting: Family Medicine

## 2015-11-24 ENCOUNTER — Encounter: Payer: Self-pay | Admitting: Family Medicine

## 2015-11-24 VITALS — BP 129/78 | HR 67 | Temp 98.3°F | Ht 71.0 in | Wt 184.0 lb

## 2015-11-24 DIAGNOSIS — M14672 Charcot's joint, left ankle and foot: Secondary | ICD-10-CM

## 2015-11-24 NOTE — Progress Notes (Signed)
Pre visit review using our clinic review tool, if applicable. No additional management support is needed unless otherwise documented below in the visit note. 

## 2015-11-24 NOTE — Progress Notes (Signed)
   Subjective:    Patient ID: [name], female    DOB: Mar 20, 1929, 81 y.o.   MRN: [id number]  HPI Here to follow up on swelling and pain in the left foot. She has been seeing Dr. Rhona Raider for this and a recent MRI of the foot revealed severe arthritic changes around the metatarsal-tarsal joints consistent with a Charcot foot. He has started her on Gabapentin, and she is tapering up to a dose of 300 mg TID. She uses Tylenol as needed for pain. We have discussed whether this could represent some type of inflammatory arthritis, and the fact that she responds so well and so quickly to prednisone does tend to support this. She has had negative tests for RA and for gout earlier this year, and a recent ESR was normal.    Review of Systems  Constitutional: Negative.   Respiratory: Negative.   Cardiovascular: Negative.   Musculoskeletal: Positive for joint swelling and arthralgias.       Objective:   Physical Exam  Constitutional: She appears well-developed and well-nourished.  Musculoskeletal:  The left lower leg and foot are swollen, red, warm, and she is tender around both ankles. ROM is intact           Assessment & Plan:  Charcot foot. We will refer her to Rheumatology to assess whether any other process is involved with her foot.

## 2015-11-25 ENCOUNTER — Other Ambulatory Visit: Payer: Self-pay | Admitting: Cardiovascular Disease

## 2015-11-28 DIAGNOSIS — Z79899 Other long term (current) drug therapy: Secondary | ICD-10-CM | POA: Diagnosis not present

## 2015-11-28 DIAGNOSIS — L03116 Cellulitis of left lower limb: Secondary | ICD-10-CM | POA: Diagnosis not present

## 2015-11-28 DIAGNOSIS — R799 Abnormal finding of blood chemistry, unspecified: Secondary | ICD-10-CM | POA: Diagnosis not present

## 2015-11-28 DIAGNOSIS — M25475 Effusion, left foot: Secondary | ICD-10-CM | POA: Diagnosis not present

## 2015-11-28 DIAGNOSIS — A5216 Charcot's arthropathy (tabetic): Secondary | ICD-10-CM | POA: Diagnosis not present

## 2015-11-30 ENCOUNTER — Other Ambulatory Visit: Payer: Self-pay | Admitting: Family Medicine

## 2015-11-30 ENCOUNTER — Telehealth: Payer: Self-pay | Admitting: Pharmacist

## 2015-11-30 NOTE — Telephone Encounter (Signed)
Returned patient call and she states she started prednisone yesterday. She also was started on cipro for 10 days yesterday. The prednisone dose will be 3x10mg  daily for 1 week, then 2x10mg  daily for 1 week, then 10mg  daily indefinitely (which will be replacing the mobic). I rescheduled the patients INR check for Friday and removed the mobic from her medication list. She states she understands and will come on Friday for the INR check.

## 2015-12-02 ENCOUNTER — Ambulatory Visit (INDEPENDENT_AMBULATORY_CARE_PROVIDER_SITE_OTHER): Payer: Medicare Other

## 2015-12-02 DIAGNOSIS — Z7901 Long term (current) use of anticoagulants: Secondary | ICD-10-CM | POA: Diagnosis not present

## 2015-12-02 DIAGNOSIS — I4891 Unspecified atrial fibrillation: Secondary | ICD-10-CM | POA: Diagnosis not present

## 2015-12-02 LAB — POCT INR: INR: 2.7

## 2015-12-09 ENCOUNTER — Ambulatory Visit (INDEPENDENT_AMBULATORY_CARE_PROVIDER_SITE_OTHER): Payer: Medicare Other | Admitting: *Deleted

## 2015-12-09 DIAGNOSIS — I4891 Unspecified atrial fibrillation: Secondary | ICD-10-CM

## 2015-12-09 DIAGNOSIS — Z7901 Long term (current) use of anticoagulants: Secondary | ICD-10-CM | POA: Diagnosis not present

## 2015-12-09 LAB — POCT INR: INR: 2.8

## 2015-12-14 DIAGNOSIS — Z79899 Other long term (current) drug therapy: Secondary | ICD-10-CM | POA: Diagnosis not present

## 2015-12-14 DIAGNOSIS — L03116 Cellulitis of left lower limb: Secondary | ICD-10-CM | POA: Diagnosis not present

## 2015-12-14 DIAGNOSIS — M25475 Effusion, left foot: Secondary | ICD-10-CM | POA: Diagnosis not present

## 2015-12-15 ENCOUNTER — Emergency Department (HOSPITAL_BASED_OUTPATIENT_CLINIC_OR_DEPARTMENT_OTHER): Payer: Medicare Other

## 2015-12-15 ENCOUNTER — Inpatient Hospital Stay (HOSPITAL_BASED_OUTPATIENT_CLINIC_OR_DEPARTMENT_OTHER)
Admission: EM | Admit: 2015-12-15 | Discharge: 2015-12-18 | DRG: 392 | Disposition: A | Discharge status: Home / Self Care | Payer: Medicare Other | Attending: Internal Medicine | Admitting: Internal Medicine

## 2015-12-15 ENCOUNTER — Encounter (HOSPITAL_BASED_OUTPATIENT_CLINIC_OR_DEPARTMENT_OTHER): Payer: Self-pay

## 2015-12-15 ENCOUNTER — Inpatient Hospital Stay (HOSPITAL_COMMUNITY): Payer: Medicare Other

## 2015-12-15 DIAGNOSIS — A419 Sepsis, unspecified organism: Secondary | ICD-10-CM | POA: Diagnosis not present

## 2015-12-15 DIAGNOSIS — I480 Paroxysmal atrial fibrillation: Secondary | ICD-10-CM | POA: Diagnosis not present

## 2015-12-15 DIAGNOSIS — G629 Polyneuropathy, unspecified: Secondary | ICD-10-CM | POA: Diagnosis present

## 2015-12-15 DIAGNOSIS — M84475A Pathological fracture, left foot, initial encounter for fracture: Secondary | ICD-10-CM | POA: Diagnosis present

## 2015-12-15 DIAGNOSIS — E039 Hypothyroidism, unspecified: Secondary | ICD-10-CM | POA: Diagnosis present

## 2015-12-15 DIAGNOSIS — Z823 Family history of stroke: Secondary | ICD-10-CM

## 2015-12-15 DIAGNOSIS — I739 Peripheral vascular disease, unspecified: Secondary | ICD-10-CM | POA: Diagnosis present

## 2015-12-15 DIAGNOSIS — I350 Nonrheumatic aortic (valve) stenosis: Secondary | ICD-10-CM

## 2015-12-15 DIAGNOSIS — I4891 Unspecified atrial fibrillation: Secondary | ICD-10-CM | POA: Diagnosis not present

## 2015-12-15 DIAGNOSIS — A084 Viral intestinal infection, unspecified: Secondary | ICD-10-CM | POA: Diagnosis present

## 2015-12-15 DIAGNOSIS — E46 Unspecified protein-calorie malnutrition: Secondary | ICD-10-CM | POA: Diagnosis present

## 2015-12-15 DIAGNOSIS — N390 Urinary tract infection, site not specified: Secondary | ICD-10-CM | POA: Diagnosis present

## 2015-12-15 DIAGNOSIS — Z85828 Personal history of other malignant neoplasm of skin: Secondary | ICD-10-CM | POA: Diagnosis not present

## 2015-12-15 DIAGNOSIS — M810 Age-related osteoporosis without current pathological fracture: Secondary | ICD-10-CM | POA: Diagnosis present

## 2015-12-15 DIAGNOSIS — Z66 Do not resuscitate: Secondary | ICD-10-CM | POA: Diagnosis present

## 2015-12-15 DIAGNOSIS — I080 Rheumatic disorders of both mitral and aortic valves: Secondary | ICD-10-CM | POA: Diagnosis present

## 2015-12-15 DIAGNOSIS — I340 Nonrheumatic mitral (valve) insufficiency: Secondary | ICD-10-CM | POA: Diagnosis present

## 2015-12-15 DIAGNOSIS — M1990 Unspecified osteoarthritis, unspecified site: Secondary | ICD-10-CM | POA: Diagnosis present

## 2015-12-15 DIAGNOSIS — I10 Essential (primary) hypertension: Secondary | ICD-10-CM

## 2015-12-15 DIAGNOSIS — Z8542 Personal history of malignant neoplasm of other parts of uterus: Secondary | ICD-10-CM | POA: Diagnosis not present

## 2015-12-15 DIAGNOSIS — E538 Deficiency of other specified B group vitamins: Secondary | ICD-10-CM | POA: Diagnosis present

## 2015-12-15 DIAGNOSIS — S92252A Displaced fracture of navicular [scaphoid] of left foot, initial encounter for closed fracture: Secondary | ICD-10-CM | POA: Diagnosis not present

## 2015-12-15 DIAGNOSIS — R109 Unspecified abdominal pain: Secondary | ICD-10-CM | POA: Diagnosis not present

## 2015-12-15 DIAGNOSIS — Z86010 Personal history of colonic polyps: Secondary | ICD-10-CM

## 2015-12-15 DIAGNOSIS — E785 Hyperlipidemia, unspecified: Secondary | ICD-10-CM | POA: Diagnosis present

## 2015-12-15 DIAGNOSIS — M2540 Effusion, unspecified joint: Secondary | ICD-10-CM

## 2015-12-15 DIAGNOSIS — M14672 Charcot's joint, left ankle and foot: Secondary | ICD-10-CM | POA: Diagnosis not present

## 2015-12-15 DIAGNOSIS — B952 Enterococcus as the cause of diseases classified elsewhere: Secondary | ICD-10-CM | POA: Diagnosis present

## 2015-12-15 DIAGNOSIS — J449 Chronic obstructive pulmonary disease, unspecified: Secondary | ICD-10-CM | POA: Diagnosis present

## 2015-12-15 DIAGNOSIS — M79675 Pain in left toe(s): Secondary | ICD-10-CM

## 2015-12-15 DIAGNOSIS — R112 Nausea with vomiting, unspecified: Secondary | ICD-10-CM

## 2015-12-15 DIAGNOSIS — E860 Dehydration: Secondary | ICD-10-CM | POA: Diagnosis not present

## 2015-12-15 DIAGNOSIS — K5730 Diverticulosis of large intestine without perforation or abscess without bleeding: Secondary | ICD-10-CM | POA: Diagnosis not present

## 2015-12-15 DIAGNOSIS — Z7901 Long term (current) use of anticoagulants: Secondary | ICD-10-CM

## 2015-12-15 DIAGNOSIS — Z8249 Family history of ischemic heart disease and other diseases of the circulatory system: Secondary | ICD-10-CM

## 2015-12-15 DIAGNOSIS — R197 Diarrhea, unspecified: Secondary | ICD-10-CM

## 2015-12-15 DIAGNOSIS — M7989 Other specified soft tissue disorders: Secondary | ICD-10-CM

## 2015-12-15 DIAGNOSIS — K529 Noninfective gastroenteritis and colitis, unspecified: Secondary | ICD-10-CM | POA: Diagnosis present

## 2015-12-15 DIAGNOSIS — I959 Hypotension, unspecified: Secondary | ICD-10-CM | POA: Diagnosis present

## 2015-12-15 DIAGNOSIS — Z85038 Personal history of other malignant neoplasm of large intestine: Secondary | ICD-10-CM | POA: Diagnosis not present

## 2015-12-15 LAB — PROTIME-INR
INR: 2.28 — ABNORMAL HIGH (ref 0.00–1.49)
INR: 2.86 — ABNORMAL HIGH (ref 0.00–1.49)
PROTHROMBIN TIME: 24.9 s — AB (ref 11.6–15.2)
Prothrombin Time: 29.5 seconds — ABNORMAL HIGH (ref 11.6–15.2)

## 2015-12-15 LAB — URINALYSIS, ROUTINE W REFLEX MICROSCOPIC
Bilirubin Urine: NEGATIVE
GLUCOSE, UA: NEGATIVE mg/dL
HGB URINE DIPSTICK: NEGATIVE
Ketones, ur: NEGATIVE mg/dL
Leukocytes, UA: NEGATIVE
Nitrite: NEGATIVE
PH: 5.5 (ref 5.0–8.0)
Protein, ur: NEGATIVE mg/dL
SPECIFIC GRAVITY, URINE: 1.01 (ref 1.005–1.030)

## 2015-12-15 LAB — BASIC METABOLIC PANEL
Anion gap: 7 (ref 5–15)
BUN: 22 mg/dL — AB (ref 6–20)
CO2: 25 mmol/L (ref 22–32)
Calcium: 8.2 mg/dL — ABNORMAL LOW (ref 8.9–10.3)
Chloride: 108 mmol/L (ref 101–111)
Creatinine, Ser: 0.95 mg/dL (ref 0.44–1.00)
GFR, EST NON AFRICAN AMERICAN: 53 mL/min — AB (ref >60)
Glucose, Bld: 187 mg/dL — ABNORMAL HIGH (ref 65–99)
POTASSIUM: 4.7 mmol/L (ref 3.5–5.1)
SODIUM: 140 mmol/L (ref 135–145)

## 2015-12-15 LAB — CBC WITH DIFFERENTIAL/PLATELET
BASOS ABS: 0 10*3/uL (ref 0.0–0.1)
BASOS PCT: 0 %
BASOS PCT: 0 %
Basophils Absolute: 0 10*3/uL (ref 0.0–0.1)
EOS ABS: 0 10*3/uL (ref 0.0–0.7)
EOS ABS: 0.1 10*3/uL (ref 0.0–0.7)
EOS PCT: 0 %
EOS PCT: 1 %
HCT: 47.4 % — ABNORMAL HIGH (ref 36.0–46.0)
HCT: 39.2 % (ref 36.0–46.0)
HEMOGLOBIN: 15.6 g/dL — AB (ref 12.0–15.0)
Hemoglobin: 12.5 g/dL (ref 12.0–15.0)
LYMPHS ABS: 1 10*3/uL (ref 0.7–4.0)
LYMPHS PCT: 4 %
Lymphocytes Relative: 6 %
Lymphs Abs: 1.3 10*3/uL (ref 0.7–4.0)
MCH: 31 pg (ref 26.0–34.0)
MCH: 30.9 pg (ref 26.0–34.0)
MCHC: 32.9 g/dL (ref 30.0–36.0)
MCHC: 31.9 g/dL (ref 30.0–36.0)
MCV: 94 fL (ref 78.0–100.0)
MCV: 96.8 fL (ref 78.0–100.0)
MONO ABS: 0.9 10*3/uL (ref 0.1–1.0)
Monocytes Absolute: 1.9 10*3/uL — ABNORMAL HIGH (ref 0.1–1.0)
Monocytes Relative: 3 %
Monocytes Relative: 12 %
NEUTROS PCT: 93 %
NEUTROS PCT: 81 %
Neutro Abs: 29.3 10*3/uL — ABNORMAL HIGH (ref 1.7–7.7)
Neutro Abs: 13.3 10*3/uL — ABNORMAL HIGH (ref 1.7–7.7)
PLATELETS: 307 10*3/uL (ref 150–400)
PLATELETS: 230 10*3/uL (ref 150–400)
RBC: 5.04 MIL/uL (ref 3.87–5.11)
RBC: 4.05 MIL/uL (ref 3.87–5.11)
RDW: 13.1 % (ref 11.5–15.5)
RDW: 13.2 % (ref 11.5–15.5)
WBC: 31.5 10*3/uL — AB (ref 4.0–10.5)
WBC: 16.4 10*3/uL — AB (ref 4.0–10.5)

## 2015-12-15 LAB — COMPREHENSIVE METABOLIC PANEL
ALK PHOS: 100 U/L (ref 38–126)
ALT: 26 U/L (ref 14–54)
AST: 28 U/L (ref 15–41)
Albumin: 4.8 g/dL (ref 3.5–5.0)
Anion gap: 13 (ref 5–15)
BUN: 30 mg/dL — AB (ref 6–20)
CALCIUM: 10.1 mg/dL (ref 8.9–10.3)
CHLORIDE: 97 mmol/L — AB (ref 101–111)
CO2: 26 mmol/L (ref 22–32)
CREATININE: 0.86 mg/dL (ref 0.44–1.00)
GFR calc Af Amer: >60 mL/min (ref >60)
GFR calc non Af Amer: >60 mL/min (ref >60)
GLUCOSE: 199 mg/dL — AB (ref 65–99)
Potassium: 4.1 mmol/L (ref 3.5–5.1)
SODIUM: 136 mmol/L (ref 135–145)
Total Bilirubin: 1.5 mg/dL — ABNORMAL HIGH (ref 0.3–1.2)
Total Protein: 8 g/dL (ref 6.5–8.1)

## 2015-12-15 LAB — LACTIC ACID, PLASMA
LACTIC ACID, VENOUS: 3 mmol/L — AB (ref 0.5–2.0)
LACTIC ACID, VENOUS: 2.9 mmol/L — AB (ref 0.5–2.0)

## 2015-12-15 LAB — C-REACTIVE PROTEIN: CRP: 4.1 mg/dL — ABNORMAL HIGH (ref <1.0)

## 2015-12-15 LAB — TROPONIN I: Troponin I: <0.03 ng/mL (ref <0.031)

## 2015-12-15 LAB — I-STAT CG4 LACTIC ACID, ED
LACTIC ACID, VENOUS: 2.86 mmol/L — AB (ref 0.5–2.0)
Lactic Acid, Venous: 4.85 mmol/L (ref 0.5–2.0)

## 2015-12-15 LAB — URIC ACID: Uric Acid, Serum: 5.2 mg/dL (ref 2.3–6.6)

## 2015-12-15 LAB — MRSA PCR SCREENING: MRSA by PCR: NEGATIVE

## 2015-12-15 LAB — PROCALCITONIN: PROCALCITONIN: 3.79 ng/mL

## 2015-12-15 LAB — SEDIMENTATION RATE: SED RATE: 7 mm/h (ref 0–22)

## 2015-12-15 MED ORDER — HYDROCODONE-ACETAMINOPHEN 5-325 MG PO TABS
1.0000 | ORAL_TABLET | Freq: Four times a day (QID) | ORAL | Status: DC | PRN
  Filled 2015-12-15: qty 1

## 2015-12-15 MED ORDER — SODIUM CHLORIDE 0.9 % IV BOLUS (SEPSIS)
500.0000 mL | Freq: Once | INTRAVENOUS | Status: AC
  Administered 2015-12-15: 500 mL via INTRAVENOUS

## 2015-12-15 MED ORDER — ENOXAPARIN SODIUM 40 MG/0.4ML ~~LOC~~ SOLN: 40.0000 mg | Freq: Every day | SUBCUTANEOUS | Status: DC

## 2015-12-15 MED ORDER — VANCOMYCIN 50 MG/ML ORAL SOLUTION
125.0000 mg | Freq: Four times a day (QID) | ORAL | Status: DC
  Administered 2015-12-15 (×4): 125 mg via ORAL
  Filled 2015-12-15 (×9): qty 2.5

## 2015-12-15 MED ORDER — PANTOPRAZOLE SODIUM 40 MG IV SOLR
40.0000 mg | Freq: Once | INTRAVENOUS | Status: AC
  Administered 2015-12-15: 40 mg via INTRAVENOUS
  Filled 2015-12-15: qty 40

## 2015-12-15 MED ORDER — SODIUM CHLORIDE 0.9 % IV SOLN
INTRAVENOUS | Status: DC
  Administered 2015-12-16 (×2): via INTRAVENOUS

## 2015-12-15 MED ORDER — MORPHINE SULFATE (PF) 2 MG/ML IV SOLN: 1.0000 mg | INTRAVENOUS | Status: DC | PRN

## 2015-12-15 MED ORDER — DILTIAZEM LOAD VIA INFUSION
10.0000 mg | Freq: Once | INTRAVENOUS | Status: DC
  Filled 2015-12-15: qty 10

## 2015-12-15 MED ORDER — ACETAMINOPHEN 325 MG PO TABS
650.0000 mg | ORAL_TABLET | Freq: Four times a day (QID) | ORAL | Status: DC | PRN
  Administered 2015-12-16 – 2015-12-17 (×5): 650 mg via ORAL
  Filled 2015-12-15 (×5): qty 2

## 2015-12-15 MED ORDER — WARFARIN SODIUM 5 MG PO TABS
5.0000 mg | ORAL_TABLET | Freq: Once | ORAL | Status: AC
  Administered 2015-12-15: 5 mg via ORAL
  Filled 2015-12-15: qty 1

## 2015-12-15 MED ORDER — SODIUM CHLORIDE 0.9 % IV BOLUS (SEPSIS): 1000.0000 mL | Freq: Once | INTRAVENOUS | Status: DC

## 2015-12-15 MED ORDER — DILTIAZEM HCL 100 MG IV SOLR
5.0000 mg/h | INTRAVENOUS | Status: DC
  Filled 2015-12-15: qty 100

## 2015-12-15 MED ORDER — ACETAMINOPHEN 650 MG RE SUPP: 650.0000 mg | Freq: Four times a day (QID) | RECTAL | Status: DC | PRN

## 2015-12-15 MED ORDER — SODIUM CHLORIDE 0.9 % IV BOLUS (SEPSIS)
1000.0000 mL | Freq: Once | INTRAVENOUS | Status: AC
  Administered 2015-12-15: 1000 mL via INTRAVENOUS

## 2015-12-15 MED ORDER — ONDANSETRON HCL 4 MG/2ML IJ SOLN
INTRAMUSCULAR | Status: AC
  Administered 2015-12-15: 4 mg via INTRAVENOUS
  Filled 2015-12-15: qty 2

## 2015-12-15 MED ORDER — IOHEXOL 300 MG/ML  SOLN
100.0000 mL | Freq: Once | INTRAMUSCULAR | Status: AC | PRN
  Administered 2015-12-15: 100 mL via INTRAVENOUS

## 2015-12-15 MED ORDER — ALPRAZOLAM 0.25 MG PO TABS: 0.2500 mg | ORAL_TABLET | Freq: Every evening | ORAL | Status: DC | PRN

## 2015-12-15 MED ORDER — WARFARIN - PHARMACIST DOSING INPATIENT: Freq: Every day | Status: DC

## 2015-12-15 MED ORDER — KETOROLAC TROMETHAMINE 0.5 % OP SOLN
1.0000 [drp] | Freq: Two times a day (BID) | OPHTHALMIC | Status: DC
  Administered 2015-12-15 – 2015-12-18 (×6): 1 [drp] via OPHTHALMIC
  Filled 2015-12-15: qty 3

## 2015-12-15 MED ORDER — METRONIDAZOLE IN NACL 5-0.79 MG/ML-% IV SOLN
500.0000 mg | Freq: Three times a day (TID) | INTRAVENOUS | Status: DC
  Administered 2015-12-15 – 2015-12-17 (×6): 500 mg via INTRAVENOUS
  Filled 2015-12-15 (×6): qty 100

## 2015-12-15 MED ORDER — METRONIDAZOLE IN NACL 5-0.79 MG/ML-% IV SOLN
500.0000 mg | Freq: Once | INTRAVENOUS | Status: AC
  Administered 2015-12-15: 500 mg via INTRAVENOUS
  Filled 2015-12-15: qty 100

## 2015-12-15 MED ORDER — LEVOTHYROXINE SODIUM 25 MCG PO TABS
125.0000 ug | ORAL_TABLET | Freq: Every day | ORAL | Status: DC
  Administered 2015-12-16 – 2015-12-18 (×3): 125 ug via ORAL
  Filled 2015-12-15 (×3): qty 1

## 2015-12-15 MED ORDER — SODIUM CHLORIDE 0.9 % IJ SOLN
3.0000 mL | Freq: Two times a day (BID) | INTRAMUSCULAR | Status: DC
  Administered 2015-12-15 – 2015-12-18 (×4): 3 mL via INTRAVENOUS

## 2015-12-15 MED ORDER — ONDANSETRON HCL 4 MG/2ML IJ SOLN
4.0000 mg | Freq: Four times a day (QID) | INTRAMUSCULAR | Status: DC
  Administered 2015-12-15 – 2015-12-17 (×10): 4 mg via INTRAVENOUS
  Filled 2015-12-15 (×12): qty 2

## 2015-12-15 MED ORDER — METOPROLOL TARTRATE 1 MG/ML IV SOLN
5.0000 mg | INTRAVENOUS | Status: DC
  Administered 2015-12-15 (×3): 5 mg via INTRAVENOUS
  Administered 2015-12-15: 2.5 mg via INTRAVENOUS
  Administered 2015-12-16: 5 mg via INTRAVENOUS
  Administered 2015-12-16: 2.5 mg via INTRAVENOUS
  Administered 2015-12-16: 5 mg via INTRAVENOUS
  Filled 2015-12-15 (×8): qty 5

## 2015-12-15 MED ORDER — METRONIDAZOLE 500 MG PO TABS: 500.0000 mg | ORAL_TABLET | Freq: Four times a day (QID) | ORAL | Status: DC

## 2015-12-15 MED ORDER — GABAPENTIN 300 MG PO CAPS
300.0000 mg | ORAL_CAPSULE | Freq: Every day | ORAL | Status: DC
  Administered 2015-12-15 – 2015-12-17 (×3): 300 mg via ORAL
  Filled 2015-12-15 (×3): qty 1

## 2015-12-15 NOTE — ED Notes (Signed)
Report to [facility] at bedside, pt care transferred to [facility] [place] staff. Pt is awake alert, smiling, denies any c/o.

## 2015-12-15 NOTE — Progress Notes (Signed)
ANTICOAGULATION CONSULT NOTE - Initial Consult  Pharmacy Consult for Coumadin Indication: atrial fibrillation  Allergies  Allergen Reactions  . Codeine     REACTION: nausea   Patient Measurements: Height: 5\' 10"  (177.8 cm) Weight: 176 lb 12.9 oz (80.2 kg) IBW/kg (Calculated) : 68.5 Vital Signs: Temp: 98.9 F (37.2 C) (01/19 1100) Temp Source: Oral (01/19 1100) BP: 124/76 mmHg (01/19 1002) Pulse Rate: 114 (01/19 0900) Labs:  Recent Labs  12/15/15 0350 12/15/15 1105  HGB 15.6* 12.5  HCT 47.4* 39.2  PLT 307 230  LABPROT 24.9*  --   INR 2.28*  --   CREATININE 0.86 0.95  TROPONINI <0.03  --    Estimated Creatinine Clearance: 47.7 mL/min (by C-G formula based on Cr of 0.95).  Assessment: 82 year old female on chronic Coumadin for atrial fibrillation who was admitted with for r/o sepsis- Cdiff receiving IV Flagyl and po Vancomycin. INR on admission is therapeutic at 2.28. Will monitor INR closely with drug interaction with Flagyl. PTA dose was 5mg  po daily.   Goal of Therapy:  INR 2-3 Monitor platelets by anticoagulation protocol: Yes   Plan:  Coumadin 5mg  po x1 tonight.  Monitor PT/INR daily - watch closely with DDI with Flagyl.   Sloan Leiter, PharmD, BCPS Clinical Pharmacist [phone] 12/15/2015,1:35 PM

## 2015-12-15 NOTE — Progress Notes (Addendum)
Improving after treatment: BUN down to 22 from 30, WBC down significantly from 31,500 to 16,400. Lactic acid has bumped back up to 3.0 so will increase IVFs from 50 to 100/hr. PCT 3.79  344 pm: Repeat lactic acid still up at 2.9  Erin Hearing, ANP

## 2015-12-15 NOTE — H&P (Signed)
 [facility] History and Physical                                                                                    [name], is a 82 y.o. female  MRN: [id number]   DOB - 03/21/1929  Admit Date - 12/15/2015  Outpatient Primary MD for the patient is FRY,STEPHEN A, MD  Referring MD: [place] / ER  PMH: Past Medical History  Diagnosis Date  . Hypertension   . Chest pain, unspecified   . Atrial fibrillation (HCC)   . Mitral valve disorder   . Patent foramen ovale   . Peripheral vascular disease (HCC)   . Venous insufficiency   . Hyperlipidemia   . Impaired glucose tolerance   . Hypothyroid   . Diverticulosis of colon   . History of colonic polyps   . History of colon cancer   . Splenic infarction   . History of uterine cancer   . UTI (lower urinary tract infection)   . Osteoarthritis   . Chronic low back pain   . Osteoporosis   . Peripheral neuropathy (HCC)   . Anxiety   . Vitamin B12 deficiency   . History of colonoscopy   . Cataract     REMOVED BILATERAL      PSH: Past Surgical History  Procedure Laterality Date  . Hysterectomy for uterine cancer    . Anterior and posterior vaginal repair  03/2006    Dr.Neal  . Basal cell skin cancer moh's surgery    . Right hemicolectomy for colon cancer  1994  . Lumbar laminectomy for spinal stenosis  2006    L3-4  . Left carpal tunnel surgery  02/2007    Dr. Sypher  . Retinal detachment repair w/ scleral buckle le Right 03-01-14    per Dr. Rankin   . Colonoscopy  05-25-10    per Dr. Perry, benign polyps, repeat in 5 yrs   . Eye surgery    . Cataract extraction, bilateral       CC:  Chief Complaint  Patient presents with  . Emesis     HPI: 82-year-old female patient with history of moderate to severe MR, moderate aortic stenosis, hypertension, hypothyroidism, atrial fibrillation on warfarin, peripheral vascular disease with left Charcot foot, recurrent redness and pain left foot etiology unclear,  hyperlipidemia, diverticulosis who presented to [facility] [place] nausea and vomiting. Patient reports that for the past 1 week she's had generalized malaise weakness and shakiness. On 1/18 midday patient began having waxing and waning upper abdominal pain. She kept her usual appointment with her podiatrist. Unfortunately after supper/by bedtime patient developed significant nausea and vomiting about 2:30 AM symptoms are significant enough she proceeded to the ER for treatment. After arrival she began having intractable watery diarrhea occasionally associated with incontinence. She denied blood in the diarrhea. She's been having chills with rigors since onset of diarrhea. She denies sick contacts with similar symptoms, she denies reflux symptoms noting she's been on multiple courses of prednisone for her foot problem for the past year. About 2 and half weeks ago she was given another course of Cipro for presumed cellulitis of   left foot. Of note patient is very concerned about her left foot since she is having significant intractable pain. She has previously been treated for cellulitis as well as gout. Recently she was seen by rheumatologist told her he did not think her symptoms were gout. Discussion was held about possible steroid injection to the foot but she had diffuse foot pain and a prime/optimal location for injection could not be determined that this was not given.  ER Evaluation and treatment: Afebrile, tachycardia with heart rates between 116 and 124 bpm, initially somewhat hypertensive with a BP of 135/99 with nadir blood pressure 94/62 at 7 AM. AAS: Stable cardiomegaly and COPD and a normal bowel gas pattern CT abdomen and pelvis with contrast: Enteritis with mild ileus, diverticulosis without diverticulitis Abnormal labs: Lactic acid 4.85 with repeat down to 2.86 after fluid challenges, WBCs 31,500 with absolute neutrophils 29.3% and neutrophils 93% toxic granulation, elevated hemoglobin 15.6  with baseline hemoglobin 13; INR therapeutic at 2.28; blood cultures have been obtained Total of 2 L normal saline boluses given Protonix 40 g IV 1 Flagyl 500 mg IV 1 Zofran 4 mg IV 1   Review of Systems   In addition to the HPI above,  No Fever No Headache, changes with Vision or hearing, new weakness, tingling, numbness in any extremity, No problems swallowing food or Liquids, indigestion/reflux No Chest pain, Cough or Shortness of Breath, palpitations, orthopnea or DOE No melena or hematochezia, no dark tarry stools No dysuria, hematuria or flank pain No new skin rashes, lesions, masses or bruises, No new joints pains-aches No recent weight gain or loss No polyuria, polydypsia or polyphagia,  *A full 10 point Review of Systems was done, except as stated above, all other Review of Systems were negative.  Social History Social History  Substance Use Topics  . Smoking status: Never Smoker   . Smokeless tobacco: Never Used  . Alcohol Use: 0.0 oz/week    0 Standard drinks or equivalent per week     Comment: occ    Resides at: Private residence  Lives with: Alone  Ambulatory status: Without assistive devices   Family History Family History  Problem Relation Age of Onset  . Cancer Mother     deceased age 68  . Heart failure Father     deceased age 92  . Cancer Brother     deceased age 61  . Stroke Sister     and heart problems; deceased age 72  . Heart disease Sister      Prior to Admission medications   Medication Sig Start Date End Date Taking? Authorizing Provider  ALPRAZolam (XANAX) 0.25 MG tablet Take 1 tablet (0.25 mg total) by mouth at bedtime as needed for sleep. 04/19/15  Yes Stephen A Fry, MD  atorvastatin (LIPITOR) 20 MG tablet Take 20 mg by mouth daily. 09/07/15  Yes Historical Provider, MD  Calcium Carbonate-Vitamin D (CALTRATE 600+D) 600-400 MG-UNIT per tablet Take 1 tablet by mouth daily.     Yes Historical Provider, MD  carvedilol (COREG) 6.25  MG tablet TAKE 1 TABLET TWICE A DAY 11/25/15  Yes Peter C Nishan, MD  furosemide (LASIX) 40 MG tablet Take 1 tablet (40 mg total) by mouth daily. 09/26/15  Yes Peter C Nishan, MD  gabapentin (NEURONTIN) 300 MG capsule Take 300 mg by mouth at bedtime.    Yes Historical Provider, MD  HYDROcodone-acetaminophen (NORCO/VICODIN) 5-325 MG tablet Take 1 tablet by mouth every 6 (six) hours as needed for moderate pain.    12/14/15  Yes Historical Provider, MD  ketorolac (ACULAR) 0.5 % ophthalmic solution Place 1 drop into the right eye 2 (two) times daily. 03/17/15  Yes Historical Provider, MD  levothyroxine (SYNTHROID, LEVOTHROID) 125 MCG tablet Take 1 tablet (125 mcg total) by mouth daily. 02/01/15  Yes Peter C Nishan, MD  loperamide (IMODIUM A-D) 2 MG tablet Take 2 mg by mouth 4 (four) times daily as needed for diarrhea or loose stools.   Yes Historical Provider, MD  meclizine (ANTIVERT) 25 MG tablet TAKE 1/2 OR 1 TABLET BY MOUTH EVERY 6 HOURS AS NEEDED FOR DIZZINESS 04/19/15  Yes Stephen A Fry, MD  Multiple Vitamins-Minerals (CENTRUM SILVER PO) Take 1 tablet by mouth daily.     Yes Historical Provider, MD  potassium chloride SA (K-DUR,KLOR-CON) 20 MEQ tablet Take 1 tablet (20 mEq total) by mouth 2 (two) times daily. 09/12/15  Yes Peter C Nishan, MD  quinapril (ACCUPRIL) 20 MG tablet Take 1 tablet (20 mg total) by mouth daily. 09/12/15  Yes Peter C Nishan, MD  spironolactone (ALDACTONE) 25 MG tablet TAKE 1 TABLET DAILY 11/22/15  Yes Peter C Nishan, MD  vitamin B-12 (CYANOCOBALAMIN) 1000 MCG tablet Take 1,000 mcg by mouth daily.     Yes Historical Provider, MD  warfarin (COUMADIN) 5 MG tablet TAKE AS DIRECTED BY ANTICOAGULATION CLINIC 10/27/15  Yes Peter C Nishan, MD  estradiol (CLIMARA - DOSED IN MG/24 HR) 0.1 mg/24hr Place 1 patch onto the skin once a week. [email or web address]    Historical Provider, MD    Allergies  Allergen Reactions  . Codeine     REACTION: nausea    Physical Exam  Vitals  Blood pressure 124/76,  pulse 114, temperature 98.6 F (37 C), temperature source Oral, resp. rate 20, height 5' 10" (1.778 m), weight 176 lb 12.9 oz (80.2 kg), SpO2 97 %.   General:  In mild acute distress as evidenced by ongoing but improved abdominal pain  Psych:  Normal affect, Denies Suicidal or Homicidal ideations, Awake Alert, Oriented X 3.   Neuro:   No focal neurological deficits, CN II through XII intact, Strength 5/5 all 4 extremities, Sensation intact all 4 extremities.  ENT:  Ears and Eyes appear Normal, Conjunctivae clear, PER. Moist oral mucosa without erythema or exudates.  Neck:  Supple, No lymphadenopathy appreciated  Respiratory:  Symmetrical chest wall movement, Good air movement bilaterally, CTAB. Room Air  Cardiac:  RRR, No Murmurs, no LE edema noted, no JVD, No carotid bruits, peripheral pulses palpable at 2+  Abdomen:  Positive hypoactive bowel sounds, Soft, minimally tender, Non distended,  No masses appreciated, no obvious hepatosplenomegaly  Skin:  No Cyanosis, Normal Skin Turgor, No Skin Rash or Bruise.  Extremities: Symmetrical without obvious trauma or injury,  no effusions. Patient has warm slightly reddened left foot with obvious Charcot changes in foot is diffusely tender with minimal palpation  Data Review  CBC  Recent Labs Lab 12/15/15 0350  WBC 31.5*  HGB 15.6*  HCT 47.4*  PLT 307  MCV 94.0  MCH 31.0  MCHC 32.9  RDW 13.1  LYMPHSABS 1.3  MONOABS 0.9  EOSABS 0.0  BASOSABS 0.0    Chemistries   Recent Labs Lab 12/15/15 0350  NA 136  K 4.1  CL 97*  CO2 26  GLUCOSE 199*  BUN 30*  CREATININE 0.86  CALCIUM 10.1  AST 28  ALT 26  ALKPHOS 100  BILITOT 1.5*    estimated creatinine clearance is 52.7 mL/min (by C-G formula based   on Cr of 0.86).  No results for input(s): TSH, T4TOTAL, T3FREE, THYROIDAB in the last 72 hours.  Invalid input(s): FREET3  Coagulation profile  Recent Labs Lab 12/09/15 1155 12/15/15 0350  INR 2.8 2.28*    No  results for input(s): DDIMER in the last 72 hours.  Cardiac Enzymes  Recent Labs Lab 12/15/15 0350  TROPONINI <0.03    Invalid input(s): POCBNP  Urinalysis    Component Value Date/Time   COLORURINE YELLOW 09/12/2014 1810   APPEARANCEUR CLEAR 09/12/2014 1810   LABSPEC 1.011 09/12/2014 1810   PHURINE 5.0 09/12/2014 1810   GLUCOSEU NEGATIVE 09/12/2014 1810   GLUCOSEU NEGATIVE 07/15/2008 1258   HGBUR SMALL* 09/12/2014 1810   BILIRUBINUR n 03/11/2015 1104   BILIRUBINUR NEGATIVE 09/12/2014 1810   KETONESUR NEGATIVE 09/12/2014 1810   PROTEINUR n 03/11/2015 1104   PROTEINUR NEGATIVE 09/12/2014 1810   UROBILINOGEN 0.2 03/11/2015 1104   UROBILINOGEN 0.2 09/12/2014 1810   NITRITE positive 03/11/2015 1104   NITRITE NEGATIVE 09/12/2014 1810   LEUKOCYTESUR small (1+) 03/11/2015 1104    Imaging results:   Ct Abdomen Pelvis W Contrast  12/15/2015  CLINICAL DATA:  82 year old female with diarrhea. History of colon and uterine cancer with partial resection of the colon. EXAM: CT ABDOMEN AND PELVIS WITH CONTRAST TECHNIQUE: Multidetector CT imaging of the abdomen and pelvis was performed using the standard protocol following bolus administration of intravenous contrast. CONTRAST:  124m OMNIPAQUE IOHEXOL 300 MG/ML  SOLN COMPARISON:  CT dated 07/23/2010 FINDINGS: The visualized lung bases are clear. Bibasilar linear atelectasis/scarring noted. There is dilatation of the right heart chambers as well as dilatation of the left atrium. Correlation with echocardiogram recommended. No intra-abdominal free air.  Trace free fluid within the pelvis. The liver, gallbladder, pancreas appear unremarkable. There is irregularity of the splenic contour likely related to chronic infarct. The adrenal glands appear unremarkable. There is moderate bilateral renal atrophy. No hydronephrosis. The visualized ureters and urinary bladder appear unremarkable. Hysterectomy. There is a 4 cm duodenal diverticulum. There is mild  thickening of the jejunal folds in the left hemiabdomen with haziness of the adjacent fat. Multiple normal caliber fluid-filled loops of small bowel noted within the abdomen and pelvis. Findings compatible with enteritis. There is abutment of loops of small bowel to the anterior peritoneal wall compatible with adhesions. There is postsurgical changes of partial colon resection with ileotransverse anastomosis. There is no evidence of bowel obstruction. There is sigmoid diverticulosis without active inflammatory changes. There is aortoiliac atherosclerotic disease. No portal venous gas identified. There is no adenopathy. Midline vertical anterior abdominal wall incisional scar. Degenerative changes of the spine. No acute fracture. IMPRESSION: Findings compatible with enteritis with possible mild ileus. No evidence of bowel obstruction. Sigmoid diverticulosis. Electronically Signed   By: AAnner CreteM.D.   On: 12/15/2015 07:01   Dg Abd Acute W/chest  12/15/2015  CLINICAL DATA:  Mid abdominal pain for 2 days, and diarrhea. Nausea. History of colon and uterine cancer. History of hypertension, atrial fibrillation. EXAM: DG ABDOMEN ACUTE W/ 1V CHEST COMPARISON:  Chest radiograph Apr 14, 2012 FINDINGS: Cardiac silhouette is moderately enlarged unchanged. Calcified aortic knob. Similar chronic interstitial changes increased lung volumes most consistent with COPD without pleural effusion or focal consolidation. No pneumothorax. Osteopenia. Soft tissue planes are nonsuspicious. Bowel gas pattern is nondilated and nonobstructive. Bowel anastomotic staples project in the RIGHT abdomen. Surgical clips in the pelvis. No intra-abdominal mass effect or pathologic calcifications. No free air. Osteopenia. IMPRESSION: Stable cardiomegaly and COPD.  No acute pulmonary process. Normal bowel gas pattern. Electronically Signed   By: Courtnay  Bloomer M.D.   On: 12/15/2015 05:55     EKG: (Independently reviewed) atrial  fibrillation with ventricular rate of 107 bpm, QTC 466 ms, downsloping ST segments in inferior lateral leads which is unchanged although slightly more pronounced than likely this is reflective of elevated heart rate   Assessment & Plan  Principal Problem:   Sepsis (evolving)  -SDU/Inpt -Etiology enteritis/presumed C. difficile colitis -PCT and cycle lactic acid (trend is down after fluid challenges) -tx underlying causes -careful volume resuscitation 2/2 h/o mod to severe MS w/ mod MR -WBC >14,000, initial lactic acid >4 and pt with tachycardia at presentation plus suspected source -hemodynamically stable w/ MAP >65 -bld cx's pending  Active Problems:   Dehydration/Enteritis -Recent antibiotics with Cipro -Presents with sepsis physiology and mild ileus therefore empiric PO vancomycin with adjunctive IV Flagyl -Check C. difficile PCR as well as GI pathogen panel-no recent sick contacts with similar symptoms -Gentle IV fluid hydration at 50/hr initially given valvular heart disease and risk for valvular related CHF; has received 2000 mL prior to arrival to this facility -Contact isolation    Essential hypertension -Current blood pressure suboptimal so we'll hold preadmission antihypertensives(carvedilol and quinapril) and diuretics (Aldactone and Lasix)    Moderate mitral regurgitation/moderate to severe mitral stenosis/aortic stenosis -seen on last Echo in 2015 -Monitor for heart failure exacerbation with volume resuscitation    ATRIAL FIBRILLATION -Presents with tachycardia which is appropriate physiologically based on sepsis and dehydration -We'll schedule Lopressor IV for rate control with hold parameters given suboptimal blood pressure -INR therapeutic at presentation; pharmacy to manage/dose    Hypothyroidism -Continue preadmission Synthroid    Hyperlipemia -Resume statin once solid diet tolerated     PERIPHERAL VASCULAR DISEASE with neuropathy /Charcot's joint of left  foot -Continue preadmission Neurontin -Patient has had recurrent issues with suspected cellulitis as well as gout and has received multiple courses of antibiotics and/or prednisone as an outpatient and recently completed a course of antibiotics about 2 weeks ago -Check MRI left foot -Check ESR, CRP, uric acid level as well -Patient reports recently evaluated by rheumatologist told her she did not have gout -Left foot slightly reddened and exquisitely painful but no overt signs of cellulitis follow-up on MRI    DVT Prophylaxis: warfarin   Family Communication: No family at bedside   Code Status:   DO NOT RESUSCITATE   Condition:   guarded   Discharge disposition: anticipate discharge back to home environment pending medical stability -may require PT/OT evaluation prior to discharge   Time spent in minutes : 60      ELLIS,ALLISON L. ANP on 12/15/2015 at 11:13 AM  You may contact me by going to [email or web address] - password [email or web address]  I am available from 7a-7p but please confirm I am on the schedule by going to Amion as above.   After 7p please contact night coverage person covering me after hours  [facility]  

## 2015-12-15 NOTE — ED Notes (Signed)
LAC results of 4.85 hand delivered to Dr. Randal Buba

## 2015-12-15 NOTE — Progress Notes (Signed)
Pt coming from [place] to have WBC 31.5, lactic acid 4.85 Possible recent ABX for chronic foot ulcer.  Afib w/ RVR CT abd w/ enteritis Emesis and diarrhea started at [facility]. Concern for Cdiff Admit to step down at [facility].

## 2015-12-15 NOTE — ED Provider Notes (Signed)
CSN: [id number]     Arrival date & time 12/15/15  0306 History   First MD Initiated Contact with Patient 12/15/15 [phone]     Chief Complaint  Patient presents with  . Emesis     (Consider location/radiation/quality/duration/timing/severity/associated sxs/prior Treatment) Patient is a 82 y.o. female presenting with vomiting. The history is provided by the patient and a relative.  Emesis Severity:  Moderate Timing:  Intermittent Quality:  Stomach contents Progression:  Unchanged Chronicity:  New Relieved by:  Nothing Worsened by:  Nothing tried Ineffective treatments:  None tried Associated symptoms: abdominal pain and diarrhea   Associated symptoms: no fever   Associated symptoms comment:  Diarrhea just started coming in to the exam room in the ED Risk factors: no sick contacts     Past Medical History  Diagnosis Date  . Hypertension   . Chest pain, unspecified   . Atrial fibrillation ([place])   . Mitral valve disorder   . Patent foramen ovale   . Peripheral vascular disease ([place])   . Venous insufficiency   . Hyperlipidemia   . Impaired glucose tolerance   . Hypothyroid   . Diverticulosis of colon   . History of colonic polyps   . History of colon cancer   . Splenic infarction   . History of uterine cancer   . UTI (lower urinary tract infection)   . Osteoarthritis   . Chronic low back pain   . Osteoporosis   . Peripheral neuropathy ([place])   . Anxiety   . Vitamin B12 deficiency   . History of colonoscopy   . Cataract     REMOVED BILATERAL   Past Surgical History  Procedure Laterality Date  . Hysterectomy for uterine cancer    . Anterior and posterior vaginal repair  03/2006    Dr.Neal  . Basal cell skin cancer moh's surgery    . Right hemicolectomy for colon cancer  1994  . Lumbar laminectomy for spinal stenosis  2006    L3-4  . Left carpal tunnel surgery  02/2007    Dr. Daylene Katayama  . Retinal detachment repair w/ scleral buckle le Right 03-01-14    per Dr. Zadie Rhine    . Colonoscopy  05-25-10    per Dr. Henrene Pastor, benign polyps, repeat in 5 yrs   . Eye surgery    . Cataract extraction, bilateral     Family History  Problem Relation Age of Onset  . Cancer Mother     deceased age 82  . Heart failure Father     deceased age 82  . Cancer Brother     deceased age 67  . Stroke Sister     and heart problems; deceased age 6  . Heart disease Sister    Social History  Substance Use Topics  . Smoking status: Never Smoker   . Smokeless tobacco: Never Used  . Alcohol Use: 0.0 oz/week    0 Standard drinks or equivalent per week     Comment: occ   OB History    No data available     Review of Systems  Constitutional: Negative for fever.  Respiratory: Negative for shortness of breath.   Cardiovascular: Negative for chest pain.  Gastrointestinal: Positive for vomiting, abdominal pain and diarrhea.  All other systems reviewed and are negative.     Allergies  Codeine  Home Medications   Prior to Admission medications   Medication Sig Start Date End Date Taking? Authorizing Provider  ALPRAZolam Duanne Moron) 0.25 MG tablet Take  1 tablet (0.25 mg total) by mouth at bedtime as needed for sleep. 04/19/15   Laurey Morale, MD  atorvastatin (LIPITOR) 20 MG tablet Take 20 mg by mouth daily. 09/07/15   Historical Provider, MD  Calcium Carbonate-Vitamin D (CALTRATE 600+D) 600-400 MG-UNIT per tablet Take 1 tablet by mouth daily.      Historical Provider, MD  carvedilol (COREG) 6.25 MG tablet TAKE 1 TABLET TWICE A DAY 11/25/15   Josue Hector, MD  estradiol (CLIMARA - DOSED IN MG/24 HR) 0.1 mg/24hr Place 1 patch onto the skin once a week.      Historical Provider, MD  furosemide (LASIX) 40 MG tablet Take 1 tablet (40 mg total) by mouth daily. 09/26/15   Josue Hector, MD  gabapentin (NEURONTIN) 300 MG capsule Take 300 mg by mouth 2 (two) times daily.    Historical Provider, MD  ketorolac (ACULAR) 0.5 % ophthalmic solution Place 1 drop into the right eye 2 (two)  times daily. 03/17/15   Historical Provider, MD  levothyroxine (SYNTHROID, LEVOTHROID) 125 MCG tablet Take 1 tablet (125 mcg total) by mouth daily. 02/01/15   Josue Hector, MD  loperamide (IMODIUM A-D) 2 MG tablet Take 2 mg by mouth 4 (four) times daily as needed for diarrhea or loose stools.    Historical Provider, MD  meclizine (ANTIVERT) 25 MG tablet TAKE 1/2 OR 1 TABLET BY MOUTH EVERY 6 HOURS AS NEEDED FOR DIZZINESS 04/19/15   Laurey Morale, MD  Multiple Vitamins-Minerals (CENTRUM SILVER PO) Take 1 tablet by mouth daily.      Historical Provider, MD  potassium chloride SA (K-DUR,KLOR-CON) 20 MEQ tablet Take 1 tablet (20 mEq total) by mouth 2 (two) times daily. 09/12/15   Josue Hector, MD  quinapril (ACCUPRIL) 20 MG tablet Take 1 tablet (20 mg total) by mouth daily. 09/12/15   Josue Hector, MD  spironolactone (ALDACTONE) 25 MG tablet TAKE 1 TABLET DAILY 11/22/15   Josue Hector, MD  vitamin B-12 (CYANOCOBALAMIN) 1000 MCG tablet Take 1,000 mcg by mouth daily.      Historical Provider, MD  warfarin (COUMADIN) 5 MG tablet TAKE AS DIRECTED BY ANTICOAGULATION CLINIC 10/27/15   Josue Hector, MD   BP 138/79 mmHg  Pulse 105  Temp(Src) 97.8 F (36.6 C) (Oral)  Resp 20  Ht 5\' 10"  (1.778 m)  Wt 177 lb (80.287 kg)  BMI 25.40 kg/m2  SpO2 98% Physical Exam  Constitutional: She is oriented to person, place, and time. She appears well-nourished.  HENT:  Head: Normocephalic and atraumatic.  Mouth/Throat: Oropharynx is clear and moist.  Eyes: Conjunctivae are normal. Pupils are equal, round, and reactive to light.  Neck: Normal range of motion. Neck supple.  Cardiovascular: An irregularly irregular rhythm present. Tachycardia present.   Pulmonary/Chest: Effort normal and breath sounds normal. No respiratory distress. She has no wheezes. She has no rales.  Abdominal: Soft. Bowel sounds are normal. There is no tenderness. There is no rebound and no guarding.  Musculoskeletal: Normal range of motion.   Lymphadenopathy:    She has no cervical adenopathy.  Neurological: She is alert and oriented to person, place, and time.  Skin: Skin is warm and dry. She is not diaphoretic.  Psychiatric: She has a normal mood and affect.    ED Course  Procedures (including critical care time) Labs Review Labs Reviewed  CBC WITH DIFFERENTIAL/PLATELET - Abnormal; Notable for the following:    WBC 31.5 (*)    Hemoglobin 15.6 (*)  HCT 47.4 (*)    Neutro Abs 29.3 (*)    All other components within normal limits  COMPREHENSIVE METABOLIC PANEL - Abnormal; Notable for the following:    Chloride 97 (*)    Glucose, Bld 199 (*)    BUN 30 (*)    Total Bilirubin 1.5 (*)    All other components within normal limits  PROTIME-INR - Abnormal; Notable for the following:    Prothrombin Time 24.9 (*)    INR 2.28 (*)    All other components within normal limits  TROPONIN I  URINALYSIS, ROUTINE W REFLEX MICROSCOPIC (NOT AT [facility])  I-STAT CG4 LACTIC ACID, ED    Imaging Review No results found. I have personally reviewed and evaluated these images and lab results as part of my medical decision-making.   EKG Interpretation   Date/Time:  Thursday December 15 2015 04:16:13 EST Ventricular Rate:  107 PR Interval:    QRS Duration: 88 QT Interval:  349 QTC Calculation: 466 R Axis:   81 Text Interpretation:  Atrial fibrillation  LVH  Confirmed by [facility]   MD, Cherylee Rawlinson ([id number]) on 12/15/2015 4:46:59 AM      MDM   Final diagnoses:  None    ED ECG REPORT    EKG Interpretation  Date/Time:  Thursday December 15 2015 04:16:13 EST Ventricular Rate:  107 PR Interval:    QRS Duration: 88 QT Interval:  349 QTC Calculation: 466 R Axis:   81 Text Interpretation:  Atrial fibrillation  LVH  Confirmed by [facility]  MD, Mikia Delaluz ([id number]) on 12/15/2015 4:46:59 AM      Results for orders placed or performed during the hospital encounter of 12/15/15  CBC with Differential/Platelet  Result Value Ref Range    WBC 31.5 (H) 4.0 - 10.5 K/uL   RBC 5.04 3.87 - 5.11 MIL/uL   Hemoglobin 15.6 (H) 12.0 - 15.0 g/dL   HCT 47.4 (H) 36.0 - 46.0 %   MCV 94.0 78.0 - 100.0 fL   MCH 31.0 26.0 - 34.0 pg   MCHC 32.9 30.0 - 36.0 g/dL   RDW 13.1 11.5 - 15.5 %   Platelets 307 150 - 400 K/uL   Neutrophils Relative % 93 %   Lymphocytes Relative 4 %   Monocytes Relative 3 %   Eosinophils Relative 0 %   Basophils Relative 0 %   Neutro Abs 29.3 (H) 1.7 - 7.7 K/uL   Lymphs Abs 1.3 0.7 - 4.0 K/uL   Monocytes Absolute 0.9 0.1 - 1.0 K/uL   Eosinophils Absolute 0.0 0.0 - 0.7 K/uL   Basophils Absolute 0.0 0.0 - 0.1 K/uL   WBC Morphology TOXIC GRANULATION   Comprehensive metabolic panel  Result Value Ref Range   Sodium 136 135 - 145 mmol/L   Potassium 4.1 3.5 - 5.1 mmol/L   Chloride 97 (L) 101 - 111 mmol/L   CO2 26 22 - 32 mmol/L   Glucose, Bld 199 (H) 65 - 99 mg/dL   BUN 30 (H) 6 - 20 mg/dL   Creatinine, Ser 0.86 0.44 - 1.00 mg/dL   Calcium 10.1 8.9 - 10.3 mg/dL   Total Protein 8.0 6.5 - 8.1 g/dL   Albumin 4.8 3.5 - 5.0 g/dL   AST 28 15 - 41 U/L   ALT 26 14 - 54 U/L   Alkaline Phosphatase 100 38 - 126 U/L   Total Bilirubin 1.5 (H) 0.3 - 1.2 mg/dL   GFR calc non Af Amer >60 >60 mL/min   GFR calc  Af Amer >60 >60 mL/min   Anion gap 13 5 - 15  Troponin I  Result Value Ref Range   Troponin I <0.03 <0.031 ng/mL  Protime-INR  Result Value Ref Range   Prothrombin Time 24.9 (H) 11.6 - 15.2 seconds   INR 2.28 (H) 0.00 - 1.49  I-Stat CG4 Lactic Acid, ED  Result Value Ref Range   Lactic Acid, Venous 4.85 (HH) 0.5 - 2.0 mmol/L   Comment NOTIFIED PHYSICIAN   I-Stat CG4 Lactic Acid, ED  Result Value Ref Range   Lactic Acid, Venous 2.86 (HH) 0.5 - 2.0 mmol/L   Comment NOTIFIED PHYSICIAN    Ct Abdomen Pelvis W Contrast  12/15/2015  CLINICAL DATA:  82 year old female with diarrhea. History of colon and uterine cancer with partial resection of the colon. EXAM: CT ABDOMEN AND PELVIS WITH CONTRAST TECHNIQUE:  Multidetector CT imaging of the abdomen and pelvis was performed using the standard protocol following bolus administration of intravenous contrast. CONTRAST:  167mL OMNIPAQUE IOHEXOL 300 MG/ML  SOLN COMPARISON:  CT dated 07/23/2010 FINDINGS: The visualized lung bases are clear. Bibasilar linear atelectasis/scarring noted. There is dilatation of the right heart chambers as well as dilatation of the left atrium. Correlation with echocardiogram recommended. No intra-abdominal free air.  Trace free fluid within the pelvis. The liver, gallbladder, pancreas appear unremarkable. There is irregularity of the splenic contour likely related to chronic infarct. The adrenal glands appear unremarkable. There is moderate bilateral renal atrophy. No hydronephrosis. The visualized ureters and urinary bladder appear unremarkable. Hysterectomy. There is a 4 cm duodenal diverticulum. There is mild thickening of the jejunal folds in the left hemiabdomen with haziness of the adjacent fat. Multiple normal caliber fluid-filled loops of small bowel noted within the abdomen and pelvis. Findings compatible with enteritis. There is abutment of loops of small bowel to the anterior peritoneal wall compatible with adhesions. There is postsurgical changes of partial colon resection with ileotransverse anastomosis. There is no evidence of bowel obstruction. There is sigmoid diverticulosis without active inflammatory changes. There is aortoiliac atherosclerotic disease. No portal venous gas identified. There is no adenopathy. Midline vertical anterior abdominal wall incisional scar. Degenerative changes of the spine. No acute fracture. IMPRESSION: Findings compatible with enteritis with possible mild ileus. No evidence of bowel obstruction. Sigmoid diverticulosis. Electronically Signed   By: Anner Crete M.D.   On: 12/15/2015 07:01   Dg Abd Acute W/chest  12/15/2015  CLINICAL DATA:  Mid abdominal pain for 2 days, and diarrhea. Nausea.  History of colon and uterine cancer. History of hypertension, atrial fibrillation. EXAM: DG ABDOMEN ACUTE W/ 1V CHEST COMPARISON:  Chest radiograph Apr 14, 2012 FINDINGS: Cardiac silhouette is moderately enlarged unchanged. Calcified aortic knob. Similar chronic interstitial changes increased lung volumes most consistent with COPD without pleural effusion or focal consolidation. No pneumothorax. Osteopenia. Soft tissue planes are nonsuspicious. Bowel gas pattern is nondilated and nonobstructive. Bowel anastomotic staples project in the RIGHT abdomen. Surgical clips in the pelvis. No intra-abdominal mass effect or pathologic calcifications. No free air. Osteopenia. IMPRESSION: Stable cardiomegaly and COPD.  No acute pulmonary process. Normal bowel gas pattern. Electronically Signed   By: Elon Alas M.D.   On: 12/15/2015 05:55   Medications  sodium chloride 0.9 % bolus 1,000 mL (1,000 mLs Intravenous New Bag/Given 12/15/15 0811)  sodium chloride 0.9 % bolus 1,000 mL (not administered)  ondansetron (ZOFRAN) injection 4 mg (not administered)  sodium chloride 0.9 % bolus 500 mL (0 mLs Intravenous Stopped 12/15/15 0508)  ondansetron (  ZOFRAN) 4 MG/2ML injection (4 mg  Given 12/15/15 0401)  pantoprazole (PROTONIX) injection 40 mg (40 mg Intravenous Given 12/15/15 0528)  sodium chloride 0.9 % bolus 1,000 mL (0 mLs Intravenous Stopped 12/15/15 0659)  iohexol (OMNIPAQUE) 300 MG/ML solution 100 mL (100 mLs Intravenous Contrast Given 12/15/15 0639)  sodium chloride 0.9 % bolus 1,000 mL (0 mLs Intravenous Stopped 12/15/15 0730)  metroNIDAZOLE (FLAGYL) IVPB 500 mg (500 mg Intravenous New Bag/Given 12/15/15 0737)   Will admit to medicine for dehydration and diarrhea  Case d/w Dr. Ferne Coe, inpatient step down      Duanne Duchesne, MD 12/15/15 [phone]

## 2015-12-15 NOTE — ED Notes (Signed)
Report received pt care assumed. Pt lying supine talking with md at bedside. Pt denies any pain or other c/o at this time.

## 2015-12-15 NOTE — ED Notes (Signed)
Pt declines I&O cath. States she will notify staff when she is able to produce urine sample.

## 2015-12-15 NOTE — ED Notes (Signed)
EDP notified of low b/p,hold Dilitazem until pressure is up

## 2015-12-15 NOTE — ED Notes (Signed)
Blood cultures collected by this rn and steve cothren, rt prior to flagyl initiation.

## 2015-12-15 NOTE — Care Management Note (Signed)
Case Management Note  Patient Details  Name: [name] MRN: [id number] Date of Birth: [birth year]  Subjective/Objective:    Date: 12/15/15 Spoke with patient at the bedside.  Introduced self as Tourist information centre manager and explained role in discharge planning and how to be reached.  Verified patient lives in town, alone independently in an apartment on [address], she works at [employer], pta patient very independent. Expressed potential need for no other DME.  Verified patient anticipates to go home alone,  at time of discharge and will have  part-time supervision by family when they come to visit her at this time to best of their knowledge.  Patient denied needing help with their medication.  Patient drives to MD appointments. Verified patient has PCP Cherlynn Perches.  Will need pt/ot eval.   Plan: CM will continue to follow for discharge planning and [facility] resources.                Action/Plan:   Expected Discharge Date:                  Expected Discharge Plan:  [place]  In-House Referral:     Discharge planning Services  CM Consult  Post Acute Care Choice:    Choice offered to:     DME Arranged:    DME Agency:     HH Arranged:    [place] Agency:     Status of Service:  In process, will continue to follow  Medicare Important Message Given:    Date Medicare IM Given:    Medicare IM give by:    Date Additional Medicare IM Given:    Additional Medicare Important Message give by:     If discussed at [place] of Stay Meetings, dates discussed:    Additional Comments:  Zenon Mayo, RN 12/15/2015, 3:44 PM

## 2015-12-15 NOTE — ED Notes (Signed)
Pt daughter contact information: June [place] [phone]

## 2015-12-15 NOTE — ED Notes (Signed)
Pt c/o upper abdominal pain x2 days, worse around 5p yesterday then vomiting since 1a

## 2015-12-16 ENCOUNTER — Encounter (HOSPITAL_COMMUNITY): Payer: Medicare Other

## 2015-12-16 DIAGNOSIS — K529 Noninfective gastroenteritis and colitis, unspecified: Secondary | ICD-10-CM

## 2015-12-16 DIAGNOSIS — I480 Paroxysmal atrial fibrillation: Secondary | ICD-10-CM

## 2015-12-16 LAB — COMPREHENSIVE METABOLIC PANEL
ALT: 15 U/L (ref 14–54)
AST: 16 U/L (ref 15–41)
Albumin: 2.6 g/dL — ABNORMAL LOW (ref 3.5–5.0)
Alkaline Phosphatase: 52 U/L (ref 38–126)
Anion gap: 6 (ref 5–15)
BILIRUBIN TOTAL: 1.4 mg/dL — AB (ref 0.3–1.2)
BUN: 11 mg/dL (ref 6–20)
CALCIUM: 8.1 mg/dL — AB (ref 8.9–10.3)
CO2: 24 mmol/L (ref 22–32)
CREATININE: 0.69 mg/dL (ref 0.44–1.00)
Chloride: 110 mmol/L (ref 101–111)
GFR calc Af Amer: >60 mL/min (ref >60)
Glucose, Bld: 117 mg/dL — ABNORMAL HIGH (ref 65–99)
POTASSIUM: 3.5 mmol/L (ref 3.5–5.1)
Sodium: 140 mmol/L (ref 135–145)
Total Protein: 4.7 g/dL — ABNORMAL LOW (ref 6.5–8.1)

## 2015-12-16 LAB — CBC
HCT: 35.9 % — ABNORMAL LOW (ref 36.0–46.0)
Hemoglobin: 11.5 g/dL — ABNORMAL LOW (ref 12.0–15.0)
MCH: 30.8 pg (ref 26.0–34.0)
MCHC: 32 g/dL (ref 30.0–36.0)
MCV: 96.2 fL (ref 78.0–100.0)
PLATELETS: 180 10*3/uL (ref 150–400)
RBC: 3.73 MIL/uL — ABNORMAL LOW (ref 3.87–5.11)
RDW: 13.4 % (ref 11.5–15.5)
WBC: 11.4 10*3/uL — ABNORMAL HIGH (ref 4.0–10.5)

## 2015-12-16 LAB — GASTROINTESTINAL PANEL BY PCR, STOOL (REPLACES STOOL CULTURE)

## 2015-12-16 LAB — C DIFFICILE QUICK SCREEN W PCR REFLEX
C DIFFICLE (CDIFF) ANTIGEN: NEGATIVE
C Diff interpretation: NEGATIVE
C Diff toxin: NEGATIVE

## 2015-12-16 LAB — PROTIME-INR
INR: 2.4 — ABNORMAL HIGH (ref 0.00–1.49)
Prothrombin Time: 25.9 seconds — ABNORMAL HIGH (ref 11.6–15.2)

## 2015-12-16 LAB — LACTIC ACID, PLASMA: Lactic Acid, Venous: 1 mmol/L (ref 0.5–2.0)

## 2015-12-16 MED ORDER — ATORVASTATIN CALCIUM 20 MG PO TABS
20.0000 mg | ORAL_TABLET | Freq: Every day | ORAL | Status: DC
  Administered 2015-12-16 – 2015-12-18 (×3): 20 mg via ORAL
  Filled 2015-12-16 (×3): qty 1

## 2015-12-16 MED ORDER — QUINAPRIL HCL 10 MG PO TABS: 10.0000 mg | ORAL_TABLET | Freq: Every day | ORAL | Status: DC

## 2015-12-16 MED ORDER — LOPERAMIDE HCL 2 MG PO CAPS
2.0000 mg | ORAL_CAPSULE | Freq: Four times a day (QID) | ORAL | Status: DC | PRN
  Administered 2015-12-16 – 2015-12-17 (×5): 2 mg via ORAL
  Filled 2015-12-16 (×5): qty 1

## 2015-12-16 MED ORDER — LISINOPRIL 10 MG PO TABS
10.0000 mg | ORAL_TABLET | Freq: Every day | ORAL | Status: DC
  Administered 2015-12-16 – 2015-12-18 (×3): 10 mg via ORAL
  Filled 2015-12-16 (×3): qty 1

## 2015-12-16 MED ORDER — LOPERAMIDE HCL 2 MG PO TABS: 2.0000 mg | ORAL_TABLET | Freq: Four times a day (QID) | ORAL | Status: DC | PRN

## 2015-12-16 MED ORDER — CARVEDILOL 3.125 MG PO TABS
3.1250 mg | ORAL_TABLET | Freq: Two times a day (BID) | ORAL | Status: DC
  Administered 2015-12-16 – 2015-12-18 (×4): 3.125 mg via ORAL
  Filled 2015-12-16 (×4): qty 1

## 2015-12-16 MED ORDER — WARFARIN SODIUM 5 MG PO TABS
5.0000 mg | ORAL_TABLET | Freq: Once | ORAL | Status: AC
  Administered 2015-12-16: 5 mg via ORAL
  Filled 2015-12-16: qty 1

## 2015-12-16 NOTE — Care Management Note (Signed)
Case Management Note  Patient Details  Name: OKIE KASKA MRN: [id number] Date of Birth: [birth year]  Subjective/Objective:   Per pt/ot eval, rec HHPT and HHOT, NCM spoke with patient and gave her the [facility] agency list, she states she is not ready to make a choice on the agency yet.  NCM left the list with her and she will make a decision later.                   Action/Plan:   Expected Discharge Date:                  Expected Discharge Plan:  [place]  In-House Referral:     Discharge planning Services  CM Consult  Post Acute Care Choice:    Choice offered to:     DME Arranged:    DME Agency:     HH Arranged:    [place] Agency:     Status of Service:  In process, will continue to follow  Medicare Important Message Given:    Date Medicare IM Given:    Medicare IM give by:    Date Additional Medicare IM Given:    Additional Medicare Important Message give by:     If discussed at [place] of Stay Meetings, dates discussed:    Additional Comments:  Zenon Mayo, RN 12/16/2015, 11:15 AM

## 2015-12-16 NOTE — Evaluation (Signed)
Occupational Therapy Evaluation Patient Details Name: [name] MRN: [id number] DOB: 08/22/1929 Today's Date: 12/16/2015    History of Present Illness 82 yo female admitted with sepsis, and dehydration with r/o Cdiff. Pt with L foot edema and pain. PMH: HTN, AFIB, PVD, HLD, colon CA, peripheral neuropathy, patent foramen ovale   Clinical Impression   PT admitted with L LE edema and swelling. Pt currently with functional limitiations due to the deficits listed below (see OT problem list). PTA independent with all adls and iadls. Pt has a small dog she cares for in addition to her apartment.  Pt will benefit from skilled OT to increase their independence and safety with adls and balance to allow discharge [place]. Pt could benefit from skilled OT to address balance with tub transfer.      Follow Up Recommendations  Home health OT    Equipment Recommendations  Other (comment) (defer to PT but possible need for cane)    Recommendations for Other Services PT consult     Precautions / Restrictions Precautions Precautions: Fall      Mobility Bed Mobility Overal bed mobility: Independent                Transfers Overall transfer level: Needs assistance   Transfers: Sit to/from Stand Sit to Stand: Supervision         General transfer comment: required use of grab bar in bathroom due to L LE pain    Balance                                            ADL Overall ADL's : Needs assistance/impaired Eating/Feeding: Independent   Grooming: Wash/dry hands;Wash/dry face;Oral care;Supervision/safety;Standing Grooming Details (indicate cue type and reason): pt at sink level but with weight shift to keep weight off L LE compared to the R                 Toilet Transfer: Supervision/safety         Tub/Shower Transfer Details (indicate cue type and reason): patient has a tub shower at home and will required single leg standing to transfer  without any Dme or grab bars present Functional mobility during ADLs: Supervision/safety (reaching for environemental supports) General ADL Comments: Pt lives alone with her dog and very proud of her independence. Pt currently requires single ue support for balance due to L LE  pain. pt could benefit from PT evaluation to assess possible need for cane     Vision     Perception     Praxis      Pertinent Vitals/Pain Pain Assessment: 0-10 Pain Score: 7  Pain Location: L foot pain Pain Intervention(s): Premedicated before session;Repositioned;Monitored during session     Hand Dominance Right   Extremity/Trunk Assessment Upper Extremity Assessment Upper Extremity Assessment: Overall WFL for tasks assessed   Lower Extremity Assessment Lower Extremity Assessment: Defer to PT evaluation   Cervical / Trunk Assessment Cervical / Trunk Assessment: Normal   Communication Communication Communication: No difficulties   Cognition Arousal/Alertness: Awake/alert Behavior During Therapy: WFL for tasks assessed/performed Overall Cognitive Status: Within Functional Limits for tasks assessed                     General Comments       Exercises       Shoulder Instructions      Home  Living Family/patient expects to be discharged to:: Private residence Living Arrangements: Alone Available Help at Discharge: Friend(s);Family;Available PRN/intermittently Type of Home: Apartment Home Access: Other (comment) (side walk or up steps to transfer from car to apartment)     Home Layout: One level     Bathroom Shower/Tub: Teacher, early years/pre: Standard     Home Equipment: None   Additional Comments: has a Biomedical engineer named "miss Daisy" works at [employer] part time drives      Prior Functioning/Environment Level of Independence: Independent             OT Diagnosis: Generalized weakness;Acute pain   OT Problem List: Decreased strength;Decreased activity  tolerance;Impaired balance (sitting and/or standing);Decreased cognition;Decreased safety awareness;Decreased knowledge of use of DME or AE;Decreased knowledge of precautions;Cardiopulmonary status limiting activity;Pain   OT Treatment/Interventions: Self-care/ADL training;Therapeutic exercise;Energy conservation;DME and/or AE instruction;Therapeutic activities;Patient/family education;Balance training    OT Goals(Current goals can be found in the care plan section) Acute Rehab OT Goals Patient Stated Goal: to return to independence working and taking care of her dog OT Goal Formulation: With patient Time For Goal Achievement: 12/30/15 Potential to Achieve Goals: Good  OT Frequency: Min 2X/week   Barriers to D/C: Decreased caregiver support  required to walk dog daily       Co-evaluation              End of Session Equipment Utilized During Treatment: Gait belt Nurse Communication: Mobility status;Precautions  Activity Tolerance: Patient tolerated treatment well Patient left: in chair;with call bell/phone within reach;with family/visitor present (niece present)   Time: [id number] OT Time Calculation (min): 24 min Charges:  OT General Charges $OT Visit: 1 Procedure OT Evaluation $OT Eval Moderate Complexity: 1 Procedure OT Treatments $Self Care/Home Management : 8-22 mins G-Codes:    Parke Poisson B December 18, 2015, 8:47 AM    Jeri Modena   OTR/L [phone] Office: [phone] .

## 2015-12-16 NOTE — Evaluation (Signed)
Physical Therapy Evaluation Patient Details Name: [name] MRN: [id number] DOB: May 31, 1929 Today's Date: 12/16/2015   History of Present Illness  82 yo female admitted with sepsis, and dehydration with r/o Cdiff. MRI revealed fx of navicular w/ appearance consistent w/ Charcot foot. Pt with L foot edema and pain. PMH: HTN, AFIB, PVD, HLD, colon CA, peripheral neuropathy, patent foramen ovale.  Clinical Impression  Pt admitted with above diagnosis. Pt currently with functional limitations due to the deficits listed below (see PT Problem List). [name] is limited by pain in Lt foot but demonstrates ability to ambulate in room at supervision level using cane.  Pt will benefit from skilled PT to increase their independence and safety with mobility to allow discharge to the venue listed below.      Follow Up Recommendations Home health PT;Supervision - Intermittent    Equipment Recommendations  None recommended by PT    Recommendations for Other Services       Precautions / Restrictions Precautions Precautions: Fall Precaution Comments: fx of Lt navicular Restrictions Weight Bearing Restrictions: No      Mobility  Bed Mobility Overal bed mobility: Independent             General bed mobility comments: Pt sitting in recliner chair upon PT arrival  Transfers Overall transfer level: Needs assistance Equipment used: Straight cane Transfers: Sit to/from Stand Sit to Stand: Supervision         General transfer comment: Min instability noted upon standing.  No cues needed, safe technique for sit<>stand  Ambulation/Gait Ambulation/Gait assistance: Supervision Ambulation Distance (Feet): 40 Feet Assistive device: Straight cane Gait Pattern/deviations: Step-to pattern;Decreased stance time - left;Decreased weight shift to left;Antalgic;Decreased step length - right   Gait velocity interpretation: Below normal speed for age/gender General Gait Details: Limited distance  due to pain in Lt foot.  Cues for proper technique using cane.  Stairs            Wheelchair Mobility    Modified Rankin (Stroke Patients Only)       Balance Overall balance assessment: Needs assistance Sitting-balance support: No upper extremity supported;Feet supported Sitting balance-Leahy Scale: Normal     Standing balance support: Single extremity supported;During functional activity Standing balance-Leahy Scale: Fair                               Pertinent Vitals/Pain Pain Assessment: 0-10 Pain Score: 7  Pain Location: Lt foot w/ ambulation Pain Descriptors / Indicators: Aching;Grimacing Pain Intervention(s): Limited activity within patient's tolerance;Monitored during session;Repositioned    Home Living Family/patient expects to be discharged to:: Private residence Living Arrangements: Alone Available Help at Discharge: Friend(s);Family;Available PRN/intermittently Type of Home: Apartment Home Access: Other (comment) (side walk or up steps to transfer from car to apartment)     Home Layout: One level Home Equipment: [name] - single point Additional Comments: has a dotson dog named "[name]" works at [employer] part time drives    Prior Function Level of Independence: Independent               Hand Dominance   Dominant Hand: Right    Extremity/Trunk Assessment   Upper Extremity Assessment: Defer to OT evaluation           Lower Extremity Assessment: LLE deficits/detail   LLE Deficits / Details: fx of navicular w/ foot and ankle swelling.  Limited ROM and strength due to pain  Cervical / Trunk  Assessment: Normal  Communication   Communication: No difficulties  Cognition Arousal/Alertness: Awake/alert Behavior During Therapy: WFL for tasks assessed/performed Overall Cognitive Status: Within Functional Limits for tasks assessed                      General Comments General comments (skin integrity, edema, etc.): noted  required larger sock for L LE due to pain and edema    Exercises General Exercises - Lower Extremity Ankle Circles/Pumps: AROM;Both;10 reps;Seated Long Arc Quad: AROM;Both;10 reps;Seated      Assessment/Plan    PT Assessment Patient needs continued PT services  PT Diagnosis Difficulty walking;Abnormality of gait;Acute pain   PT Problem List Decreased strength;Decreased range of motion;Decreased activity tolerance;Decreased balance;Decreased knowledge of use of DME;Decreased safety awareness;Pain;Impaired sensation  PT Treatment Interventions DME instruction;Gait training;Functional mobility training;Therapeutic activities;Therapeutic exercise;Balance training;Neuromuscular re-education;Patient/family education;Modalities   PT Goals (Current goals can be found in the Care Plan section) Acute Rehab PT Goals Patient Stated Goal: to return to independence working and taking care of her dog PT Goal Formulation: With patient Time For Goal Achievement: 12/23/15 Potential to Achieve Goals: Good    Frequency Min 2X/week   Barriers to discharge Decreased caregiver support lives alone    Co-evaluation               End of Session Equipment Utilized During Treatment: Gait belt Activity Tolerance: Patient limited by pain Patient left: in chair;with call bell/phone within reach;with family/visitor present Nurse Communication: Mobility status         Time: [id number] PT Time Calculation (min) (ACUTE ONLY): 17 min   Charges:   PT Evaluation $PT Eval Low Complexity: 1 Procedure     PT G Codes:       [name] PT, DPT [phone] Pager: [phone] 12/16/2015, 9:29 AM

## 2015-12-16 NOTE — Care Management Important Message (Signed)
Important Message  Patient Details  Name: [name] MRN: [id number] Date of Birth: [birth year]   Medicare Important Message Given:  Yes    Zenon Mayo, RN 12/16/2015, 11:16 AMImportant Message  Patient Details  Name: [name] MRN: [id number] Date of Birth: [birth year]   Medicare Important Message Given:  Yes    Zenon Mayo, RN 12/16/2015, 11:16 AM

## 2015-12-16 NOTE — Progress Notes (Signed)
[place] for Coumadin Indication: atrial fibrillation  Allergies  Allergen Reactions  . Codeine     REACTION: nausea   Patient Measurements: Height: 5\' 10"  (177.8 cm) Weight: 181 lb 7 oz (82.3 kg) IBW/kg (Calculated) : 68.5 Vital Signs: Temp: 98.1 F (36.7 C) (01/20 1100) Temp Source: Oral (01/20 1100) BP: 134/75 mmHg (01/20 1100) Labs:  Recent Labs  12/15/15 0350 12/15/15 1105 12/15/15 1432 12/16/15 0540 12/16/15 1523  HGB 15.6* 12.5  --  11.5*  --   HCT 47.4* 39.2  --  35.9*  --   PLT 307 230  --  180  --   LABPROT 24.9*  --  29.5*  --  25.9*  INR 2.28*  --  2.86*  --  2.40*  CREATININE 0.86 0.95  --  0.69  --   TROPONINI <0.03  --   --   --   --    Estimated Creatinine Clearance: 61.2 mL/min (by C-G formula based on Cr of 0.69).  Assessment: 82 year old female on chronic Coumadin for atrial fibrillation who was admitted with for r/o sepsis- Cdiff receiving IV Flagyl and po Vancomycin. INR on admission is therapeutic at 2.28. Will monitor INR closely with drug interaction with Flagyl. PTA dose was 5mg  po daily.   INR today is 2.4.  Goal of Therapy:  INR 2-3 Monitor platelets by anticoagulation protocol: Yes   Plan:  Warfarin 5mg  tonight x1 Monitor PT/INR daily - watch closely with DDI with Flagyl.   Andrey Cota. Diona Foley, PharmD, BCPS Clinical Pharmacist Pager [phone]  12/16/2015,4:18 PM

## 2015-12-16 NOTE — Progress Notes (Addendum)
[facility] PROGRESS NOTE  [name] [id number] DOB: 1929/01/01 DOA: 12/15/2015 PCP: Laurey Morale, MD  Brief narrative 81 year old female with history of hypertension, personal A. fib on anticoagulation, peripheral vascular disease, severe mitral regurgitation, hyperlipidemia, hypothyroidism, history of colon cancer, peripheral neuropathy presenting with generalized malaise and weakness for one week and wanted to admission had epigastric pain with nausea and vomiting. She also started having multiple episodes of watery diarrhea. About 2 and half weeks back she was given a course of oral ciprofloxacin for cellulitis of her left foot. In the ED patient was tachycardic and hypertensive. She had significant leukocytosis with potential 30 1.5K, elevated lactic acid of 4.85. Sepsis pathway was initiated in the ED. A CT of the abdomen and pelvis with contrast done showed antritis with mild ileus and diverticulosis. Admitted to stepdown for further management.  Assessment/Plan: Sepsis secondary to enteritis Markedly improved with IV hydration and antibiotics. WBC 11.4. Check repeat lactic acid. C. difficile negative. On empiric  Flagyl. Discontinue oral vancomycin. Follow GI pathogen panel. Will add empiric Cipro. Add Lomotil.  Severe dehydration Secondary to enteritis. On gentle hydration with improvement.  UTI Cultures growing enterococcus. Added empiric ciprofloxacin. Follow sensitivity.  Essential hypertension Medications held due to low blood pressure on presentation. Resumed ACE inhibitor and Coreg at lower dose. Holding Lasix.  Paroxysmal Afib On Coumadin which is continued. INR therapeutic. Resume Coreg at lower dose.  Hypothyroidism Continue Synthroid.  Peripheral vascular disease with neuropathy. Follow with PCP and rheumatologist. Continue Neurontin. Has chronic pain in her right ankle and leg. Has received several courses of antibiotics and prednisone as outpatient.  (Recently completed both Cipro and redness on). MRI of left foot done shows no sagittal fracture of the navicular with increased arthritic changes of the midfoot. Also had Charcot foot. Continue pain medication. Spoke with Dr. Doran Durand on the phone. Recommends no boat for comfort. May follow-up with him as outpatient if symptoms persisted or worsened.  Calorie malnutrition Dietitian consulted.   DVT prophylaxis: On Coumadin Code Status: DNR Family Communication: At bedside Disposition Plan: Transfer to medical floor. PT recommended home health.   Consultants:  Orthopedics (spoke with Dr. Doran Durand on the phone)  Procedures:  CT Abdomen and  pelvis   MRI left leg  Antibiotics:  Po vanco amd flagyl  HPI/Subjective: 4 episodes of  diarrhea since admission, c/o epigastric tenderness( was present before vomiting anddiarrhea started). Was on prednisone course as outpt for leg pain.  Objective: Filed Vitals:   12/16/15 0320 12/16/15 0716  BP: 97/48 127/75  Pulse:    Temp: 98 F (36.7 C) 97.7 F (36.5 C)  Resp: 18 18    Intake/Output Summary (Last 24 hours) at 12/16/15 0915 Last data filed at 12/16/15 0800  Gross per 24 hour  Intake 3144.17 ml  Output      0 ml  Net 3144.17 ml   Filed Weights   12/15/15 0316 12/15/15 1002 12/16/15 0440  Weight: 80.287 kg (177 lb) 80.2 kg (176 lb 12.9 oz) 82.3 kg (181 lb 7 oz)    Exam:   General:  NAD   HEENT: no apllor, moist mucosa  Chest: clear b/l   CVS: S1&S2 irregular, no murmurs  GI: soft, ND, epigastric tenderness, BS+  Musculoskeletal: warm, reddish discoloration over left leg and foot, tender left ankle and foot,distal pulse palpable  CNS: alert and oriented  Data Reviewed: Basic Metabolic Panel:  Recent Labs Lab 12/15/15 0350 12/15/15 1105 12/16/15 0540  NA 136 140 140  K 4.1 4.7 3.5  CL 97* 108 110  CO2 26 25 24   GLUCOSE 199* 187* 117*  BUN 30* 22* 11  CREATININE 0.86 0.95 0.69  CALCIUM 10.1 8.2* 8.1*    Liver Function Tests:  Recent Labs Lab 12/15/15 0350 12/16/15 0540  AST 28 16  ALT 26 15  ALKPHOS 100 52  BILITOT 1.5* 1.4*  PROT 8.0 4.7*  ALBUMIN 4.8 2.6*   No results for input(s): LIPASE, AMYLASE in the last 168 hours. No results for input(s): AMMONIA in the last 168 hours. CBC:  Recent Labs Lab 12/15/15 0350 12/15/15 1105 12/16/15 0540  WBC 31.5* 16.4* 11.4*  NEUTROABS 29.3* 13.3*  --   HGB 15.6* 12.5 11.5*  HCT 47.4* 39.2 35.9*  MCV 94.0 96.8 96.2  PLT 307 230 180   Cardiac Enzymes:  Recent Labs Lab 12/15/15 0350  TROPONINI <0.03   BNP (last 3 results) No results for input(s): BNP in the last 8760 hours.  ProBNP (last 3 results) No results for input(s): PROBNP in the last 8760 hours.  CBG: No results for input(s): GLUCAP in the last 168 hours.  Recent Results (from the past 240 hour(s))  Blood culture (routine x 2)     Status: None (Preliminary result)   Collection Time: 12/15/15  7:55 AM  Result Value Ref Range Status   Specimen Description   Final    BLOOD RIGHT FOREARM Performed at [facility]    Special Requests BOTTLES DRAWN AEROBIC AND ANAEROBIC  10CC  Final   Culture PENDING  Incomplete   Report Status PENDING  Incomplete  MRSA PCR Screening     Status: None   Collection Time: 12/15/15 10:35 AM  Result Value Ref Range Status   MRSA by PCR NEGATIVE NEGATIVE Final    Comment:        The GeneXpert MRSA Assay (FDA approved for NASAL specimens only), is one component of a comprehensive MRSA colonization surveillance program. It is not intended to diagnose MRSA infection nor to guide or monitor treatment for MRSA infections.      Studies: Ct Abdomen Pelvis W Contrast  12/15/2015  CLINICAL DATA:  82 year old female with diarrhea. History of colon and uterine cancer with partial resection of the colon. EXAM: CT ABDOMEN AND PELVIS WITH CONTRAST TECHNIQUE: Multidetector CT imaging of the abdomen and pelvis was performed using  the standard protocol following bolus administration of intravenous contrast. CONTRAST:  139mL OMNIPAQUE IOHEXOL 300 MG/ML  SOLN COMPARISON:  CT dated 07/23/2010 FINDINGS: The visualized lung bases are clear. Bibasilar linear atelectasis/scarring noted. There is dilatation of the right heart chambers as well as dilatation of the left atrium. Correlation with echocardiogram recommended. No intra-abdominal free air.  Trace free fluid within the pelvis. The liver, gallbladder, pancreas appear unremarkable. There is irregularity of the splenic contour likely related to chronic infarct. The adrenal glands appear unremarkable. There is moderate bilateral renal atrophy. No hydronephrosis. The visualized ureters and urinary bladder appear unremarkable. Hysterectomy. There is a 4 cm duodenal diverticulum. There is mild thickening of the jejunal folds in the left hemiabdomen with haziness of the adjacent fat. Multiple normal caliber fluid-filled loops of small bowel noted within the abdomen and pelvis. Findings compatible with enteritis. There is abutment of loops of small bowel to the anterior peritoneal wall compatible with adhesions. There is postsurgical changes of partial colon resection with ileotransverse anastomosis. There is no evidence of bowel obstruction. There is sigmoid diverticulosis without active inflammatory changes. There is aortoiliac  atherosclerotic disease. No portal venous gas identified. There is no adenopathy. Midline vertical anterior abdominal wall incisional scar. Degenerative changes of the spine. No acute fracture. IMPRESSION: Findings compatible with enteritis with possible mild ileus. No evidence of bowel obstruction. Sigmoid diverticulosis. Electronically Signed   By: Anner Crete M.D.   On: 12/15/2015 07:01   Mr Foot Left Wo Contrast  12/16/2015  CLINICAL DATA:  Intractable pain and redness and swelling the left foot. EXAM: MRI OF THE LEFT FOOT WITHOUT CONTRAST TECHNIQUE: Multiplanar,  multisequence MR imaging was performed. No intravenous contrast was administered. COMPARISON:  MRI dated 11/11/2015 and radiographs dated 04/26/2015 FINDINGS: There is a new sagittal fracture through the lateral aspect of the navicular. There are increased erosions in the lateral fracture fragment. There is left new flattening of the head of the talus adjacent to the navicular. With complete loss of the overlying articular cartilage. There is increased edema in the cuboid and cuneiforms with severe arthritis of dorsal aspects of the second and third tarsal metatarsal joints. There is new degeneration of the distal posterior tibialis tendon. There is small right ankle effusion. IMPRESSION: New sagittal fracture of the navicular with increased arthritic changes of the midfoot. The MRI appearance is consistent with a Charcot foot. However, because the patient has severe pain, I suspect the findings are a combination of severe degenerative arthritis due to flatfoot deformity with a new secondary insufficiency fracture of the navicular with progressive arthritis of the talonavicular joint. Electronically Signed   By: Lorriane Shire M.D.   On: 12/16/2015 08:15   Dg Abd Acute W/chest  12/15/2015  CLINICAL DATA:  Mid abdominal pain for 2 days, and diarrhea. Nausea. History of colon and uterine cancer. History of hypertension, atrial fibrillation. EXAM: DG ABDOMEN ACUTE W/ 1V CHEST COMPARISON:  Chest radiograph Apr 14, 2012 FINDINGS: Cardiac silhouette is moderately enlarged unchanged. Calcified aortic knob. Similar chronic interstitial changes increased lung volumes most consistent with COPD without pleural effusion or focal consolidation. No pneumothorax. Osteopenia. Soft tissue planes are nonsuspicious. Bowel gas pattern is nondilated and nonobstructive. Bowel anastomotic staples project in the RIGHT abdomen. Surgical clips in the pelvis. No intra-abdominal mass effect or pathologic calcifications. No free air.  Osteopenia. IMPRESSION: Stable cardiomegaly and COPD.  No acute pulmonary process. Normal bowel gas pattern. Electronically Signed   By: Elon Alas M.D.   On: 12/15/2015 05:55    Scheduled Meds: . gabapentin  300 mg Oral QHS  . ketorolac  1 drop Right Eye BID  . levothyroxine  125 mcg Oral QAC breakfast  . metoprolol  5 mg Intravenous Q4H  . metronidazole  500 mg Intravenous Q8H  . ondansetron (ZOFRAN) IV  4 mg Intravenous 4 times per day  . sodium chloride  3 mL Intravenous Q12H  . vancomycin  125 mg Oral QID  . Warfarin - Pharmacist Dosing Inpatient   Does not apply q1800   Continuous Infusions: . sodium chloride 100 mL/hr at 12/16/15 0640     Time spent: 25 minutes    Markita Stcharles, [place] Pager [phone]. If 7PM-7AM, please contact night-coverage at [email or web address], password [facility] 12/16/2015, 9:15 AM  LOS: 1 day

## 2015-12-17 ENCOUNTER — Inpatient Hospital Stay (HOSPITAL_COMMUNITY): Payer: Medicare Other

## 2015-12-17 DIAGNOSIS — I350 Nonrheumatic aortic (valve) stenosis: Secondary | ICD-10-CM

## 2015-12-17 DIAGNOSIS — E038 Other specified hypothyroidism: Secondary | ICD-10-CM

## 2015-12-17 LAB — URINE CULTURE

## 2015-12-17 LAB — PROTIME-INR
INR: 3.05 — AB (ref 0.00–1.49)
PROTHROMBIN TIME: 31 s — AB (ref 11.6–15.2)

## 2015-12-17 LAB — PROCALCITONIN: Procalcitonin: 1.41 ng/mL

## 2015-12-17 MED ORDER — WHITE PETROLATUM GEL
Status: AC
  Administered 2015-12-17: 10:00:00
  Filled 2015-12-17: qty 1

## 2015-12-17 NOTE — Progress Notes (Signed)
[place] for Coumadin Indication: atrial fibrillation  Allergies  Allergen Reactions  . Codeine     REACTION: nausea   Patient Measurements: Height: 5\' 10"  (177.8 cm) Weight: 187 lb 2.7 oz (84.9 kg) IBW/kg (Calculated) : 68.5 Vital Signs: Temp: 97.9 F (36.6 C) (01/21 0337) Temp Source: Oral (01/21 0337) BP: 142/82 mmHg (01/21 0830) Pulse Rate: 54 (01/21 0337) Labs:  Recent Labs  12/15/15 0350 12/15/15 1105 12/15/15 1432 12/16/15 0540 12/16/15 1523 12/17/15 0638  HGB 15.6* 12.5  --  11.5*  --   --   HCT 47.4* 39.2  --  35.9*  --   --   PLT 307 230  --  180  --   --   LABPROT 24.9*  --  29.5*  --  25.9* 31.0*  INR 2.28*  --  2.86*  --  2.40* 3.05*  CREATININE 0.86 0.95  --  0.69  --   --   TROPONINI <0.03  --   --   --   --   --    Estimated Creatinine Clearance: 62.1 mL/min (by C-G formula based on Cr of 0.69).  Assessment: 82 year old female on chronic Coumadin for atrial fibrillation who was admitted on 12/15/2015 with for r/o sepsis- Cdiff receiving IV Flagyl and po Vancomycin. Cdiff negative so off of vancomycin but started on cipro yesterday as well. INR on admission was therapeutic at 2.28. Today INR has trended up significantly to SUPRAtherapeutic level at 3.04 from 2.40 on home dose likely 2/2 interaction with flagyl and cipro. Will continue to monitor INR closely. PTA dose was 5mg  po daily.   Goal of Therapy:  INR 2-3 Monitor platelets by anticoagulation protocol: Yes   Plan:  Hold warfarin tonight Monitor PT/INR daily - watch closely with DDI with Flagyl/cipro F/u need to continue flagyl and switch from ciprofloxacin since GI panel negative and UCx positive for enteroccocus resistant to levofloxacin  Morena Mckissack K. Velva Harman, PharmD, BCPS, CPP Clinical Pharmacist Pager: [phone] Phone: [phone] 12/17/2015 9:18 AM

## 2015-12-17 NOTE — Progress Notes (Signed)
Called report to [employer] on [address], will transfer pt in wheelchair. Consuelo Pandy RN

## 2015-12-17 NOTE — Progress Notes (Signed)
NURSING PROGRESS NOTE  KARLE[facility] GONGORA [id number] Transfer Data: 12/17/2015 5:29 PM Attending Provider: Jonetta Osgood, MD PCP:FRY,STEPHEN A, MD Code Status: DNR  LOUKISHA KHOSLA is a 82 y.o. female patient transferred from [place]  -No acute distress noted.  -No complaints of shortness of breath.  -No complaints of chest pain.     Blood pressure 131/57, pulse 68, temperature 97.9 F (36.6 C), temperature source Oral, resp. rate 19, height 5\' 10"  (1.778 m), weight 84.9 kg (187 lb 2.7 oz), SpO2 100 %.   IV Fluids:  IV in place, SL. Allergies:  Codeine  Past Medical History:   has a past medical history of Hypertension; Chest pain, unspecified; Atrial fibrillation ([place]); Mitral valve disorder; Patent foramen ovale; Peripheral vascular disease ([place]); Venous insufficiency; Hyperlipidemia; Impaired glucose tolerance; Hypothyroid; Diverticulosis of colon; History of colonic polyps; History of colon cancer; Splenic infarction; History of uterine cancer; UTI (lower urinary tract infection); Osteoarthritis; Chronic low back pain; Osteoporosis; Peripheral neuropathy ([place]); Anxiety; Vitamin B12 deficiency; History of colonoscopy; and Cataract.  Past Surgical History:   has past surgical history that includes hysterectomy for uterine cancer; Anterior and posterior vaginal repair (03/2006); basal cell skin cancer Moh's surgery; right hemicolectomy for colon cancer (1994); lumbar laminectomy for spinal stenosis (2006); left carpal tunnel surgery (02/2007); Retinal detachment repair w/ scleral buckle (Right, 03-01-14); Colonoscopy (05-25-10); Eye surgery; and Cataract extraction, bilateral.  Social History:   reports that she has never smoked. She has never used smokeless tobacco. She reports that she drinks alcohol. She reports that she does not use illicit drugs.  Skin: Intact  Patient/Family orientated to room. Information packet given to patient/family. Admission inpatient armband information verified  with patient/family to include name and date of birth and placed on patient arm. Side rails up x 2, fall assessment and education completed with patient/family. Patient/family able to verbalize understanding of risk associated with falls and verbalized understanding to call for assistance before getting out of bed. Call light within reach. Patient/family able to voice and demonstrate understanding of unit orientation instructions.    Will continue to evaluate and treat per MD orders.

## 2015-12-17 NOTE — Progress Notes (Signed)
PATIENT DETAILS Name: [name] Age: 82 y.o. Sex: female Date of Birth: 1929/01/08 Admit Date: 12/15/2015 Admitting Physician Waldemar Dickens, MD PCP:FRY,STEPHEN A, MD  Brief Narrative:  82 year old female with past medical history of atrial fibrillation on Coumadin presented the hospital on 1/19 with vomiting and diarrhea. She was found to be tachycardic, hypotensive , CT of the abdomen showed enteritis in the mid ileus. She had profound leukocytosis on admission. She was subsequently admitted to the stepdown unit and started on IV Flagyl (empirically Tx for C. Difficile-had ciprofloxacin a few weeks prior to this admission). She has rapidly improved, blood cultures and stool studies are negative. Plans are to discontinue all antibiotics, transfer to stepdown, and if clinical improvement continues discharged home in the next 1-2 days.  Subjective: No vomiting, last BM was yesterday evening  Assessment/Plan: Principal Problem: Sepsis: secondary to enteritis. Sepsis pathophysiology has resolved and the patient has rapidly improved. Blood cultures negative so far, stool C. Difficile PCR/GI pathogen panel negative. Urine culture positive for enterococcus-however felt to be a contaminant rather than an infection as a UA on the same day is not consistent with a UTI. I suspect this was a viral syndrome, we will discontinue all antibiotics and monitor. Transfer out of stepdown today , if continues to improve clinically off antibiotics-suspect can be discharged home on 1/22. Mobilize with physical therapy.  Active Problems: Enteritis: vomiting/diarrhea resolved. Advance to regular diet today. Stool studies negative-we will discontinue Flagyl. See above.  Dehydration:  resolved with IV fluids and resolution of  Vomiting/diarrhea. Stop IV fluids. Diet advanced.  ? UTI: Doubt UTI- UA not suggestive of UTI- however urine culture growing enterococcus-sensitivities pending, patient  does not have any symptoms. Stop all antibiotics and monitor.  Essential hypertension: blood pressure-improving - should continue to improve over the next few days.Continue Coreg and Lisinopril at lower dose- suspect could be transitioned back to outpatient dosing output these medications at discharge.  Aldactone and Lasix remain on hold.  Paroxysmal A. Fib: rate controlled with Coreg, on Coumadin-INR  Slightly supratherapeutic- pharmacy managing.  Hypothyroidism: continue Synthroid  Dyslipidemia: continue statin  Moderate to severe mitral stenosis, moderate MR, mild AS : chronic issues- continue outpatient follow-up/monitoring with Dr. Johnsie Cancel  Suspected  Chronic diastolic heart failure: Discontinue IV fluids, resume diuretics on discharge if blood pressure allows.  Peripheral neuropathy: Continue Neurontin  History of Charcot's foot with sagittal fracture of the navicular bone: MRI left foot findings was discussed with Dr. Doran Durand by Dr.Dhungel- recommendations were to place [facility] and weight bearing as tolerated. Outpatient follow-up with Dr. Doran Durand.  Disposition: Remain inpatient  Antimicrobial agents  See below  Anti-infectives    Start     Dose/Rate Route Frequency Ordered Stop   12/15/15 1600  metroNIDAZOLE (FLAGYL) IVPB 500 mg     500 mg 100 mL/hr over 60 Minutes Intravenous Every 8 hours 12/15/15 1058     12/15/15 1200  metroNIDAZOLE (FLAGYL) tablet 500 mg  Status:  Discontinued     500 mg Oral 4 times per day 12/15/15 1024 12/15/15 1057   12/15/15 1100  vancomycin (VANCOCIN) 50 mg/mL oral solution 125 mg  Status:  Discontinued     125 mg Oral 4 times daily 12/15/15 1056 12/16/15 1126   12/15/15 0715  metroNIDAZOLE (FLAGYL) IVPB 500 mg     500 mg 100 mL/hr over 60 Minutes Intravenous  Once 12/15/15 0713 12/15/15  0837      DVT Prophylaxis: Coumadin  Code Status:  DNR  Family Communication None at bedside  Procedures: None  CONSULTS:  None  Time  spent 30 minutes-Greater than 50% of this time was spent in counseling, explanation of diagnosis, planning of further management, and coordination of care.  MEDICATIONS: Scheduled Meds: . atorvastatin  20 mg Oral Daily  . carvedilol  3.125 mg Oral BID WC  . gabapentin  300 mg Oral QHS  . ketorolac  1 drop Right Eye BID  . levothyroxine  125 mcg Oral QAC breakfast  . lisinopril  10 mg Oral Daily  . metronidazole  500 mg Intravenous Q8H  . ondansetron (ZOFRAN) IV  4 mg Intravenous 4 times per day  . sodium chloride  3 mL Intravenous Q12H  . Warfarin - Pharmacist Dosing Inpatient   Does not apply q1800   Continuous Infusions:  PRN Meds:.acetaminophen **OR** acetaminophen, ALPRAZolam, HYDROcodone-acetaminophen, loperamide, morphine injection    PHYSICAL EXAM: Vital signs in last 24 hours: Filed Vitals:   12/16/15 2309 12/17/15 0337 12/17/15 0403 12/17/15 0830  BP: 117/80 93/45 105/68 142/82  Pulse: 59 54    Temp: 98 F (36.7 C) 97.9 F (36.6 C)  98 F (36.7 C)  TempSrc: Oral Oral  Oral  Resp: 17 13 16 19   Height:      Weight:  84.9 kg (187 lb 2.7 oz)    SpO2: 97% 98%  96%    Weight change: 4.7 kg (10 lb 5.8 oz) Filed Weights   12/15/15 1002 12/16/15 0440 12/17/15 0337  Weight: 80.2 kg (176 lb 12.9 oz) 82.3 kg (181 lb 7 oz) 84.9 kg (187 lb 2.7 oz)   Body mass index is 26.86 kg/(m^2).   Gen Exam: Awake and alert with clear speech. Neck: Supple, No JVD.   Chest: B/L Clear.   CVS: S1 S2 Regular,syst murmurs.  Abdomen: soft, BS +, non tender, non distended.  Extremities: no edema, lower extremities warm to touch. Neurologic: Non Focal.   Skin: No Rash.   Wounds: N/A.    Intake/Output from previous day:  Intake/Output Summary (Last 24 hours) at 12/17/15 1051 Last data filed at 12/17/15 0900  Gross per 24 hour  Intake   3452 ml  Output      0 ml  Net   3452 ml     LAB RESULTS: CBC  Recent Labs Lab 12/15/15 0350 12/15/15 1105 12/16/15 0540  WBC 31.5*  16.4* 11.4*  HGB 15.6* 12.5 11.5*  HCT 47.4* 39.2 35.9*  PLT 307 230 180  MCV 94.0 96.8 96.2  MCH 31.0 30.9 30.8  MCHC 32.9 31.9 32.0  RDW 13.1 13.2 13.4  LYMPHSABS 1.3 1.0  --   MONOABS 0.9 1.9*  --   EOSABS 0.0 0.1  --   BASOSABS 0.0 0.0  --     Chemistries   Recent Labs Lab 12/15/15 0350 12/15/15 1105 12/16/15 0540  NA 136 140 140  K 4.1 4.7 3.5  CL 97* 108 110  CO2 26 25 24   GLUCOSE 199* 187* 117*  BUN 30* 22* 11  CREATININE 0.86 0.95 0.69  CALCIUM 10.1 8.2* 8.1*    CBG: No results for input(s): GLUCAP in the last 168 hours.  GFR Estimated Creatinine Clearance: 62.1 mL/min (by C-G formula based on Cr of 0.69).  Coagulation profile  Recent Labs Lab 12/15/15 0350 12/15/15 1432 12/16/15 1523 12/17/15 0638  INR 2.28* 2.86* 2.40* 3.05*    Cardiac Enzymes  Recent  Labs Lab 12/15/15 0350  TROPONINI <0.03    Invalid input(s): POCBNP No results for input(s): DDIMER in the last 72 hours. No results for input(s): HGBA1C in the last 72 hours. No results for input(s): CHOL, HDL, LDLCALC, TRIG, CHOLHDL, LDLDIRECT in the last 72 hours. No results for input(s): TSH, T4TOTAL, T3FREE, THYROIDAB in the last 72 hours.  Invalid input(s): FREET3 No results for input(s): VITAMINB12, FOLATE, FERRITIN, TIBC, IRON, RETICCTPCT in the last 72 hours. No results for input(s): LIPASE, AMYLASE in the last 72 hours.  Urine Studies No results for input(s): UHGB, CRYS in the last 72 hours.  Invalid input(s): UACOL, UAPR, USPG, UPH, UTP, UGL, UKET, UBIL, UNIT, UROB, ULEU, UEPI, UWBC, URBC, UBAC, CAST, UCOM, BILUA  MICROBIOLOGY: Recent Results (from the past 240 hour(s))  Blood culture (routine x 2)     Status: None (Preliminary result)   Collection Time: 12/15/15  7:30 AM  Result Value Ref Range Status   Specimen Description BLOOD LEFT AC  Final   Special Requests BOTTLES DRAWN AEROBIC AND ANAEROBIC 10CC  Final   Culture   Final    NO GROWTH 1 DAY Performed at [facility]    Report Status PENDING  Incomplete  Blood culture (routine x 2)     Status: None (Preliminary result)   Collection Time: 12/15/15  7:55 AM  Result Value Ref Range Status   Specimen Description BLOOD RIGHT FOREARM  Final   Special Requests BOTTLES DRAWN AEROBIC AND ANAEROBIC  10CC  Final   Culture   Final    NO GROWTH 1 DAY Performed at [facility]    Report Status PENDING  Incomplete  MRSA PCR Screening     Status: None   Collection Time: 12/15/15 10:35 AM  Result Value Ref Range Status   MRSA by PCR NEGATIVE NEGATIVE Final    Comment:        The GeneXpert MRSA Assay (FDA approved for NASAL specimens only), is one component of a comprehensive MRSA colonization surveillance program. It is not intended to diagnose MRSA infection nor to guide or monitor treatment for MRSA infections.   Urine culture     Status: None   Collection Time: 12/15/15  2:00 PM  Result Value Ref Range Status   Specimen Description URINE, RANDOM  Final   Special Requests NONE  Final   Culture >=100,000 COLONIES/mL ENTEROCOCCUS SPECIES  Final   Report Status 12/17/2015 FINAL  Final   Organism ID, Bacteria ENTEROCOCCUS SPECIES  Final      Susceptibility   Enterococcus species - MIC*    AMPICILLIN <=2 SENSITIVE Sensitive     LEVOFLOXACIN >=8 RESISTANT Resistant     NITROFURANTOIN <=16 SENSITIVE Sensitive     VANCOMYCIN 1 SENSITIVE Sensitive     * >=100,000 COLONIES/mL ENTEROCOCCUS SPECIES  C difficile quick scan w PCR reflex     Status: None   Collection Time: 12/16/15  7:45 AM  Result Value Ref Range Status   C Diff antigen NEGATIVE NEGATIVE Final   C Diff toxin NEGATIVE NEGATIVE Final   C Diff interpretation Negative for toxigenic C. difficile  Final  Gastrointestinal Panel by PCR , Stool     Status: None   Collection Time: 12/16/15  7:45 AM  Result Value Ref Range Status   Campylobacter species NOT DETECTED NOT DETECTED Final   Plesimonas shigelloides NOT DETECTED NOT  DETECTED Final   Salmonella species NOT DETECTED NOT DETECTED Final   Yersinia enterocolitica NOT DETECTED  NOT DETECTED Final   Vibrio species NOT DETECTED NOT DETECTED Final   Vibrio cholerae NOT DETECTED NOT DETECTED Final   Enteroaggregative E coli (EAEC) NOT DETECTED NOT DETECTED Final   Enteropathogenic E coli (EPEC) NOT DETECTED NOT DETECTED Final   Enterotoxigenic E coli (ETEC) NOT DETECTED NOT DETECTED Final   Shiga like toxin producing E coli (STEC) NOT DETECTED NOT DETECTED Final   E. coli O157 NOT DETECTED NOT DETECTED Final   Shigella/Enteroinvasive E coli (EIEC) NOT DETECTED NOT DETECTED Final   Cryptosporidium NOT DETECTED NOT DETECTED Final   Cyclospora cayetanensis NOT DETECTED NOT DETECTED Final   Entamoeba histolytica NOT DETECTED NOT DETECTED Final   Giardia lamblia NOT DETECTED NOT DETECTED Final   Adenovirus F40/41 NOT DETECTED NOT DETECTED Final   Astrovirus NOT DETECTED NOT DETECTED Final   Norovirus GI/GII NOT DETECTED NOT DETECTED Final   Rotavirus A NOT DETECTED NOT DETECTED Final   Sapovirus (I, II, IV, and V) NOT DETECTED NOT DETECTED Final    RADIOLOGY STUDIES/RESULTS: Ct Abdomen Pelvis W Contrast  12/15/2015  CLINICAL DATA:  82 year old female with diarrhea. History of colon and uterine cancer with partial resection of the colon. EXAM: CT ABDOMEN AND PELVIS WITH CONTRAST TECHNIQUE: Multidetector CT imaging of the abdomen and pelvis was performed using the standard protocol following bolus administration of intravenous contrast. CONTRAST:  154mL OMNIPAQUE IOHEXOL 300 MG/ML  SOLN COMPARISON:  CT dated 07/23/2010 FINDINGS: The visualized lung bases are clear. Bibasilar linear atelectasis/scarring noted. There is dilatation of the right heart chambers as well as dilatation of the left atrium. Correlation with echocardiogram recommended. No intra-abdominal free air.  Trace free fluid within the pelvis. The liver, gallbladder, pancreas appear unremarkable. There is  irregularity of the splenic contour likely related to chronic infarct. The adrenal glands appear unremarkable. There is moderate bilateral renal atrophy. No hydronephrosis. The visualized ureters and urinary bladder appear unremarkable. Hysterectomy. There is a 4 cm duodenal diverticulum. There is mild thickening of the jejunal folds in the left hemiabdomen with haziness of the adjacent fat. Multiple normal caliber fluid-filled loops of small bowel noted within the abdomen and pelvis. Findings compatible with enteritis. There is abutment of loops of small bowel to the anterior peritoneal wall compatible with adhesions. There is postsurgical changes of partial colon resection with ileotransverse anastomosis. There is no evidence of bowel obstruction. There is sigmoid diverticulosis without active inflammatory changes. There is aortoiliac atherosclerotic disease. No portal venous gas identified. There is no adenopathy. Midline vertical anterior abdominal wall incisional scar. Degenerative changes of the spine. No acute fracture. IMPRESSION: Findings compatible with enteritis with possible mild ileus. No evidence of bowel obstruction. Sigmoid diverticulosis. Electronically Signed   By: Anner Crete M.D.   On: 12/15/2015 07:01   Mr Foot Left Wo Contrast  12/16/2015  CLINICAL DATA:  Intractable pain and redness and swelling the left foot. EXAM: MRI OF THE LEFT FOOT WITHOUT CONTRAST TECHNIQUE: Multiplanar, multisequence MR imaging was performed. No intravenous contrast was administered. COMPARISON:  MRI dated 11/11/2015 and radiographs dated 04/26/2015 FINDINGS: There is a new sagittal fracture through the lateral aspect of the navicular. There are increased erosions in the lateral fracture fragment. There is left new flattening of the head of the talus adjacent to the navicular. With complete loss of the overlying articular cartilage. There is increased edema in the cuboid and cuneiforms with severe arthritis of  dorsal aspects of the second and third tarsal metatarsal joints. There is new degeneration of the  distal posterior tibialis tendon. There is small right ankle effusion. IMPRESSION: New sagittal fracture of the navicular with increased arthritic changes of the midfoot. The MRI appearance is consistent with a Charcot foot. However, because the patient has severe pain, I suspect the findings are a combination of severe degenerative arthritis due to flatfoot deformity with a new secondary insufficiency fracture of the navicular with progressive arthritis of the talonavicular joint. Electronically Signed   By: Lorriane Shire M.D.   On: 12/16/2015 08:15   Dg Abd Acute W/chest  12/15/2015  CLINICAL DATA:  Mid abdominal pain for 2 days, and diarrhea. Nausea. History of colon and uterine cancer. History of hypertension, atrial fibrillation. EXAM: DG ABDOMEN ACUTE W/ 1V CHEST COMPARISON:  Chest radiograph Apr 14, 2012 FINDINGS: Cardiac silhouette is moderately enlarged unchanged. Calcified aortic knob. Similar chronic interstitial changes increased lung volumes most consistent with COPD without pleural effusion or focal consolidation. No pneumothorax. Osteopenia. Soft tissue planes are nonsuspicious. Bowel gas pattern is nondilated and nonobstructive. Bowel anastomotic staples project in the RIGHT abdomen. Surgical clips in the pelvis. No intra-abdominal mass effect or pathologic calcifications. No free air. Osteopenia. IMPRESSION: Stable cardiomegaly and COPD.  No acute pulmonary process. Normal bowel gas pattern. Electronically Signed   By: Elon Alas M.D.   On: 12/15/2015 05:55    Oren Binet, MD  [facility] Pager:[phone]  If 7PM-7AM, please contact night-coverage [email or web address] Password [email or web address] 12/17/2015, 10:51 AM   LOS: 2 days

## 2015-12-17 NOTE — Progress Notes (Signed)
Pt refuses [place] ABI at this time, she states she has appt with foot specialist when she is discharged. Dr Sloan Leiter notified. Consuelo Pandy RN

## 2015-12-18 DIAGNOSIS — M14672 Charcot's joint, left ankle and foot: Secondary | ICD-10-CM

## 2015-12-18 LAB — PROTIME-INR
INR: 3.08 — AB (ref 0.00–1.49)
PROTHROMBIN TIME: 31.2 s — AB (ref 11.6–15.2)

## 2015-12-18 MED ORDER — QUINAPRIL HCL 20 MG PO TABS: 20.0000 mg | ORAL_TABLET | Freq: Every day | ORAL | Status: DC

## 2015-12-18 MED ORDER — FOSFOMYCIN TROMETHAMINE 3 G PO PACK
3.0000 g | PACK | Freq: Once | ORAL | Status: DC
  Filled 2015-12-18: qty 3

## 2015-12-18 MED ORDER — WARFARIN SODIUM 4 MG PO TABS
4.0000 mg | ORAL_TABLET | Freq: Once | ORAL | Status: DC
  Filled 2015-12-18: qty 1

## 2015-12-18 MED ORDER — FUROSEMIDE 40 MG PO TABS: 40.0000 mg | ORAL_TABLET | Freq: Every day | ORAL | Status: DC

## 2015-12-18 MED ORDER — SPIRONOLACTONE 25 MG PO TABS: 25.0000 mg | ORAL_TABLET | Freq: Every day | ORAL | Status: DC

## 2015-12-18 NOTE — Progress Notes (Signed)
Orthopedic Tech Progress Note Patient Details:  [name] 01-Dec-1928 [id number]  Patient ID: [name], female   DOB: 11-18-1929, 82 y.o.   MRN: [id number] Pt refused cam walker and stated that she will get ortho shoe from ortho doctor after she is discharged from hospital; RN  notified  Hildred Priest 12/18/2015, 8:44 AM

## 2015-12-18 NOTE — Discharge Summary (Signed)
[name], is a 82 y.o. female  DOB 1928-12-28  MRN [id number].  Admission date:  12/15/2015  Admitting Physician  Waldemar Dickens, MD  Discharge Date:  12/18/2015   Primary MD  Laurey Morale, MD  Recommendations for primary care physician for things to follow:   CBC, BMP and INR in 2-3 days   Admission Diagnosis  Dehydration [E86.0] Non-intractable vomiting with nausea, vomiting of unspecified type [R11.2] Atrial fibrillation, unspecified type ([place]) [I48.91] Diarrhea, unspecified type [R19.7]   Discharge Diagnosis  Dehydration [E86.0] Non-intractable vomiting with nausea, vomiting of unspecified type [R11.2] Atrial fibrillation, unspecified type ([place]) [I48.91] Diarrhea, unspecified type [R19.7]    Principal Problem:   Sepsis ([place]) Active Problems:   Hypothyroidism   Hyperlipemia   Essential hypertension   Moderate mitral regurgitation   ATRIAL FIBRILLATION   PERIPHERAL VASCULAR DISEASE   Aortic stenosis   Charcot's joint of left foot   Dehydration   Enteritis   Diarrhea      Past Medical History  Diagnosis Date  . Hypertension   . Chest pain, unspecified   . Atrial fibrillation ([place])   . Mitral valve disorder   . Patent foramen ovale   . Peripheral vascular disease ([place])   . Venous insufficiency   . Hyperlipidemia   . Impaired glucose tolerance   . Hypothyroid   . Diverticulosis of colon   . History of colonic polyps   . History of colon cancer   . Splenic infarction   . History of uterine cancer   . UTI (lower urinary tract infection)   . Osteoarthritis   . Chronic low back pain   . Osteoporosis   . Peripheral neuropathy ([place])   . Anxiety   . Vitamin B12 deficiency   . History of colonoscopy   . Cataract     REMOVED BILATERAL    Past Surgical History  Procedure  Laterality Date  . Hysterectomy for uterine cancer    . Anterior and posterior vaginal repair  03/2006    Dr.Neal  . Basal cell skin cancer moh's surgery    . Right hemicolectomy for colon cancer  1994  . Lumbar laminectomy for spinal stenosis  2006    L3-4  . Left carpal tunnel surgery  02/2007    Dr. Daylene Katayama  . Retinal detachment repair w/ scleral buckle le Right 03-01-14    per Dr. Zadie Rhine   . Colonoscopy  05-25-10    per Dr. Henrene Pastor, benign polyps, repeat in 5 yrs   . Eye surgery    . Cataract extraction, bilateral         HPI  from the history and physical done on the day of admission:    82 year old female patient with history of moderate to severe MR, moderate aortic stenosis, hypertension, hypothyroidism, atrial fibrillation on warfarin, peripheral vascular disease with left Charcot foot, recurrent redness and pain left foot etiology unclear, hyperlipidemia, diverticulosis who presented to [facility] [place] nausea and vomiting. Patient reports that for the past 1  week she's had generalized malaise weakness and shakiness. On 1/18 midday patient began having waxing and waning upper abdominal pain. She kept her usual appointment with her podiatrist. Unfortunately after supper/by bedtime patient developed significant nausea and vomiting about 2:30 AM symptoms are significant enough she proceeded to the ER for treatment. After arrival she began having intractable watery diarrhea occasionally associated with incontinence. She denied blood in the diarrhea. She's been having chills with rigors since onset of diarrhea. She denies sick contacts with similar symptoms, she denies reflux symptoms noting she's been on multiple courses of prednisone for her foot problem for the past year. About 2 and half weeks ago she was given another course of Cipro for presumed cellulitis of left foot. Of note patient is very concerned about her left foot since she is having significant intractable pain. She has  previously been treated for cellulitis as well as gout. Recently she was seen by rheumatologist told her he did not think her symptoms were gout. Discussion was held about possible steroid injection to the foot but she had diffuse foot pain and a prime/optimal location for injection could not be determined that this was not given.     Hospital Course:     Sepsis: secondary to enteritis. Sepsis pathophysiology has resolved and the patient has rapidly improved. Blood cultures negative so far, stool C. Difficile PCR/GI pathogen panel negative. Urine culture positive for enterococcus- vitamin one dose of fosfomycin. I suspect her gastroenteritis was a viral syndrome, we will discontinue all antibiotics and monitor. She feels better will be discharged home, she feels close to baseline now.   Enteritis: vomiting/diarrhea resolved. Advance to regular diet today. Stool studies negative-completely symptom-free now.  Dehydration: resolved with IV fluids and resolution of Vomiting/diarrhea. IV fluids stopped now completely symptom-free.  ? UTI: Cultures growing enterococci 1 dose of fosfomycin given.  Essential hypertension: blood pressure-improving - symptom-free, hold home dose diuretic and ACE inhibitor for 2 more days, resume Coreg only at this time.  Paroxysmal A. Fib: rate controlled with Coreg, on Coumadin-INR Slightly supratherapeutic- pharmacy managing.  Hypothyroidism: continue Synthroid  Dyslipidemia: continue statin  Moderate to severe mitral stenosis, moderate MR, mild AS : chronic issues- continue outpatient follow-up/monitoring with Dr. Johnsie Cancel  Suspected Chronic diastolic heart failure: Discontinued IV fluids, blood pressure improving, resume home dose diuretics and ACE inhibitor in couple of days.  Peripheral neuropathy: Continue Neurontin  History of Charcot's foot with sagittal fracture of the navicular bone: MRI left foot findings was discussed with Dr. Doran Durand by Dr.Dhungel-  recommendations were to place CAM Boot and weight bearing as tolerated. Outpatient follow-up with Dr. Doran Durand. Patient refused Cam boot.       Discharge Condition: Stable  Follow UP  Follow-up Information    Follow up with FRY,STEPHEN A, MD. Schedule an appointment as soon as possible for a visit in 3 days.   Specialty:  Family Medicine   Why:  INR check   Contact information:   [place] [address] [phone]       Follow up with Wylene Simmer, MD. Schedule an appointment as soon as possible for a visit in 1 week.   Specialty:  Orthopedic Surgery   Contact information:   [address] [place] [address] [phone]        Consults obtained - Dr Doran Durand over the phone  Diet and Activity recommendation: See Discharge Instructions below  Discharge Instructions  Discharge Instructions    Diet - low sodium heart healthy    Complete by:  As directed      Discharge instructions    Complete by:  As directed   Follow with Primary MD Laurey Morale, MD in 3 days   Get CBC, CMP, INR,  checked  by Primary MD next visit.    Activity: As tolerated with Full fall precautions use walker/cane & assistance as needed   Disposition Home    Diet:   Heart Healthy   For Heart failure patients - Check your Weight same time everyday, if you gain over 2 pounds, or you develop in leg swelling, experience more shortness of breath or chest pain, call your Primary MD immediately. Follow Cardiac Low Salt Diet and 1.5 lit/day fluid restriction.   On your next visit with your primary care physician please Get Medicines reviewed and adjusted.   Please request your Prim.MD to go over all Hospital Tests and Procedure/Radiological results at the follow up, please get all Hospital records sent to your Prim MD by signing hospital release before you go home.   If you experience worsening of your admission symptoms, develop shortness of  breath, life threatening emergency, suicidal or homicidal thoughts you must seek medical attention immediately by calling 911 or calling your MD immediately  if symptoms less severe.  You Must read complete instructions/literature along with all the possible adverse reactions/side effects for all the Medicines you take and that have been prescribed to you. Take any new Medicines after you have completely understood and accpet all the possible adverse reactions/side effects.   Do not drive, operating heavy machinery, perform activities at heights, swimming or participation in water activities or provide baby sitting services if your were admitted for syncope or siezures until you have seen by Primary MD or a Neurologist and advised to do so again.  Do not drive when taking Pain medications.    Do not take more than prescribed Pain, Sleep and Anxiety Medications  Special Instructions: If you have smoked or chewed Tobacco  in the last 2 yrs please stop smoking, stop any regular Alcohol  and or any Recreational drug use.  Wear Seat belts while driving.   Please note  You were cared for by a hospitalist during your hospital stay. If you have any questions about your discharge medications or the care you received while you were in the hospital after you are discharged, you can call the unit and asked to speak with the hospitalist on call if the hospitalist that took care of you is not available. Once you are discharged, your primary care physician will handle any further medical issues. Please note that NO REFILLS for any discharge medications will be authorized once you are discharged, as it is imperative that you return to your primary care physician (or establish a relationship with a primary care physician if you do not have one) for your aftercare needs so that they can reassess your need for medications and monitor your lab values.     Increase activity slowly    Complete by:  As directed               Discharge Medications       Medication List    TAKE these medications        ALPRAZolam 0.25 MG tablet  Commonly known as:  XANAX  Take 1 tablet (0.25 mg total) by mouth at bedtime as needed for sleep.  atorvastatin 20 MG tablet  Commonly known as:  LIPITOR  Take 20 mg by mouth daily.     CALTRATE 600+D 600-400 MG-UNIT tablet  Generic drug:  Calcium Carbonate-Vitamin D  Take 1 tablet by mouth daily.     carvedilol 6.25 MG tablet  Commonly known as:  COREG  TAKE 1 TABLET TWICE A DAY     CENTRUM SILVER PO  Take 1 tablet by mouth daily.     estradiol 0.1 mg/24hr patch  Commonly known as:  CLIMARA - Dosed in mg/24 hr  Place 1 patch onto the skin once a week. mondaya     furosemide 40 MG tablet  Commonly known as:  LASIX  Take 1 tablet (40 mg total) by mouth daily.  Start taking on:  12/20/2015     gabapentin 300 MG capsule  Commonly known as:  NEURONTIN  Take 300 mg by mouth at bedtime.     HYDROcodone-acetaminophen 5-325 MG tablet  Commonly known as:  NORCO/VICODIN  Take 1 tablet by mouth every 6 (six) hours as needed for moderate pain.     ketorolac 0.5 % ophthalmic solution  Commonly known as:  ACULAR  Place 1 drop into the right eye 2 (two) times daily.     levothyroxine 125 MCG tablet  Commonly known as:  SYNTHROID, LEVOTHROID  Take 1 tablet (125 mcg total) by mouth daily.     loperamide 2 MG tablet  Commonly known as:  IMODIUM A-D  Take 2 mg by mouth 4 (four) times daily as needed for diarrhea or loose stools.     meclizine 25 MG tablet  Commonly known as:  ANTIVERT  TAKE 1/2 OR 1 TABLET BY MOUTH EVERY 6 HOURS AS NEEDED FOR DIZZINESS     potassium chloride SA 20 MEQ tablet  Commonly known as:  K-DUR,KLOR-CON  Take 1 tablet (20 mEq total) by mouth 2 (two) times daily.     quinapril 20 MG tablet  Commonly known as:  ACCUPRIL  Take 1 tablet (20 mg total) by mouth daily.  Start taking on:  12/20/2015     spironolactone 25 MG tablet    Commonly known as:  ALDACTONE  Take 1 tablet (25 mg total) by mouth daily.  Start taking on:  12/20/2015     vitamin B-12 1000 MCG tablet  Commonly known as:  CYANOCOBALAMIN  Take 1,000 mcg by mouth daily.     warfarin 5 MG tablet  Commonly known as:  COUMADIN  TAKE AS DIRECTED BY ANTICOAGULATION CLINIC        Major procedures and Radiology Reports - PLEASE review detailed and final reports for all details, in brief -       Ct Abdomen Pelvis W Contrast  12/15/2015  CLINICAL DATA:  82 year old female with diarrhea. History of colon and uterine cancer with partial resection of the colon. EXAM: CT ABDOMEN AND PELVIS WITH CONTRAST TECHNIQUE: Multidetector CT imaging of the abdomen and pelvis was performed using the standard protocol following bolus administration of intravenous contrast. CONTRAST:  122mL OMNIPAQUE IOHEXOL 300 MG/ML  SOLN COMPARISON:  CT dated 07/23/2010 FINDINGS: The visualized lung bases are clear. Bibasilar linear atelectasis/scarring noted. There is dilatation of the right heart chambers as well as dilatation of the left atrium. Correlation with echocardiogram recommended. No intra-abdominal free air.  Trace free fluid within the pelvis. The liver, gallbladder, pancreas appear unremarkable. There is irregularity of the splenic contour likely related to chronic infarct. The adrenal glands appear unremarkable. There is  moderate bilateral renal atrophy. No hydronephrosis. The visualized ureters and urinary bladder appear unremarkable. Hysterectomy. There is a 4 cm duodenal diverticulum. There is mild thickening of the jejunal folds in the left hemiabdomen with haziness of the adjacent fat. Multiple normal caliber fluid-filled loops of small bowel noted within the abdomen and pelvis. Findings compatible with enteritis. There is abutment of loops of small bowel to the anterior peritoneal wall compatible with adhesions. There is postsurgical changes of partial colon resection with  ileotransverse anastomosis. There is no evidence of bowel obstruction. There is sigmoid diverticulosis without active inflammatory changes. There is aortoiliac atherosclerotic disease. No portal venous gas identified. There is no adenopathy. Midline vertical anterior abdominal wall incisional scar. Degenerative changes of the spine. No acute fracture. IMPRESSION: Findings compatible with enteritis with possible mild ileus. No evidence of bowel obstruction. Sigmoid diverticulosis. Electronically Signed   By: Anner Crete M.D.   On: 12/15/2015 07:01   Mr Foot Left Wo Contrast  12/16/2015  CLINICAL DATA:  Intractable pain and redness and swelling the left foot. EXAM: MRI OF THE LEFT FOOT WITHOUT CONTRAST TECHNIQUE: Multiplanar, multisequence MR imaging was performed. No intravenous contrast was administered. COMPARISON:  MRI dated 11/11/2015 and radiographs dated 04/26/2015 FINDINGS: There is a new sagittal fracture through the lateral aspect of the navicular. There are increased erosions in the lateral fracture fragment. There is left new flattening of the head of the talus adjacent to the navicular. With complete loss of the overlying articular cartilage. There is increased edema in the cuboid and cuneiforms with severe arthritis of dorsal aspects of the second and third tarsal metatarsal joints. There is new degeneration of the distal posterior tibialis tendon. There is small right ankle effusion. IMPRESSION: New sagittal fracture of the navicular with increased arthritic changes of the midfoot. The MRI appearance is consistent with a Charcot foot. However, because the patient has severe pain, I suspect the findings are a combination of severe degenerative arthritis due to flatfoot deformity with a new secondary insufficiency fracture of the navicular with progressive arthritis of the talonavicular joint. Electronically Signed   By: Lorriane Shire M.D.   On: 12/16/2015 08:15   Dg Abd Acute  W/chest  12/15/2015  CLINICAL DATA:  Mid abdominal pain for 2 days, and diarrhea. Nausea. History of colon and uterine cancer. History of hypertension, atrial fibrillation. EXAM: DG ABDOMEN ACUTE W/ 1V CHEST COMPARISON:  Chest radiograph Apr 14, 2012 FINDINGS: Cardiac silhouette is moderately enlarged unchanged. Calcified aortic knob. Similar chronic interstitial changes increased lung volumes most consistent with COPD without pleural effusion or focal consolidation. No pneumothorax. Osteopenia. Soft tissue planes are nonsuspicious. Bowel gas pattern is nondilated and nonobstructive. Bowel anastomotic staples project in the RIGHT abdomen. Surgical clips in the pelvis. No intra-abdominal mass effect or pathologic calcifications. No free air. Osteopenia. IMPRESSION: Stable cardiomegaly and COPD.  No acute pulmonary process. Normal bowel gas pattern. Electronically Signed   By: Elon Alas M.D.   On: 12/15/2015 05:55    Micro Results      Recent Results (from the past 240 hour(s))  Blood culture (routine x 2)     Status: None (Preliminary result)   Collection Time: 12/15/15  7:30 AM  Result Value Ref Range Status   Specimen Description BLOOD LEFT AC  Final   Special Requests BOTTLES DRAWN AEROBIC AND ANAEROBIC 10CC  Final   Culture   Final    NO GROWTH 2 DAYS Performed at [facility]    Report  Status PENDING  Incomplete  Blood culture (routine x 2)     Status: None (Preliminary result)   Collection Time: 12/15/15  7:55 AM  Result Value Ref Range Status   Specimen Description BLOOD RIGHT FOREARM  Final   Special Requests BOTTLES DRAWN AEROBIC AND ANAEROBIC  10CC  Final   Culture   Final    NO GROWTH 2 DAYS Performed at [facility]    Report Status PENDING  Incomplete  MRSA PCR Screening     Status: None   Collection Time: 12/15/15 10:35 AM  Result Value Ref Range Status   MRSA by PCR NEGATIVE NEGATIVE Final    Comment:        The GeneXpert MRSA Assay (FDA approved  for NASAL specimens only), is one component of a comprehensive MRSA colonization surveillance program. It is not intended to diagnose MRSA infection nor to guide or monitor treatment for MRSA infections.   Urine culture     Status: None   Collection Time: 12/15/15  2:00 PM  Result Value Ref Range Status   Specimen Description URINE, RANDOM  Final   Special Requests NONE  Final   Culture >=100,000 COLONIES/mL ENTEROCOCCUS SPECIES  Final   Report Status 12/17/2015 FINAL  Final   Organism ID, Bacteria ENTEROCOCCUS SPECIES  Final      Susceptibility   Enterococcus species - MIC*    AMPICILLIN <=2 SENSITIVE Sensitive     LEVOFLOXACIN >=8 RESISTANT Resistant     NITROFURANTOIN <=16 SENSITIVE Sensitive     VANCOMYCIN 1 SENSITIVE Sensitive     * >=100,000 COLONIES/mL ENTEROCOCCUS SPECIES  C difficile quick scan w PCR reflex     Status: None   Collection Time: 12/16/15  7:45 AM  Result Value Ref Range Status   C Diff antigen NEGATIVE NEGATIVE Final   C Diff toxin NEGATIVE NEGATIVE Final   C Diff interpretation Negative for toxigenic C. difficile  Final  Gastrointestinal Panel by PCR , Stool     Status: None   Collection Time: 12/16/15  7:45 AM  Result Value Ref Range Status   Campylobacter species NOT DETECTED NOT DETECTED Final   Plesimonas shigelloides NOT DETECTED NOT DETECTED Final   Salmonella species NOT DETECTED NOT DETECTED Final   Yersinia enterocolitica NOT DETECTED NOT DETECTED Final   Vibrio species NOT DETECTED NOT DETECTED Final   Vibrio cholerae NOT DETECTED NOT DETECTED Final   Enteroaggregative E coli (EAEC) NOT DETECTED NOT DETECTED Final   Enteropathogenic E coli (EPEC) NOT DETECTED NOT DETECTED Final   Enterotoxigenic E coli (ETEC) NOT DETECTED NOT DETECTED Final   Shiga like toxin producing E coli (STEC) NOT DETECTED NOT DETECTED Final   E. coli O157 NOT DETECTED NOT DETECTED Final   Shigella/Enteroinvasive E coli (EIEC) NOT DETECTED NOT DETECTED Final    Cryptosporidium NOT DETECTED NOT DETECTED Final   Cyclospora cayetanensis NOT DETECTED NOT DETECTED Final   Entamoeba histolytica NOT DETECTED NOT DETECTED Final   Giardia lamblia NOT DETECTED NOT DETECTED Final   Adenovirus F40/41 NOT DETECTED NOT DETECTED Final   Astrovirus NOT DETECTED NOT DETECTED Final   Norovirus GI/GII NOT DETECTED NOT DETECTED Final   Rotavirus A NOT DETECTED NOT DETECTED Final   Sapovirus (I, II, IV, and V) NOT DETECTED NOT DETECTED Final    Today   Subjective    [name] today has no headache,no chest abdominal pain,no new weakness tingling or numbness, feels much better wants to go home today.  Objective   Blood pressure 96/59, pulse 54, temperature 98.1 F (36.7 C), temperature source Oral, resp. rate 18, height 5\' 10"  (1.778 m), weight 84.9 kg (187 lb 2.7 oz), SpO2 96 %.   Intake/Output Summary (Last 24 hours) at 12/18/15 1037 Last data filed at 12/18/15 0200  Gross per 24 hour  Intake    660 ml  Output      0 ml  Net    660 ml    Exam Awake Alert, Oriented x 3, No new F.N deficits, Normal affect [place].AT,PERRAL Supple Neck,No JVD, No cervical lymphadenopathy appriciated.  Symmetrical Chest wall movement, Good air movement bilaterally, CTAB RRR,No Gallops,Rubs or new Murmurs, No Parasternal Heave +ve B.Sounds, Abd Soft, Non tender, No organomegaly appriciated, No rebound -guarding or rigidity. No Cyanosis, Clubbing or edema, No new Rash or bruise   Data Review   CBC w Diff:  Lab Results  Component Value Date   WBC 11.4* 12/16/2015   HGB 11.5* 12/16/2015   HCT 35.9* 12/16/2015   PLT 180 12/16/2015   LYMPHOPCT 6 12/15/2015   MONOPCT 12 12/15/2015   EOSPCT 1 12/15/2015   BASOPCT 0 12/15/2015    CMP:  Lab Results  Component Value Date   NA 140 12/16/2015   K 3.5 12/16/2015   CL 110 12/16/2015   CO2 24 12/16/2015   BUN 11 12/16/2015   CREATININE 0.69 12/16/2015   PROT 4.7* 12/16/2015   ALBUMIN 2.6* 12/16/2015   BILITOT  1.4* 12/16/2015   ALKPHOS 52 12/16/2015   AST 16 12/16/2015   ALT 15 12/16/2015  .   Total Time in preparing paper work, data evaluation and todays exam - 35 minutes  Thurnell Lose M.D on 12/18/2015 at 10:37 AM  [facility]   Office  [phone]

## 2015-12-18 NOTE — Progress Notes (Signed)
Nsg Discharge Note  Admit Date:  12/15/2015 Discharge date: 12/18/2015   [name] to be D/C'd Home per MD order.  AVS completed.  Copy for chart, and copy for patient signed, and dated. Patient/caregiver able to verbalize understanding.  Discharge Medication:   Medication List    TAKE these medications        ALPRAZolam 0.25 MG tablet  Commonly known as:  XANAX  Take 1 tablet (0.25 mg total) by mouth at bedtime as needed for sleep.     atorvastatin 20 MG tablet  Commonly known as:  LIPITOR  Take 20 mg by mouth daily.     CALTRATE 600+D 600-400 MG-UNIT tablet  Generic drug:  Calcium Carbonate-Vitamin D  Take 1 tablet by mouth daily.     carvedilol 6.25 MG tablet  Commonly known as:  COREG  TAKE 1 TABLET TWICE A DAY     CENTRUM SILVER PO  Take 1 tablet by mouth daily.     estradiol 0.1 mg/24hr patch  Commonly known as:  CLIMARA - Dosed in mg/24 hr  Place 1 patch onto the skin once a week. mondaya     furosemide 40 MG tablet  Commonly known as:  LASIX  Take 1 tablet (40 mg total) by mouth daily.  Start taking on:  12/20/2015     gabapentin 300 MG capsule  Commonly known as:  NEURONTIN  Take 300 mg by mouth at bedtime.     HYDROcodone-acetaminophen 5-325 MG tablet  Commonly known as:  NORCO/VICODIN  Take 1 tablet by mouth every 6 (six) hours as needed for moderate pain.     ketorolac 0.5 % ophthalmic solution  Commonly known as:  ACULAR  Place 1 drop into the right eye 2 (two) times daily.     levothyroxine 125 MCG tablet  Commonly known as:  SYNTHROID, LEVOTHROID  Take 1 tablet (125 mcg total) by mouth daily.     loperamide 2 MG tablet  Commonly known as:  IMODIUM A-D  Take 2 mg by mouth 4 (four) times daily as needed for diarrhea or loose stools.     meclizine 25 MG tablet  Commonly known as:  ANTIVERT  TAKE 1/2 OR 1 TABLET BY MOUTH EVERY 6 HOURS AS NEEDED FOR DIZZINESS     potassium chloride SA 20 MEQ tablet  Commonly known as:  K-DUR,KLOR-CON   Take 1 tablet (20 mEq total) by mouth 2 (two) times daily.     quinapril 20 MG tablet  Commonly known as:  ACCUPRIL  Take 1 tablet (20 mg total) by mouth daily.  Start taking on:  12/20/2015     spironolactone 25 MG tablet  Commonly known as:  ALDACTONE  Take 1 tablet (25 mg total) by mouth daily.  Start taking on:  12/20/2015     vitamin B-12 1000 MCG tablet  Commonly known as:  CYANOCOBALAMIN  Take 1,000 mcg by mouth daily.     warfarin 5 MG tablet  Commonly known as:  COUMADIN  TAKE AS DIRECTED BY ANTICOAGULATION CLINIC        Discharge Assessment: Filed Vitals:   12/18/15 0528 12/18/15 1317  BP: 96/59 124/69  Pulse: 54 86  Temp: 98.1 F (36.7 C) 98.6 F (37 C)  Resp: 18 19   Skin clean, dry and intact without evidence of skin break down, no evidence of skin tears noted. IV catheter discontinued intact. Site without signs and symptoms of complications - no redness or edema noted at insertion  site, patient denies c/o pain - only slight tenderness at site.  Dressing with slight pressure applied.  D/c Instructions-Education: Discharge instructions given to patient/family with verbalized understanding. D/c education completed with patient/family including follow up instructions, medication list, d/c activities limitations if indicated, with other d/c instructions as indicated by MD - patient able to verbalize understanding, all questions fully answered. Patient instructed to return to ED, call 911, or call MD for any changes in condition.  Patient escorted via [place], and D/C home via private auto.  Salley Slaughter, RN 12/18/2015 3:47 PM  Nsg Discharge Note  Admit Date:  12/15/2015 Discharge date: 12/18/2015   [name] to be D/C'd Home per MD order.  AVS completed.  Copy for chart, and copy for patient signed, and dated. Patient/caregiver able to verbalize understanding.  Discharge Medication:   Medication List    TAKE these medications        ALPRAZolam 0.25 MG  tablet  Commonly known as:  XANAX  Take 1 tablet (0.25 mg total) by mouth at bedtime as needed for sleep.     atorvastatin 20 MG tablet  Commonly known as:  LIPITOR  Take 20 mg by mouth daily.     CALTRATE 600+D 600-400 MG-UNIT tablet  Generic drug:  Calcium Carbonate-Vitamin D  Take 1 tablet by mouth daily.     carvedilol 6.25 MG tablet  Commonly known as:  COREG  TAKE 1 TABLET TWICE A DAY     CENTRUM SILVER PO  Take 1 tablet by mouth daily.     estradiol 0.1 mg/24hr patch  Commonly known as:  CLIMARA - Dosed in mg/24 hr  Place 1 patch onto the skin once a week. mondaya     furosemide 40 MG tablet  Commonly known as:  LASIX  Take 1 tablet (40 mg total) by mouth daily.  Start taking on:  12/20/2015     gabapentin 300 MG capsule  Commonly known as:  NEURONTIN  Take 300 mg by mouth at bedtime.     HYDROcodone-acetaminophen 5-325 MG tablet  Commonly known as:  NORCO/VICODIN  Take 1 tablet by mouth every 6 (six) hours as needed for moderate pain.     ketorolac 0.5 % ophthalmic solution  Commonly known as:  ACULAR  Place 1 drop into the right eye 2 (two) times daily.     levothyroxine 125 MCG tablet  Commonly known as:  SYNTHROID, LEVOTHROID  Take 1 tablet (125 mcg total) by mouth daily.     loperamide 2 MG tablet  Commonly known as:  IMODIUM A-D  Take 2 mg by mouth 4 (four) times daily as needed for diarrhea or loose stools.     meclizine 25 MG tablet  Commonly known as:  ANTIVERT  TAKE 1/2 OR 1 TABLET BY MOUTH EVERY 6 HOURS AS NEEDED FOR DIZZINESS     potassium chloride SA 20 MEQ tablet  Commonly known as:  K-DUR,KLOR-CON  Take 1 tablet (20 mEq total) by mouth 2 (two) times daily.     quinapril 20 MG tablet  Commonly known as:  ACCUPRIL  Take 1 tablet (20 mg total) by mouth daily.  Start taking on:  12/20/2015     spironolactone 25 MG tablet  Commonly known as:  ALDACTONE  Take 1 tablet (25 mg total) by mouth daily.  Start taking on:  12/20/2015     vitamin  B-12 1000 MCG tablet  Commonly known as:  CYANOCOBALAMIN  Take 1,000 mcg by mouth  daily.     warfarin 5 MG tablet  Commonly known as:  COUMADIN  TAKE AS DIRECTED BY ANTICOAGULATION CLINIC        Discharge Assessment: Filed Vitals:   12/18/15 0528 12/18/15 1317  BP: 96/59 124/69  Pulse: 54 86  Temp: 98.1 F (36.7 C) 98.6 F (37 C)  Resp: 18 19   Skin clean, dry and intact without evidence of skin break down, no evidence of skin tears noted. IV catheter discontinued intact. Site without signs and symptoms of complications - no redness or edema noted at insertion site, patient denies c/o pain - only slight tenderness at site.  Dressing with slight pressure applied.  D/c Instructions-Education: Discharge instructions given to patient/family with verbalized understanding. D/c education completed with patient/family including follow up instructions, medication list, d/c activities limitations if indicated, with other d/c instructions as indicated by MD - patient able to verbalize understanding, all questions fully answered. Patient instructed to return to ED, call 911, or call MD for any changes in condition.  Patient escorted via [place], and D/C home via private auto.  Salley Slaughter, RN 12/18/2015 3:47 PM

## 2015-12-18 NOTE — Progress Notes (Signed)
ANTICOAGULATION CONSULT NOTE - Follow Up Consult  Pharmacy Consult for Warfarin Indication: atrial fibrillation  Allergies  Allergen Reactions  . Codeine     REACTION: nausea    Patient Measurements: Height: 5\' 10"  (177.8 cm) Weight: 187 lb 2.7 oz (84.9 kg) IBW/kg (Calculated) : 68.5  Vital Signs: Temp: 98.1 F (36.7 C) (01/22 0528) Temp Source: Oral (01/22 0528) BP: 96/59 mmHg (01/22 0528) Pulse Rate: 54 (01/22 0528)  Labs:  Recent Labs  12/16/15 0540 12/16/15 1523 12/17/15 [id number] 12/18/15 0812  HGB 11.5*  --   --   --   HCT 35.9*  --   --   --   PLT 180  --   --   --   LABPROT  --  25.9* 31.0* 31.2*  INR  --  2.40* 3.05* 3.08*  CREATININE 0.69  --   --   --     Estimated Creatinine Clearance: 62.1 mL/min (by C-G formula based on Cr of 0.69).   Assessment: 82 year old female on chronic Coumadin for atrial fibrillation who was admitted on 12/15/2015 with for r/o sepsis. Pharmacy has been consulted to resume warfarin dosing this admit.  The patient is noted to have been on Flagyl for r/o CDiff from 1/19 - 1/21. This caused a rapid rise in the patient's INR up to 3.05. The patient's INR remains slightly elevated this morning despite holding the dose on 1/21 evening (INR 3.08 << 3.05). The patient's last dose of Flagyl was on 1/21 AM - will plan to resume warfarin tonight to prevent a drop in INR.   Goal of Therapy:  INR 2-3   Plan:  1. Warfarin 4 mg x 1 dose at 1800 today 2. Will continue to monitor for any signs/symptoms of bleeding and will follow up with PT/INR in the a.m.   Alycia Rossetti, PharmD, BCPS Clinical Pharmacist Pager: [phone] 12/18/2015 12:45 PM

## 2015-12-18 NOTE — Discharge Instructions (Signed)
Follow with Primary MD Laurey Morale, MD in 3 days   Get CBC, CMP, INR checked  by Primary MD next visit.    Activity: As tolerated with Full fall precautions use walker/cane & assistance as needed   Disposition Home    Diet:   Heart Healthy   For Heart failure patients - Check your Weight same time everyday, if you gain over 2 pounds, or you develop in leg swelling, experience more shortness of breath or chest pain, call your Primary MD immediately. Follow Cardiac Low Salt Diet and 1.5 lit/day fluid restriction.   On your next visit with your primary care physician please Get Medicines reviewed and adjusted.   Please request your Prim.MD to go over all Hospital Tests and Procedure/Radiological results at the follow up, please get all Hospital records sent to your Prim MD by signing hospital release before you go home.   If you experience worsening of your admission symptoms, develop shortness of breath, life threatening emergency, suicidal or homicidal thoughts you must seek medical attention immediately by calling 911 or calling your MD immediately  if symptoms less severe.  You Must read complete instructions/literature along with all the possible adverse reactions/side effects for all the Medicines you take and that have been prescribed to you. Take any new Medicines after you have completely understood and accpet all the possible adverse reactions/side effects.   Do not drive, operating heavy machinery, perform activities at heights, swimming or participation in water activities or provide baby sitting services if your were admitted for syncope or siezures until you have seen by Primary MD or a Neurologist and advised to do so again.  Do not drive when taking Pain medications.    Do not take more than prescribed Pain, Sleep and Anxiety Medications  Special Instructions: If you have smoked or chewed Tobacco  in the last 2 yrs please stop smoking, stop any regular Alcohol  and or  any Recreational drug use.  Wear Seat belts while driving.   Please note  You were cared for by a hospitalist during your hospital stay. If you have any questions about your discharge medications or the care you received while you were in the hospital after you are discharged, you can call the unit and asked to speak with the hospitalist on call if the hospitalist that took care of you is not available. Once you are discharged, your primary care physician will handle any further medical issues. Please note that NO REFILLS for any discharge medications will be authorized once you are discharged, as it is imperative that you return to your primary care physician (or establish a relationship with a primary care physician if you do not have one) for your aftercare needs so that they can reassess your need for medications and monitor your lab values.

## 2015-12-20 LAB — CULTURE, BLOOD (ROUTINE X 2)
CULTURE: NO GROWTH
Culture: NO GROWTH

## 2015-12-21 ENCOUNTER — Ambulatory Visit (INDEPENDENT_AMBULATORY_CARE_PROVIDER_SITE_OTHER): Payer: Medicare Other | Admitting: Family Medicine

## 2015-12-21 ENCOUNTER — Encounter: Payer: Self-pay | Admitting: Family Medicine

## 2015-12-21 ENCOUNTER — Ambulatory Visit (INDEPENDENT_AMBULATORY_CARE_PROVIDER_SITE_OTHER): Payer: Medicare Other | Admitting: Pharmacist

## 2015-12-21 VITALS — BP 119/74 | HR 102 | Temp 98.4°F | Ht 70.0 in | Wt 182.0 lb

## 2015-12-21 DIAGNOSIS — R197 Diarrhea, unspecified: Secondary | ICD-10-CM | POA: Diagnosis not present

## 2015-12-21 DIAGNOSIS — A419 Sepsis, unspecified organism: Secondary | ICD-10-CM | POA: Diagnosis not present

## 2015-12-21 DIAGNOSIS — N390 Urinary tract infection, site not specified: Secondary | ICD-10-CM

## 2015-12-21 DIAGNOSIS — I4891 Unspecified atrial fibrillation: Secondary | ICD-10-CM | POA: Diagnosis not present

## 2015-12-21 DIAGNOSIS — M14672 Charcot's joint, left ankle and foot: Secondary | ICD-10-CM

## 2015-12-21 DIAGNOSIS — Z7901 Long term (current) use of anticoagulants: Secondary | ICD-10-CM

## 2015-12-21 DIAGNOSIS — I480 Paroxysmal atrial fibrillation: Secondary | ICD-10-CM | POA: Diagnosis not present

## 2015-12-21 LAB — BASIC METABOLIC PANEL
BUN: 11 mg/dL (ref 6–23)
CHLORIDE: 101 meq/L (ref 96–112)
CO2: 31 meq/L (ref 19–32)
Calcium: 9.4 mg/dL (ref 8.4–10.5)
Creatinine, Ser: 0.81 mg/dL (ref 0.40–1.20)
GFR: 71.47 mL/min (ref >60.00)
GLUCOSE: 123 mg/dL — AB (ref 70–99)
POTASSIUM: 4.1 meq/L (ref 3.5–5.1)
SODIUM: 140 meq/L (ref 135–145)

## 2015-12-21 LAB — CBC WITH DIFFERENTIAL/PLATELET
BASOS PCT: 0.4 % (ref 0.0–3.0)
Basophils Absolute: 0 10*3/uL (ref 0.0–0.1)
EOS PCT: 2 % (ref 0.0–5.0)
Eosinophils Absolute: 0.2 10*3/uL (ref 0.0–0.7)
HCT: 41.2 % (ref 36.0–46.0)
Hemoglobin: 13.6 g/dL (ref 12.0–15.0)
LYMPHS ABS: 1.6 10*3/uL (ref 0.7–4.0)
Lymphocytes Relative: 14.4 % (ref 12.0–46.0)
MCHC: 33 g/dL (ref 30.0–36.0)
MCV: 93.7 fl (ref 78.0–100.0)
MONOS PCT: 6.3 % (ref 3.0–12.0)
Monocytes Absolute: 0.7 10*3/uL (ref 0.1–1.0)
NEUTROS ABS: 8.6 10*3/uL — AB (ref 1.4–7.7)
NEUTROS PCT: 76.9 % (ref 43.0–77.0)
Platelets: 305 10*3/uL (ref 150.0–400.0)
RBC: 4.4 Mil/uL (ref 3.87–5.11)
RDW: 13.4 % (ref 11.5–15.5)
WBC: 11.2 10*3/uL — ABNORMAL HIGH (ref 4.0–10.5)

## 2015-12-21 LAB — POCT INR: INR: 3.3

## 2015-12-21 MED ORDER — GABAPENTIN 300 MG PO CAPS: 300.0000 mg | ORAL_CAPSULE | Freq: Two times a day (BID) | ORAL | Status: DC

## 2015-12-21 NOTE — Progress Notes (Signed)
   Subjective:    Patient ID: [name], female    DOB: 1929-04-18, 82 y.o.   MRN: [id number]  HPI Here to follow up a hospital stay from 12-15-15 to 12-18-15 for what sounds like a bout of urosepsis. She describes feeling "bad" for several day prior to this but she cannot really be more specific. Then on the day of admission she suddenly started to have vomiting and diarrhea. Not much fever was seen. Blood cultures were negative but she grew Enterococcus from the urine. Her WBC on admission was 31.5 with a left shift, indicating a bacterial source of her infection. She responded to antibiotics and IV fluids, and since coming home she has felt much better. She now denies any nausea, no urinary or bowel complaints. Her appetite is good. She is scheduled to see Dr. Wylene Simmer this afternoon about her chronic left foot pain.    Review of Systems  Constitutional: Negative.   Respiratory: Negative.   Cardiovascular: Negative.   Gastrointestinal: Negative.   Genitourinary: Negative.   Neurological: Negative.        Objective:   Physical Exam  Constitutional: She is oriented to person, place, and time. She appears well-developed and well-nourished.  Neck: No thyromegaly present.  Cardiovascular: Normal rate, regular rhythm, normal heart sounds and intact distal pulses.   Pulmonary/Chest: Effort normal and breath sounds normal.  Abdominal: Soft. Bowel sounds are normal. She exhibits no distension and no mass. There is no tenderness. There is no rebound and no guarding.  Lymphadenopathy:    She has no cervical adenopathy.  Neurological: She is alert and oriented to person, place, and time.          Assessment & Plan:  She appears to have gotten over the UTI and her sepsis has resolved. We will check a CBC and a BMET today. She will see Dr. Doran Durand as above. She will get her INR check tomorrow.

## 2015-12-21 NOTE — Progress Notes (Signed)
Pre visit review using our clinic review tool, if applicable. No additional management support is needed unless otherwise documented below in the visit note. 

## 2016-01-04 ENCOUNTER — Ambulatory Visit (INDEPENDENT_AMBULATORY_CARE_PROVIDER_SITE_OTHER): Payer: Medicare Other | Admitting: *Deleted

## 2016-01-04 DIAGNOSIS — Z7901 Long term (current) use of anticoagulants: Secondary | ICD-10-CM | POA: Diagnosis not present

## 2016-01-04 DIAGNOSIS — I4891 Unspecified atrial fibrillation: Secondary | ICD-10-CM | POA: Diagnosis not present

## 2016-01-04 LAB — POCT INR: INR: 2

## 2016-01-06 DIAGNOSIS — M14672 Charcot's joint, left ankle and foot: Secondary | ICD-10-CM | POA: Diagnosis not present

## 2016-01-07 ENCOUNTER — Other Ambulatory Visit: Payer: Self-pay | Admitting: Cardiovascular Disease

## 2016-01-09 DIAGNOSIS — M14672 Charcot's joint, left ankle and foot: Secondary | ICD-10-CM | POA: Diagnosis not present

## 2016-01-19 DIAGNOSIS — H35351 Cystoid macular degeneration, right eye: Secondary | ICD-10-CM | POA: Diagnosis not present

## 2016-01-19 DIAGNOSIS — H35362 Drusen (degenerative) of macula, left eye: Secondary | ICD-10-CM | POA: Diagnosis not present

## 2016-01-19 DIAGNOSIS — H35371 Puckering of macula, right eye: Secondary | ICD-10-CM | POA: Diagnosis not present

## 2016-01-20 DIAGNOSIS — M14672 Charcot's joint, left ankle and foot: Secondary | ICD-10-CM | POA: Diagnosis not present

## 2016-01-25 ENCOUNTER — Ambulatory Visit (INDEPENDENT_AMBULATORY_CARE_PROVIDER_SITE_OTHER): Payer: Medicare Other | Admitting: *Deleted

## 2016-01-25 DIAGNOSIS — I4891 Unspecified atrial fibrillation: Secondary | ICD-10-CM | POA: Diagnosis not present

## 2016-01-25 DIAGNOSIS — Z7901 Long term (current) use of anticoagulants: Secondary | ICD-10-CM

## 2016-01-25 LAB — POCT INR: INR: 1.9

## 2016-02-13 DIAGNOSIS — M14672 Charcot's joint, left ankle and foot: Secondary | ICD-10-CM | POA: Diagnosis not present

## 2016-02-14 ENCOUNTER — Other Ambulatory Visit: Payer: Self-pay | Admitting: Cardiovascular Disease

## 2016-02-15 ENCOUNTER — Encounter (HOSPITAL_BASED_OUTPATIENT_CLINIC_OR_DEPARTMENT_OTHER): Payer: Self-pay | Admitting: Emergency Medicine

## 2016-02-15 ENCOUNTER — Emergency Department (HOSPITAL_BASED_OUTPATIENT_CLINIC_OR_DEPARTMENT_OTHER): Payer: Medicare Other

## 2016-02-15 ENCOUNTER — Emergency Department (HOSPITAL_BASED_OUTPATIENT_CLINIC_OR_DEPARTMENT_OTHER)
Admission: EM | Admit: 2016-02-15 | Discharge: 2016-02-16 | Disposition: A | Discharge status: Home / Self Care | Payer: Medicare Other | Source: Home / Self Care | Attending: Emergency Medicine | Admitting: Emergency Medicine

## 2016-02-15 ENCOUNTER — Ambulatory Visit (INDEPENDENT_AMBULATORY_CARE_PROVIDER_SITE_OTHER): Payer: Medicare Other | Admitting: *Deleted

## 2016-02-15 DIAGNOSIS — Z8542 Personal history of malignant neoplasm of other parts of uterus: Secondary | ICD-10-CM

## 2016-02-15 DIAGNOSIS — R05 Cough: Secondary | ICD-10-CM | POA: Diagnosis not present

## 2016-02-15 DIAGNOSIS — I272 Other secondary pulmonary hypertension: Secondary | ICD-10-CM | POA: Diagnosis not present

## 2016-02-15 DIAGNOSIS — I4891 Unspecified atrial fibrillation: Secondary | ICD-10-CM

## 2016-02-15 DIAGNOSIS — Z791 Long term (current) use of non-steroidal anti-inflammatories (NSAID): Secondary | ICD-10-CM

## 2016-02-15 DIAGNOSIS — M810 Age-related osteoporosis without current pathological fracture: Secondary | ICD-10-CM

## 2016-02-15 DIAGNOSIS — Z87440 Personal history of urinary (tract) infections: Secondary | ICD-10-CM

## 2016-02-15 DIAGNOSIS — G8929 Other chronic pain: Secondary | ICD-10-CM

## 2016-02-15 DIAGNOSIS — M1990 Unspecified osteoarthritis, unspecified site: Secondary | ICD-10-CM

## 2016-02-15 DIAGNOSIS — Z7901 Long term (current) use of anticoagulants: Secondary | ICD-10-CM

## 2016-02-15 DIAGNOSIS — E785 Hyperlipidemia, unspecified: Secondary | ICD-10-CM

## 2016-02-15 DIAGNOSIS — Z85038 Personal history of other malignant neoplasm of large intestine: Secondary | ICD-10-CM | POA: Insufficient documentation

## 2016-02-15 DIAGNOSIS — J069 Acute upper respiratory infection, unspecified: Secondary | ICD-10-CM | POA: Insufficient documentation

## 2016-02-15 DIAGNOSIS — E039 Hypothyroidism, unspecified: Secondary | ICD-10-CM

## 2016-02-15 DIAGNOSIS — F419 Anxiety disorder, unspecified: Secondary | ICD-10-CM

## 2016-02-15 DIAGNOSIS — I5033 Acute on chronic diastolic (congestive) heart failure: Secondary | ICD-10-CM | POA: Diagnosis not present

## 2016-02-15 DIAGNOSIS — Q211 Atrial septal defect: Secondary | ICD-10-CM | POA: Insufficient documentation

## 2016-02-15 DIAGNOSIS — R0602 Shortness of breath: Secondary | ICD-10-CM | POA: Diagnosis not present

## 2016-02-15 DIAGNOSIS — I10 Essential (primary) hypertension: Secondary | ICD-10-CM

## 2016-02-15 DIAGNOSIS — J9601 Acute respiratory failure with hypoxia: Secondary | ICD-10-CM | POA: Diagnosis not present

## 2016-02-15 DIAGNOSIS — J1000 Influenza due to other identified influenza virus with unspecified type of pneumonia: Secondary | ICD-10-CM | POA: Diagnosis not present

## 2016-02-15 DIAGNOSIS — E538 Deficiency of other specified B group vitamins: Secondary | ICD-10-CM

## 2016-02-15 DIAGNOSIS — G629 Polyneuropathy, unspecified: Secondary | ICD-10-CM

## 2016-02-15 DIAGNOSIS — Z86010 Personal history of colonic polyps: Secondary | ICD-10-CM

## 2016-02-15 DIAGNOSIS — Z79899 Other long term (current) drug therapy: Secondary | ICD-10-CM

## 2016-02-15 DIAGNOSIS — J189 Pneumonia, unspecified organism: Secondary | ICD-10-CM | POA: Diagnosis not present

## 2016-02-15 DIAGNOSIS — I959 Hypotension, unspecified: Secondary | ICD-10-CM | POA: Diagnosis not present

## 2016-02-15 LAB — POCT INR: INR: 1.8

## 2016-02-15 MED ORDER — BENZONATATE 100 MG PO CAPS
200.0000 mg | ORAL_CAPSULE | Freq: Once | ORAL | Status: AC
  Administered 2016-02-16: 200 mg via ORAL
  Filled 2016-02-15: qty 2

## 2016-02-15 MED ORDER — ACETAMINOPHEN 500 MG PO TABS
1000.0000 mg | ORAL_TABLET | Freq: Once | ORAL | Status: AC
  Administered 2016-02-16: 1000 mg via ORAL
  Filled 2016-02-15: qty 2

## 2016-02-15 NOTE — ED Notes (Signed)
MD at bedside. 

## 2016-02-15 NOTE — ED Provider Notes (Signed)
CSN: [id number]     Arrival date & time 02/15/16  2024 History  By signing my name below, I, Stephania Fragmin, attest that this documentation has been prepared under the direction and in the presence of Michaeljohn Biss, MD. Electronically Signed: Stephania Fragmin, ED Scribe. 02/15/2016. 11:48 PM.    Chief Complaint  Patient presents with  . URI   Patient is a 82 y.o. female presenting with cough. The history is provided by the patient. No language interpreter was used.  Cough Cough characteristics:  Dry Severity:  Moderate Onset quality:  Gradual Duration:  12 hours Timing:  Constant Progression:  Worsening Relieved by:  None tried Worsened by:  Nothing tried Ineffective treatments:  None tried Associated symptoms: chills, fever and shortness of breath   Associated symptoms: no rhinorrhea    HPI Comments: [name] is a 82 y.o. female with a history of hypertension and atrial fibrillation (currently anticoagulated on Coumadin), who presents to the Emergency Department with multiple complaints, including a non-productive cough, that began this morning. She also complains of an associated low-grade fever, chills, SOB, and generalized weakness. Patient states she has not tried any treatments or medications for her symptoms. She denies vomiting, diarrhea, nasal congestion, or rhinorrhea.  Past Medical History  Diagnosis Date  . Hypertension   . Chest pain, unspecified   . Atrial fibrillation ([place])   . Mitral valve disorder   . Patent foramen ovale   . Peripheral vascular disease ([place])   . Venous insufficiency   . Hyperlipidemia   . Impaired glucose tolerance   . Hypothyroid   . Diverticulosis of colon   . History of colonic polyps   . History of colon cancer   . Splenic infarction   . History of uterine cancer   . UTI (lower urinary tract infection)   . Osteoarthritis   . Chronic low back pain   . Osteoporosis   . Peripheral neuropathy ([place])   . Anxiety   . Vitamin B12 deficiency    . History of colonoscopy   . Cataract     REMOVED BILATERAL   Past Surgical History  Procedure Laterality Date  . Hysterectomy for uterine cancer    . Anterior and posterior vaginal repair  03/2006    Dr.Neal  . Basal cell skin cancer moh's surgery    . Right hemicolectomy for colon cancer  1994  . Lumbar laminectomy for spinal stenosis  2006    L3-4  . Left carpal tunnel surgery  02/2007    Dr. Daylene Katayama  . Retinal detachment repair w/ scleral buckle le Right 03-01-14    per Dr. Zadie Rhine   . Colonoscopy  05-25-10    per Dr. Henrene Pastor, benign polyps, repeat in 5 yrs   . Eye surgery    . Cataract extraction, bilateral     Family History  Problem Relation Age of Onset  . Cancer Mother     deceased age 55  . Heart failure Father     deceased age 58  . Cancer Brother     deceased age 75  . Stroke Sister     and heart problems; deceased age 41  . Heart disease Sister    Social History  Substance Use Topics  . Smoking status: Never Smoker   . Smokeless tobacco: Never Used  . Alcohol Use: 0.0 oz/week    0 Standard drinks or equivalent per week     Comment: occ   OB History    No  data available     Review of Systems  Constitutional: Positive for fever and chills.  HENT: Negative for congestion and rhinorrhea.   Respiratory: Positive for cough and shortness of breath.   Gastrointestinal: Negative for vomiting and diarrhea.  Neurological: Positive for weakness (generalized).  All other systems reviewed and are negative.   Allergies  Codeine  Home Medications   Prior to Admission medications   Medication Sig Start Date End Date Taking? Authorizing Provider  ALPRAZolam (XANAX) 0.25 MG tablet Take 1 tablet (0.25 mg total) by mouth at bedtime as needed for sleep. 04/19/15   Laurey Morale, MD  atorvastatin (LIPITOR) 20 MG tablet Take 20 mg by mouth daily. 09/07/15   Historical Provider, MD  Calcium Carbonate-Vitamin D (CALTRATE 600+D) 600-400 MG-UNIT per tablet Take 1 tablet by  mouth daily.      Historical Provider, MD  carvedilol (COREG) 6.25 MG tablet TAKE 1 TABLET TWICE A DAY 11/25/15   Josue Hector, MD  estradiol (CLIMARA - DOSED IN MG/24 HR) 0.1 mg/24hr Place 1 patch onto the skin once a week. Nicut    Historical Provider, MD  furosemide (LASIX) 40 MG tablet Take 1 tablet (40 mg total) by mouth daily. 12/20/15   Thurnell Lose, MD  gabapentin (NEURONTIN) 300 MG capsule Take 1 capsule (300 mg total) by mouth 2 (two) times daily. 12/21/15   Laurey Morale, MD  HYDROcodone-acetaminophen (NORCO/VICODIN) 5-325 MG tablet Take 1 tablet by mouth every 6 (six) hours as needed for moderate pain.  12/14/15   Historical Provider, MD  ketorolac (ACULAR) 0.5 % ophthalmic solution Place 1 drop into the right eye 2 (two) times daily. 03/17/15   Historical Provider, MD  levothyroxine (SYNTHROID, LEVOTHROID) 125 MCG tablet Take 1 tablet (125 mcg total) by mouth daily. 02/01/15   Josue Hector, MD  loperamide (IMODIUM A-D) 2 MG tablet Take 2 mg by mouth 4 (four) times daily as needed for diarrhea or loose stools.    Historical Provider, MD  meclizine (ANTIVERT) 25 MG tablet TAKE 1/2 OR 1 TABLET BY MOUTH EVERY 6 HOURS AS NEEDED FOR DIZZINESS 04/19/15   Laurey Morale, MD  Multiple Vitamins-Minerals (CENTRUM SILVER PO) Take 1 tablet by mouth daily.      Historical Provider, MD  potassium chloride SA (K-DUR,KLOR-CON) 20 MEQ tablet Take 1 tablet (20 mEq total) by mouth 2 (two) times daily. 09/12/15   Josue Hector, MD  quinapril (ACCUPRIL) 20 MG tablet Take 1 tablet (20 mg total) by mouth daily. 12/20/15   Thurnell Lose, MD  spironolactone (ALDACTONE) 25 MG tablet Take 1 tablet (25 mg total) by mouth daily. 12/20/15   Thurnell Lose, MD  vitamin B-12 (CYANOCOBALAMIN) 1000 MCG tablet Take 1,000 mcg by mouth daily.      Historical Provider, MD  warfarin (COUMADIN) 5 MG tablet TAKE AS DIRECTED BY ANTICOAGULATION CLINIC Patient taking differently: No sig reported 10/27/15   Josue Hector, MD    BP 131/88 mmHg  Pulse 68  Temp(Src) 99.9 F (37.7 C) (Oral)  Resp 18  Ht 5\' 11"  (1.803 m)  Wt 180 lb (81.647 kg)  BMI 25.12 kg/m2  SpO2 98% Physical Exam  HENT:  Mouth/Throat: Oropharynx is clear and moist. No oropharyngeal exudate.  Moist mucous membranes.  Eyes: Conjunctivae and EOM are normal. Pupils are equal, round, and reactive to light.  Neck: Neck supple. Carotid bruit is not present.  No cervical or supraclavicular lymphadenopathy. No bruits.   Cardiovascular:  Normal rate, regular rhythm and normal heart sounds.   Pulmonary/Chest: Effort normal and breath sounds normal. No respiratory distress. She has no wheezes. She has no rales.  Lungs are clear to auscultation. No stridor.  Abdominal: Soft. Bowel sounds are normal. There is no tenderness. There is no rebound and no guarding.  Good bowel sounds.  Musculoskeletal:  All compartments soft in BLE.  Lymphadenopathy:    She has no cervical adenopathy.       Right: No supraclavicular adenopathy present.       Left: No supraclavicular adenopathy present.  Neurological: She has normal reflexes. No cranial nerve deficit.  Nursing note and vitals reviewed.   ED Course  Procedures (including critical care time)  DIAGNOSTIC STUDIES: Oxygen Saturation is 98% on RA, normal by my interpretation.    COORDINATION OF CARE: 11:45 PM - CXR results (negative) reviewed with pt. Suspect viral URI. However, will obtain UA to r/o UTI. Discussed treatment plan with pt at bedside which includes symptomatic treatment, including hydration and Tylenol for fever control. Will discharge with Rx cough suppressant. Pt verbalized understanding and agreed to plan.   Labs Review Labs Reviewed  URINALYSIS, ROUTINE W REFLEX MICROSCOPIC (NOT AT [facility])    Imaging Review Dg Chest 2 View  02/15/2016  CLINICAL DATA:  Cough and congestion. Chills in shortness of breath. EXAM: CHEST  2 VIEW COMPARISON:  12/15/2015 FINDINGS: Enlargement of the cardiac  silhouette is unchanged. Thoracic aortic atherosclerosis is noted. The lungs remain hyperinflated with similar appearance of chronic interstitial changes suggestive of COPD. No acute airspace consolidation, edema, pleural effusion, or pneumothorax is identified. The no acute osseous abnormality is identified. IMPRESSION: Unchanged appearance of the chest without evidence of acute cardiopulmonary process. Electronically Signed   By: Logan Bores M.D.   On: 02/15/2016 22:23   I have personally reviewed and evaluated these images and lab results as part of my medical decision-making.   EKG Interpretation None      MDM   Final diagnoses:  None   URI: well appearing CXR is normal.  She has had ongoing symptoms since Monday.  Will treat symptomatically with Tylenol and tessalon.   Follow up with your PMD for recheck this week.  Strict return precautions given   I personally performed the services described in this documentation, which was scribed in my presence. The recorded information has been reviewed and is accurate.       Veatrice Kells, MD 02/16/16 [phone]

## 2016-02-15 NOTE — ED Notes (Signed)
Pt reports new onset of non productive cough with chills since this morning. Denies pain or any other symptoms. Alert and Oriented.

## 2016-02-16 LAB — URINALYSIS, ROUTINE W REFLEX MICROSCOPIC
Bilirubin Urine: NEGATIVE
Glucose, UA: NEGATIVE mg/dL
HGB URINE DIPSTICK: NEGATIVE
Ketones, ur: NEGATIVE mg/dL
LEUKOCYTES UA: NEGATIVE
NITRITE: NEGATIVE
PROTEIN: NEGATIVE mg/dL
SPECIFIC GRAVITY, URINE: 1.015 (ref 1.005–1.030)
pH: 7.5 (ref 5.0–8.0)

## 2016-02-16 MED ORDER — BENZONATATE 100 MG PO CAPS: 100.0000 mg | ORAL_CAPSULE | Freq: Three times a day (TID) | ORAL | Status: DC

## 2016-02-16 NOTE — Discharge Instructions (Signed)
Upper Respiratory Infection, Adult Most upper respiratory infections (URIs) are a viral infection of the air passages leading to the lungs. A URI affects the nose, throat, and upper air passages. The most common type of URI is nasopharyngitis and is typically referred to as "the common cold." URIs run their course and usually go away on their own. Most of the time, a URI does not require medical attention, but sometimes a bacterial infection in the upper airways can follow a viral infection. This is called a secondary infection. Sinus and middle ear infections are common types of secondary upper respiratory infections. Bacterial pneumonia can also complicate a URI. A URI can worsen asthma and chronic obstructive pulmonary disease (COPD). Sometimes, these complications can require emergency medical care and may be life threatening.  CAUSES Almost all URIs are caused by viruses. A virus is a type of germ and can spread from one person to another.  RISKS FACTORS You may be at risk for a URI if:   You smoke.   You have chronic heart or lung disease.  You have a weakened defense (immune) system.   You are very young or very old.   You have nasal allergies or asthma.  You work in crowded or poorly ventilated areas.  You work in health care facilities or schools. SIGNS AND SYMPTOMS  Symptoms typically develop 2-3 days after you come in contact with a cold virus. Most viral URIs last 7-10 days. However, viral URIs from the influenza virus (flu virus) can last 14-18 days and are typically more severe. Symptoms may include:   Runny or stuffy (congested) nose.   Sneezing.   Cough.   Sore throat.   Headache.   Fatigue.   Fever.   Loss of appetite.   Pain in your forehead, behind your eyes, and over your cheekbones (sinus pain).  Muscle aches.  DIAGNOSIS  Your health care provider may diagnose a URI by:  Physical exam.  Tests to check that your symptoms are not due to  another condition such as:  Strep throat.  Sinusitis.  Pneumonia.  Asthma. TREATMENT  A URI goes away on its own with time. It cannot be cured with medicines, but medicines may be prescribed or recommended to relieve symptoms. Medicines may help:  Reduce your fever.  Reduce your cough.  Relieve nasal congestion. HOME CARE INSTRUCTIONS   Take medicines only as directed by your health care provider.   Gargle warm saltwater or take cough drops to comfort your throat as directed by your health care provider.  Use a warm mist humidifier or inhale steam from a shower to increase air moisture. This may make it easier to breathe.  Drink enough fluid to keep your urine clear or pale yellow.   Eat soups and other clear broths and maintain good nutrition.   Rest as needed.   Return to work when your temperature has returned to normal or as your health care provider advises. You may need to stay home longer to avoid infecting others. You can also use a face mask and careful hand washing to prevent spread of the virus.  Increase the usage of your inhaler if you have asthma.   Do not use any tobacco products, including cigarettes, chewing tobacco, or electronic cigarettes. If you need help quitting, ask your health care provider. PREVENTION  The best way to protect yourself from getting a cold is to practice good hygiene.   Avoid oral or hand contact with people with cold  symptoms.   °· Wash your hands often if contact occurs.   °There is no clear evidence that vitamin C, vitamin E, echinacea, or exercise reduces the chance of developing a cold. However, it is always recommended to get plenty of rest, exercise, and practice good nutrition.  °SEEK MEDICAL CARE IF:  °· You are getting worse rather than better.   °· Your symptoms are not controlled by medicine.   °· You have chills. °· You have worsening shortness of breath. °· You have brown or red mucus. °· You have yellow or brown nasal  discharge. °· You have pain in your face, especially when you bend forward. °· You have a fever. °· You have swollen neck glands. °· You have pain while swallowing. °· You have white areas in the back of your throat. °SEEK IMMEDIATE MEDICAL CARE IF:  °· You have severe or persistent: °¨ Headache. °¨ Ear pain. °¨ Sinus pain. °¨ Chest pain. °· You have chronic lung disease and any of the following: °¨ Wheezing. °¨ Prolonged cough. °¨ Coughing up blood. °¨ A change in your usual mucus. °· You have a stiff neck. °· You have changes in your: °¨ Vision. °¨ Hearing. °¨ Thinking. °¨ Mood. °MAKE SURE YOU:  °· Understand these instructions. °· Will watch your condition. °· Will get help right away if you are not doing well or get worse. °  °This information is not intended to replace advice given to you by your health care provider. Make sure you discuss any questions you have with your health care provider. °  °Document Released: 05/08/2001 Document Revised: 03/29/2015 Document Reviewed: 02/17/2014 °Elsevier Interactive Patient Education ©2016 [employer] Inc[employer] ° °Cool Mist Vaporizers °Vaporizers may help relieve the symptoms of a cough and cold. They add moisture to the air, which helps mucus to become thinner and less sticky. This makes it easier to breathe and cough up secretions. Cool mist vaporizers do not cause serious burns like hot mist vaporizers, which may also be called steamers or humidifiers. Vaporizers have not been proven to help with colds. You should not use a vaporizer if you are allergic to mold. °HOME CARE INSTRUCTIONS °· Follow the package instructions for the vaporizer. °· Do not use anything other than distilled water in the vaporizer. °· Do not run the vaporizer all of the time. This can cause mold or bacteria to grow in the vaporizer. °· Clean the vaporizer after each time it is used. °· Clean and dry the vaporizer well before storing it. °· Stop using the vaporizer if worsening respiratory symptoms  develop. °  °This information is not intended to replace advice given to you by your health care provider. Make sure you discuss any questions you have with your health care provider. °  °Document Released: 08/09/2004 Document Revised: 11/17/2013 Document Reviewed: 04/01/2013 °Elsevier Interactive Patient Education ©2016 Elsevier Inc. ° °

## 2016-02-16 NOTE — ED Notes (Signed)
Pt and family verbalize understanding of dc instructions and deny any further needs at this time 

## 2016-02-17 ENCOUNTER — Encounter (HOSPITAL_BASED_OUTPATIENT_CLINIC_OR_DEPARTMENT_OTHER): Payer: Self-pay

## 2016-02-17 ENCOUNTER — Inpatient Hospital Stay (HOSPITAL_BASED_OUTPATIENT_CLINIC_OR_DEPARTMENT_OTHER)
Admission: EM | Admit: 2016-02-17 | Discharge: 2016-02-21 | DRG: 193 | Disposition: A | Discharge status: Home / Self Care | Payer: Medicare Other | Attending: Internal Medicine | Admitting: Internal Medicine

## 2016-02-17 ENCOUNTER — Emergency Department (HOSPITAL_BASED_OUTPATIENT_CLINIC_OR_DEPARTMENT_OTHER): Payer: Medicare Other

## 2016-02-17 DIAGNOSIS — M1990 Unspecified osteoarthritis, unspecified site: Secondary | ICD-10-CM | POA: Diagnosis present

## 2016-02-17 DIAGNOSIS — R0602 Shortness of breath: Secondary | ICD-10-CM | POA: Diagnosis not present

## 2016-02-17 DIAGNOSIS — I5031 Acute diastolic (congestive) heart failure: Secondary | ICD-10-CM | POA: Insufficient documentation

## 2016-02-17 DIAGNOSIS — M545 Low back pain: Secondary | ICD-10-CM | POA: Diagnosis present

## 2016-02-17 DIAGNOSIS — E039 Hypothyroidism, unspecified: Secondary | ICD-10-CM | POA: Diagnosis present

## 2016-02-17 DIAGNOSIS — Q211 Atrial septal defect: Secondary | ICD-10-CM | POA: Diagnosis not present

## 2016-02-17 DIAGNOSIS — Z8542 Personal history of malignant neoplasm of other parts of uterus: Secondary | ICD-10-CM

## 2016-02-17 DIAGNOSIS — I10 Essential (primary) hypertension: Secondary | ICD-10-CM | POA: Diagnosis present

## 2016-02-17 DIAGNOSIS — G629 Polyneuropathy, unspecified: Secondary | ICD-10-CM | POA: Diagnosis present

## 2016-02-17 DIAGNOSIS — I350 Nonrheumatic aortic (valve) stenosis: Secondary | ICD-10-CM

## 2016-02-17 DIAGNOSIS — F419 Anxiety disorder, unspecified: Secondary | ICD-10-CM | POA: Diagnosis present

## 2016-02-17 DIAGNOSIS — I272 Other secondary pulmonary hypertension: Secondary | ICD-10-CM | POA: Diagnosis present

## 2016-02-17 DIAGNOSIS — I509 Heart failure, unspecified: Secondary | ICD-10-CM

## 2016-02-17 DIAGNOSIS — M810 Age-related osteoporosis without current pathological fracture: Secondary | ICD-10-CM | POA: Diagnosis present

## 2016-02-17 DIAGNOSIS — Z66 Do not resuscitate: Secondary | ICD-10-CM | POA: Diagnosis present

## 2016-02-17 DIAGNOSIS — J9601 Acute respiratory failure with hypoxia: Secondary | ICD-10-CM | POA: Diagnosis present

## 2016-02-17 DIAGNOSIS — I482 Chronic atrial fibrillation: Secondary | ICD-10-CM

## 2016-02-17 DIAGNOSIS — J9602 Acute respiratory failure with hypercapnia: Secondary | ICD-10-CM | POA: Diagnosis present

## 2016-02-17 DIAGNOSIS — I739 Peripheral vascular disease, unspecified: Secondary | ICD-10-CM | POA: Diagnosis present

## 2016-02-17 DIAGNOSIS — Z85038 Personal history of other malignant neoplasm of large intestine: Secondary | ICD-10-CM

## 2016-02-17 DIAGNOSIS — I080 Rheumatic disorders of both mitral and aortic valves: Secondary | ICD-10-CM | POA: Diagnosis present

## 2016-02-17 DIAGNOSIS — E785 Hyperlipidemia, unspecified: Secondary | ICD-10-CM | POA: Diagnosis present

## 2016-02-17 DIAGNOSIS — R05 Cough: Secondary | ICD-10-CM | POA: Diagnosis not present

## 2016-02-17 DIAGNOSIS — G8929 Other chronic pain: Secondary | ICD-10-CM | POA: Diagnosis present

## 2016-02-17 DIAGNOSIS — J1000 Influenza due to other identified influenza virus with unspecified type of pneumonia: Secondary | ICD-10-CM | POA: Diagnosis present

## 2016-02-17 DIAGNOSIS — I5033 Acute on chronic diastolic (congestive) heart failure: Secondary | ICD-10-CM | POA: Diagnosis present

## 2016-02-17 DIAGNOSIS — J189 Pneumonia, unspecified organism: Secondary | ICD-10-CM | POA: Diagnosis not present

## 2016-02-17 DIAGNOSIS — I4891 Unspecified atrial fibrillation: Secondary | ICD-10-CM | POA: Diagnosis present

## 2016-02-17 DIAGNOSIS — Z9842 Cataract extraction status, left eye: Secondary | ICD-10-CM | POA: Diagnosis not present

## 2016-02-17 DIAGNOSIS — I110 Hypertensive heart disease with heart failure: Secondary | ICD-10-CM | POA: Diagnosis present

## 2016-02-17 DIAGNOSIS — E860 Dehydration: Secondary | ICD-10-CM | POA: Diagnosis present

## 2016-02-17 DIAGNOSIS — I959 Hypotension, unspecified: Secondary | ICD-10-CM | POA: Diagnosis present

## 2016-02-17 DIAGNOSIS — Z7901 Long term (current) use of anticoagulants: Secondary | ICD-10-CM

## 2016-02-17 DIAGNOSIS — R918 Other nonspecific abnormal finding of lung field: Secondary | ICD-10-CM | POA: Diagnosis not present

## 2016-02-17 DIAGNOSIS — Z9841 Cataract extraction status, right eye: Secondary | ICD-10-CM

## 2016-02-17 DIAGNOSIS — R0603 Acute respiratory distress: Secondary | ICD-10-CM

## 2016-02-17 DIAGNOSIS — E538 Deficiency of other specified B group vitamins: Secondary | ICD-10-CM | POA: Diagnosis present

## 2016-02-17 DIAGNOSIS — Z85828 Personal history of other malignant neoplasm of skin: Secondary | ICD-10-CM | POA: Diagnosis not present

## 2016-02-17 DIAGNOSIS — J111 Influenza due to unidentified influenza virus with other respiratory manifestations: Secondary | ICD-10-CM | POA: Diagnosis not present

## 2016-02-17 LAB — CBC WITH DIFFERENTIAL/PLATELET
BASOS ABS: 0 10*3/uL (ref 0.0–0.1)
Basophils Relative: 0 %
EOS ABS: 0 10*3/uL (ref 0.0–0.7)
EOS PCT: 0 %
HCT: 36.8 % (ref 36.0–46.0)
Hemoglobin: 11.8 g/dL — ABNORMAL LOW (ref 12.0–15.0)
LYMPHS PCT: 4 %
Lymphs Abs: 0.4 10*3/uL — ABNORMAL LOW (ref 0.7–4.0)
MCH: 30.4 pg (ref 26.0–34.0)
MCHC: 32.1 g/dL (ref 30.0–36.0)
MCV: 94.8 fL (ref 78.0–100.0)
Monocytes Absolute: 0.6 10*3/uL (ref 0.1–1.0)
Monocytes Relative: 7 %
Neutro Abs: 8 10*3/uL — ABNORMAL HIGH (ref 1.7–7.7)
Neutrophils Relative %: 89 %
PLATELETS: 236 10*3/uL (ref 150–400)
RBC: 3.88 MIL/uL (ref 3.87–5.11)
RDW: 13.3 % (ref 11.5–15.5)
WBC: 9.1 10*3/uL (ref 4.0–10.5)

## 2016-02-17 LAB — URINALYSIS, ROUTINE W REFLEX MICROSCOPIC
BILIRUBIN URINE: NEGATIVE
Glucose, UA: NEGATIVE mg/dL
Hgb urine dipstick: NEGATIVE
KETONES UR: 15 mg/dL — AB
LEUKOCYTES UA: NEGATIVE
NITRITE: NEGATIVE
PROTEIN: NEGATIVE mg/dL
Specific Gravity, Urine: 1.021 (ref 1.005–1.030)
pH: 6.5 (ref 5.0–8.0)

## 2016-02-17 LAB — MRSA PCR SCREENING: MRSA by PCR: NEGATIVE

## 2016-02-17 LAB — BRAIN NATRIURETIC PEPTIDE: B Natriuretic Peptide: 198.3 pg/mL — ABNORMAL HIGH (ref 0.0–100.0)

## 2016-02-17 LAB — TSH: TSH: 0.293 u[IU]/mL — ABNORMAL LOW (ref 0.350–4.500)

## 2016-02-17 LAB — I-STAT CG4 LACTIC ACID, ED: Lactic Acid, Venous: 0.93 mmol/L (ref 0.5–2.0)

## 2016-02-17 LAB — COMPREHENSIVE METABOLIC PANEL
ALT: 17 U/L (ref 14–54)
AST: 23 U/L (ref 15–41)
Albumin: 4 g/dL (ref 3.5–5.0)
Alkaline Phosphatase: 70 U/L (ref 38–126)
Anion gap: 10 (ref 5–15)
BILIRUBIN TOTAL: 1.5 mg/dL — AB (ref 0.3–1.2)
BUN: 21 mg/dL — AB (ref 6–20)
CO2: 22 mmol/L (ref 22–32)
Calcium: 8.7 mg/dL — ABNORMAL LOW (ref 8.9–10.3)
Chloride: 102 mmol/L (ref 101–111)
Creatinine, Ser: 0.99 mg/dL (ref 0.44–1.00)
GFR, EST AFRICAN AMERICAN: 59 mL/min — AB (ref >60)
GFR, EST NON AFRICAN AMERICAN: 51 mL/min — AB (ref >60)
Glucose, Bld: 156 mg/dL — ABNORMAL HIGH (ref 65–99)
POTASSIUM: 4.5 mmol/L (ref 3.5–5.1)
Sodium: 134 mmol/L — ABNORMAL LOW (ref 135–145)
TOTAL PROTEIN: 6.9 g/dL (ref 6.5–8.1)

## 2016-02-17 LAB — INFLUENZA PANEL BY PCR (TYPE A & B)
H1N1FLUPCR: NOT DETECTED
Influenza A By PCR: POSITIVE — AB
Influenza B By PCR: NEGATIVE

## 2016-02-17 LAB — PROTIME-INR
INR: 2.11 — AB (ref 0.00–1.49)
INR: 2.47 — ABNORMAL HIGH (ref 0.00–1.49)
PROTHROMBIN TIME: 23.5 s — AB (ref 11.6–15.2)
Prothrombin Time: 26.5 seconds — ABNORMAL HIGH (ref 11.6–15.2)

## 2016-02-17 LAB — TROPONIN I: Troponin I: 0.03 ng/mL (ref <0.031)

## 2016-02-17 MED ORDER — ACETAMINOPHEN 325 MG PO TABS
650.0000 mg | ORAL_TABLET | Freq: Four times a day (QID) | ORAL | Status: DC | PRN
  Administered 2016-02-17 – 2016-02-19 (×4): 650 mg via ORAL
  Filled 2016-02-17 (×4): qty 2

## 2016-02-17 MED ORDER — OSELTAMIVIR PHOSPHATE 30 MG PO CAPS
30.0000 mg | ORAL_CAPSULE | Freq: Two times a day (BID) | ORAL | Status: DC
  Administered 2016-02-17 – 2016-02-21 (×8): 30 mg via ORAL
  Filled 2016-02-17 (×10): qty 1

## 2016-02-17 MED ORDER — HYDROCODONE-ACETAMINOPHEN 5-325 MG PO TABS: 1.0000 | ORAL_TABLET | ORAL | Status: DC | PRN

## 2016-02-17 MED ORDER — VANCOMYCIN HCL 500 MG IV SOLR
INTRAVENOUS | Status: AC
  Filled 2016-02-17: qty 1500

## 2016-02-17 MED ORDER — SODIUM CHLORIDE 0.9 % IV BOLUS (SEPSIS)
1000.0000 mL | INTRAVENOUS | Status: AC
  Administered 2016-02-17 (×2): 1000 mL via INTRAVENOUS

## 2016-02-17 MED ORDER — AMIODARONE LOAD VIA INFUSION
150.0000 mg | Freq: Once | INTRAVENOUS | Status: AC
  Administered 2016-02-17: 150 mg via INTRAVENOUS
  Filled 2016-02-17: qty 83.34

## 2016-02-17 MED ORDER — ONDANSETRON HCL 4 MG PO TABS: 4.0000 mg | ORAL_TABLET | Freq: Four times a day (QID) | ORAL | Status: DC | PRN

## 2016-02-17 MED ORDER — ACETAMINOPHEN 500 MG PO TABS: 1000.0000 mg | ORAL_TABLET | Freq: Once | ORAL | Status: DC

## 2016-02-17 MED ORDER — HEPARIN (PORCINE) IN NACL 100-0.45 UNIT/ML-% IJ SOLN
1000.0000 [IU]/h | INTRAMUSCULAR | Status: DC
  Administered 2016-02-17: 1000 [IU]/h via INTRAVENOUS
  Filled 2016-02-17: qty 250

## 2016-02-17 MED ORDER — DEXTROSE 5 % IV SOLN
1.0000 g | INTRAVENOUS | Status: DC
  Administered 2016-02-17: 1 g via INTRAVENOUS
  Filled 2016-02-17 (×2): qty 10

## 2016-02-17 MED ORDER — OSELTAMIVIR PHOSPHATE 75 MG PO CAPS: 75.0000 mg | ORAL_CAPSULE | Freq: Two times a day (BID) | ORAL | Status: DC

## 2016-02-17 MED ORDER — POTASSIUM CHLORIDE CRYS ER 20 MEQ PO TBCR
20.0000 meq | EXTENDED_RELEASE_TABLET | Freq: Two times a day (BID) | ORAL | Status: DC
  Administered 2016-02-17 – 2016-02-21 (×8): 20 meq via ORAL
  Filled 2016-02-17 (×8): qty 1

## 2016-02-17 MED ORDER — SODIUM CHLORIDE 0.9% FLUSH
3.0000 mL | Freq: Two times a day (BID) | INTRAVENOUS | Status: DC
  Administered 2016-02-18 – 2016-02-20 (×6): 3 mL via INTRAVENOUS

## 2016-02-17 MED ORDER — KETOROLAC TROMETHAMINE 0.5 % OP SOLN
1.0000 [drp] | Freq: Two times a day (BID) | OPHTHALMIC | Status: DC
Start: 2016-02-17 — End: 2016-02-21
  Administered 2016-02-17 – 2016-02-21 (×8): 1 [drp] via OPHTHALMIC
  Filled 2016-02-17 (×2): qty 3

## 2016-02-17 MED ORDER — WHITE PETROLATUM GEL
Status: AC
  Administered 2016-02-17: 19:00:00
  Filled 2016-02-17: qty 1

## 2016-02-17 MED ORDER — ALPRAZOLAM 0.25 MG PO TABS
0.2500 mg | ORAL_TABLET | Freq: Every evening | ORAL | Status: DC | PRN
  Administered 2016-02-17 – 2016-02-18 (×2): 0.25 mg via ORAL
  Filled 2016-02-17 (×2): qty 1

## 2016-02-17 MED ORDER — METOPROLOL TARTRATE 1 MG/ML IV SOLN
5.0000 mg | Freq: Once | INTRAVENOUS | Status: AC
  Administered 2016-02-17: 5 mg via INTRAVENOUS
  Filled 2016-02-17: qty 5

## 2016-02-17 MED ORDER — FUROSEMIDE 40 MG PO TABS
40.0000 mg | ORAL_TABLET | Freq: Every day | ORAL | Status: DC
  Administered 2016-02-17 – 2016-02-21 (×5): 40 mg via ORAL
  Filled 2016-02-17 (×5): qty 1

## 2016-02-17 MED ORDER — DEXTROSE 5 % IV SOLN
2.0000 g | Freq: Once | INTRAVENOUS | Status: AC
  Administered 2016-02-17: 2 g via INTRAVENOUS

## 2016-02-17 MED ORDER — VANCOMYCIN HCL 10 G IV SOLR
1500.0000 mg | Freq: Once | INTRAVENOUS | Status: AC
  Administered 2016-02-17: 1500 mg via INTRAVENOUS
  Filled 2016-02-17: qty 1500

## 2016-02-17 MED ORDER — DEXTROSE 5 % IV SOLN
500.0000 mg | INTRAVENOUS | Status: DC
Start: 2016-02-17 — End: 2016-02-18
  Administered 2016-02-17: 500 mg via INTRAVENOUS
  Filled 2016-02-17 (×2): qty 500

## 2016-02-17 MED ORDER — SODIUM CHLORIDE 0.9 % IV BOLUS (SEPSIS): 1000.0000 mL | Freq: Once | INTRAVENOUS | Status: DC

## 2016-02-17 MED ORDER — BENZONATATE 100 MG PO CAPS
100.0000 mg | ORAL_CAPSULE | Freq: Three times a day (TID) | ORAL | Status: DC
  Administered 2016-02-17 – 2016-02-21 (×12): 100 mg via ORAL
  Filled 2016-02-17 (×12): qty 1

## 2016-02-17 MED ORDER — ACETAMINOPHEN 500 MG PO TABS
1000.0000 mg | ORAL_TABLET | Freq: Once | ORAL | Status: AC | PRN
  Administered 2016-02-17: 1000 mg via ORAL
  Filled 2016-02-17 (×2): qty 2

## 2016-02-17 MED ORDER — CARVEDILOL 6.25 MG PO TABS
6.2500 mg | ORAL_TABLET | Freq: Two times a day (BID) | ORAL | Status: DC
  Filled 2016-02-17: qty 1

## 2016-02-17 MED ORDER — SODIUM CHLORIDE 0.9 % IV BOLUS (SEPSIS): 500.0000 mL | INTRAVENOUS | Status: DC

## 2016-02-17 MED ORDER — BISACODYL 10 MG RE SUPP: 10.0000 mg | Freq: Every day | RECTAL | Status: DC | PRN

## 2016-02-17 MED ORDER — ONDANSETRON HCL 4 MG/2ML IJ SOLN
4.0000 mg | Freq: Four times a day (QID) | INTRAMUSCULAR | Status: DC | PRN
  Administered 2016-02-17: 4 mg via INTRAVENOUS
  Filled 2016-02-17: qty 2

## 2016-02-17 MED ORDER — FUROSEMIDE 10 MG/ML IJ SOLN
20.0000 mg | Freq: Once | INTRAMUSCULAR | Status: AC
  Administered 2016-02-17: 20 mg via INTRAVENOUS
  Filled 2016-02-17: qty 2

## 2016-02-17 MED ORDER — CEFEPIME HCL 2 G IJ SOLR
INTRAMUSCULAR | Status: AC
Start: 2016-02-17 — End: 2016-02-17
  Filled 2016-02-17: qty 2

## 2016-02-17 MED ORDER — SODIUM CHLORIDE 0.9 % IV SOLN: INTRAVENOUS | Status: DC

## 2016-02-17 MED ORDER — VITAMIN B-12 1000 MCG PO TABS
1000.0000 ug | ORAL_TABLET | Freq: Every day | ORAL | Status: DC
  Administered 2016-02-17 – 2016-02-21 (×5): 1000 ug via ORAL
  Filled 2016-02-17 (×5): qty 1

## 2016-02-17 MED ORDER — ONDANSETRON HCL 4 MG/2ML IJ SOLN
4.0000 mg | Freq: Once | INTRAMUSCULAR | Status: AC
  Administered 2016-02-17: 4 mg via INTRAVENOUS
  Filled 2016-02-17: qty 2

## 2016-02-17 MED ORDER — LEVOTHYROXINE SODIUM 25 MCG PO TABS
125.0000 ug | ORAL_TABLET | Freq: Every day | ORAL | Status: DC
  Administered 2016-02-18: 125 ug via ORAL
  Filled 2016-02-17: qty 1

## 2016-02-17 MED ORDER — DEXTROSE 5 % IV SOLN
2.0000 g | Freq: Once | INTRAVENOUS | Status: DC
  Administered 2016-02-17: 2 g via INTRAVENOUS

## 2016-02-17 MED ORDER — AMIODARONE HCL IN DEXTROSE 360-4.14 MG/200ML-% IV SOLN: 30.0000 mg/h | INTRAVENOUS | Status: DC

## 2016-02-17 MED ORDER — CEFEPIME HCL 2 G IJ SOLR
INTRAMUSCULAR | Status: AC
  Administered 2016-02-17: 2000 mg via INTRAVENOUS
  Filled 2016-02-17: qty 2

## 2016-02-17 MED ORDER — AMIODARONE HCL IN DEXTROSE 360-4.14 MG/200ML-% IV SOLN
60.0000 mg/h | INTRAVENOUS | Status: DC
  Administered 2016-02-17 (×2): 60 mg/h via INTRAVENOUS
  Filled 2016-02-17 (×2): qty 200

## 2016-02-17 MED ORDER — ATORVASTATIN CALCIUM 20 MG PO TABS
20.0000 mg | ORAL_TABLET | Freq: Every day | ORAL | Status: DC
  Administered 2016-02-18 – 2016-02-20 (×3): 20 mg via ORAL
  Filled 2016-02-17 (×4): qty 1

## 2016-02-17 NOTE — Consult Note (Signed)
CARDIOLOGY CONSULT NOTE  Patient ID: [name], MRN: [id number], DOB/AGE: [birth year] 76 y.o. Admit date: 02/17/2016 Date of Consult: 02/17/2016  Primary Physician: Laurey Morale, MD Primary Cardiologist: Dr. Johnsie Cancel Referring Physician: Dr Candiss Norse  Chief Complaint: Shortness of breath  Reason for Consultation: Atrial fibrillation, hypotension  HPI: 82 year old woman with chronic atrial fibrillation who presents with 4 days of cough, fever, shortness of breath, and chest pressure. Her shortness of breath is worsened over the last 48 hours. A CT scan in the emergency department was suggestive of atypical infection versus ARDS with bilateral interstitial changes noted.  The patient's niece is at the bedside. At present, she complains of shortness of breath and states that she feels weak. She's had some chest pressure associated with this. No orthopnea, PND, or leg swelling. No syncope. No heart palpitations.  Medical History:  Past Medical History  Diagnosis Date  . Hypertension   . Chest pain, unspecified   . Atrial fibrillation ([place])   . Mitral valve disorder   . Patent foramen ovale   . Peripheral vascular disease ([place])   . Venous insufficiency   . Hyperlipidemia   . Impaired glucose tolerance   . Hypothyroid   . Diverticulosis of colon   . History of colonic polyps   . History of colon cancer   . Splenic infarction   . History of uterine cancer   . UTI (lower urinary tract infection)   . Osteoarthritis   . Chronic low back pain   . Osteoporosis   . Peripheral neuropathy ([place])   . Anxiety   . Vitamin B12 deficiency   . History of colonoscopy   . Cataract     REMOVED BILATERAL      Surgical History:  Past Surgical History  Procedure Laterality Date  . Hysterectomy for uterine cancer    . Anterior and posterior vaginal repair  03/2006    Dr.Neal  . Basal cell skin cancer moh's surgery    . Right hemicolectomy for colon cancer  1994  . Lumbar laminectomy for  spinal stenosis  2006    L3-4  . Left carpal tunnel surgery  02/2007    Dr. Daylene Katayama  . Retinal detachment repair w/ scleral buckle le Right 03-01-14    per Dr. Zadie Rhine   . Colonoscopy  05-25-10    per Dr. Henrene Pastor, benign polyps, repeat in 5 yrs   . Eye surgery    . Cataract extraction, bilateral       Home Meds: Prior to Admission medications   Medication Sig Start Date End Date Taking? Authorizing Provider  ALPRAZolam (XANAX) 0.25 MG tablet Take 1 tablet (0.25 mg total) by mouth at bedtime as needed for sleep. 04/19/15   Laurey Morale, MD  atorvastatin (LIPITOR) 20 MG tablet Take 20 mg by mouth daily. 09/07/15   Historical Provider, MD  benzonatate (TESSALON) 100 MG capsule Take 1 capsule (100 mg total) by mouth every 8 (eight) hours. 02/16/16   April Palumbo, MD  Calcium Carbonate-Vitamin D (CALTRATE 600+D) 600-400 MG-UNIT per tablet Take 1 tablet by mouth daily.      Historical Provider, MD  carvedilol (COREG) 6.25 MG tablet TAKE 1 TABLET TWICE A DAY 11/25/15   Josue Hector, MD  estradiol (CLIMARA - DOSED IN MG/24 HR) 0.1 mg/24hr Place 1 patch onto the skin once a week. [place]    Historical Provider, MD  furosemide (LASIX) 40 MG tablet Take 1 tablet (40 mg total) by mouth daily. 12/20/15  Thurnell Lose, MD  gabapentin (NEURONTIN) 300 MG capsule Take 1 capsule (300 mg total) by mouth 2 (two) times daily. 12/21/15   Laurey Morale, MD  HYDROcodone-acetaminophen (NORCO/VICODIN) 5-325 MG tablet Take 1 tablet by mouth every 6 (six) hours as needed for moderate pain.  12/14/15   Historical Provider, MD  ketorolac (ACULAR) 0.5 % ophthalmic solution Place 1 drop into the right eye 2 (two) times daily. 03/17/15   Historical Provider, MD  levothyroxine (SYNTHROID, LEVOTHROID) 125 MCG tablet Take 1 tablet (125 mcg total) by mouth daily. 02/01/15   Josue Hector, MD  loperamide (IMODIUM A-D) 2 MG tablet Take 2 mg by mouth 4 (four) times daily as needed for diarrhea or loose stools.    Historical Provider, MD   meclizine (ANTIVERT) 25 MG tablet TAKE 1/2 OR 1 TABLET BY MOUTH EVERY 6 HOURS AS NEEDED FOR DIZZINESS 04/19/15   Laurey Morale, MD  Multiple Vitamins-Minerals (CENTRUM SILVER PO) Take 1 tablet by mouth daily.      Historical Provider, MD  potassium chloride SA (K-DUR,KLOR-CON) 20 MEQ tablet Take 1 tablet (20 mEq total) by mouth 2 (two) times daily. 09/12/15   Josue Hector, MD  quinapril (ACCUPRIL) 20 MG tablet Take 1 tablet (20 mg total) by mouth daily. 12/20/15   Thurnell Lose, MD  spironolactone (ALDACTONE) 25 MG tablet Take 1 tablet (25 mg total) by mouth daily. 12/20/15   Thurnell Lose, MD  vitamin B-12 (CYANOCOBALAMIN) 1000 MCG tablet Take 1,000 mcg by mouth daily.      Historical Provider, MD  warfarin (COUMADIN) 5 MG tablet TAKE AS DIRECTED BY ANTICOAGULATION CLINIC Patient taking differently: No sig reported 10/27/15   Josue Hector, MD    Inpatient Medications:  . [START ON 02/18/2016] atorvastatin  20 mg Oral q1800  . azithromycin  500 mg Intravenous Q24H  . benzonatate  100 mg Oral Q8H  . cefTRIAXone (ROCEPHIN)  IV  1 g Intravenous Q24H  . furosemide  40 mg Oral Daily  . ketorolac  1 drop Right Eye BID  . levothyroxine  125 mcg Oral Daily  . oseltamivir  30 mg Oral BID  . potassium chloride SA  20 mEq Oral BID  . sodium chloride  1,000 mL Intravenous Once  . sodium chloride flush  3 mL Intravenous Q12H  . vitamin B-12  1,000 mcg Oral Daily  . white petrolatum       . sodium chloride    . heparin      Allergies:  Allergies  Allergen Reactions  . Codeine     REACTION: nausea    Social History   Social History  . Marital Status: Married    Spouse Name: N/A  . Number of Children: 0  . Years of Education: N/A   Occupational History  . retired     part time at [employer]  . Smoking status: Never Smoker   . Smokeless tobacco: Never Used  . Alcohol Use: 0.0 oz/week    0 Standard drinks or equivalent per week     Comment: occ  .  Drug Use: No  . Sexual Activity: Not on file   Other Topics Concern  . Not on file   Social History Narrative     Family History  Problem Relation Age of Onset  . Cancer Mother     deceased age 1  . Heart failure Father     deceased age 107  .  Cancer Brother     deceased age 63  . Stroke Sister     and heart problems; deceased age 64  . Heart disease Sister      Review of Systems: General: Positive for chills, fever, night sweats ENT: negative for rhinorrhea or epistaxis Cardiovascular: See history of present illness Dermatological: negative for rash Respiratory: Positive for cough  GI: negative for nausea, vomiting, diarrhea, bright red blood per rectum, melena, or hematemesis GU: no hematuria, urgency, or frequency Neurologic: negative for visual changes, syncope, headache, positive for dizziness Heme: no easy bruising or bleeding Endo: negative for excessive thirst, thyroid disorder, or flushing Musculoskeletal: Positive for joint pain, negative for myalgias  All other systems reviewed and are otherwise negative except as noted above.  Physical Exam: Blood pressure 101/81, pulse 122, temperature 98.5 F (36.9 C), temperature source Oral, resp. rate 26, height 5\' 11"  (1.803 m), weight 188 lb 9.6 oz (85.548 kg), SpO2 93 %. Pt is alert and oriented, pleasant elderly woman in mild respiratory distress HEENT: normal Neck: JVP normal. Carotid upstrokes normal without bruits. No thyromegaly. Lungs: equal expansion, coarse breath sounds bilaterally CV: Apex is discrete and nondisplaced, RRR 2/6 harsh systolic mid peaking murmur at the right upper sternal border, I do not appreciate a diastolic murmur. Abd: soft, NT, +BS, no bruit, no hepatosplenomegaly Back: no CVA tenderness Ext: no C/C/E        DP/PT pulses intact and = Skin: warm and dry without rash Neuro: CNII-XII intact             Strength intact = bilaterally    Labs:  Recent Labs  02/17/16 0130    TROPONINI 0.03   Lab Results  Component Value Date   WBC 9.1 02/17/2016   HGB 11.8* 02/17/2016   HCT 36.8 02/17/2016   MCV 94.8 02/17/2016   PLT 236 02/17/2016    Recent Labs Lab 02/17/16 0130  NA 134*  K 4.5  CL 102  CO2 22  BUN 21*  CREATININE 0.99  CALCIUM 8.7*  PROT 6.9  BILITOT 1.5*  ALKPHOS 70  ALT 17  AST 23  GLUCOSE 156*   Lab Results  Component Value Date   CHOL 139 03/11/2015   HDL 41.10 03/11/2015   LDLCALC 69 03/11/2015   TRIG 143.0 03/11/2015   No results found for: DDIMER  Radiology/Studies:  Dg Chest 2 View  02/15/2016  CLINICAL DATA:  Cough and congestion. Chills in shortness of breath. EXAM: CHEST  2 VIEW COMPARISON:  12/15/2015 FINDINGS: Enlargement of the cardiac silhouette is unchanged. Thoracic aortic atherosclerosis is noted. The lungs remain hyperinflated with similar appearance of chronic interstitial changes suggestive of COPD. No acute airspace consolidation, edema, pleural effusion, or pneumothorax is identified. The no acute osseous abnormality is identified. IMPRESSION: Unchanged appearance of the chest without evidence of acute cardiopulmonary process. Electronically Signed   By: Logan Bores M.D.   On: 02/15/2016 22:23   Ct Chest Wo Contrast  02/17/2016  CLINICAL DATA:  Increasing cough, fever, chills, shortness of breath and generalize weakness for 4 days. EXAM: CT CHEST WITHOUT CONTRAST TECHNIQUE: Multidetector CT imaging of the chest was performed following the standard protocol without IV contrast. COMPARISON:  None. FINDINGS: Diffuse cardiac enlargement. Calcification in the mitral valve annulus, aortic valve, and Coronary arteries. Calcification in the aorta. No aortic dilatation. Prominent mediastinal lymph nodes with pretracheal node measuring 1.5 cm diameter. Probable prominent right hilar nodes as well. These nodes may be reactive. Esophagus is decompressed.  There is diffuse patchy airspace disease in the perihilar regions with  interstitial thickening suggesting edema. Changes likely to represent pulmonary edema although bilateral pneumonia or ARDS could also have this appearance. Airways appear patent. No pleural effusions. No pneumothorax. Included portions of the upper abdominal organs suggest possible hepatic cirrhosis with enlarged lateral segment of the left lobe of liver. Degenerative changes in the spine. Anterior compression of T12. This is unchanged since previous CT abdomen and pelvis 12/15/2015. IMPRESSION: Cardiac enlargement with perihilar airspace and interstitial disease. This is likely due to edema although bilateral pneumonia or ARDS could also have this appearance. Electronically Signed   By: Lucienne Capers M.D.   On: 02/17/2016 03:15    EKG: Atrial fibrillation with RVR, diffuse ST segment depression consider inferolateral ischemia  Cardiac Studies: 2-D echocardiogram 10/05/2014: Study Conclusions  - Left ventricle: The cavity size was normal. There was mild focal basal hypertrophy of the septum. Systolic function was normal. The estimated ejection fraction was in the range of 55% to 60%. Wall motion was normal; there were no regional wall motion abnormalities. The study was not technically sufficient to allow evaluation of LV diastolic dysfunction due to atrial fibrillation. - Aortic valve: Severe thickening and calcification, consistent with sclerosis. Valve mobility was restricted. There was mild stenosis. Valve area (VTI): 1.18 cm^2. Valve area (Vmean): 1.08 cm^2. - Mitral valve: Calcified annulus. Severe diffuse thickening and calcification of the anterior leaflet, with moderate involvement of chords. Leaflet separation was severely reduced. Mobility of the anterior leaflet was severely restricted. Diastolic leaflet doming was present. The findings are consistent with moderate to severe stenosis. There was moderate regurgitation. Valve area by pressure  half-time: 1.09 cm^2. Valve area by continuity equation (using LVOT flow): 0.84 cm^2. - Left atrium: The atrium was massively dilated. - Pulmonary arteries: PA peak pressure: 31 mm Hg (S).  Impressions:  - The right ventricular systolic pressure was increased consistent with mild pulmonary hypertension.  ASSESSMENT AND PLAN:  1. Acute hypoxic respiratory failure: Primary issue is likely atypical pneumonia versus influenza. The patient is being treated with broad-spectrum antibiotics and Tamiflu. Cultures are pending.  2. Suspected acute diastolic heart failure: The patient has valvular heart disease with mild aortic stenosis and severe mitral stenosis. Her echocardiogram from 2015 is reviewed and it showed severe restriction of her mitral leaflets with a mitral valve area of 1.1 cm. She has chronic atrial fibrillation, now with rapid ventricular response. With rapid heart rates, mitral stenosis is a more significant lesion because of reduced filling time and I suspect she has acute heart failure related to that. Because her blood pressure is low, she is not currently a candidate for beta blockers. Recommend that we treat her acutely with IV amiodarone for rate control. Hopefully her blood pressure will tolerate this. She may require diuresis, but would try rate control first.  3. Chronic atrial fibrillation, now with RVR: Start IV amiodarone as above. Coumadin is on hold and I agree with IV heparin. With mitral stenosis and atrial fibrillation, as well as previous embolic event, she should remain anticoagulated while off Coumadin.  Disposition: Will update her echocardiogram, start IV amiodarone, consider diuresis if her pressure remained stable. Would repeat a chest x-ray in the morning. Will follow with you.  SignedSherren Mocha MD, [facility] 02/17/2016, 6:54 PM

## 2016-02-17 NOTE — ED Notes (Signed)
Patient transported to CT 

## 2016-02-17 NOTE — Plan of Care (Signed)
82 yo F with bilateral PNA vs ARDS, hypotension, sepsis.  Could wind up intubated as well.  Put in for SDU if not intubated.  If intubated will need to go to ICU.  Regardless, patient very sick, and we have no beds SDU.  They are holding at [facility] for the moment.

## 2016-02-17 NOTE — ED Provider Notes (Signed)
Care assumed from Dr. Claudine Mouton at shift change. Patient is awaiting a bed on stepdown at [facility]. She appears to have some sort of septic/infectious condition. Her vitals remained stable. She is currently normotensive but somewhat tachycardic and remains in atrial fibrillation. She tells me she still feels somewhat rough but is no worse.  Veryl Speak, MD 02/17/16 [phone]

## 2016-02-17 NOTE — ED Notes (Signed)
Patient is resting comfortably. Family at bedside. Pt on automatic VS, o2 via [place] and continuous pulse ox and cardiac monitoring.

## 2016-02-17 NOTE — ED Notes (Signed)
URI symptoms since Monday, coughing and fever that started Wednesday, dc'd with URI instructions, worsening SOB today, worsening cough and continued fever.  Pt wheezing in triage, last dose of 1000mg  tylenol at 2330.

## 2016-02-17 NOTE — ED Notes (Signed)
[name], [phone]

## 2016-02-17 NOTE — ED Notes (Signed)
Pt resting with lights off, weaned nasal cannula to 2L and pt is maintaining O2 saturations.  She is able to speak in full sentences and stand with minimal assistance to turn and sit on bedside commode.  She states she feels better than when she first arrived, but continues to have general malaise.  Cardiac monitor on and alarms are audible, call light within reach.

## 2016-02-17 NOTE — Progress Notes (Addendum)
ANTICOAGULATION CONSULT NOTE - Initial Consult  Pharmacy Consult for Heparin Indication: atrial fibrillation  Allergies  Allergen Reactions  . Codeine     REACTION: nausea    Patient Measurements: Height: 5\' 11"  (180.3 cm) Weight: 188 lb 9.6 oz (85.548 kg) IBW/kg (Calculated) : 70.8 Heparin Dosing Weight: 85kg  Vital Signs: Temp: 98.5 F (36.9 C) (03/24 1724) Temp Source: Oral (03/24 1724) BP: 101/81 mmHg (03/24 1724) Pulse Rate: 122 (03/24 1657)  Labs:  Recent Labs  02/15/16 1119 02/17/16 0130  HGB  --  11.8*  HCT  --  36.8  PLT  --  236  LABPROT  --  23.5*  INR 1.8 2.11*  CREATININE  --  0.99  TROPONINI  --  0.03    Estimated Creatinine Clearance: 51.2 mL/min (by C-G formula based on Cr of 0.99).   Medical History: Past Medical History  Diagnosis Date  . Hypertension   . Chest pain, unspecified   . Atrial fibrillation ([place])   . Mitral valve disorder   . Patent foramen ovale   . Peripheral vascular disease ([place])   . Venous insufficiency   . Hyperlipidemia   . Impaired glucose tolerance   . Hypothyroid   . Diverticulosis of colon   . History of colonic polyps   . History of colon cancer   . Splenic infarction   . History of uterine cancer   . UTI (lower urinary tract infection)   . Osteoarthritis   . Chronic low back pain   . Osteoporosis   . Peripheral neuropathy ([place])   . Anxiety   . Vitamin B12 deficiency   . History of colonoscopy   . Cataract     REMOVED BILATERAL    Assessment: 82 y.o. female, History of chronic atrial fibrillation on Coumadin, admitted with SOB, plan to hold coumadin for now, pending cardiology consult, pharmacy is consulted to start IV heparin. INR 2.11. Will start heparin tonight without bolus since her INR is close to 2.    Goal of Therapy:  Heparin level 0.3-0.7 units/ml Monitor platelets by anticoagulation protocol: Yes   Plan:  Heparin 1000 units/hr with no bolus F/u 8 hr heparin level and PT/INR in  AM Hold heparin if INR trending up Daily heparin level and CBC  Maryanna Shape, PharmD, BCPS  Clinical Pharmacist  Pager: [phone]   02/17/2016,6:27 PM   Addendum: Pt's INR increased to 2.47 this evening, though patient said her most recent dose of coumadin was yesterday at around 6 PM. Patient received heparin infusion for about 1.5 hrs.  Will hold heparin for now and recheck INR in Am. Restart heparin when INR < 2.   Maryanna Shape, PharmD, BCPS  Clinical Pharmacist  Pager: [phone]

## 2016-02-17 NOTE — ED Provider Notes (Addendum)
CSN: [id number]     Arrival date & time 02/17/16  0105 History   First MD Initiated Contact with Patient 02/17/16 0128     Chief Complaint  Patient presents with  . Shortness of Breath     (Consider location/radiation/quality/duration/timing/severity/associated sxs/prior Treatment) HPI  [name] is a 82 y.o. female with past medical history of atrial fibrillation, on Coumadin, presenting today with URI symptoms. Patient states this has been going on for the past 3 days. She describes a productive cough, fever, and shortness of breath. She was seen here yesterday and had a negative chest x-ray. She was discharged with Tylenol Tessalon Perles which has not been helping. She states she has had worsening shortness of breath now and pain in her chest.  She denies any history of pulmonary embolism. She has been compliant with her Coumadin medicine. There are no further complaints.  10 Systems reviewed and are negative for acute change except as noted in the HPI.     Past Medical History  Diagnosis Date  . Hypertension   . Chest pain, unspecified   . Atrial fibrillation ([place])   . Mitral valve disorder   . Patent foramen ovale   . Peripheral vascular disease ([place])   . Venous insufficiency   . Hyperlipidemia   . Impaired glucose tolerance   . Hypothyroid   . Diverticulosis of colon   . History of colonic polyps   . History of colon cancer   . Splenic infarction   . History of uterine cancer   . UTI (lower urinary tract infection)   . Osteoarthritis   . Chronic low back pain   . Osteoporosis   . Peripheral neuropathy ([place])   . Anxiety   . Vitamin B12 deficiency   . History of colonoscopy   . Cataract     REMOVED BILATERAL   Past Surgical History  Procedure Laterality Date  . Hysterectomy for uterine cancer    . Anterior and posterior vaginal repair  03/2006    Dr.Neal  . Basal cell skin cancer moh's surgery    . Right hemicolectomy for colon cancer  1994  . Lumbar  laminectomy for spinal stenosis  2006    L3-4  . Left carpal tunnel surgery  02/2007    Dr. Daylene Katayama  . Retinal detachment repair w/ scleral buckle le Right 03-01-14    per Dr. Zadie Rhine   . Colonoscopy  05-25-10    per Dr. Henrene Pastor, benign polyps, repeat in 5 yrs   . Eye surgery    . Cataract extraction, bilateral     Family History  Problem Relation Age of Onset  . Cancer Mother     deceased age 80  . Heart failure Father     deceased age 69  . Cancer Brother     deceased age 20  . Stroke Sister     and heart problems; deceased age 80  . Heart disease Sister    Social History  Substance Use Topics  . Smoking status: Never Smoker   . Smokeless tobacco: Never Used  . Alcohol Use: 0.0 oz/week    0 Standard drinks or equivalent per week     Comment: occ   OB History    No data available     Review of Systems    Allergies  Codeine  Home Medications   Prior to Admission medications   Medication Sig Start Date End Date Taking? Authorizing Provider  ALPRAZolam Duanne Moron) 0.25 MG tablet Take  1 tablet (0.25 mg total) by mouth at bedtime as needed for sleep. 04/19/15   Laurey Morale, MD  atorvastatin (LIPITOR) 20 MG tablet Take 20 mg by mouth daily. 09/07/15   Historical Provider, MD  benzonatate (TESSALON) 100 MG capsule Take 1 capsule (100 mg total) by mouth every 8 (eight) hours. 02/16/16   April Palumbo, MD  Calcium Carbonate-Vitamin D (CALTRATE 600+D) 600-400 MG-UNIT per tablet Take 1 tablet by mouth daily.      Historical Provider, MD  carvedilol (COREG) 6.25 MG tablet TAKE 1 TABLET TWICE A DAY 11/25/15   Josue Hector, MD  estradiol (CLIMARA - DOSED IN MG/24 HR) 0.1 mg/24hr Place 1 patch onto the skin once a week. [place]    Historical Provider, MD  furosemide (LASIX) 40 MG tablet Take 1 tablet (40 mg total) by mouth daily. 12/20/15   Thurnell Lose, MD  gabapentin (NEURONTIN) 300 MG capsule Take 1 capsule (300 mg total) by mouth 2 (two) times daily. 12/21/15   Laurey Morale, MD   HYDROcodone-acetaminophen (NORCO/VICODIN) 5-325 MG tablet Take 1 tablet by mouth every 6 (six) hours as needed for moderate pain.  12/14/15   Historical Provider, MD  ketorolac (ACULAR) 0.5 % ophthalmic solution Place 1 drop into the right eye 2 (two) times daily. 03/17/15   Historical Provider, MD  levothyroxine (SYNTHROID, LEVOTHROID) 125 MCG tablet Take 1 tablet (125 mcg total) by mouth daily. 02/01/15   Josue Hector, MD  loperamide (IMODIUM A-D) 2 MG tablet Take 2 mg by mouth 4 (four) times daily as needed for diarrhea or loose stools.    Historical Provider, MD  meclizine (ANTIVERT) 25 MG tablet TAKE 1/2 OR 1 TABLET BY MOUTH EVERY 6 HOURS AS NEEDED FOR DIZZINESS 04/19/15   Laurey Morale, MD  Multiple Vitamins-Minerals (CENTRUM SILVER PO) Take 1 tablet by mouth daily.      Historical Provider, MD  potassium chloride SA (K-DUR,KLOR-CON) 20 MEQ tablet Take 1 tablet (20 mEq total) by mouth 2 (two) times daily. 09/12/15   Josue Hector, MD  quinapril (ACCUPRIL) 20 MG tablet Take 1 tablet (20 mg total) by mouth daily. 12/20/15   Thurnell Lose, MD  spironolactone (ALDACTONE) 25 MG tablet Take 1 tablet (25 mg total) by mouth daily. 12/20/15   Thurnell Lose, MD  vitamin B-12 (CYANOCOBALAMIN) 1000 MCG tablet Take 1,000 mcg by mouth daily.      Historical Provider, MD  warfarin (COUMADIN) 5 MG tablet TAKE AS DIRECTED BY ANTICOAGULATION CLINIC Patient taking differently: No sig reported 10/27/15   Josue Hector, MD   BP 84/60 mmHg  Pulse 144  Temp(Src) 99.7 F (37.6 C) (Oral)  Resp 22  SpO2 90% Physical Exam  Constitutional: She is oriented to person, place, and time. She appears well-developed and well-nourished. She appears distressed.  HENT:  Head: Normocephalic and atraumatic.  Nose: Nose normal.  Mouth/Throat: Oropharynx is clear and moist. No oropharyngeal exudate.  Eyes: Conjunctivae and EOM are normal. Pupils are equal, round, and reactive to light. No scleral icterus.  Neck: Normal  range of motion. Neck supple. No JVD present. No tracheal deviation present. No thyromegaly present.  Cardiovascular: Normal heart sounds.  Exam reveals no gallop and no friction rub.   No murmur heard. Irregularly irregular rhythm with tachycardia  Pulmonary/Chest: Breath sounds normal. She is in respiratory distress. She has no wheezes. She exhibits no tenderness.  Tachypnea, increased worker breathing, use of accessory muscles. Decreased breath sounds diffusely  Abdominal: Soft. Bowel sounds are normal. She exhibits no distension and no mass. There is no tenderness. There is no rebound and no guarding.  Musculoskeletal: Normal range of motion. She exhibits edema. She exhibits no tenderness.  Left lower extremity in a cast and postop shoe  Lymphadenopathy:    She has no cervical adenopathy.  Neurological: She is alert and oriented to person, place, and time. No cranial nerve deficit. She exhibits normal muscle tone.  Skin: Skin is warm and dry. No rash noted. No erythema. No pallor.  Nursing note and vitals reviewed.   ED Course  Procedures (including critical care time) Labs Review Labs Reviewed  COMPREHENSIVE METABOLIC PANEL - Abnormal; Notable for the following:    Sodium 134 (*)    Glucose, Bld 156 (*)    BUN 21 (*)    Calcium 8.7 (*)    Total Bilirubin 1.5 (*)    GFR calc non Af Amer 51 (*)    GFR calc Af Amer 59 (*)    All other components within normal limits  CBC WITH DIFFERENTIAL/PLATELET - Abnormal; Notable for the following:    Hemoglobin 11.8 (*)    Neutro Abs 8.0 (*)    Lymphs Abs 0.4 (*)    All other components within normal limits  URINALYSIS, ROUTINE W REFLEX MICROSCOPIC (NOT AT [facility]) - Abnormal; Notable for the following:    Ketones, ur 15 (*)    All other components within normal limits  PROTIME-INR - Abnormal; Notable for the following:    Prothrombin Time 23.5 (*)    INR 2.11 (*)    All other components within normal limits  BRAIN NATRIURETIC PEPTIDE -  Abnormal; Notable for the following:    B Natriuretic Peptide 198.3 (*)    All other components within normal limits  CULTURE, BLOOD (ROUTINE X 2)  CULTURE, BLOOD (ROUTINE X 2)  URINE CULTURE  TROPONIN I  I-STAT CG4 LACTIC ACID, ED    Imaging Review Dg Chest 2 View  02/15/2016  CLINICAL DATA:  Cough and congestion. Chills in shortness of breath. EXAM: CHEST  2 VIEW COMPARISON:  12/15/2015 FINDINGS: Enlargement of the cardiac silhouette is unchanged. Thoracic aortic atherosclerosis is noted. The lungs remain hyperinflated with similar appearance of chronic interstitial changes suggestive of COPD. No acute airspace consolidation, edema, pleural effusion, or pneumothorax is identified. The no acute osseous abnormality is identified. IMPRESSION: Unchanged appearance of the chest without evidence of acute cardiopulmonary process. Electronically Signed   By: Logan Bores M.D.   On: 02/15/2016 22:23   Ct Chest Wo Contrast  02/17/2016  CLINICAL DATA:  Increasing cough, fever, chills, shortness of breath and generalize weakness for 4 days. EXAM: CT CHEST WITHOUT CONTRAST TECHNIQUE: Multidetector CT imaging of the chest was performed following the standard protocol without IV contrast. COMPARISON:  None. FINDINGS: Diffuse cardiac enlargement. Calcification in the mitral valve annulus, aortic valve, and Coronary arteries. Calcification in the aorta. No aortic dilatation. Prominent mediastinal lymph nodes with pretracheal node measuring 1.5 cm diameter. Probable prominent right hilar nodes as well. These nodes may be reactive. Esophagus is decompressed. There is diffuse patchy airspace disease in the perihilar regions with interstitial thickening suggesting edema. Changes likely to represent pulmonary edema although bilateral pneumonia or ARDS could also have this appearance. Airways appear patent. No pleural effusions. No pneumothorax. Included portions of the upper abdominal organs suggest possible hepatic  cirrhosis with enlarged lateral segment of the left lobe of liver. Degenerative changes in the spine. Anterior  compression of T12. This is unchanged since previous CT abdomen and pelvis 12/15/2015. IMPRESSION: Cardiac enlargement with perihilar airspace and interstitial disease. This is likely due to edema although bilateral pneumonia or ARDS could also have this appearance. Electronically Signed   By: Lucienne Capers M.D.   On: 02/17/2016 03:15   I have personally reviewed and evaluated these images and lab results as part of my medical decision-making.   EKG Interpretation   Date/Time:  Friday February 17 2016 01:20:28 EDT Ventricular Rate:  138 PR Interval:    QRS Duration: 82 QT Interval:  263 QTC Calculation: 398 R Axis:   33 Text Interpretation:  Atrial fibrillation with rvr Repolarization  abnormality, prob rate related Baseline wander in lead(s) V2 Confirmed by  Glynn Octave [phone]) on 02/17/2016 1:30:31 AM      MDM   Final diagnoses:  None  Patient presents emergency department for worsening shortness of breath, fever, cough. Vital signs are concerning for sepsis as well as her history. Code sepsis was called. Patient was admitted to the hospital in January so she is being treated for HCAP.  Since chest x-ray was negative yesterday will obtain CT scan of the chest today. She is hypoxic and requiring 4 L nasal cannula to obtain oxygen saturation 94%. She will require admission for further care.  3:59 AM CT scan reveals pulmonary edema versus ARDS versus bilateral pneumonia. Given patient's history and septic vital signs, she likely has bilateral pneumonia. After 2 L of IV fluids her blood pressure has gone up from 80 systolic to [id number] systolic. Heart rate has decreased from 140 to 115.  Patient has started coughing a bit more. This is likely due to fluid overload from the resuscitation. We'll give 20 mg IV Lasix as this is her home medicine. Patient is admitted to [facility]  stepdown however there are no beds there.  5:17 AM I spoke with ICU for consultation on this patient as she will be here for a while.  He recs for bipap if she worsens, gentle diuresis with lasix and consider nebs if patient has history of COPD (xopenex due to a fib), and amiodarone if afib with rvr returns.  Patient still awaiting bed at [facility] for step down and will be signed out to Dr. Stark Jock for continued care.    CRITICAL CARE Performed by: Everlene Balls   Total critical care time: 120 minutes - respiratory distress, sepsis  Critical care time was exclusive of separately billable procedures and treating other patients.  Critical care was necessary to treat or prevent imminent or life-threatening deterioration.  Critical care was time spent personally by me on the following activities: development of treatment plan with patient and/or surrogate as well as nursing, discussions with consultants, evaluation of patient's response to treatment, examination of patient, obtaining history from patient or surrogate, ordering and performing treatments and interventions, ordering and review of laboratory studies, ordering and review of radiographic studies, pulse oximetry and re-evaluation of patient's condition.      Everlene Balls, MD 02/17/16 [phone]

## 2016-02-17 NOTE — H&P (Signed)
TRH H&P   Patient Demographics:    [name], is a 82 y.o. female  MRN: [id number]   DOB - [birth year]  Admit Date - 02/17/2016  Outpatient Primary MD for the patient is Laurey Morale, MD  Referring MD/NP/PA: Dr Stark Jock  Outpatient Specialists: Dr Johnsie Cancel    Patient coming from: [facility]  Chief Complaint  Patient presents with  . Shortness of Breath      HPI:    [name]  is a 82 y.o. female,  History of chronic atrial fibrillation on Coumadin, osteoarthritis, essential hypertension, dyslipidemia, diverticulosis, mild aortic stenosis who was in her usual state of health, lives at home by herself, initially presented to [facility] 4 days ago with chief complaints of a dry hacking cough and low-grade fever, after initial workup she was discharged home with cough medication, she continued to get gradually worse, started to develop low-grade fevers, shortness of breath and cough got worse, resented to [facility] again this morning.  In the ER she was diagnosed with acute hypoxic respiratory failure, she had low-grade fever, blood work was unremarkable, lactic acid was stable, chest x-ray and CT scan was suggestive of bilateral interstitial pattern suggestive of atypical infection versus ARDS, patient was accepted to stepdown unit by one of my partners this morning presented several hours later due to bed crunch.  Currently besides dry cough and mild shortness of breath patient has no subjective complaints, she is noted to be mildly hypotensive, she has A. fib with RVR, does not appear toxic. Denies any recent travel, no chest pain, no exposure to sick contacts, no abdominal pain, no diarrhea or  dysuria, no blood in stool or urine, no focal weakness.    Review of systems:    In addition to the HPI above,   No Fever-chills, No Headache, No changes with Vision or hearing, No problems swallowing food or Liquids, No Chest pain,   No Abdominal pain, No Nausea or Vommitting, Bowel movements are regular, No Blood in stool or Urine, No dysuria, No new skin rashes or bruises, No new joints pains-aches,  No new weakness, tingling, numbness in any extremity, No recent weight gain or loss, No polyuria, polydypsia or polyphagia, No significant Mental Stressors.  A full 10 point Review of Systems was done, except as  stated above, all other Review of Systems were negative.   With Past History of the following :    Past Medical History  Diagnosis Date  . Hypertension   . Chest pain, unspecified   . Atrial fibrillation ([place])   . Mitral valve disorder   . Patent foramen ovale   . Peripheral vascular disease ([place])   . Venous insufficiency   . Hyperlipidemia   . Impaired glucose tolerance   . Hypothyroid   . Diverticulosis of colon   . History of colonic polyps   . History of colon cancer   . Splenic infarction   . History of uterine cancer   . UTI (lower urinary tract infection)   . Osteoarthritis   . Chronic low back pain   . Osteoporosis   . Peripheral neuropathy ([place])   . Anxiety   . Vitamin B12 deficiency   . History of colonoscopy   . Cataract     REMOVED BILATERAL      Past Surgical History  Procedure Laterality Date  . Hysterectomy for uterine cancer    . Anterior and posterior vaginal repair  03/2006    Dr.Neal  . Basal cell skin cancer moh's surgery    . Right hemicolectomy for colon cancer  1994  . Lumbar laminectomy for spinal stenosis  2006    L3-4  . Left carpal tunnel surgery  02/2007    Dr. Daylene Katayama  . Retinal detachment repair w/ scleral buckle le Right 03-01-14    per Dr. Zadie Rhine   . Colonoscopy  05-25-10    per Dr. Henrene Pastor, benign polyps, repeat in 5  yrs   . Eye surgery    . Cataract extraction, bilateral        Social History:     Social History  Substance Use Topics  . Smoking status: Never Smoker   . Smokeless tobacco: Never Used  . Alcohol Use: 0.0 oz/week    0 Standard drinks or equivalent per week     Comment: occ     Lives - At home and fairly mobile      Family History :     Family History  Problem Relation Age of Onset  . Cancer Mother     deceased age 81  . Heart failure Father     deceased age 87  . Cancer Brother     deceased age 29  . Stroke Sister     and heart problems; deceased age 37  . Heart disease Sister       Home Medications:   Prior to Admission medications   Medication Sig Start Date End Date Taking? Authorizing Provider  ALPRAZolam (XANAX) 0.25 MG tablet Take 1 tablet (0.25 mg total) by mouth at bedtime as needed for sleep. 04/19/15   Laurey Morale, MD  atorvastatin (LIPITOR) 20 MG tablet Take 20 mg by mouth daily. 09/07/15   Historical Provider, MD  benzonatate (TESSALON) 100 MG capsule Take 1 capsule (100 mg total) by mouth every 8 (eight) hours. 02/16/16   April Palumbo, MD  Calcium Carbonate-Vitamin D (CALTRATE 600+D) 600-400 MG-UNIT per tablet Take 1 tablet by mouth daily.      Historical Provider, MD  carvedilol (COREG) 6.25 MG tablet TAKE 1 TABLET TWICE A DAY 11/25/15   Josue Hector, MD  estradiol (CLIMARA - DOSED IN MG/24 HR) 0.1 mg/24hr Place 1 patch onto the skin once a week. [place]    Historical Provider, MD  furosemide (LASIX) 40 MG tablet  Take 1 tablet (40 mg total) by mouth daily. 12/20/15   Thurnell Lose, MD  gabapentin (NEURONTIN) 300 MG capsule Take 1 capsule (300 mg total) by mouth 2 (two) times daily. 12/21/15   Laurey Morale, MD  HYDROcodone-acetaminophen (NORCO/VICODIN) 5-325 MG tablet Take 1 tablet by mouth every 6 (six) hours as needed for moderate pain.  12/14/15   Historical Provider, MD  ketorolac (ACULAR) 0.5 % ophthalmic solution Place 1 drop into the right  eye 2 (two) times daily. 03/17/15   Historical Provider, MD  levothyroxine (SYNTHROID, LEVOTHROID) 125 MCG tablet Take 1 tablet (125 mcg total) by mouth daily. 02/01/15   Josue Hector, MD  loperamide (IMODIUM A-D) 2 MG tablet Take 2 mg by mouth 4 (four) times daily as needed for diarrhea or loose stools.    Historical Provider, MD  meclizine (ANTIVERT) 25 MG tablet TAKE 1/2 OR 1 TABLET BY MOUTH EVERY 6 HOURS AS NEEDED FOR DIZZINESS 04/19/15   Laurey Morale, MD  Multiple Vitamins-Minerals (CENTRUM SILVER PO) Take 1 tablet by mouth daily.      Historical Provider, MD  potassium chloride SA (K-DUR,KLOR-CON) 20 MEQ tablet Take 1 tablet (20 mEq total) by mouth 2 (two) times daily. 09/12/15   Josue Hector, MD  quinapril (ACCUPRIL) 20 MG tablet Take 1 tablet (20 mg total) by mouth daily. 12/20/15   Thurnell Lose, MD  spironolactone (ALDACTONE) 25 MG tablet Take 1 tablet (25 mg total) by mouth daily. 12/20/15   Thurnell Lose, MD  vitamin B-12 (CYANOCOBALAMIN) 1000 MCG tablet Take 1,000 mcg by mouth daily.      Historical Provider, MD  warfarin (COUMADIN) 5 MG tablet TAKE AS DIRECTED BY ANTICOAGULATION CLINIC Patient taking differently: No sig reported 10/27/15   Josue Hector, MD     Allergies:     Allergies  Allergen Reactions  . Codeine     REACTION: nausea     Physical Exam:   Vitals  Blood pressure 101/81, pulse 122, temperature 98.5 F (36.9 C), temperature source Oral, resp. rate 26, height 5\' 11"  (1.803 m), weight 85.548 kg (188 lb 9.6 oz), SpO2 93 %.   1. General Pleasant elderly white female lying in bed in NAD,    2. Normal affect and insight, Not Suicidal or Homicidal, Awake Alert, Oriented X 3.  3. No F.N deficits, ALL C.Nerves Intact, Strength 5/5 all 4 extremities, Sensation intact all 4 extremities, Plantars down going.  4. Ears and Eyes appear Normal, Conjunctivae clear, PERRLA. Moist Oral Mucosa.  5. Supple Neck, No JVD, No cervical lymphadenopathy appriciated, No  Carotid Bruits.  6. Symmetrical Chest wall movement, Good air movement bilaterally, few bilateral crackles  7. RRR, No Gallops, Rubs , aortic systolic murmur, No Parasternal Heave.  8. Positive Bowel Sounds, Abdomen Soft, No tenderness, No organomegaly appriciated,No rebound -guarding or rigidity.  9.  No Cyanosis, Normal Skin Turgor, No Skin Rash or Bruise.  10. Good muscle tone,  joints appear normal , no effusions, Normal ROM.  11. No Palpable Lymph Nodes in Neck or Axillae      Data Review:    CBC  Recent Labs Lab 02/17/16 0130  WBC 9.1  HGB 11.8*  HCT 36.8  PLT 236  MCV 94.8  MCH 30.4  MCHC 32.1  RDW 13.3  LYMPHSABS 0.4*  MONOABS 0.6  EOSABS 0.0  BASOSABS 0.0   ------------------------------------------------------------------------------------------------------------------  Chemistries   Recent Labs Lab 02/17/16 0130  NA 134*  K 4.5  CL 102  CO2 22  GLUCOSE 156*  BUN 21*  CREATININE 0.99  CALCIUM 8.7*  AST 23  ALT 17  ALKPHOS 70  BILITOT 1.5*   ------------------------------------------------------------------------------------------------------------------ estimated creatinine clearance is 51.2 mL/min (by C-G formula based on Cr of 0.99). ------------------------------------------------------------------------------------------------------------------ No results for input(s): TSH, T4TOTAL, T3FREE, THYROIDAB in the last 72 hours.  Invalid input(s): FREET3  Coagulation profile  Recent Labs Lab 02/15/16 1119 02/17/16 0130  INR 1.8 2.11*   ------------------------------------------------------------------------------------------------------------------- No results for input(s): DDIMER in the last 72 hours. -------------------------------------------------------------------------------------------------------------------  Cardiac Enzymes  Recent Labs Lab 02/17/16 0130  TROPONINI 0.03    ------------------------------------------------------------------------------------------------------------------    Component Value Date/Time   BNP 198.3* 02/17/2016 0130     ---------------------------------------------------------------------------------------------------------------  Urinalysis    Component Value Date/Time   COLORURINE YELLOW 02/17/2016 0213   APPEARANCEUR CLEAR 02/17/2016 0213   LABSPEC 1.021 02/17/2016 0213   PHURINE 6.5 02/17/2016 0213   GLUCOSEU NEGATIVE 02/17/2016 0213   GLUCOSEU NEGATIVE 07/15/2008 1258   HGBUR NEGATIVE 02/17/2016 0213   BILIRUBINUR NEGATIVE 02/17/2016 0213   BILIRUBINUR n 03/11/2015 1104   KETONESUR 15* 02/17/2016 0213   PROTEINUR NEGATIVE 02/17/2016 0213   PROTEINUR n 03/11/2015 1104   UROBILINOGEN 0.2 03/11/2015 1104   UROBILINOGEN 0.2 09/12/2014 1810   NITRITE NEGATIVE 02/17/2016 0213   NITRITE positive 03/11/2015 1104   LEUKOCYTESUR NEGATIVE 02/17/2016 0213    ----------------------------------------------------------------------------------------------------------------   Imaging Results:    Dg Chest 2 View  02/15/2016  CLINICAL DATA:  Cough and congestion. Chills in shortness of breath. EXAM: CHEST  2 VIEW COMPARISON:  12/15/2015 FINDINGS: Enlargement of the cardiac silhouette is unchanged. Thoracic aortic atherosclerosis is noted. The lungs remain hyperinflated with similar appearance of chronic interstitial changes suggestive of COPD. No acute airspace consolidation, edema, pleural effusion, or pneumothorax is identified. The no acute osseous abnormality is identified. IMPRESSION: Unchanged appearance of the chest without evidence of acute cardiopulmonary process. Electronically Signed   By: Logan Bores M.D.   On: 02/15/2016 22:23   Ct Chest Wo Contrast  02/17/2016  CLINICAL DATA:  Increasing cough, fever, chills, shortness of breath and generalize weakness for 4 days. EXAM: CT CHEST WITHOUT CONTRAST TECHNIQUE:  Multidetector CT imaging of the chest was performed following the standard protocol without IV contrast. COMPARISON:  None. FINDINGS: Diffuse cardiac enlargement. Calcification in the mitral valve annulus, aortic valve, and Coronary arteries. Calcification in the aorta. No aortic dilatation. Prominent mediastinal lymph nodes with pretracheal node measuring 1.5 cm diameter. Probable prominent right hilar nodes as well. These nodes may be reactive. Esophagus is decompressed. There is diffuse patchy airspace disease in the perihilar regions with interstitial thickening suggesting edema. Changes likely to represent pulmonary edema although bilateral pneumonia or ARDS could also have this appearance. Airways appear patent. No pleural effusions. No pneumothorax. Included portions of the upper abdominal organs suggest possible hepatic cirrhosis with enlarged lateral segment of the left lobe of liver. Degenerative changes in the spine. Anterior compression of T12. This is unchanged since previous CT abdomen and pelvis 12/15/2015. IMPRESSION: Cardiac enlargement with perihilar airspace and interstitial disease. This is likely due to edema although bilateral pneumonia or ARDS could also have this appearance. Electronically Signed   By: Lucienne Capers M.D.   On: 02/17/2016 03:15    My personal review of EKG: Rhythm Afib, Rate  120s /min, lateral T wave changes   Assessment & Plan:     1. Acute hypoxic respiratory failure. This most likely CAP from atypical bacterial infection  versus influenza. Will be admitted to stepdown unit, oxygen and nebulizer treatments as needed, supportive care, placed on Rocephin/azithromycin along with Tamiflu, blood and sputum culture, influenza panel, continue oxygen. Currently appears stable. Pulmonary physician informed to keep an eye on her via E Link overnight. Lactic acid is normal, nontoxic, do not think she is septic.  2. Chronic atrial fibrillation. [place] vasc 2 score of at least  3. Currently will hold Coreg as she is hypotensive, she is in RVR, cardiology consulted she most likely will require IV amiodarone, hold Coumadin place her on IV heparin for now.  3. Essential hypertension. Patient is somewhat hypotensive due to combination of #1 above along with RVR, IV fluid bolus and maintenance, hold home diuretics, beta blocker and ACE, monitor closely with IV fluids.  4. Dyslipidemia. Continue home dose statin.  5. Hypothyroidism. Continue home dose Synthroid and check TSH.  6. Mild AS - stable, monitor clinically, repeat echo if cardiology wants. Echo from 2015 noted.  7. Chronic diastolic CHF. Last EF 60% in November 2015. Compensated at this time.  8. Osteoarthritis. Supportive care.   DVT Prophylaxis Heparin gtt  AM Labs Ordered, also please review Full Orders  Family Communication: Admission, patients condition and plan of care including tests being ordered have been discussed with the patient and granddaughter who indicate understanding and agree with the plan and Code Status.  Code Status Full  Likely DC to  Home 3-4 days  Condition GUARDED    Consults called: Cards    Admission status: Inpt Stepdown  Time spent in minutes :35   Sadako Cegielski K M.D on 02/17/2016 at 6:25 PM  Between 7am to 7pm - Pager - [phone]. After 7pm go to [email or web address] - password [facility]  [facility] - Office  [phone]

## 2016-02-18 ENCOUNTER — Inpatient Hospital Stay (HOSPITAL_COMMUNITY): Payer: Medicare Other

## 2016-02-18 DIAGNOSIS — J9601 Acute respiratory failure with hypoxia: Secondary | ICD-10-CM

## 2016-02-18 DIAGNOSIS — J111 Influenza due to unidentified influenza virus with other respiratory manifestations: Secondary | ICD-10-CM

## 2016-02-18 DIAGNOSIS — I5031 Acute diastolic (congestive) heart failure: Secondary | ICD-10-CM

## 2016-02-18 DIAGNOSIS — J189 Pneumonia, unspecified organism: Secondary | ICD-10-CM

## 2016-02-18 LAB — BASIC METABOLIC PANEL
Anion gap: 9 (ref 5–15)
BUN: 14 mg/dL (ref 6–20)
CALCIUM: 8.3 mg/dL — AB (ref 8.9–10.3)
CHLORIDE: 105 mmol/L (ref 101–111)
CO2: 23 mmol/L (ref 22–32)
CREATININE: 0.87 mg/dL (ref 0.44–1.00)
GFR, EST NON AFRICAN AMERICAN: 59 mL/min — AB (ref >60)
Glucose, Bld: 144 mg/dL — ABNORMAL HIGH (ref 65–99)
Potassium: 4 mmol/L (ref 3.5–5.1)
SODIUM: 137 mmol/L (ref 135–145)

## 2016-02-18 LAB — PROTIME-INR
INR: 2.4 — AB (ref 0.00–1.49)
PROTHROMBIN TIME: 25.8 s — AB (ref 11.6–15.2)

## 2016-02-18 LAB — CBC
HCT: 32.9 % — ABNORMAL LOW (ref 36.0–46.0)
HEMOGLOBIN: 10.5 g/dL — AB (ref 12.0–15.0)
MCH: 30 pg (ref 26.0–34.0)
MCHC: 31.9 g/dL (ref 30.0–36.0)
MCV: 94 fL (ref 78.0–100.0)
PLATELETS: 200 10*3/uL (ref 150–400)
RBC: 3.5 MIL/uL — ABNORMAL LOW (ref 3.87–5.11)
RDW: 13.9 % (ref 11.5–15.5)
WBC: 7.4 10*3/uL (ref 4.0–10.5)

## 2016-02-18 LAB — URINE CULTURE: CULTURE: NO GROWTH

## 2016-02-18 MED ORDER — DIGOXIN 125 MCG PO TABS
0.1250 mg | ORAL_TABLET | Freq: Every day | ORAL | Status: DC
  Administered 2016-02-19: 0.125 mg via ORAL
  Filled 2016-02-18: qty 1

## 2016-02-18 MED ORDER — WARFARIN - PHARMACIST DOSING INPATIENT: Freq: Every day | Status: DC

## 2016-02-18 MED ORDER — DEXTROMETHORPHAN POLISTIREX ER 30 MG/5ML PO SUER
30.0000 mg | Freq: Two times a day (BID) | ORAL | Status: DC
  Administered 2016-02-18 – 2016-02-21 (×7): 30 mg via ORAL
  Filled 2016-02-18 (×10): qty 5

## 2016-02-18 MED ORDER — DIGOXIN 0.25 MG/ML IJ SOLN
0.2500 mg | Freq: Once | INTRAMUSCULAR | Status: AC
  Administered 2016-02-18: 0.25 mg via INTRAVENOUS
  Filled 2016-02-18: qty 2

## 2016-02-18 MED ORDER — SODIUM CHLORIDE 0.9 % IV BOLUS (SEPSIS)
250.0000 mL | Freq: Once | INTRAVENOUS | Status: DC
  Administered 2016-02-18: 250 mL via INTRAVENOUS
  Administered 2016-02-18: 06:00:00 via INTRAVENOUS

## 2016-02-18 MED ORDER — DIGOXIN 0.25 MG/ML IJ SOLN
0.1250 mg | Freq: Four times a day (QID) | INTRAMUSCULAR | Status: AC
  Administered 2016-02-18 (×2): 0.125 mg via INTRAVENOUS
  Filled 2016-02-18 (×2): qty 2

## 2016-02-18 MED ORDER — LEVOTHYROXINE SODIUM 100 MCG PO TABS: 100.0000 ug | ORAL_TABLET | Freq: Every day | ORAL | Status: DC

## 2016-02-18 MED ORDER — WARFARIN SODIUM 5 MG PO TABS
5.0000 mg | ORAL_TABLET | Freq: Once | ORAL | Status: AC
  Administered 2016-02-18: 5 mg via ORAL
  Filled 2016-02-18: qty 1

## 2016-02-18 NOTE — Progress Notes (Signed)
Pt BP dropped to 86/49 while on amiodarone drip, HR currently stable in 70s; paged [facility] who suggested paging Cardiology. Per Cardiology MD, stop amiodarone drip and give 250 cc bolus. Will continue to monitor closely.

## 2016-02-18 NOTE — Progress Notes (Signed)
NS bolus stopped per Dr. Saunders Revel. Appropriate volume documented in I&O spreadsheet. Will continue to monitor.

## 2016-02-18 NOTE — Progress Notes (Signed)
[place] for digoxin Indication: rate control for Afib  Labs:  Recent Labs  02/17/16 0130 02/17/16 1902 02/18/16 0232  HGB 11.8*  --  10.5*  HCT 36.8  --  32.9*  PLT 236  --  200  LABPROT 23.5* 26.5* 25.8*  INR 2.11* 2.47* 2.40*  CREATININE 0.99  --  0.87  TROPONINI 0.03  --   --     Estimated Creatinine Clearance: 58.2 mL/min (by C-G formula based on Cr of 0.87).   Medical History: Past Medical History  Diagnosis Date  . Hypertension   . Chest pain, unspecified   . Atrial fibrillation ([place])   . Mitral valve disorder   . Patent foramen ovale   . Peripheral vascular disease ([place])   . Venous insufficiency   . Hyperlipidemia   . Impaired glucose tolerance   . Hypothyroid   . Diverticulosis of colon   . History of colonic polyps   . History of colon cancer   . Splenic infarction   . History of uterine cancer   . UTI (lower urinary tract infection)   . Osteoarthritis   . Chronic low back pain   . Osteoporosis   . Peripheral neuropathy ([place])   . Anxiety   . Vitamin B12 deficiency   . History of colonoscopy   . Cataract     REMOVED BILATERAL    Medications:  Prescriptions prior to admission  Medication Sig Dispense Refill Last Dose  . ALPRAZolam (XANAX) 0.25 MG tablet Take 1 tablet (0.25 mg total) by mouth at bedtime as needed for sleep. 30 tablet 5 Past Week at Unknown time  . atorvastatin (LIPITOR) 20 MG tablet Take 20 mg by mouth daily.   02/16/2016 at Unknown time  . Calcium Carbonate-Vitamin D (CALTRATE 600+D) 600-400 MG-UNIT per tablet Take 1 tablet by mouth daily.     02/16/2016 at Unknown time  . carvedilol (COREG) 6.25 MG tablet TAKE 1 TABLET TWICE A DAY 180 tablet 1 02/16/2016 at 1800  . estradiol (CLIMARA - DOSED IN MG/24 HR) 0.1 mg/24hr Place 1 patch onto the skin once a week. [place]   Past Week at Unknown time  . furosemide (LASIX) 40 MG tablet Take 1 tablet (40 mg total) by mouth daily. 90 tablet 1 02/16/2016 at Unknown time  .  gabapentin (NEURONTIN) 300 MG capsule Take 1 capsule (300 mg total) by mouth 2 (two) times daily. 60 capsule 5 02/16/2016 at Unknown time  . ketorolac (ACULAR) 0.5 % ophthalmic solution Place 1 drop into the right eye 2 (two) times daily.   02/17/2016 at Unknown time  . levothyroxine (SYNTHROID, LEVOTHROID) 125 MCG tablet Take 1 tablet (125 mcg total) by mouth daily. 90 tablet 4 02/16/2016 at Unknown time  . loperamide (IMODIUM A-D) 2 MG tablet Take 2 mg by mouth 4 (four) times daily as needed for diarrhea or loose stools.   02/16/2016 at Unknown time  . meclizine (ANTIVERT) 25 MG tablet TAKE 1/2 OR 1 TABLET BY MOUTH EVERY 6 HOURS AS NEEDED FOR DIZZINESS 30 tablet 5 Past Week at Unknown time  . Multiple Vitamins-Minerals (CENTRUM SILVER PO) Take 1 tablet by mouth daily.     02/16/2016 at Unknown time  . potassium chloride SA (K-DUR,KLOR-CON) 20 MEQ tablet Take 1 tablet (20 mEq total) by mouth 2 (two) times daily. 180 tablet 3 02/16/2016 at Unknown time  . quinapril (ACCUPRIL) 20 MG tablet Take 1 tablet (20 mg total) by mouth daily. (Patient taking differently: Take  20 mg by mouth every evening. ) 90 tablet 3 02/16/2016 at Unknown time  . spironolactone (ALDACTONE) 25 MG tablet Take 1 tablet (25 mg total) by mouth daily. 90 tablet 1 02/16/2016 at Unknown time  . vitamin B-12 (CYANOCOBALAMIN) 1000 MCG tablet Take 1,000 mcg by mouth daily.     02/16/2016 at Unknown time  . warfarin (COUMADIN) 5 MG tablet TAKE AS DIRECTED BY ANTICOAGULATION CLINIC (Patient taking differently: TAKES 7.5MG  ON SUN, TAKES 5MG  ALL OTHER DAYS) 120 tablet 0 02/16/2016 at Unknown time  . benzonatate (TESSALON) 100 MG capsule Take 1 capsule (100 mg total) by mouth every 8 (eight) hours. 21 capsule 0 not started   Scheduled:  . atorvastatin  20 mg Oral q1800  . azithromycin  500 mg Intravenous Q24H  . benzonatate  100 mg Oral Q8H  . cefTRIAXone (ROCEPHIN)  IV  1 g Intravenous Q24H  . digoxin  0.25 mg Intravenous Once   Followed by  .  digoxin  0.125 mg Intravenous Q6H  . [START ON 02/19/2016] digoxin  0.125 mg Oral Daily  . furosemide  40 mg Oral Daily  . ketorolac  1 drop Right Eye BID  . levothyroxine  125 mcg Oral Daily  . oseltamivir  30 mg Oral BID  . potassium chloride SA  20 mEq Oral BID  . sodium chloride flush  3 mL Intravenous Q12H  . vitamin B-12  1,000 mcg Oral Daily    Assessment: 82yo female requires rate control for Afin in setting of hypotension.  Goal of Therapy:  digoxin level 0.5-2 ng/ml   Plan:  Will give digoxin 0.25mg  IV x1 followed by 0.125mg  IV Q6H x2 to load pt then start 0.125mg  PO daily.  Will need f/u labs to determine levels if to remain on digoxin.  Wynona Neat, PharmD, BCPS  02/18/2016,6:11 AM

## 2016-02-18 NOTE — Progress Notes (Signed)
On-Call Cardiology Note  Subjective: I was contacted by the patient's nurse regarding low blood pressures in the setting of amiodarone infusion.  The patient is asymptomatic with the exception of cough.  She denies shortness of breath, orthopnea, chest pain, and lightheadedness.  Objective: Temp:  [97.8 F (36.6 C)-102.4 F (39.1 C)] 97.8 F (36.6 C) (03/25 0300) Pulse Rate:  [42-154] 74 (03/25 0515) Resp:  [15-39] 29 (03/25 0515) BP: (70-132)/(46-88) 70/53 mmHg (03/25 0515) SpO2:  [90 %-97 %] 95 % (03/25 0515) Weight:  [85.3 kg (188 lb 0.8 oz)-85.548 kg (188 lb 9.6 oz)] 85.3 kg (188 lb 0.8 oz) (03/25 0300) Gen: Elderly woman lying in bed. Neck: JVP at least 10 cm Resp: Scattered wheezes with good air movement and bibasilar crackles. CV: Irregularly irregular with 2/6 holosystolic systolic and 1/6 diastolic murmurs loudest at apex. Ext: Left calf cast in place.  No right lower extremity edema  Labs: Na 137, K 4.0, Cl 105, CO2 23, BUN 23, creatinine 0.87, INR 2.4, TSH 0.293, Influenza A positive  Telemetry: Currently atrial fibrillation with HR 70-90 bpm  CT chest: Bilateral patchy airspace opacities (predominantly perihilar and interstitial) with cardiac enlargement.  Assessment/plan:82 y/o woman with atrial fibrillation, severe mitral stenosis, moderate mitral regurgitation, and preserved LV systolic function, admitted with influenza infection complicated by atrial fibrillation with rapid ventricular response.  Patient was started on an amiodarone infusion yesterday due to RVR and soft blood pressures.  Her heart rate is currently under better control, but her blood pressure has been soft.  Most recent pressure obtained by myself was 96/72 mmHg, with HR 82 bpm.  Amiodarone is currently being held and patient is receiving 250 mL normal saline bolus.  Physical exam and CT chest are consistent with volume overload.  Given hypotension in the setting of volume overload, permanent atrial  fibrillation, and mitral stenosis, I think it is reasonable to discontinue amiodarone infusion.  As blood pressure has already started to improve, I will hold IV fluid bolus as well.  I will initiate digoxin to optimize heart rate control, as hypotension precludes use of beta-blocker or calcium channel blocker at this time.  Nelva Bush, MD Cardiology Fellow

## 2016-02-18 NOTE — Discharge Instructions (Addendum)
Follow with Primary MD FRY,STEPHEN A, MD in 3 days   Get CBC, CMP, INR, 2 view Chest X ray checked  by Primary MD next visit.    Activity: As tolerated with Full fall precautions use walker/cane & assistance as needed   Disposition Home    Diet:   Heart Healthy .  For Heart failure patients - Check your Weight same time everyday, if you gain over 2 pounds, or you develop in leg swelling, experience more shortness of breath or chest pain, call your Primary MD immediately. Follow Cardiac Low Salt Diet and 1.5 lit/day fluid restriction.   On your next visit with your primary care physician please Get Medicines reviewed and adjusted.   Please request your Prim.MD to go over all Hospital Tests and Procedure/Radiological results at the follow up, please get all Hospital records sent to your Prim MD by signing hospital release before you go home.   If you experience worsening of your admission symptoms, develop shortness of breath, life threatening emergency, suicidal or homicidal thoughts you must seek medical attention immediately by calling 911 or calling your MD immediately  if symptoms less severe.  You Must read complete instructions/literature along with all the possible adverse reactions/side effects for all the Medicines you take and that have been prescribed to you. Take any new Medicines after you have completely understood and accpet all the possible adverse reactions/side effects.   Do not drive, operating heavy machinery, perform activities at heights, swimming or participation in water activities or provide baby sitting services if your were admitted for syncope or siezures until you have seen by Primary MD or a Neurologist and advised to do so again.  Do not drive when taking Pain medications.    Do not take more than prescribed Pain, Sleep and Anxiety Medications  Special Instructions: If you have smoked or chewed Tobacco  in the last 2 yrs please stop smoking, stop any  regular Alcohol  and or any Recreational drug use.  Wear Seat belts while driving.   Please note  You were cared for by a hospitalist during your hospital stay. If you have any questions about your discharge medications or the care you received while you were in the hospital after you are discharged, you can call the unit and asked to speak with the hospitalist on call if the hospitalist that took care of you is not available. Once you are discharged, your primary care physician will handle any further medical issues. Please note that NO REFILLS for any discharge medications will be authorized once you are discharged, as it is imperative that you return to your primary care physician (or establish a relationship with a primary care physician if you do not have one) for your aftercare needs so that they can reassess your need for medications and monitor your lab values.    Information on my medicine - Coumadin   (Warfarin)  This medication education was reviewed with me or my healthcare representative as part of my discharge preparation.  The pharmacist that spoke with me during my hospital stay was:  Darl Pikes, [facility]  Why was Coumadin prescribed for you? Coumadin was prescribed for you because you have a blood clot or a medical condition that can cause an increased risk of forming blood clots. Blood clots can cause serious health problems by blocking the flow of blood to the heart, lung, or brain. Coumadin can prevent harmful blood clots from forming. As a reminder your indication for  Coumadin is:   Select from menu  What test will check on my response to Coumadin? While on Coumadin (warfarin) you will need to have an INR test regularly to ensure that your dose is keeping you in the desired range. The INR (international normalized ratio) number is calculated from the result of the laboratory test called prothrombin time (PT).  If an INR APPOINTMENT HAS NOT ALREADY BEEN MADE FOR YOU please  schedule an appointment to have this lab work done by your health care provider within 7 days. Your INR goal is usually a number between:  2 to 3 or your provider may give you a more narrow range like 2-2.5.  Ask your health care provider during an office visit what your goal INR is.  What  do you need to  know  About  COUMADIN? Take Coumadin (warfarin) exactly as prescribed by your healthcare provider about the same time each day.  DO NOT stop taking without talking to the doctor who prescribed the medication.  Stopping without other blood clot prevention medication to take the place of Coumadin may increase your risk of developing a new clot or stroke.  Get refills before you run out.  What do you do if you miss a dose? If you miss a dose, take it as soon as you remember on the same day then continue your regularly scheduled regimen the next day.  Do not take two doses of Coumadin at the same time.  Important Safety Information A possible side effect of Coumadin (Warfarin) is an increased risk of bleeding. You should call your healthcare provider right away if you experience any of the following: ? Bleeding from an injury or your nose that does not stop. ? Unusual colored urine (red or dark brown) or unusual colored stools (red or black). ? Unusual bruising for unknown reasons. ? A serious fall or if you hit your head (even if there is no bleeding).  Some foods or medicines interact with Coumadin (warfarin) and might alter your response to warfarin. To help avoid this: ? Eat a balanced diet, maintaining a consistent amount of Vitamin K. ? Notify your provider about major diet changes you plan to make. ? Avoid alcohol or limit your intake to 1 drink for women and 2 drinks for men per day. (1 drink is 5 oz. wine, 12 oz. beer, or 1.5 oz. liquor.)  Make sure that ANY health care provider who prescribes medication for you knows that you are taking Coumadin (warfarin).  Also make sure the  healthcare provider who is monitoring your Coumadin knows when you have started a new medication including herbals and non-prescription products.  Coumadin (Warfarin)  Major Drug Interactions  Increased Warfarin Effect Decreased Warfarin Effect  Alcohol (large quantities) Antibiotics (esp. Septra/Bactrim, Flagyl, Cipro) Amiodarone (Cordarone) Aspirin (ASA) Cimetidine (Tagamet) Megestrol (Megace) NSAIDs (ibuprofen, naproxen, etc.) Piroxicam (Feldene) Propafenone (Rythmol SR) Propranolol (Inderal) Isoniazid (INH) Posaconazole (Noxafil) Barbiturates (Phenobarbital) Carbamazepine (Tegretol) Chlordiazepoxide (Librium) Cholestyramine (Questran) Griseofulvin Oral Contraceptives Rifampin Sucralfate (Carafate) Vitamin K   Coumadin (Warfarin) Major Herbal Interactions  Increased Warfarin Effect Decreased Warfarin Effect  Garlic Ginseng Ginkgo biloba Coenzyme Q10 Green tea St. Johns wort    Coumadin (Warfarin) FOOD Interactions  Eat a consistent number of servings per week of foods HIGH in Vitamin K (1 serving =  cup)  Collards (cooked, or boiled & drained) Kale (cooked, or boiled & drained) Mustard greens (cooked, or boiled & drained) Parsley *serving size only =  cup Spinach (cooked,  or boiled & drained) Swiss chard (cooked, or boiled & drained) Turnip greens (cooked, or boiled & drained)  Eat a consistent number of servings per week of foods MEDIUM-HIGH in Vitamin K (1 serving = 1 cup)  Asparagus (cooked, or boiled & drained) Broccoli (cooked, boiled & drained, or raw & chopped) Brussel sprouts (cooked, or boiled & drained) *serving size only =  cup Lettuce, raw (green leaf, endive, romaine) Spinach, raw Turnip greens, raw & chopped   These websites have more information on Coumadin (warfarin):   FailFactory.se; VeganReport.com.au;  -----------------------------------------------------------------------------------------------------------------------------------

## 2016-02-18 NOTE — Consult Note (Signed)
Name: [name] MRN: [id number] DOB: [birth year]    ADMISSION DATE:  02/17/2016 CONSULTATION DATE:  02/18/16  REFERRING MD :  Dr. Candiss Norse  CHIEF COMPLAINT:  Acute Hypoxic Respiratory Failure    HISTORY OF PRESENT ILLNESS:  82 y/o F with PMH of HTN, AF on anticoagulation, MV stenosis, moderate AS, PFO, chronic dCHF, PVD, HLD, hypothyroidism, splenic infarct, osteoarthritis, chronic low back pain s/p lumbar laminectomy for spinal stenosis, colon cancer s/p hemicolectomy, peripheral neuropathy, charcot foot (in cast x2 months), B12 deficiency, & anxiety who presented to [facility] on 3/24 with complaints of a 4 day hx of dry hacking cough and low grade fevers.    At baseline, the patient lives alone, continues to work part time at [employer].    She previously was seen at [facility] on 3/22 and discharged with cough medication.  CXR evaluated on 3/22 visit and was negative for acute process. Symptoms worsened to include feeling achy, fatigue, shortness of breath, palpitations and she returned to the [facility] on 3/24.  Room air saturations on presentation were 90%. CT chest 3/24 concerning for perihilar airspace disease and interstitial disease (concerning for edema).  Repeat evaluation on 3/25 concerning for multifocal opacities - edema vs pna.  Flu PCR was positive for influenza A.  Initial labs - Na 134, K 4.5, Sr Cr 0.99, glucose 156, BNP 198, troponin 0.03, lactic acid 0.93, WBC 9.1, Hgb 11.8, and platelets 236.    The patient was admitted per [facility] and treated with tamiflu.  She currently is requiring 6L per [place] O2.  Pt denies significant sputum production, reports cough is dry & hacking in nature.  One episode of pink sputum production.  Wheezing on 3/24 but resolved.  Reports feeling much better today than on presentation.   PCCM consulted 3/25 for evaluation.    Of note, sister has hx of sarcoidosis and is followed by Dr. Lenna Gilford.  Patient in the past has tested positive for RF and lupus on lab findings  (per patient).    PAST MEDICAL HISTORY :   has a past medical history of Hypertension; Chest pain, unspecified; Atrial fibrillation ([place]); Mitral valve disorder; Patent foramen ovale; Peripheral vascular disease ([place]); Venous insufficiency; Hyperlipidemia; Impaired glucose tolerance; Hypothyroid; Diverticulosis of colon; History of colonic polyps; History of colon cancer; Splenic infarction; History of uterine cancer; UTI (lower urinary tract infection); Osteoarthritis; Chronic low back pain; Osteoporosis; Peripheral neuropathy ([place]); Anxiety; Vitamin B12 deficiency; History of colonoscopy; and Cataract.  has past surgical history that includes hysterectomy for uterine cancer; Anterior and posterior vaginal repair (03/2006); basal cell skin cancer Moh's surgery; right hemicolectomy for colon cancer (1994); lumbar laminectomy for spinal stenosis (2006); left carpal tunnel surgery (02/2007); Retinal detachment repair w/ scleral buckle (Right, 03-01-14); Colonoscopy (05-25-10); Eye surgery; and Cataract extraction, bilateral.   Prior to Admission medications   Medication Sig Start Date End Date Taking? Authorizing Provider  ALPRAZolam (XANAX) 0.25 MG tablet Take 1 tablet (0.25 mg total) by mouth at bedtime as needed for sleep. 04/19/15  Yes Laurey Morale, MD  atorvastatin (LIPITOR) 20 MG tablet Take 20 mg by mouth daily. 09/07/15  Yes Historical Provider, MD  Calcium Carbonate-Vitamin D (CALTRATE 600+D) 600-400 MG-UNIT per tablet Take 1 tablet by mouth daily.     Yes Historical Provider, MD  carvedilol (COREG) 6.25 MG tablet TAKE 1 TABLET TWICE A DAY 11/25/15  Yes Josue Hector, MD  estradiol (CLIMARA - DOSED IN MG/24 HR) 0.1 mg/24hr Place  1 patch onto the skin once a week. mondaya   Yes Historical Provider, MD  furosemide (LASIX) 40 MG tablet Take 1 tablet (40 mg total) by mouth daily. 12/20/15  Yes Thurnell Lose, MD  gabapentin (NEURONTIN) 300 MG capsule Take 1 capsule (300 mg total) by mouth 2 (two) times  daily. 12/21/15  Yes Laurey Morale, MD  ketorolac (ACULAR) 0.5 % ophthalmic solution Place 1 drop into the right eye 2 (two) times daily. 03/17/15  Yes Historical Provider, MD  levothyroxine (SYNTHROID, LEVOTHROID) 125 MCG tablet Take 1 tablet (125 mcg total) by mouth daily. 02/01/15  Yes Josue Hector, MD  loperamide (IMODIUM A-D) 2 MG tablet Take 2 mg by mouth 4 (four) times daily as needed for diarrhea or loose stools.   Yes Historical Provider, MD  meclizine (ANTIVERT) 25 MG tablet TAKE 1/2 OR 1 TABLET BY MOUTH EVERY 6 HOURS AS NEEDED FOR DIZZINESS 04/19/15  Yes Laurey Morale, MD  Multiple Vitamins-Minerals (CENTRUM SILVER PO) Take 1 tablet by mouth daily.     Yes Historical Provider, MD  potassium chloride SA (K-DUR,KLOR-CON) 20 MEQ tablet Take 1 tablet (20 mEq total) by mouth 2 (two) times daily. 09/12/15  Yes Josue Hector, MD  quinapril (ACCUPRIL) 20 MG tablet Take 1 tablet (20 mg total) by mouth daily. Patient taking differently: Take 20 mg by mouth every evening.  12/20/15  Yes Thurnell Lose, MD  spironolactone (ALDACTONE) 25 MG tablet Take 1 tablet (25 mg total) by mouth daily. 12/20/15  Yes Thurnell Lose, MD  vitamin B-12 (CYANOCOBALAMIN) 1000 MCG tablet Take 1,000 mcg by mouth daily.     Yes Historical Provider, MD  warfarin (COUMADIN) 5 MG tablet TAKE AS DIRECTED BY ANTICOAGULATION CLINIC Patient taking differently: TAKES 7.5MG  ON SUN, TAKES 5MG  ALL OTHER DAYS 10/27/15  Yes Josue Hector, MD  benzonatate (TESSALON) 100 MG capsule Take 1 capsule (100 mg total) by mouth every 8 (eight) hours. 02/16/16   Veatrice Kells, MD   Allergies  Allergen Reactions  . Codeine Nausea Only    REACTION: nausea    FAMILY HISTORY:  family history includes Cancer in her brother and mother; Heart disease in her sister; Heart failure in her father; Stroke in her sister.   SOCIAL HISTORY:  reports that she has never smoked. She has never used smokeless tobacco. She reports that she drinks alcohol. She  reports that she does not use illicit drugs.  REVIEW OF SYSTEMS:   Constitutional: Negative for weight loss and diaphoresis. Reports subjective fevers, chills, malaise/fatigue HENT: Negative for hearing loss, ear pain, nosebleeds, congestion, sore throat, neck pain, tinnitus and ear discharge.   Eyes: Negative for blurred vision, double vision, photophobia, pain, discharge and redness.  Respiratory: Negative for hemoptysis, sputum production, and stridor.  Reports cough, shortness of breath, wheezing (resolved), and one episode of pink sputum production.  Cardiovascular: Negative for chest pain, palpitations, orthopnea, claudication, leg swelling and PND.  Gastrointestinal: Negative for heartburn, nausea, vomiting, abdominal pain, diarrhea, constipation, blood in stool and melena.  Genitourinary: Negative for dysuria, urgency, frequency, hematuria and flank pain.  Musculoskeletal: Negative for myalgias, back pain, joint pain and falls.  Skin: Negative for itching and rash.  Neurological: Negative for dizziness, tingling, tremors, sensory change, speech change, focal weakness, seizures, loss of consciousness, weakness and headaches.  Endo/Heme/Allergies: Negative for environmental allergies and polydipsia. Does not bruise/bleed easily.  SUBJECTIVE:   VITAL SIGNS: Temp:  [97.8 F (36.6 C)-102 F (38.9 C)]  99.4 F (37.4 C) (03/25 0801) Pulse Rate:  [54-154] 78 (03/25 0900) Resp:  [15-30] 21 (03/25 0900) BP: (70-132)/(49-81) 99/73 mmHg (03/25 0900) SpO2:  [90 %-100 %] 98 % (03/25 0900) Weight:  [188 lb 0.8 oz (85.3 kg)-188 lb 9.6 oz (85.548 kg)] 188 lb 0.8 oz (85.3 kg) (03/25 0300)  PHYSICAL EXAMINATION: General:  Well developed elderly female in NAD, sitting in chair Neuro:  AAOx4, speech clear, MAE  HEENT:  MM pink/moist, no jvd  Cardiovascular:  s1s2 rrr, no m/r/g  Lungs:  Even/non-labored, lungs bilaterally with crackles posterior Abdomen:  Soft, bsx4 active  Musculoskeletal:  No  acute deformities, LLE in cast / boot Skin:  Warm/dry, trace RLE edema, unable to assess LLE due to cast, changes noted c/w chronic venous stasis   Recent Labs Lab 02/17/16 0130 02/18/16 0232  NA 134* 137  K 4.5 4.0  CL 102 105  CO2 22 23  BUN 21* 14  CREATININE 0.99 0.87  GLUCOSE 156* 144*    Recent Labs Lab 02/17/16 0130 02/18/16 0232  HGB 11.8* 10.5*  HCT 36.8 32.9*  WBC 9.1 7.4  PLT 236 200   Dg Chest 2 View  02/18/2016  CLINICAL DATA:  82 year old female with a history of CHF EXAM: CHEST - 2 VIEW COMPARISON:  Multiple prior, most recent chest x-ray 02/15/2016, CT 02/17/2016 FINDINGS: Cardiomediastinal silhouette unchanged, with cardiomegaly. Compared to the recent chest x-ray, there has been progression of bilateral, right greater than left multifocal airspace disease, in a distribution similar to the CT. No pneumothorax. Stigmata of emphysema, with increased retrosternal airspace, flattened hemidiaphragms, increased AP diameter, and hyperinflation on the AP view. No large pleural effusion. Atherosclerosis of the aortic arch. Osteopenia. No displaced fracture identified. IMPRESSION: Multifocal airspace/interstitial opacities, right greater than left, concerning for pneumonia given the appearance. If the concern is for edema, the asymmetric pattern is atypical, though could represent acute mitral valve regurgitation, and correlation with echocardiography may be useful in this scenario. Atherosclerosis. Signed, Dulcy Fanny. Earleen Newport, DO [facility] Radiology Electronically Signed   By: Corrie Mckusick D.O.   On: 02/18/2016 08:38   Ct Chest Wo Contrast  02/17/2016  CLINICAL DATA:  Increasing cough, fever, chills, shortness of breath and generalize weakness for 4 days. EXAM: CT CHEST WITHOUT CONTRAST TECHNIQUE: Multidetector CT imaging of the chest was performed following the standard protocol without IV contrast. COMPARISON:  None. FINDINGS:  Diffuse cardiac enlargement. Calcification in the mitral valve annulus, aortic valve, and Coronary arteries. Calcification in the aorta. No aortic dilatation. Prominent mediastinal lymph nodes with pretracheal node measuring 1.5 cm diameter. Probable prominent right hilar nodes as well. These nodes may be reactive. Esophagus is decompressed. There is diffuse patchy airspace disease in the perihilar regions with interstitial thickening suggesting edema. Changes likely to represent pulmonary edema although bilateral pneumonia or ARDS could also have this appearance. Airways appear patent. No pleural effusions. No pneumothorax. Included portions of the upper abdominal organs suggest possible hepatic cirrhosis with enlarged lateral segment of the left lobe of liver. Degenerative changes in the spine. Anterior compression of T12. This is unchanged since previous CT abdomen and pelvis 12/15/2015. IMPRESSION: Cardiac enlargement with perihilar airspace and interstitial disease. This is likely due to edema although bilateral pneumonia or ARDS could also have this appearance. Electronically Signed   By: Lucienne Capers M.D.   On: 02/17/2016 03:15   SIGNIFICANT EVENTS  3/22  [facility] in [facility], Rx'd with cough  syrup, d/c'd 3/24  Returned to ER, worsening symptoms, admitted for hypoxic resp fx 3/25  PCCM consulted for pulmonary evaluation   STUDIES:  3/24  CT Chest w/o >> diffuse patchy airspace disease in perihilar regions with interstitial thickening, cardiomegaly  ABX:  Tamiflu 3/25 >>   CULTURES:  3/24  Flu >> positive for influenza A    ASSESSMENT / PLAN:  Influenza A Positive   Acute Hypoxic Respiratory Failure - secondary to influenza   Bilateral Patchy Airspace Disease - suspect component of inflammatory response with flu +/- edema with hx of MV /AV stenosis and AF on presentation.  Edema noted on ER exam 3/24 as well as HR in 140's / AF and was treated with IV fluids / Amio.   Family hx  of sarcoidosis (sister).  Doubt superimposed bacterial infection at this point.     Plan: Continue Tamiflu x 5 days, pharmacy dosing  Droplet precautions Monitor serial CXR's for clearance of airspace disease  Wean O2 to support saturations > 92%.  O2 weaned to 3L while evaluating patient.   Continue lasix 40 mg PO QD, goal negative balance Consider outpatient repeat auto-immune evaluation   Delsym for cough. Pt intolerant to codeine.   Noe Gens, NP-C  Pulmonary & Critical Care Pgr: [phone] or if no answer [phone] 02/18/2016, 11:35 AM

## 2016-02-18 NOTE — Progress Notes (Addendum)
ANTICOAGULATION CONSULT NOTE - Follow-up Consult  Pharmacy Consult for Warfarin Indication: atrial fibrillation  Allergies  Allergen Reactions  . Codeine Nausea Only    REACTION: nausea    Patient Measurements: Height: 5\' 11"  (180.3 cm) Weight: 188 lb 0.8 oz (85.3 kg) IBW/kg (Calculated) : 70.8 Heparin Dosing Weight: 85kg  Vital Signs: Temp: 99.4 F (37.4 C) (03/25 0801) Temp Source: Oral (03/25 0801) BP: 95/69 mmHg (03/25 0801) Pulse Rate: 85 (03/25 0801)  Labs:  Recent Labs  02/17/16 0130 02/17/16 1902 02/18/16 0232  HGB 11.8*  --  10.5*  HCT 36.8  --  32.9*  PLT 236  --  200  LABPROT 23.5* 26.5* 25.8*  INR 2.11* 2.47* 2.40*  CREATININE 0.99  --  0.87  TROPONINI 0.03  --   --     Estimated Creatinine Clearance: 58.2 mL/min (by C-G formula based on Cr of 0.87).   Medical History: Past Medical History  Diagnosis Date  . Hypertension   . Chest pain, unspecified   . Atrial fibrillation ([place])   . Mitral valve disorder   . Patent foramen ovale   . Peripheral vascular disease ([place])   . Venous insufficiency   . Hyperlipidemia   . Impaired glucose tolerance   . Hypothyroid   . Diverticulosis of colon   . History of colonic polyps   . History of colon cancer   . Splenic infarction   . History of uterine cancer   . UTI (lower urinary tract infection)   . Osteoarthritis   . Chronic low back pain   . Osteoporosis   . Peripheral neuropathy ([place])   . Anxiety   . Vitamin B12 deficiency   . History of colonoscopy   . Cataract     REMOVED BILATERAL    Assessment: 82 y.o. female, History of chronic atrial fibrillation on Coumadin, admitted with SOB, pharmacy is consulted to start IV heparin.   INR 2.40, Hgb 10.5, plt wnl  Goal of Therapy:  Heparin level 0.3-0.7 units/ml Monitor platelets by anticoagulation protocol: Yes   Plan:  Will start heparin drip when INR < 2.  Daily INR and CBC  Darl Pikes, PharmD Clinical Pharmacist-  Resident Pager: [phone]  02/18/2016,8:17 AM    Addendum Pharmacy consulted to restart Warfarin, no longer consulted to dose heparin INR 2.40, Hgb 10.5, plt wnl  PTA warfarin dosing: 7.5mg  on Sunday and 5mg  all other days  Plan:  Warfarin 5mg  tonight Daily INR/CBC  Darl Pikes, PharmD Clinical Pharmacist- Resident Pager: [phone]

## 2016-02-18 NOTE — Progress Notes (Signed)
TRH Progress Note                                            Patient Demographics:    [name], is a 82 y.o. female, DOB - 11-02-1929, [id number]  Admit date - 02/17/2016   Admitting Physician Etta Quill, DO  Outpatient Primary MD for the patient is Laurey Morale, MD  LOS - 1  Outpatient Specialists: Dr Johnsie Cancel  Chief Complaint  Patient presents with  . Shortness of Breath        Subjective:    [name] today has, No headache, No chest pain, No abdominal pain - No Nausea, No new weakness tingling or numbness, Mild dry cough, still short of breath but somewhat improved.   Assessment  & Plan :      1. Acute hypoxic respiratory failure. Positive for influenza, placed on Tamiflu upon admission which will be continued, stop antibiotics, personally do not think patient is in frank fluid overload, Lactic acid is normal, nontoxic, do not think she is septic. Pulmonary requested to evaluate one time to comment on her pulmonary status.  2. Chronic atrial fibrillation. [place] vasc 2 score of at least 3. She was admitted hypotensive and RVR, cardiology was consulted, she is currently on digoxin for rate control, beta blocker calcium channel blocker cannot be used due to low blood pressure, pharmacy to monitor Coumadin..  3. Essential hypertension. Patient is somewhat hypotensive due to combination of #1 above along with RVR, ideology for now holding IV fluids, hold home diuretics, beta blocker and ACE, pulmonary requested to comment on fluid status and pulmonary status.  4. Dyslipidemia. Continue home dose statin.  5. Hypothyroidism. TSH was  suppressed have dropped Synthroid dose from 125 g daily 100 g daily, hold 2 doses.  6. Moderate to severe mitral stenosis and moderate AS - stable, monitor clinically, repeat echo if cardiology wants. Echo from 2015 noted. Cardiology following.  7. Chronic diastolic CHF. Last EF 60% in November 2015. Clinically to me she appears Compensated at this time. Cardiology following, pulmonary to evaluate as well.  8. Osteoarthritis. Supportive care    Code Status : DO NOT RESUSCITATE  Family Communication  : Granddaughter updated on admission date bedside  Disposition Plan  : Stay in stepdown  Barriers For Discharge : Shortness of breath, A. fib RVR, hypotension  Consults  :  Cardiology, pulmonary  Procedures  :   CT chest done upon admission. Diffuse interstitial pattern suspicious for ARDS/fluid overload/pneumonia.    DVT Prophylaxis  :  Coumadin  Lab Results  Component Value Date   INR 2.40* 02/18/2016   INR 2.47* 02/17/2016   INR 2.11* 02/17/2016   PROTIME  18.1 04/22/2009     Lab Results  Component Value Date   PLT 200 02/18/2016    Antibiotics  :     Anti-infectives    Start     Dose/Rate Route Frequency Ordered Stop   02/17/16 2200  oseltamivir (TAMIFLU) capsule 75 mg  Status:  Discontinued     75 mg Oral 2 times daily 02/17/16 1757 02/17/16 1804   02/17/16 2200  oseltamivir (TAMIFLU) capsule 30 mg     30 mg Oral 2 times daily 02/17/16 1804 02/22/16 2159   02/17/16 1815  cefTRIAXone (ROCEPHIN) 1 g in dextrose 5 % 50 mL IVPB  Status:  Discontinued     1 g 100 mL/hr over 30 Minutes Intravenous Every 24 hours 02/17/16 1812 02/18/16 1021   02/17/16 1800  azithromycin (ZITHROMAX) 500 mg in dextrose 5 % 250 mL IVPB  Status:  Discontinued     500 mg 250 mL/hr over 60 Minutes Intravenous Every 24 hours 02/17/16 1757 02/18/16 1021   02/17/16 1019  ceFEPIme (MAXIPIME) 2 g injection    Comments:  Modena Morrow   : cabinet override      02/17/16 1019 02/17/16 1024    02/17/16 1000  ceFEPIme (MAXIPIME) 2 g in dextrose 5 % 50 mL IVPB  Status:  Discontinued     2 g 100 mL/hr over 30 Minutes Intravenous  Once 02/17/16 0700 02/17/16 1812   02/17/16 0225  vancomycin (VANCOCIN) 500 MG powder    Comments:  Miguel Rota   : cabinet override      02/17/16 0225 02/17/16 1429   02/17/16 0152  ceFEPIme (MAXIPIME) 2 g injection    Comments:  Miguel Rota   : cabinet override      02/17/16 0152 02/17/16 0200   02/17/16 0130  ceFEPIme (MAXIPIME) 2 g in dextrose 5 % 50 mL IVPB     2 g 100 mL/hr over 30 Minutes Intravenous  Once 02/17/16 0129 02/17/16 0236   02/17/16 0130  vancomycin (VANCOCIN) 1,500 mg in sodium chloride 0.9 % 500 mL IVPB     1,500 mg 250 mL/hr over 120 Minutes Intravenous  Once 02/17/16 0129 02/17/16 0533        Objective:   Filed Vitals:   02/18/16 0530 02/18/16 0600 02/18/16 0700 02/18/16 0801  BP: 96/72 111/67 118/67 95/69  Pulse: 61 76 79 85  Temp:    99.4 F (37.4 C)  TempSrc:    Oral  Resp: 19 21 20 23   Height:      Weight:      SpO2: 100% 90% 100% 98%    Wt Readings from Last 3 Encounters:  02/18/16 85.3 kg (188 lb 0.8 oz)  02/15/16 81.647 kg (180 lb)  12/21/15 82.555 kg (182 lb)     Intake/Output Summary (Last 24 hours) at 02/18/16 1021 Last data filed at 02/18/16 0943  Gross per 24 hour  Intake 1246.38 ml  Output   1025 ml  Net 221.38 ml     Physical Exam  Awake Alert, Oriented X 3, No new F.N deficits, Normal affect [place].AT,PERRAL Supple Neck,No JVD, No cervical lymphadenopathy appriciated.  Symmetrical Chest wall movement, Good air movement bilaterally, +ve rales RRR,No Gallops,Rubs or new Murmurs, No Parasternal Heave +ve B.Sounds, Abd Soft, No tenderness, No organomegaly appriciated, No rebound - guarding or rigidity. No Cyanosis, Clubbing or edema, No new Rash or bruise       Data Review:    CBC  Recent Labs Lab 02/17/16 0130 02/18/16  0232  WBC 9.1 7.4  HGB 11.8* 10.5*  HCT 36.8 32.9*  PLT 236 200   MCV 94.8 94.0  MCH 30.4 30.0  MCHC 32.1 31.9  RDW 13.3 13.9  LYMPHSABS 0.4*  --   MONOABS 0.6  --   EOSABS 0.0  --   BASOSABS 0.0  --     Chemistries   Recent Labs Lab 02/17/16 0130 02/18/16 0232  NA 134* 137  K 4.5 4.0  CL 102 105  CO2 22 23  GLUCOSE 156* 144*  BUN 21* 14  CREATININE 0.99 0.87  CALCIUM 8.7* 8.3*  AST 23  --   ALT 17  --   ALKPHOS 70  --   BILITOT 1.5*  --    ------------------------------------------------------------------------------------------------------------------ No results for input(s): CHOL, HDL, LDLCALC, TRIG, CHOLHDL, LDLDIRECT in the last 72 hours.  Lab Results  Component Value Date   HGBA1C 6.2 03/24/2014   ------------------------------------------------------------------------------------------------------------------  Recent Labs  02/17/16 1902  TSH 0.293*   ------------------------------------------------------------------------------------------------------------------ No results for input(s): VITAMINB12, FOLATE, FERRITIN, TIBC, IRON, RETICCTPCT in the last 72 hours.  Coagulation profile  Recent Labs Lab 02/15/16 1119 02/17/16 0130 02/17/16 1902 02/18/16 0232  INR 1.8 2.11* 2.47* 2.40*    No results for input(s): DDIMER in the last 72 hours.  Cardiac Enzymes  Recent Labs Lab 02/17/16 0130  TROPONINI 0.03   ------------------------------------------------------------------------------------------------------------------    Component Value Date/Time   BNP 198.3* 02/17/2016 0130    Inpatient Medications  Scheduled Meds: . atorvastatin  20 mg Oral q1800  . benzonatate  100 mg Oral Q8H  . digoxin  0.125 mg Intravenous Q6H  . [START ON 02/19/2016] digoxin  0.125 mg Oral Daily  . furosemide  40 mg Oral Daily  . ketorolac  1 drop Right Eye BID  . [START ON 02/22/2016] levothyroxine  100 mcg Oral Daily  . oseltamivir  30 mg Oral BID  . potassium chloride SA  20 mEq Oral BID  . sodium chloride flush  3 mL  Intravenous Q12H  . vitamin B-12  1,000 mcg Oral Daily   Continuous Infusions:  PRN Meds:.acetaminophen, ALPRAZolam, bisacodyl, HYDROcodone-acetaminophen, ondansetron **OR** ondansetron (ZOFRAN) IV  Micro Results Recent Results (from the past 240 hour(s))  Blood Culture (routine x 2)     Status: None (Preliminary result)   Collection Time: 02/17/16  1:30 AM  Result Value Ref Range Status   Specimen Description BLOOD LEFT ANTECUBITAL  Final   Special Requests BOTTLES DRAWN AEROBIC AND ANAEROBIC 5ML  Final   Culture   Final    NO GROWTH 1 DAY Performed at [facility]    Report Status PENDING  Incomplete  Blood Culture (routine x 2)     Status: None (Preliminary result)   Collection Time: 02/17/16  1:35 AM  Result Value Ref Range Status   Specimen Description BLOOD RIGHT ANTECUBITAL  Final   Special Requests BOTTLES DRAWN AEROBIC AND ANAEROBIC 5ML  Final   Culture   Final    NO GROWTH 1 DAY Performed at [facility]    Report Status PENDING  Incomplete  MRSA PCR Screening     Status: None   Collection Time: 02/17/16  5:22 PM  Result Value Ref Range Status   MRSA by PCR NEGATIVE NEGATIVE Final    Comment:        The GeneXpert MRSA Assay (FDA approved for NASAL specimens only), is one component of a comprehensive MRSA colonization surveillance program. It is not intended to diagnose  MRSA infection nor to guide or monitor treatment for MRSA infections.     Radiology Reports Dg Chest 2 View  02/18/2016  CLINICAL DATA:  82 year old female with a history of CHF EXAM: CHEST - 2 VIEW COMPARISON:  Multiple prior, most recent chest x-ray 02/15/2016, CT 02/17/2016 FINDINGS: Cardiomediastinal silhouette unchanged, with cardiomegaly. Compared to the recent chest x-ray, there has been progression of bilateral, right greater than left multifocal airspace disease, in a distribution similar to the CT. No pneumothorax. Stigmata of emphysema, with increased retrosternal  airspace, flattened hemidiaphragms, increased AP diameter, and hyperinflation on the AP view. No large pleural effusion. Atherosclerosis of the aortic arch. Osteopenia. No displaced fracture identified. IMPRESSION: Multifocal airspace/interstitial opacities, right greater than left, concerning for pneumonia given the appearance. If the concern is for edema, the asymmetric pattern is atypical, though could represent acute mitral valve regurgitation, and correlation with echocardiography may be useful in this scenario. Atherosclerosis. Signed, Dulcy Fanny. Earleen Newport, DO [facility] Electronically Signed   By: Corrie Mckusick D.O.   On: 02/18/2016 08:38   Dg Chest 2 View  02/15/2016  CLINICAL DATA:  Cough and congestion. Chills in shortness of breath. EXAM: CHEST  2 VIEW COMPARISON:  12/15/2015 FINDINGS: Enlargement of the cardiac silhouette is unchanged. Thoracic aortic atherosclerosis is noted. The lungs remain hyperinflated with similar appearance of chronic interstitial changes suggestive of COPD. No acute airspace consolidation, edema, pleural effusion, or pneumothorax is identified. The no acute osseous abnormality is identified. IMPRESSION: Unchanged appearance of the chest without evidence of acute cardiopulmonary process. Electronically Signed   By: Logan Bores M.D.   On: 02/15/2016 22:23   Ct Chest Wo Contrast  02/17/2016  CLINICAL DATA:  Increasing cough, fever, chills, shortness of breath and generalize weakness for 4 days. EXAM: CT CHEST WITHOUT CONTRAST TECHNIQUE: Multidetector CT imaging of the chest was performed following the standard protocol without IV contrast. COMPARISON:  None. FINDINGS: Diffuse cardiac enlargement. Calcification in the mitral valve annulus, aortic valve, and Coronary arteries. Calcification in the aorta. No aortic dilatation. Prominent mediastinal lymph nodes with pretracheal node measuring 1.5 cm diameter. Probable  prominent right hilar nodes as well. These nodes may be reactive. Esophagus is decompressed. There is diffuse patchy airspace disease in the perihilar regions with interstitial thickening suggesting edema. Changes likely to represent pulmonary edema although bilateral pneumonia or ARDS could also have this appearance. Airways appear patent. No pleural effusions. No pneumothorax. Included portions of the upper abdominal organs suggest possible hepatic cirrhosis with enlarged lateral segment of the left lobe of liver. Degenerative changes in the spine. Anterior compression of T12. This is unchanged since previous CT abdomen and pelvis 12/15/2015. IMPRESSION: Cardiac enlargement with perihilar airspace and interstitial disease. This is likely due to edema although bilateral pneumonia or ARDS could also have this appearance. Electronically Signed   By: Lucienne Capers M.D.   On: 02/17/2016 03:15    Time Spent in minutes  35   Adison Jerger K M.D on 02/18/2016 at 10:21 AM  Between 7am to 7pm - Pager - [phone]  After 7pm go to [email or web address] - password [facility]  [facility] -  Office  [phone]

## 2016-02-18 NOTE — Progress Notes (Signed)
Subjective:  Her breathing is better today.  She had some hypotension last night and amiodarone was discontinued last night.  She was given some fluids and was placed on some digoxin.  Her atrial fibrillation is rate is better controlled today.  Objective:  Vital Signs in the last 24 hours: BP 95/69 mmHg  Pulse 85  Temp(Src) 99.4 F (37.4 C) (Oral)  Resp 23  Ht 5\' 11"  (1.803 m)  Wt 85.3 kg (188 lb 0.8 oz)  BMI 26.24 kg/m2  SpO2 98%  Physical Exam: Elderly female currently wearing oxygen but in no acute distress Lungs:  Diffuse bilateral crackles posteriorly Cardiac:  Irregular rhythm, normal S1 and S2, no S3, normal S1 and S2, 2/6 systolic murmur and 1 to 2/6 diastolic murmur Extremities:  Trace edema, changes of chronic venous stasis  Intake/Output from previous day: 03/24 0701 - 03/25 0700 In: 3616.4 [P.O.:440; I.V.:2713.9; IV Piggyback:462.5] Out: 1025 [Urine:1025] Weight Filed Weights   02/17/16 1724 02/18/16 0300  Weight: 85.548 kg (188 lb 9.6 oz) 85.3 kg (188 lb 0.8 oz)    Lab Results: Basic Metabolic Panel:  Recent Labs  02/17/16 0130 02/18/16 0232  NA 134* 137  K 4.5 4.0  CL 102 105  CO2 22 23  GLUCOSE 156* 144*  BUN 21* 14  CREATININE 0.99 0.87    CBC:  Recent Labs  02/17/16 0130 02/18/16 0232  WBC 9.1 7.4  NEUTROABS 8.0*  --   HGB 11.8* 10.5*  HCT 36.8 32.9*  MCV 94.8 94.0  PLT 236 200    BNP    Component Value Date/Time   BNP 198.3* 02/17/2016 0130    PROTIME: Lab Results  Component Value Date   INR 2.40* 02/18/2016   INR 2.47* 02/17/2016   INR 2.11* 02/17/2016   PROTIME 18.1 04/22/2009    Telemetry: Atrial fibrillation currently with controlled response  Assessment/Plan:   1.  Acute hypoxic respiratory failure currently improving 2.  Acute diastolic heart failure with mitral stenosis likely in the setting of rapid atrial fibrillation but appears improved 3.  Chronic atrial fibrillation rate is better controlled  today  Recommendations: Her amiodarone was stopped because of low blood pressure.  She is on dig now for rate control since her blood pressure was soft and was not able to tolerate other agents.  Continue treatment for influenza/pneumonia.  If rate goes back up we can just try her on some oral amiodarone which likely won't affect her pressure.       Kerry Hough  MD [facility] Cardiology  02/18/2016, 9:40 AM

## 2016-02-19 DIAGNOSIS — J9602 Acute respiratory failure with hypercapnia: Secondary | ICD-10-CM

## 2016-02-19 LAB — BASIC METABOLIC PANEL
ANION GAP: 10 (ref 5–15)
BUN: 15 mg/dL (ref 6–20)
CALCIUM: 8.5 mg/dL — AB (ref 8.9–10.3)
CO2: 22 mmol/L (ref 22–32)
CREATININE: 0.81 mg/dL (ref 0.44–1.00)
Chloride: 106 mmol/L (ref 101–111)
GFR calc non Af Amer: >60 mL/min (ref >60)
GLUCOSE: 103 mg/dL — AB (ref 65–99)
POTASSIUM: 4.1 mmol/L (ref 3.5–5.1)
Sodium: 138 mmol/L (ref 135–145)

## 2016-02-19 LAB — PROTIME-INR
INR: 2.24 — AB (ref 0.00–1.49)
Prothrombin Time: 24.6 seconds — ABNORMAL HIGH (ref 11.6–15.2)

## 2016-02-19 LAB — CBC
HEMATOCRIT: 31.1 % — AB (ref 36.0–46.0)
Hemoglobin: 10.1 g/dL — ABNORMAL LOW (ref 12.0–15.0)
MCH: 30.6 pg (ref 26.0–34.0)
MCHC: 32.5 g/dL (ref 30.0–36.0)
MCV: 94.2 fL (ref 78.0–100.0)
Platelets: 155 10*3/uL (ref 150–400)
RBC: 3.3 MIL/uL — ABNORMAL LOW (ref 3.87–5.11)
RDW: 13.7 % (ref 11.5–15.5)
WBC: 5 10*3/uL (ref 4.0–10.5)

## 2016-02-19 LAB — MAGNESIUM: Magnesium: 1.9 mg/dL (ref 1.7–2.4)

## 2016-02-19 MED ORDER — WARFARIN SODIUM 7.5 MG PO TABS
7.5000 mg | ORAL_TABLET | Freq: Once | ORAL | Status: AC
  Administered 2016-02-19: 7.5 mg via ORAL
  Filled 2016-02-19: qty 1

## 2016-02-19 MED ORDER — LOPERAMIDE HCL 2 MG PO CAPS
2.0000 mg | ORAL_CAPSULE | Freq: Four times a day (QID) | ORAL | Status: DC | PRN
  Administered 2016-02-19 – 2016-02-21 (×9): 2 mg via ORAL
  Filled 2016-02-19 (×8): qty 1

## 2016-02-19 MED ORDER — CARVEDILOL 6.25 MG PO TABS
6.2500 mg | ORAL_TABLET | Freq: Two times a day (BID) | ORAL | Status: DC
Start: 2016-02-19 — End: 2016-02-21
  Administered 2016-02-19 – 2016-02-21 (×4): 6.25 mg via ORAL
  Filled 2016-02-19 (×4): qty 1

## 2016-02-19 NOTE — Progress Notes (Signed)
Patient ID: [name], female   DOB: 1929/09/09, 82 y.o.   MRN: [id number] Subjective:  BP better AFib rate controlled Cough and dyspnea better   Objective:  Vital Signs in the last 24 hours: BP 140/98 mmHg  Pulse 76  Temp(Src) 97.9 F (36.6 C) (Oral)  Resp 16  Ht 5\' 11"  (1.803 m)  Wt 86.5 kg (190 lb 11.2 oz)  BMI 26.61 kg/m2  SpO2 92%  Physical Exam: Affect appropriate Elderly white female  HEENT: normal Neck supple with no adenopathy JVP normal no bruits no thyromegaly Lungs clear with no wheezing and good diaphragmatic motion Heart:  S1/S2 AS  murmur, no rub, gallop or click PMI normal Abdomen: benighn, BS positve, no tenderness, no AAA no bruit.  No HSM or HJR Distal pulses intact with no bruits No edema Neuro non-focal Skin warm and dry No muscular weakness    Intake/Output from previous day: 03/25 0701 - 03/26 0700 In: 240 [P.O.:240] Out: 1050 [Urine:1050] Weight Filed Weights   02/17/16 1724 02/18/16 0300 02/19/16 0314  Weight: 85.548 kg (188 lb 9.6 oz) 85.3 kg (188 lb 0.8 oz) 86.5 kg (190 lb 11.2 oz)    Lab Results: Basic Metabolic Panel:  Recent Labs  02/18/16 0232 02/19/16 0404  NA 137 138  K 4.0 4.1  CL 105 106  CO2 23 22  GLUCOSE 144* 103*  BUN 14 15  CREATININE 0.87 0.81    CBC:  Recent Labs  02/17/16 0130 02/18/16 0232 02/19/16 0404  WBC 9.1 7.4 5.0  NEUTROABS 8.0*  --   --   HGB 11.8* 10.5* 10.1*  HCT 36.8 32.9* 31.1*  MCV 94.8 94.0 94.2  PLT 236 200 155    BNP    Component Value Date/Time   BNP 198.3* 02/17/2016 0130    PROTIME: Lab Results  Component Value Date   INR 2.24* 02/19/2016   INR 2.40* 02/18/2016   INR 2.47* 02/17/2016   PROTIME 18.1 04/22/2009    Telemetry: Atrial fibrillation currently with controlled response 80's 02/19/2016  . atorvastatin  20 mg Oral q1800  . benzonatate  100 mg Oral Q8H  . dextromethorphan  30 mg Oral BID  . digoxin  0.125 mg Oral Daily  . furosemide  40 mg Oral Daily   . ketorolac  1 drop Right Eye BID  . [START ON 02/22/2016] levothyroxine  100 mcg Oral Daily  . oseltamivir  30 mg Oral BID  . potassium chloride SA  20 mEq Oral BID  . sodium chloride flush  3 mL Intravenous Q12H  . vitamin B-12  1,000 mcg Oral Daily  . Warfarin - Pharmacist Dosing Inpatient   Does not apply q1800    Assessment/Plan:   1.  Acute hypoxic respiratory failure currently improving 2.  Acute diastolic heart failure with mitral stenosis likely in the setting of rapid atrial fibrillation but appears improved 3.  Chronic atrial fibrillation rate is better controlled today  Recommendations: INR Rx  Continue coumadin.  Afib is chronic and long standing  Stop digoxin and resume home dose of coreg    [employer]

## 2016-02-19 NOTE — Progress Notes (Signed)
Utilization review completed.  

## 2016-02-19 NOTE — Progress Notes (Signed)
ANTICOAGULATION CONSULT NOTE - Follow-up Consult  Pharmacy Consult for Warfarin Indication: atrial fibrillation  Allergies  Allergen Reactions  . Codeine Nausea Only    REACTION: nausea    Patient Measurements: Height: 5\' 11"  (180.3 cm) Weight: 190 lb 11.2 oz (86.5 kg) IBW/kg (Calculated) : 70.8 Heparin Dosing Weight: 85kg  Vital Signs: Temp: 97.9 F (36.6 C) (03/26 0727) Temp Source: Oral (03/26 0727) BP: 140/98 mmHg (03/26 0727) Pulse Rate: 76 (03/26 0727)  Labs:  Recent Labs  02/17/16 0130 02/17/16 1902 02/18/16 0232 02/19/16 0404  HGB 11.8*  --  10.5* 10.1*  HCT 36.8  --  32.9* 31.1*  PLT 236  --  200 155  LABPROT 23.5* 26.5* 25.8* 24.6*  INR 2.11* 2.47* 2.40* 2.24*  CREATININE 0.99  --  0.87 0.81  TROPONINI 0.03  --   --   --     Estimated Creatinine Clearance: 62.9 mL/min (by C-G formula based on Cr of 0.81).   Medical History: Past Medical History  Diagnosis Date  . Hypertension   . Chest pain, unspecified   . Atrial fibrillation ([place])   . Mitral valve disorder   . Patent foramen ovale   . Peripheral vascular disease ([place])   . Venous insufficiency   . Hyperlipidemia   . Impaired glucose tolerance   . Hypothyroid   . Diverticulosis of colon   . History of colonic polyps   . History of colon cancer   . Splenic infarction   . History of uterine cancer   . UTI (lower urinary tract infection)   . Osteoarthritis   . Chronic low back pain   . Osteoporosis   . Peripheral neuropathy ([place])   . Anxiety   . Vitamin B12 deficiency   . History of colonoscopy   . Cataract     REMOVED BILATERAL    Assessment: 82 y.o. female, History of chronic atrial fibrillation on Coumadin, admitted with SOB. Pharmacy consulted to restart Warfarin. Will doing using home regimen  INR 2.40, Hgb 10.5, plt wnl  PTA warfarin dosing: 7.5mg  on Sunday and 5mg  all other days  Plan:  Warfarin 7.5 mg tonight Daily INR/CBC  Darl Pikes, PharmD Clinical  Pharmacist- Resident Pager: [phone]  02/19/2016,10:14 AM

## 2016-02-19 NOTE — Progress Notes (Signed)
Report called to Ginger RN on 5W.

## 2016-02-19 NOTE — Progress Notes (Signed)
[name] ED:[id number] Admission Data: 02/19/2016 10:43 AM Attending Provider: Thurnell Lose, MD  PCP:FRY,STEPHEN A, MD Consults/ Treatment Team: Treatment Team:  Sherren Mocha, MD  [name] is a 82 y.o. female patient admitted from ED awake, alert  & orientated  X 3,  DNR, VSS - Blood pressure 140/98, pulse 76, temperature 97.9 F (36.6 C), temperature source Oral, resp. rate 16, height 5\' 11"  (1.803 m), weight 86.5 kg (190 lb 11.2 oz), SpO2 92 %., no c/o shortness of breath, no c/o chest pain, no distress noted.   IV site WDL:  antecubital right, condition patent and no redness and left, condition patent and no redness with a transparent dsg that's clean dry and intact.  Allergies:   Allergies  Allergen Reactions  . Codeine Nausea Only    REACTION: nausea     Past Medical History  Diagnosis Date  . Hypertension   . Chest pain, unspecified   . Atrial fibrillation ([place])   . Mitral valve disorder   . Patent foramen ovale   . Peripheral vascular disease ([place])   . Venous insufficiency   . Hyperlipidemia   . Impaired glucose tolerance   . Hypothyroid   . Diverticulosis of colon   . History of colonic polyps   . History of colon cancer   . Splenic infarction   . History of uterine cancer   . UTI (lower urinary tract infection)   . Osteoarthritis   . Chronic low back pain   . Osteoporosis   . Peripheral neuropathy ([place])   . Anxiety   . Vitamin B12 deficiency   . History of colonoscopy   . Cataract     REMOVED BILATERAL    History:  obtained from the patient. Tobacco/alcohol: denied none  Pt orientation to unit, room and routine. Information packet given to patient/family and safety video watched.  Admission INP armband ID verified with patient/family, and in place. SR up x 2, fall risk assessment complete with Patient and family verbalizing understanding of risks associated with falls. Pt verbalizes an understanding of how to use the call bell and to call for  help before getting out of bed.  Skin, clean-dry- intact without evidence of bruising, or skin tears.   No evidence of skin break down noted on exam. no rashes, no ecchymoses, no petechiae    Will cont to monitor and assist as needed.  [name] Chloe Sheffield, RN 02/19/2016 10:43 AM

## 2016-02-19 NOTE — Progress Notes (Signed)
TR[facility] Progress Note                                            Patient Demographics:    [name], is a 82 y.o. female, DOB - 11-30-1928, KD:[id number]  Admit date - 02/17/2016   Admitting Physician Etta Quill, DO  Outpatient Primary MD for the patient is Laurey Morale, MD  LOS - 2  Outpatient Specialists: Dr Johnsie Cancel  Chief Complaint  Patient presents with  . Shortness of Breath        Subjective:    Niti Kohtz today has, No headache, No chest pain, No abdominal pain - No Nausea, No new weakness tingling or numbness, Mild dry cough, still short of breath but somewhat improved.   Assessment  & Plan :      1. Acute hypoxic respiratory failure. Positive for influenza, placed on Tamiflu upon admission which will be continued, stop antibiotics, personally do not think patient was in frank fluid overload, Lactic acid is normal, nontoxic, do not think she is septic.She improved after 2Lit IVF in ER, was on 2lit [place] 2 hrs post IVF blous, continue Tamiflu and low dose Po lasix, seen by Cards and PCCM.  2. Chronic atrial fibrillation. [place] vasc 2 score of at least 3. She was admitted hypotensive and RVR, cardiology was consulted, she is currently on digoxin for rate control, BP now better, will have Cards adjust Meds, pharmacy to monitor Coumadin..  3. Essential hypertension. On admission she was hypotensive due to combination of #1 above along with RVR, after IVF and Rate control BP better, monitor.  4. Dyslipidemia. Continue home dose statin.  5. Hypothyroidism. TSH was suppressed have dropped Synthroid dose decreased from 125 g daily 100 g  daily, held 2 doses.  6. Moderate to severe mitral stenosis and moderate AS - stable, monitor clinically, repeat echo if cardiology wants. Echo from 2015 noted. Cardiology following.  7. Chronic diastolic CHF. Last EF 60% in November 2015. Clinically to me she appears Compensated at this time. Cardiology following, pulmonary to evaluated as well. Placed on 40mg  PO lasix by cards, follow.  8. Osteoarthritis. Supportive care  9. L Foot cast - for charcot foot repair, follow with Dr Doran Durand in 1-2 weeks.    Code Status : DO NOT RESUSCITATE  Family Communication  : Granddaughter updated on admission date bedside  Disposition Plan  : Med Surg bed, PT, likely DC in am  Barriers For Discharge : Shortness of breath, A. fib RVR, hypotension  Consults  :  Cardiology, pulmonary  Procedures  :   CT chest done upon admission. Diffuse interstitial pattern suspicious for ARDS/fluid overload/pneumonia.    DVT Prophylaxis  :  Coumadin  Lab Results  Component Value Date   INR 2.24* 02/19/2016   INR 2.40* 02/18/2016   INR 2.47* 02/17/2016   PROTIME 18.1 04/22/2009     Lab Results  Component Value Date   PLT 155 02/19/2016    Antibiotics  :     Anti-infectives    Start     Dose/Rate Route Frequency Ordered Stop   02/17/16 2200  oseltamivir (TAMIFLU) capsule 75 mg  Status:  Discontinued     75 mg Oral 2 times daily 02/17/16 1757 02/17/16 1804   02/17/16 2200  oseltamivir (TAMIFLU) capsule 30 mg     30 mg Oral 2 times daily 02/17/16 1804 02/22/16 2159   02/17/16 1815  cefTRIAXone (ROCEPHIN) 1 g in dextrose 5 % 50 mL IVPB  Status:  Discontinued     1 g 100 mL/hr over 30 Minutes Intravenous Every 24 hours 02/17/16 1812 02/18/16 1021   02/17/16 1800  azithromycin (ZITHROMAX) 500 mg in dextrose 5 % 250 mL IVPB  Status:  Discontinued     500 mg 250 mL/hr over 60 Minutes Intravenous Every 24 hours 02/17/16 1757 02/18/16 1021   02/17/16 1019  ceFEPIme (MAXIPIME) 2 g injection      Comments:  Modena Morrow   : cabinet override      02/17/16 1019 02/17/16 1024   02/17/16 1000  ceFEPIme (MAXIPIME) 2 g in dextrose 5 % 50 mL IVPB  Status:  Discontinued     2 g 100 mL/hr over 30 Minutes Intravenous  Once 02/17/16 0700 02/17/16 1812   02/17/16 0225  vancomycin (VANCOCIN) 500 MG powder    Comments:  Miguel Rota   : cabinet override      02/17/16 0225 02/17/16 1429   02/17/16 0152  ceFEPIme (MAXIPIME) 2 g injection    Comments:  Miguel Rota   : cabinet override      02/17/16 0152 02/17/16 0200   02/17/16 0130  ceFEPIme (MAXIPIME) 2 g in dextrose 5 % 50 mL IVPB     2 g 100 mL/hr over 30 Minutes Intravenous  Once 02/17/16 0129 02/17/16 0236   02/17/16 0130  vancomycin (VANCOCIN) 1,500 mg in sodium chloride 0.9 % 500 mL IVPB     1,500 mg 250 mL/hr over 120 Minutes Intravenous  Once 02/17/16 0129 02/17/16 0533        Objective:   Filed Vitals:   02/18/16 1922 02/18/16 2351 02/19/16 0314 02/19/16 0727  BP: 101/67 92/47 107/57 140/98  Pulse: 103 80 71 76  Temp: 97.8 F (36.6 C) 98.2 F (36.8 C) 98.3 F (36.8 C) 97.9 F (36.6 C)  TempSrc: Oral Oral Oral Oral  Resp: 21 21 21 16   Height:      Weight:   86.5 kg (190 lb 11.2 oz)   SpO2: 94% 94% 97% 92%    Wt Readings from Last 3 Encounters:  02/19/16 86.5 kg (190 lb 11.2 oz)  02/15/16 81.647 kg (180 lb)  12/21/15 82.555 kg (182 lb)     Intake/Output Summary (Last 24 hours) at 02/19/16 0910 Last data filed at 02/19/16 0353  Gross per 24 hour  Intake    240 ml  Output   1050 ml  Net   -810 ml     Physical Exam  Awake Alert, Oriented X 3, No new F.N deficits, Normal affect [place].AT,PERRAL Supple Neck,No JVD, No cervical lymphadenopathy appriciated.  Symmetrical Chest wall movement, Good air movement bilaterally, +ve rales RRR,No Gallops,Rubs or new Murmurs, No  Parasternal Heave +ve B.Sounds, Abd Soft, No tenderness, No organomegaly appriciated, No rebound - guarding or rigidity. No Cyanosis, Clubbing or  edema, No new Rash or bruise, L foot in cast    Data Review:    CBC  Recent Labs Lab 02/17/16 0130 02/18/16 0232 02/19/16 0404  WBC 9.1 7.4 5.0  HGB 11.8* 10.5* 10.1*  HCT 36.8 32.9* 31.1*  PLT 236 200 155  MCV 94.8 94.0 94.2  MCH 30.4 30.0 30.6  MCHC 32.1 31.9 32.5  RDW 13.3 13.9 13.7  LYMPHSABS 0.4*  --   --   MONOABS 0.6  --   --   EOSABS 0.0  --   --   BASOSABS 0.0  --   --     Chemistries   Recent Labs Lab 02/17/16 0130 02/18/16 0232 02/19/16 0404  NA 134* 137 138  K 4.5 4.0 4.1  CL 102 105 106  CO2 22 23 22   GLUCOSE 156* 144* 103*  BUN 21* 14 15  CREATININE 0.99 0.87 0.81  CALCIUM 8.7* 8.3* 8.5*  MG  --   --  1.9  AST 23  --   --   ALT 17  --   --   ALKPHOS 70  --   --   BILITOT 1.5*  --   --    ------------------------------------------------------------------------------------------------------------------ No results for input(s): CHOL, HDL, LDLCALC, TRIG, CHOLHDL, LDLDIRECT in the last 72 hours.  Lab Results  Component Value Date   HGBA1C 6.2 03/24/2014   ------------------------------------------------------------------------------------------------------------------  Recent Labs  02/17/16 1902  TSH 0.293*   ------------------------------------------------------------------------------------------------------------------ No results for input(s): VITAMINB12, FOLATE, FERRITIN, TIBC, IRON, RETICCTPCT in the last 72 hours.  Coagulation profile  Recent Labs Lab 02/15/16 1119 02/17/16 0130 02/17/16 1902 02/18/16 0232 02/19/16 0404  INR 1.8 2.11* 2.47* 2.40* 2.24*    No results for input(s): DDIMER in the last 72 hours.  Cardiac Enzymes  Recent Labs Lab 02/17/16 0130  TROPONINI 0.03   ------------------------------------------------------------------------------------------------------------------    Component Value Date/Time   BNP 198.3* 02/17/2016 0130    Inpatient Medications  Scheduled Meds: . atorvastatin  20 mg  Oral q1800  . benzonatate  100 mg Oral Q8H  . dextromethorphan  30 mg Oral BID  . digoxin  0.125 mg Oral Daily  . furosemide  40 mg Oral Daily  . ketorolac  1 drop Right Eye BID  . [START ON 02/22/2016] levothyroxine  100 mcg Oral Daily  . oseltamivir  30 mg Oral BID  . potassium chloride SA  20 mEq Oral BID  . sodium chloride flush  3 mL Intravenous Q12H  . vitamin B-12  1,000 mcg Oral Daily  . Warfarin - Pharmacist Dosing Inpatient   Does not apply q1800   Continuous Infusions:  PRN Meds:.acetaminophen, ALPRAZolam, bisacodyl, HYDROcodone-acetaminophen, loperamide, ondansetron **OR** ondansetron (ZOFRAN) IV  Micro Results Recent Results (from the past 240 hour(s))  Blood Culture (routine x 2)     Status: None (Preliminary result)   Collection Time: 02/17/16  1:30 AM  Result Value Ref Range Status   Specimen Description BLOOD LEFT ANTECUBITAL  Final   Special Requests BOTTLES DRAWN AEROBIC AND ANAEROBIC 5ML  Final   Culture   Final    NO GROWTH 1 DAY Performed at [facility]    Report Status PENDING  Incomplete  Blood Culture (routine x 2)     Status: None (Preliminary result)   Collection Time: 02/17/16  1:35 AM  Result Value Ref Range Status  Specimen Description BLOOD RIGHT ANTECUBITAL  Final   Special Requests BOTTLES DRAWN AEROBIC AND ANAEROBIC 5ML  Final   Culture   Final    NO GROWTH 1 DAY Performed at [facility]    Report Status PENDING  Incomplete  Urine culture     Status: None   Collection Time: 02/17/16  2:13 AM  Result Value Ref Range Status   Specimen Description URINE, CATHETERIZED  Final   Special Requests NONE  Final   Culture   Final    NO GROWTH 1 DAY Performed at [facility]    Report Status 02/18/2016 FINAL  Final  MRSA PCR Screening     Status: None   Collection Time: 02/17/16  5:22 PM  Result Value Ref Range Status   MRSA by PCR NEGATIVE NEGATIVE Final    Comment:        The GeneXpert MRSA Assay (FDA approved for  NASAL specimens only), is one component of a comprehensive MRSA colonization surveillance program. It is not intended to diagnose MRSA infection nor to guide or monitor treatment for MRSA infections.     Radiology Reports Dg Chest 2 View  02/18/2016  CLINICAL DATA:  82 year old female with a history of CHF EXAM: CHEST - 2 VIEW COMPARISON:  Multiple prior, most recent chest x-ray 02/15/2016, CT 02/17/2016 FINDINGS: Cardiomediastinal silhouette unchanged, with cardiomegaly. Compared to the recent chest x-ray, there has been progression of bilateral, right greater than left multifocal airspace disease, in a distribution similar to the CT. No pneumothorax. Stigmata of emphysema, with increased retrosternal airspace, flattened hemidiaphragms, increased AP diameter, and hyperinflation on the AP view. No large pleural effusion. Atherosclerosis of the aortic arch. Osteopenia. No displaced fracture identified. IMPRESSION: Multifocal airspace/interstitial opacities, right greater than left, concerning for pneumonia given the appearance. If the concern is for edema, the asymmetric pattern is atypical, though could represent acute mitral valve regurgitation, and correlation with echocardiography may be useful in this scenario. Atherosclerosis. Signed, Dulcy Fanny. Earleen Newport, DO [facility] [place] [facility] Electronically Signed   By: Corrie Mckusick D.O.   On: 02/18/2016 08:38   Dg Chest 2 View  02/15/2016  CLINICAL DATA:  Cough and congestion. Chills in shortness of breath. EXAM: CHEST  2 VIEW COMPARISON:  12/15/2015 FINDINGS: Enlargement of the cardiac silhouette is unchanged. Thoracic aortic atherosclerosis is noted. The lungs remain hyperinflated with similar appearance of chronic interstitial changes suggestive of COPD. No acute airspace consolidation, edema, pleural effusion, or pneumothorax is identified. The no acute osseous abnormality is identified. IMPRESSION:  Unchanged appearance of the chest without evidence of acute cardiopulmonary process. Electronically Signed   By: Logan Bores M.D.   On: 02/15/2016 22:23   Ct Chest Wo Contrast  02/17/2016  CLINICAL DATA:  Increasing cough, fever, chills, shortness of breath and generalize weakness for 4 days. EXAM: CT CHEST WITHOUT CONTRAST TECHNIQUE: Multidetector CT imaging of the chest was performed following the standard protocol without IV contrast. COMPARISON:  None. FINDINGS: Diffuse cardiac enlargement. Calcification in the mitral valve annulus, aortic valve, and Coronary arteries. Calcification in the aorta. No aortic dilatation. Prominent mediastinal lymph nodes with pretracheal node measuring 1.5 cm diameter. Probable prominent right hilar nodes as well. These nodes may be reactive. Esophagus is decompressed. There is diffuse patchy airspace disease in the perihilar regions with interstitial thickening suggesting edema. Changes likely to represent pulmonary edema although bilateral pneumonia or ARDS could also have this appearance. Airways appear patent. No pleural effusions. No  pneumothorax. Included portions of the upper abdominal organs suggest possible hepatic cirrhosis with enlarged lateral segment of the left lobe of liver. Degenerative changes in the spine. Anterior compression of T12. This is unchanged since previous CT abdomen and pelvis 12/15/2015. IMPRESSION: Cardiac enlargement with perihilar airspace and interstitial disease. This is likely due to edema although bilateral pneumonia or ARDS could also have this appearance. Electronically Signed   By: Lucienne Capers M.D.   On: 02/17/2016 03:15    Time Spent in minutes  35   SINGH,PRASHANT K M.D on 02/19/2016 at 9:10 AM  Between 7am to 7pm - Pager - [phone]  After 7pm go to [email or web address] - password [email or web address]  [facility] -  Office  [phone]

## 2016-02-20 DIAGNOSIS — I481 Persistent atrial fibrillation: Secondary | ICD-10-CM

## 2016-02-20 LAB — PROTIME-INR
INR: 2.45 — AB (ref 0.00–1.49)
PROTHROMBIN TIME: 26.3 s — AB (ref 11.6–15.2)

## 2016-02-20 LAB — CBC
HCT: 33 % — ABNORMAL LOW (ref 36.0–46.0)
HEMOGLOBIN: 10.6 g/dL — AB (ref 12.0–15.0)
MCH: 29.9 pg (ref 26.0–34.0)
MCHC: 32.1 g/dL (ref 30.0–36.0)
MCV: 93 fL (ref 78.0–100.0)
PLATELETS: 193 10*3/uL (ref 150–400)
RBC: 3.55 MIL/uL — AB (ref 3.87–5.11)
RDW: 13.4 % (ref 11.5–15.5)
WBC: 4.5 10*3/uL (ref 4.0–10.5)

## 2016-02-20 MED ORDER — WARFARIN SODIUM 7.5 MG PO TABS: 7.5000 mg | ORAL_TABLET | ORAL | Status: DC

## 2016-02-20 MED ORDER — WARFARIN SODIUM 5 MG PO TABS
5.0000 mg | ORAL_TABLET | ORAL | Status: DC
  Administered 2016-02-20: 5 mg via ORAL
  Filled 2016-02-20: qty 1

## 2016-02-20 NOTE — Evaluation (Signed)
Physical Therapy Evaluation Patient Details Name: [name] MRN: [id number] DOB: Feb 22, 1929 Today's Date: 02/20/2016   History of Present Illness  Pt adm with acute respiratory failure due to flu and PNA. PMH - HTN, Peripheral neuropathy, colon CA, Charcot foot on lt  Clinical Impression  Pt doing well with mobility and no further PT needed.  Pt able to return home when medically ready.      Follow Up Recommendations No PT follow up    Equipment Recommendations  None recommended by PT    Recommendations for Other Services       Precautions / Restrictions Precautions Precautions: None Required Braces or Orthoses: Other Brace/Splint Other Brace/Splint: Cast and post op shoe on lt foot Restrictions Weight Bearing Restrictions: No      Mobility  Bed Mobility               General bed mobility comments: Pt up in recliner  Transfers Overall transfer level: Modified independent Equipment used: Straight cane                Ambulation/Gait Ambulation/Gait assistance: Modified independent (Device/Increase time) Ambulation Distance (Feet): 225 Feet Assistive device: Straight cane Gait Pattern/deviations: Step-through pattern;Decreased stance time - left     General Gait Details: Steady gait with cane. Pt with SpO2 95% on RA after amb  Stairs            Wheelchair Mobility    Modified Rankin (Stroke Patients Only)       Balance Overall balance assessment: Modified Independent                                           Pertinent Vitals/Pain Pain Assessment: No/denies pain    Home Living Family/patient expects to be discharged to:: Private residence Living Arrangements: Alone Available Help at Discharge: Friend(s);Family;Available PRN/intermittently Type of Home: Apartment Home Access: Other (comment)     Home Layout: One level Home Equipment: Cane - single point Additional Comments: has a dotson dog named "miss  [name]" works at [employer] part time drives    Prior Function Level of Independence: Independent with assistive device(s)         Comments: Uses cane when out since lt foot problems     Hand Dominance   Dominant Hand: Right    Extremity/Trunk Assessment   Upper Extremity Assessment: Overall WFL for tasks assessed           Lower Extremity Assessment: Overall WFL for tasks assessed;LLE deficits/detail   LLE Deficits / Details: lt foot and ankle casted     Communication   Communication: No difficulties  Cognition Arousal/Alertness: Awake/alert Behavior During Therapy: WFL for tasks assessed/performed Overall Cognitive Status: Within Functional Limits for tasks assessed                      General Comments      Exercises        Assessment/Plan    PT Assessment Patent does not need any further PT services  PT Diagnosis Difficulty walking   PT Problem List    PT Treatment Interventions     PT Goals (Current goals can be found in the Care Plan section) Acute Rehab PT Goals PT Goal Formulation: All assessment and education complete, DC therapy    Frequency     Barriers to discharge  Co-evaluation               End of Session   Activity Tolerance: Patient tolerated treatment well Patient left: in chair;with call bell/phone within reach Nurse Communication: Mobility status         Time: [id number] PT Time Calculation (min) (ACUTE ONLY): 22 min   Charges:   PT Evaluation $PT Eval Moderate Complexity: 1 Procedure     PT G Codes:        Lynnzie Blackson February 22, 2016, 10:06 AM Suanne Marker PT [phone]

## 2016-02-20 NOTE — Progress Notes (Signed)
ANTICOAGULATION CONSULT NOTE - Follow Up Consult  Pharmacy Consult for Coumadin Indication: atrial fibrillation  Allergies  Allergen Reactions  . Codeine Nausea Only    REACTION: nausea    Patient Measurements: Height: 5\' 11"  (180.3 cm) Weight: 189 lb 6 oz (85.9 kg) IBW/kg (Calculated) : 70.8 Heparin Dosing Weight:   Vital Signs: Temp: 98 F (36.7 C) (03/27 0544) Temp Source: Oral (03/27 0544) BP: 124/64 mmHg (03/27 0544) Pulse Rate: 67 (03/27 0544)  Labs:  Recent Labs  02/18/16 0232 02/19/16 0404 02/20/16 0441  HGB 10.5* 10.1* 10.6*  HCT 32.9* 31.1* 33.0*  PLT 200 155 193  LABPROT 25.8* 24.6* 26.3*  INR 2.40* 2.24* 2.45*  CREATININE 0.87 0.81  --     Estimated Creatinine Clearance: 62.7 mL/min (by C-G formula based on Cr of 0.81).   Medications:  Scheduled:  . atorvastatin  20 mg Oral q1800  . benzonatate  100 mg Oral Q8H  . carvedilol  6.25 mg Oral BID WC  . dextromethorphan  30 mg Oral BID  . furosemide  40 mg Oral Daily  . ketorolac  1 drop Right Eye BID  . [START ON 02/22/2016] levothyroxine  100 mcg Oral Daily  . oseltamivir  30 mg Oral BID  . potassium chloride SA  20 mEq Oral BID  . sodium chloride flush  3 mL Intravenous Q12H  . vitamin B-12  1,000 mcg Oral Daily  . Warfarin - Pharmacist Dosing Inpatient   Does not apply q1800    Assessment: 82yo female with AFib, on Coumadin.  INR therapeutic after resumed on 3/25.  No bleeding noted.  Hg stable and pltc wnl.  Goal of Therapy:  INR 2-3 Monitor platelets by anticoagulation protocol: Yes   Plan:  Coumadin 5mg  daily, except 7.5mg  on Sunday Daily INR Watch for s/s of bleeding  Gracy Bruins, PharmD [facility]

## 2016-02-20 NOTE — Progress Notes (Signed)
    Note reviewed. Continue with current plan. No alterations currently.  We will sign off. Please call us if you have any questions.  Candee Furbish, MD

## 2016-02-20 NOTE — Progress Notes (Signed)
TR[facility] Progress Note                                            Patient Demographics:    [name], is a 82 y.o. female, DOB - 1929/04/18, [id number]  Admit date - 02/17/2016   Admitting Physician Etta Quill, DO  Outpatient Primary MD for the patient is Laurey Morale, MD  LOS - 3  Outpatient Specialists: Dr Johnsie Cancel  Chief Complaint  Patient presents with  . Shortness of Breath        Subjective:    [name] today has, No headache, No chest pain, No abdominal pain - No Nausea, No new weakness tingling or numbness, Mild dry cough, still short of breath but somewhat improved.   Assessment  & Plan :      1. Acute hypoxic respiratory failure. Positive for influenza, placed on Tamiflu upon admission which will be continued, stop antibiotics, personally do not think patient was in frank fluid overload, Lactic acid is normal, nontoxic, do not think she is septic.She improved after 2Lit IVF in ER, was on 2lit [place] 2 hrs post IVF blous, continue Tamiflu and low dose Po lasix, seen by Cards and PCCM.  2. Chronic atrial fibrillation. [place] vasc 2 score of at least 3. She was admitted hypotensive and RVR, cardiology was consulted, she is currently on B blcoker for rate control, BP now better, will have Cards adjust Meds, pharmacy to monitor Coumadin. She initially required amiodarone and digoxin which have now been stopped by cardiology.  3. Essential hypertension. On admission she was hypotensive due to combination of #1 above along with RVR, after IVF and Rate control BP better, monitor.  4. Dyslipidemia. Continue home dose statin.  5.  Hypothyroidism. TSH was suppressed have dropped Synthroid dose decreased from 125 g daily 100 g daily, held 2 doses.  6. Moderate to severe mitral stenosis and moderate AS - stable, monitor clinically, repeat echo if cardiology wants. Echo from 2015 noted. Cardiology following.  7. Chronic diastolic CHF. Last EF 60% in November 2015. Clinically to me she appears Compensated at this time. Cardiology following, pulmonary to evaluated as well. Placed on 40mg  PO lasix by cards, follow.  8. Osteoarthritis. Supportive care  9. L Foot cast - for charcot foot repair, follow with Dr Doran Durand in 1-2 weeks.    Code Status : DO NOT RESUSCITATE  Family Communication  : Granddaughter updated on admission date bedside  Disposition Plan  : likely DC in am  Barriers For Discharge : None  Consults  :  Cardiology, pulmonary  Procedures  :   CT chest done upon admission. Diffuse interstitial pattern suspicious for ARDS/fluid overload/pneumonia.  DVT Prophylaxis  :  Coumadin  Lab Results  Component Value Date   INR 2.45* 02/20/2016   INR 2.24* 02/19/2016   INR 2.40* 02/18/2016   PROTIME 18.1 04/22/2009     Lab Results  Component Value Date   PLT 193 02/20/2016    Antibiotics  :     Anti-infectives    Start     Dose/Rate Route Frequency Ordered Stop   02/17/16 2200  oseltamivir (TAMIFLU) capsule 75 mg  Status:  Discontinued     75 mg Oral 2 times daily 02/17/16 1757 02/17/16 1804   02/17/16 2200  oseltamivir (TAMIFLU) capsule 30 mg     30 mg Oral 2 times daily 02/17/16 1804 02/22/16 2159   02/17/16 1815  cefTRIAXone (ROCEPHIN) 1 g in dextrose 5 % 50 mL IVPB  Status:  Discontinued     1 g 100 mL/hr over 30 Minutes Intravenous Every 24 hours 02/17/16 1812 02/18/16 1021   02/17/16 1800  azithromycin (ZITHROMAX) 500 mg in dextrose 5 % 250 mL IVPB  Status:  Discontinued     500 mg 250 mL/hr over 60 Minutes Intravenous Every 24 hours 02/17/16 1757 02/18/16 1021   02/17/16 1019   ceFEPIme (MAXIPIME) 2 g injection    Comments:  Modena Morrow   : cabinet override      02/17/16 1019 02/17/16 1024   02/17/16 1000  ceFEPIme (MAXIPIME) 2 g in dextrose 5 % 50 mL IVPB  Status:  Discontinued     2 g 100 mL/hr over 30 Minutes Intravenous  Once 02/17/16 0700 02/17/16 1812   02/17/16 0225  vancomycin (VANCOCIN) 500 MG powder    Comments:  Miguel Rota   : cabinet override      02/17/16 0225 02/17/16 1429   02/17/16 0152  ceFEPIme (MAXIPIME) 2 g injection    Comments:  Miguel Rota   : cabinet override      02/17/16 0152 02/17/16 0200   02/17/16 0130  ceFEPIme (MAXIPIME) 2 g in dextrose 5 % 50 mL IVPB     2 g 100 mL/hr over 30 Minutes Intravenous  Once 02/17/16 0129 02/17/16 0236   02/17/16 0130  vancomycin (VANCOCIN) 1,500 mg in sodium chloride 0.9 % 500 mL IVPB     1,500 mg 250 mL/hr over 120 Minutes Intravenous  Once 02/17/16 0129 02/17/16 0533        Objective:   Filed Vitals:   02/19/16 1315 02/19/16 1811 02/19/16 2225 02/20/16 0544  BP: 120/78 132/64 113/63 124/64  Pulse: 64  64 67  Temp: 98.5 F (36.9 C)  97.7 F (36.5 C) 98 F (36.7 C)  TempSrc: Oral  Oral Oral  Resp: 17  18 18   Height:      Weight:    85.9 kg (189 lb 6 oz)  SpO2: 97%  94% 95%    Wt Readings from Last 3 Encounters:  02/20/16 85.9 kg (189 lb 6 oz)  02/15/16 81.647 kg (180 lb)  12/21/15 82.555 kg (182 lb)     Intake/Output Summary (Last 24 hours) at 02/20/16 1047 Last data filed at 02/20/16 1018  Gross per 24 hour  Intake    240 ml  Output   1000 ml  Net   -760 ml     Physical Exam  Awake Alert, Oriented X 3, No new F.N deficits, Normal affect [place].AT,PERRAL Supple Neck,No JVD, No cervical lymphadenopathy appriciated.  Symmetrical Chest wall movement, Good air movement bilaterally, +ve rales RRR,No Gallops,Rubs or new Murmurs,  No Parasternal Heave +ve B.Sounds, Abd Soft, No tenderness, No organomegaly appriciated, No rebound - guarding or rigidity. No Cyanosis, Clubbing or  edema, No new Rash or bruise, L foot in cast    Data Review:    CBC  Recent Labs Lab 02/17/16 0130 02/18/16 0232 02/19/16 0404 02/20/16 0441  WBC 9.1 7.4 5.0 4.5  HGB 11.8* 10.5* 10.1* 10.6*  HCT 36.8 32.9* 31.1* 33.0*  PLT 236 200 155 193  MCV 94.8 94.0 94.2 93.0  MCH 30.4 30.0 30.6 29.9  MCHC 32.1 31.9 32.5 32.1  RDW 13.3 13.9 13.7 13.4  LYMPHSABS 0.4*  --   --   --   MONOABS 0.6  --   --   --   EOSABS 0.0  --   --   --   BASOSABS 0.0  --   --   --     Chemistries   Recent Labs Lab 02/17/16 0130 02/18/16 0232 02/19/16 0404  NA 134* 137 138  K 4.5 4.0 4.1  CL 102 105 106  CO2 22 23 22   GLUCOSE 156* 144* 103*  BUN 21* 14 15  CREATININE 0.99 0.87 0.81  CALCIUM 8.7* 8.3* 8.5*  MG  --   --  1.9  AST 23  --   --   ALT 17  --   --   ALKPHOS 70  --   --   BILITOT 1.5*  --   --    ------------------------------------------------------------------------------------------------------------------ No results for input(s): CHOL, HDL, LDLCALC, TRIG, CHOLHDL, LDLDIRECT in the last 72 hours.  Lab Results  Component Value Date   HGBA1C 6.2 03/24/2014   ------------------------------------------------------------------------------------------------------------------  Recent Labs  02/17/16 1902  TSH 0.293*   ------------------------------------------------------------------------------------------------------------------ No results for input(s): VITAMINB12, FOLATE, FERRITIN, TIBC, IRON, RETICCTPCT in the last 72 hours.  Coagulation profile  Recent Labs Lab 02/17/16 0130 02/17/16 1902 02/18/16 0232 02/19/16 0404 02/20/16 0441  INR 2.11* 2.47* 2.40* 2.24* 2.45*    No results for input(s): DDIMER in the last 72 hours.  Cardiac Enzymes  Recent Labs Lab 02/17/16 0130  TROPONINI 0.03   ------------------------------------------------------------------------------------------------------------------    Component Value Date/Time   BNP 198.3* 02/17/2016  0130    Inpatient Medications  Scheduled Meds: . atorvastatin  20 mg Oral q1800  . benzonatate  100 mg Oral Q8H  . carvedilol  6.25 mg Oral BID WC  . dextromethorphan  30 mg Oral BID  . furosemide  40 mg Oral Daily  . ketorolac  1 drop Right Eye BID  . [START ON 02/22/2016] levothyroxine  100 mcg Oral Daily  . oseltamivir  30 mg Oral BID  . potassium chloride SA  20 mEq Oral BID  . sodium chloride flush  3 mL Intravenous Q12H  . vitamin B-12  1,000 mcg Oral Daily  . Warfarin - Pharmacist Dosing Inpatient   Does not apply q1800   Continuous Infusions:  PRN Meds:.acetaminophen, ALPRAZolam, bisacodyl, HYDROcodone-acetaminophen, loperamide, ondansetron **OR** ondansetron (ZOFRAN) IV  Micro Results Recent Results (from the past 240 hour(s))  Blood Culture (routine x 2)     Status: None (Preliminary result)   Collection Time: 02/17/16  1:30 AM  Result Value Ref Range Status   Specimen Description BLOOD LEFT ANTECUBITAL  Final   Special Requests BOTTLES DRAWN AEROBIC AND ANAEROBIC 5ML  Final   Culture   Final    NO GROWTH 3 DAYS Performed at [facility]    Report Status PENDING  Incomplete  Blood Culture (routine x 2)  Status: None (Preliminary result)   Collection Time: 02/17/16  1:35 AM  Result Value Ref Range Status   Specimen Description BLOOD RIGHT ANTECUBITAL  Final   Special Requests BOTTLES DRAWN AEROBIC AND ANAEROBIC 5ML  Final   Culture   Final    NO GROWTH 3 DAYS Performed at [facility]    Report Status PENDING  Incomplete  Urine culture     Status: None   Collection Time: 02/17/16  2:13 AM  Result Value Ref Range Status   Specimen Description URINE, CATHETERIZED  Final   Special Requests NONE  Final   Culture   Final    NO GROWTH 1 DAY Performed at [facility]    Report Status 02/18/2016 FINAL  Final  MRSA PCR Screening     Status: None   Collection Time: 02/17/16  5:22 PM  Result Value Ref Range Status   MRSA by PCR NEGATIVE  NEGATIVE Final    Comment:        The GeneXpert MRSA Assay (FDA approved for NASAL specimens only), is one component of a comprehensive MRSA colonization surveillance program. It is not intended to diagnose MRSA infection nor to guide or monitor treatment for MRSA infections.     Radiology Reports Dg Chest 2 View  02/18/2016  CLINICAL DATA:  82 year old female with a history of CHF EXAM: CHEST - 2 VIEW COMPARISON:  Multiple prior, most recent chest x-ray 02/15/2016, CT 02/17/2016 FINDINGS: Cardiomediastinal silhouette unchanged, with cardiomegaly. Compared to the recent chest x-ray, there has been progression of bilateral, right greater than left multifocal airspace disease, in a distribution similar to the CT. No pneumothorax. Stigmata of emphysema, with increased retrosternal airspace, flattened hemidiaphragms, increased AP diameter, and hyperinflation on the AP view. No large pleural effusion. Atherosclerosis of the aortic arch. Osteopenia. No displaced fracture identified. IMPRESSION: Multifocal airspace/interstitial opacities, right greater than left, concerning for pneumonia given the appearance. If the concern is for edema, the asymmetric pattern is atypical, though could represent acute mitral valve regurgitation, and correlation with echocardiography may be useful in this scenario. Atherosclerosis. Signed, Dulcy Fanny. Earleen Newport, DO [facility] Electronically Signed   By: Corrie Mckusick D.O.   On: 02/18/2016 08:38   Dg Chest 2 View  02/15/2016  CLINICAL DATA:  Cough and congestion. Chills in shortness of breath. EXAM: CHEST  2 VIEW COMPARISON:  12/15/2015 FINDINGS: Enlargement of the cardiac silhouette is unchanged. Thoracic aortic atherosclerosis is noted. The lungs remain hyperinflated with similar appearance of chronic interstitial changes suggestive of COPD. No acute airspace consolidation, edema, pleural effusion, or pneumothorax is  identified. The no acute osseous abnormality is identified. IMPRESSION: Unchanged appearance of the chest without evidence of acute cardiopulmonary process. Electronically Signed   By: Logan Bores M.D.   On: 02/15/2016 22:23   Ct Chest Wo Contrast  02/17/2016  CLINICAL DATA:  Increasing cough, fever, chills, shortness of breath and generalize weakness for 4 days. EXAM: CT CHEST WITHOUT CONTRAST TECHNIQUE: Multidetector CT imaging of the chest was performed following the standard protocol without IV contrast. COMPARISON:  None. FINDINGS: Diffuse cardiac enlargement. Calcification in the mitral valve annulus, aortic valve, and Coronary arteries. Calcification in the aorta. No aortic dilatation. Prominent mediastinal lymph nodes with pretracheal node measuring 1.5 cm diameter. Probable prominent right hilar nodes as well. These nodes may be reactive. Esophagus is decompressed. There is diffuse patchy airspace disease in the perihilar regions with interstitial thickening suggesting edema. Changes likely to  represent pulmonary edema although bilateral pneumonia or ARDS could also have this appearance. Airways appear patent. No pleural effusions. No pneumothorax. Included portions of the upper abdominal organs suggest possible hepatic cirrhosis with enlarged lateral segment of the left lobe of liver. Degenerative changes in the spine. Anterior compression of T12. This is unchanged since previous CT abdomen and pelvis 12/15/2015. IMPRESSION: Cardiac enlargement with perihilar airspace and interstitial disease. This is likely due to edema although bilateral pneumonia or ARDS could also have this appearance. Electronically Signed   By: Lucienne Capers M.D.   On: 02/17/2016 03:15    Time Spent in minutes  35   SINGH,PRASHANT K M.D on 02/20/2016 at 10:47 AM  Between 7am to 7pm - Pager - [phone]  After 7pm go to [email or web address] - password [email or web address]  [facility] -  Office  [phone]

## 2016-02-21 ENCOUNTER — Telehealth: Payer: Self-pay | Admitting: Cardiovascular Disease

## 2016-02-21 LAB — PROTIME-INR
INR: 2.91 — ABNORMAL HIGH (ref 0.00–1.49)
Prothrombin Time: 29.9 seconds — ABNORMAL HIGH (ref 11.6–15.2)

## 2016-02-21 MED ORDER — OSELTAMIVIR PHOSPHATE 30 MG PO CAPS: 30.0000 mg | ORAL_CAPSULE | Freq: Two times a day (BID) | ORAL | Status: DC

## 2016-02-21 MED ORDER — LEVOTHYROXINE SODIUM 100 MCG PO TABS: 100.0000 ug | ORAL_TABLET | Freq: Every day | ORAL | Status: DC

## 2016-02-21 NOTE — Progress Notes (Signed)
Reviewed discharge paperwork and prescriptions with pt. Saw new walker in room, but no evidence that patient was supposed to go home with walker.  Pt refused walker.  PIV removed.  Pt taken to discharge location via wheelchair.

## 2016-02-21 NOTE — Telephone Encounter (Signed)
Called patient back. Patient stated that she saw Dr. Johnsie Cancel in the hospital, and that he was fine with her keeping her appointment in May. Verified with Dr. Johnsie Cancel that patient does not have to follow-up until May. Patient will be seeing her PCP on Friday of this week.

## 2016-02-21 NOTE — Telephone Encounter (Signed)
New Message  Pt was just seen @ [facility]- did not want to sched appt for E[facility]- requested to speak w/ rN. Please call back and discuss.

## 2016-02-21 NOTE — Discharge Summary (Signed)
[name], is a 82 y.o. female  DOB January 28, 1929  MRN [id number].  Admission date:  02/17/2016  Admitting Physician  Etta Quill, DO  Discharge Date:  02/21/2016   Primary MD  Laurey Morale, MD  Recommendations for primary care physician for things to follow:   CBC, BMP in 3-4 days. Check TSH, free T4 and T3 in 3-4 weeks.   Admission Diagnosis  Respiratory distress [R06.00] Bilateral pneumonia [J18.9]   Discharge Diagnosis  Respiratory distress [R06.00] Bilateral pneumonia [J18.9]     Principal Problem:   Acute respiratory failure with hypercapnia (HCC) Active Problems:   Hyperlipemia   Essential hypertension   ATRIAL FIBRILLATION   Aortic stenosis   Bilateral pneumonia   Acute diastolic heart failure ([facility])      Past Medical History  Diagnosis Date  . Hypertension   . Chest pain, unspecified   . Atrial fibrillation ([place])   . Mitral valve disorder   . Patent foramen ovale   . Peripheral vascular disease ([place])   . Venous insufficiency   . Hyperlipidemia   . Impaired glucose tolerance   . Hypothyroid   . Diverticulosis of colon   . History of colonic polyps   . History of colon cancer   . Splenic infarction   . History of uterine cancer   . UTI (lower urinary tract infection)   . Osteoarthritis   . Chronic low back pain   . Osteoporosis   . Peripheral neuropathy ([place])   . Anxiety   . Vitamin B12 deficiency   . History of colonoscopy   . Cataract     REMOVED BILATERAL    Past Surgical History  Procedure Laterality Date  . Hysterectomy for uterine cancer    . Anterior and posterior vaginal repair  03/2006    Dr.Neal  . Basal cell skin cancer moh's surgery    . Right hemicolectomy for colon cancer  1994  . Lumbar laminectomy for spinal stenosis  2006    L3-4  . Left carpal  tunnel surgery  02/2007    Dr. Daylene Katayama  . Retinal detachment repair w/ scleral buckle le Right 03-01-14    per Dr. Zadie Rhine   . Colonoscopy  05-25-10    per Dr. Henrene Pastor, benign polyps, repeat in 5 yrs   . Eye surgery    . Cataract extraction, bilateral         HPI  from the history and physical done on the day of admission:   [name] is a 82 y.o. female, History of chronic atrial fibrillation on Coumadin, osteoarthritis, essential hypertension, dyslipidemia, diverticulosis, mild aortic stenosis who was in her usual state of health, lives at home by herself, initially presented to [facility] 4 days ago with chief complaints of a dry hacking cough and low-grade fever, after initial workup she was discharged home with cough medication, she continued to get gradually worse, started to develop low-grade fevers, shortness of breath and cough got worse, resented to [facility] again this morning.  In the ER she was diagnosed with acute hypoxic respiratory failure, she had low-grade fever, blood work was unremarkable, lactic acid was stable, chest x-ray and CT scan was suggestive of bilateral interstitial pattern suggestive of atypical infection versus ARDS, patient was accepted to stepdown unit by one of my partners this morning presented several hours later due to bed crunch.  Currently besides dry cough and mild shortness of breath patient has no subjective complaints, she is noted to be mildly hypotensive, she has A. fib with RVR, does not appear toxic. Denies any recent travel, no chest pain, no exposure to sick contacts, no abdominal pain, no diarrhea or dysuria, no blood in stool or urine, no focal weakness.      Hospital Course:     1. Acute hypoxic respiratory failure. Due to Flu CAP. Was Treated with Tamiflu, oxygen and supportive care with good effect, now symptom-free off oxygen feeling back to baseline, needs 2 more doses of Tamiflu upon discharge.  2. Chronic  atrial fibrillation. [place] vasc 2 score of at least 3. She went into RVR was seen by cardiology, initially she was hypotensive and required amiodarone/digoxin, now blood pressure better and rate is well controlled on Coreg which will be continued. Pharmacy monitoring Coumadin INR therapeutic.  3. Essential hypertension. Initially dehydrated and hypotensive and required fluids, now resolved..  4. Dyslipidemia. Continue home dose statin.  5. Hypothyroidism. TSH was suppressed Synthroid dose has been dropped  To 100 g daily, request PCP to check TSH, free T4 and T3 in 3-4 weeks.  6. Moderate to severe mitral stenosis and moderate AS - stable, will follow with cardiology outpatient post discharge. Echo from 2015 noted. Cardiology saw the patient here no further workup in the inpatient setting.  7. Chronic diastolic CHF. Last EF 60% in November 2015. Clinically to me she appears Compensated at this time. Cardiology following, pulmonary to evaluated as well. Placed on 40mg  PO lasix by cards which is her home dose will be continued upon discharge, follow.  8. Osteoarthritis. Supportive care  9. L Foot cast - for charcot foot repair, follow with Dr Doran Durand in 1-2 weeks.     Discharge Condition: Stable  Follow UP  Follow-up Information    Follow up with FRY,STEPHEN A, MD. Schedule an appointment as soon as possible for a visit in 3 days.   Specialty:  Family Medicine   Contact information:   [place] [address] [phone]       Follow up with Candee Furbish, MD. Schedule an appointment as soon as possible for a visit in 1 week.   Specialty:  Cardiology   Contact information:   [id number] [place] [address] [phone]        [place], Pulmonary  Diet and Activity recommendation: See Discharge Instructions below  Discharge Instructions       Discharge Instructions    Diet - low sodium heart healthy    Complete by:   As directed      Discharge instructions    Complete by:  As directed   Follow with Primary MD FRY,STEPHEN A, MD in 3 days   Get CBC, CMP, INR, 2 view Chest X ray checked  by Primary MD next visit.    Activity: As tolerated with Full fall precautions use walker/cane & assistance as needed   Disposition Home    Diet:   Heart Healthy .  For  Heart failure patients - Check your Weight same time everyday, if you gain over 2 pounds, or you develop in leg swelling, experience more shortness of breath or chest pain, call your Primary MD immediately. Follow Cardiac Low Salt Diet and 1.5 lit/day fluid restriction.   On your next visit with your primary care physician please Get Medicines reviewed and adjusted.   Please request your Prim.MD to go over all Hospital Tests and Procedure/Radiological results at the follow up, please get all Hospital records sent to your Prim MD by signing hospital release before you go home.   If you experience worsening of your admission symptoms, develop shortness of breath, life threatening emergency, suicidal or homicidal thoughts you must seek medical attention immediately by calling 911 or calling your MD immediately  if symptoms less severe.  You Must read complete instructions/literature along with all the possible adverse reactions/side effects for all the Medicines you take and that have been prescribed to you. Take any new Medicines after you have completely understood and accpet all the possible adverse reactions/side effects.   Do not drive, operating heavy machinery, perform activities at heights, swimming or participation in water activities or provide baby sitting services if your were admitted for syncope or siezures until you have seen by Primary MD or a Neurologist and advised to do so again.  Do not drive when taking Pain medications.    Do not take more than prescribed Pain, Sleep and Anxiety Medications  Special Instructions: If you have  smoked or chewed Tobacco  in the last 2 yrs please stop smoking, stop any regular Alcohol  and or any Recreational drug use.  Wear Seat belts while driving.   Please note  You were cared for by a hospitalist during your hospital stay. If you have any questions about your discharge medications or the care you received while you were in the hospital after you are discharged, you can call the unit and asked to speak with the hospitalist on call if the hospitalist that took care of you is not available. Once you are discharged, your primary care physician will handle any further medical issues. Please note that NO REFILLS for any discharge medications will be authorized once you are discharged, as it is imperative that you return to your primary care physician (or establish a relationship with a primary care physician if you do not have one) for your aftercare needs so that they can reassess your need for medications and monitor your lab values.     Increase activity slowly    Complete by:  As directed              Discharge Medications       Medication List    TAKE these medications        ALPRAZolam 0.25 MG tablet  Commonly known as:  XANAX  Take 1 tablet (0.25 mg total) by mouth at bedtime as needed for sleep.     atorvastatin 20 MG tablet  Commonly known as:  LIPITOR  Take 20 mg by mouth daily.     benzonatate 100 MG capsule  Commonly known as:  TESSALON  Take 1 capsule (100 mg total) by mouth every 8 (eight) hours.     CALTRATE 600+D 600-400 MG-UNIT tablet  Generic drug:  Calcium Carbonate-Vitamin D  Take 1 tablet by mouth daily.     carvedilol 6.25 MG tablet  Commonly known as:  COREG  TAKE 1 TABLET TWICE A DAY  CENTRUM SILVER PO  Take 1 tablet by mouth daily.     estradiol 0.1 mg/24hr patch  Commonly known as:  CLIMARA - Dosed in mg/24 hr  Place 1 patch onto the skin once a week. mondaya     furosemide 40 MG tablet  Commonly known as:  LASIX  Take 1 tablet  (40 mg total) by mouth daily.     gabapentin 300 MG capsule  Commonly known as:  NEURONTIN  Take 1 capsule (300 mg total) by mouth 2 (two) times daily.     ketorolac 0.5 % ophthalmic solution  Commonly known as:  ACULAR  Place 1 drop into the right eye 2 (two) times daily.     levothyroxine 100 MCG tablet  Commonly known as:  SYNTHROID, LEVOTHROID  Take 1 tablet (100 mcg total) by mouth daily.  Start taking on:  02/22/2016     loperamide 2 MG tablet  Commonly known as:  IMODIUM A-D  Take 2 mg by mouth 4 (four) times daily as needed for diarrhea or loose stools.     meclizine 25 MG tablet  Commonly known as:  ANTIVERT  TAKE 1/2 OR 1 TABLET BY MOUTH EVERY 6 HOURS AS NEEDED FOR DIZZINESS     oseltamivir 30 MG capsule  Commonly known as:  TAMIFLU  Take 1 capsule (30 mg total) by mouth 2 (two) times daily. 2 pills only     potassium chloride SA 20 MEQ tablet  Commonly known as:  K-DUR,KLOR-CON  Take 1 tablet (20 mEq total) by mouth 2 (two) times daily.     quinapril 20 MG tablet  Commonly known as:  ACCUPRIL  Take 1 tablet (20 mg total) by mouth daily.     spironolactone 25 MG tablet  Commonly known as:  ALDACTONE  Take 1 tablet (25 mg total) by mouth daily.     vitamin B-12 1000 MCG tablet  Commonly known as:  CYANOCOBALAMIN  Take 1,000 mcg by mouth daily.     warfarin 5 MG tablet  Commonly known as:  COUMADIN  TAKE AS DIRECTED BY ANTICOAGULATION CLINIC        Major procedures and Radiology Reports - PLEASE review detailed and final reports for all details, in brief -    CT chest done upon admission. Diffuse interstitial pattern suspicious for ARDS/fluid overload/pneumonia.   Dg Chest 2 View  02/18/2016  CLINICAL DATA:  82 year old female with a history of CHF EXAM: CHEST - 2 VIEW COMPARISON:  Multiple prior, most recent chest x-ray 02/15/2016, CT 02/17/2016 FINDINGS: Cardiomediastinal silhouette unchanged, with cardiomegaly. Compared to the recent chest x-ray,  there has been progression of bilateral, right greater than left multifocal airspace disease, in a distribution similar to the CT. No pneumothorax. Stigmata of emphysema, with increased retrosternal airspace, flattened hemidiaphragms, increased AP diameter, and hyperinflation on the AP view. No large pleural effusion. Atherosclerosis of the aortic arch. Osteopenia. No displaced fracture identified. IMPRESSION: Multifocal airspace/interstitial opacities, right greater than left, concerning for pneumonia given the appearance. If the concern is for edema, the asymmetric pattern is atypical, though could represent acute mitral valve regurgitation, and correlation with echocardiography may be useful in this scenario. Atherosclerosis. Signed, Dulcy Fanny. Earleen Newport, DO [facility] Electronically Signed   By: Corrie Mckusick D.O.   On: 02/18/2016 08:38   Dg Chest 2 View  02/15/2016  CLINICAL DATA:  Cough and congestion. Chills in shortness of breath. EXAM: CHEST  2 VIEW COMPARISON:  12/15/2015  FINDINGS: Enlargement of the cardiac silhouette is unchanged. Thoracic aortic atherosclerosis is noted. The lungs remain hyperinflated with similar appearance of chronic interstitial changes suggestive of COPD. No acute airspace consolidation, edema, pleural effusion, or pneumothorax is identified. The no acute osseous abnormality is identified. IMPRESSION: Unchanged appearance of the chest without evidence of acute cardiopulmonary process. Electronically Signed   By: Logan Bores M.D.   On: 02/15/2016 22:23   Ct Chest Wo Contrast  02/17/2016  CLINICAL DATA:  Increasing cough, fever, chills, shortness of breath and generalize weakness for 4 days. EXAM: CT CHEST WITHOUT CONTRAST TECHNIQUE: Multidetector CT imaging of the chest was performed following the standard protocol without IV contrast. COMPARISON:  None. FINDINGS: Diffuse cardiac enlargement. Calcification in the mitral  valve annulus, aortic valve, and Coronary arteries. Calcification in the aorta. No aortic dilatation. Prominent mediastinal lymph nodes with pretracheal node measuring 1.5 cm diameter. Probable prominent right hilar nodes as well. These nodes may be reactive. Esophagus is decompressed. There is diffuse patchy airspace disease in the perihilar regions with interstitial thickening suggesting edema. Changes likely to represent pulmonary edema although bilateral pneumonia or ARDS could also have this appearance. Airways appear patent. No pleural effusions. No pneumothorax. Included portions of the upper abdominal organs suggest possible hepatic cirrhosis with enlarged lateral segment of the left lobe of liver. Degenerative changes in the spine. Anterior compression of T12. This is unchanged since previous CT abdomen and pelvis 12/15/2015. IMPRESSION: Cardiac enlargement with perihilar airspace and interstitial disease. This is likely due to edema although bilateral pneumonia or ARDS could also have this appearance. Electronically Signed   By: Lucienne Capers M.D.   On: 02/17/2016 03:15    Micro Results      Recent Results (from the past 240 hour(s))  Blood Culture (routine x 2)     Status: None (Preliminary result)   Collection Time: 02/17/16  1:30 AM  Result Value Ref Range Status   Specimen Description BLOOD LEFT ANTECUBITAL  Final   Special Requests BOTTLES DRAWN AEROBIC AND ANAEROBIC 5ML  Final   Culture   Final    NO GROWTH 3 DAYS Performed at [facility]    Report Status PENDING  Incomplete  Blood Culture (routine x 2)     Status: None (Preliminary result)   Collection Time: 02/17/16  1:35 AM  Result Value Ref Range Status   Specimen Description BLOOD RIGHT ANTECUBITAL  Final   Special Requests BOTTLES DRAWN AEROBIC AND ANAEROBIC 5ML  Final   Culture   Final    NO GROWTH 3 DAYS Performed at [facility]    Report Status PENDING  Incomplete  Urine culture     Status: None    Collection Time: 02/17/16  2:13 AM  Result Value Ref Range Status   Specimen Description URINE, CATHETERIZED  Final   Special Requests NONE  Final   Culture   Final    NO GROWTH 1 DAY Performed at [facility]    Report Status 02/18/2016 FINAL  Final  MRSA PCR Screening     Status: None   Collection Time: 02/17/16  5:22 PM  Result Value Ref Range Status   MRSA by PCR NEGATIVE NEGATIVE Final    Comment:        The GeneXpert MRSA Assay (FDA approved for NASAL specimens only), is one component of a comprehensive MRSA colonization surveillance program. It is not intended to diagnose MRSA infection nor to guide or monitor treatment for MRSA infections.  Today   Subjective    [name] today has no headache,no chest abdominal pain,no new weakness tingling or numbness, feels much better wants to go home today.     Objective   Blood pressure 128/73, pulse 63, temperature 98.3 F (36.8 C), temperature source Oral, resp. rate 18, height 5\' 11"  (1.803 m), weight 86.1 kg (189 lb 13.1 oz), SpO2 95 %.   Intake/Output Summary (Last 24 hours) at 02/21/16 0914 Last data filed at 02/20/16 1018  Gross per 24 hour  Intake      0 ml  Output    200 ml  Net   -200 ml    Exam Awake Alert, Oriented x 3, No new F.N deficits, Normal affect [place].AT,PERRAL Supple Neck,No JVD, No cervical lymphadenopathy appriciated.  Symmetrical Chest wall movement, Good air movement bilaterally, CTAB RRR,No Gallops,Rubs or new Murmurs, No Parasternal Heave +ve B.Sounds, Abd Soft, Non tender, No organomegaly appriciated, No rebound -guarding or rigidity. No Cyanosis, Clubbing or edema, No new Rash or bruise   Data Review   CBC w Diff: Lab Results  Component Value Date   WBC 4.5 02/20/2016   HGB 10.6* 02/20/2016   HCT 33.0* 02/20/2016   PLT 193 02/20/2016   LYMPHOPCT 4 02/17/2016   MONOPCT 7 02/17/2016   EOSPCT 0 02/17/2016   BASOPCT 0 02/17/2016    CMP: Lab Results  Component  Value Date   NA 138 02/19/2016   K 4.1 02/19/2016   CL 106 02/19/2016   CO2 22 02/19/2016   BUN 15 02/19/2016   CREATININE 0.81 02/19/2016   PROT 6.9 02/17/2016   ALBUMIN 4.0 02/17/2016   BILITOT 1.5* 02/17/2016   ALKPHOS 70 02/17/2016   AST 23 02/17/2016   ALT 17 02/17/2016  .  Lab Results  Component Value Date   TSH 0.293* 02/17/2016   Lab Results  Component Value Date   INR 2.91* 02/21/2016   INR 2.45* 02/20/2016   INR 2.24* 02/19/2016   PROTIME 18.1 04/22/2009    Total Time in preparing paper work, data evaluation and todays exam - 35 minutes  Thurnell Lose M.D on 02/21/2016 at 9:14 AM  [facility]   Office  [phone]

## 2016-02-22 ENCOUNTER — Telehealth: Payer: Self-pay | Admitting: *Deleted

## 2016-02-22 ENCOUNTER — Ambulatory Visit: Payer: Medicare Other | Admitting: Family Medicine

## 2016-02-22 ENCOUNTER — Telehealth: Payer: Self-pay | Admitting: Cardiovascular Disease

## 2016-02-22 LAB — CULTURE, BLOOD (ROUTINE X 2)
CULTURE: NO GROWTH
Culture: NO GROWTH

## 2016-02-22 NOTE — Telephone Encounter (Signed)
New message  Pt called req a call back to discuss when she should come back for an Visit given her recent INR readings. Pt req a call back to discuss.

## 2016-02-22 NOTE — Telephone Encounter (Signed)
Pt was in hosp from 3/24 to 3/28 for Dx PNA. Discharged without ABX or steroids. INR at D/C was 2.91. Pt instructed to keep regular appt next week.

## 2016-02-22 NOTE — Telephone Encounter (Signed)
Transition Care Management Follow-up Telephone Call  How have you been since you were released from the hospital? Feel better   Do you understand why you were in the hospital? yes   Do you understand the discharge instrcutions? yes  Items Reviewed:  Medications reviewed: yes  Allergies reviewed: yes  Dietary changes reviewed: yes  Referrals reviewed: yes   Functional Questionnaire:   Activities of Daily Living (ADLs):   She states they are independent in the following: ambulation, bathing and hygiene, feeding, continence, grooming, toileting and dressing. Cast on left foot States they require assistance with the following: none   Any transportation issues/concerns?: no   Any patient concerns? no   Confirmed importance and date/time of follow-up visits scheduled: yes   Confirmed with patient if condition begins to worsen call PCP or go to the ER.  Patient was given the Call-a-Nurse line [phone]: yes Patient was discharged 02/21/16 Patient was discharged to her home Patient has an appointment with Dr Sarajane Jews 02/24/16

## 2016-02-24 ENCOUNTER — Ambulatory Visit (INDEPENDENT_AMBULATORY_CARE_PROVIDER_SITE_OTHER): Payer: Medicare Other | Admitting: Family Medicine

## 2016-02-24 ENCOUNTER — Encounter: Payer: Self-pay | Admitting: Family Medicine

## 2016-02-24 VITALS — BP 122/79 | HR 73 | Temp 98.5°F | Ht 71.0 in | Wt 182.0 lb

## 2016-02-24 DIAGNOSIS — E038 Other specified hypothyroidism: Secondary | ICD-10-CM

## 2016-02-24 DIAGNOSIS — B001 Herpesviral vesicular dermatitis: Secondary | ICD-10-CM | POA: Diagnosis not present

## 2016-02-24 DIAGNOSIS — A419 Sepsis, unspecified organism: Secondary | ICD-10-CM | POA: Diagnosis not present

## 2016-02-24 DIAGNOSIS — J189 Pneumonia, unspecified organism: Secondary | ICD-10-CM | POA: Insufficient documentation

## 2016-02-24 DIAGNOSIS — I10 Essential (primary) hypertension: Secondary | ICD-10-CM

## 2016-02-24 DIAGNOSIS — R309 Painful micturition, unspecified: Secondary | ICD-10-CM

## 2016-02-24 LAB — POC URINALSYSI DIPSTICK (AUTOMATED)
BILIRUBIN UA: NEGATIVE
Blood, UA: NEGATIVE
GLUCOSE UA: NEGATIVE
Ketones, UA: NEGATIVE
Leukocytes, UA: NEGATIVE
Nitrite, UA: NEGATIVE
PH UA: 7
Protein, UA: NEGATIVE
SPEC GRAV UA: 1.02
Urobilinogen, UA: 0.2

## 2016-02-24 LAB — CBC WITH DIFFERENTIAL/PLATELET
BASOS ABS: 0.1 10*3/uL (ref 0.0–0.1)
Basophils Relative: 0.5 % (ref 0.0–3.0)
EOS ABS: 0.3 10*3/uL (ref 0.0–0.7)
Eosinophils Relative: 2.3 % (ref 0.0–5.0)
HCT: 37.8 % (ref 36.0–46.0)
Hemoglobin: 12.3 g/dL (ref 12.0–15.0)
LYMPHS ABS: 1.9 10*3/uL (ref 0.7–4.0)
Lymphocytes Relative: 16.5 % (ref 12.0–46.0)
MCHC: 32.6 g/dL (ref 30.0–36.0)
MCV: 91.3 fl (ref 78.0–100.0)
MONO ABS: 1.1 10*3/uL — AB (ref 0.1–1.0)
Monocytes Relative: 9.6 % (ref 3.0–12.0)
NEUTROS PCT: 71.1 % (ref 43.0–77.0)
Neutro Abs: 8.1 10*3/uL — ABNORMAL HIGH (ref 1.4–7.7)
Platelets: 465 10*3/uL — ABNORMAL HIGH (ref 150.0–400.0)
RBC: 4.14 Mil/uL (ref 3.87–5.11)
RDW: 14.4 % (ref 11.5–15.5)
WBC: 11.4 10*3/uL — AB (ref 4.0–10.5)

## 2016-02-24 LAB — BASIC METABOLIC PANEL
BUN: 18 mg/dL (ref 6–23)
CALCIUM: 9.9 mg/dL (ref 8.4–10.5)
CO2: 31 mEq/L (ref 19–32)
CREATININE: 0.84 mg/dL (ref 0.40–1.20)
Chloride: 98 mEq/L (ref 96–112)
GFR: 68.5 mL/min (ref >60.00)
GLUCOSE: 83 mg/dL (ref 70–99)
Potassium: 5 mEq/L (ref 3.5–5.1)
Sodium: 138 mEq/L (ref 135–145)

## 2016-02-24 LAB — T4, FREE: FREE T4: 0.92 ng/dL (ref 0.60–1.60)

## 2016-02-24 LAB — T3, FREE: T3 FREE: 2.9 pg/mL (ref 2.3–4.2)

## 2016-02-24 LAB — TSH: TSH: 12.72 u[IU]/mL — AB (ref 0.35–4.50)

## 2016-02-24 MED ORDER — VALACYCLOVIR HCL 500 MG PO TABS: 500.0000 mg | ORAL_TABLET | Freq: Two times a day (BID) | ORAL | Status: DC

## 2016-02-24 NOTE — Progress Notes (Signed)
   Subjective:    Patient ID: [name], female    DOB: 09-26-1929, 82 y.o.   MRN: [id number]  HPI Here to follow up a hospital stay from 02-17-16 to 02-21-16 for bilateral pneumonia with sepsis and acute respiratory failure. This probably was secondary to an influenza infection. She was treated wit oxygen and IV antibiotics. No causative agent was identified. She was seen by Cardiology as well. She recovered fairly well and was able to go home. Since her DC she has felt very fatigued and is mildly SOB. No chest pain almost no coughing. No fevers. She has not had much of an appetite. She also developed a fever blister on the lower lip a few days ago.    Review of Systems  Constitutional: Positive for fatigue. Negative for fever, chills and diaphoresis.  Respiratory: Positive for shortness of breath. Negative for cough and chest tightness.   Genitourinary: Negative.   Skin: Positive for rash.  Neurological: Negative.        Objective:   Physical Exam  Constitutional: She is oriented to person, place, and time. She appears well-developed and well-nourished. No distress.  Wearing a cast on the left foot  Neck: No thyromegaly present.  Cardiovascular: Normal rate, regular rhythm, normal heart sounds and intact distal pulses.   Pulmonary/Chest: Effort normal and breath sounds normal. No respiratory distress. She has no wheezes. She has no rales.  Lymphadenopathy:    She has no cervical adenopathy.  Neurological: She is alert and oriented to person, place, and time.          Assessment & Plan:  The pneumonia has resolved but she is still getting her energy back. I suggested she take 3-4 bottles of Ensure a day. Check labs today. Try Valtrex for the fever blisters. She is scheduled to  see Dr. Johnsie Cancel on 04-02-16.  Laurey Morale, MD

## 2016-02-24 NOTE — Progress Notes (Signed)
Pre visit review using our clinic review tool, if applicable. No additional management support is needed unless otherwise documented below in the visit note. 

## 2016-02-27 ENCOUNTER — Telehealth: Payer: Self-pay | Admitting: Family Medicine

## 2016-02-27 NOTE — Telephone Encounter (Signed)
Refill request for L-thyroxine ( Synthroid ) 100 mcg and send to [facility].

## 2016-02-28 NOTE — Telephone Encounter (Signed)
Can we refill this? 

## 2016-02-28 NOTE — Telephone Encounter (Signed)
Refill for one year 

## 2016-02-29 ENCOUNTER — Ambulatory Visit (INDEPENDENT_AMBULATORY_CARE_PROVIDER_SITE_OTHER): Payer: Medicare Other | Admitting: Pharmacist

## 2016-02-29 DIAGNOSIS — Z7901 Long term (current) use of anticoagulants: Secondary | ICD-10-CM

## 2016-02-29 DIAGNOSIS — I4891 Unspecified atrial fibrillation: Secondary | ICD-10-CM

## 2016-02-29 LAB — POCT INR: INR: 2.6

## 2016-02-29 MED ORDER — LEVOTHYROXINE SODIUM 100 MCG PO TABS: 100.0000 ug | ORAL_TABLET | Freq: Every day | ORAL | Status: DC

## 2016-02-29 NOTE — Telephone Encounter (Signed)
I sent script e-scribe. 

## 2016-03-01 DIAGNOSIS — M14672 Charcot's joint, left ankle and foot: Secondary | ICD-10-CM | POA: Diagnosis not present

## 2016-03-02 ENCOUNTER — Telehealth: Payer: Self-pay | Admitting: Cardiovascular Disease

## 2016-03-02 NOTE — Telephone Encounter (Signed)
New message   Pt calling to speak to the rn  She would not tell me why

## 2016-03-02 NOTE — Telephone Encounter (Signed)
Called patient back. Patient complaining of SOB that has been going on since being sick with the FLU and PNA. Patient has seen her PCP Dr. Sarajane Jews, but wanted to know if she should come in sooner than 04/02/16 for her appointment with Dr. Johnsie Cancel. Patient wants to see how she feels after the weekend. Encouraged patient to call if her symptoms get worse or go to ED. Patient verbalized understanding.

## 2016-03-16 DIAGNOSIS — M14672 Charcot's joint, left ankle and foot: Secondary | ICD-10-CM | POA: Diagnosis not present

## 2016-03-21 ENCOUNTER — Ambulatory Visit (INDEPENDENT_AMBULATORY_CARE_PROVIDER_SITE_OTHER): Payer: Medicare Other | Admitting: Surgery

## 2016-03-21 DIAGNOSIS — Z7901 Long term (current) use of anticoagulants: Secondary | ICD-10-CM

## 2016-03-21 DIAGNOSIS — I4891 Unspecified atrial fibrillation: Secondary | ICD-10-CM | POA: Diagnosis not present

## 2016-03-21 LAB — POCT INR: INR: 2.4

## 2016-04-01 NOTE — Progress Notes (Signed)
Patient ID: [name], female   DOB: 06-07-1929, 82 y.o.   MRN: [id number]  [name] is seen today for f/U of afib, HTN, and edema. Her coumadin has been Rx. No palpitations, SOB or SSCP. She is walking on a regular basis without symptoms. She retired from [employer] 2 years ago . Her husband [name] died and she is now living in apartment near [place] has worked well as a diuretic for edema as she requires much less K. She had a splenic infarct when enrolled in the engage trial and we have not thought about changing her to Pradaxa    Started back doing water aerobics at the [facility]   I care for her sister [name] who has had issues with edema and memory   Mild AS  Echo reviewed from 09/2014  Mean gradient 11 mmHG    Has had UTI and flu requiring 2 hospitalizations this winter Charcot foot LLE seeing [facility] hard cast off in walking boot now    ROS: Denies fever, malais, weight loss, blurry vision, decreased visual acuity, cough, sputum, SOB, hemoptysis, pleuritic pain, palpitaitons, heartburn, abdominal pain, melena, lower extremity edema, claudication, or rash.  All other systems reviewed and negative  General: Affect appropriate Healthy:  appears stated age 20: normal Neck supple with no adenopathy JVP normal no bruits no thyromegaly Lungs clear with no wheezing and good diaphragmatic motion Heart:  S1/S2 AS  murmur, no rub, gallop or click PMI normal Abdomen: benighn, BS positve, no tenderness, no AAA no bruit.  No HSM or HJR Distal pulses intact with no bruits No edema left foot in walking boot Left foot in boot with healing ulcer  Skin warm and dry No muscular weakness   Current Outpatient Prescriptions  Medication Sig Dispense Refill  . ALPRAZolam (XANAX) 0.25 MG tablet Take 1 tablet (0.25 mg total) by mouth at bedtime as needed for sleep. 30 tablet 5  . atorvastatin (LIPITOR) 20 MG tablet Take 20 mg by mouth daily.    . Calcium Carbonate-Vitamin D (CALTRATE  600+D) 600-400 MG-UNIT per tablet Take 1 tablet by mouth daily.      . carvedilol (COREG) 6.25 MG tablet Take 6.25 mg by mouth daily.    Marland Kitchen estradiol (CLIMARA - DOSED IN MG/24 HR) 0.1 mg/24hr Place 1 patch onto the skin once a week. mondaya    . furosemide (LASIX) 40 MG tablet Take 1 tablet (40 mg total) by mouth daily. 90 tablet 1  . ketorolac (ACULAR) 0.5 % ophthalmic solution Place 1 drop into the right eye 2 (two) times daily.    Marland Kitchen levothyroxine (SYNTHROID, LEVOTHROID) 100 MCG tablet Take 1 tablet (100 mcg total) by mouth daily. 90 tablet 3  . loperamide (IMODIUM A-D) 2 MG tablet Take 2 mg by mouth 4 (four) times daily as needed for diarrhea or loose stools.    . meclizine (ANTIVERT) 25 MG tablet TAKE 1/2 OR 1 TABLET BY MOUTH EVERY 6 HOURS AS NEEDED FOR DIZZINESS 30 tablet 5  . Multiple Vitamins-Minerals (CENTRUM SILVER PO) Take 1 tablet by mouth daily.      . potassium chloride SA (K-DUR,KLOR-CON) 20 MEQ tablet Take 1 tablet (20 mEq total) by mouth 2 (two) times daily. 180 tablet 3  . quinapril (ACCUPRIL) 20 MG tablet Take 1 tablet (20 mg total) by mouth daily. 90 tablet 3  . spironolactone (ALDACTONE) 25 MG tablet Take 1 tablet (25 mg total) by mouth daily. 90 tablet 1  . valACYclovir (VALTREX)  500 MG tablet Take 1 tablet (500 mg total) by mouth 2 (two) times daily. 60 tablet 5  . vitamin B-12 (CYANOCOBALAMIN) 1000 MCG tablet Take 1,000 mcg by mouth daily.      Marland Kitchen warfarin (COUMADIN) 5 MG tablet TAKE AS DIRECTED BY ANTICOAGULATION CLINIC (Patient taking differently: TAKES 7.5MG  ON SUN, TAKES 5MG  ALL OTHER DAYS) 120 tablet 0   No current facility-administered medications for this visit.    Allergies  Codeine  Electrocardiogram:  4/14  afib rate 64  Nonspecific ST/T wave changes  Today afib rate 63 nonspecific St changes similar to 2014  10/04/15  afib rate 62 nonspecific ST changes   Assessment and Plan Afib:  Chronic good rate control INR Rx no bleeding issues stable  Neuropathy:  F/u  Regal ulcer healed done with course of antibiotics HTN:  Well controlled.  Continue current medications and low sodium Dash type diet.   Chol:  On statin  Cholesterol is at goal.  Continue current dose of statin and diet Rx.  No myalgias or side effects.  F/U  LFT's in 6 months. Lab Results  Component Value Date   LDLCALC 69 03/11/2015   AS: mild on echo 10/05/14  Mean gradient 11 mmHg  F/u echo   Charcot Foot: f/u [facility] continue soft boot has orthotic implants for shoes        F/U with me in 6 months   Jenkins Rouge

## 2016-04-02 ENCOUNTER — Encounter: Payer: Self-pay | Admitting: Cardiovascular Disease

## 2016-04-02 ENCOUNTER — Ambulatory Visit (INDEPENDENT_AMBULATORY_CARE_PROVIDER_SITE_OTHER): Payer: Medicare Other | Admitting: Cardiovascular Disease

## 2016-04-02 VITALS — BP 120/68 | HR 75 | Ht 71.0 in | Wt 178.1 lb

## 2016-04-02 DIAGNOSIS — I350 Nonrheumatic aortic (valve) stenosis: Secondary | ICD-10-CM | POA: Diagnosis not present

## 2016-04-02 NOTE — Patient Instructions (Addendum)

## 2016-04-06 ENCOUNTER — Other Ambulatory Visit: Payer: Self-pay | Admitting: Family Medicine

## 2016-04-08 ENCOUNTER — Other Ambulatory Visit: Payer: Self-pay | Admitting: Cardiovascular Disease

## 2016-04-09 NOTE — Telephone Encounter (Signed)
Rx request sent to pharmacy.  

## 2016-04-13 DIAGNOSIS — M14672 Charcot's joint, left ankle and foot: Secondary | ICD-10-CM | POA: Diagnosis not present

## 2016-04-17 ENCOUNTER — Ambulatory Visit (INDEPENDENT_AMBULATORY_CARE_PROVIDER_SITE_OTHER): Payer: Medicare Other | Admitting: *Deleted

## 2016-04-17 ENCOUNTER — Ambulatory Visit (HOSPITAL_COMMUNITY): Payer: Medicare Other | Attending: Cardiology

## 2016-04-17 ENCOUNTER — Other Ambulatory Visit: Payer: Self-pay

## 2016-04-17 DIAGNOSIS — I4891 Unspecified atrial fibrillation: Secondary | ICD-10-CM

## 2016-04-17 DIAGNOSIS — I350 Nonrheumatic aortic (valve) stenosis: Secondary | ICD-10-CM | POA: Insufficient documentation

## 2016-04-17 DIAGNOSIS — Z7901 Long term (current) use of anticoagulants: Secondary | ICD-10-CM

## 2016-04-17 LAB — POCT INR: INR: 3.1

## 2016-04-26 DIAGNOSIS — M14672 Charcot's joint, left ankle and foot: Secondary | ICD-10-CM | POA: Diagnosis not present

## 2016-04-30 DIAGNOSIS — M14672 Charcot's joint, left ankle and foot: Secondary | ICD-10-CM | POA: Diagnosis not present

## 2016-05-02 DIAGNOSIS — M14672 Charcot's joint, left ankle and foot: Secondary | ICD-10-CM | POA: Diagnosis not present

## 2016-05-07 DIAGNOSIS — M14672 Charcot's joint, left ankle and foot: Secondary | ICD-10-CM | POA: Diagnosis not present

## 2016-05-08 ENCOUNTER — Ambulatory Visit (INDEPENDENT_AMBULATORY_CARE_PROVIDER_SITE_OTHER): Payer: Medicare Other | Admitting: *Deleted

## 2016-05-08 DIAGNOSIS — I4891 Unspecified atrial fibrillation: Secondary | ICD-10-CM | POA: Diagnosis not present

## 2016-05-08 DIAGNOSIS — Z7901 Long term (current) use of anticoagulants: Secondary | ICD-10-CM | POA: Diagnosis not present

## 2016-05-08 LAB — POCT INR: INR: 2.6

## 2016-05-09 DIAGNOSIS — M14672 Charcot's joint, left ankle and foot: Secondary | ICD-10-CM | POA: Diagnosis not present

## 2016-05-15 DIAGNOSIS — M14672 Charcot's joint, left ankle and foot: Secondary | ICD-10-CM | POA: Diagnosis not present

## 2016-05-17 DIAGNOSIS — M14672 Charcot's joint, left ankle and foot: Secondary | ICD-10-CM | POA: Diagnosis not present

## 2016-05-19 ENCOUNTER — Encounter: Payer: Self-pay | Admitting: Internal Medicine

## 2016-05-19 ENCOUNTER — Other Ambulatory Visit (HOSPITAL_COMMUNITY)
Admission: RE | Admit: 2016-05-19 | Discharge: 2016-05-19 | Disposition: A | Discharge status: Home / Self Care | Payer: Medicare Other | Source: Other Acute Inpatient Hospital | Attending: Internal Medicine | Admitting: Internal Medicine

## 2016-05-19 ENCOUNTER — Ambulatory Visit (INDEPENDENT_AMBULATORY_CARE_PROVIDER_SITE_OTHER): Payer: Medicare Other | Admitting: Internal Medicine

## 2016-05-19 VITALS — BP 122/76 | HR 68 | Temp 98.0°F | Resp 12 | Wt 178.5 lb

## 2016-05-19 DIAGNOSIS — Z7901 Long term (current) use of anticoagulants: Secondary | ICD-10-CM

## 2016-05-19 DIAGNOSIS — R8290 Unspecified abnormal findings in urine: Secondary | ICD-10-CM

## 2016-05-19 DIAGNOSIS — M545 Low back pain: Secondary | ICD-10-CM | POA: Diagnosis not present

## 2016-05-19 DIAGNOSIS — R300 Dysuria: Secondary | ICD-10-CM

## 2016-05-19 LAB — POC URINALSYSI DIPSTICK (AUTOMATED)
Bilirubin, UA: NEGATIVE
Blood, UA: NEGATIVE
Glucose, UA: NEGATIVE
Ketones, UA: NEGATIVE
NITRITE UA: NEGATIVE
PH UA: 6
PROTEIN UA: NEGATIVE
Spec Grav, UA: 1.025
UROBILINOGEN UA: 0.2

## 2016-05-19 MED ORDER — NITROFURANTOIN MONOHYD MACRO 100 MG PO CAPS: 100.0000 mg | ORAL_CAPSULE | Freq: Two times a day (BID) | ORAL | Status: DC

## 2016-05-19 NOTE — Patient Instructions (Addendum)
Sounds like early UTI bladder infection .   Culture to be sent and results to be notified .  Will add antibiotic  In the interim  Recheck if not getting better

## 2016-05-19 NOTE — Progress Notes (Signed)
Pre visit review using our clinic review tool, if applicable. No additional management support is needed unless otherwise documented below in the visit note. 

## 2016-05-19 NOTE — Progress Notes (Signed)
Chief Complaint  Patient presents with  . Possible UTI    fatigue, lower back pain    HPI: [name] 82 y.o.  Patient comes in today for [facility] Saturday clinic for  new problem evaluation. Not that good for a few days and not resting  Well last night  And then  Few drops and bunrnig  And  Remote hx of septic infection   Come in  early as instructed   No fever chills Vomiting  AS on anticoagulation ROS: See pertinent positives and negatives per HPI. No cp sob rash bleeding  Past Medical History  Diagnosis Date  . Hypertension   . Chest pain, unspecified   . Atrial fibrillation ([place])   . Mitral valve disorder   . Patent foramen ovale   . Peripheral vascular disease ([place])   . Venous insufficiency   . Hyperlipidemia   . Impaired glucose tolerance   . Hypothyroid   . Diverticulosis of colon   . History of colonic polyps   . History of colon cancer   . Splenic infarction   . History of uterine cancer   . UTI (lower urinary tract infection)   . Osteoarthritis   . Chronic low back pain   . Osteoporosis   . Peripheral neuropathy ([place])   . Anxiety   . Vitamin B12 deficiency   . History of colonoscopy   . Cataract     REMOVED BILATERAL    Family History  Problem Relation Age of Onset  . Cancer Mother     deceased age 31  . Heart failure Father     deceased age 65  . Cancer Brother     deceased age 41  . Stroke Sister     and heart problems; deceased age 73  . Heart disease Sister     Social History   Social History  . Marital Status: Married    Spouse Name: N/A  . Number of Children: 0  . Years of Education: N/A   Occupational History  . retired     part time at [employer]  . Smoking status: Never Smoker   . Smokeless tobacco: Never Used  . Alcohol Use: 0.0 oz/week    0 Standard drinks or equivalent per week     Comment: occ  . Drug Use: No  . Sexual Activity: Not Asked   Other Topics Concern  . None   Social History  Narrative    Outpatient Prescriptions Prior to Visit  Medication Sig Dispense Refill  . ALPRAZolam (XANAX) 0.25 MG tablet Take 1 tablet (0.25 mg total) by mouth at bedtime as needed for sleep. 30 tablet 5  . atorvastatin (LIPITOR) 20 MG tablet Take 20 mg by mouth daily.    . Calcium Carbonate-Vitamin D (CALTRATE 600+D) 600-400 MG-UNIT per tablet Take 1 tablet by mouth daily.      . carvedilol (COREG) 6.25 MG tablet Take 6.25 mg by mouth daily.    Marland Kitchen estradiol (CLIMARA - DOSED IN MG/24 HR) 0.1 mg/24hr Place 1 patch onto the skin once a week. mondaya    . furosemide (LASIX) 40 MG tablet Take 1 tablet (40 mg total) by mouth daily. 90 tablet 1  . ketorolac (ACULAR) 0.5 % ophthalmic solution Place 1 drop into the right eye 2 (two) times daily.    Marland Kitchen levothyroxine (SYNTHROID, LEVOTHROID) 100 MCG tablet Take 1 tablet (100 mcg total) by mouth daily. 90 tablet 3  .  loperamide (IMODIUM A-D) 2 MG tablet Take 2 mg by mouth 4 (four) times daily as needed for diarrhea or loose stools.    . meclizine (ANTIVERT) 25 MG tablet TAKE 1/2 OR 1 TABLET BY MOUTH EVERY 6 HOURS AS NEEDED FOR DIZZINESS 30 tablet 5  . meloxicam (MOBIC) 7.5 MG tablet TAKE 1 TABLET DAILY 90 tablet 0  . Multiple Vitamins-Minerals (CENTRUM SILVER PO) Take 1 tablet by mouth daily.      . potassium chloride SA (K-DUR,KLOR-CON) 20 MEQ tablet Take 1 tablet (20 mEq total) by mouth 2 (two) times daily. 180 tablet 3  . quinapril (ACCUPRIL) 20 MG tablet Take 1 tablet (20 mg total) by mouth daily. 90 tablet 3  . spironolactone (ALDACTONE) 25 MG tablet TAKE 1 TABLET DAILY 90 tablet 1  . valACYclovir (VALTREX) 500 MG tablet Take 1 tablet (500 mg total) by mouth 2 (two) times daily. 60 tablet 5  . vitamin B-12 (CYANOCOBALAMIN) 1000 MCG tablet Take 1,000 mcg by mouth daily.      Marland Kitchen warfarin (COUMADIN) 5 MG tablet TAKE AS DIRECTED BY ANTICOAGULATION CLINIC (Patient taking differently: TAKES 7.5MG  ON SUN, TAKES 5MG  ALL OTHER DAYS) 120 tablet 0   No  facility-administered medications prior to visit.     EXAM:  BP 122/76 mmHg  Pulse 68  Temp(Src) 98 F (36.7 C) (Oral)  Resp 12  Wt 178 lb 8 oz (80.967 kg)  SpO2 98%  Body mass index is 24.91 kg/(m^2).  GENERAL: vitals reviewed and listed above, alert, oriented, appears well hydrated and in no acute distress HEENT: atraumatic, conjunctiva  clear, no obvious abnormalities on inspection of external nose and ears  NECK: no obvious masses on inspection palpation  LUNGS: clear to auscultation bilaterally, no wheezes, rales or rhonchi, good air movement CV: HRRR, 2/6 sem as murmur no clubbing cyanosis or  peripheral edema nl cap refill  Abdomen:  Sof,t normal bowel sounds without hepatosplenomegaly, no guarding rebound or masses no CVA tenderness MS: moves all extremities without noticeable focal  abnormality PSYCH: pleasant and cooperative, no obvious depression or anxiety Lab Results  Component Value Date   WBC 11.4* 02/24/2016   HGB 12.3 02/24/2016   HCT 37.8 02/24/2016   PLT 465.0* 02/24/2016   GLUCOSE 83 02/24/2016   CHOL 139 03/11/2015   TRIG 143.0 03/11/2015   HDL 41.10 03/11/2015   LDLCALC 69 03/11/2015   ALT 17 02/17/2016   AST 23 02/17/2016   NA 138 02/24/2016   K 5.0 02/24/2016   CL 98 02/24/2016   CREATININE 0.84 02/24/2016   BUN 18 02/24/2016   CO2 31 02/24/2016   TSH 12.72* 02/24/2016   INR 2.6 05/08/2016   HGBA1C 6.2 03/24/2014  ua trace leuk  ASSESSMENT AND PLAN:  Discussed the following assessment and plan:  Dysuria - prob early uti with risk use macrobid with nl renal functin thinkwill be safe on short term basis   Low back pain, unspecified back pain laterality, with sciatica presence unspecified - Plan: POCT Urinalysis Dipstick (Automated)  Abnormal urinalysis - Plan: Urine culture  Anticoagulant long-term use  Risk benefit of medication discussed. -Patient advised to return or notify health care team  if symptoms worsen ,persist or new  concerns arise.  Patient Instructions  Sounds like early UTI bladder infection .   Culture to be sent and results to be notified .  Will add antibiotic  In the interim  Recheck if not getting better      Mariann Laster K. Elaura Calix  M.D.  

## 2016-05-21 DIAGNOSIS — M14672 Charcot's joint, left ankle and foot: Secondary | ICD-10-CM | POA: Diagnosis not present

## 2016-05-21 LAB — URINE CULTURE

## 2016-05-23 ENCOUNTER — Other Ambulatory Visit: Payer: Self-pay | Admitting: Family Medicine

## 2016-05-23 ENCOUNTER — Other Ambulatory Visit: Payer: Self-pay | Admitting: Cardiovascular Disease

## 2016-05-23 DIAGNOSIS — M14672 Charcot's joint, left ankle and foot: Secondary | ICD-10-CM | POA: Diagnosis not present

## 2016-05-23 MED ORDER — CEPHALEXIN 250 MG PO CAPS: 250.0000 mg | ORAL_CAPSULE | Freq: Three times a day (TID) | ORAL | Status: DC

## 2016-05-24 ENCOUNTER — Telehealth: Payer: Self-pay | Admitting: Pharmacist

## 2016-05-24 NOTE — Telephone Encounter (Signed)
Pt called to report that she was started on abx for a UTI and was wondering if she needs an INR check sooner. She took Macrobid for the past 5 days and it was just switched to cephalexin yesterday. Advised pt that neither of these antibiotics interact with her Coumadin. She will keep her Coumadin appt with [place] in 2 weeks.

## 2016-05-30 ENCOUNTER — Other Ambulatory Visit: Payer: Self-pay | Admitting: Cardiovascular Disease

## 2016-05-30 DIAGNOSIS — M14672 Charcot's joint, left ankle and foot: Secondary | ICD-10-CM | POA: Diagnosis not present

## 2016-06-04 DIAGNOSIS — H35371 Puckering of macula, right eye: Secondary | ICD-10-CM | POA: Diagnosis not present

## 2016-06-04 DIAGNOSIS — H35362 Drusen (degenerative) of macula, left eye: Secondary | ICD-10-CM | POA: Diagnosis not present

## 2016-06-04 DIAGNOSIS — H35351 Cystoid macular degeneration, right eye: Secondary | ICD-10-CM | POA: Diagnosis not present

## 2016-06-05 ENCOUNTER — Ambulatory Visit (INDEPENDENT_AMBULATORY_CARE_PROVIDER_SITE_OTHER): Payer: Medicare Other | Admitting: *Deleted

## 2016-06-05 DIAGNOSIS — Z7901 Long term (current) use of anticoagulants: Secondary | ICD-10-CM

## 2016-06-05 DIAGNOSIS — I4891 Unspecified atrial fibrillation: Secondary | ICD-10-CM

## 2016-06-05 LAB — POCT INR: INR: 3

## 2016-06-12 ENCOUNTER — Other Ambulatory Visit: Payer: Self-pay | Admitting: Cardiovascular Disease

## 2016-06-19 DIAGNOSIS — M14672 Charcot's joint, left ankle and foot: Secondary | ICD-10-CM | POA: Diagnosis not present

## 2016-06-20 ENCOUNTER — Ambulatory Visit (INDEPENDENT_AMBULATORY_CARE_PROVIDER_SITE_OTHER): Payer: Medicare Other | Admitting: Family Medicine

## 2016-06-20 VITALS — BP 148/89 | HR 76 | Temp 98.3°F | Ht 71.0 in | Wt 174.0 lb

## 2016-06-20 DIAGNOSIS — E038 Other specified hypothyroidism: Secondary | ICD-10-CM

## 2016-06-20 DIAGNOSIS — N390 Urinary tract infection, site not specified: Secondary | ICD-10-CM | POA: Diagnosis not present

## 2016-06-20 DIAGNOSIS — R309 Painful micturition, unspecified: Secondary | ICD-10-CM | POA: Diagnosis not present

## 2016-06-20 DIAGNOSIS — I10 Essential (primary) hypertension: Secondary | ICD-10-CM | POA: Diagnosis not present

## 2016-06-20 DIAGNOSIS — I481 Persistent atrial fibrillation: Secondary | ICD-10-CM

## 2016-06-20 DIAGNOSIS — I4819 Other persistent atrial fibrillation: Secondary | ICD-10-CM

## 2016-06-20 LAB — LIPID PANEL
CHOL/HDL RATIO: 3
Cholesterol: 136 mg/dL (ref 0–200)
HDL: 43.8 mg/dL (ref >39.00)
LDL CALC: 70 mg/dL (ref 0–99)
NONHDL: 92.43
TRIGLYCERIDES: 110 mg/dL (ref 0.0–149.0)
VLDL: 22 mg/dL (ref 0.0–40.0)

## 2016-06-20 LAB — POC URINALSYSI DIPSTICK (AUTOMATED)
BILIRUBIN UA: NEGATIVE
Glucose, UA: NEGATIVE
Ketones, UA: NEGATIVE
LEUKOCYTES UA: NEGATIVE
PH UA: 7.5
PROTEIN UA: NEGATIVE
RBC UA: NEGATIVE
Spec Grav, UA: 1.015
UROBILINOGEN UA: 0.2

## 2016-06-20 LAB — CBC WITH DIFFERENTIAL/PLATELET
BASOS PCT: 0.5 % (ref 0.0–3.0)
Basophils Absolute: 0 10*3/uL (ref 0.0–0.1)
EOS ABS: 0.3 10*3/uL (ref 0.0–0.7)
EOS PCT: 3.4 % (ref 0.0–5.0)
HCT: 38.3 % (ref 36.0–46.0)
HEMOGLOBIN: 12.7 g/dL (ref 12.0–15.0)
LYMPHS PCT: 16.9 % (ref 12.0–46.0)
Lymphs Abs: 1.3 10*3/uL (ref 0.7–4.0)
MCHC: 33.2 g/dL (ref 30.0–36.0)
MCV: 91.6 fl (ref 78.0–100.0)
MONO ABS: 0.6 10*3/uL (ref 0.1–1.0)
Monocytes Relative: 7 % (ref 3.0–12.0)
NEUTROS PCT: 72.2 % (ref 43.0–77.0)
Neutro Abs: 5.7 10*3/uL (ref 1.4–7.7)
PLATELETS: 296 10*3/uL (ref 150.0–400.0)
RBC: 4.19 Mil/uL (ref 3.87–5.11)
RDW: 14.9 % (ref 11.5–15.5)
WBC: 7.9 10*3/uL (ref 4.0–10.5)

## 2016-06-20 LAB — HEPATIC FUNCTION PANEL
ALT: 11 U/L (ref 0–35)
AST: 18 U/L (ref 0–37)
Albumin: 4.5 g/dL (ref 3.5–5.2)
Alkaline Phosphatase: 75 U/L (ref 39–117)
BILIRUBIN DIRECT: 0.2 mg/dL (ref 0.0–0.3)
BILIRUBIN TOTAL: 1.1 mg/dL (ref 0.2–1.2)
Total Protein: 7.3 g/dL (ref 6.0–8.3)

## 2016-06-20 LAB — BASIC METABOLIC PANEL
BUN: 20 mg/dL (ref 6–23)
CALCIUM: 9.8 mg/dL (ref 8.4–10.5)
CO2: 28 mEq/L (ref 19–32)
CREATININE: 0.89 mg/dL (ref 0.40–1.20)
Chloride: 104 mEq/L (ref 96–112)
GFR: 64.03 mL/min (ref >60.00)
Glucose, Bld: 92 mg/dL (ref 70–99)
Potassium: 4.8 mEq/L (ref 3.5–5.1)
Sodium: 139 mEq/L (ref 135–145)

## 2016-06-20 MED ORDER — CIPROFLOXACIN HCL 500 MG PO TABS: 500.0000 mg | ORAL_TABLET | Freq: Two times a day (BID) | ORAL | 0 refills | Status: DC

## 2016-06-20 NOTE — Progress Notes (Signed)
Pre visit review using our clinic review tool, if applicable. No additional management support is needed unless otherwise documented below in the visit note. 

## 2016-06-20 NOTE — Progress Notes (Signed)
   Subjective:    Patient ID: [name], female    DOB: 03/25/1929, 82 y.o.   MRN: [id number]  HPI Here to follow up on BP, lipids, and a UTI. She was seen on 05-19-16 for a Klebsiella UTI. She was given Macrobid at first and then she took 5 days of Keflex. She never totally got rid of her symptoms however, such as burning and urgency to urinate. No fever.    Review of Systems  Constitutional: Negative.   Respiratory: Negative.   Cardiovascular: Negative.   Genitourinary: Positive for dysuria, frequency and urgency. Negative for flank pain and pelvic pain.       Objective:   Physical Exam  Constitutional: She appears well-developed and well-nourished.  Cardiovascular: Normal rate, regular rhythm, normal heart sounds and intact distal pulses.   Pulmonary/Chest: Effort normal and breath sounds normal.          Assessment & Plan:  Partially treated UTI, given Cipro for 7 days. reculture the urine. Get fasting labs as well.  Laurey Morale, MD

## 2016-06-21 ENCOUNTER — Other Ambulatory Visit: Payer: Self-pay | Admitting: Family Medicine

## 2016-06-21 LAB — TSH: TSH: 4.35 u[IU]/mL (ref 0.35–4.50)

## 2016-06-22 ENCOUNTER — Ambulatory Visit (INDEPENDENT_AMBULATORY_CARE_PROVIDER_SITE_OTHER): Payer: Medicare Other | Admitting: *Deleted

## 2016-06-22 DIAGNOSIS — I4891 Unspecified atrial fibrillation: Secondary | ICD-10-CM

## 2016-06-22 DIAGNOSIS — Z7901 Long term (current) use of anticoagulants: Secondary | ICD-10-CM

## 2016-06-22 LAB — POCT INR: INR: 3.2

## 2016-06-23 LAB — URINE CULTURE: Colony Count: >=100000

## 2016-06-28 ENCOUNTER — Ambulatory Visit (INDEPENDENT_AMBULATORY_CARE_PROVIDER_SITE_OTHER): Payer: Medicare Other

## 2016-06-28 DIAGNOSIS — I4891 Unspecified atrial fibrillation: Secondary | ICD-10-CM | POA: Diagnosis not present

## 2016-06-28 DIAGNOSIS — Z7901 Long term (current) use of anticoagulants: Secondary | ICD-10-CM | POA: Diagnosis not present

## 2016-06-28 LAB — POCT INR: INR: 2.5

## 2016-07-04 DIAGNOSIS — N390 Urinary tract infection, site not specified: Secondary | ICD-10-CM | POA: Diagnosis not present

## 2016-07-04 DIAGNOSIS — Z6825 Body mass index (BMI) 25.0-25.9, adult: Secondary | ICD-10-CM | POA: Diagnosis not present

## 2016-07-04 DIAGNOSIS — Z01419 Encounter for gynecological examination (general) (routine) without abnormal findings: Secondary | ICD-10-CM | POA: Diagnosis not present

## 2016-07-04 DIAGNOSIS — N760 Acute vaginitis: Secondary | ICD-10-CM | POA: Diagnosis not present

## 2016-07-19 ENCOUNTER — Ambulatory Visit (INDEPENDENT_AMBULATORY_CARE_PROVIDER_SITE_OTHER): Payer: Medicare Other

## 2016-07-19 DIAGNOSIS — Z7901 Long term (current) use of anticoagulants: Secondary | ICD-10-CM | POA: Diagnosis not present

## 2016-07-19 DIAGNOSIS — I4891 Unspecified atrial fibrillation: Secondary | ICD-10-CM

## 2016-07-19 LAB — POCT INR: INR: 3.6

## 2016-07-24 ENCOUNTER — Other Ambulatory Visit: Payer: Self-pay

## 2016-08-09 ENCOUNTER — Ambulatory Visit (INDEPENDENT_AMBULATORY_CARE_PROVIDER_SITE_OTHER): Payer: Medicare Other | Admitting: Family Medicine

## 2016-08-09 ENCOUNTER — Ambulatory Visit (INDEPENDENT_AMBULATORY_CARE_PROVIDER_SITE_OTHER): Payer: Medicare Other | Admitting: *Deleted

## 2016-08-09 ENCOUNTER — Encounter: Payer: Self-pay | Admitting: Family Medicine

## 2016-08-09 VITALS — BP 104/71 | HR 69 | Temp 98.1°F | Ht 71.0 in | Wt 171.0 lb

## 2016-08-09 DIAGNOSIS — N390 Urinary tract infection, site not specified: Secondary | ICD-10-CM

## 2016-08-09 DIAGNOSIS — Z7901 Long term (current) use of anticoagulants: Secondary | ICD-10-CM | POA: Diagnosis not present

## 2016-08-09 DIAGNOSIS — I4891 Unspecified atrial fibrillation: Secondary | ICD-10-CM

## 2016-08-09 LAB — POC URINALSYSI DIPSTICK (AUTOMATED)
BILIRUBIN UA: NEGATIVE
GLUCOSE UA: NEGATIVE
Ketones, UA: NEGATIVE
LEUKOCYTES UA: NEGATIVE
NITRITE UA: NEGATIVE
PH UA: 6
Protein, UA: NEGATIVE
RBC UA: NEGATIVE
Spec Grav, UA: 1.025
UROBILINOGEN UA: 0.2

## 2016-08-09 LAB — POCT INR: INR: 3

## 2016-08-09 MED ORDER — CIPROFLOXACIN HCL 500 MG PO TABS: 500.0000 mg | ORAL_TABLET | Freq: Two times a day (BID) | ORAL | 0 refills | Status: DC

## 2016-08-09 NOTE — Progress Notes (Signed)
   Subjective:    Patient ID: [name], female    DOB: August 27, 1929, 82 y.o.   MRN: [id number]  HPI Here for a possible UTI. She has already had 3 UTIs this year (Jan, June, and July). Now for the past 3 days she has lower back pain, increased urgency to urinate, and burning. No nausea or fever. She saw Dr. Nori Riis a few weeks ago and had a normal GYN exam, but he was concerned about the recurrent UTIs. He referred her to Urology and she is scheduled to see them on 08-28-16.    Review of Systems  Constitutional: Negative.   Respiratory: Negative.   Cardiovascular: Negative.   Gastrointestinal: Negative.   Genitourinary: Positive for dysuria, frequency and urgency. Negative for flank pain, hematuria and pelvic pain.       Objective:   Physical Exam  Constitutional: She appears well-developed and well-nourished. No distress.  Abdominal: Soft. Bowel sounds are normal. She exhibits no distension and no mass. There is no tenderness. There is no rebound and no guarding.          Assessment & Plan:  Recurrent UTIs. Even though her UA today is clear, her symptoms indicate another UTI, so we will start her on Cipro for 7 days. Culture the sample. She will see Urology as above.  Laurey Morale, MD

## 2016-08-09 NOTE — Addendum Note (Signed)
Addended by: Aggie Hacker A on: 08/09/2016 10:57 AM   Modules accepted: Orders

## 2016-08-09 NOTE — Addendum Note (Signed)
Addended by: Alysia Penna A on: 08/09/2016 11:23 AM   Modules accepted: Orders

## 2016-08-09 NOTE — Progress Notes (Signed)
Pre visit review using our clinic review tool, if applicable. No additional management support is needed unless otherwise documented below in the visit note. 

## 2016-08-11 LAB — URINE CULTURE

## 2016-08-14 ENCOUNTER — Ambulatory Visit (INDEPENDENT_AMBULATORY_CARE_PROVIDER_SITE_OTHER): Payer: Medicare Other | Admitting: *Deleted

## 2016-08-14 DIAGNOSIS — I4891 Unspecified atrial fibrillation: Secondary | ICD-10-CM | POA: Diagnosis not present

## 2016-08-14 DIAGNOSIS — Z7901 Long term (current) use of anticoagulants: Secondary | ICD-10-CM | POA: Diagnosis not present

## 2016-08-14 LAB — POCT INR: INR: 1.8

## 2016-08-21 ENCOUNTER — Other Ambulatory Visit: Payer: Self-pay

## 2016-08-21 MED ORDER — WARFARIN SODIUM 5 MG PO TABS: ORAL_TABLET | ORAL | 1 refills | Status: DC

## 2016-08-21 NOTE — Telephone Encounter (Signed)
I am routing this refill for her coumadin over. Patient states that she is out of her coumadin. She asked to please be notified once her refills are sent in. Thank you for your time.

## 2016-08-21 NOTE — Telephone Encounter (Signed)
Attempted to return call to pt, LMOM TCB.  Refill requested, however unsure where pt wants refill sent to given message states pt is presently out of Coumadin,  Warfarin refills are historically sent mail order.  Will await call back to send rx to pharmacy.

## 2016-08-24 ENCOUNTER — Ambulatory Visit (INDEPENDENT_AMBULATORY_CARE_PROVIDER_SITE_OTHER): Payer: Medicare Other | Admitting: *Deleted

## 2016-08-24 DIAGNOSIS — Z7901 Long term (current) use of anticoagulants: Secondary | ICD-10-CM | POA: Diagnosis not present

## 2016-08-24 DIAGNOSIS — I4891 Unspecified atrial fibrillation: Secondary | ICD-10-CM

## 2016-08-24 LAB — POCT INR: INR: 2.6

## 2016-08-31 DIAGNOSIS — Z23 Encounter for immunization: Secondary | ICD-10-CM | POA: Diagnosis not present

## 2016-09-03 DIAGNOSIS — N952 Postmenopausal atrophic vaginitis: Secondary | ICD-10-CM | POA: Diagnosis not present

## 2016-09-03 DIAGNOSIS — N3000 Acute cystitis without hematuria: Secondary | ICD-10-CM | POA: Diagnosis not present

## 2016-09-10 ENCOUNTER — Other Ambulatory Visit: Payer: Self-pay | Admitting: Cardiovascular Disease

## 2016-09-10 ENCOUNTER — Ambulatory Visit (INDEPENDENT_AMBULATORY_CARE_PROVIDER_SITE_OTHER): Payer: Medicare Other | Admitting: *Deleted

## 2016-09-10 DIAGNOSIS — Z7901 Long term (current) use of anticoagulants: Secondary | ICD-10-CM

## 2016-09-10 DIAGNOSIS — I4891 Unspecified atrial fibrillation: Secondary | ICD-10-CM

## 2016-09-10 LAB — POCT INR: INR: 2.4

## 2016-10-08 ENCOUNTER — Ambulatory Visit (INDEPENDENT_AMBULATORY_CARE_PROVIDER_SITE_OTHER): Payer: Medicare Other | Admitting: *Deleted

## 2016-10-08 DIAGNOSIS — I4891 Unspecified atrial fibrillation: Secondary | ICD-10-CM | POA: Diagnosis not present

## 2016-10-08 DIAGNOSIS — Z7901 Long term (current) use of anticoagulants: Secondary | ICD-10-CM | POA: Diagnosis not present

## 2016-10-08 LAB — POCT INR: INR: 2.3

## 2016-10-24 ENCOUNTER — Other Ambulatory Visit: Payer: Self-pay | Admitting: Cardiovascular Disease

## 2016-10-24 NOTE — Progress Notes (Signed)
Patient ID: ELLESSE SOPER, female   DOB: Jan 07, 1929, 82 y.o.   MRN: [id number]  [name] is seen today for f/U of afib, HTN, and edema. Her coumadin has been Rx. No palpitations, SOB or SSCP. She is walking on a regular basis without symptoms. She retired from [employer] 2 years ago . Her husband [name] died and she is now living in apartment near [place] has worked well as a diuretic for edema as she requires much less K. She had a splenic infarct when enrolled in the engage trial and we have not thought about changing her to Pradaxa    Started back doing water aerobics at the [facility]   I care for her sister [name] who had a stroke recently Her and husband [name] who is also a patient of mine moved into [facility]   Echo:  04/17/16 reviewed stable mild AS  Study Conclusions  - Left ventricle: The cavity size was normal. Systolic function was   normal. The estimated ejection fraction was in the range of 60%   to 65%. Wall motion was normal; there were no regional wall   motion abnormalities. The study was not technically sufficient to   allow evaluation of LV diastolic dysfunction due to atrial   fibrillation. - Aortic valve: Severe diffuse thickening and calcification. Valve   mobility was restricted. There was mild stenosis. Mean gradient   (S): 13 mm Hg. Valve area (Vmax): 1.23 cm^2. - Mitral valve: Calcified annulus. Moderate diffuse thickening and   calcification of the anterior leaflet and posterior leaflet.   Mobility of the anterior leaflet was severely restricted.   Diastolic leaflet doming was present. The findings are consistent   with severe stenosis. There was mild to moderate regurgitation.   Mean gradient (D): 14 mm Hg. Valve area by pressure half-time:   1.33 cm^2. - Left atrium: The atrium was massively dilated. - Right ventricle: The cavity size was severely dilated. Wall   thickness was normal. - Right atrium: The atrium was severely dilated. -  Pulmonary arteries: PA peak pressure: 56 mm Hg (S).  Has had UTI and flu requiring 2 hospitalizations this winter Charcot foot LLE seeing [facility]    ROS: Denies fever, malais, weight loss, blurry vision, decreased visual acuity, cough, sputum, SOB, hemoptysis, pleuritic pain, palpitaitons, heartburn, abdominal pain, melena, lower extremity edema, claudication, or rash.  All other systems reviewed and negative  General: Affect appropriate Healthy:  appears stated age 82: normal Neck supple with no adenopathy JVP normal no bruits no thyromegaly Lungs clear with no wheezing and good diaphragmatic motion Heart:  S1/S2 AS  murmur, no rub, gallop or click PMI normal Abdomen: benighn, BS positve, no tenderness, no AAA no bruit.  No HSM or HJR Distal pulses intact with no bruits No edema left foot in walking boot Left foot in boot with healing ulcer  Skin warm and dry No muscular weakness   Current Outpatient Prescriptions  Medication Sig Dispense Refill  . ALPRAZolam (XANAX) 0.25 MG tablet Take 1 tablet (0.25 mg total) by mouth at bedtime as needed for sleep. 30 tablet 5  . atorvastatin (LIPITOR) 20 MG tablet TAKE 1 TABLET DAILY 90 tablet 3  . Calcium Carbonate-Vitamin D (CALTRATE 600+D) 600-400 MG-UNIT per tablet Take 1 tablet by mouth daily.      . carvedilol (COREG) 6.25 MG tablet TAKE 1 TABLET TWICE A DAY 180 tablet 3  . estradiol (CLIMARA - DOSED IN MG/24 HR) 0.1 mg/24hr Place  1 patch onto the skin once a week. mondaya    . furosemide (LASIX) 40 MG tablet TAKE 1 TABLET DAILY 90 tablet 2  . ketorolac (ACULAR) 0.5 % ophthalmic solution Place 1 drop into the right eye 2 (two) times daily.    Marland Kitchen levothyroxine (SYNTHROID, LEVOTHROID) 100 MCG tablet Take 1 tablet (100 mcg total) by mouth daily. 90 tablet 3  . loperamide (IMODIUM A-D) 2 MG tablet Take 2 mg by mouth 4 (four) times daily as needed for diarrhea or loose stools.    . meclizine (ANTIVERT) 25 MG tablet TAKE 1/2 OR 1 TABLET BY  MOUTH EVERY 6 HOURS AS NEEDED FOR DIZZINESS 30 tablet 5  . meloxicam (MOBIC) 7.5 MG tablet TAKE 1 TABLET DAILY 90 tablet 1  . Multiple Vitamins-Minerals (CENTRUM SILVER PO) Take 1 tablet by mouth daily.      . potassium chloride SA (K-DUR,KLOR-CON) 20 MEQ tablet TAKE 1 TABLET TWICE A DAY 180 tablet 2  . quinapril (ACCUPRIL) 20 MG tablet TAKE 1 TABLET DAILY 90 tablet 2  . spironolactone (ALDACTONE) 25 MG tablet Take 1 tablet (25 mg total) by mouth daily. 90 tablet 1  . vitamin B-12 (CYANOCOBALAMIN) 1000 MCG tablet Take 1,000 mcg by mouth daily.      Marland Kitchen warfarin (COUMADIN) 5 MG tablet TAKE AS DIRECTED BY ANTICOAGULATION CLINIC 100 tablet 1   No current facility-administered medications for this visit.     Allergies  Codeine  Electrocardiogram:  4/14  afib rate 64  Nonspecific ST/T wave changes  Today afib rate 63 nonspecific St changes similar to 2014  10/04/15  afib rate 62 nonspecific ST changes   Assessment and Plan Afib:  Chronic good rate control INR Rx no bleeding issues stable  Neuropathy:  F/u Regal ulcer healed done with course of antibiotics HTN:  Well controlled.  Continue current medications and low sodium Dash type diet.   Chol:  On statin  Cholesterol is at goal.  Continue current dose of statin and diet Rx.  No myalgias or side effects.  F/U  LFT's in 6 months. Lab Results  Component Value Date   LDLCALC 70 06/20/2016   AS: mild on echo 04/17/16  Mean gradient 13 mmHg  F/u echo   Charcot Foot: f/u [facility] continue orthotic implants for shoes        F/U with me in 6 months   Jenkins Rouge

## 2016-10-25 ENCOUNTER — Encounter (INDEPENDENT_AMBULATORY_CARE_PROVIDER_SITE_OTHER): Payer: Self-pay

## 2016-10-25 ENCOUNTER — Ambulatory Visit (INDEPENDENT_AMBULATORY_CARE_PROVIDER_SITE_OTHER): Payer: Medicare Other | Admitting: Cardiovascular Disease

## 2016-10-25 VITALS — BP 110/62 | HR 71 | Ht 71.0 in | Wt 173.0 lb

## 2016-10-25 DIAGNOSIS — I482 Chronic atrial fibrillation, unspecified: Secondary | ICD-10-CM

## 2016-10-25 NOTE — Patient Instructions (Signed)

## 2016-11-06 ENCOUNTER — Ambulatory Visit (INDEPENDENT_AMBULATORY_CARE_PROVIDER_SITE_OTHER): Payer: Medicare Other | Admitting: *Deleted

## 2016-11-06 DIAGNOSIS — Z7901 Long term (current) use of anticoagulants: Secondary | ICD-10-CM | POA: Diagnosis not present

## 2016-11-06 DIAGNOSIS — I4891 Unspecified atrial fibrillation: Secondary | ICD-10-CM

## 2016-11-06 LAB — POCT INR: INR: 2.2

## 2016-11-07 ENCOUNTER — Other Ambulatory Visit: Payer: Self-pay | Admitting: Family Medicine

## 2016-11-07 NOTE — Telephone Encounter (Signed)
Call in #[phone] with 5 rf 

## 2016-11-21 ENCOUNTER — Other Ambulatory Visit: Payer: Self-pay | Admitting: Family Medicine

## 2016-12-06 ENCOUNTER — Other Ambulatory Visit: Payer: Self-pay | Admitting: *Deleted

## 2016-12-06 MED ORDER — QUINAPRIL HCL 20 MG PO TABS: 20.0000 mg | ORAL_TABLET | Freq: Every day | ORAL | 0 refills | Status: DC

## 2016-12-17 ENCOUNTER — Ambulatory Visit (INDEPENDENT_AMBULATORY_CARE_PROVIDER_SITE_OTHER): Payer: Medicare Other

## 2016-12-17 DIAGNOSIS — I4891 Unspecified atrial fibrillation: Secondary | ICD-10-CM

## 2016-12-17 DIAGNOSIS — H31011 Macula scars of posterior pole (postinflammatory) (post-traumatic), right eye: Secondary | ICD-10-CM | POA: Diagnosis not present

## 2016-12-17 DIAGNOSIS — H5213 Myopia, bilateral: Secondary | ICD-10-CM | POA: Diagnosis not present

## 2016-12-17 DIAGNOSIS — Z7901 Long term (current) use of anticoagulants: Secondary | ICD-10-CM

## 2016-12-17 DIAGNOSIS — Z961 Presence of intraocular lens: Secondary | ICD-10-CM | POA: Diagnosis not present

## 2016-12-17 DIAGNOSIS — H524 Presbyopia: Secondary | ICD-10-CM | POA: Diagnosis not present

## 2016-12-17 DIAGNOSIS — H52203 Unspecified astigmatism, bilateral: Secondary | ICD-10-CM | POA: Diagnosis not present

## 2016-12-17 LAB — POCT INR: INR: 2.3

## 2016-12-18 ENCOUNTER — Other Ambulatory Visit: Payer: Self-pay | Admitting: Family Medicine

## 2016-12-18 NOTE — Telephone Encounter (Signed)
Can we refill this? 

## 2017-01-08 DIAGNOSIS — N952 Postmenopausal atrophic vaginitis: Secondary | ICD-10-CM | POA: Diagnosis not present

## 2017-01-08 DIAGNOSIS — R300 Dysuria: Secondary | ICD-10-CM | POA: Diagnosis not present

## 2017-01-11 ENCOUNTER — Ambulatory Visit (INDEPENDENT_AMBULATORY_CARE_PROVIDER_SITE_OTHER): Payer: Medicare Other | Admitting: Family Medicine

## 2017-01-11 ENCOUNTER — Encounter: Payer: Self-pay | Admitting: Family Medicine

## 2017-01-11 VITALS — BP 124/83 | HR 83 | Temp 98.2°F | Wt 177.0 lb

## 2017-01-11 DIAGNOSIS — E782 Mixed hyperlipidemia: Secondary | ICD-10-CM

## 2017-01-11 DIAGNOSIS — I10 Essential (primary) hypertension: Secondary | ICD-10-CM | POA: Diagnosis not present

## 2017-01-11 DIAGNOSIS — I482 Chronic atrial fibrillation, unspecified: Secondary | ICD-10-CM

## 2017-01-11 LAB — LIPID PANEL
CHOLESTEROL: 131 mg/dL (ref 0–200)
HDL: 39.5 mg/dL (ref >39.00)
LDL Cholesterol: 74 mg/dL (ref 0–99)
NonHDL: 91.52
TRIGLYCERIDES: 90 mg/dL (ref 0.0–149.0)
Total CHOL/HDL Ratio: 3
VLDL: 18 mg/dL (ref 0.0–40.0)

## 2017-01-11 LAB — HEPATIC FUNCTION PANEL
ALBUMIN: 4.4 g/dL (ref 3.5–5.2)
ALK PHOS: 90 U/L (ref 39–117)
ALT: 14 U/L (ref 0–35)
AST: 18 U/L (ref 0–37)
Bilirubin, Direct: 0.3 mg/dL (ref 0.0–0.3)
Total Bilirubin: 1.3 mg/dL — ABNORMAL HIGH (ref 0.2–1.2)
Total Protein: 7 g/dL (ref 6.0–8.3)

## 2017-01-11 LAB — BASIC METABOLIC PANEL
BUN: 22 mg/dL (ref 6–23)
CO2: 26 mEq/L (ref 19–32)
Calcium: 9.6 mg/dL (ref 8.4–10.5)
Chloride: 106 mEq/L (ref 96–112)
Creatinine, Ser: 0.91 mg/dL (ref 0.40–1.20)
GFR: 62.32 mL/min (ref >60.00)
GLUCOSE: 105 mg/dL — AB (ref 70–99)
POTASSIUM: 4.4 meq/L (ref 3.5–5.1)
SODIUM: 141 meq/L (ref 135–145)

## 2017-01-11 NOTE — Progress Notes (Signed)
Pre visit review using our clinic review tool, if applicable. No additional management support is needed unless otherwise documented below in the visit note. 

## 2017-01-11 NOTE — Progress Notes (Signed)
   Subjective:    Patient ID: [name], female    DOB: 1928-12-11, 83 y.o.   MRN: [id number]  HPI Here to follow up. She feels well. Her arthritis pain is fairly well controlled. Her atrial fibrillation and HTN are stable. She saw Dr. Johnsie Cancel in November.    Review of Systems  Constitutional: Negative.   Respiratory: Negative.   Cardiovascular: Negative.   Neurological: Negative.        Objective:   Physical Exam  Constitutional: She is oriented to person, place, and time. She appears well-developed and well-nourished.  Neck: No thyromegaly present.  Cardiovascular: Normal rate and intact distal pulses.   Irregular rhythm. Stable 2/6 SM   Pulmonary/Chest: Effort normal and breath sounds normal. No respiratory distress.  Lymphadenopathy:    She has no cervical adenopathy.  Neurological: She is alert and oriented to person, place, and time.          Assessment & Plan:  She is doing well from a cardiac standpoint. We will get fasting labs today to check lipids, etc. Alysia Penna, MD

## 2017-01-16 ENCOUNTER — Telehealth: Payer: Self-pay | Admitting: Family Medicine

## 2017-01-16 NOTE — Telephone Encounter (Signed)
Pt returning your call concerning lab results °

## 2017-01-25 ENCOUNTER — Ambulatory Visit (INDEPENDENT_AMBULATORY_CARE_PROVIDER_SITE_OTHER): Payer: Medicare Other | Admitting: Pharmacist

## 2017-01-25 DIAGNOSIS — I482 Chronic atrial fibrillation, unspecified: Secondary | ICD-10-CM

## 2017-01-25 DIAGNOSIS — Z7901 Long term (current) use of anticoagulants: Secondary | ICD-10-CM | POA: Diagnosis not present

## 2017-01-25 LAB — POCT INR: INR: 2.1

## 2017-02-10 ENCOUNTER — Other Ambulatory Visit: Payer: Self-pay | Admitting: Cardiovascular Disease

## 2017-02-24 ENCOUNTER — Other Ambulatory Visit: Payer: Self-pay | Admitting: Cardiovascular Disease

## 2017-02-24 ENCOUNTER — Other Ambulatory Visit: Payer: Self-pay | Admitting: Family Medicine

## 2017-03-08 ENCOUNTER — Ambulatory Visit (INDEPENDENT_AMBULATORY_CARE_PROVIDER_SITE_OTHER): Payer: Medicare Other

## 2017-03-08 DIAGNOSIS — Z7901 Long term (current) use of anticoagulants: Secondary | ICD-10-CM

## 2017-03-08 DIAGNOSIS — I482 Chronic atrial fibrillation, unspecified: Secondary | ICD-10-CM

## 2017-03-08 LAB — POCT INR: INR: 2.1

## 2017-03-22 ENCOUNTER — Telehealth: Payer: Self-pay | Admitting: Family Medicine

## 2017-03-22 NOTE — Telephone Encounter (Signed)
° ° °  Pt call to ask if Dr Sarajane Jews would write a letter stating  that she need to be in a senior living facility. The reason that she need the letter is because her lease where she currently lives does not expire until July. She would like to move in May  And she has put her deposit down.     [phone] may leave a message

## 2017-03-22 NOTE — Telephone Encounter (Signed)
Have her make an OV so we can write the letter together

## 2017-03-27 ENCOUNTER — Encounter: Payer: Self-pay | Admitting: Family Medicine

## 2017-03-27 ENCOUNTER — Ambulatory Visit (INDEPENDENT_AMBULATORY_CARE_PROVIDER_SITE_OTHER): Payer: Medicare Other | Admitting: Family Medicine

## 2017-03-27 ENCOUNTER — Ambulatory Visit: Payer: Medicare Other | Admitting: Family Medicine

## 2017-03-27 VITALS — BP 141/97 | HR 71 | Temp 98.5°F | Ht 71.0 in | Wt 176.0 lb

## 2017-03-27 DIAGNOSIS — M5440 Lumbago with sciatica, unspecified side: Secondary | ICD-10-CM | POA: Diagnosis not present

## 2017-03-27 DIAGNOSIS — I10 Essential (primary) hypertension: Secondary | ICD-10-CM

## 2017-03-27 DIAGNOSIS — I480 Paroxysmal atrial fibrillation: Secondary | ICD-10-CM | POA: Diagnosis not present

## 2017-03-27 DIAGNOSIS — I350 Nonrheumatic aortic (valve) stenosis: Secondary | ICD-10-CM

## 2017-03-27 DIAGNOSIS — M159 Polyosteoarthritis, unspecified: Secondary | ICD-10-CM

## 2017-03-27 DIAGNOSIS — M14672 Charcot's joint, left ankle and foot: Secondary | ICD-10-CM | POA: Diagnosis not present

## 2017-03-27 DIAGNOSIS — M150 Primary generalized (osteo)arthritis: Secondary | ICD-10-CM

## 2017-03-27 NOTE — Progress Notes (Signed)
   Subjective:    Patient ID: TARIA CASTRILLO, female    DOB: 1929-02-20, 83 y.o.   MRN: [id number]  HPI Here for help with a letter to her landlord. She has been living in an apartment by herself, and her current lease runs through July 15. However due to a number of health concerns she has arranged to move into [facility], a local assisted living facility. She plans to move in there by the end of May. Her current landlord is requiring a letter from her doctor detailing why it is necessary to void her lease early. Her chronic issues like atrial fibrillation, low back pain, etc have made it difficult to live on her own.    Review of Systems  Constitutional: Negative.   Respiratory: Negative.   Cardiovascular: Negative.   Musculoskeletal: Positive for back pain.  Neurological: Negative.        Objective:   Physical Exam  Constitutional: She is oriented to person, place, and time. She appears well-developed and well-nourished.  Cardiovascular: Normal rate, normal heart sounds and intact distal pulses.   Irregular rhythm  Pulmonary/Chest: Effort normal and breath sounds normal.  Neurological: She is alert and oriented to person, place, and time.          Assessment & Plan:  She is moving to assisted living due to numerous health issues, all of which are fairly stable at the moment. We did write a letter for her landlord documenting this.  Alysia Penna, MD

## 2017-03-27 NOTE — Patient Instructions (Signed)
WE NOW OFFER   [name]'s FAST TRACK!!!  SAME DAY Appointments for ACUTE CARE  Such as: Sprains, Injuries, cuts, abrasions, rashes, muscle pain, joint pain, back pain Colds, flu, sore throats, headache, allergies, cough, fever  Ear pain, sinus and eye infections Abdominal pain, nausea, vomiting, diarrhea, upset stomach Animal/insect bites  3 Easy Ways to Schedule: Walk-In Scheduling Call in scheduling Mychart Sign-up: [email or web address]         

## 2017-03-27 NOTE — Progress Notes (Signed)
Pre visit review using our clinic review tool, if applicable. No additional management support is needed unless otherwise documented below in the visit note. 

## 2017-04-02 ENCOUNTER — Other Ambulatory Visit: Payer: Self-pay | Admitting: *Deleted

## 2017-04-02 ENCOUNTER — Encounter (HOSPITAL_BASED_OUTPATIENT_CLINIC_OR_DEPARTMENT_OTHER): Payer: Self-pay | Admitting: Emergency Medicine

## 2017-04-02 ENCOUNTER — Inpatient Hospital Stay (HOSPITAL_BASED_OUTPATIENT_CLINIC_OR_DEPARTMENT_OTHER)
Admission: EM | Admit: 2017-04-02 | Discharge: 2017-04-04 | DRG: 389 | Disposition: A | Discharge status: Home / Self Care | Payer: Medicare Other | Attending: Family Medicine | Admitting: Family Medicine

## 2017-04-02 ENCOUNTER — Emergency Department (HOSPITAL_BASED_OUTPATIENT_CLINIC_OR_DEPARTMENT_OTHER): Payer: Medicare Other

## 2017-04-02 DIAGNOSIS — E782 Mixed hyperlipidemia: Secondary | ICD-10-CM | POA: Diagnosis not present

## 2017-04-02 DIAGNOSIS — M1990 Unspecified osteoarthritis, unspecified site: Secondary | ICD-10-CM | POA: Diagnosis present

## 2017-04-02 DIAGNOSIS — E785 Hyperlipidemia, unspecified: Secondary | ICD-10-CM | POA: Diagnosis present

## 2017-04-02 DIAGNOSIS — I110 Hypertensive heart disease with heart failure: Secondary | ICD-10-CM | POA: Diagnosis present

## 2017-04-02 DIAGNOSIS — I5032 Chronic diastolic (congestive) heart failure: Secondary | ICD-10-CM | POA: Diagnosis present

## 2017-04-02 DIAGNOSIS — I4891 Unspecified atrial fibrillation: Secondary | ICD-10-CM | POA: Diagnosis present

## 2017-04-02 DIAGNOSIS — Z8542 Personal history of malignant neoplasm of other parts of uterus: Secondary | ICD-10-CM

## 2017-04-02 DIAGNOSIS — E039 Hypothyroidism, unspecified: Secondary | ICD-10-CM | POA: Diagnosis present

## 2017-04-02 DIAGNOSIS — Z85038 Personal history of other malignant neoplasm of large intestine: Secondary | ICD-10-CM

## 2017-04-02 DIAGNOSIS — K56609 Unspecified intestinal obstruction, unspecified as to partial versus complete obstruction: Secondary | ICD-10-CM | POA: Diagnosis not present

## 2017-04-02 DIAGNOSIS — Z7901 Long term (current) use of anticoagulants: Secondary | ICD-10-CM

## 2017-04-02 DIAGNOSIS — M810 Age-related osteoporosis without current pathological fracture: Secondary | ICD-10-CM | POA: Diagnosis present

## 2017-04-02 DIAGNOSIS — E538 Deficiency of other specified B group vitamins: Secondary | ICD-10-CM | POA: Diagnosis present

## 2017-04-02 DIAGNOSIS — I480 Paroxysmal atrial fibrillation: Secondary | ICD-10-CM | POA: Diagnosis not present

## 2017-04-02 DIAGNOSIS — I482 Chronic atrial fibrillation: Secondary | ICD-10-CM | POA: Diagnosis present

## 2017-04-02 DIAGNOSIS — Z9049 Acquired absence of other specified parts of digestive tract: Secondary | ICD-10-CM

## 2017-04-02 DIAGNOSIS — G629 Polyneuropathy, unspecified: Secondary | ICD-10-CM | POA: Diagnosis present

## 2017-04-02 DIAGNOSIS — I10 Essential (primary) hypertension: Secondary | ICD-10-CM | POA: Diagnosis not present

## 2017-04-02 DIAGNOSIS — Z90710 Acquired absence of both cervix and uterus: Secondary | ICD-10-CM

## 2017-04-02 DIAGNOSIS — F419 Anxiety disorder, unspecified: Secondary | ICD-10-CM | POA: Diagnosis present

## 2017-04-02 DIAGNOSIS — K56600 Partial intestinal obstruction, unspecified as to cause: Principal | ICD-10-CM | POA: Diagnosis present

## 2017-04-02 DIAGNOSIS — Z66 Do not resuscitate: Secondary | ICD-10-CM | POA: Diagnosis present

## 2017-04-02 DIAGNOSIS — E038 Other specified hypothyroidism: Secondary | ICD-10-CM

## 2017-04-02 DIAGNOSIS — Z79899 Other long term (current) drug therapy: Secondary | ICD-10-CM

## 2017-04-02 DIAGNOSIS — R17 Unspecified jaundice: Secondary | ICD-10-CM | POA: Diagnosis present

## 2017-04-02 DIAGNOSIS — I739 Peripheral vascular disease, unspecified: Secondary | ICD-10-CM | POA: Diagnosis present

## 2017-04-02 DIAGNOSIS — Q211 Atrial septal defect: Secondary | ICD-10-CM | POA: Diagnosis not present

## 2017-04-02 DIAGNOSIS — R109 Unspecified abdominal pain: Secondary | ICD-10-CM | POA: Diagnosis not present

## 2017-04-02 HISTORY — DX: Malignant (primary) neoplasm, unspecified: C80.1

## 2017-04-02 LAB — CBC
HCT: 38.9 % (ref 36.0–46.0)
HEMOGLOBIN: 13 g/dL (ref 12.0–15.0)
MCH: 31.8 pg (ref 26.0–34.0)
MCHC: 33.4 g/dL (ref 30.0–36.0)
MCV: 95.1 fL (ref 78.0–100.0)
Platelets: 233 10*3/uL (ref 150–400)
RBC: 4.09 MIL/uL (ref 3.87–5.11)
RDW: 13.5 % (ref 11.5–15.5)
WBC: 9.9 10*3/uL (ref 4.0–10.5)

## 2017-04-02 LAB — URINALYSIS, MICROSCOPIC (REFLEX)

## 2017-04-02 LAB — URINALYSIS, ROUTINE W REFLEX MICROSCOPIC
BILIRUBIN URINE: NEGATIVE
Glucose, UA: NEGATIVE mg/dL
KETONES UR: NEGATIVE mg/dL
Leukocytes, UA: NEGATIVE
NITRITE: NEGATIVE
PH: 7 (ref 5.0–8.0)
Protein, ur: NEGATIVE mg/dL
SPECIFIC GRAVITY, URINE: 1.016 (ref 1.005–1.030)

## 2017-04-02 LAB — COMPREHENSIVE METABOLIC PANEL
ALBUMIN: 4.7 g/dL (ref 3.5–5.0)
ALK PHOS: 79 U/L (ref 38–126)
ALT: 16 U/L (ref 14–54)
ANION GAP: 10 (ref 5–15)
AST: 27 U/L (ref 15–41)
BILIRUBIN TOTAL: 1.8 mg/dL — AB (ref 0.3–1.2)
BUN: 29 mg/dL — AB (ref 6–20)
CALCIUM: 10.1 mg/dL (ref 8.9–10.3)
CO2: 26 mmol/L (ref 22–32)
Chloride: 102 mmol/L (ref 101–111)
Creatinine, Ser: 0.94 mg/dL (ref 0.44–1.00)
GFR calc Af Amer: >60 mL/min (ref >60)
GFR calc non Af Amer: 53 mL/min — ABNORMAL LOW (ref >60)
GLUCOSE: 130 mg/dL — AB (ref 65–99)
Potassium: 4.2 mmol/L (ref 3.5–5.1)
SODIUM: 138 mmol/L (ref 135–145)
TOTAL PROTEIN: 7.7 g/dL (ref 6.5–8.1)

## 2017-04-02 LAB — TROPONIN I: Troponin I: <0.03 ng/mL (ref <0.03)

## 2017-04-02 LAB — PROTIME-INR
INR: 2.26
PROTHROMBIN TIME: 25.3 s — AB (ref 11.4–15.2)

## 2017-04-02 LAB — LIPASE, BLOOD: Lipase: 36 U/L (ref 11–51)

## 2017-04-02 MED ORDER — IOPAMIDOL (ISOVUE-300) INJECTION 61%
100.0000 mL | Freq: Once | INTRAVENOUS | Status: AC | PRN
  Administered 2017-04-02: 100 mL via INTRAVENOUS

## 2017-04-02 MED ORDER — METOPROLOL TARTRATE 5 MG/5ML IV SOLN
2.5000 mg | Freq: Two times a day (BID) | INTRAVENOUS | Status: DC
  Administered 2017-04-02 – 2017-04-03 (×2): 2.5 mg via INTRAVENOUS
  Filled 2017-04-02 (×2): qty 5

## 2017-04-02 MED ORDER — HYDRALAZINE HCL 20 MG/ML IJ SOLN: 5.0000 mg | INTRAMUSCULAR | Status: DC | PRN

## 2017-04-02 MED ORDER — ONDANSETRON HCL 4 MG PO TABS: 4.0000 mg | ORAL_TABLET | Freq: Four times a day (QID) | ORAL | Status: DC | PRN

## 2017-04-02 MED ORDER — LEVOTHYROXINE SODIUM 100 MCG IV SOLR
50.0000 ug | Freq: Every day | INTRAVENOUS | Status: DC
  Administered 2017-04-03: 50 ug via INTRAVENOUS
  Filled 2017-04-02: qty 5

## 2017-04-02 MED ORDER — LIDOCAINE VISCOUS 2 % MT SOLN
OROMUCOSAL | Status: AC
  Administered 2017-04-02: 15 mL
  Filled 2017-04-02: qty 15

## 2017-04-02 MED ORDER — ONDANSETRON HCL 4 MG/2ML IJ SOLN
4.0000 mg | Freq: Once | INTRAMUSCULAR | Status: AC
  Administered 2017-04-02: 4 mg via INTRAVENOUS
  Filled 2017-04-02: qty 2

## 2017-04-02 MED ORDER — SODIUM CHLORIDE 0.9 % IV BOLUS (SEPSIS)
1000.0000 mL | Freq: Once | INTRAVENOUS | Status: AC
  Administered 2017-04-02: 1000 mL via INTRAVENOUS

## 2017-04-02 MED ORDER — ONDANSETRON HCL 4 MG/2ML IJ SOLN
4.0000 mg | Freq: Four times a day (QID) | INTRAMUSCULAR | Status: DC | PRN
  Administered 2017-04-02: 4 mg via INTRAVENOUS
  Filled 2017-04-02: qty 2

## 2017-04-02 MED ORDER — SODIUM CHLORIDE 0.9% FLUSH
3.0000 mL | Freq: Two times a day (BID) | INTRAVENOUS | Status: DC
  Administered 2017-04-02 – 2017-04-03 (×2): 3 mL via INTRAVENOUS

## 2017-04-02 MED ORDER — QUINAPRIL HCL 20 MG PO TABS: 20.0000 mg | ORAL_TABLET | Freq: Every day | ORAL | 1 refills | Status: DC

## 2017-04-02 MED ORDER — DEXTROSE IN LACTATED RINGERS 5 % IV SOLN
INTRAVENOUS | Status: DC
  Administered 2017-04-02 – 2017-04-03 (×2): via INTRAVENOUS

## 2017-04-02 MED ORDER — PHENOL 1.4 % MT LIQD
1.0000 | OROMUCOSAL | Status: DC | PRN
  Filled 2017-04-02: qty 177

## 2017-04-02 NOTE — Consult Note (Signed)
[facility] Surgery Consult/Admission Note  [name] June 15, 1929  [id number].    Requesting MD: Dr. Cruzita Lederer Chief Complaint/Reason for Consult: partial SBO  HPI:  [name] is a 83 y.o. female with medical history significant of A fib on chronic Coumadin, HTN, hypothyroidism, anxiety, dCHF, previous colon cancer and uterine cancer status post hysterectomy and right hemicolectomy (1994), who presented to the [facility] ED with complains of abdominal pain since last night. Pt states she woke around 2am with abdominal pain. This pain progressively worsened and is now waxing and waning in severity, non radiating. Nothing made it better. Associated abdominal bloating, nausea and vomiting since this morning. Nausea and vomiting has since resolved. Pt states decrease in overall energy for the last week. No other associated symptoms. She denies blood in her vomiting or stools, fever, chills, CP. Pt had 2 normal BM's this morning but has not passed flatus since.  ED Course: VSS, INR of 2.26, bilirubin 1.8.  CT scan of abdomen and pelvis showed partial SBO without identifiable transition point.    ROS:  Review of Systems  Constitutional: Positive for malaise/fatigue. Negative for chills and fever.  Respiratory: Negative for cough and shortness of breath.   Cardiovascular: Negative for chest pain.  Gastrointestinal: Positive for abdominal pain, nausea and vomiting. Negative for blood in stool, constipation, diarrhea and melena.  Genitourinary: Negative for dysuria and hematuria.  Neurological: Negative for dizziness and loss of consciousness.  All other systems reviewed and are negative.    Family History  Problem Relation Age of Onset  . Cancer Mother     deceased age 70  . Heart failure Father     deceased age 62  . Cancer Brother     deceased age 60  . Stroke Sister     and heart problems; deceased age 17  . Heart disease Sister     Past Medical History:   Diagnosis Date  . Anxiety   . Atrial fibrillation ([place])   . Cancer ([place])   . Cataract    REMOVED BILATERAL  . Chest pain, unspecified   . Chronic low back pain   . Diverticulosis of colon   . History of colon cancer   . History of colonic polyps   . History of colonoscopy   . History of uterine cancer   . Hyperlipidemia   . Hypertension   . Hypothyroid   . Impaired glucose tolerance   . Mitral valve disorder   . Osteoarthritis   . Osteoporosis   . Patent foramen ovale   . Peripheral neuropathy   . Peripheral vascular disease ([place])   . Splenic infarction   . UTI (lower urinary tract infection)   . Venous insufficiency   . Vitamin B12 deficiency     Past Surgical History:  Procedure Laterality Date  . ANTERIOR AND POSTERIOR VAGINAL REPAIR  03/2006   Dr.Neal  . basal cell skin cancer Moh's surgery    . CATARACT EXTRACTION, BILATERAL    . COLONOSCOPY  05-25-10   per Dr. Henrene Pastor, benign polyps, repeat in 5 yrs   . EYE SURGERY    . hysterectomy for uterine cancer    . left carpal tunnel surgery  02/2007   Dr. Daylene Katayama  . lumbar laminectomy for spinal stenosis  2006   L3-4  . RETINAL DETACHMENT REPAIR W/ SCLERAL BUCKLE LE Right 03-01-14   per Dr. Zadie Rhine   . right hemicolectomy for colon cancer  1994    Social  History:  reports that she has never smoked. She has never used smokeless tobacco. She reports that she drinks alcohol. She reports that she does not use drugs.  Allergies:  Allergies  Allergen Reactions  . Codeine Nausea Only    REACTION: nausea    Medications Prior to Admission  Medication Sig Dispense Refill  . ALPRAZolam (XANAX) 0.25 MG tablet TAKE 1 TABLET BY MOUTH AT BEDTIME AS NEEDED FOR SLEEP 30 tablet 5  . atorvastatin (LIPITOR) 20 MG tablet TAKE 1 TABLET DAILY 90 tablet 3  . Calcium Carbonate-Vitamin D (CALTRATE 600+D) 600-400 MG-UNIT per tablet Take 1 tablet by mouth daily.      . carvedilol (COREG) 6.25 MG tablet TAKE 1 TABLET TWICE A DAY 180 tablet 3   . estradiol (CLIMARA - DOSED IN MG/24 HR) 0.1 mg/24hr Place 1 patch onto the skin once a week. mondaya    . furosemide (LASIX) 40 MG tablet Take 1 tablet (40 mg total) by mouth daily. 90 tablet 1  . ketorolac (ACULAR) 0.5 % ophthalmic solution Place 1 drop into the right eye 2 (two) times daily.    Marland Kitchen levothyroxine (SYNTHROID, LEVOTHROID) 100 MCG tablet TAKE 1 TABLET DAILY 90 tablet 1  . loperamide (IMODIUM A-D) 2 MG tablet Take 2 mg by mouth 4 (four) times daily as needed for diarrhea or loose stools.    . meclizine (ANTIVERT) 25 MG tablet TAKE 1/2 TO 1 TABLET BY MOUTH EVERY 6 HOURS AS NEEDED FOR DIZZINESS 30 tablet 11  . meloxicam (MOBIC) 7.5 MG tablet TAKE 1 TABLET DAILY 90 tablet 1  . Multiple Vitamins-Minerals (CENTRUM SILVER PO) Take 1 tablet by mouth daily.      . potassium chloride SA (K-DUR,KLOR-CON) 20 MEQ tablet TAKE 1 TABLET TWICE A DAY 180 tablet 2  . spironolactone (ALDACTONE) 25 MG tablet Take 1 tablet (25 mg total) by mouth daily. 90 tablet 1  . vitamin B-12 (CYANOCOBALAMIN) 1000 MCG tablet Take 1,000 mcg by mouth daily.      Marland Kitchen warfarin (COUMADIN) 5 MG tablet TAKE AS DIRECTED BY ANTICOAGULATION CLINIC 100 tablet 1    Blood pressure (!) 158/75, pulse 66, temperature 98 F (36.7 C), temperature source Oral, resp. rate 17, height 5' 11"  (1.803 m), weight 173 lb 11.6 oz (78.8 kg), SpO2 100 %.  Physical Exam  Constitutional: She is oriented to person, place, and time and well-developed, well-nourished, and in no distress. Vital signs are normal. No distress.  Pleasant, well appearing, thin elderly female  HENT:  Head: Normocephalic and atraumatic.  Nose: Nose normal.  Mouth/Throat: Oropharynx is clear and moist. No oropharyngeal exudate.  Eyes: Conjunctivae and EOM are normal. Pupils are equal, round, and reactive to light. Right eye exhibits no discharge. Left eye exhibits no discharge. No scleral icterus.  Neck: Normal range of motion. Neck supple.  Cardiovascular: Normal rate  and intact distal pulses.  An irregular rhythm present. Exam reveals no gallop and no friction rub.   Murmur heard.  Systolic murmur is present  Pulses:      Radial pulses are 2+ on the right side, and 2+ on the left side.  Pulmonary/Chest: Effort normal and breath sounds normal. No respiratory distress. She has no decreased breath sounds. She has no wheezes. She has no rhonchi. She has no rales.  Abdominal: Soft. Bowel sounds are normal. She exhibits distension (mild). She exhibits no mass. There is no hepatosplenomegaly. There is tenderness in the right lower quadrant, suprapubic area and left lower quadrant. There  is no rigidity, no rebound and no guarding.  Musculoskeletal: Normal range of motion. She exhibits no edema or deformity.  Neurological: She is alert and oriented to person, place, and time.  Skin: Skin is warm and dry. No rash noted. She is not diaphoretic.  Psychiatric: Mood and affect normal.  Nursing note and vitals reviewed.   Results for orders placed or performed during the hospital encounter of 04/02/17 (from the past 48 hour(s))  Lipase, blood     Status: None   Collection Time: 04/02/17  9:36 AM  Result Value Ref Range   Lipase 36 11 - 51 U/L  Comprehensive metabolic panel     Status: Abnormal   Collection Time: 04/02/17  9:36 AM  Result Value Ref Range   Sodium 138 135 - 145 mmol/L   Potassium 4.2 3.5 - 5.1 mmol/L   Chloride 102 101 - 111 mmol/L   CO2 26 22 - 32 mmol/L   Glucose, Bld 130 (H) 65 - 99 mg/dL   BUN 29 (H) 6 - 20 mg/dL   Creatinine, Ser 0.94 0.44 - 1.00 mg/dL   Calcium 10.1 8.9 - 10.3 mg/dL   Total Protein 7.7 6.5 - 8.1 g/dL   Albumin 4.7 3.5 - 5.0 g/dL   AST 27 15 - 41 U/L   ALT 16 14 - 54 U/L   Alkaline Phosphatase 79 38 - 126 U/L   Total Bilirubin 1.8 (H) 0.3 - 1.2 mg/dL   GFR calc non Af Amer 53 (L) >60 mL/min   GFR calc Af Amer >60 >60 mL/min    Comment: (NOTE) The eGFR has been calculated using the CKD EPI equation. This calculation  has not been validated in all clinical situations. eGFR's persistently <60 mL/min signify possible Chronic Kidney Disease.    Anion gap 10 5 - 15  CBC     Status: None   Collection Time: 04/02/17  9:36 AM  Result Value Ref Range   WBC 9.9 4.0 - 10.5 K/uL   RBC 4.09 3.87 - 5.11 MIL/uL   Hemoglobin 13.0 12.0 - 15.0 g/dL   HCT 38.9 36.0 - 46.0 %   MCV 95.1 78.0 - 100.0 fL   MCH 31.8 26.0 - 34.0 pg   MCHC 33.4 30.0 - 36.0 g/dL   RDW 13.5 11.5 - 15.5 %   Platelets 233 150 - 400 K/uL  Urinalysis, Routine w reflex microscopic     Status: Abnormal   Collection Time: 04/02/17  9:36 AM  Result Value Ref Range   Color, Urine YELLOW YELLOW   APPearance CLEAR CLEAR   Specific Gravity, Urine 1.016 1.005 - 1.030   pH 7.0 5.0 - 8.0   Glucose, UA NEGATIVE NEGATIVE mg/dL   Hgb urine dipstick TRACE (A) NEGATIVE   Bilirubin Urine NEGATIVE NEGATIVE   Ketones, ur NEGATIVE NEGATIVE mg/dL   Protein, ur NEGATIVE NEGATIVE mg/dL   Nitrite NEGATIVE NEGATIVE   Leukocytes, UA NEGATIVE NEGATIVE  Urinalysis, Microscopic (reflex)     Status: Abnormal   Collection Time: 04/02/17  9:36 AM  Result Value Ref Range   RBC / HPF 6-30 0 - 5 RBC/hpf   WBC, UA 0-5 0 - 5 WBC/hpf   Bacteria, UA RARE (A) NONE SEEN   Squamous Epithelial / LPF 6-30 (A) NONE SEEN   Mucous PRESENT   Troponin I     Status: None   Collection Time: 04/02/17  9:36 AM  Result Value Ref Range   Troponin I <0.03 <0.03 ng/mL  Protime-INR     Status: Abnormal   Collection Time: 04/02/17 10:27 AM  Result Value Ref Range   Prothrombin Time 25.3 (H) 11.4 - 15.2 seconds   INR 2.26    Ct Abdomen Pelvis W Contrast  Result Date: 04/02/2017 CLINICAL DATA:  Abdominal pain, nausea, vomiting EXAM: CT ABDOMEN AND PELVIS WITH CONTRAST TECHNIQUE: Multidetector CT imaging of the abdomen and pelvis was performed using the standard protocol following bolus administration of intravenous contrast. CONTRAST:  135m ISOVUE-300 IOPAMIDOL (ISOVUE-300) INJECTION  61% COMPARISON:  12/15/2015 FINDINGS: Lower chest: Cardiomegaly. Linear scarring in lung bases. No effusions. Hepatobiliary: No focal hepatic abnormality. Gallbladder unremarkable. Pancreas: No focal abnormality or ductal dilatation. Spleen: No focal abnormality.  Normal size. Adrenals/Urinary Tract: No adrenal abnormality. No focal renal abnormality. No stones or hydronephrosis. Urinary bladder is unremarkable. Stomach/Bowel: Changes from prior right hemicolectomy. There are dilated small bowel loops throughout the abdomen and pelvis. Decompressed distal small bowel loops. Findings concerning for partial small bowel obstruction. Exact transition point or cause not visualized. Vascular/Lymphatic: Aortic and iliac calcifications. No aneurysm or adenopathy. Reproductive: Prior hysterectomy.  No adnexal masses. Other: Small amount of free fluid in the pelvis. Musculoskeletal: No acute bony abnormality. IMPRESSION: Dilated small bowel loops in the abdomen and pelvis with decompressed distal small bowel loops. Findings compatible with partial small bowel obstruction. Exact cause and transition point not visualized. Cardiomegaly. Aorta iliac atherosclerosis. Electronically Signed   By: KRolm BaptiseM.D.   On: 04/02/2017 11:35      Assessment/Plan Partial SBO - very little NG tube output and good bowel sounds, no flatus since this morning, 2 normal BM's this Am - will wait until tomorrow morning to see if she has flatus and evaluate her NG tube out put before initiating  the SBO protocol - likely this is an ileus or enteritis  - we will continue to follow this pt. Thank you for the consult.   JKalman Drape PChildren'S Mercy HospitalSurgery 04/02/2017, 3:06 PM Pager: [phone]: [phone]-Fri 7:00 am-4:30 pm Sat-Sun 7:00 am-11:30 am

## 2017-04-02 NOTE — ED Notes (Signed)
Attempted to call report to the floor. RN Unavailable at this time.

## 2017-04-02 NOTE — ED Provider Notes (Signed)
[facility] DEPT [facility] Provider Note   CSN: [id number] Arrival date & time: 04/02/17  0850     History   Chief Complaint Chief Complaint  Patient presents with  . Abdominal Pain  . Emesis    HPI [name] is a 83 y.o. female history of A. fib on Coumadin, previous colon cancer and uterine cancer status post hysterectomy and colectomy here presenting with abdominal pain, distention. Has been nauseated for the last several days. Since last night, she has been having severe abdominal pain that is crampy and diffuse. Patient states that her abdomen is more distended as well. She did have 2 normal bowel movements this morning and is still passing gas. She had one episode of vomiting this morning which prompted her to come to the ER. Denies any fevers or chills or urinary symptoms. Of note, patient was admitted last year for pneumonia and was admitted previously for sepsis from urinary tract infection. She states that she has no history of bowel obstructions.   The history is provided by the patient.    Past Medical History:  Diagnosis Date  . Anxiety   . Atrial fibrillation ([place])   . Cancer ([place])   . Cataract    REMOVED BILATERAL  . Chest pain, unspecified   . Chronic low back pain   . Diverticulosis of colon   . History of colon cancer   . History of colonic polyps   . History of colonoscopy   . History of uterine cancer   . Hyperlipidemia   . Hypertension   . Hypothyroid   . Impaired glucose tolerance   . Mitral valve disorder   . Osteoarthritis   . Osteoporosis   . Patent foramen ovale   . Peripheral neuropathy   . Peripheral vascular disease ([place])   . Splenic infarction   . UTI (lower urinary tract infection)   . Venous insufficiency   . Vitamin B12 deficiency     Patient Active Problem List   Diagnosis Date Noted  . SBO (small bowel obstruction) ([place]) 04/02/2017  . CAP (community acquired pneumonia) 02/24/2016  . Fever blister 02/24/2016  . Bilateral  pneumonia 02/17/2016  . Acute respiratory failure with hypercapnia ([place]) 02/17/2016  . Acute diastolic heart failure ([place])   . Dehydration 12/15/2015  . Sepsis ([place]) 12/15/2015  . Enteritis 12/15/2015  . Diarrhea   . Charcot's joint of left foot 11/24/2015  . Vasculitis ([place]) 03/11/2015  . Aortic stenosis 09/14/2013  . Long term current use of anticoagulant 01/09/2011  . COLONIC POLYPS 10/29/2010  . DIVERTICULOSIS OF COLON 10/29/2010  . ABNORMAL CV (STRESS) TEST 04/20/2010  . CHEST PAIN UNSPECIFIED 04/17/2010  . VITAMIN B12 DEFICIENCY 12/23/2008  . IMPAIRED GLUCOSE TOLERANCE 12/23/2008  . PERIPHERAL NEUROPATHY 06/22/2008  . SPLENIC INFARCTION 02/20/2008  . Anxiety state 02/20/2008  . Moderate mitral regurgitation 02/20/2008  . PERIPHERAL VASCULAR DISEASE 02/20/2008  . Venous (peripheral) insufficiency 02/20/2008  . URINARY TRACT INFECTION 02/20/2008  . Osteoarthritis 02/20/2008  . OSTEOPOROSIS 02/20/2008  . UTERINE CANCER, HX OF 02/20/2008  . Malignant neoplasm of colon ([place]) 01/13/2008  . Hypothyroidism 01/13/2008  . Hyperlipemia 01/13/2008  . Essential hypertension 01/13/2008  . ATRIAL FIBRILLATION 01/13/2008  . LOW BACK PAIN, CHRONIC 01/13/2008    Past Surgical History:  Procedure Laterality Date  . ANTERIOR AND POSTERIOR VAGINAL REPAIR  03/2006   Dr.Neal  . basal cell skin cancer Moh's surgery    . CATARACT EXTRACTION, BILATERAL    . COLONOSCOPY  05-25-10  per Dr. Henrene Pastor, benign polyps, repeat in 5 yrs   . EYE SURGERY    . hysterectomy for uterine cancer    . left carpal tunnel surgery  02/2007   Dr. Daylene Katayama  . lumbar laminectomy for spinal stenosis  2006   L3-4  . RETINAL DETACHMENT REPAIR W/ SCLERAL BUCKLE LE Right 03-01-14   per Dr. Zadie Rhine   . right hemicolectomy for colon cancer  1994    OB History    No data available       Home Medications    Prior to Admission medications   Medication Sig Start Date End Date Taking? Authorizing Provider  ALPRAZolam  Duanne Moron) 0.25 MG tablet TAKE 1 TABLET BY MOUTH AT BEDTIME AS NEEDED FOR SLEEP 11/08/16   Laurey Morale, MD  atorvastatin (LIPITOR) 20 MG tablet TAKE 1 TABLET DAILY 06/12/16   Josue Hector, MD  Calcium Carbonate-Vitamin D (CALTRATE 600+D) 600-400 MG-UNIT per tablet Take 1 tablet by mouth daily.      [provider]  carvedilol (COREG) 6.25 MG tablet TAKE 1 TABLET TWICE A DAY 05/23/16   Josue Hector, MD  estradiol (CLIMARA - DOSED IN MG/24 HR) 0.1 mg/24hr Place 1 patch onto the skin once a week. AJOINOM    [provider]  furosemide (LASIX) 40 MG tablet Take 1 tablet (40 mg total) by mouth daily. 02/25/17   Josue Hector, MD  ketorolac (ACULAR) 0.5 % ophthalmic solution Place 1 drop into the right eye 2 (two) times daily. 03/17/15   [provider]  levothyroxine (SYNTHROID, LEVOTHROID) 100 MCG tablet TAKE 1 TABLET DAILY 02/25/17   Laurey Morale, MD  loperamide (IMODIUM A-D) 2 MG tablet Take 2 mg by mouth 4 (four) times daily as needed for diarrhea or loose stools.    [provider]  meclizine (ANTIVERT) 25 MG tablet TAKE 1/2 TO 1 TABLET BY MOUTH EVERY 6 HOURS AS NEEDED FOR DIZZINESS 12/19/16   Laurey Morale, MD  meloxicam (MOBIC) 7.5 MG tablet TAKE 1 TABLET DAILY 02/25/17   Laurey Morale, MD  Multiple Vitamins-Minerals (CENTRUM SILVER PO) Take 1 tablet by mouth daily.      [provider]  potassium chloride SA (K-DUR,KLOR-CON) 20 MEQ tablet TAKE 1 TABLET TWICE A DAY 09/11/16   Josue Hector, MD  quinapril (ACCUPRIL) 20 MG tablet Take 1 tablet (20 mg total) by mouth daily. 12/06/16   Josue Hector, MD  spironolactone (ALDACTONE) 25 MG tablet Take 1 tablet (25 mg total) by mouth daily. 10/24/16   Josue Hector, MD  vitamin B-12 (CYANOCOBALAMIN) 1000 MCG tablet Take 1,000 mcg by mouth daily.      [provider]  warfarin (COUMADIN) 5 MG tablet TAKE AS DIRECTED BY ANTICOAGULATION CLINIC 02/11/17   Josue Hector, MD    Family  History Family History  Problem Relation Age of Onset  . Cancer Mother     deceased age 83  . Heart failure Father     deceased age 52  . Cancer Brother     deceased age 57  . Stroke Sister     and heart problems; deceased age 79  . Heart disease Sister     Social History Social History  Substance Use Topics  . Smoking status: Never Smoker  . Smokeless tobacco: Never Used  . Alcohol use 0.0 oz/week     Comment: occ     Allergies   Codeine   Review of Systems  Review of Systems  Gastrointestinal: Positive for abdominal pain and vomiting.  All other systems reviewed and are negative.    Physical Exam Updated Vital Signs BP (!) 141/79   Pulse 64   Temp 97.7 F (36.5 C) (Oral)   Resp 13   Ht 5\' 11"  (1.803 m)   Wt 176 lb (79.8 kg)   SpO2 100%   BMI 24.55 kg/m   Physical Exam  Constitutional: She is oriented to person, place, and time.  Uncomfortable, chronically ill   HENT:  Head: Normocephalic.  MM slightly dry   Eyes: EOM are normal. Pupils are equal, round, and reactive to light.  Neck: Normal range of motion.  Cardiovascular: Normal rate, regular rhythm and normal heart sounds.   Pulmonary/Chest: Effort normal and breath sounds normal. No respiratory distress. She has no wheezes. She has no rales.  Abdominal: Soft. Bowel sounds are normal.  Slightly distended, + BS, + diffuse tenderness, no rebound   Musculoskeletal: Normal range of motion.  Neurological: She is alert and oriented to person, place, and time.  Skin: Skin is warm.  Psychiatric: She has a normal mood and affect.  Nursing note and vitals reviewed.    ED Treatments / Results  Labs (all labs ordered are listed, but only abnormal results are displayed) Labs Reviewed  COMPREHENSIVE METABOLIC PANEL - Abnormal; Notable for the following:       Result Value   Glucose, Bld 130 (*)    BUN 29 (*)    Total Bilirubin 1.8 (*)    GFR calc non Af Amer 53 (*)    All other components within  normal limits  URINALYSIS, ROUTINE W REFLEX MICROSCOPIC - Abnormal; Notable for the following:    Hgb urine dipstick TRACE (*)    All other components within normal limits  URINALYSIS, MICROSCOPIC (REFLEX) - Abnormal; Notable for the following:    Bacteria, UA RARE (*)    Squamous Epithelial / LPF 6-30 (*)    All other components within normal limits  PROTIME-INR - Abnormal; Notable for the following:    Prothrombin Time 25.3 (*)    All other components within normal limits  LIPASE, BLOOD  CBC  TROPONIN I    EKG  EKG Interpretation  Date/Time:  Tuesday Apr 02 2017 10:33:36 EDT Ventricular Rate:  54 PR Interval:    QRS Duration: 105 QT Interval:  397 QTC Calculation: 377 R Axis:   90 Text Interpretation:  Atrial fibrillation Borderline right axis deviation Borderline repolarization abnormality No significant change since last tracing Confirmed by YAO  MD, DAVID ([id number]) on 04/02/2017 10:38:21 AM Also confirmed by Darl Householder  MD, DAVID ([id number]), editor Drema Pry [phone])  on 04/02/2017 11:35:58 AM       Radiology Ct Abdomen Pelvis W Contrast  Result Date: 04/02/2017 CLINICAL DATA:  Abdominal pain, nausea, vomiting EXAM: CT ABDOMEN AND PELVIS WITH CONTRAST TECHNIQUE: Multidetector CT imaging of the abdomen and pelvis was performed using the standard protocol following bolus administration of intravenous contrast. CONTRAST:  174mL ISOVUE-300 IOPAMIDOL (ISOVUE-300) INJECTION 61% COMPARISON:  12/15/2015 FINDINGS: Lower chest: Cardiomegaly. Linear scarring in lung bases. No effusions. Hepatobiliary: No focal hepatic abnormality. Gallbladder unremarkable. Pancreas: No focal abnormality or ductal dilatation. Spleen: No focal abnormality.  Normal size. Adrenals/Urinary Tract: No adrenal abnormality. No focal renal abnormality. No stones or hydronephrosis. Urinary bladder is unremarkable. Stomach/Bowel: Changes from prior right hemicolectomy. There are dilated small bowel loops throughout the  abdomen and pelvis. Decompressed distal small bowel loops. Findings concerning  for partial small bowel obstruction. Exact transition point or cause not visualized. Vascular/Lymphatic: Aortic and iliac calcifications. No aneurysm or adenopathy. Reproductive: Prior hysterectomy.  No adnexal masses. Other: Small amount of free fluid in the pelvis. Musculoskeletal: No acute bony abnormality. IMPRESSION: Dilated small bowel loops in the abdomen and pelvis with decompressed distal small bowel loops. Findings compatible with partial small bowel obstruction. Exact cause and transition point not visualized. Cardiomegaly. Aorta iliac atherosclerosis. Electronically Signed   By: Rolm Baptise M.D.   On: 04/02/2017 11:35    Procedures Procedures (including critical care time)  Medications Ordered in ED Medications  lidocaine (XYLOCAINE) 2 % viscous mouth solution (not administered)  sodium chloride 0.9 % bolus 1,000 mL (0 mLs Intravenous Stopped 04/02/17 1109)  ondansetron (ZOFRAN) injection 4 mg (4 mg Intravenous Given 04/02/17 0939)  iopamidol (ISOVUE-300) 61 % injection 100 mL (100 mLs Intravenous Contrast Given 04/02/17 1115)     Initial Impression / Assessment and Plan / ED Course  I have reviewed the triage vital signs and the nursing notes.  Pertinent labs & imaging results that were available during my care of the patient were reviewed by me and considered in my medical decision making (see chart for details).    INELL MIMBS is a 83 y.o. female here with abdominal pain, distention. Hx of uterine and colon cancer s/p multiple surgeries. Consider recurrent cancer vs SBO. Will get labs, UA, CT ab/pel.   12:13 PM Labs unremarkable. CT showed partial SBO. Called Dr. Reece Agar from surgery to follow patient. Dr. Renne Crigler to admit to [facility].   Final Clinical Impressions(s) / ED Diagnoses   Final diagnoses:  None    New Prescriptions New Prescriptions   No medications on file     Drenda Freeze, MD 04/02/17 1214

## 2017-04-02 NOTE — ED Triage Notes (Signed)
Generalized abdominal pain since 1 am.  Last bm last night, normal.  Pt started vomiting around 6 am.  3 occurrences.  Pt denies dysuria.  No fever.

## 2017-04-02 NOTE — H&P (Signed)
History and Physical    [name]:[id number] DOB: 1929-10-29 DOA: 04/02/2017  I have briefly reviewed the patient's prior medical records in [facility]  PCP: Laurey Morale, MD  Patient coming from: home  Chief Complaint: abdominal pain  HPI: [name] is a 83 y.o. female with medical history significant of A fib on chronic Coumadin, HTN, hypothyroidism, anxiety, dCHF, previous colon cancer and uterine cancer status post hysterectomy and colectomy, presents to the ED with complains of abdominal pain since last night. She has also been complaining of nausea and vomiting this morning.  She was fine last night, went to bed, and woke up around 1 AM with abdominal pain.  She difficulties falling back asleep due to persistent abdominal pain, and early morning hours she started having nausea as well as vomiting.  She denies any fever or chills.  She denies any chest pain shortness of breath.  She denies any palpitations.  Her last bowel movement was this morning.  She has not had a bowel movement or passed gas since.  ED Course: In the ED vitals are stable, she is afebrile, satting well on room air. Labs are pertinent for INR of 2.26 and a slightly elevated bilirubin of 1.8. CT scan of abdomen and pelvis showed partial SBO without identifiable transition point. General surgery consulted and TRH asked to admit.   Review of Systems: As per HPI otherwise 10 point review of systems negative.   Past Medical History:  Diagnosis Date  . Anxiety   . Atrial fibrillation ([place])   . Cancer ([place])   . Cataract    REMOVED BILATERAL  . Chest pain, unspecified   . Chronic low back pain   . Diverticulosis of colon   . History of colon cancer   . History of colonic polyps   . History of colonoscopy   . History of uterine cancer   . Hyperlipidemia   . Hypertension   . Hypothyroid   . Impaired glucose tolerance   . Mitral valve disorder   . Osteoarthritis   . Osteoporosis   . Patent  foramen ovale   . Peripheral neuropathy   . Peripheral vascular disease ([place])   . Splenic infarction   . UTI (lower urinary tract infection)   . Venous insufficiency   . Vitamin B12 deficiency     Past Surgical History:  Procedure Laterality Date  . ANTERIOR AND POSTERIOR VAGINAL REPAIR  03/2006   Dr.Neal  . basal cell skin cancer Moh's surgery    . CATARACT EXTRACTION, BILATERAL    . COLONOSCOPY  05-25-10   per Dr. Henrene Pastor, benign polyps, repeat in 5 yrs   . EYE SURGERY    . hysterectomy for uterine cancer    . left carpal tunnel surgery  02/2007   Dr. Daylene Katayama  . lumbar laminectomy for spinal stenosis  2006   L3-4  . RETINAL DETACHMENT REPAIR W/ SCLERAL BUCKLE LE Right 03-01-14   per Dr. Zadie Rhine   . right hemicolectomy for colon cancer  1994     reports that she has never smoked. She has never used smokeless tobacco. She reports that she drinks alcohol. She reports that she does not use drugs.  Allergies  Allergen Reactions  . Codeine Nausea Only    REACTION: nausea    Family History  Problem Relation Age of Onset  . Cancer Mother     deceased age 56  . Heart failure Father     deceased  age 52  . Cancer Brother     deceased age 26  . Stroke Sister     and heart problems; deceased age 65  . Heart disease Sister     Prior to Admission medications   Medication Sig Start Date End Date Taking? Authorizing Provider  ALPRAZolam Duanne Moron) 0.25 MG tablet TAKE 1 TABLET BY MOUTH AT BEDTIME AS NEEDED FOR SLEEP 11/08/16   Laurey Morale, MD  atorvastatin (LIPITOR) 20 MG tablet TAKE 1 TABLET DAILY 06/12/16   Josue Hector, MD  Calcium Carbonate-Vitamin D (CALTRATE 600+D) 600-400 MG-UNIT per tablet Take 1 tablet by mouth daily.      [provider]  carvedilol (COREG) 6.25 MG tablet TAKE 1 TABLET TWICE A DAY 05/23/16   Josue Hector, MD  estradiol (CLIMARA - DOSED IN MG/24 HR) 0.1 mg/24hr Place 1 patch onto the skin once a week. [id number]    [provider]    furosemide (LASIX) 40 MG tablet Take 1 tablet (40 mg total) by mouth daily. 02/25/17   Josue Hector, MD  ketorolac (ACULAR) 0.5 % ophthalmic solution Place 1 drop into the right eye 2 (two) times daily. 03/17/15   [provider]  levothyroxine (SYNTHROID, LEVOTHROID) 100 MCG tablet TAKE 1 TABLET DAILY 02/25/17   Laurey Morale, MD  loperamide (IMODIUM A-D) 2 MG tablet Take 2 mg by mouth 4 (four) times daily as needed for diarrhea or loose stools.    [provider]  meclizine (ANTIVERT) 25 MG tablet TAKE 1/2 TO 1 TABLET BY MOUTH EVERY 6 HOURS AS NEEDED FOR DIZZINESS 12/19/16   Laurey Morale, MD  meloxicam (MOBIC) 7.5 MG tablet TAKE 1 TABLET DAILY 02/25/17   Laurey Morale, MD  Multiple Vitamins-Minerals (CENTRUM SILVER PO) Take 1 tablet by mouth daily.      [provider]  potassium chloride SA (K-DUR,KLOR-CON) 20 MEQ tablet TAKE 1 TABLET TWICE A DAY 09/11/16   Josue Hector, MD  quinapril (ACCUPRIL) 20 MG tablet Take 1 tablet (20 mg total) by mouth daily. 12/06/16   Josue Hector, MD  spironolactone (ALDACTONE) 25 MG tablet Take 1 tablet (25 mg total) by mouth daily. 10/24/16   Josue Hector, MD  vitamin B-12 (CYANOCOBALAMIN) 1000 MCG tablet Take 1,000 mcg by mouth daily.      [provider]  warfarin (COUMADIN) 5 MG tablet TAKE AS DIRECTED BY ANTICOAGULATION CLINIC 02/11/17   Josue Hector, MD    Physical Exam: Vitals:   04/02/17 0912 04/02/17 1100  BP: 130/80 (!) 141/79  Pulse: 77 64  Resp: 16 13  Temp: 97.7 F (36.5 C)   TempSrc: Oral   SpO2: 99% 100%  Weight: 79.8 kg (176 lb)   Height: 5\' 11"  (1.803 m)    Constitutional: NAD, calm, comfortable, NG tube in place Eyes: PERRL, lids and conjunctivae normal ENMT: Mucous membranes are moist. Posterior pharynx clear of any exudate or lesions.Normal dentition.  Neck: normal, supple, no masses, no thyromegaly Respiratory: clear to auscultation bilaterally, no wheezing, no crackles.   Cardiovascular: irragular rhythm, 3/6 SEM. No extremity edema.  Abdomen: no tenderness, no masses palpated. Bowel sounds positive.  Musculoskeletal: no clubbing / cyanosis. Normal muscle tone.  Skin: no rashes, lesions, ulcers. No induration Neurologic: CN 2-12 grossly intact. Strength 5/5 in all 4.  Psychiatric: Normal judgment and insight. Alert and oriented x 3. Normal mood.   Labs on Admission: I have personally reviewed following labs and imaging studies  CBC:  Recent Labs Lab 04/02/17 0936  WBC 9.9  HGB 13.0  HCT 38.9  MCV 95.1  PLT 509   Basic Metabolic Panel:  Recent Labs Lab 04/02/17 0936  NA 138  K 4.2  CL 102  CO2 26  GLUCOSE 130*  BUN 29*  CREATININE 0.94  CALCIUM 10.1   GFR: Estimated Creatinine Clearance: 48 mL/min (by C-G formula based on SCr of 0.94 mg/dL). Liver Function Tests:  Recent Labs Lab 04/02/17 0936  AST 27  ALT 16  ALKPHOS 79  BILITOT 1.8*  PROT 7.7  ALBUMIN 4.7    Recent Labs Lab 04/02/17 0936  LIPASE 36   No results for input(s): AMMONIA in the last 168 hours. Coagulation Profile:  Recent Labs Lab 04/02/17 1027  INR 2.26   Cardiac Enzymes:  Recent Labs Lab 04/02/17 0936  TROPONINI <0.03   BNP (last 3 results) No results for input(s): PROBNP in the last 8760 hours. HbA1C: No results for input(s): HGBA1C in the last 72 hours. CBG: No results for input(s): GLUCAP in the last 168 hours. Lipid Profile: No results for input(s): CHOL, HDL, LDLCALC, TRIG, CHOLHDL, LDLDIRECT in the last 72 hours. Thyroid Function Tests: No results for input(s): TSH, T4TOTAL, FREET4, T3FREE, THYROIDAB in the last 72 hours. Anemia Panel: No results for input(s): VITAMINB12, FOLATE, FERRITIN, TIBC, IRON, RETICCTPCT in the last 72 hours. Urine analysis:    Component Value Date/Time   COLORURINE YELLOW 04/02/2017 0936   APPEARANCEUR CLEAR 04/02/2017 0936   LABSPEC 1.016 04/02/2017 0936   PHURINE 7.0 04/02/2017 0936   GLUCOSEU  NEGATIVE 04/02/2017 0936   GLUCOSEU NEGATIVE 07/15/2008 1258   HGBUR TRACE (A) 04/02/2017 0936   BILIRUBINUR NEGATIVE 04/02/2017 0936   BILIRUBINUR neg 08/09/2016 1056   KETONESUR NEGATIVE 04/02/2017 0936   PROTEINUR NEGATIVE 04/02/2017 0936   UROBILINOGEN 0.2 08/09/2016 1056   UROBILINOGEN 0.2 09/12/2014 1810   NITRITE NEGATIVE 04/02/2017 0936   LEUKOCYTESUR NEGATIVE 04/02/2017 0936     Radiological Exams on Admission: Ct Abdomen Pelvis W Contrast  Result Date: 04/02/2017 CLINICAL DATA:  Abdominal pain, nausea, vomiting EXAM: CT ABDOMEN AND PELVIS WITH CONTRAST TECHNIQUE: Multidetector CT imaging of the abdomen and pelvis was performed using the standard protocol following bolus administration of intravenous contrast. CONTRAST:  158mL ISOVUE-300 IOPAMIDOL (ISOVUE-300) INJECTION 61% COMPARISON:  12/15/2015 FINDINGS: Lower chest: Cardiomegaly. Linear scarring in lung bases. No effusions. Hepatobiliary: No focal hepatic abnormality. Gallbladder unremarkable. Pancreas: No focal abnormality or ductal dilatation. Spleen: No focal abnormality.  Normal size. Adrenals/Urinary Tract: No adrenal abnormality. No focal renal abnormality. No stones or hydronephrosis. Urinary bladder is unremarkable. Stomach/Bowel: Changes from prior right hemicolectomy. There are dilated small bowel loops throughout the abdomen and pelvis. Decompressed distal small bowel loops. Findings concerning for partial small bowel obstruction. Exact transition point or cause not visualized. Vascular/Lymphatic: Aortic and iliac calcifications. No aneurysm or adenopathy. Reproductive: Prior hysterectomy.  No adnexal masses. Other: Small amount of free fluid in the pelvis. Musculoskeletal: No acute bony abnormality. IMPRESSION: Dilated small bowel loops in the abdomen and pelvis with decompressed distal small bowel loops. Findings compatible with partial small bowel obstruction. Exact cause and transition point not visualized. Cardiomegaly.  Aorta iliac atherosclerosis. Electronically Signed   By: Rolm Baptise M.D.   On: 04/02/2017 11:35    EKG: Independently reviewed. Rate controlled A fib  Assessment/Plan Active Problems:   Hypothyroidism   Hyperlipemia   Essential hypertension   ATRIAL FIBRILLATION   Long term current use of anticoagulant  SBO (small bowel obstruction) (HCC)    Partial SBO -general surgery consulted, appreciate input -conservative management with NG tube, NPO, IV fluids  Permanent A fib -score > 2, on Coumadin, INR 2.26 on admission. Will repeat in am, if < 2 will start heparin infusion -rate controlled, monitor on telemetry for now -hold home Coreg, place on scheduled IV metoprolol  Hypothyroidism -IV synthroid half of oral dose  HTN -hold Accupril, Lasix, Spirinolactone, Coreg -hydralazine PRN  HLD -hold Atorvastatin   DVT prophylaxis: On Coumadin  Code Status: DNR  Family Communication: no family at bedside Disposition Plan: admit to telemetry Consults called: General surgery     Admission status: Inpatient    At the time of admission, it appears that the appropriate admission status for this patient is INPATIENT. This is judged to be reasonable and necessary in order to provide the required high service intensity to ensure the patient's safety given the presenting symptoms, physical exam findings, and initial radiographic and laboratory data in the context of their chronic comorbidities. Current circumstances are SBO, and it is felt to place patient at high risk for further clinical deterioration threatening life, limb, or organ. Moreover, it is my clinical judgment that the patient will require inpatient hospital care spanning beyond 2 midnights from the point of admission and that early discharge would result in unnecessary risk of decompensation and readmission or threat to life, limb or bodily function.   Marzetta Board, MD [facility] Pager [phone]  If  7PM-7AM, please contact night-coverage [email or web address] Password [email or web address]  04/02/2017, 12:07 PM

## 2017-04-03 ENCOUNTER — Encounter (HOSPITAL_COMMUNITY): Payer: Self-pay

## 2017-04-03 LAB — COMPREHENSIVE METABOLIC PANEL
ALT: 14 U/L (ref 14–54)
AST: 25 U/L (ref 15–41)
Albumin: 4.1 g/dL (ref 3.5–5.0)
Alkaline Phosphatase: 67 U/L (ref 38–126)
Anion gap: 8 (ref 5–15)
BILIRUBIN TOTAL: 1.5 mg/dL — AB (ref 0.3–1.2)
BUN: 16 mg/dL (ref 6–20)
CO2: 24 mmol/L (ref 22–32)
Calcium: 9.1 mg/dL (ref 8.9–10.3)
Chloride: 108 mmol/L (ref 101–111)
Creatinine, Ser: 0.87 mg/dL (ref 0.44–1.00)
GFR calc Af Amer: >60 mL/min (ref >60)
GFR, EST NON AFRICAN AMERICAN: 59 mL/min — AB (ref >60)
Glucose, Bld: 129 mg/dL — ABNORMAL HIGH (ref 65–99)
POTASSIUM: 4 mmol/L (ref 3.5–5.1)
Sodium: 140 mmol/L (ref 135–145)
TOTAL PROTEIN: 6.9 g/dL (ref 6.5–8.1)

## 2017-04-03 LAB — CBC
HEMATOCRIT: 40.6 % (ref 36.0–46.0)
Hemoglobin: 12.7 g/dL (ref 12.0–15.0)
MCH: 30 pg (ref 26.0–34.0)
MCHC: 31.3 g/dL (ref 30.0–36.0)
MCV: 95.8 fL (ref 78.0–100.0)
PLATELETS: 246 10*3/uL (ref 150–400)
RBC: 4.24 MIL/uL (ref 3.87–5.11)
RDW: 13.5 % (ref 11.5–15.5)
WBC: 8.5 10*3/uL (ref 4.0–10.5)

## 2017-04-03 LAB — TSH: TSH: 0.266 u[IU]/mL — ABNORMAL LOW (ref 0.350–4.500)

## 2017-04-03 LAB — PROTIME-INR
INR: 2.27
PROTHROMBIN TIME: 25.4 s — AB (ref 11.4–15.2)

## 2017-04-03 MED ORDER — ESTRADIOL 0.1 MG/24HR TD PTWK: 0.1000 mg | MEDICATED_PATCH | TRANSDERMAL | Status: DC

## 2017-04-03 MED ORDER — LEVOTHYROXINE SODIUM 100 MCG PO TABS
100.0000 ug | ORAL_TABLET | Freq: Every day | ORAL | Status: DC
  Administered 2017-04-04: 100 ug via ORAL
  Filled 2017-04-03: qty 1

## 2017-04-03 MED ORDER — KETOROLAC TROMETHAMINE 0.5 % OP SOLN
1.0000 [drp] | Freq: Two times a day (BID) | OPHTHALMIC | Status: DC
  Administered 2017-04-03 – 2017-04-04 (×3): 1 [drp] via OPHTHALMIC
  Filled 2017-04-03: qty 3

## 2017-04-03 MED ORDER — FUROSEMIDE 40 MG PO TABS
40.0000 mg | ORAL_TABLET | Freq: Every day | ORAL | Status: DC
  Administered 2017-04-03 – 2017-04-04 (×2): 40 mg via ORAL
  Filled 2017-04-03 (×2): qty 1

## 2017-04-03 MED ORDER — ALPRAZOLAM 0.25 MG PO TABS
0.2500 mg | ORAL_TABLET | Freq: Once | ORAL | Status: AC
  Administered 2017-04-03: 0.25 mg via ORAL
  Filled 2017-04-03: qty 1

## 2017-04-03 MED ORDER — WARFARIN SODIUM 5 MG PO TABS
5.0000 mg | ORAL_TABLET | Freq: Once | ORAL | Status: AC
  Administered 2017-04-03: 5 mg via ORAL
  Filled 2017-04-03: qty 1

## 2017-04-03 MED ORDER — MELOXICAM 7.5 MG PO TABS
7.5000 mg | ORAL_TABLET | Freq: Every day | ORAL | Status: DC
  Administered 2017-04-03 – 2017-04-04 (×2): 7.5 mg via ORAL
  Filled 2017-04-03 (×3): qty 1

## 2017-04-03 MED ORDER — LISINOPRIL 20 MG PO TABS
20.0000 mg | ORAL_TABLET | Freq: Every day | ORAL | Status: DC
  Administered 2017-04-03 – 2017-04-04 (×2): 20 mg via ORAL
  Filled 2017-04-03 (×2): qty 1

## 2017-04-03 MED ORDER — ALPRAZOLAM 0.25 MG PO TABS: 0.2500 mg | ORAL_TABLET | Freq: Every evening | ORAL | Status: DC | PRN

## 2017-04-03 MED ORDER — WARFARIN - PHARMACIST DOSING INPATIENT: Freq: Every day | Status: DC

## 2017-04-03 MED ORDER — SPIRONOLACTONE 25 MG PO TABS
25.0000 mg | ORAL_TABLET | Freq: Every day | ORAL | Status: DC
  Administered 2017-04-03 – 2017-04-04 (×2): 25 mg via ORAL
  Filled 2017-04-03 (×2): qty 1

## 2017-04-03 MED ORDER — QUINAPRIL HCL 10 MG PO TABS: 20.0000 mg | ORAL_TABLET | Freq: Every day | ORAL | Status: DC

## 2017-04-03 MED ORDER — LEVOTHYROXINE SODIUM 100 MCG PO TABS: 100.0000 ug | ORAL_TABLET | Freq: Every day | ORAL | Status: DC

## 2017-04-03 MED ORDER — CARVEDILOL 6.25 MG PO TABS
6.2500 mg | ORAL_TABLET | Freq: Two times a day (BID) | ORAL | Status: DC
  Administered 2017-04-03 – 2017-04-04 (×2): 6.25 mg via ORAL
  Filled 2017-04-03 (×2): qty 1

## 2017-04-03 NOTE — Progress Notes (Signed)
PROGRESS NOTE    [name]  JJO:[id number] DOB: February 23, 1929 DOA: 04/02/2017 PCP: Laurey Morale, MD  Outpatient Specialists:  [facility]   58 Known history of right hemicolectomy in 1994 with surveillance colonoscopy last done 04/2010-8 mm adenoma Atrial fibrillation on Coumadin PFO History of splenic infarct History uterine cancer grade 1 stage I in 1991  Status post anterior and posterior colporrhaphy and suspension 03/2006 On chronic estrogen since 1994  Admitted with small bowel obstruction 04/02/17 Past 2 stools subsequently-NG tube was removed on 5/9    Assessment & Plan:   Active Problems:   Hypothyroidism   Hyperlipemia   Essential hypertension   ATRIAL FIBRILLATION   Long term current use of anticoagulant   SBO (small bowel obstruction) (HCC)   Small bowel obstruction CT scan showed partial SBO without transition point. Has passed 2 stools since admission. Graduated by general surgery to liquid diet Expect will be able to advance to regular diet head by a.m. 5/10 but we'll monitor for recurrence  Atrial fibrillation, [place] score >4 on Coumadin Some issues with rate control 5/8 PM as well as 5/9 AM Reimplement Coreg at home dose 6.25 twice a day  Diastolic heart failure Continue Coreg 6.25 twice a day, lisinopril 20 daily, Aldactone 25 daily Outpatient follow-up with her cardiologist  Postmenopausal On [place] 0.1 every week. Needs outpatient discussion about risks and benefits with her primary physician     Coumadin Inpatient Tele D/w niece  20 min   Consultants:   none  Procedures:    none  Antimicrobials:   none    Subjective: Well alert pleasabnt in nad No cp Some issues c Rate control overnight  Objective: Vitals:   04/02/17 1230 04/02/17 1355 04/02/17 2141 04/03/17 0451  BP: (!) 146/79 (!) 158/75 (!) 147/59 115/81  Pulse: 69 66 80 (!) 102  Resp: 19 17 18 18   Temp:  98 F (36.7 C) 97.7 F (36.5 C) 98 F  (36.7 C)  TempSrc:  Oral Oral Oral  SpO2: 98% 100% 95% 92%  Weight:  78.8 kg (173 lb 11.6 oz)    Height:  5\' 11"  (1.803 m)      Intake/Output Summary (Last 24 hours) at 04/03/17 1250 Last data filed at 04/03/17 0700  Gross per 24 hour  Intake          1111.25 ml  Output              100 ml  Net          1011.25 ml   Filed Weights   04/02/17 0912 04/02/17 1355  Weight: 79.8 kg (176 lb) 78.8 kg (173 lb 11.6 oz)    Examination:  Calm no ict/pallor eomi mallmapti1 No thyromegally s1 s2 Irrreg irreg Clear chest no wheeze mood & affect appropriate.  abd soft-not distended-no rebound No le edema    Data Reviewed: I have personally reviewed following labs and imaging studies  CBC:  Recent Labs Lab 04/02/17 0936 04/03/17 0524  WBC 9.9 8.5  HGB 13.0 12.7  HCT 38.9 40.6  MCV 95.1 95.8  PLT 233 160   Basic Metabolic Panel:  Recent Labs Lab 04/02/17 0936 04/03/17 0524  NA 138 140  K 4.2 4.0  CL 102 108  CO2 26 24  GLUCOSE 130* 129*  BUN 29* 16  CREATININE 0.94 0.87  CALCIUM 10.1 9.1   GFR: Estimated Creatinine Clearance: 51.9 mL/min (by C-G formula based on SCr of 0.87 mg/dL). Liver Function Tests:  Recent Labs Lab 04/02/17 0936 04/03/17 0524  AST 27 25  ALT 16 14  ALKPHOS 79 67  BILITOT 1.8* 1.5*  PROT 7.7 6.9  ALBUMIN 4.7 4.1    Recent Labs Lab 04/02/17 0936  LIPASE 36   No results for input(s): AMMONIA in the last 168 hours. Coagulation Profile:  Recent Labs Lab 04/02/17 1027 04/03/17 0524  INR 2.26 2.27   Cardiac Enzymes:  Recent Labs Lab 04/02/17 0936  TROPONINI <0.03   BNP (last 3 results) No results for input(s): PROBNP in the last 8760 hours. HbA1C: No results for input(s): HGBA1C in the last 72 hours. CBG: No results for input(s): GLUCAP in the last 168 hours. Lipid Profile: No results for input(s): CHOL, HDL, LDLCALC, TRIG, CHOLHDL, LDLDIRECT in the last 72 hours. Thyroid Function Tests:  Recent Labs   04/03/17 0524  TSH 0.266*   Anemia Panel: No results for input(s): VITAMINB12, FOLATE, FERRITIN, TIBC, IRON, RETICCTPCT in the last 72 hours. Urine analysis:    Component Value Date/Time   COLORURINE YELLOW 04/02/2017 0936   APPEARANCEUR CLEAR 04/02/2017 0936   LABSPEC 1.016 04/02/2017 0936   PHURINE 7.0 04/02/2017 0936   GLUCOSEU NEGATIVE 04/02/2017 0936   GLUCOSEU NEGATIVE 07/15/2008 1258   HGBUR TRACE (A) 04/02/2017 0936   BILIRUBINUR NEGATIVE 04/02/2017 0936   BILIRUBINUR neg 08/09/2016 1056   KETONESUR NEGATIVE 04/02/2017 0936   PROTEINUR NEGATIVE 04/02/2017 0936   UROBILINOGEN 0.2 08/09/2016 1056   UROBILINOGEN 0.2 09/12/2014 1810   NITRITE NEGATIVE 04/02/2017 0936   LEUKOCYTESUR NEGATIVE 04/02/2017 0936   Sepsis Labs: @LABRCNTIP (procalcitonin:4,lacticidven:4)  )No results found for this or any previous visit (from the past 240 hour(s)).       Radiology Studies: Ct Abdomen Pelvis W Contrast  Result Date: 04/02/2017 CLINICAL DATA:  Abdominal pain, nausea, vomiting EXAM: CT ABDOMEN AND PELVIS WITH CONTRAST TECHNIQUE: Multidetector CT imaging of the abdomen and pelvis was performed using the standard protocol following bolus administration of intravenous contrast. CONTRAST:  152mL ISOVUE-300 IOPAMIDOL (ISOVUE-300) INJECTION 61% COMPARISON:  12/15/2015 FINDINGS: Lower chest: Cardiomegaly. Linear scarring in lung bases. No effusions. Hepatobiliary: No focal hepatic abnormality. Gallbladder unremarkable. Pancreas: No focal abnormality or ductal dilatation. Spleen: No focal abnormality.  Normal size. Adrenals/Urinary Tract: No adrenal abnormality. No focal renal abnormality. No stones or hydronephrosis. Urinary bladder is unremarkable. Stomach/Bowel: Changes from prior right hemicolectomy. There are dilated small bowel loops throughout the abdomen and pelvis. Decompressed distal small bowel loops. Findings concerning for partial small bowel obstruction. Exact transition point or  cause not visualized. Vascular/Lymphatic: Aortic and iliac calcifications. No aneurysm or adenopathy. Reproductive: Prior hysterectomy.  No adnexal masses. Other: Small amount of free fluid in the pelvis. Musculoskeletal: No acute bony abnormality. IMPRESSION: Dilated small bowel loops in the abdomen and pelvis with decompressed distal small bowel loops. Findings compatible with partial small bowel obstruction. Exact cause and transition point not visualized. Cardiomegaly. Aorta iliac atherosclerosis. Electronically Signed   By: Rolm Baptise M.D.   On: 04/02/2017 11:35        Scheduled Meds: . carvedilol  6.25 mg Oral BID  . [START ON 04/08/2017] estradiol  0.1 mg Transdermal Weekly  . furosemide  40 mg Oral Daily  . ketorolac  1 drop Right Eye BID  . [START ON 04/04/2017] levothyroxine  100 mcg Oral QAC breakfast  . meloxicam  7.5 mg Oral Daily  . metoprolol  2.5 mg Intravenous Q12H  . quinapril  20 mg Oral Daily  . sodium chloride flush  3 mL Intravenous Q12H  . spironolactone  25 mg Oral Daily  . warfarin  5 mg Oral ONCE-1800  . Warfarin - Pharmacist Dosing Inpatient   Does not apply q1800   Continuous Infusions:   LOS: 1 day    Time spent: [place], MD [facility] ([facility]   If 7PM-7AM, please contact night-coverage [email or web address] Password [email or web address] 04/03/2017, 12:50 PM

## 2017-04-03 NOTE — Progress Notes (Signed)
Pt HR increased to 133 non-sustaining, pt states feel the palpitation. She denies chest pain or SOB, MD notified. SRP, RN

## 2017-04-03 NOTE — Progress Notes (Signed)
Patient ID: [name], female   DOB: 06/24/1929, 83 y.o.   MRN: [id number]  [name] is seen today for f/U of afib, HTN, and edema. Her coumadin has been Rx. No palpitations, SOB or SSCP. She is walking on a regular basis without symptoms. She retired from [employer] 2 years ago . Her husband [name] died and she is now living in apartment near [place] has worked well as a diuretic for edema as she requires much less K.   Admitted to hospital 04/02/17 with SBO Rx conservatively with NG tube  TSH suppressed at .266   I care for her sister [name] who had a stroke recently Her and husband [name] who is also a patient of mine moved into [facility]   Echo:  04/17/16 reviewed stable mild AS  Study Conclusions  - Left ventricle: The cavity size was normal. Systolic function was   normal. The estimated ejection fraction was in the range of 60%   to 65%. Wall motion was normal; there were no regional wall   motion abnormalities. The study was not technically sufficient to   allow evaluation of LV diastolic dysfunction due to atrial   fibrillation. - Aortic valve: Severe diffuse thickening and calcification. Valve   mobility was restricted. There was mild stenosis. Mean gradient   (S): 13 mm Hg. Valve area (Vmax): 1.23 cm^2. - Mitral valve: Calcified annulus. Moderate diffuse thickening and   calcification of the anterior leaflet and posterior leaflet.   Mobility of the anterior leaflet was severely restricted.   Diastolic leaflet doming was present. The findings are consistent   with severe stenosis. There was mild to moderate regurgitation.   Mean gradient (D): 14 mm Hg. Valve area by pressure half-time:   1.33 cm^2. - Left atrium: The atrium was massively dilated. - Right ventricle: The cavity size was severely dilated. Wall   thickness was normal. - Right atrium: The atrium was severely dilated. - Pulmonary arteries: PA peak pressure: 56 mm Hg (S).  Has had UTI and flu  requiring 2 hospitalizations this winter Charcot foot LLE seeing [facility]    ROS: Denies fever, malais, weight loss, blurry vision, decreased visual acuity, cough, sputum, SOB, hemoptysis, pleuritic pain, palpitaitons, heartburn, abdominal pain, melena, lower extremity edema, claudication, or rash.  All other systems reviewed and negative  General: Affect appropriate Healthy:  appears stated age 83: normal Neck supple with no adenopathy JVP normal no bruits no thyromegaly Lungs clear with no wheezing and good diaphragmatic motion Heart:  S1/S2 mild AS  murmur, no rub, gallop or click PMI normal Abdomen: benighn, BS positve, no tenderness, no AAA no bruit.  No HSM or HJR Distal pulses intact with no bruits No edema Neuro non-focal Skin warm and dry No muscular weakness      Current Outpatient Prescriptions  Medication Sig Dispense Refill  . ALPRAZolam (XANAX) 0.25 MG tablet TAKE 1 TABLET BY MOUTH AT BEDTIME AS NEEDED FOR SLEEP 30 tablet 5  . atorvastatin (LIPITOR) 20 MG tablet Take 20 mg by mouth daily.    . Calcium Carbonate-Vitamin D (CALTRATE 600+D) 600-400 MG-UNIT per tablet Take 1 tablet by mouth daily.      . carvedilol (COREG) 6.25 MG tablet Take 6.25 mg by mouth daily.    Marland Kitchen estradiol (CLIMARA - DOSED IN MG/24 HR) 0.1 mg/24hr Place 1 patch onto the skin once a week. mondaya    . furosemide (LASIX) 40 MG tablet Take 1 tablet (40 mg total)  by mouth daily. 90 tablet 1  . ketorolac (ACULAR) 0.5 % ophthalmic solution Place 1 drop into the right eye 2 (two) times daily.    . meclizine (ANTIVERT) 25 MG tablet TAKE 1/2 TO 1 TABLET BY MOUTH EVERY 6 HOURS AS NEEDED FOR DIZZINESS 30 tablet 11  . meloxicam (MOBIC) 7.5 MG tablet Take 7.5 mg by mouth daily as needed for dizziness.    . Multiple Vitamins-Minerals (CENTRUM SILVER PO) Take 1 tablet by mouth daily.      . potassium chloride SA (K-DUR,KLOR-CON) 20 MEQ tablet Take 20 mEq by mouth daily.    . quinapril (ACCUPRIL) 20 MG  tablet Take 1 tablet (20 mg total) by mouth daily. 90 tablet 1  . spironolactone (ALDACTONE) 25 MG tablet Take 1 tablet (25 mg total) by mouth daily. 90 tablet 3  . SYNTHROID 100 MCG tablet Take 100 mcg by mouth daily.    . vitamin B-12 (CYANOCOBALAMIN) 1000 MCG tablet Take 1,000 mcg by mouth daily.      Marland Kitchen warfarin (COUMADIN) 5 MG tablet TAKE AS DIRECTED BY ANTICOAGULATION CLINIC 100 tablet 1   No current facility-administered medications for this visit.     Allergies  Codeine  Electrocardiogram:  4/14  afib rate 64  Nonspecific ST/T wave changes  Today afib rate 63 nonspecific St changes similar to 2014  10/04/15  afib rate 62 nonspecific ST changes   Assessment and Plan Afib:  Chronic good rate control INR Rx no bleeding issues stable  Neuropathy:  F/u Regal ulcer healed done with course of antibiotics HTN:  Well controlled.  Continue current medications and low sodium Dash type diet.   Chol:  On statin  Cholesterol is at goal.  Continue current dose of statin and diet Rx.  No myalgias or side effects.  F/U  LFT's in 6 months. Lab Results  Component Value Date   LDLCALC 74 01/11/2017   AS: mild on echo 04/17/16  Mean gradient 13 mmHg  F/u echo   SBO:  Improved has outpatient f/u with Dr Henrene Pastor  Thyroid:  TSH suppressed synthroid dose needs to be decreased asked her to discuss With Dr Sharlene Motts tomorrow         F/U with me in 6 - [place]

## 2017-04-03 NOTE — Evaluation (Signed)
Physical Therapy Evaluation Patient Details Name: [name] MRN: [id number] DOB: 03-14-1929 Today's Date: 04/03/2017   History of Present Illness  83 yo female admitted with SBO. hx of A fib, CHF, anxiety, colon cancer, uterine ca, Charcot foot, peripheral neuropathy, ostoporosis.   Clinical Impression  On eval, pt was Min guard assist for mobility. She walked ~150 feet while holding on to hallway handrail. Pt tolerated activity well. Will follow and progress activity as tolerated. Do not anticipate any follow up PT needs after discharge.     Follow Up Recommendations Supervision - Intermittent    Equipment Recommendations  None recommended by PT    Recommendations for Other Services       Precautions / Restrictions Precautions Precautions: Fall Restrictions Weight Bearing Restrictions: No      Mobility  Bed Mobility Overal bed mobility: Modified Independent                Transfers Overall transfer level: Needs assistance   Transfers: Sit to/from Stand;Stand Pivot Transfers Sit to Stand: Min guard Stand pivot transfers: Min guard       General transfer comment: close guard for safety. Increased time.   Ambulation/Gait Ambulation/Gait assistance: Min guard Ambulation Distance (Feet): 150 Feet Assistive device:  (hallway handrail) Gait Pattern/deviations: Step-through pattern;Decreased stride length     General Gait Details: Unsteady without 1 point of support. Close guard for safety with use of hallway handrail. Pt tolerated distance well.   Stairs            Wheelchair Mobility    Modified Rankin (Stroke Patients Only)       Balance                                             Pertinent Vitals/Pain Pain Assessment: No/denies pain    Home Living Family/patient expects to be discharged to:: Private residence Living Arrangements: Alone   Type of Home: [place]: [place] - single  point Additional Comments: pt planning to transition to [place] on Saturday 12th per pt and family    Prior Function Level of Independence: Independent               Hand Dominance        Extremity/Trunk Assessment   Upper Extremity Assessment Upper Extremity Assessment: Generalized weakness    Lower Extremity Assessment Lower Extremity Assessment: Generalized weakness    Cervical / Trunk Assessment Cervical / Trunk Assessment: Kyphotic  Communication   Communication: No difficulties  Cognition Arousal/Alertness: Awake/alert Behavior During Therapy: WFL for tasks assessed/performed Overall Cognitive Status: Within Functional Limits for tasks assessed                                        General Comments      Exercises     Assessment/Plan    PT Assessment Patient needs continued PT services  PT Problem List Decreased strength;Decreased mobility;Decreased balance;Decreased knowledge of use of DME       PT Treatment Interventions DME instruction;Gait training;Therapeutic activities;Therapeutic exercise;Patient/family education;Balance training;Functional mobility training    PT Goals (Current goals can be found in the Care Plan section)  Acute Rehab PT Goals Patient Stated Goal: home soon. regain PLOF PT Goal  Formulation: With patient Time For Goal Achievement: 04/17/17 Potential to Achieve Goals: Good    Frequency Min 3X/week   Barriers to discharge        Co-evaluation               AM-PAC PT "6 Clicks" Daily Activity  Outcome Measure Difficulty turning over in bed (including adjusting bedclothes, sheets and blankets)?: None Difficulty moving from lying on back to sitting on the side of the bed? : None Difficulty sitting down on and standing up from a chair with arms (e.g., wheelchair, bedside commode, etc,.)?: A Little Help needed moving to and from a bed to chair (including a wheelchair)?: A Little Help needed  walking in hospital room?: A Little Help needed climbing 3-5 steps with a railing? : A Little 6 Click Score: 20    End of Session Equipment Utilized During Treatment: Gait belt Activity Tolerance: Patient tolerated treatment well Patient left: in chair;with call bell/phone within reach;with family/visitor present   PT Visit Diagnosis: Muscle weakness (generalized) (M62.81);Difficulty in walking, not elsewhere classified (R26.2)    Time: 8341-9622 PT Time Calculation (min) (ACUTE ONLY): 12 min   Charges:   PT Evaluation $PT Eval Low Complexity: 1 Procedure     PT G Codes:          Weston Anna, MPT Pager: [phone]

## 2017-04-03 NOTE — Progress Notes (Signed)
ANTICOAGULATION CONSULT NOTE - Initial Consult  Pharmacy Consult for Warfarin Indication: atrial fibrillation  Allergies  Allergen Reactions  . Codeine Nausea Only    REACTION: nausea    Patient Measurements: Height: 5\' 11"  (180.3 cm) Weight: 173 lb 11.6 oz (78.8 kg) IBW/kg (Calculated) : 70.8  Vital Signs: Temp: 98 F (36.7 C) (05/09 0451) Temp Source: Oral (05/09 0451) BP: 115/81 (05/09 0451) Pulse Rate: 102 (05/09 0451)  Labs:  Recent Labs  04/02/17 0936 04/02/17 1027 04/03/17 0524  HGB 13.0  --  12.7  HCT 38.9  --  40.6  PLT 233  --  246  LABPROT  --  25.3* 25.4*  INR  --  2.26 2.27  CREATININE 0.94  --  0.87  TROPONINI <0.03  --   --     Estimated Creatinine Clearance: 51.9 mL/min (by C-G formula based on SCr of 0.87 mg/dL).   Medical History: Past Medical History:  Diagnosis Date  . Anxiety   . Atrial fibrillation ([place])   . Cancer ([place])   . Cataract    REMOVED BILATERAL  . Chest pain, unspecified   . Chronic low back pain   . Diverticulosis of colon   . History of colon cancer   . History of colonic polyps   . History of colonoscopy   . History of uterine cancer   . Hyperlipidemia   . Hypertension   . Hypothyroid   . Impaired glucose tolerance   . Mitral valve disorder   . Osteoarthritis   . Osteoporosis   . Patent foramen ovale   . Peripheral neuropathy   . Peripheral vascular disease ([place])   . Splenic infarction   . UTI (lower urinary tract infection)   . Venous insufficiency   . Vitamin B12 deficiency     Medications:  Prescriptions Prior to Admission  Medication Sig Dispense Refill Last Dose  . ALPRAZolam (XANAX) 0.25 MG tablet TAKE 1 TABLET BY MOUTH AT BEDTIME AS NEEDED FOR SLEEP 30 tablet 5 04/01/2017 at Unknown time  . atorvastatin (LIPITOR) 20 MG tablet TAKE 1 TABLET DAILY 90 tablet 3 04/01/2017 at Unknown time  . Calcium Carbonate-Vitamin D (CALTRATE 600+D) 600-400 MG-UNIT per tablet Take 1 tablet by mouth daily.     04/01/2017 at  Unknown time  . carvedilol (COREG) 6.25 MG tablet TAKE 1 TABLET TWICE A DAY 180 tablet 3 04/02/2017 at 0530  . estradiol (CLIMARA - DOSED IN MG/24 HR) 0.1 mg/24hr Place 1 patch onto the skin once a week. mondaya   Past Week at Unknown time  . furosemide (LASIX) 40 MG tablet Take 1 tablet (40 mg total) by mouth daily. 90 tablet 1 04/01/2017 at Unknown time  . ketorolac (ACULAR) 0.5 % ophthalmic solution Place 1 drop into the right eye 2 (two) times daily.   04/01/2017 at Unknown time  . levothyroxine (SYNTHROID, LEVOTHROID) 100 MCG tablet TAKE 1 TABLET DAILY 90 tablet 1 04/02/2017 at Unknown time  . loperamide (IMODIUM A-D) 2 MG tablet Take 2 mg by mouth 4 (four) times daily as needed for diarrhea or loose stools.   Past Month at Unknown time  . meclizine (ANTIVERT) 25 MG tablet TAKE 1/2 TO 1 TABLET BY MOUTH EVERY 6 HOURS AS NEEDED FOR DIZZINESS 30 tablet 11 04/01/2017 at Unknown time  . meloxicam (MOBIC) 7.5 MG tablet TAKE 1 TABLET DAILY 90 tablet 1 04/01/2017 at Unknown time  . Multiple Vitamins-Minerals (CENTRUM SILVER PO) Take 1 tablet by mouth daily.  04/01/2017 at Unknown time  . potassium chloride SA (K-DUR,KLOR-CON) 20 MEQ tablet TAKE 1 TABLET TWICE A DAY 180 tablet 2 04/01/2017 at Unknown time  . spironolactone (ALDACTONE) 25 MG tablet Take 1 tablet (25 mg total) by mouth daily. 90 tablet 1 04/01/2017 at Unknown time  . vitamin B-12 (CYANOCOBALAMIN) 1000 MCG tablet Take 1,000 mcg by mouth daily.     04/01/2017 at Unknown time  . warfarin (COUMADIN) 5 MG tablet TAKE AS DIRECTED BY ANTICOAGULATION CLINIC 100 tablet 1 04/01/2017 at 2000   Scheduled:  . [START ON 04/04/2017] levothyroxine  100 mcg Oral QAC breakfast  . metoprolol  2.5 mg Intravenous Q12H  . sodium chloride flush  3 mL Intravenous Q12H    Assessment: 88 yoF admitted on 5/8 with abdominal pain and partial SBO.  PMH includes warfarin for atrial fibrillation which was held on admission.  She has had 2 BMs and the NG tube will be removed today.   Pharmacy has been consulted to resume warfarin dosing.  Home warfarin dosing: 5 mg PO daily Admit INR 2.26  Today, 04/03/2017:  INR 2.27  CBC: Hgb 12.7 and Plt 246, stable, WNL  No bleeding or complications reported   Goal of Therapy:  INR 2-3 Monitor platelets by anticoagulation protocol: Yes   Plan:  Warfarin 5 mg PO x 1 at 1800 Daily PT/INR. Monitor for signs and symptoms of bleeding.  Gretta Arab PharmD, BCPS Pager [phone] 04/03/2017 11:19 AM

## 2017-04-04 LAB — PROTIME-INR
INR: 2.8
PROTHROMBIN TIME: 30 s — AB (ref 11.4–15.2)

## 2017-04-04 NOTE — Final Consult Note (Signed)
Consultant Final Sign-Off Note    Assessment/Final recommendations  [name] is a 83 y.o. female followed by me for small bowel obstruction. She is now having flatus and tolerating clears and her abdominal pain is improved   Wound care (if applicable):    Diet at discharge: heart healthy diet   Activity at discharge: per primary team   Follow-up appointment:     Pending results:  Unresulted Labs    Start     Ordered   04/04/17 0500  Protime-INR  Daily,   R    Question:  Specimen collection method  Answer:  Lab=Lab collect   04/03/17 1016       Medication recommendations:   Other recommendations:    Thank you for allowing [place] to participate in the care of your patient!  Please consult [place] again if you have further needs for your patient.  Arta Bruce Jaana Brodt 04/04/2017 7:47 AM    Subjective     Objective  Vital signs in last 24 hours: Temp:  [97.9 F (36.6 C)-98 F (36.7 C)] 97.9 F (36.6 C) (05/10 0436) Pulse Rate:  [68-75] 75 (05/10 0436) Resp:  [18] 18 (05/10 0436) BP: (85-104)/(46-63) 103/63 (05/10 0436) SpO2:  [94 %-97 %] 97 % (05/10 0436)  General: NAD   Pertinent labs and Studies:  Recent Labs  04/02/17 0936 04/03/17 0524  WBC 9.9 8.5  HGB 13.0 12.7  HCT 38.9 40.6   BMET  Recent Labs  04/02/17 0936 04/03/17 0524  NA 138 140  K 4.2 4.0  CL 102 108  CO2 26 24  GLUCOSE 130* 129*  BUN 29* 16  CREATININE 0.94 0.87  CALCIUM 10.1 9.1   No results for input(s): LABURIN in the last 72 hours. Results for orders placed or performed in visit on 08/09/16  Urine culture     Status: None   Collection Time: 08/09/16 12:54 PM  Result Value Ref Range Status   Colony Count 10,000-50,000 CFU/mL  Final   Organism ID, Bacteria   Final    Three or more organisms present,each greater than 10,000 CFU/mL.These organisms,commonly found on external and internal genitalia,are considered to be colonizers.No further testing performed.      Imaging: No results found.

## 2017-04-04 NOTE — Discharge Summary (Signed)
Physician Discharge Summary  [name] YTK:[id number] DOB: 1928/12/14 DOA: 04/02/2017  PCP: Laurey Morale, MD  Admit date: 04/02/2017 Discharge date: 04/04/2017  Time spent: 35 minutes  Recommendations for Outpatient Follow-up:  1. Soft diet for 1 week 2. Please discuss with patient contuinued Estrogen at her age-probably not very beneficial overall 3. Please monitor BP as OP--might need less ACE 4. Needs tsh in 1 mo   Discharge Diagnoses:  Active Problems:   Hypothyroidism   Hyperlipemia   Essential hypertension   ATRIAL FIBRILLATION   Long term current use of anticoagulant   SBO (small bowel obstruction) ([place])   Discharge Condition: improved  Diet recommendation: soft diet  Filed Weights   04/02/17 0912 04/02/17 1355  Weight: 79.8 kg (176 lb) 78.8 kg (173 lb 11.6 oz)    History of present illness:  76 Known history of right hemicolectomy in 1994 with surveillance colonoscopy last done 04/2010-8 mm adenoma Atrial fibrillation on Coumadin PFO History of splenic infarct History uterine cancer grade 1 stage I in 1991             Status post anterior and posterior colporrhaphy and suspension 03/2006 On chronic estrogen since 1994  Admitted with small bowel obstruction 04/02/17 Past 2 stools subsequently-NG tube was removed on 5/9   Hospital Course:   Small bowel obstruction CT scan showed partial SBO without transition point. Has passed 2 stools since admission. Graduated by general surgery to liquid diet Expect will be able to advance to regular diet head by a.m. 5/10 but we'll monitor for recurrence  Atrial fibrillation, [place] score >4 on Coumadin Some issues with rate control 5/8 PM as well as 5/9 AM Reimplement Coreg at home dose 6.25 twice a day  Diastolic heart failure Continue Coreg 6.25 twice a day, lisinopril 20 daily, Aldactone 25 daily Outpatient follow-up with her cardiologist  Postmenopausal On [place] 0.1 every week. Needs outpatient  discussion about risks and benefits with her primary physician   Procedures:  none  Consultations:  gen surgery  Discharge Exam: Vitals:   04/03/17 2239 04/04/17 0436  BP: (!) 104/59 103/63  Pulse: 70 75  Resp:  18  Temp:  97.9 F (36.6 C)    General: eomi ncat Cardiovascular: s1 s2 loud hsm Respiratory: clear no rales abd soft nt no rebound  Discharge Instructions   Discharge Instructions    Diet - low sodium heart healthy    Complete by:  As directed    Discharge instructions    Complete by:  As directed    No more immodium Soft diet for ~ 1 week watch your blood pressure and heart rate carefully-you might not need as much medication and please discuss your accupril with your MD Evidence for estrogen long-term is unclear-please discuss with your MD continued use   Increase activity slowly    Complete by:  As directed      Current Discharge Medication List    CONTINUE these medications which have NOT CHANGED   Details  ALPRAZolam (XANAX) 0.25 MG tablet TAKE 1 TABLET BY MOUTH AT BEDTIME AS NEEDED FOR SLEEP Qty: 30 tablet, Refills: 5    atorvastatin (LIPITOR) 20 MG tablet TAKE 1 TABLET DAILY Qty: 90 tablet, Refills: 3    Calcium Carbonate-Vitamin D (CALTRATE 600+D) 600-400 MG-UNIT per tablet Take 1 tablet by mouth daily.      carvedilol (COREG) 6.25 MG tablet TAKE 1 TABLET TWICE A DAY Qty: 180 tablet, Refills: 3    estradiol (CLIMARA -  DOSED IN MG/24 HR) 0.1 mg/24hr Place 1 patch onto the skin once a week. mondaya    furosemide (LASIX) 40 MG tablet Take 1 tablet (40 mg total) by mouth daily. Qty: 90 tablet, Refills: 1    ketorolac (ACULAR) 0.5 % ophthalmic solution Place 1 drop into the right eye 2 (two) times daily.    levothyroxine (SYNTHROID, LEVOTHROID) 100 MCG tablet TAKE 1 TABLET DAILY Qty: 90 tablet, Refills: 1    meclizine (ANTIVERT) 25 MG tablet TAKE 1/2 TO 1 TABLET BY MOUTH EVERY 6 HOURS AS NEEDED FOR DIZZINESS Qty: 30 tablet, Refills: 11     meloxicam (MOBIC) 7.5 MG tablet TAKE 1 TABLET DAILY Qty: 90 tablet, Refills: 1    Multiple Vitamins-Minerals (CENTRUM SILVER PO) Take 1 tablet by mouth daily.      potassium chloride SA (K-DUR,KLOR-CON) 20 MEQ tablet TAKE 1 TABLET TWICE A DAY Qty: 180 tablet, Refills: 2    spironolactone (ALDACTONE) 25 MG tablet Take 1 tablet (25 mg total) by mouth daily. Qty: 90 tablet, Refills: 1    vitamin B-12 (CYANOCOBALAMIN) 1000 MCG tablet Take 1,000 mcg by mouth daily.      warfarin (COUMADIN) 5 MG tablet TAKE AS DIRECTED BY ANTICOAGULATION CLINIC Qty: 100 tablet, Refills: 1    quinapril (ACCUPRIL) 20 MG tablet Take 1 tablet (20 mg total) by mouth daily. Qty: 90 tablet, Refills: 1      STOP taking these medications     loperamide (IMODIUM A-D) 2 MG tablet        Allergies  Allergen Reactions  . Codeine Nausea Only    REACTION: nausea      The results of significant diagnostics from this hospitalization (including imaging, microbiology, ancillary and laboratory) are listed below for reference.    Significant Diagnostic Studies: Ct Abdomen Pelvis W Contrast  Result Date: 04/02/2017 CLINICAL DATA:  Abdominal pain, nausea, vomiting EXAM: CT ABDOMEN AND PELVIS WITH CONTRAST TECHNIQUE: Multidetector CT imaging of the abdomen and pelvis was performed using the standard protocol following bolus administration of intravenous contrast. CONTRAST:  14mL ISOVUE-300 IOPAMIDOL (ISOVUE-300) INJECTION 61% COMPARISON:  12/15/2015 FINDINGS: Lower chest: Cardiomegaly. Linear scarring in lung bases. No effusions. Hepatobiliary: No focal hepatic abnormality. Gallbladder unremarkable. Pancreas: No focal abnormality or ductal dilatation. Spleen: No focal abnormality.  Normal size. Adrenals/Urinary Tract: No adrenal abnormality. No focal renal abnormality. No stones or hydronephrosis. Urinary bladder is unremarkable. Stomach/Bowel: Changes from prior right hemicolectomy. There are dilated small bowel loops  throughout the abdomen and pelvis. Decompressed distal small bowel loops. Findings concerning for partial small bowel obstruction. Exact transition point or cause not visualized. Vascular/Lymphatic: Aortic and iliac calcifications. No aneurysm or adenopathy. Reproductive: Prior hysterectomy.  No adnexal masses. Other: Small amount of free fluid in the pelvis. Musculoskeletal: No acute bony abnormality. IMPRESSION: Dilated small bowel loops in the abdomen and pelvis with decompressed distal small bowel loops. Findings compatible with partial small bowel obstruction. Exact cause and transition point not visualized. Cardiomegaly. Aorta iliac atherosclerosis. Electronically Signed   By: Rolm Baptise M.D.   On: 04/02/2017 11:35    Microbiology: No results found for this or any previous visit (from the past 240 hour(s)).   Labs: Basic Metabolic Panel:  Recent Labs Lab 04/02/17 0936 04/03/17 0524  NA 138 140  K 4.2 4.0  CL 102 108  CO2 26 24  GLUCOSE 130* 129*  BUN 29* 16  CREATININE 0.94 0.87  CALCIUM 10.1 9.1   Liver Function Tests:  Recent Labs Lab  04/02/17 0936 04/03/17 0524  AST 27 25  ALT 16 14  ALKPHOS 79 67  BILITOT 1.8* 1.5*  PROT 7.7 6.9  ALBUMIN 4.7 4.1    Recent Labs Lab 04/02/17 0936  LIPASE 36   No results for input(s): AMMONIA in the last 168 hours. CBC:  Recent Labs Lab 04/02/17 0936 04/03/17 0524  WBC 9.9 8.5  HGB 13.0 12.7  HCT 38.9 40.6  MCV 95.1 95.8  PLT 233 246   Cardiac Enzymes:  Recent Labs Lab 04/02/17 0936  TROPONINI <0.03   BNP: BNP (last 3 results) No results for input(s): BNP in the last 8760 hours.  ProBNP (last 3 results) No results for input(s): PROBNP in the last 8760 hours.  CBG: No results for input(s): GLUCAP in the last 168 hours.     SignedNita Sells MD   [facility] 04/04/2017, 8:37 AM

## 2017-04-04 NOTE — Progress Notes (Signed)
Physical Therapy Treatment Patient Details Name: [name] MRN: [id number] DOB: 26-Sep-1929 Today's Date: 04/04/2017    History of Present Illness 83 yo female admitted with SBO. hx of A fib, CHF, anxiety, colon cancer, uterine ca, Charcot foot, peripheral neuropathy, ostoporosis.     PT Comments    Progressing well with mobility. Had pt ambulate with a cane on today (I recommended pt use cane at all times for ambulation safety until she returns to her baseline). Pt tolerated increased activity well. Discussed d/c plan-pt is returning home. She states she is moving to [place] on Sat. Pt does not feel she needs HHPT f/u at this time. She has a visit scheduled with her cardiologist on Monday. Encouraged pt to request a HHPT prescription if she finds she is having difficulty regaining her PLOF. Pt is set to d/c home today.     Follow Up Recommendations  Supervision - Intermittent     Equipment Recommendations  None recommended by PT    Recommendations for Other Services       Precautions / Restrictions Precautions Precautions: Fall Restrictions Weight Bearing Restrictions: No    Mobility  Bed Mobility                  Transfers Overall transfer level: Needs assistance Equipment used: Straight cane Transfers: Sit to/from Stand Sit to Stand: Supervision         General transfer comment: good use of hands for safety  Ambulation/Gait Ambulation/Gait assistance: Min guard Ambulation Distance (Feet): 250 Feet (x2) Assistive device: Straight cane Gait Pattern/deviations: Step-through pattern;Decreased stride length;Drifts right/left;Staggering left;Staggering right     General Gait Details: Close guard for safety. Fair gait speed. Intermittent unsteadiness noted but pt did not have any overt losses of balance. Pt tolerated increased distance well    Stairs            Wheelchair Mobility    Modified Rankin (Stroke Patients Only)        Balance                                            Cognition Arousal/Alertness: Awake/alert Behavior During Therapy: WFL for tasks assessed/performed Overall Cognitive Status: Within Functional Limits for tasks assessed                                        Exercises      General Comments        Pertinent Vitals/Pain Pain Assessment: No/denies pain    Home Living                      Prior Function            PT Goals (current goals can now be found in the care plan section) Progress towards PT goals: Progressing toward goals    Frequency    Min 3X/week      PT Plan Current plan remains appropriate    Co-evaluation              AM-PAC PT "6 Clicks" Daily Activity  Outcome Measure  Difficulty turning over in bed (including adjusting bedclothes, sheets and blankets)?: None Difficulty moving from lying on back to sitting on the side of the bed? :  None Difficulty sitting down on and standing up from a chair with arms (e.g., wheelchair, bedside commode, etc,.)?: A Little Help needed moving to and from a bed to chair (including a wheelchair)?: A Little Help needed walking in hospital room?: A Little Help needed climbing 3-5 steps with a railing? : A Little 6 Click Score: 20    End of Session Equipment Utilized During Treatment: Gait belt Activity Tolerance: Patient tolerated treatment well Patient left: in chair;with call bell/phone within reach;with family/visitor present   PT Visit Diagnosis: Muscle weakness (generalized) (M62.81)     Time: 2248-2500 PT Time Calculation (min) (ACUTE ONLY): 13 min  Charges:  $Gait Training: 8-22 mins                    G Codes:          Weston Anna, MPT Pager: [phone]

## 2017-04-04 NOTE — Progress Notes (Signed)
ANTICOAGULATION CONSULT NOTE - Follow up [place] for Warfarin Indication: atrial fibrillation  Allergies  Allergen Reactions  . Codeine Nausea Only    REACTION: nausea    Patient Measurements: Height: 5\' 11"  (180.3 cm) Weight: 173 lb 11.6 oz (78.8 kg) IBW/kg (Calculated) : 70.8  Vital Signs: Temp: 97.9 F (36.6 C) (05/10 0436) Temp Source: Oral (05/10 0436) BP: 103/63 (05/10 0436) Pulse Rate: 75 (05/10 0436)  Labs:  Recent Labs  04/02/17 0936 04/02/17 1027 04/03/17 0524 04/04/17 0501  HGB 13.0  --  12.7  --   HCT 38.9  --  40.6  --   PLT 233  --  246  --   LABPROT  --  25.3* 25.4* 30.0*  INR  --  2.26 2.27 2.80  CREATININE 0.94  --  0.87  --   TROPONINI <0.03  --   --   --     Estimated Creatinine Clearance: 51.9 mL/min (by C-G formula based on SCr of 0.87 mg/dL).  Assessment: 89 yoF admitted on 5/8 with abdominal pain and partial SBO.  PMH includes warfarin for atrial fibrillation which was held on admission.  She has had 2 BMs and the NG tube will be removed today.  Pharmacy has been consulted to resume warfarin dosing.  Home warfarin dosing: 5 mg PO daily Admit INR 2.26  Today, 04/04/2017:  INR 2.8  CBC (5/8): Hgb 12.7 and Plt 246, stable, WNL  No bleeding or complications reported   Goal of Therapy:  INR 2-3 Monitor platelets by anticoagulation protocol: Yes   Plan:  Continue home warfarin regimen of 5 mg PO daily.  Discharge planned for 5/10, no inpatient orders placed. Follow up with outpatient warfarin clinic to monitor INR Monitor for signs and symptoms of bleeding.  Gretta Arab PharmD, BCPS Pager [phone] 04/04/2017 7:05 AM

## 2017-04-05 ENCOUNTER — Telehealth: Payer: Self-pay | Admitting: *Deleted

## 2017-04-05 NOTE — Telephone Encounter (Signed)
  D/C 04/04/17 (to home) SBO   Transition Care Management Follow-up Telephone Call  How have you been since you were released from the hospital? "I've been doing much better since I was in the hospital. I've ben weak, but doing better"   Do you understand why you were in the hospital? Yes- SBO   Do you understand the discharge instrcutions? YES  Items Reviewed:  Medications reviewed:  Yes, no changes other than discontinuing Immodium, patient voiced understanding to all medication instructions   Allergies reviewed: Yes, no changes   Dietary changes reviewed: YES, patient on soft diet for remainder of the week  Referrals reviewed: Yes, patient to follow up with cardiologist and PCP   Functional Questionnaire:   Activities of Daily Living (ADLs):   She states they are independent in the following: bathing and hygiene, feeding, continence, grooming, toileting and dressing States they require assistance with the following: ambulation patient has home physical therapy and is utilizing cane for ambulation until strength improves.    Any transportation issues/concerns?: No, my family will help me with transportation.    Any patient concerns? no   Confirmed importance and date/time of follow-up visits scheduled: Has appointment with cardiologist on Monday 04/08/17; patient to follow up with PCP on 04/09/17 at 1pm  Confirmed with patient if condition begins to worsen call PCP or go to the ER.  Patient was given the Call-a-Nurse line [phone]: yes

## 2017-04-08 ENCOUNTER — Ambulatory Visit (INDEPENDENT_AMBULATORY_CARE_PROVIDER_SITE_OTHER): Payer: Medicare Other | Admitting: Cardiovascular Disease

## 2017-04-08 ENCOUNTER — Ambulatory Visit (INDEPENDENT_AMBULATORY_CARE_PROVIDER_SITE_OTHER): Payer: Medicare Other | Admitting: *Deleted

## 2017-04-08 ENCOUNTER — Encounter: Payer: Self-pay | Admitting: Cardiovascular Disease

## 2017-04-08 VITALS — BP 100/50 | HR 69 | Ht 71.0 in | Wt 173.8 lb

## 2017-04-08 DIAGNOSIS — I482 Chronic atrial fibrillation, unspecified: Secondary | ICD-10-CM

## 2017-04-08 DIAGNOSIS — I350 Nonrheumatic aortic (valve) stenosis: Secondary | ICD-10-CM | POA: Diagnosis not present

## 2017-04-08 DIAGNOSIS — Z7901 Long term (current) use of anticoagulants: Secondary | ICD-10-CM | POA: Diagnosis not present

## 2017-04-08 DIAGNOSIS — I480 Paroxysmal atrial fibrillation: Secondary | ICD-10-CM

## 2017-04-08 LAB — POCT INR: INR: 2.3

## 2017-04-08 MED ORDER — SPIRONOLACTONE 25 MG PO TABS: 25.0000 mg | ORAL_TABLET | Freq: Every day | ORAL | 3 refills | Status: DC

## 2017-04-08 NOTE — Patient Instructions (Addendum)
Medication Instructions:  Your physician recommends that you continue on your current medications as directed. Please refer to the Current Medication list given to you today.  Labwork: Please have your primary care doctor check your BMET when having other lab work done at his office.  Testing/Procedures: NONE  Follow-Up: Your physician wants you to follow-up in: 6 to 8 weeks with Dr. Johnsie Cancel please coordinate with coumadin appointment.  If you need a refill on your cardiac medications before your next appointment, please call your pharmacy.

## 2017-04-09 ENCOUNTER — Encounter: Payer: Self-pay | Admitting: Family Medicine

## 2017-04-09 ENCOUNTER — Ambulatory Visit (INDEPENDENT_AMBULATORY_CARE_PROVIDER_SITE_OTHER): Payer: Medicare Other | Admitting: Family Medicine

## 2017-04-09 VITALS — BP 113/75 | HR 83 | Temp 98.0°F | Ht 71.0 in | Wt 173.0 lb

## 2017-04-09 DIAGNOSIS — I481 Persistent atrial fibrillation: Secondary | ICD-10-CM | POA: Diagnosis not present

## 2017-04-09 DIAGNOSIS — I10 Essential (primary) hypertension: Secondary | ICD-10-CM

## 2017-04-09 DIAGNOSIS — I350 Nonrheumatic aortic (valve) stenosis: Secondary | ICD-10-CM | POA: Diagnosis not present

## 2017-04-09 DIAGNOSIS — Z09 Encounter for follow-up examination after completed treatment for conditions other than malignant neoplasm: Secondary | ICD-10-CM

## 2017-04-09 DIAGNOSIS — Z8719 Personal history of other diseases of the digestive system: Secondary | ICD-10-CM | POA: Diagnosis not present

## 2017-04-09 DIAGNOSIS — E039 Hypothyroidism, unspecified: Secondary | ICD-10-CM | POA: Diagnosis not present

## 2017-04-09 DIAGNOSIS — I4819 Other persistent atrial fibrillation: Secondary | ICD-10-CM

## 2017-04-09 DIAGNOSIS — K56609 Unspecified intestinal obstruction, unspecified as to partial versus complete obstruction: Secondary | ICD-10-CM

## 2017-04-09 LAB — TSH: TSH: 0.57 u[IU]/mL (ref 0.35–4.50)

## 2017-04-09 LAB — T4, FREE: FREE T4: 1.29 ng/dL (ref 0.60–1.60)

## 2017-04-09 LAB — T3, FREE: T3, Free: 2.9 pg/mL (ref 2.3–4.2)

## 2017-04-09 NOTE — Patient Instructions (Signed)
WE NOW OFFER   [facility] FAST TRACK!!!  SAME DAY Appointments for ACUTE CARE  Such as: Sprains, Injuries, cuts, abrasions, rashes, muscle pain, joint pain, back pain Colds, flu, sore throats, headache, allergies, cough, fever  Ear pain, sinus and eye infections Abdominal pain, nausea, vomiting, diarrhea, upset stomach Animal/insect bites  3 Easy Ways to Schedule: Walk-In Scheduling Call in scheduling Mychart Sign-up: [email or web address]         

## 2017-04-09 NOTE — Progress Notes (Signed)
   Subjective:    Patient ID: NASIAH POLINSKY, female    DOB: 1928-12-31, 83 y.o.   MRN: [id number]  HPI Here for a transitional care management visit to follow up on a hospital stay from 04-02-17 to 04-04-17 for a partial SBO. She was made NPO for a few days and this resolved on its own. Since going back home she has felt fine. Her appetite is good, she denies any abdominal pain or bloating or nasuea, and her BMs are normal. While in the hospital her labs incldued a suppressed TSH which we need to evaluate. She saw Dr. Johnsie Cancel yesterday for the HTN, atrial fibrillation, and aorticc stenosis. He was pleased with her status and recommended all her cardiac meds to remain the same. Last weekend she was able to move into her new assisted living apartment and she is very happy there. Se is evenm able to keep her dog with her.    Review of Systems  Constitutional: Negative.   Respiratory: Negative.   Cardiovascular: Negative.   Gastrointestinal: Negative.   Neurological: Negative.        Objective:   Physical Exam  Constitutional: She is oriented to person, place, and time. She appears well-developed and well-nourished.  Neck: No thyromegaly present.  Cardiovascular: Normal rate and intact distal pulses.   Irregular rhythm. Her usual 3/6 SM is present.   Pulmonary/Chest: Effort normal and breath sounds normal. No respiratory distress. She has no wheezes. She has no rales.  Abdominal: Soft. Bowel sounds are normal. She exhibits no distension and no mass. There is no tenderness. There is no rebound and no guarding.  Musculoskeletal: She exhibits no edema.  Lymphadenopathy:    She has no cervical adenopathy.  Neurological: She is alert and oriented to person, place, and time.          Assessment & Plan:  She has recovered well from a partial SBO. Her HTN and atrial fibrillation are stable. We will draw a full thyroid panel today to evaluate the abnormal TSH.  Alysia Penna, MD

## 2017-04-10 ENCOUNTER — Telehealth: Payer: Self-pay | Admitting: Family Medicine

## 2017-04-10 NOTE — Telephone Encounter (Signed)
Pt is returning sylvia call °

## 2017-04-10 NOTE — Telephone Encounter (Signed)
Please see lab results.

## 2017-04-17 DIAGNOSIS — M14672 Charcot's joint, left ankle and foot: Secondary | ICD-10-CM | POA: Diagnosis not present

## 2017-05-03 ENCOUNTER — Ambulatory Visit: Payer: Medicare Other | Admitting: Internal Medicine

## 2017-05-16 ENCOUNTER — Ambulatory Visit (INDEPENDENT_AMBULATORY_CARE_PROVIDER_SITE_OTHER): Payer: Medicare Other | Admitting: Pharmacist

## 2017-05-16 DIAGNOSIS — I482 Chronic atrial fibrillation, unspecified: Secondary | ICD-10-CM

## 2017-05-16 DIAGNOSIS — Z7901 Long term (current) use of anticoagulants: Secondary | ICD-10-CM | POA: Diagnosis not present

## 2017-05-16 DIAGNOSIS — I4819 Other persistent atrial fibrillation: Secondary | ICD-10-CM

## 2017-05-16 DIAGNOSIS — I481 Persistent atrial fibrillation: Secondary | ICD-10-CM

## 2017-05-16 LAB — POCT INR: INR: 2.4

## 2017-05-18 ENCOUNTER — Other Ambulatory Visit: Payer: Self-pay | Admitting: Cardiovascular Disease

## 2017-05-18 NOTE — Progress Notes (Signed)
Patient ID: [name], female   DOB: 10/07/1929, 83 y.o.   MRN: [id number]  [name] is seen today for f/U of afib, HTN, and edema. Her coumadin has been Rx. No palpitations, SOB or SSCP. She is walking on a regular basis without symptoms. She retired from [employer] 3 years ago . Her husband [name] died and she is now living in apartment near [place] has worked well as a diuretic for edema as she requires much less K.   Admitted to hospital 04/02/17 with SBO Rx conservatively with NG tube  TSH suppressed at .266   I care for her sister [name] who had a stroke recently Her and husband [name] who is also a patient of mine moved into [facility]   Echo:  04/17/16 reviewed stable mild AS  Study Conclusions  - Left ventricle: The cavity size was normal. Systolic function was   normal. The estimated ejection fraction was in the range of 60%   to 65%. Wall motion was normal; there were no regional wall   motion abnormalities. The study was not technically sufficient to   allow evaluation of LV diastolic dysfunction due to atrial   fibrillation. - Aortic valve: Severe diffuse thickening and calcification. Valve   mobility was restricted. There was mild stenosis. Mean gradient   (S): 13 mm Hg. Valve area (Vmax): 1.23 cm^2. - Mitral valve: Calcified annulus. Moderate diffuse thickening and   calcification of the anterior leaflet and posterior leaflet.   Mobility of the anterior leaflet was severely restricted.   Diastolic leaflet doming was present. The findings are consistent   with severe stenosis. There was mild to moderate regurgitation.   Mean gradient (D): 14 mm Hg. Valve area by pressure half-time:   1.33 cm^2. - Left atrium: The atrium was massively dilated. - Right ventricle: The cavity size was severely dilated. Wall   thickness was normal. - Right atrium: The atrium was severely dilated. - Pulmonary arteries: PA peak pressure: 56 mm Hg (S).  Some bruising right  inner thigh from varicose veins Left charcot foot bothering her Going to see Doran Durand this week may Need to be in boot again    ROS: Denies fever, malais, weight loss, blurry vision, decreased visual acuity, cough, sputum, SOB, hemoptysis, pleuritic pain, palpitaitons, heartburn, abdominal pain, melena, lower extremity edema, claudication, or rash.  All other systems reviewed and negative  General: Affect appropriate Healthy:  appears stated age 83: normal Neck supple with no adenopathy JVP normal no bruits no thyromegaly Lungs clear with no wheezing and good diaphragmatic motion Heart:  S1/S2 mild AS  murmur, no rub, gallop or click PMI normal Abdomen: benighn, BS positve, no tenderness, no AAA no bruit.  No HSM or HJR Distal pulses intact with no bruits No edema Neuro non-focal Varicose veins with bruise on inside left thigh  Charcot joint right foot no skin breakdown       Current Outpatient Prescriptions  Medication Sig Dispense Refill  . ALPRAZolam (XANAX) 0.25 MG tablet TAKE 1 TABLET BY MOUTH AT BEDTIME AS NEEDED FOR SLEEP 30 tablet 5  . atorvastatin (LIPITOR) 20 MG tablet Take 20 mg by mouth daily.    . Calcium Carbonate-Vitamin D (CALTRATE 600+D) 600-400 MG-UNIT per tablet Take 1 tablet by mouth daily.      . carvedilol (COREG) 6.25 MG tablet Take 6.25 mg by mouth daily.    Marland Kitchen estradiol (CLIMARA - DOSED IN MG/24 HR) 0.1 mg/24hr Place 1 patch onto  the skin once a week. mondaya    . furosemide (LASIX) 40 MG tablet Take 1 tablet (40 mg total) by mouth daily. 90 tablet 1  . ketorolac (ACULAR) 0.5 % ophthalmic solution Place 1 drop into the right eye 2 (two) times daily.    . meclizine (ANTIVERT) 25 MG tablet TAKE 1/2 TO 1 TABLET BY MOUTH EVERY 6 HOURS AS NEEDED FOR DIZZINESS 30 tablet 11  . meloxicam (MOBIC) 7.5 MG tablet Take 7.5 mg by mouth daily as needed for dizziness.    . Multiple Vitamins-Minerals (CENTRUM SILVER PO) Take 1 tablet by mouth daily.      . potassium  chloride SA (K-DUR,KLOR-CON) 20 MEQ tablet Take 20 mEq by mouth daily.    . quinapril (ACCUPRIL) 20 MG tablet Take 1 tablet (20 mg total) by mouth daily. 90 tablet 1  . spironolactone (ALDACTONE) 25 MG tablet Take 1 tablet (25 mg total) by mouth daily. 90 tablet 3  . SYNTHROID 100 MCG tablet Take 100 mcg by mouth daily.    . vitamin B-12 (CYANOCOBALAMIN) 1000 MCG tablet Take 1,000 mcg by mouth daily.      Marland Kitchen warfarin (COUMADIN) 5 MG tablet TAKE AS DIRECTED BY ANTICOAGULATION CLINIC 100 tablet 1   No current facility-administered medications for this visit.     Allergies  Codeine  Electrocardiogram:  4/14  afib rate 64  Nonspecific ST/T wave changes  Today afib rate 63 nonspecific St changes similar to 2014  10/04/15  afib rate 62 nonspecific ST changes   Assessment and Plan Afib:  Chronic good rate control INR Rx no bleeding issues stable  Neuropathy:  F/u [facility]   HTN:  Well controlled.  Continue current medications and low sodium Dash type diet.   Chol:  On statin  Cholesterol is at goal.  Continue current dose of statin and diet Rx.  No myalgias or side effects.  F/U  LFT's in 6 months. Lab Results  Component Value Date   LDLCALC 74 01/11/2017   AS: mild on echo 04/17/16  Mean gradient 13 mmHg      SBO:  Improved has outpatient f/u with Dr Henrene Pastor  Thyroid:   Synthroid dose reduced last year  Lab Results  Component Value Date   TSH 0.57 04/09/2017  Ortho:  Charcot joint left foot F/U [facility] likely need another period of imobilizaiton          F/U with me in 6 - [place]

## 2017-05-20 ENCOUNTER — Ambulatory Visit (INDEPENDENT_AMBULATORY_CARE_PROVIDER_SITE_OTHER): Payer: Medicare Other | Admitting: *Deleted

## 2017-05-20 ENCOUNTER — Ambulatory Visit (INDEPENDENT_AMBULATORY_CARE_PROVIDER_SITE_OTHER): Payer: Medicare Other | Admitting: Cardiovascular Disease

## 2017-05-20 VITALS — BP 108/68 | HR 60 | Ht 71.0 in | Wt 174.0 lb

## 2017-05-20 DIAGNOSIS — I10 Essential (primary) hypertension: Secondary | ICD-10-CM

## 2017-05-20 DIAGNOSIS — E784 Other hyperlipidemia: Secondary | ICD-10-CM | POA: Diagnosis not present

## 2017-05-20 DIAGNOSIS — I482 Chronic atrial fibrillation, unspecified: Secondary | ICD-10-CM

## 2017-05-20 DIAGNOSIS — Z7901 Long term (current) use of anticoagulants: Secondary | ICD-10-CM | POA: Diagnosis not present

## 2017-05-20 DIAGNOSIS — E7849 Other hyperlipidemia: Secondary | ICD-10-CM

## 2017-05-20 LAB — POCT INR: INR: 2.3

## 2017-05-20 NOTE — Patient Instructions (Signed)

## 2017-05-24 DIAGNOSIS — M14672 Charcot's joint, left ankle and foot: Secondary | ICD-10-CM | POA: Diagnosis not present

## 2017-06-08 ENCOUNTER — Other Ambulatory Visit: Payer: Self-pay | Admitting: Cardiovascular Disease

## 2017-06-19 DIAGNOSIS — M14672 Charcot's joint, left ankle and foot: Secondary | ICD-10-CM | POA: Diagnosis not present

## 2017-06-25 DIAGNOSIS — H35351 Cystoid macular degeneration, right eye: Secondary | ICD-10-CM | POA: Diagnosis not present

## 2017-06-25 DIAGNOSIS — H35371 Puckering of macula, right eye: Secondary | ICD-10-CM | POA: Diagnosis not present

## 2017-06-25 DIAGNOSIS — H35362 Drusen (degenerative) of macula, left eye: Secondary | ICD-10-CM | POA: Diagnosis not present

## 2017-06-25 DIAGNOSIS — H353131 Nonexudative age-related macular degeneration, bilateral, early dry stage: Secondary | ICD-10-CM | POA: Diagnosis not present

## 2017-07-01 ENCOUNTER — Ambulatory Visit (INDEPENDENT_AMBULATORY_CARE_PROVIDER_SITE_OTHER): Payer: Medicare Other | Admitting: *Deleted

## 2017-07-01 DIAGNOSIS — Z7901 Long term (current) use of anticoagulants: Secondary | ICD-10-CM | POA: Diagnosis not present

## 2017-07-01 DIAGNOSIS — I482 Chronic atrial fibrillation, unspecified: Secondary | ICD-10-CM

## 2017-07-01 LAB — POCT INR: INR: 2.3

## 2017-07-19 DIAGNOSIS — M14672 Charcot's joint, left ankle and foot: Secondary | ICD-10-CM | POA: Diagnosis not present

## 2017-08-05 DIAGNOSIS — Z23 Encounter for immunization: Secondary | ICD-10-CM | POA: Diagnosis not present

## 2017-08-10 ENCOUNTER — Other Ambulatory Visit: Payer: Self-pay | Admitting: Cardiovascular Disease

## 2017-08-12 ENCOUNTER — Ambulatory Visit (INDEPENDENT_AMBULATORY_CARE_PROVIDER_SITE_OTHER): Payer: Medicare Other | Admitting: Pharmacist Clinician (PhC)/ Clinical Pharmacy Specialist

## 2017-08-12 DIAGNOSIS — I482 Chronic atrial fibrillation, unspecified: Secondary | ICD-10-CM

## 2017-08-12 DIAGNOSIS — Z7901 Long term (current) use of anticoagulants: Secondary | ICD-10-CM

## 2017-08-12 LAB — POCT INR: INR: 1.9

## 2017-08-24 ENCOUNTER — Other Ambulatory Visit: Payer: Self-pay | Admitting: Cardiovascular Disease

## 2017-08-24 ENCOUNTER — Other Ambulatory Visit: Payer: Self-pay | Admitting: Family Medicine

## 2017-08-30 DIAGNOSIS — M14672 Charcot's joint, left ankle and foot: Secondary | ICD-10-CM | POA: Diagnosis not present

## 2017-09-03 ENCOUNTER — Telehealth: Payer: Self-pay | Admitting: Family Medicine

## 2017-09-03 NOTE — Telephone Encounter (Signed)
Call in #[phone] with 5 rf 

## 2017-09-03 NOTE — Telephone Encounter (Signed)
Refill request for Xanax 0.25 mg and a 90 day supply to [facility] [address].

## 2017-09-05 ENCOUNTER — Telehealth: Payer: Self-pay | Admitting: Family Medicine

## 2017-09-05 MED ORDER — ALPRAZOLAM 0.25 MG PO TABS: 0.2500 mg | ORAL_TABLET | Freq: Every evening | ORAL | 1 refills | Status: DC | PRN

## 2017-09-05 NOTE — Telephone Encounter (Signed)
Pt calling to check the status of this Rx and she is aware of the above msg from Dr. Sarajane Jews

## 2017-09-05 NOTE — Telephone Encounter (Signed)
I called in script [facility].

## 2017-09-05 NOTE — Telephone Encounter (Signed)
error 

## 2017-09-23 ENCOUNTER — Ambulatory Visit (INDEPENDENT_AMBULATORY_CARE_PROVIDER_SITE_OTHER): Payer: Medicare Other | Admitting: *Deleted

## 2017-09-23 DIAGNOSIS — Z7901 Long term (current) use of anticoagulants: Secondary | ICD-10-CM

## 2017-09-23 DIAGNOSIS — I482 Chronic atrial fibrillation, unspecified: Secondary | ICD-10-CM

## 2017-09-23 LAB — POCT INR: INR: 1.8

## 2017-09-30 DIAGNOSIS — Z85828 Personal history of other malignant neoplasm of skin: Secondary | ICD-10-CM | POA: Diagnosis not present

## 2017-09-30 DIAGNOSIS — B009 Herpesviral infection, unspecified: Secondary | ICD-10-CM | POA: Diagnosis not present

## 2017-09-30 DIAGNOSIS — D485 Neoplasm of uncertain behavior of skin: Secondary | ICD-10-CM | POA: Diagnosis not present

## 2017-09-30 DIAGNOSIS — L821 Other seborrheic keratosis: Secondary | ICD-10-CM | POA: Diagnosis not present

## 2017-09-30 DIAGNOSIS — L570 Actinic keratosis: Secondary | ICD-10-CM | POA: Diagnosis not present

## 2017-10-10 ENCOUNTER — Ambulatory Visit (INDEPENDENT_AMBULATORY_CARE_PROVIDER_SITE_OTHER): Payer: Medicare Other | Admitting: *Deleted

## 2017-10-10 DIAGNOSIS — Z7901 Long term (current) use of anticoagulants: Secondary | ICD-10-CM

## 2017-10-10 DIAGNOSIS — I482 Chronic atrial fibrillation, unspecified: Secondary | ICD-10-CM

## 2017-10-10 DIAGNOSIS — Z5181 Encounter for therapeutic drug level monitoring: Secondary | ICD-10-CM

## 2017-10-10 LAB — POCT INR: INR: 4.1

## 2017-10-10 NOTE — Patient Instructions (Signed)
Pt has been taking 1.5 tablets daily so instructed to hold coumadin on Nov 15th and no coumadin on Nov 16th then take coumadin as instructed on last visit 1 tablet daily except 1.5 tablets on Mondays.  Recheck in 2 weeks.  Call our office if you have any concerns or if you are put on any new medications [phone]

## 2017-10-11 DIAGNOSIS — L97511 Non-pressure chronic ulcer of other part of right foot limited to breakdown of skin: Secondary | ICD-10-CM | POA: Diagnosis not present

## 2017-10-24 DIAGNOSIS — L97511 Non-pressure chronic ulcer of other part of right foot limited to breakdown of skin: Secondary | ICD-10-CM | POA: Diagnosis not present

## 2017-10-25 ENCOUNTER — Ambulatory Visit (INDEPENDENT_AMBULATORY_CARE_PROVIDER_SITE_OTHER): Payer: Medicare Other | Admitting: Pharmacist

## 2017-10-25 DIAGNOSIS — I482 Chronic atrial fibrillation, unspecified: Secondary | ICD-10-CM

## 2017-10-25 DIAGNOSIS — Z7901 Long term (current) use of anticoagulants: Secondary | ICD-10-CM

## 2017-10-25 LAB — POCT INR: INR: 2

## 2017-10-25 NOTE — Patient Instructions (Signed)
Continue 1 tablet daily except 1.5 tablets on Mondays.  Recheck in 3 weeks.  Call our office if you have any concerns or if you are put on any new medications [phone]

## 2017-11-07 ENCOUNTER — Ambulatory Visit: Payer: Medicare Other | Admitting: Family Medicine

## 2017-11-11 DIAGNOSIS — L97511 Non-pressure chronic ulcer of other part of right foot limited to breakdown of skin: Secondary | ICD-10-CM | POA: Diagnosis not present

## 2017-11-15 ENCOUNTER — Ambulatory Visit (INDEPENDENT_AMBULATORY_CARE_PROVIDER_SITE_OTHER): Payer: Medicare Other | Admitting: *Deleted

## 2017-11-15 DIAGNOSIS — Z7901 Long term (current) use of anticoagulants: Secondary | ICD-10-CM | POA: Diagnosis not present

## 2017-11-15 DIAGNOSIS — I482 Chronic atrial fibrillation, unspecified: Secondary | ICD-10-CM

## 2017-11-15 LAB — POCT INR: INR: 2.2

## 2017-11-15 NOTE — Patient Instructions (Signed)
Description   Continue 1 tablet daily except 1.5 tablets on Mondays.  Recheck in 3 weeks.  Call our office if you have any concerns or if you are put on any new medications [phone]

## 2017-11-20 ENCOUNTER — Ambulatory Visit (INDEPENDENT_AMBULATORY_CARE_PROVIDER_SITE_OTHER): Payer: Medicare Other | Admitting: Family Medicine

## 2017-11-20 ENCOUNTER — Encounter: Payer: Self-pay | Admitting: Family Medicine

## 2017-11-20 VITALS — BP 128/68 | HR 76 | Temp 97.7°F | Wt 179.0 lb

## 2017-11-20 DIAGNOSIS — I10 Essential (primary) hypertension: Secondary | ICD-10-CM | POA: Diagnosis not present

## 2017-11-20 DIAGNOSIS — E782 Mixed hyperlipidemia: Secondary | ICD-10-CM | POA: Diagnosis not present

## 2017-11-20 DIAGNOSIS — E039 Hypothyroidism, unspecified: Secondary | ICD-10-CM

## 2017-11-20 LAB — HEPATIC FUNCTION PANEL
ALK PHOS: 80 U/L (ref 39–117)
ALT: 13 U/L (ref 0–35)
AST: 19 U/L (ref 0–37)
Albumin: 4.6 g/dL (ref 3.5–5.2)
BILIRUBIN DIRECT: 0.3 mg/dL (ref 0.0–0.3)
BILIRUBIN TOTAL: 1.5 mg/dL — AB (ref 0.2–1.2)
Total Protein: 7.2 g/dL (ref 6.0–8.3)

## 2017-11-20 LAB — LIPID PANEL
CHOLESTEROL: 127 mg/dL (ref 0–200)
HDL: 38.8 mg/dL — ABNORMAL LOW (ref >39.00)
LDL Cholesterol: 68 mg/dL (ref 0–99)
NONHDL: 87.96
Total CHOL/HDL Ratio: 3
Triglycerides: 99 mg/dL (ref 0.0–149.0)
VLDL: 19.8 mg/dL (ref 0.0–40.0)

## 2017-11-20 LAB — BASIC METABOLIC PANEL
BUN: 28 mg/dL — ABNORMAL HIGH (ref 6–23)
CHLORIDE: 104 meq/L (ref 96–112)
CO2: 26 meq/L (ref 19–32)
CREATININE: 0.98 mg/dL (ref 0.40–1.20)
Calcium: 9.6 mg/dL (ref 8.4–10.5)
GFR: 57.1 mL/min — ABNORMAL LOW (ref >60.00)
GLUCOSE: 93 mg/dL (ref 70–99)
Potassium: 4.8 mEq/L (ref 3.5–5.1)
Sodium: 140 mEq/L (ref 135–145)

## 2017-11-20 NOTE — Progress Notes (Signed)
   Subjective:    Patient ID: [name], female    DOB: 02-06-1929, 83 y.o.   MRN: [id number]  HPI Here to follow up. She feels well. Her BP is stable. She moved into [place] assisted living in May and is very happy there.   Review of Systems  Constitutional: Negative.   Respiratory: Negative.   Cardiovascular: Negative.   Gastrointestinal: Negative.   Endocrine: Negative.   Neurological: Negative.        Objective:   Physical Exam  Constitutional: She is oriented to person, place, and time. She appears well-developed and well-nourished.  Neck: No thyromegaly present.  Cardiovascular: Normal rate and intact distal pulses.  Irregular rhythm. Has her usual 2/6 SM   Pulmonary/Chest: Effort normal and breath sounds normal. No respiratory distress. She has no wheezes. She has no rales.  Lymphadenopathy:    She has no cervical adenopathy.  Neurological: She is alert and oriented to person, place, and time.          Assessment & Plan:  Her HTN is stable. Get fasting labs to check her lipids and her electrolytes.  Alysia Penna, MD

## 2017-11-22 ENCOUNTER — Ambulatory Visit: Payer: Medicare Other | Admitting: Family Medicine

## 2017-11-22 LAB — TSH: TSH: 2.15 u[IU]/mL (ref 0.35–4.50)

## 2017-11-26 NOTE — Progress Notes (Signed)
Patient ID: [name], female   DOB: 18-Apr-1929, 84 y.o.   MRN: [id number]  [name] is seen today for f/U of afib, HTN, and edema. Her coumadin has been Rx. No palpitations, SOB or SSCP. She is walking on a regular basis without symptoms. She retired from [name] 4 years ago . Her husband [name] died and she is now living in apartment near [name] has worked well as a diuretic for edema as she requires much less K.   Admitted to hospital 04/02/17 with SBO Rx conservatively with NG tube  TSH suppressed at .266   I care for her sister [name] who had a stroke recently Her and husband [name] who is also a patient of mine moved into [name]   Echo:  04/17/16 reviewed stable mild AS  Study Conclusions  - Left ventricle: The cavity size was normal. Systolic function was   normal. The estimated ejection fraction was in the range of 60%   to 65%. Wall motion was normal; there were no regional wall   motion abnormalities. The study was not technically sufficient to   allow evaluation of LV diastolic dysfunction due to atrial   fibrillation. - Aortic valve: Severe diffuse thickening and calcification. Valve   mobility was restricted. There was mild stenosis. Mean gradient   (S): 13 mm Hg. Valve area (Vmax): 1.23 cm^2. - Mitral valve: Calcified annulus. Moderate diffuse thickening and   calcification of the anterior leaflet and posterior leaflet.   Mobility of the anterior leaflet was severely restricted.   Diastolic leaflet doming was present. The findings are consistent   with severe stenosis. There was mild to moderate regurgitation.   Mean gradient (D): 14 mm Hg. Valve area by pressure half-time:   1.33 cm^2. - Left atrium: The atrium was massively dilated. - Right ventricle: The cavity size was severely dilated. Wall   thickness was normal. - Right atrium: The atrium was severely dilated. - Pulmonary arteries: PA peak pressure: 56 mm Hg (S).  Left charcot foot  bothering her Sees [name] has been in boot before for it  Discussed DNR status and both agreed this is not appropriate for her     ROS: Denies fever, malais, weight loss, blurry vision, decreased visual acuity, cough, sputum, SOB, hemoptysis, pleuritic pain, palpitaitons, heartburn, abdominal pain, melena, lower extremity edema, claudication, or rash.  All other systems reviewed and negative  General: BP 132/80   Pulse (!) 59   Ht 5\' 11"  (1.803 m)   Wt 182 lb 4 oz (82.7 kg)   SpO2 98%   BMI 25.42 kg/m   Affect appropriate Healthy:  appears stated age 36: normal Neck supple with no adenopathy JVP normal no bruits no thyromegaly Lungs clear with no wheezing and good diaphragmatic motion Heart:  S1/S2 mild AS  murmur, no rub, gallop or click PMI normal Abdomen: benighn, BS positve, no tenderness, no AAA no bruit.  No HSM or HJR Distal pulses intact with no bruits No edema Neuro non-focal Varicose veins on inside left thigh  Charcot joint right foot no skin breakdown       Current Outpatient Medications  Medication Sig Dispense Refill  . ALPRAZolam (XANAX) 0.25 MG tablet Take 1 tablet (0.25 mg total) by mouth at bedtime as needed. for sleep 90 tablet 1  . atorvastatin (LIPITOR) 20 MG tablet Take 1 tablet (20 mg total) by mouth daily. 90 tablet 1  . Calcium Carbonate-Vitamin D (CALTRATE 600+D) 600-400 MG-UNIT per  tablet Take 1 tablet by mouth daily.      . carvedilol (COREG) 6.25 MG tablet Take 1 tablet (6.25 mg total) by mouth 2 (two) times daily. 180 tablet 3  . furosemide (LASIX) 40 MG tablet Take 1 tablet (40 mg total) by mouth daily. 90 tablet 1  . ketorolac (ACULAR) 0.5 % ophthalmic solution Place 1 drop into the right eye 2 (two) times daily.    Marland Kitchen levothyroxine (SYNTHROID, LEVOTHROID) 100 MCG tablet TAKE 1 TABLET DAILY 90 tablet 3  . meclizine (ANTIVERT) 25 MG tablet TAKE 1/2 TO 1 TABLET BY MOUTH EVERY 6 HOURS AS NEEDED FOR DIZZINESS 30 tablet 11  . meloxicam  (MOBIC) 7.5 MG tablet TAKE 1 TABLET DAILY 90 tablet 3  . Multiple Vitamins-Minerals (CENTRUM SILVER PO) Take 1 tablet by mouth daily.      . quinapril (ACCUPRIL) 20 MG tablet Take 1 tablet (20 mg total) by mouth daily. 90 tablet 1  . spironolactone (ALDACTONE) 25 MG tablet Take 1 tablet (25 mg total) by mouth daily. 90 tablet 3  . vitamin B-12 (CYANOCOBALAMIN) 1000 MCG tablet Take 1,000 mcg by mouth daily.      Marland Kitchen warfarin (COUMADIN) 5 MG tablet TAKE AS DIRECTED BY ANTICOAGULATION CLINIC 100 tablet 1  . potassium chloride SA (K-DUR,KLOR-CON) 20 MEQ tablet Take 1 tablet (20 mEq total) by mouth 2 (two) times daily. 180 tablet 3   No current facility-administered medications for this visit.     Allergies  Codeine  Electrocardiogram:  4/14  afib rate 64  Nonspecific ST/T wave changes  Today afib rate 63 nonspecific St changes similar to 2014  10/04/15  afib rate 62 nonspecific ST changes   Assessment and Plan Afib:  Chronic good rate control INR Rx no bleeding issues stable  Neuropathy:  F/u [name]   HTN:  Well controlled.  Continue current medications and low sodium Dash type diet.   Chol:  On statin  Cholesterol is at goal.  Continue current dose of statin and diet Rx.  No myalgias or side effects.  F/U  LFT's in 6 months. Lab Results  Component Value Date   LDLCALC 68 11/20/2017   AS: mild on echo 04/17/16  Mean gradient 13 mmHg   Will update echo    SBO:  Improved has outpatient f/u with Dr [name]  Thyroid:   Synthroid dose reduced  Lab Results  Component Value Date   TSH 2.15 11/20/2017  Ortho:  Charcot joint right  foot F/U [name] likely need another period of imobilizaiton          F/U with me in 6 months   [name]

## 2017-11-29 DIAGNOSIS — L97818 Non-pressure chronic ulcer of other part of right lower leg with other specified severity: Secondary | ICD-10-CM | POA: Diagnosis not present

## 2017-12-04 ENCOUNTER — Ambulatory Visit (INDEPENDENT_AMBULATORY_CARE_PROVIDER_SITE_OTHER): Payer: Medicare Other | Admitting: *Deleted

## 2017-12-04 ENCOUNTER — Ambulatory Visit (INDEPENDENT_AMBULATORY_CARE_PROVIDER_SITE_OTHER): Payer: Medicare Other | Admitting: Cardiovascular Disease

## 2017-12-04 ENCOUNTER — Encounter: Payer: Self-pay | Admitting: Cardiovascular Disease

## 2017-12-04 ENCOUNTER — Encounter (INDEPENDENT_AMBULATORY_CARE_PROVIDER_SITE_OTHER): Payer: Self-pay

## 2017-12-04 VITALS — BP 132/80 | HR 59 | Ht 71.0 in | Wt 182.2 lb

## 2017-12-04 DIAGNOSIS — I350 Nonrheumatic aortic (valve) stenosis: Secondary | ICD-10-CM | POA: Diagnosis not present

## 2017-12-04 DIAGNOSIS — E7849 Other hyperlipidemia: Secondary | ICD-10-CM | POA: Diagnosis not present

## 2017-12-04 DIAGNOSIS — I482 Chronic atrial fibrillation, unspecified: Secondary | ICD-10-CM

## 2017-12-04 DIAGNOSIS — Z7901 Long term (current) use of anticoagulants: Secondary | ICD-10-CM | POA: Diagnosis not present

## 2017-12-04 DIAGNOSIS — I10 Essential (primary) hypertension: Secondary | ICD-10-CM

## 2017-12-04 LAB — POCT INR: INR: 1.8

## 2017-12-04 MED ORDER — POTASSIUM CHLORIDE CRYS ER 20 MEQ PO TBCR: 20.0000 meq | EXTENDED_RELEASE_TABLET | Freq: Two times a day (BID) | ORAL | 3 refills | Status: DC

## 2017-12-04 NOTE — Patient Instructions (Addendum)

## 2017-12-04 NOTE — Patient Instructions (Signed)
Description   Today take 1.5 tablets, then Continue 1 tablet daily except 1.5 tablets on Mondays.  Recheck in 3 weeks.  Call our office if you have any concerns or if you are put on any new medications [phone]

## 2017-12-04 NOTE — Addendum Note (Signed)
Addended by: Aris Georgia, Olanna Percifield L on: 12/04/2017 10:43 AM   Modules accepted: Orders

## 2017-12-07 ENCOUNTER — Other Ambulatory Visit: Payer: Self-pay | Admitting: Cardiovascular Disease

## 2017-12-12 ENCOUNTER — Ambulatory Visit (HOSPITAL_COMMUNITY): Payer: Medicare Other | Attending: Cardiovascular Disease

## 2017-12-12 ENCOUNTER — Other Ambulatory Visit: Payer: Self-pay

## 2017-12-12 DIAGNOSIS — E785 Hyperlipidemia, unspecified: Secondary | ICD-10-CM | POA: Insufficient documentation

## 2017-12-12 DIAGNOSIS — R600 Localized edema: Secondary | ICD-10-CM | POA: Insufficient documentation

## 2017-12-12 DIAGNOSIS — I350 Nonrheumatic aortic (valve) stenosis: Secondary | ICD-10-CM

## 2017-12-12 DIAGNOSIS — I4891 Unspecified atrial fibrillation: Secondary | ICD-10-CM | POA: Insufficient documentation

## 2017-12-12 DIAGNOSIS — I10 Essential (primary) hypertension: Secondary | ICD-10-CM | POA: Diagnosis not present

## 2017-12-19 DIAGNOSIS — H31011 Macula scars of posterior pole (postinflammatory) (post-traumatic), right eye: Secondary | ICD-10-CM | POA: Diagnosis not present

## 2017-12-19 DIAGNOSIS — Z961 Presence of intraocular lens: Secondary | ICD-10-CM | POA: Diagnosis not present

## 2017-12-24 ENCOUNTER — Telehealth: Payer: Self-pay

## 2017-12-24 MED ORDER — ATORVASTATIN CALCIUM 20 MG PO TABS: 20.0000 mg | ORAL_TABLET | Freq: Every day | ORAL | 3 refills | Status: DC

## 2017-12-24 NOTE — Telephone Encounter (Signed)
Patient called and requested refill on Atorvastatin, RX sent to requested pharmacy.

## 2017-12-25 ENCOUNTER — Ambulatory Visit (INDEPENDENT_AMBULATORY_CARE_PROVIDER_SITE_OTHER): Payer: Medicare Other | Admitting: Pharmacist

## 2017-12-25 DIAGNOSIS — Z7901 Long term (current) use of anticoagulants: Secondary | ICD-10-CM

## 2017-12-25 DIAGNOSIS — I482 Chronic atrial fibrillation, unspecified: Secondary | ICD-10-CM

## 2017-12-25 LAB — POCT INR: INR: 2.5

## 2017-12-25 NOTE — Patient Instructions (Signed)
Description   Continue 1 tablet daily except 1.5 tablets on Mondays.  Recheck in 4 weeks.  Call our office if you have any concerns or if you are put on any new medications [phone]

## 2018-01-12 DIAGNOSIS — N390 Urinary tract infection, site not specified: Secondary | ICD-10-CM | POA: Diagnosis not present

## 2018-01-13 ENCOUNTER — Telehealth: Payer: Self-pay | Admitting: Family Medicine

## 2018-01-13 NOTE — Telephone Encounter (Signed)
Copied from [place]. Topic: Quick Communication - See Telephone Encounter >> Jan 13, 2018  9:18 AM Arletha Grippe wrote: CRM for notification. See Telephone encounter for:   01/13/18. [facility], wants to know if she needs to schedule follow up appt. Please call [phone] Pt would like call back from [facility]

## 2018-01-13 NOTE — Telephone Encounter (Signed)
Sir, please see below message. Please advise.

## 2018-01-14 ENCOUNTER — Ambulatory Visit (INDEPENDENT_AMBULATORY_CARE_PROVIDER_SITE_OTHER): Payer: Medicare Other | Admitting: Family Medicine

## 2018-01-14 ENCOUNTER — Encounter: Payer: Self-pay | Admitting: Family Medicine

## 2018-01-14 VITALS — BP 118/60 | HR 68 | Temp 98.4°F | Wt 184.2 lb

## 2018-01-14 DIAGNOSIS — J209 Acute bronchitis, unspecified: Secondary | ICD-10-CM | POA: Diagnosis not present

## 2018-01-14 DIAGNOSIS — N390 Urinary tract infection, site not specified: Secondary | ICD-10-CM

## 2018-01-14 LAB — POCT URINALYSIS DIPSTICK
BILIRUBIN UA: NEGATIVE
Blood, UA: NEGATIVE
Glucose, UA: NEGATIVE
KETONES UA: NEGATIVE
Leukocytes, UA: NEGATIVE
Nitrite, UA: NEGATIVE
PROTEIN UA: NEGATIVE
Spec Grav, UA: 1.015 (ref 1.010–1.025)
Urobilinogen, UA: 0.2 E.U./dL
pH, UA: 6 (ref 5.0–8.0)

## 2018-01-14 MED ORDER — AZITHROMYCIN 250 MG PO TABS: ORAL_TABLET | ORAL | 0 refills | Status: DC

## 2018-01-14 NOTE — Telephone Encounter (Signed)
She can follow up as needed, only if her symptoms have not resolved

## 2018-01-14 NOTE — Telephone Encounter (Signed)
Called and spoke with pt. Pt advised and stated that she has an appt today at 2 PM due to still NOT feeling better.

## 2018-01-14 NOTE — Progress Notes (Signed)
   Subjective:    Patient ID: [name], female    DOB: [birth year], 84 y.o.   MRN: [id number]  HPI Here for 2 days of low grade fevers, chest congestion and coughing up brown sputum. No SOB or chest pain. Also she went to a [facility] last weekend for urinary urgency and burning and she was diagnosed with a UTI. She was given Bactrim DS, and she has taken 4 days of this. She feels better now as far as urinary symptoms go.    Review of Systems  Constitutional: Positive for fever.  HENT: Negative.   Eyes: Negative.   Respiratory: Positive for cough and chest tightness. Negative for shortness of breath.   Cardiovascular: Negative.   Genitourinary: Negative.   Neurological: Negative.        Objective:   Physical Exam  Constitutional: She is oriented to person, place, and time. She appears well-developed and well-nourished.  HENT:  Right Ear: External ear normal.  Left Ear: External ear normal.  Nose: Nose normal.  Mouth/Throat: Oropharynx is clear and moist.  Eyes: Conjunctivae are normal.  Neck: No thyromegaly present.  Cardiovascular: Normal rate, regular rhythm, normal heart sounds and intact distal pulses.  Pulmonary/Chest: Effort normal. No respiratory distress. She has no wheezes. She has no rales.  Scattered rhonchi   Abdominal: Soft. Bowel sounds are normal. She exhibits no distension and no mass. There is no tenderness. There is no rebound and no guarding.  Lymphadenopathy:    She has no cervical adenopathy.  Neurological: She is alert and oriented to person, place, and time.          Assessment & Plan:  Her UTI has resolved. I told her to finish out 5 days of the Bactrim and then she can stop it. She also has a bronchitis now, so she will take a Zpack. Use Delsym prn.  Alysia Penna, MD

## 2018-01-15 ENCOUNTER — Ambulatory Visit (INDEPENDENT_AMBULATORY_CARE_PROVIDER_SITE_OTHER): Payer: Medicare Other

## 2018-01-15 DIAGNOSIS — I482 Chronic atrial fibrillation, unspecified: Secondary | ICD-10-CM

## 2018-01-15 DIAGNOSIS — Z7901 Long term (current) use of anticoagulants: Secondary | ICD-10-CM

## 2018-01-15 LAB — POCT INR: INR: 3.3

## 2018-01-15 NOTE — Patient Instructions (Signed)
Description   Skip today's dosage of Coumadin, then resume same dosage 1 tablet daily except 1.5 tablets on Mondays.  Recheck in 4 weeks.  Call our office if you have any concerns or if you are put on any new medications [phone]

## 2018-01-16 ENCOUNTER — Emergency Department (HOSPITAL_COMMUNITY): Payer: Medicare Other

## 2018-01-16 ENCOUNTER — Observation Stay (HOSPITAL_COMMUNITY)
Admission: EM | Admit: 2018-01-16 | Discharge: 2018-01-17 | Disposition: A | Discharge status: Home / Self Care | Payer: Medicare Other | Attending: Family Medicine | Admitting: Family Medicine

## 2018-01-16 ENCOUNTER — Encounter (HOSPITAL_COMMUNITY): Payer: Self-pay | Admitting: Emergency Medicine

## 2018-01-16 DIAGNOSIS — Z87440 Personal history of urinary (tract) infections: Secondary | ICD-10-CM | POA: Insufficient documentation

## 2018-01-16 DIAGNOSIS — I5030 Unspecified diastolic (congestive) heart failure: Secondary | ICD-10-CM

## 2018-01-16 DIAGNOSIS — Z791 Long term (current) use of non-steroidal anti-inflammatories (NSAID): Secondary | ICD-10-CM | POA: Diagnosis not present

## 2018-01-16 DIAGNOSIS — Z66 Do not resuscitate: Secondary | ICD-10-CM | POA: Insufficient documentation

## 2018-01-16 DIAGNOSIS — I739 Peripheral vascular disease, unspecified: Secondary | ICD-10-CM | POA: Diagnosis not present

## 2018-01-16 DIAGNOSIS — R0902 Hypoxemia: Secondary | ICD-10-CM | POA: Diagnosis present

## 2018-01-16 DIAGNOSIS — Z79890 Hormone replacement therapy (postmenopausal): Secondary | ICD-10-CM | POA: Diagnosis not present

## 2018-01-16 DIAGNOSIS — I872 Venous insufficiency (chronic) (peripheral): Secondary | ICD-10-CM | POA: Diagnosis present

## 2018-01-16 DIAGNOSIS — R9431 Abnormal electrocardiogram [ECG] [EKG]: Secondary | ICD-10-CM | POA: Diagnosis not present

## 2018-01-16 DIAGNOSIS — J209 Acute bronchitis, unspecified: Secondary | ICD-10-CM | POA: Diagnosis not present

## 2018-01-16 DIAGNOSIS — Z8542 Personal history of malignant neoplasm of other parts of uterus: Secondary | ICD-10-CM

## 2018-01-16 DIAGNOSIS — E538 Deficiency of other specified B group vitamins: Secondary | ICD-10-CM | POA: Diagnosis not present

## 2018-01-16 DIAGNOSIS — E039 Hypothyroidism, unspecified: Secondary | ICD-10-CM | POA: Diagnosis not present

## 2018-01-16 DIAGNOSIS — J80 Acute respiratory distress syndrome: Secondary | ICD-10-CM | POA: Diagnosis not present

## 2018-01-16 DIAGNOSIS — J22 Unspecified acute lower respiratory infection: Secondary | ICD-10-CM

## 2018-01-16 DIAGNOSIS — F419 Anxiety disorder, unspecified: Secondary | ICD-10-CM | POA: Diagnosis not present

## 2018-01-16 DIAGNOSIS — R05 Cough: Secondary | ICD-10-CM | POA: Diagnosis not present

## 2018-01-16 DIAGNOSIS — R0602 Shortness of breath: Secondary | ICD-10-CM | POA: Diagnosis not present

## 2018-01-16 DIAGNOSIS — Z79899 Other long term (current) drug therapy: Secondary | ICD-10-CM | POA: Insufficient documentation

## 2018-01-16 DIAGNOSIS — J40 Bronchitis, not specified as acute or chronic: Secondary | ICD-10-CM | POA: Diagnosis not present

## 2018-01-16 DIAGNOSIS — N390 Urinary tract infection, site not specified: Secondary | ICD-10-CM | POA: Diagnosis not present

## 2018-01-16 DIAGNOSIS — Z7901 Long term (current) use of anticoagulants: Secondary | ICD-10-CM | POA: Diagnosis not present

## 2018-01-16 DIAGNOSIS — I700 Atherosclerosis of aorta: Secondary | ICD-10-CM | POA: Insufficient documentation

## 2018-01-16 DIAGNOSIS — R110 Nausea: Secondary | ICD-10-CM | POA: Diagnosis not present

## 2018-01-16 DIAGNOSIS — I5031 Acute diastolic (congestive) heart failure: Secondary | ICD-10-CM | POA: Diagnosis not present

## 2018-01-16 DIAGNOSIS — I10 Essential (primary) hypertension: Secondary | ICD-10-CM | POA: Diagnosis present

## 2018-01-16 DIAGNOSIS — I4891 Unspecified atrial fibrillation: Secondary | ICD-10-CM | POA: Diagnosis present

## 2018-01-16 DIAGNOSIS — Z9049 Acquired absence of other specified parts of digestive tract: Secondary | ICD-10-CM | POA: Insufficient documentation

## 2018-01-16 DIAGNOSIS — Z85828 Personal history of other malignant neoplasm of skin: Secondary | ICD-10-CM | POA: Diagnosis not present

## 2018-01-16 DIAGNOSIS — Z85038 Personal history of other malignant neoplasm of large intestine: Secondary | ICD-10-CM

## 2018-01-16 DIAGNOSIS — E785 Hyperlipidemia, unspecified: Secondary | ICD-10-CM | POA: Diagnosis not present

## 2018-01-16 DIAGNOSIS — I110 Hypertensive heart disease with heart failure: Secondary | ICD-10-CM | POA: Insufficient documentation

## 2018-01-16 LAB — CBC WITH DIFFERENTIAL/PLATELET
BASOS PCT: 0 %
Basophils Absolute: 0 10*3/uL (ref 0.0–0.1)
EOS ABS: 0 10*3/uL (ref 0.0–0.7)
Eosinophils Relative: 0 %
HEMATOCRIT: 35.1 % — AB (ref 36.0–46.0)
HEMOGLOBIN: 11.5 g/dL — AB (ref 12.0–15.0)
LYMPHS ABS: 0.7 10*3/uL (ref 0.7–4.0)
Lymphocytes Relative: 13 %
MCH: 30.9 pg (ref 26.0–34.0)
MCHC: 32.8 g/dL (ref 30.0–36.0)
MCV: 94.4 fL (ref 78.0–100.0)
MONO ABS: 0.4 10*3/uL (ref 0.1–1.0)
Monocytes Relative: 8 %
NEUTROS ABS: 4.1 10*3/uL (ref 1.7–7.7)
NEUTROS PCT: 79 %
Platelets: 187 10*3/uL (ref 150–400)
RBC: 3.72 MIL/uL — ABNORMAL LOW (ref 3.87–5.11)
RDW: 13.2 % (ref 11.5–15.5)
WBC: 5.2 10*3/uL (ref 4.0–10.5)

## 2018-01-16 LAB — BASIC METABOLIC PANEL
Anion gap: 10 (ref 5–15)
BUN: 22 mg/dL — AB (ref 6–20)
CALCIUM: 9.2 mg/dL (ref 8.9–10.3)
CHLORIDE: 101 mmol/L (ref 101–111)
CO2: 21 mmol/L — AB (ref 22–32)
Creatinine, Ser: 1.38 mg/dL — ABNORMAL HIGH (ref 0.44–1.00)
GFR calc non Af Amer: 34 mL/min — ABNORMAL LOW (ref >60)
GFR, EST AFRICAN AMERICAN: 39 mL/min — AB (ref >60)
Glucose, Bld: 98 mg/dL (ref 65–99)
Potassium: 5.1 mmol/L (ref 3.5–5.1)
Sodium: 132 mmol/L — ABNORMAL LOW (ref 135–145)

## 2018-01-16 LAB — URINALYSIS, ROUTINE W REFLEX MICROSCOPIC
BACTERIA UA: NONE SEEN
Bilirubin Urine: NEGATIVE
Glucose, UA: NEGATIVE mg/dL
Ketones, ur: 5 mg/dL — AB
Leukocytes, UA: NEGATIVE
Nitrite: NEGATIVE
PH: 6 (ref 5.0–8.0)
Protein, ur: NEGATIVE mg/dL
SPECIFIC GRAVITY, URINE: 1.006 (ref 1.005–1.030)

## 2018-01-16 LAB — INFLUENZA PANEL BY PCR (TYPE A & B)
INFLAPCR: NEGATIVE
INFLBPCR: NEGATIVE

## 2018-01-16 LAB — I-STAT CG4 LACTIC ACID, ED: LACTIC ACID, VENOUS: 0.97 mmol/L (ref 0.5–1.9)

## 2018-01-16 LAB — PROTIME-INR
INR: 3
PROTHROMBIN TIME: 30.9 s — AB (ref 11.4–15.2)

## 2018-01-16 LAB — BRAIN NATRIURETIC PEPTIDE: B NATRIURETIC PEPTIDE 5: 283.4 pg/mL — AB (ref 0.0–100.0)

## 2018-01-16 MED ORDER — MELOXICAM 7.5 MG PO TABS
7.5000 mg | ORAL_TABLET | Freq: Every day | ORAL | Status: DC
  Administered 2018-01-16 – 2018-01-17 (×2): 7.5 mg via ORAL
  Filled 2018-01-16 (×2): qty 1

## 2018-01-16 MED ORDER — VANCOMYCIN HCL 10 G IV SOLR
1250.0000 mg | Freq: Once | INTRAVENOUS | Status: AC
  Administered 2018-01-16: 1250 mg via INTRAVENOUS
  Filled 2018-01-16: qty 1250

## 2018-01-16 MED ORDER — IPRATROPIUM-ALBUTEROL 0.5-2.5 (3) MG/3ML IN SOLN
3.0000 mL | Freq: Once | RESPIRATORY_TRACT | Status: AC
  Administered 2018-01-16: 3 mL via RESPIRATORY_TRACT
  Filled 2018-01-16: qty 3

## 2018-01-16 MED ORDER — LEVOTHYROXINE SODIUM 100 MCG PO TABS
100.0000 ug | ORAL_TABLET | Freq: Every day | ORAL | Status: DC
  Administered 2018-01-16 – 2018-01-17 (×2): 100 ug via ORAL
  Filled 2018-01-16 (×2): qty 1

## 2018-01-16 MED ORDER — VANCOMYCIN HCL IN DEXTROSE 1-5 GM/200ML-% IV SOLN: 1000.0000 mg | Freq: Once | INTRAVENOUS | Status: DC

## 2018-01-16 MED ORDER — ATORVASTATIN CALCIUM 20 MG PO TABS
20.0000 mg | ORAL_TABLET | Freq: Every day | ORAL | Status: DC
  Administered 2018-01-16 – 2018-01-17 (×2): 20 mg via ORAL
  Filled 2018-01-16 (×2): qty 1

## 2018-01-16 MED ORDER — VITAMIN B-12 1000 MCG PO TABS
1000.0000 ug | ORAL_TABLET | Freq: Every day | ORAL | Status: DC
  Administered 2018-01-16 – 2018-01-17 (×2): 1000 ug via ORAL
  Filled 2018-01-16 (×2): qty 1

## 2018-01-16 MED ORDER — ACETAMINOPHEN 325 MG PO TABS
650.0000 mg | ORAL_TABLET | Freq: Four times a day (QID) | ORAL | Status: DC | PRN
  Administered 2018-01-16: 650 mg via ORAL
  Filled 2018-01-16: qty 2

## 2018-01-16 MED ORDER — ACETAMINOPHEN 650 MG RE SUPP: 650.0000 mg | Freq: Four times a day (QID) | RECTAL | Status: DC | PRN

## 2018-01-16 MED ORDER — SODIUM CHLORIDE 0.9 % IV BOLUS (SEPSIS): 500.0000 mL | Freq: Once | INTRAVENOUS | Status: DC

## 2018-01-16 MED ORDER — SULFAMETHOXAZOLE-TRIMETHOPRIM 800-160 MG PO TABS
1.0000 | ORAL_TABLET | Freq: Two times a day (BID) | ORAL | Status: DC
  Administered 2018-01-16 (×2): 1 via ORAL
  Filled 2018-01-16 (×2): qty 1

## 2018-01-16 MED ORDER — CALCIUM CARBONATE-VITAMIN D 500-200 MG-UNIT PO TABS
1.0000 | ORAL_TABLET | Freq: Every day | ORAL | Status: DC
  Administered 2018-01-16 – 2018-01-17 (×2): 1 via ORAL
  Filled 2018-01-16 (×2): qty 1

## 2018-01-16 MED ORDER — ACETAMINOPHEN 325 MG PO TABS
650.0000 mg | ORAL_TABLET | Freq: Once | ORAL | Status: AC
  Administered 2018-01-16: 650 mg via ORAL
  Filled 2018-01-16: qty 2

## 2018-01-16 MED ORDER — PROSIGHT PO TABS
1.0000 | ORAL_TABLET | Freq: Every day | ORAL | Status: DC
  Administered 2018-01-16 – 2018-01-17 (×2): 1 via ORAL
  Filled 2018-01-16 (×2): qty 1

## 2018-01-16 MED ORDER — VANCOMYCIN HCL 10 G IV SOLR: 1250.0000 mg | INTRAVENOUS | Status: DC

## 2018-01-16 MED ORDER — ALBUTEROL SULFATE (2.5 MG/3ML) 0.083% IN NEBU: 2.5000 mg | INHALATION_SOLUTION | RESPIRATORY_TRACT | Status: DC | PRN

## 2018-01-16 MED ORDER — ALPRAZOLAM 0.25 MG PO TABS: 0.2500 mg | ORAL_TABLET | Freq: Every evening | ORAL | Status: DC | PRN

## 2018-01-16 MED ORDER — ONDANSETRON HCL 4 MG/2ML IJ SOLN
4.0000 mg | Freq: Four times a day (QID) | INTRAMUSCULAR | Status: DC | PRN
  Administered 2018-01-16: 4 mg via INTRAVENOUS
  Filled 2018-01-16: qty 2

## 2018-01-16 MED ORDER — PIPERACILLIN-TAZOBACTAM 3.375 G IVPB 30 MIN
3.3750 g | Freq: Once | INTRAVENOUS | Status: AC
  Administered 2018-01-16: 3.375 g via INTRAVENOUS
  Filled 2018-01-16: qty 50

## 2018-01-16 MED ORDER — QUINAPRIL HCL 10 MG PO TABS
20.0000 mg | ORAL_TABLET | Freq: Every day | ORAL | Status: DC
  Administered 2018-01-16: 20 mg via ORAL
  Filled 2018-01-16 (×2): qty 2

## 2018-01-16 MED ORDER — WARFARIN - PHYSICIAN DOSING INPATIENT
Freq: Every day | Status: DC
  Administered 2018-01-16: 19:00:00

## 2018-01-16 MED ORDER — SODIUM CHLORIDE 0.9 % IV BOLUS (SEPSIS)
500.0000 mL | Freq: Once | INTRAVENOUS | Status: AC
  Administered 2018-01-16: 500 mL via INTRAVENOUS

## 2018-01-16 MED ORDER — POTASSIUM CHLORIDE IN NACL 20-0.9 MEQ/L-% IV SOLN
INTRAVENOUS | Status: AC
  Administered 2018-01-16: 15:00:00 via INTRAVENOUS
  Filled 2018-01-16: qty 1000

## 2018-01-16 MED ORDER — ONDANSETRON HCL 4 MG PO TABS: 4.0000 mg | ORAL_TABLET | Freq: Four times a day (QID) | ORAL | Status: DC | PRN

## 2018-01-16 MED ORDER — SODIUM CHLORIDE 0.9 % IV BOLUS (SEPSIS): 1000.0000 mL | Freq: Once | INTRAVENOUS | Status: DC

## 2018-01-16 MED ORDER — PIPERACILLIN-TAZOBACTAM 3.375 G IVPB: 3.3750 g | Freq: Three times a day (TID) | INTRAVENOUS | Status: DC

## 2018-01-16 MED ORDER — KETOROLAC TROMETHAMINE 0.5 % OP SOLN
1.0000 [drp] | Freq: Two times a day (BID) | OPHTHALMIC | Status: DC
  Administered 2018-01-16 – 2018-01-17 (×3): 1 [drp] via OPHTHALMIC
  Filled 2018-01-16: qty 3

## 2018-01-16 MED ORDER — AZITHROMYCIN 250 MG PO TABS
500.0000 mg | ORAL_TABLET | Freq: Every day | ORAL | Status: DC
  Administered 2018-01-16: 500 mg via ORAL
  Filled 2018-01-16 (×2): qty 2

## 2018-01-16 MED ORDER — WARFARIN SODIUM 5 MG PO TABS
5.0000 mg | ORAL_TABLET | Freq: Every day | ORAL | Status: DC
  Administered 2018-01-16: 5 mg via ORAL
  Filled 2018-01-16: qty 1

## 2018-01-16 MED ORDER — OXYCODONE HCL 5 MG PO TABS: 5.0000 mg | ORAL_TABLET | ORAL | Status: DC | PRN

## 2018-01-16 MED ORDER — SENNOSIDES-DOCUSATE SODIUM 8.6-50 MG PO TABS: 1.0000 | ORAL_TABLET | Freq: Every evening | ORAL | Status: DC | PRN

## 2018-01-16 MED ORDER — CARVEDILOL 12.5 MG PO TABS
6.2500 mg | ORAL_TABLET | Freq: Two times a day (BID) | ORAL | Status: DC
  Administered 2018-01-16 – 2018-01-17 (×3): 6.25 mg via ORAL
  Filled 2018-01-16 (×3): qty 1

## 2018-01-16 MED ORDER — SPIRONOLACTONE 25 MG PO TABS
25.0000 mg | ORAL_TABLET | Freq: Every day | ORAL | Status: DC
  Administered 2018-01-16: 25 mg via ORAL
  Filled 2018-01-16 (×2): qty 1

## 2018-01-16 NOTE — Progress Notes (Signed)
Pharmacy Antibiotic Note  [name] is a 84 y.o. female admitted on 01/16/2018 with sepsis.  Pharmacy has been consulted for vancomycin and zosyn dosing. Renal insufficiency noted with sCr 1.38 with CrCl 40ml/min.  Vancomycin trough goal 15-20  Plan: 1) Vancomycin 1250mg  IV q24 2) Zosyn 3.375g IV q8 (4h infusion) 3) Follow renal function, cultures, LOT, level if needed     Temp (24hrs), Avg:100.5 F (38.1 C), Min:99 F (37.2 C), Max:101.9 F (38.8 C)  Recent Labs  Lab 01/16/18 0724 01/16/18 0829  WBC 5.2  --   CREATININE 1.38*  --   LATICACIDVEN  --  0.97    Estimated Creatinine Clearance: 32.7 mL/min (A) (by C-G formula based on SCr of 1.38 mg/dL (H)).    Allergies  Allergen Reactions  . Codeine Nausea Only    REACTION: nausea    Antimicrobials this admission: 2/21 Vancomycin >> 2/21 Zosyn >>  Dose adjustments this admission: n/a  Microbiology results: 2/21 blood>> 2/21 urine>> 2/21 flu panel >>  Thank you for allowing pharmacy to be a part of this patient's care.  Deboraha Sprang 01/16/2018 8:36 AM

## 2018-01-16 NOTE — ED Notes (Signed)
Attempted to call report x 1  

## 2018-01-16 NOTE — ED Triage Notes (Signed)
Patient arrived with EMS from an independent living facility , reports diagnosed with UTI and Bronchitis this week by PCP currently taking oral antibiotic/Tamiflu with no improvement , fatigue /productive cough and chest congestion .

## 2018-01-16 NOTE — ED Provider Notes (Signed)
[name] Provider Note   CSN: [id number] Arrival date & time: 01/16/18  0536     History   Chief Complaint Chief Complaint  Patient presents with  . Cough/UTI    HPI [name] is a 84 y.o. female.  HPI [name] is a 84 y.o. female with hx of afib, presents to ED with complaint of fever, chills, cough, dysuria. Pt states she began having dysuria and myalgias 4 days ago. [facility]. States placed on bactrim. Pt states symptoms were worsening, and then developed congestion and cough. Was seen by PCP 2 days ago, diagnosed with brochitis, started on zpack which she is still taking. States she is not improving. Continues to have cough, myalgias, fever (99.4 today), nausea, dysuria. Denies headache. No neck pain or stiffness. Had one episode of diarrhea two days ago otherwise normal bowel movements. Reports decreased appetite due to nausea. No vomiting. No other medications take for her symptoms.   Past Medical History:  Diagnosis Date  . Anxiety   . Atrial fibrillation ([place])   . Cancer ([place])   . Cataract    REMOVED BILATERAL  . Chest pain, unspecified   . Chronic low back pain   . Diverticulosis of colon   . History of colon cancer   . History of colonic polyps   . History of colonoscopy   . History of uterine cancer   . Hyperlipidemia   . Hypertension   . Hypothyroid   . Impaired glucose tolerance   . Mitral valve disorder   . Osteoarthritis   . Osteoporosis   . Patent foramen ovale   . Peripheral neuropathy   . Peripheral vascular disease ([place])   . Splenic infarction   . UTI (lower urinary tract infection)   . Venous insufficiency   . Vitamin B12 deficiency     Patient Active Problem List   Diagnosis Date Noted  . SBO (small bowel obstruction) ([place]) 04/02/2017  . CAP (community acquired pneumonia) 02/24/2016  . Fever blister 02/24/2016  . Bilateral pneumonia 02/17/2016  .  Acute respiratory failure with hypercapnia ([place]) 02/17/2016  . Acute diastolic heart failure ([place])   . Dehydration 12/15/2015  . Sepsis ([place]) 12/15/2015  . Enteritis 12/15/2015  . Diarrhea   . Charcot's joint of left foot 11/24/2015  . Vasculitis ([place]) 03/11/2015  . Aortic stenosis 09/14/2013  . Long term current use of anticoagulant 01/09/2011  . COLONIC POLYPS 10/29/2010  . DIVERTICULOSIS OF COLON 10/29/2010  . ABNORMAL CV (STRESS) TEST 04/20/2010  . CHEST PAIN UNSPECIFIED 04/17/2010  . VITAMIN B12 DEFICIENCY 12/23/2008  . IMPAIRED GLUCOSE TOLERANCE 12/23/2008  . PERIPHERAL NEUROPATHY 06/22/2008  . SPLENIC INFARCTION 02/20/2008  . Anxiety state 02/20/2008  . Moderate mitral regurgitation 02/20/2008  . PERIPHERAL VASCULAR DISEASE 02/20/2008  . Venous (peripheral) insufficiency 02/20/2008  . URINARY TRACT INFECTION 02/20/2008  . Osteoarthritis 02/20/2008  . OSTEOPOROSIS 02/20/2008  . UTERINE CANCER, HX OF 02/20/2008  . Malignant neoplasm of colon ([place]) 01/13/2008  . Hypothyroidism 01/13/2008  . Hyperlipemia 01/13/2008  . Essential hypertension 01/13/2008  . ATRIAL FIBRILLATION 01/13/2008  . LOW BACK PAIN, CHRONIC 01/13/2008    Past Surgical History:  Procedure Laterality Date  . ANTERIOR AND POSTERIOR VAGINAL REPAIR  03/2006   Dr.Neal  . basal cell skin cancer Moh's surgery    . CATARACT EXTRACTION, BILATERAL    . COLONOSCOPY  05-25-10   per Dr. Henrene Pastor, benign polyps, repeat  in 5 yrs   . EYE SURGERY    . hysterectomy for uterine cancer    . left carpal tunnel surgery  02/2007   Dr. Daylene Katayama  . lumbar laminectomy for spinal stenosis  2006   L3-4  . RETINAL DETACHMENT REPAIR W/ SCLERAL BUCKLE LE Right 03-01-14   per Dr. Zadie Rhine   . right hemicolectomy for colon cancer  1994    OB History    No data available       Home Medications    Prior to Admission medications   Medication Sig Start Date End Date Taking? Authorizing Provider  ALPRAZolam (XANAX) 0.25 MG tablet  Take 1 tablet (0.25 mg total) by mouth at bedtime as needed. for sleep 09/05/17   Laurey Morale, MD  atorvastatin (LIPITOR) 20 MG tablet Take 1 tablet (20 mg total) by mouth daily. 12/24/17   Josue Hector, MD  azithromycin (ZITHROMAX) 250 MG tablet As directed 01/14/18   Laurey Morale, MD  Calcium Carbonate-Vitamin D (CALTRATE 600+D) 600-400 MG-UNIT per tablet Take 1 tablet by mouth daily.      [provider]  carvedilol (COREG) 6.25 MG tablet Take 1 tablet (6.25 mg total) by mouth 2 (two) times daily. 05/20/17   Josue Hector, MD  furosemide (LASIX) 40 MG tablet Take 1 tablet (40 mg total) by mouth daily. 08/26/17   Josue Hector, MD  ketorolac (ACULAR) 0.5 % ophthalmic solution Place 1 drop into the right eye 2 (two) times daily. 03/17/15   [provider]  levothyroxine (SYNTHROID, LEVOTHROID) 100 MCG tablet TAKE 1 TABLET DAILY 08/26/17   Laurey Morale, MD  meclizine (ANTIVERT) 25 MG tablet TAKE 1/2 TO 1 TABLET BY MOUTH EVERY 6 HOURS AS NEEDED FOR DIZZINESS 12/19/16   Laurey Morale, MD  meloxicam (MOBIC) 7.5 MG tablet TAKE 1 TABLET DAILY 08/26/17   Laurey Morale, MD  Multiple Vitamins-Minerals (CENTRUM SILVER PO) Take 1 tablet by mouth daily.      [provider]  potassium chloride SA (K-DUR,KLOR-CON) 20 MEQ tablet TAKE 1 TABLET TWICE A DAY 12/09/17   Josue Hector, MD  quinapril (ACCUPRIL) 20 MG tablet Take 1 tablet (20 mg total) by mouth daily. 06/10/17   Josue Hector, MD  spironolactone (ALDACTONE) 25 MG tablet Take 1 tablet (25 mg total) by mouth daily. 04/08/17   Josue Hector, MD  sulfamethoxazole-trimethoprim (BACTRIM DS,SEPTRA DS) 800-160 MG tablet TAKE 1 TABLET BY MOUTH EVERY 12 HOURS FOR 10 DAYS 01/12/18   [provider]  vitamin B-12 (CYANOCOBALAMIN) 1000 MCG tablet Take 1,000 mcg by mouth daily.      [provider]  warfarin (COUMADIN) 5 MG tablet TAKE AS DIRECTED BY ANTICOAGULATION CLINIC 08/12/17   Josue Hector, MD     Family History Family History  Problem Relation Age of Onset  . Cancer Mother        deceased age 61  . Heart failure Father        deceased age 4  . Cancer Brother        deceased age 87  . Stroke Sister        and heart problems; deceased age 49  . Heart disease Sister     Social History Social History   Tobacco Use  . Smoking status: Never Smoker  . Smokeless tobacco: Never Used  Substance Use Topics  . Alcohol use: Yes    Alcohol/week: 0.0 oz    Comment: occ  .  Drug use: No     Allergies   Codeine   Review of Systems Review of Systems  Constitutional: Positive for chills and fever.  HENT: Positive for congestion.   Respiratory: Positive for chest tightness and shortness of breath. Negative for cough.   Cardiovascular: Negative for chest pain, palpitations and leg swelling.  Gastrointestinal: Positive for diarrhea and nausea. Negative for abdominal pain and vomiting.  Genitourinary: Negative for dysuria, flank pain, pelvic pain, vaginal bleeding, vaginal discharge and vaginal pain.  Musculoskeletal: Negative for arthralgias, myalgias, neck pain and neck stiffness.  Skin: Negative for rash.  Neurological: Negative for dizziness, weakness and headaches.  All other systems reviewed and are negative.    Physical Exam Updated Vital Signs BP 128/77   Pulse (!) 106   Temp 99 F (37.2 C) (Oral)   Resp 20   SpO2 99%   Physical Exam  Constitutional: She is oriented to person, place, and time. She appears well-developed and well-nourished. No distress.  HENT:  Head: Normocephalic.  Eyes: Conjunctivae are normal.  Neck: Neck supple.  Cardiovascular: Normal rate and regular rhythm.  Murmur heard. Pulmonary/Chest: Effort normal and breath sounds normal. No respiratory distress. She has no wheezes. She has no rales.  Abdominal: Soft. Bowel sounds are normal. She exhibits no distension. There is no tenderness. There is no rebound.  Musculoskeletal: She  exhibits no edema.  Neurological: She is alert and oriented to person, place, and time.  Skin: Skin is warm and dry.  Psychiatric: She has a normal mood and affect. Her behavior is normal.  Nursing note and vitals reviewed.    ED Treatments / Results  Labs (all labs ordered are listed, but only abnormal results are displayed) Labs Reviewed  CBC WITH DIFFERENTIAL/PLATELET - Abnormal; Notable for the following components:      Result Value   RBC 3.72 (*)    Hemoglobin 11.5 (*)    HCT 35.1 (*)    All other components within normal limits  BASIC METABOLIC PANEL - Abnormal; Notable for the following components:   Sodium 132 (*)    CO2 21 (*)    BUN 22 (*)    Creatinine, Ser 1.38 (*)    GFR calc non Af Amer 34 (*)    GFR calc Af Amer 39 (*)    All other components within normal limits  URINALYSIS, ROUTINE W REFLEX MICROSCOPIC - Abnormal; Notable for the following components:   Color, Urine STRAW (*)    Hgb urine dipstick SMALL (*)    Ketones, ur 5 (*)    Squamous Epithelial / LPF 0-5 (*)    All other components within normal limits  PROTIME-INR - Abnormal; Notable for the following components:   Prothrombin Time 30.9 (*)    All other components within normal limits  BRAIN NATRIURETIC PEPTIDE - Abnormal; Notable for the following components:   B Natriuretic Peptide 283.4 (*)    All other components within normal limits  URINE CULTURE  CULTURE, BLOOD (ROUTINE X 2)  CULTURE, BLOOD (ROUTINE X 2)  CULTURE, EXPECTORATED SPUTUM-ASSESSMENT  INFLUENZA PANEL BY PCR (TYPE A & B)  I-STAT CG4 LACTIC ACID, ED    EKG  EKG Interpretation  Date/Time:  Thursday January 16 2018 07:36:55 EST Ventricular Rate:  88 PR Interval:    QRS Duration: 84 QT Interval:  354 QTC Calculation: 428 R Axis:   82 Text Interpretation:  Atrial fibrillation Nonspecific T wave abnormality Abnormal ECG Since last EKG, rate is increased Otherwise no  significant change Confirmed by Duffy Bruce [phone]) on  01/16/2018 7:56:21 AM       Radiology Dg Chest 2 View  Result Date: 01/16/2018 CLINICAL DATA:  Cough, fever, and shortness of breath. EXAM: CHEST  2 VIEW COMPARISON:  02/18/2016 FINDINGS: The heart is enlarged. There is aortic atherosclerosis. Diffusely increased interstitial markings and peribronchial cuffing consistent pulmonary edema. Fluid in the fissures without large subpulmonic effusion. No confluent airspace disease. No pneumothorax. Bones are under mineralized. No acute osseous abnormalities. IMPRESSION: 1. Cardiomegaly with peribronchial cuffing and increased interstitial opacities suspicious for pulmonary edema. Acute bronchitis could have a similar appearance. 2. Fluid in the fissures without large subpulmonic effusion. Electronically Signed   By: Jeb Levering M.D.   On: 01/16/2018 06:54    Procedures Procedures (including critical care time)  Medications Ordered in ED Medications  sodium chloride 0.9 % bolus 1,000 mL (not administered)     Initial Impression / Assessment and Plan / ED Course  I have reviewed the triage vital signs and the nursing notes.  Pertinent labs & imaging results that were available during my care of the patient were reviewed by me and considered in my medical decision making (see chart for details).     Pt in ED with flu like symptoms and dysuria. Mildly tachycardic, otherwise normal VS. Will check basic labs. Urine. CXR. Will give iv fluids.   8:31 AM Patient initially afebrile.  Recheck of temperature rectally is 101.9.  Patient is tachycardic.  She meets Sirs criteria.  I will start her on antibiotics, blood cultures and lactic acid added.  I will give her fluids and 500 cc boluses given history of CHF and right heart failure.  At this time she received 1 bolus of 500 cc, will add another one and reassess her.  Patient updated on the plan and she agrees.  10:51 AM Influenza panel negative. Symptoms most likely caused by viral bronchitis?  She did meet sirs criteria and was made code sepsis, however, lactic acid and WBC normal. Doubt Sepsis.   Spoke with hospitalist, will admit.   Final Clinical Impressions(s) / ED Diagnoses   Final diagnoses:  Bronchitis    ED Discharge Orders    None       Jeannett Senior, PA-C 01/16/18 1509    Duffy Bruce, MD 01/17/18 1434

## 2018-01-16 NOTE — H&P (Signed)
History and Physical    [name] VFI:[id number] DOB: Oct 11, 1929 DOA: 01/16/2018  PCP: Laurey Morale, MD  Patient coming from: Home   Chief Complaint: Shortness of breath with possible pneumonia  HPI: [name] is a 84 y.o. female with medical history significant for congestive heart failure, hypothyroidism, atrial fibrillation on warfarin and peripheral vascular disease who has had flulike symptoms for several days.  She had been feeling poorly over the weekend and on Sunday decided to go to urgent care.  She was diagnosed with a urinary tract infection and started on TMP sulfa.  On Monday she felt a little bit better but on Tuesday began somewhat short of breath.  Went to see her primary care physician Dr. Sarajane Jews who prescribed a Z-Pak for a bronchitis.  She has not improved since taking her Z-Pak and continues to have a cough muscle aches, fevers nausea, dysuria and generalized weakness.  She denies a headache but has had no vomiting.  She has no neck pain or stiffness she did have one episode of diarrhea 2 days ago but otherwise has had normal bowel movements.  She has had a poor appetite due to her nausea.  Not taken any additional medications for her symptoms.  ED Course: Hypoxemic at 89% with tachycardia and fever of 101.9. Chest x-ray showed vascular cuffing consistent with CHF versus bronchitis. Physical examination she had a tight chest and she was short of breath and somewhat congested Due to her fever and heart rate code sepsis was called however her lactic acid was normal and is not felt that the patient is septic at this time.  Did receive 1 L of fluid bolus with improvement in a soft blood pressure.  Influenza panel was negative.  Review of Systems: As per HPI otherwise remainder of systems negative.    Past Medical History:  Diagnosis Date  . Anxiety   . Atrial fibrillation ([place])   . Cancer ([place])   . Cataract    REMOVED BILATERAL  . Chest pain, unspecified   .  Chronic low back pain   . Diverticulosis of colon   . History of colon cancer   . History of colonic polyps   . History of colonoscopy   . History of uterine cancer   . Hyperlipidemia   . Hypertension   . Hypothyroid   . Impaired glucose tolerance   . Mitral valve disorder   . Osteoarthritis   . Osteoporosis   . Patent foramen ovale   . Peripheral neuropathy   . Peripheral vascular disease ([place])   . Splenic infarction   . UTI (lower urinary tract infection)   . Venous insufficiency   . Vitamin B12 deficiency     Past Surgical History:  Procedure Laterality Date  . ANTERIOR AND POSTERIOR VAGINAL REPAIR  03/2006   Dr.Neal  . basal cell skin cancer Moh's surgery    . CATARACT EXTRACTION, BILATERAL    . COLONOSCOPY  05-25-10   per Dr. Henrene Pastor, benign polyps, repeat in 5 yrs   . EYE SURGERY    . hysterectomy for uterine cancer    . left carpal tunnel surgery  02/2007   Dr. Daylene Katayama  . lumbar laminectomy for spinal stenosis  2006   L3-4  . RETINAL DETACHMENT REPAIR W/ SCLERAL BUCKLE LE Right 03-01-14   per Dr. Zadie Rhine   . right hemicolectomy for colon cancer  1994     reports that  has never smoked. she has never used  smokeless tobacco. She reports that she drinks alcohol. She reports that she does not use drugs.  Allergies  Allergen Reactions  . Codeine Nausea Only    REACTION: nausea    Family History  Problem Relation Age of Onset  . Cancer Mother        deceased age 51  . Heart failure Father        deceased age 45  . Cancer Brother        deceased age 15  . Stroke Sister        and heart problems; deceased age 37  . Heart disease Sister      Prior to Admission medications   Medication Sig Start Date End Date Taking? Authorizing Provider  ALPRAZolam (XANAX) 0.25 MG tablet Take 1 tablet (0.25 mg total) by mouth at bedtime as needed. for sleep 09/05/17  Yes Laurey Morale, MD  atorvastatin (LIPITOR) 20 MG tablet Take 1 tablet (20 mg total) by mouth daily. 12/24/17   Yes Josue Hector, MD  azithromycin (ZITHROMAX) 250 MG tablet As directed Patient taking differently: Take 250-500 mg by mouth See admin instructions. Take 2 tablets on day 1 then take 1 tablet on days 1 through 5 01/14/18  Yes Laurey Morale, MD  Calcium Carbonate-Vitamin D (CALTRATE 600+D) 600-400 MG-UNIT per tablet Take 1 tablet by mouth daily.     Yes [provider]  carvedilol (COREG) 6.25 MG tablet Take 1 tablet (6.25 mg total) by mouth 2 (two) times daily. 05/20/17  Yes Josue Hector, MD  furosemide (LASIX) 40 MG tablet Take 1 tablet (40 mg total) by mouth daily. 08/26/17  Yes Josue Hector, MD  ketorolac (ACULAR) 0.5 % ophthalmic solution Place 1 drop into the right eye 2 (two) times daily. 03/17/15  Yes [provider]  levothyroxine (SYNTHROID, LEVOTHROID) 100 MCG tablet TAKE 1 TABLET DAILY 08/26/17  Yes Laurey Morale, MD  meclizine (ANTIVERT) 25 MG tablet TAKE 1/2 TO 1 TABLET BY MOUTH EVERY 6 HOURS AS NEEDED FOR DIZZINESS 12/19/16  Yes Laurey Morale, MD  meloxicam (MOBIC) 7.5 MG tablet TAKE 1 TABLET DAILY 08/26/17  Yes Laurey Morale, MD  Multiple Vitamins-Minerals (CENTRUM SILVER PO) Take 1 tablet by mouth daily.     Yes [provider]  potassium chloride SA (K-DUR,KLOR-CON) 20 MEQ tablet TAKE 1 TABLET TWICE A DAY 12/09/17  Yes Josue Hector, MD  quinapril (ACCUPRIL) 20 MG tablet Take 1 tablet (20 mg total) by mouth daily. 06/10/17  Yes Josue Hector, MD  spironolactone (ALDACTONE) 25 MG tablet Take 1 tablet (25 mg total) by mouth daily. 04/08/17  Yes Josue Hector, MD  sulfamethoxazole-trimethoprim (BACTRIM DS,SEPTRA DS) 800-160 MG tablet TAKE 1 TABLET BY MOUTH EVERY 12 HOURS 01/12/18  Yes [provider]  vitamin B-12 (CYANOCOBALAMIN) 1000 MCG tablet Take 1,000 mcg by mouth daily.     Yes [provider]  warfarin (COUMADIN) 5 MG tablet Take 2.5-5 mg by mouth See admin instructions. Take 1 and 1/2 tablets on Monday then take 1 tablet all  the other days   Yes [provider]  warfarin (COUMADIN) 5 MG tablet TAKE AS DIRECTED BY ANTICOAGULATION CLINIC Patient not taking: Reported on 01/16/2018 08/12/17   Josue Hector, MD    Physical Exam: Vitals:   01/16/18 1200 01/16/18 1215 01/16/18 1230 01/16/18 1245  BP: 120/71 124/61 126/75 (!) 119/57  Pulse: 83 90 91 100  Resp: (!) 24 (!) 27 18  18  Temp:      TempSrc:      SpO2: 94% 93% 92% 95%   .TCS Constitutional: NAD, calm, comfortable Vitals:   01/16/18 1200 01/16/18 1215 01/16/18 1230 01/16/18 1245  BP: 120/71 124/61 126/75 (!) 119/57  Pulse: 83 90 91 100  Resp: (!) 24 (!) 27 18 18   Temp:      TempSrc:      SpO2: 94% 93% 92% 95%   Eyes: PERRL, lids and conjunctivae normal ENMT: Mucous membranes are moist. Posterior pharynx clear of any exudate or lesions.Normal dentition.  Neck: normal, supple, no masses, no thyromegaly Respiratory: coarse BS to auscultation bilaterally, no wheezing, no crackles, some ronchi. Normal respiratory effort. No accessory muscle use.  Cardiovascular: Regular rate and rhythm after IV fliuds, no murmurs / rubs / gallops. No extremity edema. 2+ pedal pulses. No carotid bruits.  Abdomen: no tenderness, no masses palpated. No hepatosplenomegaly. Bowel sounds positive.  Musculoskeletal: no clubbing / cyanosis. No joint deformity upper and lower extremities. Good ROM, no contractures. Normal muscle tone.  Skin: no rashes, lesions, ulcers. No induration Neurologic: CN 2-12 grossly intact. Sensation intact, DTR normal. Strength 5/5 in all 4.  Psychiatric: Normal judgment and insight. Alert and oriented x 3. Normal mood.    Labs on Admission: I have personally reviewed following labs and imaging studies  CBC: Recent Labs  Lab 01/16/18 0724  WBC 5.2  NEUTROABS 4.1  HGB 11.5*  HCT 35.1*  MCV 94.4  PLT 789   Basic Metabolic Panel: Recent Labs  Lab 01/16/18 0724  NA 132*  K 5.1  CL 101  CO2 21*  GLUCOSE 98  BUN 22*    CREATININE 1.38*  CALCIUM 9.2   GFR: Estimated Creatinine Clearance: 32.7 mL/min (A) (by C-G formula based on SCr of 1.38 mg/dL (H)). Liver Function Tests: No results for input(s): AST, ALT, ALKPHOS, BILITOT, PROT, ALBUMIN in the last 168 hours. No results for input(s): LIPASE, AMYLASE in the last 168 hours. No results for input(s): AMMONIA in the last 168 hours. Coagulation Profile: Recent Labs  Lab 01/15/18 1229 01/16/18 0724  INR 3.3 3.00   Cardiac Enzymes: No results for input(s): CKTOTAL, CKMB, CKMBINDEX, TROPONINI in the last 168 hours. BNP (last 3 results) No results for input(s): PROBNP in the last 8760 hours. HbA1C: No results for input(s): HGBA1C in the last 72 hours. CBG: No results for input(s): GLUCAP in the last 168 hours. Lipid Profile: No results for input(s): CHOL, HDL, LDLCALC, TRIG, CHOLHDL, LDLDIRECT in the last 72 hours. Thyroid Function Tests: No results for input(s): TSH, T4TOTAL, FREET4, T3FREE, THYROIDAB in the last 72 hours. Anemia Panel: No results for input(s): VITAMINB12, FOLATE, FERRITIN, TIBC, IRON, RETICCTPCT in the last 72 hours. Urine analysis:    Component Value Date/Time   COLORURINE STRAW (A) 01/16/2018 0625   APPEARANCEUR CLEAR 01/16/2018 0625   LABSPEC 1.006 01/16/2018 0625   PHURINE 6.0 01/16/2018 0625   GLUCOSEU NEGATIVE 01/16/2018 0625   GLUCOSEU NEGATIVE 07/15/2008 1258   HGBUR SMALL (A) 01/16/2018 0625   BILIRUBINUR NEGATIVE 01/16/2018 0625   BILIRUBINUR negative 01/14/2018 1518   KETONESUR 5 (A) 01/16/2018 0625   PROTEINUR NEGATIVE 01/16/2018 0625   UROBILINOGEN 0.2 01/14/2018 1518   UROBILINOGEN 0.2 09/12/2014 1810   NITRITE NEGATIVE 01/16/2018 0625   LEUKOCYTESUR NEGATIVE 01/16/2018 0625    Radiological Exams on Admission: Dg Chest 2 View  Result Date: 01/16/2018 CLINICAL DATA:  Cough, fever, and shortness of breath. EXAM: CHEST  2 VIEW  COMPARISON:  02/18/2016 FINDINGS: The heart is enlarged. There is aortic  atherosclerosis. Diffusely increased interstitial markings and peribronchial cuffing consistent pulmonary edema. Fluid in the fissures without large subpulmonic effusion. No confluent airspace disease. No pneumothorax. Bones are under mineralized. No acute osseous abnormalities. IMPRESSION: 1. Cardiomegaly with peribronchial cuffing and increased interstitial opacities suspicious for pulmonary edema. Acute bronchitis could have a similar appearance. 2. Fluid in the fissures without large subpulmonic effusion. Electronically Signed   By: Jeb Levering M.D.   On: 01/16/2018 06:54   I have independently reviewed her chest x-ray and concur with peribronchial cuffing vs possible bronchitis.  Assessment/Plan Principal Problem:   Hypoxemia Active Problems:   Lower respiratory infection   ATRIAL FIBRILLATION   Long term current use of anticoagulant   Diastolic congestive heart failure with preserved left ventricular function, NYHA class 2 (HCC)   Hypothyroidism   Hyperlipemia   Essential hypertension   Venous (peripheral) insufficiency   UTERINE CANCER, HX OF   History of colon cancer in adulthood   1.  Hypoxemia: We will provide oxygen supplementation and monitor overnight next I personally saw the patient in the emergency department when I saw her her sats were 91-92% on room air.  She has improved after giving her IV fluids and some antibiotics.  I do believe however azithromycin is adequate treatment for the bronchitis and we will not add additional medications to that.  I will however increase her dose to 500 mg daily while in the hospital. She has basically been admitted to the hospital to monitor the hypoxemia.  If this improves can discharge in a.m.  Chest x-ray has been ordered to document pneumonia if it is present.  2.  Lower respiratory infection: Likely bronchitis will treat with azithromycin, nebulizers, incentive spirometer's.  I will repeat chest x-ray in a.m. to rule out pneumonia  this has been ordered.  3.  Atrial fibrillation: She is currently weight controlled and on anticoagulation.  She follows with Dr. Nolon Lennert from cardiology and recently saw him in January 2019.  4.  Long-term current use of anticoagulant.  This is due to above-noted atrial fibrillation.  Her INR was elevated at 3.3 and her warfarin was held on February 20 due to her slightly elevated warfarin level.  She is structured to take her medication tonight.  We will follow her PT and INR attics.  5.  Diastolic congestive heart failure with preserved left ventricular function New York Heart Association class II: Continue home medication she appears to be euvolemic.  6.  Hypothyroidism continue home medications monitor TSH.  7.  Hyperlipidemia: Continue home medications.  8.  Essential hypertension: We will continue home medications however will need to keep a close eye on her blood pressure she had some low numbers in the emergency department.  9.  Venous peripheral insufficiency: Noted continue home treatment plan which currently includes anticoagulation.  10.  History of uterine cancer: Noted.  11.  History of colon cancer and adulthood: Noted.     DVT prophylaxis: Fully anticoagulated Code Status: Do not attempt resuscitation Family Communication: Spoke with patient's family member who was present in the room I believe she was a daughter or a niece. Disposition Plan: Likely home in 24 hours Consults called: None Admission status: Observation   Lady Deutscher MD FACP [facility] Pager [phone]  If 7PM-7AM, please contact night-coverage [email or web address] Password [email or web address]  01/16/2018, 1:13 PM

## 2018-01-17 ENCOUNTER — Observation Stay (HOSPITAL_COMMUNITY): Payer: Medicare Other

## 2018-01-17 ENCOUNTER — Other Ambulatory Visit: Payer: Self-pay

## 2018-01-17 DIAGNOSIS — I5030 Unspecified diastolic (congestive) heart failure: Secondary | ICD-10-CM | POA: Diagnosis not present

## 2018-01-17 DIAGNOSIS — R0902 Hypoxemia: Secondary | ICD-10-CM | POA: Diagnosis not present

## 2018-01-17 DIAGNOSIS — J22 Unspecified acute lower respiratory infection: Secondary | ICD-10-CM

## 2018-01-17 DIAGNOSIS — J209 Acute bronchitis, unspecified: Secondary | ICD-10-CM | POA: Diagnosis not present

## 2018-01-17 DIAGNOSIS — I10 Essential (primary) hypertension: Secondary | ICD-10-CM | POA: Diagnosis not present

## 2018-01-17 DIAGNOSIS — J40 Bronchitis, not specified as acute or chronic: Secondary | ICD-10-CM | POA: Diagnosis not present

## 2018-01-17 DIAGNOSIS — I4891 Unspecified atrial fibrillation: Secondary | ICD-10-CM

## 2018-01-17 LAB — URINE CULTURE: Culture: <10000 — AB

## 2018-01-17 LAB — CBC
HEMATOCRIT: 33.4 % — AB (ref 36.0–46.0)
HEMOGLOBIN: 11 g/dL — AB (ref 12.0–15.0)
MCH: 31.6 pg (ref 26.0–34.0)
MCHC: 32.9 g/dL (ref 30.0–36.0)
MCV: 96 fL (ref 78.0–100.0)
Platelets: 159 10*3/uL (ref 150–400)
RBC: 3.48 MIL/uL — ABNORMAL LOW (ref 3.87–5.11)
RDW: 13.5 % (ref 11.5–15.5)
WBC: 4.1 10*3/uL (ref 4.0–10.5)

## 2018-01-17 LAB — BASIC METABOLIC PANEL
Anion gap: 8 (ref 5–15)
BUN: 17 mg/dL (ref 6–20)
CHLORIDE: 104 mmol/L (ref 101–111)
CO2: 20 mmol/L — AB (ref 22–32)
Calcium: 8.7 mg/dL — ABNORMAL LOW (ref 8.9–10.3)
Creatinine, Ser: 1.31 mg/dL — ABNORMAL HIGH (ref 0.44–1.00)
GFR calc Af Amer: 41 mL/min — ABNORMAL LOW (ref >60)
GFR calc non Af Amer: 36 mL/min — ABNORMAL LOW (ref >60)
GLUCOSE: 149 mg/dL — AB (ref 65–99)
POTASSIUM: 4.5 mmol/L (ref 3.5–5.1)
Sodium: 132 mmol/L — ABNORMAL LOW (ref 135–145)

## 2018-01-17 LAB — PROTIME-INR
INR: 3.78
Prothrombin Time: 37 seconds — ABNORMAL HIGH (ref 11.4–15.2)

## 2018-01-17 MED ORDER — DOXYCYCLINE HYCLATE 100 MG PO TABS: 100.0000 mg | ORAL_TABLET | Freq: Two times a day (BID) | ORAL | 0 refills | Status: DC

## 2018-01-17 MED ORDER — WARFARIN SODIUM 5 MG PO TABS: 2.5000 mg | ORAL_TABLET | ORAL | Status: DC

## 2018-01-17 MED ORDER — DOXYCYCLINE HYCLATE 100 MG PO TABS
100.0000 mg | ORAL_TABLET | Freq: Two times a day (BID) | ORAL | Status: DC
  Administered 2018-01-17: 100 mg via ORAL
  Filled 2018-01-17: qty 1

## 2018-01-17 MED ORDER — ONDANSETRON HCL 4 MG PO TABS: 4.0000 mg | ORAL_TABLET | Freq: Four times a day (QID) | ORAL | 0 refills | Status: DC | PRN

## 2018-01-17 NOTE — Discharge Summary (Signed)
Physician Discharge Summary  Chloe FAIELLA [name]:[id number] DOB: May 19, 1929 DOA: 01/16/2018  PCP: Laurey Morale, MD  Admit date: 01/16/2018 Discharge date: 01/17/2018  Admitted From: ILF Disposition: ILF  Recommendations for Outpatient Follow-up:  1. Follow up with PCP in 1 week 2. Recheck INR early next week 3. Please obtain BMP/CBC in one week 4. Please follow up on the following pending results: None  Home Health: None Equipment/Devices: None  Discharge Condition: Stable CODE STATUS: DNR Diet recommendation: Heart healthy   Brief/Interim Summary:  Admission HPI written by Chloe Deutscher, MD   Chief Complaint: Shortness of breath with possible pneumonia  HPI: [name] is a 84 y.o. female with medical history significant for congestive heart failure, hypothyroidism, atrial fibrillation on warfarin and peripheral vascular disease who has had flulike symptoms for several days.  She had been feeling poorly over the weekend and on Sunday decided to go to urgent care.  She was diagnosed with a urinary tract infection and started on TMP sulfa.  On Monday she felt a little bit better but on Tuesday began somewhat short of breath.  Went to see her primary care physician Dr. Sarajane Jews who prescribed a Z-Pak for a bronchitis.  She has not improved since taking her Z-Pak and continues to have a cough muscle aches, fevers nausea, dysuria and generalized weakness.  She denies a headache but has had no vomiting.  She has no neck pain or stiffness she did have one episode of diarrhea 2 days ago but otherwise has had normal bowel movements.  She has had a poor appetite due to her nausea.  Not taken any additional medications for her symptoms.  ED Course: Hypoxemic at 89% with tachycardia and fever of 101.9. Chest x-ray showed vascular cuffing consistent with CHF versus bronchitis. Physical examination she had a tight chest and she was short of breath and somewhat congested Due to her fever and  heart rate code sepsis was called however her lactic acid was normal and is not felt that the patient is septic at this time.  Did receive 1 L of fluid bolus with improvement in a soft blood pressure.  Influenza panel was negative.    Hospital course:  Hypoxemia Acute bronchitis Secondary to acute bronchitis. Resolved. Switched azithromycin to doxycycline.  Atrial fibrillation Rate controlled. On coumadin. INR supratherapeutic. Hold for two days. Recheck early next week  Diastolic heart failure Euvolemic.   Hypothyroidism Continue Synthroid  Hyperlipidemia Continued atorvastatin  Essential hypertension Some soft blood pressures. Recommend discontinuing spironolactone and quinapril and restarting as needed  History of UTI Treated with Bactrim. Discontinued prior to discharge.  Discharge Diagnoses:  Principal Problem:   Hypoxemia Active Problems:   Hypothyroidism   Hyperlipemia   Essential hypertension   ATRIAL FIBRILLATION   Venous (peripheral) insufficiency   UTERINE CANCER, HX OF   Long term current use of anticoagulant   Lower respiratory infection   History of colon cancer in adulthood   Diastolic congestive heart failure with preserved left ventricular function, NYHA class 2 (HCC)    Discharge Instructions  Discharge Instructions    Call MD for:  difficulty breathing, headache or visual disturbances   Complete by:  As directed    Call MD for:  extreme fatigue   Complete by:  As directed    Call MD for:  temperature >100.4   Complete by:  As directed    Diet - low sodium heart healthy   Complete by:  As directed  Increase activity slowly   Complete by:  As directed      Allergies as of 01/17/2018      Reactions   Codeine Nausea Only   REACTION: nausea      Medication List    STOP taking these medications   azithromycin 250 MG tablet Commonly known as:  ZITHROMAX   meclizine 25 MG tablet Commonly known as:  ANTIVERT    sulfamethoxazole-trimethoprim 800-160 MG tablet Commonly known as:  BACTRIM DS,SEPTRA DS     TAKE these medications   ALPRAZolam 0.25 MG tablet Commonly known as:  XANAX Take 1 tablet (0.25 mg total) by mouth at bedtime as needed. for sleep   atorvastatin 20 MG tablet Commonly known as:  LIPITOR Take 1 tablet (20 mg total) by mouth daily.   CALTRATE 600+D 600-400 MG-UNIT tablet Generic drug:  Calcium Carbonate-Vitamin D Take 1 tablet by mouth daily.   carvedilol 6.25 MG tablet Commonly known as:  COREG Take 1 tablet (6.25 mg total) by mouth 2 (two) times daily.   CENTRUM SILVER PO Take 1 tablet by mouth daily.   doxycycline 100 MG tablet Commonly known as:  VIBRA-TABS Take 1 tablet (100 mg total) by mouth every 12 (twelve) hours for 4 days.   furosemide 40 MG tablet Commonly known as:  LASIX Take 1 tablet (40 mg total) by mouth daily.   ketorolac 0.5 % ophthalmic solution Commonly known as:  ACULAR Place 1 drop into the right eye 2 (two) times daily.   levothyroxine 100 MCG tablet Commonly known as:  SYNTHROID, LEVOTHROID TAKE 1 TABLET DAILY   meloxicam 7.5 MG tablet Commonly known as:  MOBIC TAKE 1 TABLET DAILY   ondansetron 4 MG tablet Commonly known as:  ZOFRAN Take 1 tablet (4 mg total) by mouth every 6 (six) hours as needed for nausea.   potassium chloride SA 20 MEQ tablet Commonly known as:  K-DUR,KLOR-CON TAKE 1 TABLET TWICE A DAY   quinapril 20 MG tablet Commonly known as:  ACCUPRIL Take 1 tablet (20 mg total) by mouth daily.   spironolactone 25 MG tablet Commonly known as:  ALDACTONE Take 1 tablet (25 mg total) by mouth daily.   vitamin B-12 1000 MCG tablet Commonly known as:  CYANOCOBALAMIN Take 1,000 mcg by mouth daily.   warfarin 5 MG tablet Commonly known as:  COUMADIN Take as directed. If you are unsure how to take this medication, talk to your nurse or doctor. Original instructions:  Take 0.5-1 tablets (2.5-5 mg total) by mouth See  admin instructions. Take 1 and 1/2 tablets on Monday then take 1 tablet all the other days Start taking on:  01/19/2018 What changed:    These instructions start on 01/19/2018. If you are unsure what to do until then, ask your doctor or other care provider.  Another medication with the same name was removed. Continue taking this medication, and follow the directions you see here.      Follow-up Information    Laurey Morale, MD. Schedule an appointment as soon as possible for a visit in 1 week(s).   Specialty:  Family Medicine Contact information: [address] [place] [address] [phone]          Allergies  Allergen Reactions  . Codeine Nausea Only    REACTION: nausea    Consultations:  None   Procedures/Studies: X-ray Chest Pa And Lateral  Result Date: 01/17/2018 CLINICAL DATA:  Bronchitis, hypertension. EXAM: CHEST  2 VIEW COMPARISON:  01/16/2018  FINDINGS: Cardiomegaly. Diffuse interstitial prominence compatible with interstitial edema, slightly improved since prior study. No visible effusions or acute bony abnormality. IMPRESSION: Improving interstitial edema. Electronically Signed   By: Rolm Baptise M.D.   On: 01/17/2018 07:40   Dg Chest 2 View  Result Date: 01/16/2018 CLINICAL DATA:  Cough, fever, and shortness of breath. EXAM: CHEST  2 VIEW COMPARISON:  02/18/2016 FINDINGS: The heart is enlarged. There is aortic atherosclerosis. Diffusely increased interstitial markings and peribronchial cuffing consistent pulmonary edema. Fluid in the fissures without large subpulmonic effusion. No confluent airspace disease. No pneumothorax. Bones are under mineralized. No acute osseous abnormalities. IMPRESSION: 1. Cardiomegaly with peribronchial cuffing and increased interstitial opacities suspicious for pulmonary edema. Acute bronchitis could have a similar appearance. 2. Fluid in the fissures without large subpulmonic effusion. Electronically Signed   By: Jeb Levering M.D.   On: 01/16/2018 06:54      Subjective: Productive cough improved. Still with brown sputum.  Discharge Exam: Vitals:   01/17/18 1110 01/17/18 1300  BP:  (!) 100/51  Pulse:  85  Resp:  16  Temp:  98.4 F (36.9 C)  SpO2: 93% 95%   Vitals:   01/17/18 0700 01/17/18 1045 01/17/18 1110 01/17/18 1300  BP:  119/77  (!) 100/51  Pulse:  63  85  Resp:    16  Temp:    98.4 F (36.9 C)  TempSrc:    Oral  SpO2: 95%  93% 95%  Weight:      Height:        General: Pt is alert, awake, not in acute distress Respiratory: CTA bilaterally, no wheezing, no rhonchi Abdominal: Soft, NT, ND, bowel sounds + Extremities: no edema, no cyanosis    The results of significant diagnostics from this hospitalization (including imaging, microbiology, ancillary and laboratory) are listed below for reference.     Microbiology: Recent Results (from the past 240 hour(s))  Urine culture     Status: Abnormal   Collection Time: 01/16/18  6:25 AM  Result Value Ref Range Status   Specimen Description URINE, RANDOM  Final   Special Requests NONE  Final   Culture (A)  Final    <10,000 COLONIES/mL INSIGNIFICANT GROWTH Performed at [facility], [address], [place], [place] [address]    Report Status 01/17/2018 FINAL  Final     Labs: BNP (last 3 results) Recent Labs    01/16/18 0731  BNP 967.8*   Basic Metabolic Panel: Recent Labs  Lab 01/16/18 0724 01/17/18 0814  NA 132* 132*  K 5.1 4.5  CL 101 104  CO2 21* 20*  GLUCOSE 98 149*  BUN 22* 17  CREATININE 1.38* 1.31*  CALCIUM 9.2 8.7*   Liver Function Tests: No results for input(s): AST, ALT, ALKPHOS, BILITOT, PROT, ALBUMIN in the last 168 hours. No results for input(s): LIPASE, AMYLASE in the last 168 hours. No results for input(s): AMMONIA in the last 168 hours. CBC: Recent Labs  Lab 01/16/18 0724 01/17/18 0814  WBC 5.2 4.1  NEUTROABS 4.1  --   HGB 11.5* 11.0*  HCT 35.1* 33.4*  MCV 94.4 96.0  PLT 187 159    Cardiac Enzymes: No results for input(s): CKTOTAL, CKMB, CKMBINDEX, TROPONINI in the last 168 hours. BNP: Invalid input(s): POCBNP CBG: No results for input(s): GLUCAP in the last 168 hours. D-Dimer No results for input(s): DDIMER in the last 72 hours. Hgb A1c No results for input(s): HGBA1C in the last 72 hours. Lipid Profile  No results for input(s): CHOL, HDL, LDLCALC, TRIG, CHOLHDL, LDLDIRECT in the last 72 hours. Thyroid function studies No results for input(s): TSH, T4TOTAL, T3FREE, THYROIDAB in the last 72 hours.  Invalid input(s): FREET3 Anemia work up No results for input(s): VITAMINB12, FOLATE, FERRITIN, TIBC, IRON, RETICCTPCT in the last 72 hours. Urinalysis    Component Value Date/Time   COLORURINE STRAW (A) 01/16/2018 0625   APPEARANCEUR CLEAR 01/16/2018 0625   LABSPEC 1.006 01/16/2018 0625   PHURINE 6.0 01/16/2018 0625   GLUCOSEU NEGATIVE 01/16/2018 0625   GLUCOSEU NEGATIVE 07/15/2008 1258   HGBUR SMALL (A) 01/16/2018 0625   BILIRUBINUR NEGATIVE 01/16/2018 0625   BILIRUBINUR negative 01/14/2018 1518   KETONESUR 5 (A) 01/16/2018 0625   PROTEINUR NEGATIVE 01/16/2018 0625   UROBILINOGEN 0.2 01/14/2018 1518   UROBILINOGEN 0.2 09/12/2014 1810   NITRITE NEGATIVE 01/16/2018 0625   LEUKOCYTESUR NEGATIVE 01/16/2018 0625   Sepsis Labs Invalid input(s): PROCALCITONIN,  WBC,  LACTICIDVEN Microbiology Recent Results (from the past 240 hour(s))  Urine culture     Status: Abnormal   Collection Time: 01/16/18  6:25 AM  Result Value Ref Range Status   Specimen Description URINE, RANDOM  Final   Special Requests NONE  Final   Culture (A)  Final    <10,000 COLONIES/mL INSIGNIFICANT GROWTH Performed at [facility], [place] [address] [place],  [address]    Report Status 01/17/2018 FINAL  Final    SIGNED:   Cordelia Poche, MD [facility] 01/17/2018, 3:16 PM Pager [phone]  If 7PM-7AM, please contact night-coverage [email or web address] Password  [email or web address]

## 2018-01-17 NOTE — Evaluation (Signed)
Physical Therapy Evaluation Patient Details Name: [name] MRN: [id number] DOB: 1928/12/31 Today's Date: 01/17/2018   History of Present Illness  84yo female with complaints of flulike symptoms for several days, diagnosed with UTI and bronchitis by urgent care and PCP. Did not feel better and went to the ED. Diagnosed with hypoxemia, lower respiratory infection. PMH anxiety, A-fib, hx CA, LBP, HTN, hypothyroidism, mitral valve disorder, OA, osteoporosis, peripheral neuropathy, PVD, hx lumbar laminectomy due to spinal stenosis, hx eye surgery   Clinical Impression  Patient received sitting at EOB with nursing present, very pleasant and willing to participate with skilled PT services. She is able to perform functional transfers and gait with no device, general S for safety; note mild unsteadiness however patient able to self-correct and reports she is much more stable with her shoes/inserts. Note SpO2 generally remained between 91-94% on room air during gait period; she did have a momentary desat to 88% but this quickly recovered and may have been due to monitor signal. She was left sitting at bedside with nursing present, all other needs met this morning. At this point patient appears to be generally at her baseline level of function, and is not in need of skilled PT services after discharge, although she will benefit from skilled PT care in the acute care setting to prevent deconditioning and improve balance.     Follow Up Recommendations No PT follow up    Equipment Recommendations  None recommended by PT    Recommendations for Other Services       Precautions / Restrictions Precautions Precautions: Other (comment) Precaution Comments: charcot foot- needs shoe with insert for long distances, can walk without shoe/inserts for short distances  Restrictions Weight Bearing Restrictions: No      Mobility  Bed Mobility               General bed mobility comments: DNT, received  sitting up at EOB with nursing   Transfers Overall transfer level: Needs assistance Equipment used: None Transfers: Sit to/from Stand Sit to Stand: Supervision         General transfer comment: S for safety   Ambulation/Gait Ambulation/Gait assistance: Supervision Ambulation Distance (Feet): 100 Feet Assistive device: None Gait Pattern/deviations: Step-through pattern;Trunk flexed     General Gait Details: mild unsteadiness noted, patient able to maintain balance independently   Stairs            Wheelchair Mobility    Modified Rankin (Stroke Patients Only)       Balance Overall balance assessment: Mild deficits observed, not formally tested                                           Pertinent Vitals/Pain Pain Assessment: No/denies pain    Home Living Family/patient expects to be discharged to:: Private residence Living Arrangements: Alone Available Help at Discharge: Family;Available PRN/intermittently;Friend(s) Type of Home: Apartment Home Access: Elevator     Home Layout: One level Home Equipment: Cane - single point Additional Comments: lives in [facility] living facility     Prior Function Level of Independence: Independent               Hand Dominance        Extremity/Trunk Assessment        Lower Extremity Assessment Lower Extremity Assessment: Overall WFL for tasks assessed    Cervical / Trunk Assessment Cervical /  Trunk Assessment: Normal  Communication   Communication: No difficulties  Cognition Arousal/Alertness: Awake/alert Behavior During Therapy: WFL for tasks assessed/performed Overall Cognitive Status: Within Functional Limits for tasks assessed                                        General Comments      Exercises     Assessment/Plan    PT Assessment Patient needs continued PT services  PT Problem List Decreased coordination;Decreased activity tolerance;Decreased  balance       PT Treatment Interventions DME instruction;Therapeutic activities;Gait training;Therapeutic exercise;Patient/family education;Stair training;Balance training;Functional mobility training;Neuromuscular re-education    PT Goals (Current goals can be found in the Care Plan section)  Acute Rehab PT Goals Patient Stated Goal: to go home  PT Goal Formulation: With patient Time For Goal Achievement: 01/24/18 Potential to Achieve Goals: Good    Frequency     Barriers to discharge        Co-evaluation               AM-PAC PT "6 Clicks" Daily Activity  Outcome Measure Difficulty turning over in bed (including adjusting bedclothes, sheets and blankets)?: None Difficulty moving from lying on back to sitting on the side of the bed? : None Difficulty sitting down on and standing up from a chair with arms (e.g., wheelchair, bedside commode, etc,.)?: None Help needed moving to and from a bed to chair (including a wheelchair)?: None Help needed walking in hospital room?: A Little Help needed climbing 3-5 steps with a railing? : A Little 6 Click Score: 22    End of Session Equipment Utilized During Treatment: Gait belt Activity Tolerance: Patient tolerated treatment well Patient left: in bed;with nursing/sitter in room;with family/visitor present(sitting at EOB with nursing present ) Nurse Communication: Mobility status PT Visit Diagnosis: Unsteadiness on feet (R26.81)    Time: 1055-1110 PT Time Calculation (min) (ACUTE ONLY): 15 min   Charges:   PT Evaluation $PT Eval Low Complexity: 1 Low     PT G Codes:        Deniece Ree PT, DPT, CBIS  Supplemental Physical Therapist [place]   Pager [phone]

## 2018-01-17 NOTE — Discharge Instructions (Addendum)
[name],  You were treated for bronchitis. You will go home with antibiotics. Your INR was high, so please hold your warfarin dose until Sunday, 2/24. Please have your INR rechecked on Monday, 2/25 if possible.    Information on my medicine - Coumadin   (Warfarin)  This medication education was reviewed with me or my healthcare representative as part of my discharge preparation.  The pharmacist that spoke with me during my hospital stay was:  Saundra Shelling, [facility]  Why was Coumadin prescribed for you? Coumadin was prescribed for you because you have a blood clot or a medical condition that can cause an increased risk of forming blood clots. Blood clots can cause serious health problems by blocking the flow of blood to the heart, lung, or brain. Coumadin can prevent harmful blood clots from forming. As a reminder your indication for Coumadin is:   Stroke Prevention Because Of Atrial Fibrillation  What test will check on my response to Coumadin? While on Coumadin (warfarin) you will need to have an INR test regularly to ensure that your dose is keeping you in the desired range. The INR (international normalized ratio) number is calculated from the result of the laboratory test called prothrombin time (PT).  If an INR APPOINTMENT HAS NOT ALREADY BEEN MADE FOR YOU please schedule an appointment to have this lab work done by your health care provider within 7 days. Your INR goal is usually a number between:  2 to 3 or your provider may give you a more narrow range like 2-2.5.  Ask your health care provider during an office visit what your goal INR is.  What  do you need to  know  About  COUMADIN? Take Coumadin (warfarin) exactly as prescribed by your healthcare provider about the same time each day.  DO NOT stop taking without talking to the doctor who prescribed the medication.  Stopping without other blood clot prevention medication to take the place of Coumadin may increase your risk of  developing a new clot or stroke.  Get refills before you run out.  What do you do if you miss a dose? If you miss a dose, take it as soon as you remember on the same day then continue your regularly scheduled regimen the next day.  Do not take two doses of Coumadin at the same time.  Important Safety Information A possible side effect of Coumadin (Warfarin) is an increased risk of bleeding. You should call your healthcare provider right away if you experience any of the following: ? Bleeding from an injury or your nose that does not stop. ? Unusual colored urine (red or dark brown) or unusual colored stools (red or black). ? Unusual bruising for unknown reasons. ? A serious fall or if you hit your head (even if there is no bleeding).  Some foods or medicines interact with Coumadin (warfarin) and might alter your response to warfarin. To help avoid this: ? Eat a balanced diet, maintaining a consistent amount of Vitamin K. ? Notify your provider about major diet changes you plan to make. ? Avoid alcohol or limit your intake to 1 drink for women and 2 drinks for men per day. (1 drink is 5 oz. wine, 12 oz. beer, or 1.5 oz. liquor.)  Make sure that ANY health care provider who prescribes medication for you knows that you are taking Coumadin (warfarin).  Also make sure the healthcare provider who is monitoring your Coumadin knows when you have started  a new medication including herbals and non-prescription products.  Coumadin (Warfarin)  Major Drug Interactions  Increased Warfarin Effect Decreased Warfarin Effect  Alcohol (large quantities) Antibiotics (esp. Septra/Bactrim, Flagyl, Cipro) Amiodarone (Cordarone) Aspirin (ASA) Cimetidine (Tagamet) Megestrol (Megace) NSAIDs (ibuprofen, naproxen, etc.) Piroxicam (Feldene) Propafenone (Rythmol SR) Propranolol (Inderal) Isoniazid (INH) Posaconazole (Noxafil) Barbiturates (Phenobarbital) Carbamazepine (Tegretol) Chlordiazepoxide  (Librium) Cholestyramine (Questran) Griseofulvin Oral Contraceptives Rifampin Sucralfate (Carafate) Vitamin K   Coumadin (Warfarin) Major Herbal Interactions  Increased Warfarin Effect Decreased Warfarin Effect  Garlic Ginseng Ginkgo biloba Coenzyme Q10 Green tea St. Johns wort    Coumadin (Warfarin) FOOD Interactions  Eat a consistent number of servings per week of foods HIGH in Vitamin K (1 serving =  cup)  Collards (cooked, or boiled & drained) Kale (cooked, or boiled & drained) Mustard greens (cooked, or boiled & drained) Parsley *serving size only =  cup Spinach (cooked, or boiled & drained) Swiss chard (cooked, or boiled & drained) Turnip greens (cooked, or boiled & drained)  Eat a consistent number of servings per week of foods MEDIUM-HIGH in Vitamin K (1 serving = 1 cup)  Asparagus (cooked, or boiled & drained) Broccoli (cooked, boiled & drained, or raw & chopped) Brussel sprouts (cooked, or boiled & drained) *serving size only =  cup Lettuce, raw (green leaf, endive, romaine) Spinach, raw Turnip greens, raw & chopped   These websites have more information on Coumadin (warfarin):  FailFactory.se; VeganReport.com.au;

## 2018-01-17 NOTE — Progress Notes (Signed)
Pt given discharge instructions and gone over with her and niece present. Answered all questions to satisfaction. Pt in no distress at time of discharge.

## 2018-01-20 ENCOUNTER — Telehealth: Payer: Self-pay | Admitting: *Deleted

## 2018-01-20 ENCOUNTER — Telehealth: Payer: Self-pay | Admitting: Family Medicine

## 2018-01-20 ENCOUNTER — Ambulatory Visit (INDEPENDENT_AMBULATORY_CARE_PROVIDER_SITE_OTHER): Payer: Medicare Other | Admitting: Pharmacist

## 2018-01-20 DIAGNOSIS — I482 Chronic atrial fibrillation, unspecified: Secondary | ICD-10-CM

## 2018-01-20 DIAGNOSIS — Z7901 Long term (current) use of anticoagulants: Secondary | ICD-10-CM | POA: Diagnosis not present

## 2018-01-20 LAB — POCT INR: INR: 1.8

## 2018-01-20 NOTE — Telephone Encounter (Signed)
I left a voice message for pt to return my call.  

## 2018-01-20 NOTE — Telephone Encounter (Signed)
Transition Care Management Follow-up Telephone Call  PCP: Laurey Morale, MD  Admit date: 01/16/2018 Discharge date: 01/17/2018  Admitted From: ILF Disposition: ILF  Recommendations for Outpatient Follow-up:  1. Follow up with PCP in 1 week 2. Recheck INR early next week 3. Please obtain BMP/CBC in one week 4. Please follow up on the following pending results: None  Home Health: None Equipment/Devices: None  Discharge Condition: Stable    How have you been since you were released from the hospital? "better"   Do you understand why you were in the hospital? yes   Do you understand the discharge instructions? yes   Where were you discharged to? Home   Items Reviewed:  Medications reviewed: yes  Allergies reviewed: yes  Dietary changes reviewed: yes  Referrals reviewed: yes   Functional Questionnaire:   Activities of Daily Living (ADLs):   She states they are independent in the following: ambulation, bathing and hygiene, feeding, continence, grooming, toileting and dressing States they require assistance with the following: none   Any transportation issues/concerns?: no   Any patient concerns? no   Confirmed importance and date/time of follow-up visits scheduled yes  Provider Appointment booked with Dr. Sarajane Jews on 01/21/2018 Tuesday at 1:00 pm  Confirmed with patient if condition begins to worsen call PCP or go to the ER.  Patient was given the office number and encouraged to call back with question or concerns.  : yes

## 2018-01-20 NOTE — Telephone Encounter (Addendum)
Received a voicemail that pt was recently d/c from the hospital & prescribed an antibiotic. Her niece stated the pt needs an INR check per hospital d/c today. After assessing the med list, pt was prescribed Doxycycline 100mg  twice a day for a total of 8 tabs which equals 4 days. Will call pt to make an appt to have INR checked.   Prior to hospital pt was on zpak, antivert, and bactrim & INR was 2/21-3.00, 2/22-3.78, pt was in the hospital 2/21-2/22for bronchitis, hypoxemia.  01/20/18-pt called at 806am & appt set for pt later today.

## 2018-01-20 NOTE — Patient Instructions (Signed)
Description   Take your 1.5 tablets today, then resume same dosage 1 tablet daily except 1.5 tablets on Mondays.  Recheck in 3 weeks.  Call our office if you have any concerns or if you are put on any new medications [phone]

## 2018-01-21 ENCOUNTER — Encounter: Payer: Self-pay | Admitting: Family Medicine

## 2018-01-21 ENCOUNTER — Ambulatory Visit (INDEPENDENT_AMBULATORY_CARE_PROVIDER_SITE_OTHER): Payer: Medicare Other | Admitting: Family Medicine

## 2018-01-21 VITALS — BP 112/70 | HR 90 | Temp 97.5°F | Wt 178.6 lb

## 2018-01-21 DIAGNOSIS — Z8709 Personal history of other diseases of the respiratory system: Secondary | ICD-10-CM | POA: Diagnosis not present

## 2018-01-21 DIAGNOSIS — J209 Acute bronchitis, unspecified: Secondary | ICD-10-CM

## 2018-01-21 DIAGNOSIS — I4819 Other persistent atrial fibrillation: Secondary | ICD-10-CM

## 2018-01-21 DIAGNOSIS — I10 Essential (primary) hypertension: Secondary | ICD-10-CM | POA: Diagnosis not present

## 2018-01-21 DIAGNOSIS — I5030 Unspecified diastolic (congestive) heart failure: Secondary | ICD-10-CM

## 2018-01-21 LAB — CULTURE, BLOOD (ROUTINE X 2)
CULTURE: NO GROWTH
Culture: NO GROWTH
SPECIAL REQUESTS: ADEQUATE
Special Requests: ADEQUATE

## 2018-01-21 NOTE — Progress Notes (Signed)
   Subjective:    Patient ID: [name], female    DOB: 1929/03/25, 84 y.o.   MRN: [id number]  HPI Here to follow up a hospital stay from 01-16-18 to 01-17-18 for bronchitis. Her CXR also showed some interstitial edema in the lungs. She had been taking a Zpack prior to the admission, this was stopped and changed to Doxycycline. She responded quite well to this and has recovered quickly. She is now off the antibiotic and she feels back to normal. She has a slight dry cough occasionally. Appetite is back to normal. She has been monitoring her weight and BP daily and these have been stable.    Review of Systems  Constitutional: Negative.   Respiratory: Positive for cough. Negative for chest tightness, shortness of breath and wheezing.   Cardiovascular: Negative.   Neurological: Negative.        Objective:   Physical Exam  Constitutional: She is oriented to person, place, and time. She appears well-developed and well-nourished.  Cardiovascular: Normal rate, normal heart sounds and intact distal pulses.  Irregular rhythm   Pulmonary/Chest: Effort normal and breath sounds normal. No respiratory distress. She has no wheezes. She has no rales.  Musculoskeletal: She exhibits no edema.  Neurological: She is alert and oriented to person, place, and time.          Assessment & Plan:  She is doing well after recovering from bronchitis. Her BP and fluid status have stabilized as well. Recheck prn. Alysia Penna, MD

## 2018-01-31 ENCOUNTER — Telehealth: Payer: Self-pay | Admitting: Family Medicine

## 2018-01-31 NOTE — Telephone Encounter (Signed)
Copied from [place] [id number]. Topic: Quick Communication - See Telephone Encounter >> Jan 31, 2018  2:31 PM Arletha Grippe wrote: CRM for notification. See Telephone encounter for:   01/31/18. Pt needs a letter stating that she is physically able to live in independent living.  She would like to pick up letter. Pt was seen last week.  Please call when ready for pick up, and if no answer, please leave voicemail.  Cb is [phone]

## 2018-01-31 NOTE — Telephone Encounter (Signed)
This letter is ready

## 2018-01-31 NOTE — Telephone Encounter (Signed)
Sent to PCP ?

## 2018-02-03 NOTE — Telephone Encounter (Signed)
Called pt and left a detailed VM advising that letter is ready and has been placed up front for pick up.

## 2018-02-10 ENCOUNTER — Ambulatory Visit (INDEPENDENT_AMBULATORY_CARE_PROVIDER_SITE_OTHER): Payer: Medicare Other | Admitting: *Deleted

## 2018-02-10 DIAGNOSIS — I482 Chronic atrial fibrillation, unspecified: Secondary | ICD-10-CM

## 2018-02-10 DIAGNOSIS — Z7901 Long term (current) use of anticoagulants: Secondary | ICD-10-CM | POA: Diagnosis not present

## 2018-02-10 DIAGNOSIS — Z5181 Encounter for therapeutic drug level monitoring: Secondary | ICD-10-CM

## 2018-02-10 LAB — POCT INR: INR: 2.4

## 2018-02-10 NOTE — Patient Instructions (Signed)
Description   Continue  same dose of coumadin  1 tablet daily except 1.5 tablets on Mondays.  Recheck in 4 weeks.  Call our office if you have any concerns or if you are put on any new medications [phone]

## 2018-02-12 ENCOUNTER — Telehealth: Payer: Self-pay | Admitting: Cardiovascular Disease

## 2018-02-12 DIAGNOSIS — I10 Essential (primary) hypertension: Secondary | ICD-10-CM

## 2018-02-12 MED ORDER — QUINAPRIL HCL 20 MG PO TABS: 20.0000 mg | ORAL_TABLET | Freq: Every day | ORAL | 3 refills | Status: DC

## 2018-02-12 NOTE — Telephone Encounter (Signed)
Patient requesting a refill on spironolactone 25 mg and Quinapril 20 mg by mouth daily. Medications were discontinued at discharge at hospital. Patient stated her PCP, Dr. Sarajane Jews, thinks she should go back on medications. Patient reported most recent readings of BP and HR. Will forward to Dr. Johnsie Cancel for advisement and order if okay.   BP 128/93 HR 78, 128/95 HR 100, 138/95 HR 90, 139/90 HR 80  Here is note from Dr. Lonny Prude from hospital discharge summary. "Essential hypertension Some soft blood pressures. Recommend discontinuing spironolactone and quinapril and restarting as needed"

## 2018-02-12 NOTE — Telephone Encounter (Signed)
Pt is calling requesting a refill on Quinapril 20 mg tablet, taken daily, this medication was D/C by ED doctor and is no longer on pt's medication list. Would Dr Johnsie Cancel like to start this medication back. Pt would like a call back. Please address,

## 2018-02-12 NOTE — Telephone Encounter (Addendum)
Sent prescription in for quinapril to patient's pharmacy of choice. Patient has first available with [name] on 03/03/18 to check BP and lab work. Patient verbalized understanding of Dr. Kyla Balzarine recommendations and agreed to plan.

## 2018-02-12 NOTE — Telephone Encounter (Signed)
Can restart quinapril for now and f/u with PA in noffice couple of weeks to check BMET and BP

## 2018-02-13 ENCOUNTER — Telehealth: Payer: Self-pay | Admitting: Family Medicine

## 2018-02-13 NOTE — Telephone Encounter (Signed)
Copied from [place]. Topic: Quick Communication - See Telephone Encounter >> Feb 13, 2018 10:24 AM Lolita Rieger, RMA wrote: CRM for notification. See Telephone encounter for: 02/13/18.Judy from [facility] has a question on pt synthroid please call [phone] would like to switch to a generic [id number] is the reference #

## 2018-02-13 NOTE — Telephone Encounter (Signed)
Called [facility] and spoke with Lattie Haw and gave her the OK to fill generic for pt's levothyroxine Rx. Lattie Haw advised and voiced understanding and confirm change has been noted in pt's chart with [facility] .

## 2018-02-24 ENCOUNTER — Ambulatory Visit (INDEPENDENT_AMBULATORY_CARE_PROVIDER_SITE_OTHER): Payer: Medicare Other | Admitting: Family Medicine

## 2018-02-24 ENCOUNTER — Encounter: Payer: Self-pay | Admitting: Family Medicine

## 2018-02-24 VITALS — BP 118/70 | HR 69 | Temp 98.1°F | Ht 71.0 in | Wt 183.2 lb

## 2018-02-24 DIAGNOSIS — F411 Generalized anxiety disorder: Secondary | ICD-10-CM

## 2018-02-24 NOTE — Progress Notes (Signed)
   Subjective:    Patient ID: [name], female    DOB: 10/02/1929, 84 y.o.   MRN: [id number]  HPI Here to fill out a form to allow her to keep her dog at her apartment. She is widowed and lives alone, and her dog of 12 years has been a valuable companion and an aide to her emotional health.    Review of Systems  Constitutional: Negative.   Respiratory: Negative.   Cardiovascular: Negative.   Neurological: Negative.   Psychiatric/Behavioral: Negative.        Objective:   Physical Exam  Constitutional: She is oriented to person, place, and time. She appears well-developed and well-nourished.  Cardiovascular: Normal rate, regular rhythm, normal heart sounds and intact distal pulses.  Pulmonary/Chest: Effort normal and breath sounds normal. No respiratory distress. She has no wheezes. She has no rales.  Neurological: She is alert and oriented to person, place, and time.  Psychiatric: She has a normal mood and affect. Her behavior is normal. Thought content normal.          Assessment & Plan:  We filled out the form for her to take to her landlord. Recheck prn. Alysia Penna, MD

## 2018-03-02 NOTE — Progress Notes (Signed)
Cardiology Office Note:    Date:  03/03/2018   ID:  [name], DOB 1928-12-22, MRN [id number]  PCP:  Laurey Morale, MD  Cardiologist:  Jenkins Rouge, MD  Referring MD: Laurey Morale, MD   Chief Complaint  Patient presents with  . Hypertension    Med management    History of Present Illness:    [name] is a 84 y.o. female with a past medical history significant for atrial fibrillation on Coumadin, hypertension, hyperlipidemia, edema, mild aortic stenosis, hypothyroidism, Uterine cancer s/p hysterectomy 1991, colon cancer with chemo 1994  and Charcot foot.  Patient was recently hospitalized 01/16/18-01/17/18 with fever was treated for acute bronchitis with antibiotics.  She was in rate controlled atrial fibrillation, on Coumadin, initially supratherapeutic.  Her blood pressure was soft and spironolactone and enalapril were discontinued with plans to restart as needed.  Also her serum creatinine was elevated at 1.38.  Since that time the patient called in to discuss her medications and her quinapril was restarted for blood pressures 120s-130s/90s.  She is here today for follow-up of blood pressure and labs.  [name] is here alone. She has been restarted on her quinapril and spironolactone by her PCP. Home BP's 130-140/70-80. No CP, SOB, lightheadedness, activity intolerance, orthopnea, PND or edema.    Past Medical History:  Diagnosis Date  . Anxiety   . Atrial fibrillation ([place])   . Cancer ([place])   . Cataract    REMOVED BILATERAL  . Chest pain, unspecified   . Chronic low back pain   . Diverticulosis of colon   . History of colon cancer   . History of colonic polyps   . History of colonoscopy   . History of uterine cancer   . Hyperlipidemia   . Hypertension   . Hypothyroid   . Impaired glucose tolerance   . Mitral valve disorder   . Osteoarthritis   . Osteoporosis   . Patent foramen ovale   . Peripheral neuropathy   . Peripheral vascular disease ([place])   .  Splenic infarction   . UTI (lower urinary tract infection)   . Venous insufficiency   . Vitamin B12 deficiency     Past Surgical History:  Procedure Laterality Date  . ANTERIOR AND POSTERIOR VAGINAL REPAIR  03/2006   Dr.Neal  . basal cell skin cancer Moh's surgery    . CATARACT EXTRACTION, BILATERAL    . COLONOSCOPY  05-25-10   per Dr. Henrene Pastor, benign polyps, repeat in 5 yrs   . EYE SURGERY    . hysterectomy for uterine cancer    . left carpal tunnel surgery  02/2007   Dr. Daylene Katayama  . lumbar laminectomy for spinal stenosis  2006   L3-4  . RETINAL DETACHMENT REPAIR W/ SCLERAL BUCKLE LE Right 03-01-14   per Dr. Zadie Rhine   . right hemicolectomy for colon cancer  1994    Current Medications: Current Meds  Medication Sig  . ALPRAZolam (XANAX) 0.25 MG tablet Take 1 tablet (0.25 mg total) by mouth at bedtime as needed. for sleep  . atorvastatin (LIPITOR) 20 MG tablet Take 1 tablet (20 mg total) by mouth daily.  . Calcium Carbonate-Vitamin D (CALTRATE 600+D) 600-400 MG-UNIT per tablet Take 1 tablet by mouth daily.    . carvedilol (COREG) 6.25 MG tablet Take 1 tablet (6.25 mg total) by mouth 2 (two) times daily.  . furosemide (LASIX) 40 MG tablet Take 1 tablet (40 mg total) by mouth daily.  Marland Kitchen  ketorolac (ACULAR) 0.5 % ophthalmic solution Place 1 drop into the right eye 2 (two) times daily.  Marland Kitchen levothyroxine (SYNTHROID, LEVOTHROID) 100 MCG tablet TAKE 1 TABLET DAILY  . meloxicam (MOBIC) 7.5 MG tablet TAKE 1 TABLET DAILY  . Multiple Vitamins-Minerals (CENTRUM SILVER PO) Take 1 tablet by mouth daily.    . potassium chloride SA (K-DUR,KLOR-CON) 20 MEQ tablet TAKE 1 TABLET TWICE A DAY  . quinapril (ACCUPRIL) 20 MG tablet Take 1 tablet (20 mg total) by mouth at bedtime.  Marland Kitchen spironolactone (ALDACTONE) 25 MG tablet Take 25 mg by mouth daily.  . vitamin B-12 (CYANOCOBALAMIN) 1000 MCG tablet Take 1,000 mcg by mouth daily.    Marland Kitchen warfarin (COUMADIN) 5 MG tablet Take 0.5-1 tablets (2.5-5 mg total) by mouth See  admin instructions. Take 1 and 1/2 tablets on Monday then take 1 tablet all the other days     Allergies:   Codeine   Social History   Socioeconomic History  . Marital status: Widowed    Spouse name: Not on file  . Number of children: 0  . Years of education: Not on file  . Highest education level: Not on file  Occupational History  . Occupation: retired    Comment: part time at [employer]  . Financial resource strain: Not on file  . Food insecurity:    Worry: Not on file    Inability: Not on file  . Transportation needs:    Medical: Not on file    Non-medical: Not on file  Tobacco Use  . Smoking status: Never Smoker  . Smokeless tobacco: Never Used  Substance and Sexual Activity  . Alcohol use: Yes    Alcohol/week: 0.0 oz    Comment: occ  . Drug use: No  . Sexual activity: Never  Lifestyle  . Physical activity:    Days per week: Not on file    Minutes per session: Not on file  . Stress: Not on file  Relationships  . Social connections:    Talks on phone: Not on file    Gets together: Not on file    Attends religious service: Not on file    Active member of club or organization: Not on file    Attends meetings of clubs or organizations: Not on file    Relationship status: Not on file  Other Topics Concern  . Not on file  Social History Narrative  . Not on file     Family History: The patient's family history includes Cancer in her brother and mother; Heart disease in her sister; Heart failure in her father; Stroke in her sister. ROS:   Please see the history of present illness.   All other systems reviewed and are negative.  EKGs/Labs/Other Studies Reviewed:    The following studies were reviewed today:  Echocardiogram 12/12/17 Study Conclusions  - Left ventricle: The cavity size was normal. Wall thickness was   normal. Systolic function was normal. The estimated ejection   fraction was in the range of 60% to 65%. - Ventricular septum: The  contour showed diastolic flattening. - Aortic valve: There was trivial regurgitation. - Mitral valve: The findings are consistent with moderate stenosis.   There was mild to moderate regurgitation. Valve area by pressure   half-time: 1.03 cm^2. Valve area by continuity equation (using   LVOT flow): 0.86 cm^2. - Left atrium: The atrium was severely dilated. - Right ventricle: The cavity size was moderately dilated. Systolic   function was mildly  to moderately reduced. - Right atrium: The atrium was moderately dilated. - Tricuspid valve: There was mild-moderate regurgitation. - Pulmonary arteries: Systolic pressure was moderately increased.   PA peak pressure: 44 mm Hg (S).  EKG:  EKG is not ordered today.   Recent Labs: 11/20/2017: ALT 13; TSH 2.15 01/16/2018: B Natriuretic Peptide 283.4 01/17/2018: BUN 17; Creatinine, Ser 1.31; Hemoglobin 11.0; Platelets 159; Potassium 4.5; Sodium 132   Recent Lipid Panel    Component Value Date/Time   CHOL 127 11/20/2017 1137   TRIG 99.0 11/20/2017 1137   HDL 38.80 (L) 11/20/2017 1137   CHOLHDL 3 11/20/2017 1137   VLDL 19.8 11/20/2017 1137   LDLCALC 68 11/20/2017 1137    Physical Exam:    VS:  BP 130/60   Pulse 69   Ht 5\' 11"  (1.803 m)   Wt 179 lb (81.2 kg)   SpO2 96%   BMI 24.97 kg/m     Wt Readings from Last 3 Encounters:  03/03/18 179 lb (81.2 kg)  02/24/18 183 lb 3.2 oz (83.1 kg)  01/21/18 178 lb 9.6 oz (81 kg)     Physical Exam  Constitutional: She is oriented to person, place, and time. She appears well-developed and well-nourished. No distress.  HENT:  Head: Normocephalic and atraumatic.  Neck: Normal range of motion. Neck supple. No JVD present.  Cardiovascular: Normal rate. An irregularly irregular rhythm present.  Murmur heard.  Rumbling systolic murmur is present at the apex radiating to the neck and axilla.  Low-pitched rumbling crescendo presystolic murmur is present at the apex. Pulmonary/Chest: Effort normal and  breath sounds normal. No respiratory distress. She has no wheezes. She has no rales.  Abdominal: Soft. Bowel sounds are normal. She exhibits no distension. There is no tenderness.  Musculoskeletal: Normal range of motion. She exhibits no edema or deformity.  Neurological: She is alert and oriented to person, place, and time.  Skin: Skin is warm and dry.  Psychiatric: She has a normal mood and affect. Her behavior is normal. Thought content normal.     ASSESSMENT:    1. Essential hypertension   2. Persistent atrial fibrillation ([place])   3. Mixed hyperlipidemia   4. Diastolic congestive heart failure with preserved left ventricular function, NYHA class 2 (HCC)    PLAN:    In order of problems listed above:  Hypertension: Was off spironolactone and quinipril since hospitalization in February due to hypotension and mildly elevated creatinine, back on per PCP. BP's good. Will continue current meds and check BMet for renal function and electrolytes.   Permanent atrial fibrillation: Since about 1991. Rate control adequate with carvedilol.  On warfarin for stroke risk reduction.  No bleeding issues.  INR was supratherapeutic in February.  INR on 3/18 was therapeutic at 2.4  Hypercholesterolemia: On Lipitor 20 mg daily, LDL was 68 in 10/2017.  At goal of <100.   Chronic diastolic CHF: Currently euvolemic and asymptomatic. Continue current therapy.    Medication Adjustments/Labs and Tests Ordered: Current medicines are reviewed at length with the patient today.  Concerns regarding medicines are outlined above. Labs and tests ordered and medication changes are outlined in the patient instructions below:  Patient Instructions  Medication Instructions:  1. Your physician recommends that you continue on your current medications as directed. Please refer to the Current Medication list given to you today.   Labwork: TODAY BMET  Testing/Procedures: NONE ORDERED TODAY  Follow-Up: DR. Johnsie Cancel  IN 1 YEAR; WE WILL SEND OUT A REMINDER LETTER  Any Other Special Instructions Will Be Listed Below (If Applicable).     If you need a refill on your cardiac medications before your next appointment, please call your pharmacy.      Signed, Daune Perch, NP  03/03/2018 8:32 AM    [facility]

## 2018-03-03 ENCOUNTER — Ambulatory Visit (INDEPENDENT_AMBULATORY_CARE_PROVIDER_SITE_OTHER): Payer: Medicare Other | Admitting: Cardiology

## 2018-03-03 ENCOUNTER — Encounter: Payer: Self-pay | Admitting: Cardiology

## 2018-03-03 VITALS — BP 130/60 | HR 69 | Ht 71.0 in | Wt 179.0 lb

## 2018-03-03 DIAGNOSIS — I10 Essential (primary) hypertension: Secondary | ICD-10-CM

## 2018-03-03 DIAGNOSIS — E782 Mixed hyperlipidemia: Secondary | ICD-10-CM | POA: Diagnosis not present

## 2018-03-03 DIAGNOSIS — I481 Persistent atrial fibrillation: Secondary | ICD-10-CM

## 2018-03-03 DIAGNOSIS — I5030 Unspecified diastolic (congestive) heart failure: Secondary | ICD-10-CM

## 2018-03-03 DIAGNOSIS — I4819 Other persistent atrial fibrillation: Secondary | ICD-10-CM

## 2018-03-03 LAB — BASIC METABOLIC PANEL
BUN / CREAT RATIO: 17 (ref 12–28)
BUN: 19 mg/dL (ref 8–27)
CALCIUM: 9.9 mg/dL (ref 8.7–10.3)
CHLORIDE: 98 mmol/L (ref 96–106)
CO2: 24 mmol/L (ref 20–29)
Creatinine, Ser: 1.1 mg/dL — ABNORMAL HIGH (ref 0.57–1.00)
GFR calc Af Amer: 53 mL/min/{1.73_m2} — ABNORMAL LOW (ref >59)
GFR calc non Af Amer: 46 mL/min/{1.73_m2} — ABNORMAL LOW (ref >59)
GLUCOSE: 103 mg/dL — AB (ref 65–99)
Potassium: 4.9 mmol/L (ref 3.5–5.2)
Sodium: 135 mmol/L (ref 134–144)

## 2018-03-03 NOTE — Patient Instructions (Signed)
Medication Instructions:  1. Your physician recommends that you continue on your current medications as directed. Please refer to the Current Medication list given to you today.   Labwork: TODAY BMET  Testing/Procedures: NONE ORDERED TODAY  Follow-Up: DR. Johnsie Cancel IN 1 YEAR; WE WILL SEND OUT A REMINDER LETTER   Any Other Special Instructions Will Be Listed Below (If Applicable).     If you need a refill on your cardiac medications before your next appointment, please call your pharmacy.

## 2018-03-10 ENCOUNTER — Ambulatory Visit (INDEPENDENT_AMBULATORY_CARE_PROVIDER_SITE_OTHER): Payer: Medicare Other | Admitting: *Deleted

## 2018-03-10 DIAGNOSIS — Z7901 Long term (current) use of anticoagulants: Secondary | ICD-10-CM

## 2018-03-10 DIAGNOSIS — I482 Chronic atrial fibrillation, unspecified: Secondary | ICD-10-CM

## 2018-03-10 LAB — POCT INR: INR: 1.9

## 2018-03-10 NOTE — Patient Instructions (Signed)
Description   Today take 2 tablets, then Continue  same dose of coumadin  1 tablet daily except 1.5 tablets on Mondays.  Recheck in 2-3 weeks.  Call our office if you have any concerns or if you are put on any new medications [phone]

## 2018-03-12 ENCOUNTER — Other Ambulatory Visit: Payer: Self-pay | Admitting: *Deleted

## 2018-03-12 MED ORDER — WARFARIN SODIUM 5 MG PO TABS: ORAL_TABLET | ORAL | 3 refills | Status: DC

## 2018-03-12 MED ORDER — FUROSEMIDE 40 MG PO TABS: 40.0000 mg | ORAL_TABLET | Freq: Every day | ORAL | 3 refills | Status: DC

## 2018-03-28 ENCOUNTER — Ambulatory Visit (INDEPENDENT_AMBULATORY_CARE_PROVIDER_SITE_OTHER): Payer: Medicare Other | Admitting: *Deleted

## 2018-03-28 DIAGNOSIS — Z7901 Long term (current) use of anticoagulants: Secondary | ICD-10-CM | POA: Diagnosis not present

## 2018-03-28 DIAGNOSIS — I482 Chronic atrial fibrillation, unspecified: Secondary | ICD-10-CM

## 2018-03-28 LAB — POCT INR: INR: 2.7

## 2018-03-28 NOTE — Patient Instructions (Signed)
Description   Continue same dose of coumadin 1 tablet daily except 1.5 tablets on Mondays.  Recheck in 3 weeks. Call our office if you have any concerns or if you are put on any new medications [phone]

## 2018-04-01 ENCOUNTER — Other Ambulatory Visit: Payer: Self-pay | Admitting: Cardiovascular Disease

## 2018-04-18 ENCOUNTER — Ambulatory Visit (INDEPENDENT_AMBULATORY_CARE_PROVIDER_SITE_OTHER): Payer: Medicare Other | Admitting: Pharmacist

## 2018-04-18 DIAGNOSIS — I482 Chronic atrial fibrillation, unspecified: Secondary | ICD-10-CM

## 2018-04-18 DIAGNOSIS — Z7901 Long term (current) use of anticoagulants: Secondary | ICD-10-CM | POA: Diagnosis not present

## 2018-04-18 LAB — POCT INR: INR: 2.3 (ref 2.0–3.0)

## 2018-04-18 MED ORDER — WARFARIN SODIUM 5 MG PO TABS: ORAL_TABLET | ORAL | 1 refills | Status: DC

## 2018-04-18 NOTE — Patient Instructions (Signed)
Description   Continue same dose of coumadin 1 tablet daily except 1.5 tablets on Mondays.  Recheck in 4 weeks. Call our office if you have any concerns or if you are put on any new medications [phone]

## 2018-04-29 ENCOUNTER — Telehealth: Payer: Self-pay | Admitting: Family Medicine

## 2018-04-29 NOTE — Telephone Encounter (Signed)
Sent to PCP to advise 

## 2018-04-29 NOTE — Telephone Encounter (Signed)
Copied from [place] [id number]. Topic: Quick Communication - See Telephone Encounter >> Apr 29, 2018 11:41 AM Bea Graff, NT wrote: CRM for notification. See Telephone encounter for: 04/29/18. Corene Cornea with [facility] calling and states that the meloxicam (MOBIC) 7.5 MG tablet taken with warfarin that the pt is also on can cause an increase in GI bleeds and he would like to see if the pt should take this medication or if Dr. Sarajane Jews would like to stop this medication. CB#: [phone] Ref#: [id number]

## 2018-04-30 MED ORDER — ACETAMINOPHEN 500 MG PO TABS: 1000.0000 mg | ORAL_TABLET | Freq: Two times a day (BID) | ORAL | 0 refills | Status: DC | PRN

## 2018-04-30 NOTE — Telephone Encounter (Signed)
Called and spoke with pt. Pt advised and voiced understanding. Pt medication list has been updated.

## 2018-04-30 NOTE — Telephone Encounter (Signed)
I agree. Have the patient stop the Meloxicam and use ES Tylenol as needed for pain. She may take 1000 mg bid prn

## 2018-05-07 ENCOUNTER — Telehealth: Payer: Self-pay | Admitting: Family Medicine

## 2018-05-07 ENCOUNTER — Other Ambulatory Visit: Payer: Self-pay | Admitting: Cardiovascular Disease

## 2018-05-07 NOTE — Telephone Encounter (Signed)
Copied from [place] [phone]. Topic: Quick Communication - See Telephone Encounter >> May 07, 2018  3:18 PM Cleaster Corin, NT wrote: CRM for notification. See Telephone encounter for: 05/07/18.  Johnathan from [facility]. Calling about a drug interaction with med. warfarin (COUMADIN) 5 MG tablet [[id number]] reference number [id number]   [place], [place] [facility] [address] [place] [address] Phone: [phone] Fax: [phone]

## 2018-05-08 ENCOUNTER — Other Ambulatory Visit: Payer: Self-pay

## 2018-05-08 NOTE — Telephone Encounter (Signed)
Sent to PCP to advise 

## 2018-05-08 NOTE — Telephone Encounter (Signed)
I spoke with pharmacy and pt is currently taking Mobic, this can increase chances of bleeding. They need to confirm okay to fill the Coumadin?

## 2018-05-08 NOTE — Telephone Encounter (Signed)
I took care of this last week. The Meloxicam was stopped

## 2018-05-08 NOTE — Telephone Encounter (Signed)
Called [facility] to advise them that pt is aware NOT to take this medication. Medication has been removed from pt's medication list. [facility] aware to removed the prescription.

## 2018-05-16 ENCOUNTER — Ambulatory Visit (INDEPENDENT_AMBULATORY_CARE_PROVIDER_SITE_OTHER): Payer: Medicare Other | Admitting: *Deleted

## 2018-05-16 DIAGNOSIS — I482 Chronic atrial fibrillation, unspecified: Secondary | ICD-10-CM

## 2018-05-16 DIAGNOSIS — Z7901 Long term (current) use of anticoagulants: Secondary | ICD-10-CM | POA: Diagnosis not present

## 2018-05-16 LAB — POCT INR: INR: 2.6 (ref 2.0–3.0)

## 2018-05-16 NOTE — Patient Instructions (Signed)
Description   Continue same dose of coumadin 1 tablet daily except 1.5 tablets on Mondays.  Recheck in 5 weeks. Call our office if you have any concerns or if you are put on any new medications [phone]

## 2018-06-13 ENCOUNTER — Encounter: Payer: Self-pay | Admitting: Family Medicine

## 2018-06-13 ENCOUNTER — Ambulatory Visit (INDEPENDENT_AMBULATORY_CARE_PROVIDER_SITE_OTHER): Payer: Medicare Other | Admitting: Family Medicine

## 2018-06-13 VITALS — BP 128/76 | HR 104 | Temp 98.4°F | Ht 71.0 in | Wt 180.0 lb

## 2018-06-13 DIAGNOSIS — E039 Hypothyroidism, unspecified: Secondary | ICD-10-CM

## 2018-06-13 DIAGNOSIS — R61 Generalized hyperhidrosis: Secondary | ICD-10-CM

## 2018-06-13 LAB — BASIC METABOLIC PANEL
BUN: 26 mg/dL — AB (ref 6–23)
CO2: 29 mEq/L (ref 19–32)
Calcium: 10.2 mg/dL (ref 8.4–10.5)
Chloride: 103 mEq/L (ref 96–112)
Creatinine, Ser: 0.99 mg/dL (ref 0.40–1.20)
GFR: 56.36 mL/min — ABNORMAL LOW (ref >60.00)
GLUCOSE: 79 mg/dL (ref 70–99)
POTASSIUM: 5.2 meq/L — AB (ref 3.5–5.1)
SODIUM: 140 meq/L (ref 135–145)

## 2018-06-13 LAB — HEPATIC FUNCTION PANEL
ALBUMIN: 4.8 g/dL (ref 3.5–5.2)
ALK PHOS: 82 U/L (ref 39–117)
ALT: 14 U/L (ref 0–35)
AST: 17 U/L (ref 0–37)
Bilirubin, Direct: 0.3 mg/dL (ref 0.0–0.3)
Total Bilirubin: 1.1 mg/dL (ref 0.2–1.2)
Total Protein: 7.6 g/dL (ref 6.0–8.3)

## 2018-06-13 LAB — POC URINALSYSI DIPSTICK (AUTOMATED)
BILIRUBIN UA: NEGATIVE
Glucose, UA: NEGATIVE
KETONES UA: NEGATIVE
Leukocytes, UA: NEGATIVE
Nitrite, UA: NEGATIVE
PH UA: 6 (ref 5.0–8.0)
PROTEIN UA: NEGATIVE
RBC UA: POSITIVE
Spec Grav, UA: 1.015 (ref 1.010–1.025)
Urobilinogen, UA: 0.2 E.U./dL

## 2018-06-13 LAB — CBC WITH DIFFERENTIAL/PLATELET
BASOS PCT: 0.6 % (ref 0.0–3.0)
Basophils Absolute: 0 10*3/uL (ref 0.0–0.1)
EOS ABS: 0.2 10*3/uL (ref 0.0–0.7)
EOS PCT: 3 % (ref 0.0–5.0)
HEMATOCRIT: 39.1 % (ref 36.0–46.0)
HEMOGLOBIN: 13.1 g/dL (ref 12.0–15.0)
LYMPHS PCT: 22.8 % (ref 12.0–46.0)
Lymphs Abs: 1.7 10*3/uL (ref 0.7–4.0)
MCHC: 33.6 g/dL (ref 30.0–36.0)
MCV: 93.9 fl (ref 78.0–100.0)
MONO ABS: 0.6 10*3/uL (ref 0.1–1.0)
Monocytes Relative: 8.8 % (ref 3.0–12.0)
Neutro Abs: 4.8 10*3/uL (ref 1.4–7.7)
Neutrophils Relative %: 64.8 % (ref 43.0–77.0)
Platelets: 254 10*3/uL (ref 150.0–400.0)
RBC: 4.17 Mil/uL (ref 3.87–5.11)
RDW: 13.6 % (ref 11.5–15.5)
WBC: 7.4 10*3/uL (ref 4.0–10.5)

## 2018-06-13 LAB — TSH: TSH: 0.12 u[IU]/mL — AB (ref 0.35–4.50)

## 2018-06-13 LAB — T3, FREE: T3, Free: 3.5 pg/mL (ref 2.3–4.2)

## 2018-06-13 LAB — T4, FREE: FREE T4: 1.26 ng/dL (ref 0.60–1.60)

## 2018-06-13 MED ORDER — SYNTHROID 100 MCG PO TABS: 100.0000 ug | ORAL_TABLET | Freq: Every day | ORAL | 3 refills | Status: DC

## 2018-06-13 NOTE — Progress Notes (Signed)
   Subjective:    Patient ID: [name], female    DOB: 1929/10/24, 84 y.o.   MRN: [id number]  HPI Here for 4 weeks of night sweats. She wakes up feeling hot and is soaked with sweat. She feels well during the day. She notes that she switched from name brand Synthroid to generic Levothyroxine about 6 weeks ago at the request of her mail order pharmacy. No recent weight changes. She feels fine every other way.    Review of Systems  Constitutional: Positive for diaphoresis. Negative for fever and unexpected weight change.  HENT: Negative.   Eyes: Negative.   Respiratory: Negative.   Cardiovascular: Negative.   Gastrointestinal: Negative.   Endocrine: Negative.   Genitourinary: Negative.        Objective:   Physical Exam  Constitutional: She is oriented to person, place, and time. She appears well-developed and well-nourished.  HENT:  Right Ear: External ear normal.  Left Ear: External ear normal.  Nose: Nose normal.  Mouth/Throat: Oropharynx is clear and moist.  Eyes: Conjunctivae are normal.  Neck: No thyromegaly present.  Cardiovascular: Normal rate, regular rhythm, normal heart sounds and intact distal pulses.  Pulmonary/Chest: Effort normal and breath sounds normal. No stridor. No respiratory distress. She has no wheezes. She has no rales.  Abdominal: Soft. Bowel sounds are normal. She exhibits no distension and no mass. There is no tenderness. There is no rebound and no guarding. No hernia.  Lymphadenopathy:    She has no cervical adenopathy.  Neurological: She is alert and oriented to person, place, and time.          Assessment & Plan:  Recent onset of night sweats. This could be related to the recent change of medication, so we will check a full thyroid panel today. We will change her back to name brand Synthroid only. We will also get other labs including a UA to check for hidden infections.  Alysia Penna, MD

## 2018-06-20 ENCOUNTER — Ambulatory Visit (INDEPENDENT_AMBULATORY_CARE_PROVIDER_SITE_OTHER): Payer: Medicare Other

## 2018-06-20 DIAGNOSIS — I482 Chronic atrial fibrillation, unspecified: Secondary | ICD-10-CM

## 2018-06-20 DIAGNOSIS — Z7901 Long term (current) use of anticoagulants: Secondary | ICD-10-CM

## 2018-06-20 DIAGNOSIS — I481 Persistent atrial fibrillation: Secondary | ICD-10-CM | POA: Diagnosis not present

## 2018-06-20 DIAGNOSIS — I4819 Other persistent atrial fibrillation: Secondary | ICD-10-CM

## 2018-06-20 LAB — POCT INR: INR: 4.1 — AB (ref 2.0–3.0)

## 2018-06-20 NOTE — Patient Instructions (Signed)
Description   Skip today's dosage of Coumadin, then resume same dosage 1 tablet daily except 1.5 tablets on Mondays.  Recheck in 2 weeks. Call our office if you have any concerns or if you are put on any new medications [phone]

## 2018-07-01 DIAGNOSIS — H35362 Drusen (degenerative) of macula, left eye: Secondary | ICD-10-CM | POA: Diagnosis not present

## 2018-07-01 DIAGNOSIS — H353131 Nonexudative age-related macular degeneration, bilateral, early dry stage: Secondary | ICD-10-CM | POA: Diagnosis not present

## 2018-07-01 DIAGNOSIS — H35351 Cystoid macular degeneration, right eye: Secondary | ICD-10-CM | POA: Diagnosis not present

## 2018-07-01 DIAGNOSIS — H35371 Puckering of macula, right eye: Secondary | ICD-10-CM | POA: Diagnosis not present

## 2018-07-04 ENCOUNTER — Ambulatory Visit (INDEPENDENT_AMBULATORY_CARE_PROVIDER_SITE_OTHER): Payer: Medicare Other

## 2018-07-04 DIAGNOSIS — I482 Chronic atrial fibrillation, unspecified: Secondary | ICD-10-CM

## 2018-07-04 DIAGNOSIS — Z7901 Long term (current) use of anticoagulants: Secondary | ICD-10-CM

## 2018-07-04 LAB — POCT INR: INR: 3.1 — AB (ref 2.0–3.0)

## 2018-07-04 NOTE — Patient Instructions (Signed)
Description   Take 1/2 tablet today, then resume same dosage 1 tablet daily except 1.5 tablets on Mondays.  Recheck in 4 weeks. Call our office if you have any concerns or if you are put on any new medications [phone]

## 2018-07-08 ENCOUNTER — Ambulatory Visit (INDEPENDENT_AMBULATORY_CARE_PROVIDER_SITE_OTHER): Payer: Medicare Other | Admitting: Family Medicine

## 2018-07-08 ENCOUNTER — Encounter: Payer: Self-pay | Admitting: Family Medicine

## 2018-07-08 VITALS — BP 128/78 | HR 95 | Temp 96.0°F | Ht 71.0 in | Wt 182.6 lb

## 2018-07-08 DIAGNOSIS — R300 Dysuria: Secondary | ICD-10-CM | POA: Diagnosis not present

## 2018-07-08 DIAGNOSIS — N390 Urinary tract infection, site not specified: Secondary | ICD-10-CM

## 2018-07-08 LAB — POCT URINALYSIS DIPSTICK
Bilirubin, UA: NEGATIVE
Blood, UA: NEGATIVE
GLUCOSE UA: NEGATIVE
KETONES UA: NEGATIVE
Leukocytes, UA: NEGATIVE
NITRITE UA: NEGATIVE
PROTEIN UA: NEGATIVE
Spec Grav, UA: 1.015 (ref 1.010–1.025)
Urobilinogen, UA: 0.2 E.U./dL
pH, UA: 6 (ref 5.0–8.0)

## 2018-07-08 MED ORDER — CIPROFLOXACIN HCL 500 MG PO TABS: 500.0000 mg | ORAL_TABLET | Freq: Two times a day (BID) | ORAL | 0 refills | Status: DC

## 2018-07-08 NOTE — Progress Notes (Signed)
   Subjective:    Patient ID: [name], female    DOB: [birth year], 84 y.o.   MRN: [id number]  HPI Here for one week of increased frequency and urgency of urine. She has burning and there is a foul odor. No fever. She drinks lot of water.   Review of Systems  Constitutional: Negative.   Respiratory: Negative.   Cardiovascular: Negative.   Gastrointestinal: Negative.   Genitourinary: Positive for dysuria, frequency and urgency.       Objective:   Physical Exam  Constitutional: She appears well-developed and well-nourished.  Cardiovascular: Normal rate, regular rhythm, normal heart sounds and intact distal pulses.  Pulmonary/Chest: Effort normal and breath sounds normal.  Abdominal: Soft. Bowel sounds are normal. She exhibits no distension and no mass. There is no tenderness. There is no rebound and no guarding. No hernia.          Assessment & Plan:  UTI, treat with Cipro. Culture the sample.  Alysia Penna, MD

## 2018-07-10 LAB — URINE CULTURE
MICRO NUMBER:: 90960263
SPECIMEN QUALITY:: ADEQUATE

## 2018-08-01 ENCOUNTER — Ambulatory Visit (INDEPENDENT_AMBULATORY_CARE_PROVIDER_SITE_OTHER): Payer: Medicare Other | Admitting: *Deleted

## 2018-08-01 DIAGNOSIS — I482 Chronic atrial fibrillation, unspecified: Secondary | ICD-10-CM

## 2018-08-01 DIAGNOSIS — Z7901 Long term (current) use of anticoagulants: Secondary | ICD-10-CM

## 2018-08-01 LAB — POCT INR: INR: 3.5 — AB (ref 2.0–3.0)

## 2018-08-01 NOTE — Patient Instructions (Signed)
Description   Skip today's dose, then start taking 1 tablet daily.   Recheck in 2 weeks. Call our office if you have any concerns or if you are put on any new medications [phone]

## 2018-08-06 DIAGNOSIS — G609 Hereditary and idiopathic neuropathy, unspecified: Secondary | ICD-10-CM | POA: Diagnosis not present

## 2018-08-06 DIAGNOSIS — M79672 Pain in left foot: Secondary | ICD-10-CM | POA: Diagnosis not present

## 2018-08-06 DIAGNOSIS — M14672 Charcot's joint, left ankle and foot: Secondary | ICD-10-CM | POA: Diagnosis not present

## 2018-08-15 ENCOUNTER — Ambulatory Visit (INDEPENDENT_AMBULATORY_CARE_PROVIDER_SITE_OTHER): Payer: Medicare Other | Admitting: Pharmacist

## 2018-08-15 ENCOUNTER — Encounter (INDEPENDENT_AMBULATORY_CARE_PROVIDER_SITE_OTHER): Payer: Self-pay

## 2018-08-15 DIAGNOSIS — I482 Chronic atrial fibrillation, unspecified: Secondary | ICD-10-CM

## 2018-08-15 DIAGNOSIS — Z7901 Long term (current) use of anticoagulants: Secondary | ICD-10-CM | POA: Diagnosis not present

## 2018-08-15 LAB — POCT INR: INR: 3.3 — AB (ref 2.0–3.0)

## 2018-08-15 NOTE — Patient Instructions (Signed)
Description   Take 1/2 tablet today, then start taking 1 tablet daily except for 1/2 tablet on Mondays.  Recheck in 2 weeks. Call our office if you have any concerns or if you are put on any new medications [phone]

## 2018-08-28 DIAGNOSIS — Z23 Encounter for immunization: Secondary | ICD-10-CM | POA: Diagnosis not present

## 2018-08-29 ENCOUNTER — Ambulatory Visit (INDEPENDENT_AMBULATORY_CARE_PROVIDER_SITE_OTHER): Payer: Medicare Other | Admitting: *Deleted

## 2018-08-29 DIAGNOSIS — Z7901 Long term (current) use of anticoagulants: Secondary | ICD-10-CM

## 2018-08-29 DIAGNOSIS — I482 Chronic atrial fibrillation, unspecified: Secondary | ICD-10-CM

## 2018-08-29 LAB — POCT INR: INR: 2.7 (ref 2.0–3.0)

## 2018-08-29 NOTE — Patient Instructions (Addendum)
Description   Continue taking 1 tablet daily except for 1/2 tablet on Mondays. Recheck in 3 weeks. Call our office if you have any concerns or if you are put on any new medications [phone]     

## 2018-09-01 ENCOUNTER — Other Ambulatory Visit: Payer: Self-pay | Admitting: Family Medicine

## 2018-09-02 NOTE — Telephone Encounter (Signed)
Call in #[phone] with 2 rf 

## 2018-09-02 NOTE — Telephone Encounter (Signed)
Dr. Fry please advise on refill. Thanks  

## 2018-09-03 DIAGNOSIS — M79672 Pain in left foot: Secondary | ICD-10-CM | POA: Diagnosis not present

## 2018-09-03 DIAGNOSIS — M14672 Charcot's joint, left ankle and foot: Secondary | ICD-10-CM | POA: Diagnosis not present

## 2018-09-04 DIAGNOSIS — Z124 Encounter for screening for malignant neoplasm of cervix: Secondary | ICD-10-CM | POA: Diagnosis not present

## 2018-09-04 DIAGNOSIS — R232 Flushing: Secondary | ICD-10-CM | POA: Diagnosis not present

## 2018-09-04 DIAGNOSIS — Z6826 Body mass index (BMI) 26.0-26.9, adult: Secondary | ICD-10-CM | POA: Diagnosis not present

## 2018-09-17 ENCOUNTER — Other Ambulatory Visit: Payer: Self-pay | Admitting: Cardiovascular Disease

## 2018-09-18 ENCOUNTER — Ambulatory Visit (INDEPENDENT_AMBULATORY_CARE_PROVIDER_SITE_OTHER): Payer: Medicare Other | Admitting: *Deleted

## 2018-09-18 DIAGNOSIS — Z7901 Long term (current) use of anticoagulants: Secondary | ICD-10-CM | POA: Diagnosis not present

## 2018-09-18 DIAGNOSIS — I482 Chronic atrial fibrillation, unspecified: Secondary | ICD-10-CM

## 2018-09-18 LAB — POCT INR: INR: 2.3 (ref 2.0–3.0)

## 2018-09-18 NOTE — Patient Instructions (Signed)
Description   Continue taking 1 tablet daily except for 1/2 tablet on Mondays.  Recheck in 4 weeks. Call our office if you have any concerns or if you are put on any new medications [phone]

## 2018-09-24 ENCOUNTER — Other Ambulatory Visit: Payer: Self-pay | Admitting: Cardiovascular Disease

## 2018-10-17 ENCOUNTER — Ambulatory Visit (INDEPENDENT_AMBULATORY_CARE_PROVIDER_SITE_OTHER): Payer: Medicare Other | Admitting: Pharmacist

## 2018-10-17 DIAGNOSIS — I482 Chronic atrial fibrillation, unspecified: Secondary | ICD-10-CM | POA: Diagnosis not present

## 2018-10-17 DIAGNOSIS — Z7901 Long term (current) use of anticoagulants: Secondary | ICD-10-CM

## 2018-10-17 LAB — POCT INR: INR: 2.1 (ref 2.0–3.0)

## 2018-10-17 NOTE — Patient Instructions (Signed)
Description   Continue taking 1 tablet daily except for 1/2 tablet on Mondays.  Recheck in 6 weeks. Call our office if you have any concerns or if you are put on any new medications [phone]

## 2018-10-30 ENCOUNTER — Ambulatory Visit (INDEPENDENT_AMBULATORY_CARE_PROVIDER_SITE_OTHER): Payer: Medicare Other | Admitting: Internal Medicine

## 2018-10-30 ENCOUNTER — Other Ambulatory Visit: Payer: Medicare Other

## 2018-10-30 ENCOUNTER — Encounter: Payer: Self-pay | Admitting: Internal Medicine

## 2018-10-30 VITALS — BP 130/70 | HR 77 | Temp 98.1°F | Ht 71.0 in | Wt 183.0 lb

## 2018-10-30 DIAGNOSIS — R5383 Other fatigue: Secondary | ICD-10-CM

## 2018-10-30 DIAGNOSIS — R300 Dysuria: Secondary | ICD-10-CM

## 2018-10-30 DIAGNOSIS — J029 Acute pharyngitis, unspecified: Secondary | ICD-10-CM | POA: Diagnosis not present

## 2018-10-30 LAB — POCT URINALYSIS DIPSTICK
BILIRUBIN UA: NEGATIVE
Glucose, UA: NEGATIVE
Ketones, UA: NEGATIVE
Leukocytes, UA: NEGATIVE
Nitrite, UA: NEGATIVE
PH UA: 6 (ref 5.0–8.0)
Protein, UA: NEGATIVE
RBC UA: POSITIVE
SPEC GRAV UA: 1.01 (ref 1.010–1.025)
UROBILINOGEN UA: 0.2 U/dL

## 2018-10-30 MED ORDER — AMOXICILLIN-POT CLAVULANATE 875-125 MG PO TABS: 1.0000 | ORAL_TABLET | Freq: Two times a day (BID) | ORAL | 0 refills | Status: DC

## 2018-10-30 MED ORDER — CETIRIZINE HCL 10 MG PO TABS: 10.0000 mg | ORAL_TABLET | Freq: Every day | ORAL | 0 refills | Status: DC

## 2018-10-30 NOTE — Assessment & Plan Note (Signed)
Suspect related to viral illness currently. No labs today but is not improved in 1-2 weeks needs repeat visit and evaluation. No blood in stool or bleeding to suggest need for CBC today.

## 2018-10-30 NOTE — Assessment & Plan Note (Signed)
Does not appear to be strep however given history will treat with 5 day course augmentin. Asked to call coumadin clinic to get recheck in 1-2 weeks as this can affect INR levels.

## 2018-10-30 NOTE — Patient Instructions (Signed)
We have sent in augmentin to take 1 pill twice a day for 5 days. You do not need to change the dose of the coumadin with this.   Try getting zyrtec over the counter to take 1 pill daily to help with drainage.

## 2018-10-30 NOTE — Progress Notes (Addendum)
   Subjective:    Patient ID: [name], female    DOB: 07/10/1929, 84 y.o.   MRN: [id number]  HPI The patient is an 84 YO female coming in for several concerns including possible UTI (mild burning with urination, denies fevers or chills, denies nausea or vomiting, denies abdominal pain or back pain, prior uti in the past which made her worried) and sore throat (started Monday night or Tuesday with more voice change than pain, some drainage which was nose drainage and yellow, this concerned her, she has a lot of history of bronchitis and pneumonia and was worried, minimal cough, denies worsening SOB, denies chills or fever, taking tylenol for arthritis but nothing for this, denies taking allergy medicine, with warfarin she did not want to take anything otc, denies headaches or sinus pain) and fatigue (she started feeling poorly Monday and has had limited energy since that time, denies fevers but states she takes tylenol for arthritis most days, states she normally runs 95 degrees so was 98 degrees could be fever for her, denies sweats or chills, denies SOB other than usual SOB, some cough, see sore throat for more HPI).   Review of Systems  Constitutional: Positive for activity change, appetite change and fatigue. Negative for chills, fever and unexpected weight change.  HENT: Positive for congestion, postnasal drip, rhinorrhea, sore throat and voice change. Negative for ear discharge, ear pain, sinus pressure, sinus pain, sneezing, tinnitus and trouble swallowing.   Eyes: Negative.   Respiratory: Positive for cough. Negative for chest tightness, shortness of breath and wheezing.   Cardiovascular: Negative.   Gastrointestinal: Negative.  Negative for abdominal distention, constipation, diarrhea, nausea and vomiting.  Genitourinary: Positive for dysuria and frequency. Negative for difficulty urinating, flank pain and urgency.  Musculoskeletal: Positive for arthralgias.  Skin: Negative.     Neurological: Negative.       Objective:   Physical Exam  Constitutional: She is oriented to person, place, and time. She appears well-developed and well-nourished.  HENT:  Head: Normocephalic and atraumatic.  Oropharynx with redness and clear drainage, nose with swollen turbinates, TMs normal bilaterally  Eyes: EOM are normal.  Neck: Normal range of motion. No thyromegaly present.  Cardiovascular: Normal rate and regular rhythm.  Murmur heard. Pulmonary/Chest: Effort normal and breath sounds normal. No respiratory distress. She has no wheezes. She has no rales.  Abdominal: Soft.  No suprapubic pain  Musculoskeletal: She exhibits tenderness.  Lymphadenopathy:    She has no cervical adenopathy.  Neurological: She is alert and oriented to person, place, and time.  Skin: Skin is warm and dry.   Vitals:   10/30/18 1042  BP: 130/70  Pulse: 77  Temp: 98.1 F (36.7 C)  TempSrc: Oral  SpO2: 97%  Weight: 183 lb (83 kg)  Height: 5\' 11"  (1.803 m)      Assessment & Plan:

## 2018-10-30 NOTE — Assessment & Plan Note (Signed)
U/A done in the office without signs of infection. Given warfarin would not like to treat if not needed. Small hemoglobin (past CT without kidney stones 2018 and no clinical signs). Checking urine culture to rule out infection and treat as appropriate.

## 2018-11-01 LAB — URINE CULTURE
MICRO NUMBER:: 91457872
SPECIMEN QUALITY:: ADEQUATE

## 2018-11-13 ENCOUNTER — Other Ambulatory Visit: Payer: Self-pay | Admitting: Cardiovascular Disease

## 2018-11-28 ENCOUNTER — Ambulatory Visit (INDEPENDENT_AMBULATORY_CARE_PROVIDER_SITE_OTHER): Payer: Medicare Other

## 2018-11-28 DIAGNOSIS — Z7901 Long term (current) use of anticoagulants: Secondary | ICD-10-CM

## 2018-11-28 DIAGNOSIS — I482 Chronic atrial fibrillation, unspecified: Secondary | ICD-10-CM

## 2018-11-28 LAB — POCT INR: INR: 3 (ref 2.0–3.0)

## 2018-11-28 NOTE — Patient Instructions (Signed)
Description   Continue on same dosage 1 tablet daily except for 1/2 tablet on Mondays.  Recheck in 6 weeks. Call our office if you have any concerns or if you are put on any new medications [phone]

## 2018-12-02 NOTE — Progress Notes (Signed)
Cardiology Office Note:    Date:  12/04/2018   ID:  [name], DOB [birth year], MRN [id number]  PCP:  Laurey Morale, MD  Cardiologist:  Jenkins Rouge, MD  Referring MD: Laurey Morale, MD   No chief complaint on file.   History of Present Illness:    85 y.o. long standing chronic afib on coumadin. HTN, HLD. History of colon cancer with chemo in 1994. LE edema with Charcot foot. Had pneumonia February 2019 and BP meds held post d/c for azotemia. BP high last visit and meds resumed with improvement Cr back to normal .99 in Jun 26, 2018  Oldest sister died in [place] at age of 72 Sister [name] will likely need memory care unit soon Not doing well after stroke [name] indicated not wanting to be on life support if she is ill in future      Past Medical History:  Diagnosis Date  . Anxiety   . Atrial fibrillation ([place])   . Cancer ([place])   . Cataract    REMOVED BILATERAL  . Chest pain, unspecified   . Chronic low back pain   . Diverticulosis of colon   . History of colon cancer   . History of colonic polyps   . History of colonoscopy   . History of uterine cancer   . Hyperlipidemia   . Hypertension   . Hypothyroid   . Impaired glucose tolerance   . Mitral valve disorder   . Osteoarthritis   . Osteoporosis   . Patent foramen ovale   . Peripheral neuropathy   . Peripheral vascular disease ([place])   . Splenic infarction   . UTI (lower urinary tract infection)   . Venous insufficiency   . Vitamin B12 deficiency     Past Surgical History:  Procedure Laterality Date  . ANTERIOR AND POSTERIOR VAGINAL REPAIR  03/2006   Dr.Neal  . basal cell skin cancer Moh's surgery    . CATARACT EXTRACTION, BILATERAL    . COLONOSCOPY  05-25-10   per Dr. Henrene Pastor, benign polyps, repeat in 5 yrs   . EYE SURGERY    . hysterectomy for uterine cancer    . left carpal tunnel surgery  02/2007   Dr. Daylene Katayama  . lumbar laminectomy for spinal stenosis  2006   L3-4  . RETINAL DETACHMENT REPAIR W/ SCLERAL  [place] [facility] Right 03-01-14   per Dr. Zadie Rhine   . right hemicolectomy for colon cancer  1994    Current Medications: Current Meds  Medication Sig  . acetaminophen (TYLENOL) 500 MG tablet Take 2 tablets (1,000 mg total) by mouth 2 (two) times daily as needed.  . ALPRAZolam (XANAX) 0.25 MG tablet TAKE 1 TABLET BY MOUTH AT BEDTIME AS NEEDED  . atorvastatin (LIPITOR) 20 MG tablet TAKE 1 TABLET BY MOUTH  DAILY  . Calcium Carbonate-Vitamin D (CALTRATE 600+D) 600-400 MG-UNIT per tablet Take 1 tablet by mouth daily.    . carvedilol (COREG) 6.25 MG tablet Take 1 tablet (6.25 mg total) by mouth 2 (two) times daily.  . cetirizine (ZYRTEC) 10 MG tablet Take 1 tablet (10 mg total) by mouth daily.  . furosemide (LASIX) 40 MG tablet Take 1 tablet (40 mg total) by mouth daily.  Marland Kitchen ketorolac (ACULAR) 0.5 % ophthalmic solution Place 1 drop into the right eye 2 (two) times daily.  . Multiple Vitamins-Minerals (CENTRUM SILVER PO) Take 1 tablet by mouth daily.    . potassium chloride SA (K-DUR,KLOR-CON) 20 MEQ tablet TAKE 1 TABLET BY  MOUTH TWO  TIMES DAILY  . quinapril (ACCUPRIL) 20 MG tablet Take 1 tablet (20 mg total) by mouth at bedtime.  Marland Kitchen spironolactone (ALDACTONE) 25 MG tablet TAKE 1 TABLET DAILY  . SYNTHROID 100 MCG tablet Take 1 tablet (100 mcg total) by mouth daily before breakfast.  . vitamin B-12 (CYANOCOBALAMIN) 1000 MCG tablet Take 1,000 mcg by mouth daily.    Marland Kitchen warfarin (COUMADIN) 5 MG tablet TAKE 1 TO 1.5 TABLETS BY  MOUTH DAILY AS DIRECTED BY  COUMDAIN CLINIC     Allergies:   Codeine   Social History   Socioeconomic History  . Marital status: Widowed    Spouse name: Not on file  . Number of children: 0  . Years of education: Not on file  . Highest education level: Not on file  Occupational History  . Occupation: retired    Comment: part time at [employer]  . Financial resource strain: Not on file  . Food insecurity:    Worry: Not on file    Inability: Not on file  .  Transportation needs:    Medical: Not on file    Non-medical: Not on file  Tobacco Use  . Smoking status: Never Smoker  . Smokeless tobacco: Never Used  Substance and Sexual Activity  . Alcohol use: Yes    Alcohol/week: 0.0 standard drinks    Comment: occ  . Drug use: No  . Sexual activity: Never  Lifestyle  . Physical activity:    Days per week: Not on file    Minutes per session: Not on file  . Stress: Not on file  Relationships  . Social connections:    Talks on phone: Not on file    Gets together: Not on file    Attends religious service: Not on file    Active member of club or organization: Not on file    Attends meetings of clubs or organizations: Not on file    Relationship status: Not on file  Other Topics Concern  . Not on file  Social History Narrative  . Not on file     Family History: The patient's family history includes Cancer in her brother and mother; Heart disease in her sister; Heart failure in her father; Stroke in her sister. ROS:   Please see the history of present illness.   All other systems reviewed and are negative.  EKGs/Labs/Other Studies Reviewed:    The following studies were reviewed today:  Echocardiogram 12/12/17 Study Conclusions  - Left ventricle: The cavity size was normal. Wall thickness was   normal. Systolic function was normal. The estimated ejection   fraction was in the range of 60% to 65%. - Ventricular septum: The contour showed diastolic flattening. - Aortic valve: There was trivial regurgitation. - Mitral valve: The findings are consistent with moderate stenosis.   There was mild to moderate regurgitation. Valve area by pressure   half-time: 1.03 cm^2. Valve area by continuity equation (using   LVOT flow): 0.86 cm^2. - Left atrium: The atrium was severely dilated. - Right ventricle: The cavity size was moderately dilated. Systolic   function was mildly to moderately reduced. - Right atrium: The atrium was moderately  dilated. - Tricuspid valve: There was mild-moderate regurgitation. - Pulmonary arteries: Systolic pressure was moderately increased.   PA peak pressure: 44 mm Hg (S).  EKG:   01/16/18 Afib rate 88 nonspecific ST changes   Recent Labs: 01/16/2018: B Natriuretic Peptide 283.4 06/13/2018: ALT 14; BUN 26;  Creatinine, Ser 0.99; Hemoglobin 13.1; Platelets 254.0; Potassium 5.2; Sodium 140; TSH 0.12   Recent Lipid Panel    Component Value Date/Time   CHOL 127 11/20/2017 1137   TRIG 99.0 11/20/2017 1137   HDL 38.80 (L) 11/20/2017 1137   CHOLHDL 3 11/20/2017 1137   VLDL 19.8 11/20/2017 1137   LDLCALC 68 11/20/2017 1137    Physical Exam:    VS:  BP 124/68   Pulse 87   Ht 5\' 11"  (1.803 m)   Wt 181 lb (82.1 kg)   SpO2 94%   BMI 25.24 kg/m     Wt Readings from Last 3 Encounters:  12/04/18 181 lb (82.1 kg)  10/30/18 183 lb (83 kg)  07/08/18 182 lb 9.6 oz (82.8 kg)    Affect appropriate Healthy:  appears stated age HEENT: normal Neck supple with no adenopathy JVP normal no bruits no thyromegaly Lungs clear with no wheezing and good diaphragmatic motion Heart:  S1/S2 AS  murmur, no rub, gallop or click PMI normal Abdomen: benighn, BS positve, no tenderness, no AAA no bruit.  No HSM or HJR Distal pulses intact with no bruits No edema Neuro non-focal Skin warm and dry No muscular weakness    ASSESSMENT:    1. Essential hypertension   2. Mixed hyperlipidemia   3. Permanent atrial fibrillation   4. Chronic diastolic CHF (congestive heart failure) (HCC)    PLAN:    In order of problems listed above:  Hypertension:  Improved now that she is back on proper meds   Permanent atrial fibrillation: chronic since 1991 good rate control and anticoagulation   Hypercholesterolemia: on statin LDL 66 at goal continue .   Chronic diastolic CHF: Currently euvolemic and asymptomatic. Continue current therapy.   AS:  Mean gradient 13 mmHg peak 25 mmHg TTE done 12/12/17 no new symptoms  or change in murmur will observe    F/U in 6 months  Jenkins Rouge

## 2018-12-03 ENCOUNTER — Other Ambulatory Visit: Payer: Self-pay | Admitting: Cardiovascular Disease

## 2018-12-04 ENCOUNTER — Ambulatory Visit (INDEPENDENT_AMBULATORY_CARE_PROVIDER_SITE_OTHER): Payer: Medicare Other | Admitting: Cardiovascular Disease

## 2018-12-04 ENCOUNTER — Encounter: Payer: Self-pay | Admitting: Cardiovascular Disease

## 2018-12-04 VITALS — BP 124/68 | HR 87 | Ht 71.0 in | Wt 181.0 lb

## 2018-12-04 DIAGNOSIS — I5032 Chronic diastolic (congestive) heart failure: Secondary | ICD-10-CM

## 2018-12-04 DIAGNOSIS — E782 Mixed hyperlipidemia: Secondary | ICD-10-CM | POA: Diagnosis not present

## 2018-12-04 DIAGNOSIS — I10 Essential (primary) hypertension: Secondary | ICD-10-CM

## 2018-12-04 DIAGNOSIS — I4821 Permanent atrial fibrillation: Secondary | ICD-10-CM

## 2018-12-04 NOTE — Patient Instructions (Addendum)

## 2018-12-15 ENCOUNTER — Other Ambulatory Visit: Payer: Self-pay | Admitting: Cardiovascular Disease

## 2018-12-22 DIAGNOSIS — H5213 Myopia, bilateral: Secondary | ICD-10-CM | POA: Diagnosis not present

## 2018-12-22 DIAGNOSIS — Z961 Presence of intraocular lens: Secondary | ICD-10-CM | POA: Diagnosis not present

## 2018-12-22 DIAGNOSIS — H524 Presbyopia: Secondary | ICD-10-CM | POA: Diagnosis not present

## 2018-12-22 DIAGNOSIS — H31011 Macula scars of posterior pole (postinflammatory) (post-traumatic), right eye: Secondary | ICD-10-CM | POA: Diagnosis not present

## 2018-12-22 DIAGNOSIS — H52203 Unspecified astigmatism, bilateral: Secondary | ICD-10-CM | POA: Diagnosis not present

## 2018-12-30 ENCOUNTER — Other Ambulatory Visit: Payer: Self-pay | Admitting: Cardiovascular Disease

## 2019-01-09 ENCOUNTER — Ambulatory Visit (INDEPENDENT_AMBULATORY_CARE_PROVIDER_SITE_OTHER): Payer: Medicare Other | Admitting: Pharmacist

## 2019-01-09 DIAGNOSIS — I482 Chronic atrial fibrillation, unspecified: Secondary | ICD-10-CM

## 2019-01-09 DIAGNOSIS — Z7901 Long term (current) use of anticoagulants: Secondary | ICD-10-CM | POA: Diagnosis not present

## 2019-01-09 LAB — POCT INR: INR: 2.1 (ref 2.0–3.0)

## 2019-01-09 NOTE — Patient Instructions (Signed)
Description   Continue on same dosage 1 tablet daily except for 1/2 tablet on Mondays.  Recheck in 6 weeks. Call our office if you have any concerns or if you are put on any new medications [phone]

## 2019-01-19 ENCOUNTER — Ambulatory Visit (INDEPENDENT_AMBULATORY_CARE_PROVIDER_SITE_OTHER): Payer: Medicare Other | Admitting: Family Medicine

## 2019-01-19 ENCOUNTER — Encounter: Payer: Self-pay | Admitting: Family Medicine

## 2019-01-19 VITALS — BP 134/84 | HR 81 | Temp 97.9°F | Wt 179.2 lb

## 2019-01-19 DIAGNOSIS — N390 Urinary tract infection, site not specified: Secondary | ICD-10-CM

## 2019-01-19 DIAGNOSIS — R300 Dysuria: Secondary | ICD-10-CM | POA: Diagnosis not present

## 2019-01-19 LAB — POCT URINALYSIS DIPSTICK
Bilirubin, UA: NEGATIVE
Blood, UA: NEGATIVE
Glucose, UA: NEGATIVE
Ketones, UA: NEGATIVE
Leukocytes, UA: NEGATIVE
Nitrite, UA: NEGATIVE
Protein, UA: NEGATIVE
Spec Grav, UA: 1.02 (ref 1.010–1.025)
Urobilinogen, UA: 0.2 E.U./dL
pH, UA: 5.5 (ref 5.0–8.0)

## 2019-01-19 MED ORDER — CIPROFLOXACIN HCL 500 MG PO TABS: 500.0000 mg | ORAL_TABLET | Freq: Two times a day (BID) | ORAL | 0 refills | Status: DC

## 2019-01-19 NOTE — Progress Notes (Signed)
   Subjective:    Patient ID: [name], female    DOB: 1929/05/17, 85 y.o.   MRN: [id number]  HPI Here for 3 days of urinary urgency and burning, and her urine ha shad a strong odor. No fever. She drinks plenty of water every day.    Review of Systems  Constitutional: Negative.   Respiratory: Negative.   Cardiovascular: Negative.   Genitourinary: Positive for dysuria, frequency and urgency. Negative for flank pain and hematuria.       Objective:   Physical Exam Constitutional:      Appearance: She is well-developed.  Cardiovascular:     Rate and Rhythm: Normal rate and regular rhythm.     Pulses: Normal pulses.     Heart sounds: Normal heart sounds.  Pulmonary:     Effort: Pulmonary effort is normal.     Breath sounds: Normal breath sounds.  Abdominal:     General: Abdomen is flat. Bowel sounds are normal. There is no distension.     Palpations: Abdomen is soft. There is no mass.     Tenderness: There is no abdominal tenderness. There is no guarding or rebound.     Hernia: No hernia is present.  Neurological:     Mental Status: She is alert.           Assessment & Plan:  UTI, treat with Cipro. Culture the sample.  Alysia Penna, MD

## 2019-01-21 LAB — URINE CULTURE
MICRO NUMBER:: 233352
SPECIMEN QUALITY:: ADEQUATE

## 2019-02-06 ENCOUNTER — Other Ambulatory Visit: Payer: Self-pay | Admitting: Cardiovascular Disease

## 2019-02-12 ENCOUNTER — Inpatient Hospital Stay (HOSPITAL_COMMUNITY): Payer: Medicare Other

## 2019-02-12 ENCOUNTER — Inpatient Hospital Stay (HOSPITAL_COMMUNITY)
Admission: EM | Admit: 2019-02-12 | Discharge: 2019-02-13 | DRG: 281 | Disposition: A | Discharge status: Home Health | Payer: Medicare Other | Attending: Cardiovascular Disease | Admitting: Cardiovascular Disease

## 2019-02-12 ENCOUNTER — Encounter (HOSPITAL_COMMUNITY): Payer: Self-pay | Admitting: Cardiovascular Disease

## 2019-02-12 ENCOUNTER — Encounter (HOSPITAL_COMMUNITY)
Admission: EM | Disposition: A | Discharge status: Home Health | Payer: Self-pay | Source: Home / Self Care | Attending: Cardiovascular Disease

## 2019-02-12 ENCOUNTER — Emergency Department (HOSPITAL_COMMUNITY): Payer: Medicare Other

## 2019-02-12 DIAGNOSIS — I499 Cardiac arrhythmia, unspecified: Secondary | ICD-10-CM | POA: Diagnosis not present

## 2019-02-12 DIAGNOSIS — M810 Age-related osteoporosis without current pathological fracture: Secondary | ICD-10-CM | POA: Diagnosis present

## 2019-02-12 DIAGNOSIS — R0789 Other chest pain: Secondary | ICD-10-CM

## 2019-02-12 DIAGNOSIS — M545 Low back pain: Secondary | ICD-10-CM | POA: Diagnosis present

## 2019-02-12 DIAGNOSIS — I214 Non-ST elevation (NSTEMI) myocardial infarction: Principal | ICD-10-CM | POA: Diagnosis present

## 2019-02-12 DIAGNOSIS — I5032 Chronic diastolic (congestive) heart failure: Secondary | ICD-10-CM | POA: Diagnosis present

## 2019-02-12 DIAGNOSIS — I340 Nonrheumatic mitral (valve) insufficiency: Secondary | ICD-10-CM | POA: Diagnosis present

## 2019-02-12 DIAGNOSIS — Z885 Allergy status to narcotic agent status: Secondary | ICD-10-CM

## 2019-02-12 DIAGNOSIS — G8929 Other chronic pain: Secondary | ICD-10-CM | POA: Diagnosis present

## 2019-02-12 DIAGNOSIS — A5216 Charcot's arthropathy (tabetic): Secondary | ICD-10-CM | POA: Diagnosis not present

## 2019-02-12 DIAGNOSIS — E785 Hyperlipidemia, unspecified: Secondary | ICD-10-CM | POA: Diagnosis not present

## 2019-02-12 DIAGNOSIS — Z66 Do not resuscitate: Secondary | ICD-10-CM | POA: Diagnosis present

## 2019-02-12 DIAGNOSIS — G629 Polyneuropathy, unspecified: Secondary | ICD-10-CM | POA: Diagnosis not present

## 2019-02-12 DIAGNOSIS — F411 Generalized anxiety disorder: Secondary | ICD-10-CM | POA: Diagnosis present

## 2019-02-12 DIAGNOSIS — I4510 Unspecified right bundle-branch block: Secondary | ICD-10-CM | POA: Diagnosis not present

## 2019-02-12 DIAGNOSIS — R079 Chest pain, unspecified: Secondary | ICD-10-CM | POA: Diagnosis present

## 2019-02-12 DIAGNOSIS — Z7901 Long term (current) use of anticoagulants: Secondary | ICD-10-CM

## 2019-02-12 DIAGNOSIS — Z8249 Family history of ischemic heart disease and other diseases of the circulatory system: Secondary | ICD-10-CM

## 2019-02-12 DIAGNOSIS — R0602 Shortness of breath: Secondary | ICD-10-CM | POA: Diagnosis not present

## 2019-02-12 DIAGNOSIS — I517 Cardiomegaly: Secondary | ICD-10-CM | POA: Diagnosis not present

## 2019-02-12 DIAGNOSIS — I4821 Permanent atrial fibrillation: Secondary | ICD-10-CM

## 2019-02-12 DIAGNOSIS — I452 Bifascicular block: Secondary | ICD-10-CM | POA: Diagnosis not present

## 2019-02-12 DIAGNOSIS — I083 Combined rheumatic disorders of mitral, aortic and tricuspid valves: Secondary | ICD-10-CM | POA: Diagnosis present

## 2019-02-12 DIAGNOSIS — I361 Nonrheumatic tricuspid (valve) insufficiency: Secondary | ICD-10-CM

## 2019-02-12 DIAGNOSIS — I739 Peripheral vascular disease, unspecified: Secondary | ICD-10-CM | POA: Diagnosis present

## 2019-02-12 DIAGNOSIS — Z79890 Hormone replacement therapy (postmenopausal): Secondary | ICD-10-CM

## 2019-02-12 DIAGNOSIS — Z823 Family history of stroke: Secondary | ICD-10-CM | POA: Diagnosis not present

## 2019-02-12 DIAGNOSIS — I10 Essential (primary) hypertension: Secondary | ICD-10-CM | POA: Diagnosis not present

## 2019-02-12 DIAGNOSIS — E039 Hypothyroidism, unspecified: Secondary | ICD-10-CM | POA: Diagnosis present

## 2019-02-12 DIAGNOSIS — I110 Hypertensive heart disease with heart failure: Secondary | ICD-10-CM | POA: Diagnosis present

## 2019-02-12 DIAGNOSIS — I480 Paroxysmal atrial fibrillation: Secondary | ICD-10-CM | POA: Diagnosis not present

## 2019-02-12 DIAGNOSIS — I4819 Other persistent atrial fibrillation: Secondary | ICD-10-CM | POA: Diagnosis not present

## 2019-02-12 DIAGNOSIS — I350 Nonrheumatic aortic (valve) stenosis: Secondary | ICD-10-CM | POA: Diagnosis not present

## 2019-02-12 DIAGNOSIS — I4891 Unspecified atrial fibrillation: Secondary | ICD-10-CM | POA: Diagnosis present

## 2019-02-12 DIAGNOSIS — Z79899 Other long term (current) drug therapy: Secondary | ICD-10-CM

## 2019-02-12 DIAGNOSIS — R52 Pain, unspecified: Secondary | ICD-10-CM | POA: Diagnosis not present

## 2019-02-12 HISTORY — PX: LEFT HEART CATH AND CORONARY ANGIOGRAPHY: CATH118249

## 2019-02-12 LAB — I-STAT TROPONIN, ED: Troponin i, poc: 0.15 ng/mL (ref 0.00–0.08)

## 2019-02-12 LAB — BASIC METABOLIC PANEL
Anion gap: 8 (ref 5–15)
BUN: 20 mg/dL (ref 8–23)
CO2: 22 mmol/L (ref 22–32)
CREATININE: 0.94 mg/dL (ref 0.44–1.00)
Calcium: 9.2 mg/dL (ref 8.9–10.3)
Chloride: 103 mmol/L (ref 98–111)
GFR calc Af Amer: >60 mL/min (ref >60)
GFR calc non Af Amer: 55 mL/min — ABNORMAL LOW (ref >60)
GLUCOSE: 124 mg/dL — AB (ref 70–99)
Potassium: 4.8 mmol/L (ref 3.5–5.1)
Sodium: 133 mmol/L — ABNORMAL LOW (ref 135–145)

## 2019-02-12 LAB — CBC WITH DIFFERENTIAL/PLATELET
Abs Immature Granulocytes: 0.03 10*3/uL (ref 0.00–0.07)
Basophils Absolute: 0.1 10*3/uL (ref 0.0–0.1)
Basophils Relative: 1 %
EOS ABS: 0.2 10*3/uL (ref 0.0–0.5)
Eosinophils Relative: 3 %
HCT: 37.3 % (ref 36.0–46.0)
Hemoglobin: 12 g/dL (ref 12.0–15.0)
Immature Granulocytes: 1 %
Lymphocytes Relative: 15 %
Lymphs Abs: 1 10*3/uL (ref 0.7–4.0)
MCH: 32 pg (ref 26.0–34.0)
MCHC: 32.2 g/dL (ref 30.0–36.0)
MCV: 99.5 fL (ref 80.0–100.0)
Monocytes Absolute: 0.4 10*3/uL (ref 0.1–1.0)
Monocytes Relative: 7 %
Neutro Abs: 4.9 10*3/uL (ref 1.7–7.7)
Neutrophils Relative %: 73 %
Platelets: 194 10*3/uL (ref 150–400)
RBC: 3.75 MIL/uL — ABNORMAL LOW (ref 3.87–5.11)
RDW: 12.8 % (ref 11.5–15.5)
WBC: 6.6 10*3/uL (ref 4.0–10.5)
nRBC: 0 % (ref 0.0–0.2)

## 2019-02-12 LAB — LIPID PANEL
Cholesterol: 104 mg/dL (ref 0–200)
HDL: 43 mg/dL (ref >40)
LDL Cholesterol: 49 mg/dL (ref 0–99)
Total CHOL/HDL Ratio: 2.4 RATIO
Triglycerides: 60 mg/dL (ref <150)
VLDL: 12 mg/dL (ref 0–40)

## 2019-02-12 LAB — CBG MONITORING, ED: Glucose-Capillary: 123 mg/dL — ABNORMAL HIGH (ref 70–99)

## 2019-02-12 LAB — PROTIME-INR
INR: 2.1 — ABNORMAL HIGH (ref 0.8–1.2)
Prothrombin Time: 23.2 seconds — ABNORMAL HIGH (ref 11.4–15.2)

## 2019-02-12 LAB — ECHOCARDIOGRAM COMPLETE
Height: 73 in
Weight: 2928 oz

## 2019-02-12 LAB — TROPONIN I
Troponin I: 0.36 ng/mL (ref <0.03)
Troponin I: 4.93 ng/mL (ref <0.03)
Troponin I: 12.99 ng/mL (ref <0.03)

## 2019-02-12 LAB — MAGNESIUM: Magnesium: 2.1 mg/dL (ref 1.7–2.4)

## 2019-02-12 LAB — APTT: aPTT: 47 seconds — ABNORMAL HIGH (ref 24–36)

## 2019-02-12 SURGERY — LEFT HEART CATH AND CORONARY ANGIOGRAPHY
Anesthesia: LOCAL

## 2019-02-12 MED ORDER — HEPARIN (PORCINE) 25000 UT/250ML-% IV SOLN
950.0000 [IU]/h | INTRAVENOUS | Status: DC
  Administered 2019-02-12: 950 [IU]/h via INTRAVENOUS
  Filled 2019-02-12: qty 250

## 2019-02-12 MED ORDER — HEPARIN SODIUM (PORCINE) 1000 UNIT/ML IJ SOLN
INTRAMUSCULAR | Status: DC | PRN
  Administered 2019-02-12: 4000 [IU] via INTRAVENOUS

## 2019-02-12 MED ORDER — IOHEXOL 350 MG/ML SOLN
INTRAVENOUS | Status: DC | PRN
  Administered 2019-02-12: 60 mL via INTRA_ARTERIAL

## 2019-02-12 MED ORDER — SODIUM CHLORIDE 0.9 % IV SOLN: INTRAVENOUS | Status: DC

## 2019-02-12 MED ORDER — MIDAZOLAM HCL 2 MG/2ML IJ SOLN
INTRAMUSCULAR | Status: AC
  Filled 2019-02-12: qty 2

## 2019-02-12 MED ORDER — VERAPAMIL HCL 2.5 MG/ML IV SOLN
INTRAVENOUS | Status: AC
  Filled 2019-02-12: qty 2

## 2019-02-12 MED ORDER — ALPRAZOLAM 0.25 MG PO TABS: 0.2500 mg | ORAL_TABLET | Freq: Every evening | ORAL | Status: DC | PRN

## 2019-02-12 MED ORDER — CARVEDILOL 6.25 MG PO TABS
6.2500 mg | ORAL_TABLET | Freq: Two times a day (BID) | ORAL | Status: DC
  Administered 2019-02-12 – 2019-02-13 (×2): 6.25 mg via ORAL
  Filled 2019-02-12 (×2): qty 1

## 2019-02-12 MED ORDER — HEPARIN BOLUS VIA INFUSION: 4000.0000 [IU] | Freq: Once | INTRAVENOUS | Status: DC

## 2019-02-12 MED ORDER — MORPHINE SULFATE (PF) 2 MG/ML IV SOLN
2.0000 mg | Freq: Once | INTRAVENOUS | Status: AC
  Administered 2019-02-12: 2 mg via INTRAVENOUS
  Filled 2019-02-12: qty 1

## 2019-02-12 MED ORDER — SODIUM CHLORIDE 0.9% FLUSH: 3.0000 mL | INTRAVENOUS | Status: DC | PRN

## 2019-02-12 MED ORDER — LEVOTHYROXINE SODIUM 100 MCG PO TABS
100.0000 ug | ORAL_TABLET | Freq: Every day | ORAL | Status: DC
  Administered 2019-02-13: 100 ug via ORAL
  Filled 2019-02-12: qty 1

## 2019-02-12 MED ORDER — ACETAMINOPHEN 325 MG PO TABS
650.0000 mg | ORAL_TABLET | ORAL | Status: DC | PRN
  Administered 2019-02-12 (×2): 650 mg via ORAL
  Filled 2019-02-12 (×2): qty 2

## 2019-02-12 MED ORDER — ONDANSETRON HCL 4 MG/2ML IJ SOLN: 4.0000 mg | Freq: Four times a day (QID) | INTRAMUSCULAR | Status: DC | PRN

## 2019-02-12 MED ORDER — MORPHINE SULFATE (PF) 4 MG/ML IV SOLN
4.0000 mg | Freq: Once | INTRAVENOUS | Status: AC
  Administered 2019-02-12: 4 mg via INTRAVENOUS
  Filled 2019-02-12: qty 1

## 2019-02-12 MED ORDER — NITROGLYCERIN 1 MG/10 ML FOR IR/CATH LAB
INTRA_ARTERIAL | Status: AC
  Filled 2019-02-12: qty 10

## 2019-02-12 MED ORDER — HEPARIN (PORCINE) IN NACL 1000-0.9 UT/500ML-% IV SOLN
INTRAVENOUS | Status: AC
  Filled 2019-02-12: qty 1000

## 2019-02-12 MED ORDER — NITROGLYCERIN 0.4 MG SL SUBL
0.4000 mg | SUBLINGUAL_TABLET | SUBLINGUAL | Status: DC | PRN
  Administered 2019-02-12 (×2): 0.4 mg via SUBLINGUAL
  Filled 2019-02-12: qty 1

## 2019-02-12 MED ORDER — SODIUM CHLORIDE 0.9% FLUSH
3.0000 mL | Freq: Two times a day (BID) | INTRAVENOUS | Status: DC
  Administered 2019-02-12 – 2019-02-13 (×2): 3 mL via INTRAVENOUS

## 2019-02-12 MED ORDER — LIDOCAINE HCL (PF) 1 % IJ SOLN
INTRAMUSCULAR | Status: AC
  Filled 2019-02-12: qty 30

## 2019-02-12 MED ORDER — ASPIRIN 81 MG PO CHEW: 81.0000 mg | CHEWABLE_TABLET | Freq: Every day | ORAL | Status: DC

## 2019-02-12 MED ORDER — HEPARIN (PORCINE) IN NACL 1000-0.9 UT/500ML-% IV SOLN
INTRAVENOUS | Status: DC | PRN
  Administered 2019-02-12 (×2): 500 mL

## 2019-02-12 MED ORDER — ASPIRIN EC 81 MG PO TBEC
81.0000 mg | DELAYED_RELEASE_TABLET | Freq: Every day | ORAL | Status: DC
  Filled 2019-02-12: qty 1

## 2019-02-12 MED ORDER — SODIUM CHLORIDE 0.9 % IV SOLN: INTRAVENOUS | Status: AC

## 2019-02-12 MED ORDER — ONDANSETRON HCL 4 MG/2ML IJ SOLN
4.0000 mg | Freq: Once | INTRAMUSCULAR | Status: AC
  Administered 2019-02-12: 4 mg via INTRAVENOUS
  Filled 2019-02-12: qty 2

## 2019-02-12 MED ORDER — KETOROLAC TROMETHAMINE 0.5 % OP SOLN
1.0000 [drp] | Freq: Two times a day (BID) | OPHTHALMIC | Status: DC
  Administered 2019-02-12 – 2019-02-13 (×2): 1 [drp] via OPHTHALMIC
  Filled 2019-02-12: qty 5

## 2019-02-12 MED ORDER — LIDOCAINE HCL (PF) 1 % IJ SOLN
INTRAMUSCULAR | Status: DC | PRN
  Administered 2019-02-12: 2 mL

## 2019-02-12 MED ORDER — HEPARIN SODIUM (PORCINE) 5000 UNIT/ML IJ SOLN
4000.0000 [IU] | Freq: Once | INTRAMUSCULAR | Status: AC
  Administered 2019-02-12: 4000 [IU] via INTRAVENOUS
  Filled 2019-02-12: qty 1

## 2019-02-12 MED ORDER — NITROGLYCERIN IN D5W 200-5 MCG/ML-% IV SOLN
0.0000 ug/min | INTRAVENOUS | Status: DC
  Administered 2019-02-12: 5 ug/min via INTRAVENOUS
  Filled 2019-02-12: qty 250

## 2019-02-12 MED ORDER — SODIUM CHLORIDE 0.9 % IV SOLN: 250.0000 mL | INTRAVENOUS | Status: DC | PRN

## 2019-02-12 MED ORDER — FENTANYL CITRATE (PF) 100 MCG/2ML IJ SOLN
INTRAMUSCULAR | Status: AC
  Filled 2019-02-12: qty 2

## 2019-02-12 MED ORDER — VERAPAMIL HCL 2.5 MG/ML IV SOLN
INTRA_ARTERIAL | Status: DC | PRN
  Administered 2019-02-12: 10 mL via INTRA_ARTERIAL

## 2019-02-12 MED ORDER — ASPIRIN 81 MG PO CHEW
324.0000 mg | CHEWABLE_TABLET | Freq: Once | ORAL | Status: AC
  Administered 2019-02-12: 324 mg via ORAL
  Filled 2019-02-12: qty 4

## 2019-02-12 MED ORDER — ATORVASTATIN CALCIUM 10 MG PO TABS
20.0000 mg | ORAL_TABLET | Freq: Every day | ORAL | Status: DC
  Administered 2019-02-12: 20 mg via ORAL
  Filled 2019-02-12: qty 2

## 2019-02-12 MED ORDER — ACETAMINOPHEN 325 MG PO TABS: 650.0000 mg | ORAL_TABLET | ORAL | Status: DC | PRN

## 2019-02-12 MED ORDER — HEPARIN SODIUM (PORCINE) 5000 UNIT/ML IJ SOLN: 60.0000 [IU]/kg | Freq: Once | INTRAMUSCULAR | Status: DC

## 2019-02-12 MED ORDER — SODIUM CHLORIDE 0.9 % IV SOLN
INTRAVENOUS | Status: DC
  Administered 2019-02-12: 07:00:00 via INTRAVENOUS

## 2019-02-12 SURGICAL SUPPLY — 12 items
CATH INFINITI 5FR ANG PIGTAIL (CATHETERS) ×1 IMPLANT
CATH OPTITORQUE TIG 4.0 5F (CATHETERS) ×1 IMPLANT
DEVICE RAD COMP TR BAND LRG (VASCULAR PRODUCTS) ×1 IMPLANT
GLIDESHEATH SLEND A-KIT 6F 22G (SHEATH) ×1 IMPLANT
GUIDEWIRE INQWIRE 1.5J.035X260 (WIRE) IMPLANT
INQWIRE 1.5J .035X260CM (WIRE) ×2
KIT HEART LEFT (KITS) ×2 IMPLANT
PACK CARDIAC CATHETERIZATION (CUSTOM PROCEDURE TRAY) ×2 IMPLANT
SYR MEDRAD MARK 7 150ML (SYRINGE) ×2 IMPLANT
TRANSDUCER W/STOPCOCK (MISCELLANEOUS) ×2 IMPLANT
TUBING CIL FLEX 10 FLL-RA (TUBING) ×2 IMPLANT
WIRE HI TORQ VERSACORE-J 145CM (WIRE) ×1 IMPLANT

## 2019-02-12 NOTE — Progress Notes (Signed)
  Echocardiogram 2D Echocardiogram has been performed.  Jennette Dubin 02/12/2019, 2:20 PM

## 2019-02-12 NOTE — H&P (Signed)
Cardiology Admission History and Physical:   Patient ID: [name] MRN: [id number]; DOB: [birth year]   Admission date: 02/12/2019  Primary Care Provider: Laurey Morale, MD Primary Cardiologist: Jenkins Rouge, MD Primary Electrophysiologist:  None   Chief Complaint:  Chest pain  Patient Profile:   [name] is a 85 y.o. female with PMH of PAF (on coumadin), moderate AS, HTN, HL, hypothyroidism who presented with chest pain. Found to have an NSTEMI.   History of Present Illness:   [name] is an 85 yo female with PMH noted above. She is followed by Dr. Johnsie Cancel as an outpatient for her Afib. Hx of colon CA in 1994. Admitted for PNA back in 2/19 and blood pressure medications were held 2/2 to elevated Cr. She was last seen in the office on 12/04/2018 and reported being in her usual state of health. Currently lives at independent living facility. Cares mostly for herself, and walks her dog on a regular basis.  This morning she was up around 4am which is not usual for her. Developed left sided chest pain with radiation down into the left arm. Did walk her dog outside and symptoms persisted. Began to feel short of breath and nauseated. Waited until around 5:30am and called EMS. No ASA given 2/2 to her coumadin. On arrival to the ED she was given SL nitro with improvement in pain to a 3/10. EKG on arrival with new RBBB with inferolateral ST depression. In the ED labs showed stable electrolytes, Hgb 12.0, Cr 0.94, POC trop 0.15, INR 2.1. Placed on IV heparin via the ED.    Past Medical History:  Diagnosis Date  . Anxiety   . Atrial fibrillation ([place])   . Cancer ([place])   . Cataract    REMOVED BILATERAL  . Chest pain, unspecified   . Chronic low back pain   . Diverticulosis of colon   . History of colon cancer   . History of colonic polyps   . History of colonoscopy   . History of uterine cancer   . Hyperlipidemia   . Hypertension   . Hypothyroid   . Impaired glucose tolerance    . Mitral valve disorder   . Osteoarthritis   . Osteoporosis   . Patent foramen ovale   . Peripheral neuropathy   . Peripheral vascular disease ([place])   . Splenic infarction   . UTI (lower urinary tract infection)   . Venous insufficiency   . Vitamin B12 deficiency     Past Surgical History:  Procedure Laterality Date  . ANTERIOR AND POSTERIOR VAGINAL REPAIR  03/2006   Dr.Neal  . basal cell skin cancer Moh's surgery    . CATARACT EXTRACTION, BILATERAL    . COLONOSCOPY  05-25-10   per Dr. Henrene Pastor, benign polyps, repeat in 5 yrs   . EYE SURGERY    . hysterectomy for uterine cancer    . left carpal tunnel surgery  02/2007   Dr. Daylene Katayama  . lumbar laminectomy for spinal stenosis  2006   L3-4  . RETINAL DETACHMENT REPAIR W/ SCLERAL BUCKLE LE Right 03-01-14   per Dr. Zadie Rhine   . right hemicolectomy for colon cancer  1994     Medications Prior to Admission: Prior to Admission medications   Medication Sig Start Date End Date Taking? Authorizing Provider  acetaminophen (TYLENOL) 500 MG tablet Take 2 tablets (1,000 mg total) by mouth 2 (two) times daily as needed. 04/30/18  Yes Laurey Morale, MD  ALPRAZolam Duanne Moron)  0.25 MG tablet TAKE 1 TABLET BY MOUTH AT BEDTIME AS NEEDED Patient taking differently: Take 0.25 mg by mouth at bedtime as needed for sleep.  09/03/18  Yes Laurey Morale, MD  atorvastatin (LIPITOR) 20 MG tablet TAKE 1 TABLET BY MOUTH  DAILY Patient taking differently: Take 20 mg by mouth daily at 6 PM.  02/09/19  Yes Josue Hector, MD  Calcium Carbonate-Vitamin D (CALTRATE 600+D) 600-400 MG-UNIT per tablet Take 1 tablet by mouth daily.     Yes [provider]  carvedilol (COREG) 6.25 MG tablet TAKE 1 TABLET BY MOUTH TWO  TIMES DAILY Patient taking differently: Take 6.25 mg by mouth 2 (two) times daily with a meal.  12/16/18  Yes Josue Hector, MD  cetirizine (ZYRTEC) 10 MG tablet Take 1 tablet (10 mg total) by mouth daily. 10/30/18  Yes Hoyt Koch, MD  furosemide  (LASIX) 40 MG tablet TAKE 1 TABLET BY MOUTH  DAILY Patient taking differently: Take 40 mg by mouth daily.  12/31/18  Yes Josue Hector, MD  ketorolac (ACULAR) 0.5 % ophthalmic solution Place 1 drop into the right eye 2 (two) times daily. 03/17/15  Yes [provider]  Multiple Vitamins-Minerals (CENTRUM SILVER PO) Take 1 tablet by mouth daily.     Yes [provider]  potassium chloride SA (K-DUR,KLOR-CON) 20 MEQ tablet TAKE 1 TABLET BY MOUTH TWO  TIMES DAILY Patient taking differently: Take 20 mEq by mouth 2 (two) times daily.  09/25/18  Yes Josue Hector, MD  quinapril (ACCUPRIL) 20 MG tablet TAKE 1 TABLET BY MOUTH AT  BEDTIME Patient taking differently: Take 20 mg by mouth at bedtime.  12/04/18  Yes Josue Hector, MD  spironolactone (ALDACTONE) 25 MG tablet TAKE 1 TABLET BY MOUTH  DAILY Patient taking differently: Take 25 mg by mouth daily.  02/09/19  Yes Josue Hector, MD  SYNTHROID 100 MCG tablet Take 1 tablet (100 mcg total) by mouth daily before breakfast. 06/13/18  Yes Laurey Morale, MD  vitamin B-12 (CYANOCOBALAMIN) 1000 MCG tablet Take 1,000 mcg by mouth daily.     Yes [provider]  warfarin (COUMADIN) 5 MG tablet TAKE 1 TO 1.5 TABLETS BY  MOUTH DAILY AS DIRECTED BY  [place] Patient taking differently: Take 2.5-5 mg by mouth See admin instructions. Take 1/2 tablet on Monday then take 1 tablet all the other days 09/18/18  Yes Josue Hector, MD  ciprofloxacin (CIPRO) 500 MG tablet Take 1 tablet (500 mg total) by mouth 2 (two) times daily. Patient not taking: Reported on 02/12/2019 01/19/19   Laurey Morale, MD     Allergies:    Allergies  Allergen Reactions  . Codeine Nausea Only    REACTION: nausea    Social History:   Social History   Socioeconomic History  . Marital status: Widowed    Spouse name: Not on file  . Number of children: 0  . Years of education: Not on file  . Highest education level: Not on file  Occupational History  .  Occupation: retired    Comment: part time at [employer]  . Financial resource strain: Not on file  . Food insecurity:    Worry: Not on file    Inability: Not on file  . Transportation needs:    Medical: Not on file    Non-medical: Not on file  Tobacco Use  . Smoking status: Never Smoker  . Smokeless tobacco: Never Used  Substance and Sexual Activity  . Alcohol use: Yes    Alcohol/week: 0.0 standard drinks    Comment: occ  . Drug use: No  . Sexual activity: Never  Lifestyle  . Physical activity:    Days per week: Not on file    Minutes per session: Not on file  . Stress: Not on file  Relationships  . Social connections:    Talks on phone: Not on file    Gets together: Not on file    Attends religious service: Not on file    Active member of club or organization: Not on file    Attends meetings of clubs or organizations: Not on file    Relationship status: Not on file  . Intimate partner violence:    Fear of current or ex partner: Not on file    Emotionally abused: Not on file    Physically abused: Not on file    Forced sexual activity: Not on file  Other Topics Concern  . Not on file  Social History Narrative  . Not on file    Family History:   The patient's family history includes Cancer in her brother and mother; Heart disease in her sister; Heart failure in her father; Stroke in her sister.    ROS:  Please see the history of present illness.  All other ROS reviewed and negative.     Physical Exam/Data:   Vitals:   02/12/19 0631 02/12/19 0632 02/12/19 0701  BP: 134/90    Pulse:  99   Resp:  (!) 21   SpO2:  95%   Weight:   78.9 kg   No intake or output data in the 24 hours ending 02/12/19 0743 Last 3 Weights 02/12/2019 01/19/2019 12/04/2018  Weight (lbs) 174 lb 179 lb 4 oz 181 lb  Weight (kg) 78.926 kg 81.307 kg 82.101 kg     Body mass index is 24.27 kg/m.  General:  Well nourished, well developed, in no acute distress HEENT: normal Neck: no JVD  Endocrine:  No thryomegaly Vascular: No carotid bruits; FA pulses 2+ bilaterally without bruits  Cardiac:  normal S1, S2; RRR; harsh 2/6 systolic murmur  Lungs:  clear to auscultation bilaterally, no wheezing, rhonchi or rales  Abd: soft, nontender, no hepatomegaly  Ext: no edema Musculoskeletal:  No deformities, BUE and BLE strength normal and equal Skin: warm and dry  Neuro:  CNs 2-12 intact, no focal abnormalities noted Psych:  Normal affect    EKG:  The ECG that was done 02/12/2019 was personally reviewed and demonstrates SR with new RBBB with ST depression in inferolateral leads  Relevant CV Studies:  TTE: 12/12/17  Study Conclusions  - Left ventricle: The cavity size was normal. Wall thickness was   normal. Systolic function was normal. The estimated ejection   fraction was in the range of 60% to 65%. - Ventricular septum: The contour showed diastolic flattening. - Aortic valve: There was trivial regurgitation. - Mitral valve: The findings are consistent with moderate stenosis.   There was mild to moderate regurgitation. Valve area by pressure   half-time: 1.03 cm^2. Valve area by continuity equation (using   LVOT flow): 0.86 cm^2. - Left atrium: The atrium was severely dilated. - Right ventricle: The cavity size was moderately dilated. Systolic   function was mildly to moderately reduced. - Right atrium: The atrium was moderately dilated. - Tricuspid valve: There was mild-moderate regurgitation. - Pulmonary arteries: Systolic pressure was moderately increased.   PA peak pressure:  44 mm Hg (S).  Laboratory Data:  Chemistry Recent Labs  Lab 02/12/19 0642  NA 133*  K 4.8  CL 103  CO2 22  GLUCOSE 124*  BUN 20  CREATININE 0.94  CALCIUM 9.2  GFRNONAA 55*  GFRAA >60  ANIONGAP 8    No results for input(s): PROT, ALBUMIN, AST, ALT, ALKPHOS, BILITOT in the last 168 hours. Hematology Recent Labs  Lab 02/12/19 0642  WBC 6.6  RBC 3.75*  HGB 12.0  HCT 37.3   MCV 99.5  MCH 32.0  MCHC 32.2  RDW 12.8  PLT 194   Cardiac EnzymesNo results for input(s): TROPONINI in the last 168 hours.  Recent Labs  Lab 02/12/19 0637  TROPIPOC 0.15*    BNPNo results for input(s): BNP, PROBNP in the last 168 hours.  DDimer No results for input(s): DDIMER in the last 168 hours.  Radiology/Studies:  Dg Chest Port 1 View  Result Date: 02/12/2019 CLINICAL DATA:  Chest discomfort. EXAM: PORTABLE CHEST 1 VIEW COMPARISON:  01/17/2018.  01/16/2018. FINDINGS: Mediastinum hilar structures normal. Cardiomegaly again noted. Mild bilateral interstitial prominence with Kerley B-lines are again noted. Findings suggest mild congestive heart failure with interstitial edema. Pneumonitis can not be excluded. Similar findings noted on prior exam. No prominent pleural effusion. No pneumothorax. IMPRESSION: Cardiomegaly with mild bilateral interstitial prominence again. Findings most consistent CHF. Pneumonitis can not be excluded. No significant change from prior exam. Electronically Signed   By: Marcello Moores  Register   On: 02/12/2019 07:15    Assessment and Plan:   [name] is a 85 y.o. female with PMH of PAF (on coumadin), moderate AS, HTN, HL, hypothyroidism who presented with chest pain. Found to have an NSTEMI.   1. NSTEMI: Developed onset of chest pain this morning around 4am. Left sided chest pain with radiation into the left arm, associated with nausea and shortness of breath. Symptoms are very concerning for ACS. Is on Coumadin, INR 2.1 in the ED. Given IV heparin via EDP. Will need to undergo cath though need to discuss with MD regarding INR>2. Discussed cath with patient.  -- start IV nitro given pain still 3/10  2. PAF: currently in SR. Hold coumadin with need for cath  3. Moderate AS: noted on last echo in 1/19. On exam sounds like this may have progressed.  -- repeat echo  4. HTN: stable, will continue BB. Hold ACEi  Severity of Illness: The appropriate patient  status for this patient is INPATIENT. Inpatient status is judged to be reasonable and necessary in order to provide the required intensity of service to ensure the patient's safety. The patient's presenting symptoms, physical exam findings, and initial radiographic and laboratory data in the context of their chronic comorbidities is felt to place them at high risk for further clinical deterioration. Furthermore, it is not anticipated that the patient will be medically stable for discharge from the hospital within 2 midnights of admission. The following factors support the patient status of inpatient.   " The patient's presenting symptoms include chest pain, shortness of breath, nausea. " The worrisome physical exam findings include systolic murmur. " The initial radiographic and laboratory data are worrisome because of Trop 0.15, EKG with RBBB. " The chronic co-morbidities include HTN, HL.   * I certify that at the point of admission it is my clinical judgment that the patient will require inpatient hospital care spanning beyond 2 midnights from the point of admission due to high intensity of service, high risk for  further deterioration and high frequency of surveillance required.*    For questions or updates, please contact [place] Please consult [email or web address] for contact info under    Signed, Reino Bellis, NP  02/12/2019 7:43 AM

## 2019-02-12 NOTE — ED Triage Notes (Addendum)
Pt coming by EMS after waking up this morning at 0400 with chest discomfort. Went outside to walk down and after coming back in she had developed chest pain on the left side down her left arm. On Coumadin, so does not take Aspirin. Alert and oriented. Only receieved 4 mg of IV Zofran

## 2019-02-12 NOTE — ED Notes (Signed)
Cardiology at bedside.

## 2019-02-12 NOTE — Plan of Care (Signed)
  Problem: Education: Goal: Understanding of CV disease, CV risk reduction, and recovery process will improve Outcome: Progressing   Problem: Activity: Goal: Ability to return to baseline activity level will improve Outcome: Progressing   Problem: Cardiovascular: Goal: Ability to achieve and maintain adequate cardiovascular perfusion will improve Outcome: Progressing   

## 2019-02-12 NOTE — Progress Notes (Signed)
ANTICOAGULATION CONSULT NOTE - Initial Consult  Pharmacy Consult for Hepatin Indication: chest pain/ACS  Allergies  Allergen Reactions  . Codeine Nausea Only    REACTION: nausea    Patient Measurements: Weight: 174 lb (78.9 kg)  Vital Signs: BP: 134/90 (03/19 0631) Pulse Rate: 99 (03/19 0632)  Labs: Recent Labs    02/12/19 0642  HGB 12.0  HCT 37.3  PLT 194    CrCl cannot be calculated (Patient's most recent lab result is older than the maximum 21 days allowed.).   Medical History: Past Medical History:  Diagnosis Date  . Anxiety   . Atrial fibrillation ([place])   . Cancer ([place])   . Cataract    REMOVED BILATERAL  . Chest pain, unspecified   . Chronic low back pain   . Diverticulosis of colon   . History of colon cancer   . History of colonic polyps   . History of colonoscopy   . History of uterine cancer   . Hyperlipidemia   . Hypertension   . Hypothyroid   . Impaired glucose tolerance   . Mitral valve disorder   . Osteoarthritis   . Osteoporosis   . Patent foramen ovale   . Peripheral neuropathy   . Peripheral vascular disease ([place])   . Splenic infarction   . UTI (lower urinary tract infection)   . Venous insufficiency   . Vitamin B12 deficiency     Assessment:  85 year old female with history of hypertension, hyperlipidemia, A. fib on Coumadin followed by Dr. Johnsie Cancel with cardiology who presents to the emergency department with an episode of left-sided sharp and is tight chest pain without radiation with shortness of breath, nausea, dizziness and diaphoresis.  Pharmacy consulted to start Heparin for chest pain.  Goal of Therapy:  Heparin level 0.3-0.7 units/ml Monitor platelets by anticoagulation protocol: Yes   Plan:  Give 4000 units bolus x 1 Start heparin infusion at 950 units/hr Check anti-Xa level in 8 hours and daily while on heparin Continue to monitor H&H and platelets  Alanda Slim, PharmD, [facility] Clinical Pharmacist Please see AMION for  all Pharmacists' Contact Phone Numbers 02/12/2019, 7:17 AM

## 2019-02-12 NOTE — ED Notes (Signed)
Bleeding noted to IV site. IV flushes and works well. She denies pain. Cleaned and redressed the site.

## 2019-02-12 NOTE — Interval H&P Note (Signed)
Cath Lab Visit (complete for each Cath Lab visit)  Clinical Evaluation Leading to the Procedure:   ACS: Yes.    Non-ACS:    Anginal Classification: CCS III  Anti-ischemic medical therapy: No Therapy  Non-Invasive Test Results: No non-invasive testing performed  Prior CABG: No previous CABG      History and Physical Interval Note:  02/12/2019 10:11 AM  [name]  has presented today for surgery, with the diagnosis of non stemi.  The various methods of treatment have been discussed with the patient and family. After consideration of risks, benefits and other options for treatment, the patient has consented to  Procedure(s): LEFT HEART CATH AND CORONARY ANGIOGRAPHY (N/A) as a surgical intervention.  The patient's history has been reviewed, patient examined, no change in status, stable for surgery.  I have reviewed the patient's chart and labs.  Questions were answered to the patient's satisfaction.     Quay Burow

## 2019-02-12 NOTE — Progress Notes (Signed)
Troponin = 12.99. Rosaria Ferries, PA notified.

## 2019-02-12 NOTE — ED Notes (Signed)
Called Code Stemi

## 2019-02-12 NOTE — ED Provider Notes (Signed)
Medical screening examination/treatment/procedure(s) were conducted as a shared visit with non-physician practitioner(s) and myself.  I personally evaluated the patient during the encounter.  EKG Interpretation  Date/Time:  Thursday February 12 2019 06:49:55 EDT Ventricular Rate:  106 PR Interval:    QRS Duration: 176 QT Interval:  423 QTC Calculation: 562 R Axis:   119 Text Interpretation:  Junctional tachycardia RBBB and LPFB Confirmed by Pryor Curia [phone]) on 02/12/2019 6:59:03 AM   Patient is an 85 year old female with history of hypertension, hyperlipidemia, A. fib on Coumadin followed by Dr. Johnsie Cancel with cardiology who presents to the emergency department with an episode of left-sided sharp and is tight chest pain without radiation with shortness of breath, nausea, dizziness and diaphoresis.  Symptoms started around 430 this morning after walking her dog.  Slowly improving with nitroglycerin.  Initial EKGs with EMS showed A. fib with a narrow complex QRS and it appears she has had dynamic EKG changes with what appears to be a left bundle branch block, junctional rhythm versus A. fib.  Code STEMI initially called given concerns for ACS.  EKG is reviewed by Dr. Julianne Handler with cardiology.  Appreciate his help.  States patient does not meet STEMI criteria but agrees concerning for ACS.  Patient would be very high risk for cath given age, comorbidities and anticoagulation.  They will see patient in the ED but will not take patient emergently to the Cath Lab at this time.  Patient is a DNR, DNI.  Confirmed at bedside with multiple providers present.    CRITICAL CARE Performed by: Pryor Curia   Total critical care time: 45 minutes  Critical care time was exclusive of separately billable procedures and treating other patients.  Critical care was necessary to treat or prevent imminent or life-threatening deterioration.  Critical care was time spent personally by me on the following  activities: development of treatment plan with patient and/or surrogate as well as nursing, discussions with consultants, evaluation of patient's response to treatment, examination of patient, obtaining history from patient or surrogate, ordering and performing treatments and interventions, ordering and review of laboratory studies, ordering and review of radiographic studies, pulse oximetry and re-evaluation of patient's condition.    Michaiah Holsopple, Delice Bison, DO 02/12/19 [phone]

## 2019-02-12 NOTE — ED Provider Notes (Addendum)
[facility] EMERGENCY DEPARTMENT Provider Note   CSN: [id number] Arrival date & time: 02/12/19  0630    History   Chief Complaint Chief Complaint  Patient presents with  . Chest Pain    HPI [name] is a 85 y.o. female.     The history is provided by the patient and medical records. No language interpreter was used.  Chest Pain     85 year old female with history of atrial fibrillation currently on Coumadin, history of hypertension, hyperlipidemia, valvular disease, uterine cancer brought here via EMS from home for evaluation of chest pain.  Patient states she normally gets up at 4 AM to walk her dogs.  She did endorse some mild chest discomfort, did walk her dog about the time to get back she endorsed moderate amount of pain in her chest radiates to her back.  States that she did break out in a sweat, felt nauseous and became short of breath.  When EMS arrived, patient received Zofran which did help with her symptoms, she rates pain at 5 out of 10 still endorsing shortness of breath.  Past Medical History:  Diagnosis Date  . Anxiety   . Atrial fibrillation ([place])   . Cancer ([employer])   . Cataract    REMOVED BILATERAL  . Chest pain, unspecified   . Chronic low back pain   . Diverticulosis of colon   . History of colon cancer   . History of colonic polyps   . History of colonoscopy   . History of uterine cancer   . Hyperlipidemia   . Hypertension   . Hypothyroid   . Impaired glucose tolerance   . Mitral valve disorder   . Osteoarthritis   . Osteoporosis   . Patent foramen ovale   . Peripheral neuropathy   . Peripheral vascular disease ([place])   . Splenic infarction   . UTI (lower urinary tract infection)   . Venous insufficiency   . Vitamin B12 deficiency     Patient Active Problem List   Diagnosis Date Noted  . Sore throat 10/30/2018  . Fatigue 10/30/2018  . Dysuria 10/30/2018  . Hypoxemia 01/16/2018  . Lower respiratory infection  01/16/2018  . History of colon cancer in adulthood 01/16/2018  . Diastolic congestive heart failure with preserved left ventricular function, NYHA class 2 ([place]) 01/16/2018  . SBO (small bowel obstruction) ([place]) 04/02/2017  . CAP (community acquired pneumonia) 02/24/2016  . Fever blister 02/24/2016  . Bilateral pneumonia 02/17/2016  . Acute respiratory failure with hypercapnia ([place]) 02/17/2016  . Acute diastolic heart failure ([place])   . Dehydration 12/15/2015  . Sepsis ([place]) 12/15/2015  . Enteritis 12/15/2015  . Diarrhea   . Charcot's joint of left foot 11/24/2015  . Vasculitis ([place]) 03/11/2015  . Aortic stenosis 09/14/2013  . Long term current use of anticoagulant 01/09/2011  . COLONIC POLYPS 10/29/2010  . DIVERTICULOSIS OF COLON 10/29/2010  . ABNORMAL CV (STRESS) TEST 04/20/2010  . CHEST PAIN UNSPECIFIED 04/17/2010  . VITAMIN B12 DEFICIENCY 12/23/2008  . IMPAIRED GLUCOSE TOLERANCE 12/23/2008  . PERIPHERAL NEUROPATHY 06/22/2008  . SPLENIC INFARCTION 02/20/2008  . Anxiety state 02/20/2008  . Moderate mitral regurgitation 02/20/2008  . PERIPHERAL VASCULAR DISEASE 02/20/2008  . Venous (peripheral) insufficiency 02/20/2008  . URINARY TRACT INFECTION 02/20/2008  . Osteoarthritis 02/20/2008  . OSTEOPOROSIS 02/20/2008  . UTERINE CANCER, HX OF 02/20/2008  . Malignant neoplasm of colon ([place]) 01/13/2008  . Hypothyroidism 01/13/2008  . Hyperlipemia 01/13/2008  . Essential hypertension 01/13/2008  .  ATRIAL FIBRILLATION 01/13/2008  . LOW BACK PAIN, CHRONIC 01/13/2008    Past Surgical History:  Procedure Laterality Date  . ANTERIOR AND POSTERIOR VAGINAL REPAIR  03/2006   Dr.Neal  . basal cell skin cancer Moh's surgery    . CATARACT EXTRACTION, BILATERAL    . COLONOSCOPY  05-25-10   per Dr. Henrene Pastor, benign polyps, repeat in 5 yrs   . EYE SURGERY    . hysterectomy for uterine cancer    . left carpal tunnel surgery  02/2007   Dr. Daylene Katayama  . lumbar laminectomy for spinal stenosis  2006    L3-4  . RETINAL DETACHMENT REPAIR W/ SCLERAL BUCKLE LE Right 03-01-14   per Dr. Zadie Rhine   . right hemicolectomy for colon cancer  1994     OB History   No obstetric history on file.      Home Medications    Prior to Admission medications   Medication Sig Start Date End Date Taking? Authorizing Provider  acetaminophen (TYLENOL) 500 MG tablet Take 2 tablets (1,000 mg total) by mouth 2 (two) times daily as needed. 04/30/18   Laurey Morale, MD  ALPRAZolam Duanne Moron) 0.25 MG tablet TAKE 1 TABLET BY MOUTH AT BEDTIME AS NEEDED 09/03/18   Laurey Morale, MD  atorvastatin (LIPITOR) 20 MG tablet TAKE 1 TABLET BY MOUTH  DAILY 02/09/19   Josue Hector, MD  Calcium Carbonate-Vitamin D (CALTRATE 600+D) 600-400 MG-UNIT per tablet Take 1 tablet by mouth daily.      [provider]  carvedilol (COREG) 6.25 MG tablet TAKE 1 TABLET BY MOUTH TWO  TIMES DAILY 12/16/18   Josue Hector, MD  cetirizine (ZYRTEC) 10 MG tablet Take 1 tablet (10 mg total) by mouth daily. 10/30/18   Hoyt Koch, MD  ciprofloxacin (CIPRO) 500 MG tablet Take 1 tablet (500 mg total) by mouth 2 (two) times daily. 01/19/19   Laurey Morale, MD  CLIMARA 0.1 MG/24HR patch See admin instructions. 12/22/18   [provider]  furosemide (LASIX) 40 MG tablet TAKE 1 TABLET BY MOUTH  DAILY 12/31/18   Josue Hector, MD  ketorolac (ACULAR) 0.5 % ophthalmic solution Place 1 drop into the right eye 2 (two) times daily. 03/17/15   [provider]  Multiple Vitamins-Minerals (CENTRUM SILVER PO) Take 1 tablet by mouth daily.      [provider]  potassium chloride SA (K-DUR,KLOR-CON) 20 MEQ tablet TAKE 1 TABLET BY MOUTH TWO  TIMES DAILY 09/25/18   Josue Hector, MD  quinapril (ACCUPRIL) 20 MG tablet TAKE 1 TABLET BY MOUTH AT  BEDTIME 12/04/18   Josue Hector, MD  spironolactone (ALDACTONE) 25 MG tablet TAKE 1 TABLET BY MOUTH  DAILY 02/09/19   Josue Hector, MD  SYNTHROID 100 MCG tablet Take 1 tablet (100 mcg  total) by mouth daily before breakfast. 06/13/18   Laurey Morale, MD  vitamin B-12 (CYANOCOBALAMIN) 1000 MCG tablet Take 1,000 mcg by mouth daily.      [provider]  warfarin (COUMADIN) 5 MG tablet TAKE 1 TO 1.5 TABLETS BY  MOUTH DAILY AS DIRECTED BY  COUMDAIN CLINIC 09/18/18   Josue Hector, MD    Family History Family History  Problem Relation Age of Onset  . Cancer Mother        deceased age 81  . Heart failure Father        deceased age 39  . Cancer Brother  deceased age 57  . Stroke Sister        and heart problems; deceased age 92  . Heart disease Sister     Social History Social History   Tobacco Use  . Smoking status: Never Smoker  . Smokeless tobacco: Never Used  Substance Use Topics  . Alcohol use: Yes    Alcohol/week: 0.0 standard drinks    Comment: occ  . Drug use: No     Allergies   Codeine   Review of Systems Review of Systems  Cardiovascular: Positive for chest pain.  All other systems reviewed and are negative.    Physical Exam Updated Vital Signs BP 134/90   Pulse 99   Resp (!) 21   Wt 78.9 kg   SpO2 95%   BMI 24.27 kg/m   Physical Exam Vitals signs and nursing note reviewed.  Constitutional:      Appearance: She is well-developed. She is ill-appearing.  HENT:     Head: Atraumatic.  Eyes:     Conjunctiva/sclera: Conjunctivae normal.  Neck:     Musculoskeletal: Neck supple.  Cardiovascular:     Rate and Rhythm: Rhythm irregular.     Heart sounds: Murmur present.  Pulmonary:     Effort: Pulmonary effort is normal. No accessory muscle usage or respiratory distress.     Breath sounds: No decreased breath sounds, wheezing, rhonchi or rales.  Chest:     Chest wall: No tenderness.  Abdominal:     General: There is no abdominal bruit.     Palpations: Abdomen is soft.     Tenderness: There is no abdominal tenderness.  Musculoskeletal:     Right lower leg: No edema.     Left lower leg: No edema.  Skin:     Capillary Refill: Capillary refill takes less than 2 seconds.     Findings: No rash.  Neurological:     Mental Status: She is alert and oriented to person, place, and time.  Psychiatric:        Mood and Affect: Mood normal.      ED Treatments / Results  Labs (all labs ordered are listed, but only abnormal results are displayed) Labs Reviewed  CBC WITH DIFFERENTIAL/PLATELET - Abnormal; Notable for the following components:      Result Value   RBC 3.75 (*)    All other components within normal limits  CBG MONITORING, ED - Abnormal; Notable for the following components:   Glucose-Capillary 123 (*)    All other components within normal limits  I-STAT TROPONIN, ED - Abnormal; Notable for the following components:   Troponin i, poc 0.15 (*)    All other components within normal limits  BASIC METABOLIC PANEL  MAGNESIUM  PROTIME-INR  APTT  TROPONIN I  LIPID PANEL  HEPARIN LEVEL (UNFRACTIONATED)    EKG EKG Interpretation  Date/Time:  Thursday February 12 2019 06:49:55 EDT Ventricular Rate:  106 PR Interval:    QRS Duration: 176 QT Interval:  423 QTC Calculation: 562 R Axis:   119 Text Interpretation:  Junctional tachycardia RBBB and LPFB Confirmed by Pryor Curia [id number]) on 02/12/2019 6:59:03 AM   Radiology Dg Chest Port 1 View  Result Date: 02/12/2019 CLINICAL DATA:  Chest discomfort. EXAM: PORTABLE CHEST 1 VIEW COMPARISON:  01/17/2018.  01/16/2018. FINDINGS: Mediastinum hilar structures normal. Cardiomegaly again noted. Mild bilateral interstitial prominence with Kerley B-lines are again noted. Findings suggest mild congestive heart failure with interstitial edema. Pneumonitis can not be excluded. Similar findings noted on  prior exam. No prominent pleural effusion. No pneumothorax. IMPRESSION: Cardiomegaly with mild bilateral interstitial prominence again. Findings most consistent CHF. Pneumonitis can not be excluded. No significant change from prior exam. Electronically Signed    By: Marcello Moores  Register   On: 02/12/2019 07:15    Procedures .Critical Care Performed by: Domenic Moras, PA-C Authorized by: Domenic Moras, PA-C   Critical care provider statement:    Critical care time (minutes):  45   Critical care was time spent personally by me on the following activities:  Discussions with consultants, evaluation of patient's response to treatment, examination of patient, ordering and performing treatments and interventions, ordering and review of laboratory studies, ordering and review of radiographic studies, pulse oximetry, re-evaluation of patient's condition, obtaining history from patient or surrogate and review of old charts   (including critical care time)  Medications Ordered in ED Medications  nitroGLYCERIN (NITROSTAT) SL tablet 0.4 mg (0.4 mg Sublingual Given 02/12/19 0653)  0.9 %  sodium chloride infusion (has no administration in time range)  heparin injection 4,000 Units (has no administration in time range)  morphine 4 MG/ML injection 4 mg (4 mg Intravenous Given 02/12/19 0702)  ondansetron (ZOFRAN) injection 4 mg (4 mg Intravenous Given 02/12/19 0702)  aspirin chewable tablet 324 mg (324 mg Oral Given 02/12/19 0701)     Initial Impression / Assessment and Plan / ED Course  I have reviewed the triage vital signs and the nursing notes.  Pertinent labs & imaging results that were available during my care of the patient were reviewed by me and considered in my medical decision making (see chart for details).        BP 134/90   Pulse 99   Resp (!) 21   Wt 78.9 kg   SpO2 95%   BMI 24.27 kg/m    Final Clinical Impressions(s) / ED Diagnoses   Final diagnoses:  NSTEMI (non-ST elevated myocardial infarction) [facility])    ED Discharge Orders    None     7:08 AM Pt presents with L sided chest pain radiates to L shoulder blade that started this AM when walking her dog.  EKG shows dynamic ECG changes from afib with narrow complex to a junctional rhythm  with new RBBB.  Pt still symptomatic, STEMI doc, Dr. Angelena Form was consulted and felt pt is too high risk for cath at this time.  He agrees to see pt in the ER.  Heparin initiated.  Pt did confirm she is DNR/DNI at bedside.  Will continue with treatment which includes heparin, morphine, zofran, and SL nitro.  Care promptly discussed with DR. Ward and Dr. Johnney Killian.    7:13 AM Initial troponin 0.15. Pt currently hemodynamically stable. Will monitor closely.  Heparin per pharmacy to dose. Cardiology to admit.    Domenic Moras, PA-C 02/12/19 0725    Domenic Moras, PA-C 02/12/19 0729    Ward, Delice Bison, DO 02/12/19 1036

## 2019-02-12 NOTE — TOC Initial Note (Signed)
Transition of Care [facility]) - Initial/Assessment Note    Patient Details  Name: [name] MRN: [id number] Date of Birth: 1929-02-26  Transition of Care [facility]) CM/SW Contact:    Fuller Mandril, RN Phone Number: 02/12/2019, 8:54 AM  Clinical Narrative:                 85 y.o. female with PMH of PAF (on coumadin), moderate AS, HTN, HL, hypothyroidism who presented with chest pain. Found to have an NSTEMI.   Expected Discharge Plan: [place] Barriers to Discharge: No Barriers Identified   Patient Goals and CMS Choice Patient states their goals for this hospitalization and ongoing recovery are:: return home to dog      Expected Discharge Plan and Services Expected Discharge Plan: [place]       Expected Discharge Date: (unknown)                        Prior Living Arrangements/Services  From [place]   Patient language and need for interpreter reviewed:: Yes Do you feel safe going back to the place where you live?: Yes      Need for Family Participation in Patient Care: Yes (Comment) Care giver support system in place?: Yes (comment)   Criminal Activity/Legal Involvement Pertinent to Current Situation/Hospitalization: No - Comment as needed  Activities of Daily Living      Permission Sought/Granted                  Emotional Assessment Appearance:: Appears stated age Attitude/Demeanor/Rapport: Engaged Affect (typically observed): Accepting, Calm Orientation: : Oriented to Self, Oriented to Place, Oriented to  Time, Oriented to Situation Alcohol / Substance Use: Never Used Psych Involvement: No (comment)  Admission diagnosis:  chest pain  Patient Active Problem List   Diagnosis Date Noted  . NSTEMI (non-ST elevated myocardial infarction) ([place]) 02/12/2019  . Sore throat 10/30/2018  . Fatigue 10/30/2018  . Dysuria 10/30/2018  . Hypoxemia 01/16/2018  . Lower respiratory infection 01/16/2018  . History of  colon cancer in adulthood 01/16/2018  . Diastolic congestive heart failure with preserved left ventricular function, NYHA class 2 ([place]) 01/16/2018  . SBO (small bowel obstruction) ([place]) 04/02/2017  . CAP (community acquired pneumonia) 02/24/2016  . Fever blister 02/24/2016  . Bilateral pneumonia 02/17/2016  . Acute respiratory failure with hypercapnia ([place]) 02/17/2016  . Acute diastolic heart failure ([place])   . Dehydration 12/15/2015  . Sepsis ([place]) 12/15/2015  . Enteritis 12/15/2015  . Diarrhea   . Charcot's joint of left foot 11/24/2015  . Vasculitis ([place]) 03/11/2015  . Aortic stenosis 09/14/2013  . Long term current use of anticoagulant 01/09/2011  . COLONIC POLYPS 10/29/2010  . DIVERTICULOSIS OF COLON 10/29/2010  . ABNORMAL CV (STRESS) TEST 04/20/2010  . CHEST PAIN UNSPECIFIED 04/17/2010  . VITAMIN B12 DEFICIENCY 12/23/2008  . IMPAIRED GLUCOSE TOLERANCE 12/23/2008  . PERIPHERAL NEUROPATHY 06/22/2008  . SPLENIC INFARCTION 02/20/2008  . Anxiety state 02/20/2008  . Moderate mitral regurgitation 02/20/2008  . PERIPHERAL VASCULAR DISEASE 02/20/2008  . Venous (peripheral) insufficiency 02/20/2008  . URINARY TRACT INFECTION 02/20/2008  . Osteoarthritis 02/20/2008  . OSTEOPOROSIS 02/20/2008  . UTERINE CANCER, HX OF 02/20/2008  . Malignant neoplasm of colon ([place]) 01/13/2008  . Hypothyroidism 01/13/2008  . Hyperlipemia 01/13/2008  . Essential hypertension 01/13/2008  . ATRIAL FIBRILLATION 01/13/2008  . LOW BACK PAIN, CHRONIC 01/13/2008   PCP:  Laurey Morale, MD Pharmacy:  [id number] [name], [place] [address] Phone: [phone] Fax: [phone]  [place], [facility] [address] [place] [address] [place] [address] Phone: [phone] Fax: [phone]     Social Determinants of Health ([place]) Interventions    Readmission Risk Interventions  No flowsheet  data found.

## 2019-02-12 NOTE — Progress Notes (Signed)
Critical Result - Troponin = 4.93. Bhagat, PA notified via text messaging.

## 2019-02-13 ENCOUNTER — Other Ambulatory Visit: Payer: Self-pay | Admitting: Cardiology

## 2019-02-13 DIAGNOSIS — N289 Disorder of kidney and ureter, unspecified: Secondary | ICD-10-CM

## 2019-02-13 DIAGNOSIS — I4819 Other persistent atrial fibrillation: Secondary | ICD-10-CM

## 2019-02-13 LAB — BASIC METABOLIC PANEL
ANION GAP: 3 — AB (ref 5–15)
BUN: 13 mg/dL (ref 8–23)
CO2: 25 mmol/L (ref 22–32)
Calcium: 9.1 mg/dL (ref 8.9–10.3)
Chloride: 106 mmol/L (ref 98–111)
Creatinine, Ser: 0.78 mg/dL (ref 0.44–1.00)
GFR calc Af Amer: >60 mL/min (ref >60)
GFR calc non Af Amer: >60 mL/min (ref >60)
Glucose, Bld: 99 mg/dL (ref 70–99)
Potassium: 4.2 mmol/L (ref 3.5–5.1)
SODIUM: 134 mmol/L — AB (ref 135–145)

## 2019-02-13 LAB — CBC
HCT: 35.1 % — ABNORMAL LOW (ref 36.0–46.0)
Hemoglobin: 11.5 g/dL — ABNORMAL LOW (ref 12.0–15.0)
MCH: 31.9 pg (ref 26.0–34.0)
MCHC: 32.8 g/dL (ref 30.0–36.0)
MCV: 97.5 fL (ref 80.0–100.0)
Platelets: 172 10*3/uL (ref 150–400)
RBC: 3.6 MIL/uL — ABNORMAL LOW (ref 3.87–5.11)
RDW: 12.8 % (ref 11.5–15.5)
WBC: 6.3 10*3/uL (ref 4.0–10.5)
nRBC: 0 % (ref 0.0–0.2)

## 2019-02-13 LAB — PROTIME-INR
INR: 1.9 — ABNORMAL HIGH (ref 0.8–1.2)
Prothrombin Time: 21.7 seconds — ABNORMAL HIGH (ref 11.4–15.2)

## 2019-02-13 LAB — LIPID PANEL
Cholesterol: 100 mg/dL (ref 0–200)
HDL: 37 mg/dL — AB (ref >40)
LDL Cholesterol: 48 mg/dL (ref 0–99)
Total CHOL/HDL Ratio: 2.7 RATIO
Triglycerides: 73 mg/dL (ref <150)
VLDL: 15 mg/dL (ref 0–40)

## 2019-02-13 LAB — TROPONIN I: Troponin I: 8.42 ng/mL (ref <0.03)

## 2019-02-13 MED ORDER — WARFARIN SODIUM 7.5 MG PO TABS
7.5000 mg | ORAL_TABLET | Freq: Once | ORAL | Status: AC
  Administered 2019-02-13: 7.5 mg via ORAL
  Filled 2019-02-13: qty 1

## 2019-02-13 MED ORDER — FUROSEMIDE 40 MG PO TABS
40.0000 mg | ORAL_TABLET | Freq: Two times a day (BID) | ORAL | Status: DC
  Administered 2019-02-13: 40 mg via ORAL
  Filled 2019-02-13: qty 1

## 2019-02-13 MED ORDER — FUROSEMIDE 40 MG PO TABS: 40.0000 mg | ORAL_TABLET | Freq: Two times a day (BID) | ORAL | 4 refills | Status: DC

## 2019-02-13 MED ORDER — NITROGLYCERIN 0.4 MG SL SUBL: 0.4000 mg | SUBLINGUAL_TABLET | SUBLINGUAL | 0 refills | Status: DC | PRN

## 2019-02-13 MED ORDER — WARFARIN - PHARMACIST DOSING INPATIENT: Freq: Every day | Status: DC

## 2019-02-13 NOTE — Discharge Instructions (Signed)
Radial Site Care ° °This sheet gives you information about how to care for yourself after your procedure. Your health care provider may also give you more specific instructions. If you have problems or questions, contact your health care provider. °What can I expect after the procedure? °After the procedure, it is common to have: °· Bruising and tenderness at the catheter insertion area. °Follow these instructions at home: °Medicines °· Take over-the-counter and prescription medicines only as told by your health care provider. °Insertion site care °· Follow instructions from your health care provider about how to take care of your insertion site. Make sure you: °? Wash your hands with soap and water before you change your bandage (dressing). If soap and water are not available, use hand sanitizer. °? Change your dressing as told by your health care provider. °? Leave stitches (sutures), skin glue, or adhesive strips in place. These skin closures may need to stay in place for 2 weeks or longer. If adhesive strip edges start to loosen and curl up, you may trim the loose edges. Do not remove adhesive strips completely unless your health care provider tells you to do that. °· Check your insertion site every day for signs of infection. Check for: °? Redness, swelling, or pain. °? Fluid or blood. °? Pus or a bad smell. °? Warmth. °· Do not take baths, swim, or use a hot tub until your health care provider approves. °· You may shower 24-48 hours after the procedure, or as directed by your health care provider. °? Remove the dressing and gently wash the site with plain soap and water. °? Pat the area dry with a clean towel. °? Do not rub the site. That could cause bleeding. °· Do not apply powder or lotion to the site. °Activity ° °· For 24 hours after the procedure, or as directed by your health care provider: °? Do not flex or bend the affected arm. °? Do not push or pull heavy objects with the affected arm. °? Do not  drive yourself home from the hospital or clinic. You may drive 24 hours after the procedure unless your health care provider tells you not to. °? Do not operate machinery or power tools. °· Do not lift anything that is heavier than 10 lb (4.5 kg), or the limit that you are told, until your health care provider says that it is safe. °· Ask your health care provider when it is okay to: °? Return to work or school. °? Resume usual physical activities or sports. °? Resume sexual activity. °General instructions °· If the catheter site starts to bleed, raise your arm and put firm pressure on the site. If the bleeding does not stop, get help right away. This is a medical emergency. °· If you went home on the same day as your procedure, a responsible adult should be with you for the first 24 hours after you arrive home. °· Keep all follow-up visits as told by your health care provider. This is important. °Contact a health care provider if: °· You have a fever. °· You have redness, swelling, or yellow drainage around your insertion site. °Get help right away if: °· You have unusual pain at the radial site. °· The catheter insertion area swells very fast. °· The insertion area is bleeding, and the bleeding does not stop when you hold steady pressure on the area. °· Your arm or hand becomes pale, cool, tingly, or numb. °These symptoms may represent a serious problem   that is an emergency. Do not wait to see if the symptoms will go away. Get medical help right away. Call your local emergency services (911 in the [place] Do not drive yourself to the hospital. Summary  After the procedure, it is common to have bruising and tenderness at the site.  Follow instructions from your health care provider about how to take care of your radial site wound. Check the wound every day for signs of infection.  Do not lift anything that is heavier than 10 lb (4.5 kg), or the limit that you are told, until your health care provider says  that it is safe. This information is not intended to replace advice given to you by your health care provider. Make sure you discuss any questions you have with your health care provider. Document Released: 12/15/2010 Document Revised: 12/18/2017 Document Reviewed: 12/18/2017 Elsevier Interactive Patient Education  2019 [place]. Acute Coronary Syndrome  Acute coronary syndrome (ACS) is a serious problem in which there is suddenly not enough blood and oxygen reaching the heart. ACS can result in chest pain or a heart attack. This condition is a medical emergency. If you have any symptoms of this condition, get help right away. What are the causes? This condition may be caused by:  Buildup of fat and cholesterol inside of the arteries (atherosclerosis). This is the most common cause. The buildup (plaque) can cause blood vessels in the heart (coronary arteries) to become narrow or blocked, which reduces blood flow to the heart. Plaque can also break off and lead to a clot, which can block an artery and cause a heart attack or stroke.  Sudden tightening of the muscles around the coronary arteries (coronary spasm).  Tearing of a coronary artery (spontaneous coronary artery dissection).  Very low blood pressure (hypotension).  An abnormal heartbeat (arrhythmia).  Other medical conditions that cause a decrease of oxygen to the heart, such as anemiaorrespiratory failure.  Using cocaine or methamphetamine. What increases the risk? The following factors may make you more likely to develop this condition:  Age. The risk for ACS increases as you get older.  History of chest pain, heart attack, peripheral artery disease, or stroke.  Having taken chemotherapy or immune-suppressing medicines.  Being female.  Family history of chest pain, heart disease, or stroke.  Smoking.  Not exercising enough.  Being overweight.  High cholesterol.  High blood pressure  (hypertension).  Diabetes.  Excessive alcohol use. What are the signs or symptoms? Common symptoms of this condition include:  Chest pain. The pain may last a long time, or it may stop and come back (recur). It may feel like: ? Crushing or squeezing. ? Tightness, pressure, fullness, or heaviness.  Arm, neck, jaw, or back pain.  Heartburn or indigestion.  Shortness of breath.  Nausea.  Sudden cold sweats.  Light-headedness.  Dizziness, or passing out.  Tiredness (fatigue). Sometimes there are no symptoms. How is this diagnosed? This condition may be diagnosed based on:  Your medical history and symptoms.  An electrocardiogram (ECG). This imaging test measures the heart's electrical activity.  Blood tests. Cardiac blood tests may need to be repeated at designated time intervals.  Chest X-ray.  A CT scan of the chest.  A coronary angiogram. This is a procedure in which dye is injected into the bloodstream and then X-rays are taken to show if there is a blockage in a coronary artery.  Exercise stress testing.  Echocardiography. This is a test that uses sound waves  to produce detailed images of the heart. How is this treated? The treatment is to restore blood flow to the heart as soon as possible. Treatment for this condition may include:  Oxygen therapy.  Medicines, such as: ? Antiplatelet medicines and blood-thinning medicines, such as aspirin. These help prevent blood clots. ? Medicine that dissolves any blood clots (fibrinolytic therapy). ? Blood pressure medicines. ? Nitroglycerin. This helps relieve chest pain and widens blood vessels to improve blood flow. ? Pain medicine. ? Cholesterol-lowering medicine.  Surgery, such as: ? Coronary angioplasty with stent placement. This involves placing a small piece of metal that looks like mesh or a spring into a narrow coronary artery. This widens the artery and keep it open. ? Coronary artery bypass surgery. This  involves taking a section of a blood vessel from a different part of your body, and placing it on the blocked coronary artery to allow blood to flow around (bypass) the blockage.  Cardiac rehabilitation. This is a program that helps improve your health and well-being. It includes exercise training, education, and counseling to help you recover. Follow these instructions at home: Eating and drinking  Eat a heart-healthy diet that includes whole grains, fruits and vegetables, lean proteins, and low-fat or nonfat dairy products.  Limit how much salt (sodium) you eat as told by your health care provider. Follow instructions from your health care provider about any other eating or drinking restrictions, such as limiting foods that are high in fat and processed sugars.  Use healthy cooking methods such as roasting, grilling, broiling, baking, poaching, steaming, or stir-frying.  Talk with a dietitian to learn about healthy cooking methods and how to eat less sodium. Medicines  Take over-the-counter and prescription medicines only as told by your health care provider.  Do not take these medicines unless your health care provider approves: ? Vitamin supplements that contain vitamin A or vitamin E. ? Nonsteroidal anti-inflammatory drugs (NSAIDs), such as ibuprofen, naproxen, or celecoxib. ? Hormone replacement therapy that contains estrogen. If you are taking blood thinners:  Talk with your health care provider before you take any medicines that contain aspirin or NSAIDs. These medicines increase your risk for dangerous bleeding.  Take your medicine exactly as told, at the same time every day.  Avoid activities that could cause injury or bruising, and follow instructions about how to prevent falls.  Wear a medical alert bracelet, and carry a card that lists what medicines you take. Activity  Join a cardiac rehabilitation program. An exercise plan will be developed for you.  Ask your health  care provider: ? What activities and exercises are safe for you. ? If you should follow specific instructions about lifting, driving, or climbing stairs. Lifestyle  Do not use any products that contain nicotine or tobacco, such as cigarettes and e-cigarettes. If you need help quitting, ask your health care provider.  If your health care provider says that alcohol is safe for you, limit your alcohol intake to no more than 1 drink a day. One drink equals 12 oz of beer, 5 oz of wine, or 1 oz of hard liquor.  Maintain a healthy weight. If you need to lose weight, work with your health care provider to do so safely. General instructions  Tell all the health care providers who care for you about your heart condition, including your dentist. This may affect the medicines or treatment you receive.  Manage any other health conditions you have, such as hypertension or diabetes. These conditions affect  your heart.  Learn ways to manage stress.  Get screened for depression, and get mental health treatment if you need it. People with ACS are at higher risk for depression.  Keep your vaccinations up to date. Get the flu shot (influenza vaccine) every year.  If directed, monitor your blood pressure at home.  Keep all follow-up visits as told by your health care provider. This is important. Contact a health care provider if:  You feel overwhelmed or sad.  You have trouble doing your daily activities. Get help right away if:  You have pain in your chest, neck, arm, jaw, stomach, or back that recurs, and: ? It lasts for more than a few minutes. ? It is not relieved by taking the [place] health care provider prescribed.  You have unexplained: ? Heavy sweating. ? Heartburn or indigestion. ? Nausea or vomiting. ? Shortness of breath. ? Difficulty breathing. ? Fatigue. ? Nervousness or anxiety. ? Weakness. ? Diarrhea. ? Dark stools or blood in your stool.  You have sudden  light-headedness or dizziness.  Your blood pressure is higher than 180/120.  You faint.  You have thoughts about hurting yourself. These symptoms may represent a serious problem that is an emergency. Do not wait to see if the symptoms will go away. Get medical help right away. Call your local emergency services (911 in the [place] Do not drive yourself to the hospital. If you ever feel like you may hurt yourself or others, or have thoughts about taking your own life, get help right away. You can go to your nearest emergency department or call:  Emergency services (911 in the [place]  A suicide crisis helpline, such as the [facility] at 956 868 4532. This is open 24 hours a day. Summary  Acute coronary syndrome (ACS) is when there is not enough blood and oxygen being supplied to the heart. ACS can result in chest pain or a heart attack.  Acute coronary syndrome is a medical emergency. If you have any symptoms of this condition, get help right away.  Treatment includes medicines and procedures to open the blocked arteries and restore blood flow. This information is not intended to replace advice given to you by your health care provider. Make sure you discuss any questions you have with your health care provider. Document Released: 11/12/2005 Document Revised: 07/23/2017 Document Reviewed: 07/23/2017 Elsevier Interactive Patient Education  2019 [employer].

## 2019-02-13 NOTE — Progress Notes (Signed)
Progress Note  Patient Name: [name] Date of Encounter: 02/13/2019  Primary Cardiologist: Jenkins Rouge, MD  Subjective   Pt feeling well today with no recurrent chest pain. Cath site without complication. Will need to restart Coumadin prior to d/c. Last INR, 2.1 which per pt report is at her baseline.   Inpatient Medications    Scheduled Meds: . aspirin EC  81 mg Oral Daily  . atorvastatin  20 mg Oral q1800  . carvedilol  6.25 mg Oral BID WC  . ketorolac  1 drop Right Eye BID  . levothyroxine  100 mcg Oral QAC breakfast  . sodium chloride flush  3 mL Intravenous Q12H   Continuous Infusions: . sodium chloride 10 mL/hr at 02/12/19 0720  . sodium chloride    . nitroGLYCERIN Stopped (02/12/19 1048)   PRN Meds: sodium chloride, acetaminophen, ALPRAZolam, nitroGLYCERIN, ondansetron (ZOFRAN) IV, sodium chloride flush   Vital Signs    Vitals:   02/12/19 1427 02/12/19 2006 02/13/19 0525 02/13/19 0751  BP:  (!) 108/54 (!) 96/54 132/77  Pulse: (!) 56 60 (!) 106 (!) 59  Resp: 17     Temp:  98.6 F (37 C) 97.7 F (36.5 C) 97.8 F (36.6 C)  TempSrc:    Oral  SpO2: 93% 94% 92% 96%  Weight:   78.4 kg   Height:        Intake/Output Summary (Last 24 hours) at 02/13/2019 0900 Last data filed at 02/12/2019 1300 Gross per 24 hour  Intake 455 ml  Output -  Net 455 ml   Filed Weights   02/12/19 0701 02/12/19 1203 02/13/19 0525  Weight: 78.9 kg 83 kg 78.4 kg    Physical Exam   General: Well developed, well nourished, NAD Skin: Warm, dry, intact  Head: Normocephalic, atraumatic,clear, moist mucus membranes. Neck: Negative for carotid bruits. No JVD Lungs:Clear to ausculation bilaterally. No wheezes, rales, or rhonchi. Breathing is unlabored. Cardiovascular: RRR with S1 S2. + murmur. Np  rubs, gallops, or LV heave appreciated. Abdomen: Soft, non-tender, non-distended with normoactive bowel sounds. No obvious abdominal masses. MSK: Strength and tone appear normal  for age. 5/5 in all extremities Extremities: No edema. No clubbing or cyanosis. DP/PT pulses 2+ bilaterally Neuro: Alert and oriented. No focal deficits. No facial asymmetry. MAE spontaneously. Psych: Responds to questions appropriately with normal affect.    Labs    Chemistry Recent Labs  Lab 02/12/19 0642 02/13/19 0309  NA 133* 134*  K 4.8 4.2  CL 103 106  CO2 22 25  GLUCOSE 124* 99  BUN 20 13  CREATININE 0.94 0.78  CALCIUM 9.2 9.1  GFRNONAA 55* >60  GFRAA >60 >60  ANIONGAP 8 3*     Hematology Recent Labs  Lab 02/12/19 0642 02/13/19 0309  WBC 6.6 6.3  RBC 3.75* 3.60*  HGB 12.0 11.5*  HCT 37.3 35.1*  MCV 99.5 97.5  MCH 32.0 31.9  MCHC 32.2 32.8  RDW 12.8 12.8  PLT 194 172    Cardiac Enzymes Recent Labs  Lab 02/12/19 0655 02/12/19 1143 02/12/19 1639 02/12/19 2315  TROPONINI 0.36* 4.93* 12.99* 8.42*    Recent Labs  Lab 02/12/19 0637  TROPIPOC 0.15*     BNPNo results for input(s): BNP, PROBNP in the last 168 hours.   DDimer No results for input(s): DDIMER in the last 168 hours.   Radiology    Dg Chest Port 1 View  Result Date: 02/12/2019 CLINICAL DATA:  Chest discomfort. EXAM: PORTABLE CHEST  1 VIEW COMPARISON:  01/17/2018.  01/16/2018. FINDINGS: Mediastinum hilar structures normal. Cardiomegaly again noted. Mild bilateral interstitial prominence with [place] B-lines are again noted. Findings suggest mild congestive heart failure with interstitial edema. Pneumonitis can not be excluded. Similar findings noted on prior exam. No prominent pleural effusion. No pneumothorax. IMPRESSION: Cardiomegaly with mild bilateral interstitial prominence again. Findings most consistent CHF. Pneumonitis can not be excluded. No significant change from prior exam. Electronically Signed   By: Marcello Moores  Register   On: 02/12/2019 07:15   Telemetry    02/13/2019 AF with rate control - Personally Reviewed  ECG    02/12/2019 atrial fibrillation with HR 59- Personally Reviewed   Cardiac Studies   TTE: 12/12/17  Study Conclusions  - Left ventricle: The cavity size was normal. Wall thickness was normal. Systolic function was normal. The estimated ejection fraction was in the range of 60% to 65%. - Ventricular septum: The contour showed diastolic flattening. - Aortic valve: There was trivial regurgitation. - Mitral valve: The findings are consistent with moderate stenosis. There was mild to moderate regurgitation. Valve area by pressure half-time: 1.03 cm^2. Valve area by continuity equation (using LVOT flow): 0.86 cm^2. - Left atrium: The atrium was severely dilated. - Right ventricle: The cavity size was moderately dilated. Systolic function was mildly to moderately reduced. - Right atrium: The atrium was moderately dilated. - Tricuspid valve: There was mild-moderate regurgitation. - Pulmonary arteries: Systolic pressure was moderately increased. PA peak pressure: 44 mm Hg (S).  Cardiac catheterization 02/12/2019:  IMPRESSION: Ms. Radich has essentially normal coronary arteries.  I did do a supravalvular aortogram to rule out dissection which was normal.  I did not cross the aortic valve.  She will need a 2D echo to rule out Takatsubo syndrome as a cause of her chest pain and mildly elevated troponins.  Her post procedure EKG showed atrial fibrillation with controlled ventricular response, nonspecific ST and T wave changes with narrow complex QRS.  She no longer has a right bundle branch block.  The sheath was removed and a TR band was placed on the right wrist to achieve patent hemostasis.  The patient left lab in stable condition.  Dr. Oval Linsey was notified of these results.  Echocardiogram 02/12/2019:  1. The left ventricle has normal systolic function with an ejection fraction of 60-65%. The cavity size was normal. There is moderately increased left ventricular wall thickness. unable to assess due to severe MAC. Elevated left atrial and left   ventricular end-diastolic pressures There is right ventricular volume and pressure overload and right ventricular volume/pressure overload.  2. The aortic valve is tricuspid Moderate thickening of the aortic valve Moderate sclerosis of the aortic valve. Aortic valve regurgitation is trivial by color flow Doppler. moderate stenosis of the aortic valve.  3. The right ventricle has mildly reduced systolic function. The cavity was moderately enlarged. There is mildly increased right ventricular wall thickness.  4. Left atrial size was massively dilated (151 ml/m2).  5. Right atrial size was moderately dilated.  6. The mitral valve is degenerative. Moderate thickening of the mitral valve leaflet. Moderate calcification of the anterior mitral valve leaflet. There is moderate to severe mitral annular calcification present. Mitral valve regurgitation is moderate  by color flow Doppler. The MR jet is centrally-directed. Moderate-severe mitral valve stenosis.  7. The tricuspid valve is not well visualized. Tricuspid valve regurgitation is moderate.  8. The aortic root and ascending aorta are normal in size and structure.  9. The inferior vena  cava was dilated in size with <50% respiratory variability. 10. The interatrial septum is aneurysmal. 11. When compared to the prior study: 12/12/2017: LVEF 60-65%, moderate MS, mild to moderate MR, severe LAE, RVSP 44 mmHg.  Patient Profile     85 y.o. female with PMH of PAF (on coumadin), moderate AS, HTN, HL, hypothyroidism who presented with chest pain. Found to have an NSTEMI.   Assessment & Plan    1. NSTEMI:  -Patient underwent cardiac cath in the setting of non-STEMI that  showed essentially normal coronary arteries.  A supravalvular aortogram to rule out dissection was performed which was also normal.  -An echocardiogram was performed which showed LVEF of 60 to 65% with normal wall motion, moderate RVE with mildly reduced RV systolic function -There was  moderate aortic stenosis with a mean gradient of 14 mmHg which appears stable when compared to prior echo from 2017 -Troponin trending down, 4.93-12.99-8.42 -Denies recurrent chest pain  -Consider adding low dose Imdur if vasospasm suspected  -ASA, carvedilol, atorvastatin  2. PAF:  -Post procedure EKG with atrial fibrillation with controlled ventricular response on 02/12/2019  -Remains in AF per telemetry review today, HR stable  -Will restart Coumadin per pharmacy -Reports great OP control, followed by [facility]  -Last INR, 2.1  3. Moderate AS:  -Per echocardiogram with mean gradient of 14 mmHg -No significant change from 2017 per echo review   4. HTN:  -Stable, 2/77, 96/54, 108/54 -Continue current regimen    Signed, Kathyrn Drown NP-C [facility] Pager: [phone] 02/13/2019, 9:00 AM     For questions or updates, please contact   Please consult [email or web address] for contact info under Cardiology/STEMI.

## 2019-02-13 NOTE — Progress Notes (Signed)
Pt received her coumadin for today per NP order.

## 2019-02-13 NOTE — Discharge Summary (Signed)
Discharge Summary    Patient ID: [name] MRN: [id number]; DOB: 1929-04-20  Admit date: 02/12/2019 Discharge date: 02/13/2019  Primary Care Provider: Laurey Morale, MD  Primary Cardiologist: Jenkins Rouge, MD   Discharge Diagnoses    Principal Problem:   NSTEMI (non-ST elevated myocardial infarction) [facility]) Active Problems:   Hypothyroidism   Hyperlipemia   Essential hypertension   Moderate mitral regurgitation   ATRIAL FIBRILLATION   Long term current use of anticoagulant   Aortic stenosis   Chest pain of uncertain etiology  Allergies Allergies  Allergen Reactions  . Codeine Nausea Only    REACTION: nausea   Diagnostic Studies/Procedures    Cardiac catheterization 02/12/2019:  [name] has essentially normal coronary arteries.  I did do a supravalvular aortogram to rule out dissection which was normal.  I did not cross the aortic valve.  She will need a 2D echo to rule out Takatsubo syndrome as a cause of her chest pain and mildly elevated troponins.  Her post procedure EKG showed atrial fibrillation with controlled ventricular response, nonspecific ST and T wave changes with narrow complex QRS.  She no longer has a right bundle branch block.  The sheath was removed and a TR band was placed on the right wrist to achieve patent hemostasis.  The patient left lab in stable condition.  Dr. Oval Linsey was notified of these results. _____________  Echocardiogram 02/12/2019:   1. The left ventricle has normal systolic function with an ejection fraction of 60-65%. The cavity size was normal. There is moderately increased left ventricular wall thickness. unable to assess due to severe MAC. Elevated left atrial and left  ventricular end-diastolic pressures There is right ventricular volume and pressure overload and right ventricular volume/pressure overload.  2. The aortic valve is tricuspid Moderate thickening of the aortic valve Moderate sclerosis of the aortic valve. Aortic valve  regurgitation is trivial by color flow Doppler. moderate stenosis of the aortic valve.  3. The right ventricle has mildly reduced systolic function. The cavity was moderately enlarged. There is mildly increased right ventricular wall thickness.  4. Left atrial size was massively dilated (151 ml/m2).  5. Right atrial size was moderately dilated.  6. The mitral valve is degenerative. Moderate thickening of the mitral valve leaflet. Moderate calcification of the anterior mitral valve leaflet. There is moderate to severe mitral annular calcification present. Mitral valve regurgitation is moderate  by color flow Doppler. The MR jet is centrally-directed. Moderate-severe mitral valve stenosis.  7. The tricuspid valve is not well visualized. Tricuspid valve regurgitation is moderate.  8. The aortic root and ascending aorta are normal in size and structure.  9. The inferior vena cava was dilated in size with <50% respiratory variability. 10. The interatrial septum is aneurysmal. 11. When compared to the prior study: 12/12/2017: LVEF 60-65%, moderate MS, mild to moderate MR, severe LAE, RVSP 44 mmHg.  History of Present Illness     PMH of PAF (on coumadin), moderate AS, HTN, HL, hypothyroidism who presented with chest pain. Found to have an NSTEMI.   [name] is an 85 yo female with PMH noted above. She is followed by Dr. Johnsie Cancel as an outpatient for her Afib. Hx of colon CA in 1994. Admitted for PNA back in 2/19 and blood pressure medications were held 2/2 to elevated Cr. She was last seen in the office on 12/04/2018 and reported being in her usual state of health. Currently lives at independent living facility. Cares mostly for herself, and  walks her dog on a regular basis.  On morning of presentation she was up around 4am which is not usual for her. Developed left sided chest pain with radiation down into the left arm. Did walk her dog outside and symptoms persisted. Began to feel short of breath and  nauseated. Waited until around 5:30am and called EMS. No ASA given 2/2 to her coumadin. On arrival to the ED she was given SL nitro with improvement in pain to a 3/10. EKG on arrival with new RBBB with inferolateral ST depression. In the ED labs showed stable electrolytes, Hgb 12.0, Cr 0.94, POC trop 0.15, INR 2.1. Placed on IV heparin via the ED.   Hospital Course     Troponin is found to be elevated and therefore plan was for cardiac catheterization performed on 02/12/2019 which showed essentially normal coronary arteries.  A supravalvular aortogram was performed to rule out dissection that was also found to be normal.  An echocardiogram was performed which showed an LVEF of 60 to 65% with normal wall motion and significant MAC with moderate MR.  There was moderate aortic stenosis with a mean gradient of 14 mmHg which appeared to be stable when compared to prior echocardiogram from 2017.  Her troponin down trended with a peak of 12.99 and a discharge troponin of 8.42 with no recurrent chest pain symptoms.  Her Lasix was increased to 40 mg twice daily thought that volume overload possibly contributed to her symptoms.  She will need a BMP in 1 to 2 weeks  Other hospital problems include:  -PAF:  -Post procedure EKG with atrial fibrillation with controlled ventricular response on 02/12/2019  -Remains in AF per telemetry review today, HR stable  -Will restart Coumadin per pharmacy>> Coumadin plan as follows: Coumadin 7.5 mg for 1 dose this evening then she will resume her home dose of 2.5 mg on Mondays and 5 mg on all other days.  Patient reports stable Coumadin levels for many years. -Encouraged to keep her Coumadin check appointment next Friday, 02/20/2019 -Reports great OP control, followed by [facility]  -Last INR, 2.1  -Moderate AS: -Per echocardiogram with mean gradient of 14 mmHg -No significant change from 2017 per echo review   -HTN: -Stable, 2/77, 96/54, 108/54 -Continue current regimen    Consultants: None   The patient has been seen and examined by Dr. Oval Linsey who feels that she is stable and ready for discharge today, 02/13/2019.  Due to the COVID 19 pandemic, she will be scheduled for a follow-up appointment as soon as possible, likely within the 4 to 8-week.  Cath site unremarkable   Discharge Vitals Blood pressure 132/77, pulse (!) 59, temperature 97.8 F (36.6 C), temperature source Oral, resp. rate 17, height 6' 1"  (1.854 m), weight 78.4 kg, SpO2 96 %.  Filed Weights   02/12/19 0701 02/12/19 1203 02/13/19 0525  Weight: 78.9 kg 83 kg 78.4 kg    Labs & Radiologic Studies    CBC Recent Labs    02/12/19 0642 02/13/19 0309  WBC 6.6 6.3  NEUTROABS 4.9  --   HGB 12.0 11.5*  HCT 37.3 35.1*  MCV 99.5 97.5  PLT 194 782   Basic Metabolic Panel Recent Labs    02/12/19 0642 02/13/19 0309  NA 133* 134*  K 4.8 4.2  CL 103 106  CO2 22 25  GLUCOSE 124* 99  BUN 20 13  CREATININE 0.94 0.78  CALCIUM 9.2 9.1  MG 2.1  --    Cardiac Enzymes  Recent Labs    02/12/19 1143 02/12/19 1639 02/12/19 2315  TROPONINI 4.93* 12.99* 8.42*   Fasting Lipid Panel Recent Labs    02/13/19 0309  CHOL 100  HDL 37*  LDLCALC 48  TRIG 73  CHOLHDL 2.7   Thyroid Function Tests No results for input(s): TSH, T4TOTAL, T3FREE, THYROIDAB in the last 72 hours.  Invalid input(s): FREET3 _____________  Dg Chest Port 1 View  Result Date: 02/12/2019 CLINICAL DATA:  Chest discomfort. EXAM: PORTABLE CHEST 1 VIEW COMPARISON:  01/17/2018.  01/16/2018. FINDINGS: Mediastinum hilar structures normal. Cardiomegaly again noted. Mild bilateral interstitial prominence with Kerley B-lines are again noted. Findings suggest mild congestive heart failure with interstitial edema. Pneumonitis can not be excluded. Similar findings noted on prior exam. No prominent pleural effusion. No pneumothorax. IMPRESSION: Cardiomegaly with mild bilateral interstitial prominence again. Findings most  consistent CHF. Pneumonitis can not be excluded. No significant change from prior exam. Electronically Signed   By: Marcello Moores  Register   On: 02/12/2019 07:15   Disposition   Pt is being discharged home today in good condition.  Follow-up Plans & Appointments    Follow-up Information    Josue Hector, MD Follow up.   Specialty:  Cardiology Why:  Our office will contact you with date and time of appointment Contact information: [address] [place] [address] [phone]          Discharge Instructions    Call MD for:  difficulty breathing, headache or visual disturbances   Complete by:  As directed    Call MD for:  extreme fatigue   Complete by:  As directed    Call MD for:  hives   Complete by:  As directed    Call MD for:  persistant dizziness or light-headedness   Complete by:  As directed    Call MD for:  persistant nausea and vomiting   Complete by:  As directed    Call MD for:  redness, tenderness, or signs of infection (pain, swelling, redness, odor or green/yellow discharge around incision site)   Complete by:  As directed    Call MD for:  severe uncontrolled pain   Complete by:  As directed    Call MD for:  temperature >100.4   Complete by:  As directed    Diet - low sodium heart healthy   Complete by:  As directed    Discharge instructions   Complete by:  As directed    Coumadin regimen: Coumadin 7.5 mg for 1 dose this evening prior to discharge, then resume home dose of 2.5 mg on Mondays and 5 mg on all other days.  INR Coumadin check appointment next Friday, 02/20/2019   No driving for 3 days. No lifting over 5 lbs for 1 week. No sexual activity for 1 week. Keep procedure site clean & dry. If you notice increased pain, swelling, bleeding or pus, call/return!  You may shower, but no soaking baths/hot tubs/pools for 1 week.   Increase activity slowly   Complete by:  As directed       Discharge Medications   Allergies as of 02/13/2019       Reactions   Codeine Nausea Only   REACTION: nausea      Medication List    STOP taking these medications   ciprofloxacin 500 MG tablet Commonly known as:  Cipro   spironolactone 25 MG tablet Commonly known as:  ALDACTONE     TAKE these medications  acetaminophen 500 MG tablet Commonly known as:  TYLENOL Take 2 tablets (1,000 mg total) by mouth 2 (two) times daily as needed.   ALPRAZolam 0.25 MG tablet Commonly known as:  XANAX TAKE 1 TABLET BY MOUTH AT BEDTIME AS NEEDED What changed:  reasons to take this   atorvastatin 20 MG tablet Commonly known as:  LIPITOR TAKE 1 TABLET BY MOUTH  DAILY What changed:  when to take this   Caltrate 600+D 600-400 MG-UNIT tablet Generic drug:  Calcium Carbonate-Vitamin D Take 1 tablet by mouth daily.   carvedilol 6.25 MG tablet Commonly known as:  COREG TAKE 1 TABLET BY MOUTH TWO  TIMES DAILY What changed:  when to take this   CENTRUM SILVER PO Take 1 tablet by mouth daily.   cetirizine 10 MG tablet Commonly known as:  ZYRTEC Take 1 tablet (10 mg total) by mouth daily.   furosemide 40 MG tablet Commonly known as:  LASIX Take 1 tablet (40 mg total) by mouth 2 (two) times daily. What changed:  when to take this   ketorolac 0.5 % ophthalmic solution Commonly known as:  ACULAR Place 1 drop into the right eye 2 (two) times daily.   nitroGLYCERIN 0.4 MG SL tablet Commonly known as:  NITROSTAT Place 1 tablet (0.4 mg total) under the tongue every 5 (five) minutes as needed for chest pain.   potassium chloride SA 20 MEQ tablet Commonly known as:  K-DUR,KLOR-CON TAKE 1 TABLET BY MOUTH TWO  TIMES DAILY   quinapril 20 MG tablet Commonly known as:  ACCUPRIL TAKE 1 TABLET BY MOUTH AT  BEDTIME   Synthroid 100 MCG tablet Generic drug:  levothyroxine Take 1 tablet (100 mcg total) by mouth daily before breakfast.   vitamin B-12 1000 MCG tablet Commonly known as:  CYANOCOBALAMIN Take 1,000 mcg by mouth daily.   warfarin 5  MG tablet Commonly known as:  COUMADIN Take as directed. If you are unsure how to take this medication, talk to your nurse or doctor. Original instructions:  TAKE 1 TO 1.5 TABLETS BY  MOUTH DAILY AS DIRECTED BY  COUMDAIN CLINIC What changed:    how much to take  how to take this  when to take this  additional instructions        Acute coronary syndrome (MI, NSTEMI, STEMI, etc) this admission?: No.    Outstanding Labs/Studies   BMET in 1-2weeks    INR, 02/20/2019 at 1115  Duration of Discharge Encounter   Greater than 30 minutes including physician time.  Signed, Kathyrn Drown, NP 02/13/2019, 1:35 PM

## 2019-02-13 NOTE — Progress Notes (Signed)
Discharge instructions reviewed with pt. Pt has no questions at this time. Right radial a level 0. Pt states she is ready for d/c. Niece is here to take pt home.

## 2019-02-13 NOTE — Progress Notes (Signed)
ANTICOAGULATION CONSULT NOTE - Initial Consult  Pharmacy Consult for warfarin Indication: atrial fibrillation  Allergies  Allergen Reactions  . Codeine Nausea Only    REACTION: nausea    Patient Measurements: Height: 6\' 1"  (185.4 cm) Weight: 172 lb 14.4 oz (78.4 kg) IBW/kg (Calculated) : 75.4   Vital Signs: Temp: 97.8 F (36.6 C) (03/20 0751) Temp Source: Oral (03/20 0751) BP: 132/77 (03/20 0751) Pulse Rate: 59 (03/20 0751)  Labs: Recent Labs    02/12/19 9326  02/12/19 0655 02/12/19 1143 02/12/19 1639 02/12/19 2315 02/13/19 0309 02/13/19 0845  HGB 12.0  --   --   --   --   --  11.5*  --   HCT 37.3  --   --   --   --   --  35.1*  --   PLT 194  --   --   --   --   --  172  --   APTT  --   --  47*  --   --   --   --   --   LABPROT  --   --  23.2*  --   --   --   --  21.7*  INR  --   --  2.1*  --   --   --   --  1.9*  CREATININE 0.94  --   --   --   --   --  0.78  --   TROPONINI  --    < > 0.36* 4.93* 12.99* 8.42*  --   --    < > = values in this interval not displayed.    Estimated Creatinine Clearance: 59 mL/min (by C-G formula based on SCr of 0.78 mg/dL).   Medical History: Past Medical History:  Diagnosis Date  . Anxiety   . Atrial fibrillation ([place])   . Cancer ([place])   . Cataract    REMOVED BILATERAL  . Chest pain, unspecified   . Chronic low back pain   . Diverticulosis of colon   . History of colon cancer   . History of colonic polyps   . History of colonoscopy   . History of uterine cancer   . Hyperlipidemia   . Hypertension   . Hypothyroid   . Impaired glucose tolerance   . Mitral valve disorder   . Osteoarthritis   . Osteoporosis   . Patent foramen ovale   . Peripheral neuropathy   . Peripheral vascular disease ([place])   . Splenic infarction   . UTI (lower urinary tract infection)   . Venous insufficiency   . Vitamin B12 deficiency     Assessment: 31 yoF on warfarin at home for hx AFib. INR 2.1 on admit now down to 1.9 after holding  for cardiac cath yesterday. Pharmacy consulted to resume warfarin with all invasive workup completed.  *Home Warfarin Dose = 2.5mg  Mon, 5mg  all other days  Goal of Therapy:  INR 2-3 Monitor platelets by anticoagulation protocol: Yes   Plan:  -Warfarin 7.5mg  PO x1 tonight - slight boost given missed dose yesterday with cath -Daily protime  Arrie Senate, PharmD, BCPS Clinical Pharmacist [phone] Please check AMION for all [facility] numbers 02/13/2019

## 2019-02-18 NOTE — Telephone Encounter (Signed)
This encounter was created in error - please disregard.

## 2019-02-19 ENCOUNTER — Telehealth: Payer: Self-pay

## 2019-02-19 NOTE — Telephone Encounter (Signed)

## 2019-02-20 ENCOUNTER — Ambulatory Visit (INDEPENDENT_AMBULATORY_CARE_PROVIDER_SITE_OTHER): Payer: Medicare Other | Admitting: *Deleted

## 2019-02-20 DIAGNOSIS — Z7901 Long term (current) use of anticoagulants: Secondary | ICD-10-CM

## 2019-02-20 DIAGNOSIS — I482 Chronic atrial fibrillation, unspecified: Secondary | ICD-10-CM | POA: Diagnosis not present

## 2019-02-20 DIAGNOSIS — I4821 Permanent atrial fibrillation: Secondary | ICD-10-CM

## 2019-02-20 LAB — POCT INR: INR: 2 (ref 2.0–3.0)

## 2019-02-20 NOTE — Patient Instructions (Addendum)
Description   Today take 1.5 tablets then continue on same dosage 1 tablet daily except for 1/2 tablet on Mondays. Recheck in 1 week. Call our office if you have any concerns or if you are put on any new medications [phone]

## 2019-02-26 ENCOUNTER — Telehealth: Payer: Self-pay

## 2019-02-26 NOTE — Telephone Encounter (Signed)

## 2019-02-27 ENCOUNTER — Ambulatory Visit (INDEPENDENT_AMBULATORY_CARE_PROVIDER_SITE_OTHER): Payer: Medicare Other

## 2019-02-27 ENCOUNTER — Other Ambulatory Visit: Payer: Self-pay

## 2019-02-27 DIAGNOSIS — Z7901 Long term (current) use of anticoagulants: Secondary | ICD-10-CM

## 2019-02-27 DIAGNOSIS — I4821 Permanent atrial fibrillation: Secondary | ICD-10-CM

## 2019-02-27 DIAGNOSIS — I482 Chronic atrial fibrillation, unspecified: Secondary | ICD-10-CM | POA: Diagnosis not present

## 2019-02-27 LAB — POCT INR: INR: 2.3 (ref 2.0–3.0)

## 2019-02-27 NOTE — Patient Instructions (Signed)
Description   Called spoke with pt, advised to continue on same dosage 1 tablet daily except for 1/2 tablet on Mondays. Recheck in 4 weeks. Call our office if you have any concerns or if you are put on any new medications [phone]

## 2019-03-13 ENCOUNTER — Ambulatory Visit: Payer: Medicare Other | Admitting: Family Medicine

## 2019-03-13 ENCOUNTER — Telehealth: Payer: Self-pay | Admitting: Family Medicine

## 2019-03-13 MED ORDER — CIPROFLOXACIN HCL 500 MG PO TABS: 500.0000 mg | ORAL_TABLET | Freq: Two times a day (BID) | ORAL | 0 refills | Status: DC

## 2019-03-13 NOTE — Telephone Encounter (Signed)
Dr. Sarajane Jews please advise on abx to send in

## 2019-03-13 NOTE — Telephone Encounter (Signed)
Called and spoke with pt and she is aware of rx that has been sent to the pharmacy and the appt for today has been cancelled.

## 2019-03-13 NOTE — Telephone Encounter (Signed)
Copied from [place] [id number]. Topic: General - Inquiry >> Mar 13, 2019  8:25 AM [name] wrote: Reason for CRM: pt called stating she is prone to UTI and asking if Dr. Sarajane Jews would call something in for her. She is in senior living and doesn't want to have to go into quarantine if she leaves the facility. Pt states she is having burning, frequency, and odor with urination. No fever (temp 96.7). Pt states she is normally put on cipro. Last UTI in 01/19/2019. Please call to advise.  [id number] [name], [place] [phone] (Phone) [phone] (Fax)

## 2019-03-13 NOTE — Telephone Encounter (Signed)
[name] does not have a smart phone or any type of device to do a virtual visit - [name] lives in Senior Living facility Phone [phone] (main line at facility in her room)  [name] requests a telephone visit today with Dr Sarajane Jews  Scheduled at 1:30 with Dr Sarajane Jews for a Telephone Visit

## 2019-03-13 NOTE — Addendum Note (Signed)
Addended by: Elie Confer on: 03/13/2019 11:00 AM   Modules accepted: Orders

## 2019-03-13 NOTE — Telephone Encounter (Signed)
Please cancel the telephone visit. Just call in Cipro 500 mg bid for 7 days

## 2019-03-27 ENCOUNTER — Telehealth: Payer: Self-pay

## 2019-03-27 NOTE — Telephone Encounter (Signed)
Virtual Visit Pre-Appointment Phone Call  "(Name), I am calling you today to discuss your upcoming appointment. We are currently trying to limit exposure to the virus that causes COVID-19 by seeing patients at home rather than in the office."  1. "What is the BEST phone number to call the day of the visit?" - include this in appointment notes  2. "Do you have or have access to (through a family member/friend) a smartphone with video capability that we can use for your visit?" a. If yes - list this number in appt notes as "cell" (if different from BEST phone #) and list the appointment type as a VIDEO visit in appointment notes b. If no - list the appointment type as a PHONE visit in appointment notes  3. Confirm consent - "In the setting of the current Covid19 crisis, you are scheduled for a (phone or video) visit with your provider on (date) at (time).  Just as we do with many in-office visits, in order for you to participate in this visit, we must obtain consent.  If you'd like, I can send this to your mychart (if signed up) or email for you to review.  Otherwise, I can obtain your verbal consent now.  All virtual visits are billed to your insurance company just like a normal visit would be.  By agreeing to a virtual visit, we'd like you to understand that the technology does not allow for your provider to perform an examination, and thus may limit your provider's ability to fully assess your condition. If your provider identifies any concerns that need to be evaluated in person, we will make arrangements to do so.  Finally, though the technology is pretty good, we cannot assure that it will always work on either your or our end, and in the setting of a video visit, we may have to convert it to a phone-only visit.  In either situation, we cannot ensure that we have a secure connection.  Are you willing to proceed?YES  4. Advise patient to be prepared - "Two hours prior to your appointment, go  ahead and check your blood pressure, pulse, oxygen saturation, and your weight (if you have the equipment to check those) and write them all down. When your visit starts, your provider will ask you for this information. If you have an Apple Watch or Kardia device, please plan to have heart rate information ready on the day of your appointment. Please have a pen and paper handy nearby the day of the visit as well."  5. Give patient instructions for MyChart download to smartphone OR [email or web address] as below if video visit (depending on what platform provider is using)  6. Inform patient they will receive a phone call 15 minutes prior to their appointment time (may be from unknown caller ID) so they should be prepared to answer    TELEPHONE CALL NOTE  ILYSSA GRENNAN has been deemed a candidate for a follow-up tele-health visit to limit community exposure during the Covid-19 pandemic. I spoke with the patient via phone to ensure availability of phone/video source, confirm preferred email & phone number, and discuss instructions and expectations.  I reminded FATEN FRIESON to be prepared with any vital sign and/or heart rhythm information that could potentially be obtained via home monitoring, at the time of her visit. I reminded MORIYA MITCHELL to expect a phone call prior to her visit.  Michaelyn Barter, RN 03/27/2019 10:44 AM    FULL  LENGTH CONSENT FOR TELE-HEALTH VISIT   I hereby voluntarily request, consent and authorize [facility] and its employed or contracted physicians, physician assistants, nurse practitioners or other licensed health care professionals (the Practitioner), to provide me with telemedicine health care services (the "Services") as deemed necessary by the treating Practitioner. I acknowledge and consent to receive the Services by the Practitioner via telemedicine. I understand that the telemedicine visit will involve communicating with the Practitioner through live  audiovisual communication technology and the disclosure of certain medical information by electronic transmission. I acknowledge that I have been given the opportunity to request an in-person assessment or other available alternative prior to the telemedicine visit and am voluntarily participating in the telemedicine visit.  I understand that I have the right to withhold or withdraw my consent to the use of telemedicine in the course of my care at any time, without affecting my right to future care or treatment, and that the Practitioner or I may terminate the telemedicine visit at any time. I understand that I have the right to inspect all information obtained and/or recorded in the course of the telemedicine visit and may receive copies of available information for a reasonable fee.  I understand that some of the potential risks of receiving the Services via telemedicine include:  Marland Kitchen Delay or interruption in medical evaluation due to technological equipment failure or disruption; . Information transmitted may not be sufficient (e.g. poor resolution of images) to allow for appropriate medical decision making by the Practitioner; and/or  . In rare instances, security protocols could fail, causing a breach of personal health information.  Furthermore, I acknowledge that it is my responsibility to provide information about my medical history, conditions and care that is complete and accurate to the best of my ability. I acknowledge that Practitioner's advice, recommendations, and/or decision may be based on factors not within their control, such as incomplete or inaccurate data provided by me or distortions of diagnostic images or specimens that may result from electronic transmissions. I understand that the practice of medicine is not an exact science and that Practitioner makes no warranties or guarantees regarding treatment outcomes. I acknowledge that I will receive a copy of this consent concurrently upon  execution via email to the email address I last provided but may also request a printed copy by calling the office of [facility].    I understand that my insurance will be billed for this visit.   I have read or had this consent read to me. . I understand the contents of this consent, which adequately explains the benefits and risks of the Services being provided via telemedicine.  . I have been provided ample opportunity to ask questions regarding this consent and the Services and have had my questions answered to my satisfaction. . I give my informed consent for the services to be provided through the use of telemedicine in my medical care  By participating in this telemedicine visit I agree to the above.

## 2019-03-31 NOTE — Progress Notes (Signed)
Virtual Visit via Telephone Note   This visit type was conducted due to national recommendations for restrictions regarding the COVID-19 Pandemic (e.g. social distancing) in an effort to limit this patient's exposure and mitigate transmission in our community.  Due to her co-morbid illnesses, this patient is at least at moderate risk for complications without adequate follow up.  This format is felt to be most appropriate for this patient at this time.  The patient did not have access to video technology/had technical difficulties with video requiring transitioning to audio format only (telephone).  All issues noted in this document were discussed and addressed.  No physical exam could be performed with this format.  Please refer to the patient's chart for her  consent to telehealth for [facility].   Date:  04/03/2019   ID:  [name], DOB 1928-12-09, MRN [id number]  Patient Location: Home Provider Location: Office  PCP:  Laurey Morale, MD  Cardiologist:  Jenkins Rouge, MD   Electrophysiologist:  None   Evaluation Performed:  Follow-Up Visit  Chief Complaint:  Afib  History of Present Illness:    85 y.o. with HTN, chronic afib , moderate AS, HLD Admitted to hospital in March 2020 with SSCP and mildly elevated troponin. New RBBB that eventually resolved. However cath done 02/12/19 showed normal coronary arteries. EF 60-65% no evidence of Takatsubo DCM. And  aortogram with no dissection AS more mild with mean gradient 14 mmHg. Peak troponin was 12.99. D/C with higher dose lasix 40 mg bid  And d/c aldactone ? Volume overload Rhythm through out was chronic afib. PE thought unlikely as INR was Rx on admission 2.1   Was walking dog when she got bad chest pain with diaphoresis Not clear what really happened Still with occasional sharp pains Living at [facility] now Niece [name] takes her for her INR checks Dining room is closed Groomer has her [place] puppy she's had for 12 years   The patient does not have symptoms concerning for COVID-19 infection (fever, chills, cough, or new shortness of breath).    Past Medical History:  Diagnosis Date  . Anxiety   . Atrial fibrillation ([place])   . Cancer ([place])   . Cataract    REMOVED BILATERAL  . Chest pain, unspecified   . Chronic low back pain   . Diverticulosis of colon   . History of colon cancer   . History of colonic polyps   . History of colonoscopy   . History of uterine cancer   . Hyperlipidemia   . Hypertension   . Hypothyroid   . Impaired glucose tolerance   . Mitral valve disorder   . Osteoarthritis   . Osteoporosis   . Patent foramen ovale   . Peripheral neuropathy   . Peripheral vascular disease ([place])   . Splenic infarction   . UTI (lower urinary tract infection)   . Venous insufficiency   . Vitamin B12 deficiency    Past Surgical History:  Procedure Laterality Date  . ANTERIOR AND POSTERIOR VAGINAL REPAIR  03/2006   Dr.Neal  . basal cell skin cancer Moh's surgery    . CATARACT EXTRACTION, BILATERAL    . COLONOSCOPY  05-25-10   per Dr. Henrene Pastor, benign polyps, repeat in 5 yrs   . EYE SURGERY    . hysterectomy for uterine cancer    . left carpal tunnel surgery  02/2007   Dr. Daylene Katayama  . LEFT HEART CATH AND CORONARY ANGIOGRAPHY N/A 02/12/2019  Procedure: LEFT HEART CATH AND CORONARY ANGIOGRAPHY;  Surgeon: Lorretta Harp, MD;  Location: [facility];  Service: Cardiovascular;  Laterality: N/A;  . lumbar laminectomy for spinal stenosis  2006   L3-4  . RETINAL DETACHMENT REPAIR W/ SCLERAL BUCKLE LE Right 03-01-14   per Dr. Zadie Rhine   . right hemicolectomy for colon cancer  1994     Current Meds  Medication Sig  . acetaminophen (TYLENOL) 500 MG tablet Take 2 tablets (1,000 mg total) by mouth 2 (two) times daily as needed.  . ALPRAZolam (XANAX) 0.25 MG tablet TAKE 1 TABLET BY MOUTH AT BEDTIME AS NEEDED (Patient taking differently: Take 0.25 mg by mouth at bedtime as needed for sleep. )  .  atorvastatin (LIPITOR) 20 MG tablet TAKE 1 TABLET BY MOUTH  DAILY (Patient taking differently: Take 20 mg by mouth daily at 6 PM. )  . Calcium Carbonate-Vitamin D (CALTRATE 600+D) 600-400 MG-UNIT per tablet Take 1 tablet by mouth daily.    . carvedilol (COREG) 6.25 MG tablet TAKE 1 TABLET BY MOUTH TWO  TIMES DAILY (Patient taking differently: Take 6.25 mg by mouth 2 (two) times daily with a meal. )  . cetirizine (ZYRTEC) 10 MG tablet Take 1 tablet (10 mg total) by mouth daily.  Marland Kitchen CLIMARA 0.1 MG/24HR patch Use as directed  . furosemide (LASIX) 40 MG tablet Take 1 tablet (40 mg total) by mouth 2 (two) times daily.  Marland Kitchen ketorolac (ACULAR) 0.5 % ophthalmic solution Place 1 drop into the right eye 2 (two) times daily.   . Multiple Vitamins-Minerals (CENTRUM SILVER PO) Take 1 tablet by mouth daily.    . nitroGLYCERIN (NITROSTAT) 0.4 MG SL tablet Place 1 tablet (0.4 mg total) under the tongue every 5 (five) minutes as needed for chest pain.  . potassium chloride SA (K-DUR,KLOR-CON) 20 MEQ tablet TAKE 1 TABLET BY MOUTH TWO  TIMES DAILY (Patient taking differently: Take 20 mEq by mouth 2 (two) times daily. )  . quinapril (ACCUPRIL) 20 MG tablet TAKE 1 TABLET BY MOUTH AT  BEDTIME (Patient taking differently: Take 20 mg by mouth at bedtime. )  . spironolactone (ALDACTONE) 25 MG tablet Take 25 mg by mouth daily.  Marland Kitchen SYNTHROID 100 MCG tablet Take 1 tablet (100 mcg total) by mouth daily before breakfast.  . vitamin B-12 (CYANOCOBALAMIN) 1000 MCG tablet Take 1,000 mcg by mouth daily.    Marland Kitchen warfarin (COUMADIN) 5 MG tablet TAKE 1 TO 1.5 TABLETS BY  MOUTH DAILY AS DIRECTED BY  COUMDAIN CLINIC (Patient taking differently: Take 2.5-5 mg by mouth See admin instructions. Take 1/2 tablet on Monday then take 1 tablet all the other days)  . [DISCONTINUED] ciprofloxacin (CIPRO) 500 MG tablet Take 1 tablet (500 mg total) by mouth 2 (two) times daily.     Allergies:   Codeine   Social History   Tobacco Use  . Smoking status:  Never Smoker  . Smokeless tobacco: Never Used  Substance Use Topics  . Alcohol use: Yes    Alcohol/week: 0.0 standard drinks    Comment: occ  . Drug use: No     Family Hx: The patient's family history includes Cancer in her brother and mother; Heart disease in her sister; Heart failure in her father; Stroke in her sister.  ROS:   Please see the history of present illness.     All other systems reviewed and are negative.   Prior CV studies:   The following studies were reviewed today:  Echo  02/12/19 Cath 02/12/19  Labs/Other Tests and Data Reviewed:    EKG:   afib nonspecific ST changes lateral T wave inversion   Recent Labs: 06/13/2018: ALT 14; TSH 0.12 02/12/2019: Magnesium 2.1 02/13/2019: BUN 13; Creatinine, Ser 0.78; Hemoglobin 11.5; Platelets 172; Potassium 4.2; Sodium 134   Recent Lipid Panel Lab Results  Component Value Date/Time   CHOL 100 02/13/2019 03:09 AM   TRIG 73 02/13/2019 03:09 AM   HDL 37 (L) 02/13/2019 03:09 AM   CHOLHDL 2.7 02/13/2019 03:09 AM   LDLCALC 48 02/13/2019 03:09 AM    Wt Readings from Last 3 Encounters:  04/03/19 78.5 kg  02/13/19 78.4 kg  01/19/19 81.3 kg     Objective:    Vital Signs:  BP 117/74   Pulse 66   Temp (!) 96.3 F (35.7 C)   Ht 5\' 11"  (1.803 m)   Wt 78.5 kg   BMI 24.13 kg/m    No exam phone visit   ASSESSMENT & PLAN:    1. Chest Pain:  Troponin max around 13 with lateral T wave inversions however cath 02/12/19 with no epicardial CAD. May have been embolic event as she is in chronic afib but INR was 2.1 on admission. TTE with normal EF and no evidence of RWMA or Takatsubo. Given age don't think MRI to assess for myocarditis in order 2. Afib: chronic good rate control INR 2.3 a month ago and she has been on stable dose for years 3. HTN: Well controlled.  Continue current medications and low sodium Dash type diet.   4. HLD  On statin labs with primary   COVID-19 Education: The signs and symptoms of COVID-19 were  discussed with the patient and how to seek care for testing (follow up with PCP or arrange E-visit).  The importance of social distancing was discussed today.  Time:   Today, I have spent 30 minutes with the patient with telehealth technology discussing the above problems.     Medication Adjustments/Labs and Tests Ordered: Current medicines are reviewed at length with the patient today.  Concerns regarding medicines are outlined above.   Tests Ordered: No orders of the defined types were placed in this encounter.   Medication Changes: No orders of the defined types were placed in this encounter.   Disposition:  F/U in 3 months   Signed, Jenkins Rouge, MD  04/03/2019 8:08 AM    [place]

## 2019-04-01 ENCOUNTER — Telehealth: Payer: Self-pay

## 2019-04-01 NOTE — Telephone Encounter (Signed)

## 2019-04-03 ENCOUNTER — Other Ambulatory Visit: Payer: Medicare Other

## 2019-04-03 ENCOUNTER — Telehealth (INDEPENDENT_AMBULATORY_CARE_PROVIDER_SITE_OTHER): Payer: Medicare Other | Admitting: Cardiovascular Disease

## 2019-04-03 ENCOUNTER — Ambulatory Visit (INDEPENDENT_AMBULATORY_CARE_PROVIDER_SITE_OTHER): Payer: Medicare Other | Admitting: Pharmacist

## 2019-04-03 ENCOUNTER — Other Ambulatory Visit: Payer: Self-pay

## 2019-04-03 VITALS — BP 117/74 | HR 66 | Temp 96.3°F | Ht 71.0 in | Wt 173.0 lb

## 2019-04-03 DIAGNOSIS — I482 Chronic atrial fibrillation, unspecified: Secondary | ICD-10-CM

## 2019-04-03 DIAGNOSIS — I10 Essential (primary) hypertension: Secondary | ICD-10-CM | POA: Diagnosis not present

## 2019-04-03 DIAGNOSIS — Z7901 Long term (current) use of anticoagulants: Secondary | ICD-10-CM

## 2019-04-03 DIAGNOSIS — R0789 Other chest pain: Secondary | ICD-10-CM

## 2019-04-03 DIAGNOSIS — N289 Disorder of kidney and ureter, unspecified: Secondary | ICD-10-CM | POA: Diagnosis not present

## 2019-04-03 LAB — POCT INR: INR: 2.6 (ref 2.0–3.0)

## 2019-04-03 LAB — BASIC METABOLIC PANEL
BUN/Creatinine Ratio: 23 (ref 12–28)
BUN: 27 mg/dL (ref 8–27)
CO2: 21 mmol/L (ref 20–29)
Calcium: 9.9 mg/dL (ref 8.7–10.3)
Chloride: 100 mmol/L (ref 96–106)
Creatinine, Ser: 1.17 mg/dL — ABNORMAL HIGH (ref 0.57–1.00)
GFR calc Af Amer: 48 mL/min/{1.73_m2} — ABNORMAL LOW (ref >59)
GFR calc non Af Amer: 42 mL/min/{1.73_m2} — ABNORMAL LOW (ref >59)
Glucose: 97 mg/dL (ref 65–99)
Potassium: 5.2 mmol/L (ref 3.5–5.2)
Sodium: 138 mmol/L (ref 134–144)

## 2019-04-03 NOTE — Patient Instructions (Addendum)
Medication Instructions:   If you need a refill on your cardiac medications before your next appointment, please call your pharmacy.   Lab work:  If you have labs (blood work) drawn today and your tests are completely normal, you will receive your results only by: Marland Kitchen MyChart Message (if you have MyChart) OR . A paper copy in the mail If you have any lab test that is abnormal or we need to change your treatment, we will call you to review the results.  Testing/Procedures: None ordered today.  Follow-Up: At [facility], you and your health needs are our priority.  As part of our continuing mission to provide you with exceptional heart care, we have created designated Provider Care Teams.  These Care Teams include your primary Cardiologist (physician) and Advanced Practice Providers (APPs -  Physician Assistants and Nurse Practitioners) who all work together to provide you with the care you need, when you need it. You will need a follow up appointment in 3 months.   You may see Jenkins Rouge, MD or one of the following Advanced Practice Providers on your designated Care Team:   Truitt Merle, NP Cecilie Kicks, NP . Kathyrn Drown, NP    Someone form our office will call you with an appointment for your 3 month follow up.

## 2019-04-03 NOTE — Patient Instructions (Signed)
Description   Continue on same dosage 1 tablet daily except for 1/2 tablet on Mondays. Recheck in 8 weeks. Call our office if you have any concerns or if you are put on any new medications [phone]

## 2019-04-06 ENCOUNTER — Telehealth: Payer: Self-pay | Admitting: *Deleted

## 2019-04-06 DIAGNOSIS — N289 Disorder of kidney and ureter, unspecified: Secondary | ICD-10-CM

## 2019-04-06 DIAGNOSIS — I5032 Chronic diastolic (congestive) heart failure: Secondary | ICD-10-CM

## 2019-04-06 MED ORDER — POTASSIUM CHLORIDE CRYS ER 20 MEQ PO TBCR: 20.0000 meq | EXTENDED_RELEASE_TABLET | Freq: Every day | ORAL | 3 refills | Status: DC

## 2019-04-06 NOTE — Telephone Encounter (Signed)
-----   Message from Ledora Bottcher, [place] sent at 04/06/2019 11:26 AM EDT ----- Renal function is mildly reduced and K mildly elevated in the setting of Lasix and spironolactone.  Please ask her to reduce her potassium to once daily.  I will defer to Dr. Johnsie Cancel for further recommendations as he saw her on the day these labs were drawn.  Would advise repeat lab work in 1 week.

## 2019-04-06 NOTE — Telephone Encounter (Signed)
I spoke with pt and reviewed medication instructions with her.  She will come in for BMP on 04/13/19.

## 2019-04-13 ENCOUNTER — Other Ambulatory Visit: Payer: Medicare Other | Admitting: *Deleted

## 2019-04-13 ENCOUNTER — Other Ambulatory Visit: Payer: Self-pay

## 2019-04-13 DIAGNOSIS — N289 Disorder of kidney and ureter, unspecified: Secondary | ICD-10-CM

## 2019-04-13 DIAGNOSIS — I5032 Chronic diastolic (congestive) heart failure: Secondary | ICD-10-CM | POA: Diagnosis not present

## 2019-04-13 LAB — BASIC METABOLIC PANEL
BUN/Creatinine Ratio: 20 (ref 12–28)
BUN: 23 mg/dL (ref 8–27)
CO2: 22 mmol/L (ref 20–29)
Calcium: 9.5 mg/dL (ref 8.7–10.3)
Chloride: 98 mmol/L (ref 96–106)
Creatinine, Ser: 1.13 mg/dL — ABNORMAL HIGH (ref 0.57–1.00)
GFR calc Af Amer: 50 mL/min/{1.73_m2} — ABNORMAL LOW (ref >59)
GFR calc non Af Amer: 43 mL/min/{1.73_m2} — ABNORMAL LOW (ref >59)
Glucose: 115 mg/dL — ABNORMAL HIGH (ref 65–99)
Potassium: 4.6 mmol/L (ref 3.5–5.2)
Sodium: 135 mmol/L (ref 134–144)

## 2019-04-22 ENCOUNTER — Other Ambulatory Visit: Payer: Self-pay | Admitting: Family Medicine

## 2019-05-09 ENCOUNTER — Ambulatory Visit (INDEPENDENT_AMBULATORY_CARE_PROVIDER_SITE_OTHER): Payer: Medicare Other | Admitting: Family Medicine

## 2019-05-09 ENCOUNTER — Other Ambulatory Visit: Payer: Self-pay

## 2019-05-09 ENCOUNTER — Encounter: Payer: Self-pay | Admitting: Family Medicine

## 2019-05-09 DIAGNOSIS — N3000 Acute cystitis without hematuria: Secondary | ICD-10-CM | POA: Diagnosis not present

## 2019-05-09 MED ORDER — CIPROFLOXACIN HCL 250 MG PO TABS: 250.0000 mg | ORAL_TABLET | Freq: Two times a day (BID) | ORAL | 0 refills | Status: AC

## 2019-05-09 NOTE — Progress Notes (Signed)
Chief Complaint  Patient presents with  . Urinary Frequency  . Dysuria    [name] is a 85 y.o. female here for possible UTI. Due to COVID-19 pandemic, we are interacting via telephone. I verified patient's ID using 2 identifiers. Patient agreed to proceed with visit via this method. Patient is at home, I am at office. Patient and I are present for visit.   Duration: 1 day. Symptoms: urinary frequency, urinary retention, nausea and dysuria Denies: hematuria, vaginal discharge Hx of recurrent UTI? Yes  Pt reports doing very well w 5 d course of Cipro from her usual PCP.   ROS:  Constitutional: denies fever GU: As noted in HPI  Past Medical History:  Diagnosis Date  . Anxiety   . Atrial fibrillation ([place])   . Cancer ([place])   . Cataract    REMOVED BILATERAL  . Chest pain, unspecified   . Chronic low back pain   . Diverticulosis of colon   . History of colon cancer   . History of colonic polyps   . History of colonoscopy   . History of uterine cancer   . Hyperlipidemia   . Hypertension   . Hypothyroid   . Impaired glucose tolerance   . Mitral valve disorder   . Osteoarthritis   . Osteoporosis   . Patent foramen ovale   . Peripheral neuropathy   . Peripheral vascular disease ([place])   . Splenic infarction   . UTI (lower urinary tract infection)   . Venous insufficiency   . Vitamin B12 deficiency    Exam No conversational dyspnea Age appropriate judgment and insight Nml affect and mood   Acute cystitis without hematuria - Plan: ciprofloxacin (CIPRO) 250 MG tablet, given s/s's, will tx. She has done well with Cipro, will go forth with that.  Stay hydrated. Total time spent- 6 min.  Seek immediate care if pt starts to develop fevers, new/worsening symptoms, uncontrollable N/V. F/u prn. The patient voiced understanding and agreement to the plan.  [place], DO 05/09/19 10:38 AM

## 2019-05-21 ENCOUNTER — Telehealth: Payer: Self-pay

## 2019-05-21 NOTE — Telephone Encounter (Signed)

## 2019-05-22 ENCOUNTER — Other Ambulatory Visit: Payer: Self-pay | Admitting: Family Medicine

## 2019-05-26 NOTE — Telephone Encounter (Signed)
Call in #[phone] with 5 rf 

## 2019-05-26 NOTE — Telephone Encounter (Signed)
Dr. Fry please advise on refill. Thanks  

## 2019-05-28 ENCOUNTER — Ambulatory Visit (INDEPENDENT_AMBULATORY_CARE_PROVIDER_SITE_OTHER): Payer: Medicare Other | Admitting: *Deleted

## 2019-05-28 ENCOUNTER — Other Ambulatory Visit: Payer: Self-pay

## 2019-05-28 DIAGNOSIS — Z7901 Long term (current) use of anticoagulants: Secondary | ICD-10-CM

## 2019-05-28 DIAGNOSIS — I482 Chronic atrial fibrillation, unspecified: Secondary | ICD-10-CM | POA: Diagnosis not present

## 2019-05-28 LAB — POCT INR: INR: 2.2 (ref 2.0–3.0)

## 2019-05-28 NOTE — Patient Instructions (Signed)
Description   Continue on same dosage 1 tablet daily except for 1/2 tablet on Mondays. Recheck in 8 weeks. Call our office if you have any concerns or if you are put on any new medications [phone]

## 2019-06-09 DIAGNOSIS — L821 Other seborrheic keratosis: Secondary | ICD-10-CM | POA: Diagnosis not present

## 2019-06-09 DIAGNOSIS — Z961 Presence of intraocular lens: Secondary | ICD-10-CM | POA: Diagnosis not present

## 2019-06-09 DIAGNOSIS — L570 Actinic keratosis: Secondary | ICD-10-CM | POA: Diagnosis not present

## 2019-06-09 DIAGNOSIS — H31011 Macula scars of posterior pole (postinflammatory) (post-traumatic), right eye: Secondary | ICD-10-CM | POA: Diagnosis not present

## 2019-06-09 DIAGNOSIS — Z85828 Personal history of other malignant neoplasm of skin: Secondary | ICD-10-CM | POA: Diagnosis not present

## 2019-06-09 DIAGNOSIS — C44519 Basal cell carcinoma of skin of other part of trunk: Secondary | ICD-10-CM | POA: Diagnosis not present

## 2019-06-09 DIAGNOSIS — L820 Inflamed seborrheic keratosis: Secondary | ICD-10-CM | POA: Diagnosis not present

## 2019-06-09 DIAGNOSIS — H538 Other visual disturbances: Secondary | ICD-10-CM | POA: Diagnosis not present

## 2019-06-09 DIAGNOSIS — L718 Other rosacea: Secondary | ICD-10-CM | POA: Diagnosis not present

## 2019-06-09 DIAGNOSIS — D485 Neoplasm of uncertain behavior of skin: Secondary | ICD-10-CM | POA: Diagnosis not present

## 2019-06-13 ENCOUNTER — Encounter: Payer: Self-pay | Admitting: Family Medicine

## 2019-06-13 ENCOUNTER — Ambulatory Visit (INDEPENDENT_AMBULATORY_CARE_PROVIDER_SITE_OTHER): Payer: Medicare Other | Admitting: Family Medicine

## 2019-06-13 ENCOUNTER — Other Ambulatory Visit: Payer: Self-pay

## 2019-06-13 VITALS — Temp 97.6°F

## 2019-06-13 DIAGNOSIS — N183 Chronic kidney disease, stage 3 unspecified: Secondary | ICD-10-CM

## 2019-06-13 DIAGNOSIS — N3000 Acute cystitis without hematuria: Secondary | ICD-10-CM | POA: Diagnosis not present

## 2019-06-13 MED ORDER — CEPHALEXIN 500 MG PO CAPS: 500.0000 mg | ORAL_CAPSULE | Freq: Two times a day (BID) | ORAL | 0 refills | Status: DC

## 2019-06-13 NOTE — Progress Notes (Signed)
Phone [phone]  I acted as a Education administrator for Dr. Jenkins Rouge, LPN Subjective:  Virtual visit via phonenote Chief Complaint  Patient presents with  . Dysuria  . Urinary Frequency    This visit type was conducted due to national recommendations for restrictions regarding the COVID-19 Pandemic (e.g. social distancing).  This format is felt to be most appropriate for this patient at this time balancing risks to patient and risks to population by having him in for in person visit.  All issues noted in this document were discussed and addressed.  No physical exam was performed (except for noted visual exam or audio findings with Telehealth visits).  The patient has consented to conduct a Telehealth visit and understands insurance will be billed.   Our team/I connected with [name] at 12:20 PM EDT by phone (patient did not have equipment for webex) and verified that I am speaking with the correct person using two identifiers.  Location patient: Home-[place] Location provider: [facility], office Persons participating in the virtual visit:  patient  Time on phone: 7 minutes Counseling provided about follow up if fails to improve  Our team/I discussed the limitations of evaluation and management by telemedicine and the availability of in person appointments. In light of current covid-19 pandemic, patient also understands that we are trying to protect them by minimizing in office contact if at all possible.  The patient expressed consent for telemedicine visit and agreed to proceed. Patient understands insurance will be billed.   ROS-  See below  Past Medical History-  Patient Active Problem List   Diagnosis Date Noted  . NSTEMI (non-ST elevated myocardial infarction) ([place]) 02/12/2019  . Chest pain of uncertain etiology 71/04/2693  . Sore throat 10/30/2018  . Fatigue 10/30/2018  . Dysuria 10/30/2018  . Hypoxemia 01/16/2018  . Lower respiratory infection 01/16/2018  .  History of colon cancer in adulthood 01/16/2018  . Diastolic congestive heart failure with preserved left ventricular function, NYHA class 2 ([place]) 01/16/2018  . SBO (small bowel obstruction) ([place]) 04/02/2017  . CAP (community acquired pneumonia) 02/24/2016  . Fever blister 02/24/2016  . Bilateral pneumonia 02/17/2016  . Acute respiratory failure with hypercapnia ([place]) 02/17/2016  . Acute diastolic heart failure ([place])   . Dehydration 12/15/2015  . Sepsis ([place]) 12/15/2015  . Enteritis 12/15/2015  . Diarrhea   . Charcot's joint of left foot 11/24/2015  . Vasculitis ([place]) 03/11/2015  . Aortic stenosis 09/14/2013  . Long term current use of anticoagulant 01/09/2011  . COLONIC POLYPS 10/29/2010  . DIVERTICULOSIS OF COLON 10/29/2010  . ABNORMAL CV (STRESS) TEST 04/20/2010  . CHEST PAIN UNSPECIFIED 04/17/2010  . VITAMIN B12 DEFICIENCY 12/23/2008  . IMPAIRED GLUCOSE TOLERANCE 12/23/2008  . PERIPHERAL NEUROPATHY 06/22/2008  . SPLENIC INFARCTION 02/20/2008  . Anxiety state 02/20/2008  . Moderate mitral regurgitation 02/20/2008  . PERIPHERAL VASCULAR DISEASE 02/20/2008  . Venous (peripheral) insufficiency 02/20/2008  . URINARY TRACT INFECTION 02/20/2008  . Osteoarthritis 02/20/2008  . OSTEOPOROSIS 02/20/2008  . UTERINE CANCER, HX OF 02/20/2008  . Malignant neoplasm of colon ([place]) 01/13/2008  . Hypothyroidism 01/13/2008  . Hyperlipemia 01/13/2008  . Essential hypertension 01/13/2008  . ATRIAL FIBRILLATION 01/13/2008  . LOW BACK PAIN, CHRONIC 01/13/2008    Medications- reviewed and updated Current Outpatient Medications  Medication Sig Dispense Refill  . acetaminophen (TYLENOL) 500 MG tablet Take 2 tablets (1,000 mg total) by mouth 2 (two) times daily as needed. 30 tablet 0  . ALPRAZolam (XANAX) 0.25 MG tablet  TAKE 1 TABLET BY MOUTH AT BEDTIME AS NEEDED 30 tablet 5  . atorvastatin (LIPITOR) 20 MG tablet TAKE 1 TABLET BY MOUTH  DAILY (Patient taking differently: Take 20 mg by mouth  daily at 6 PM. ) 90 tablet 1  . Calcium Carbonate-Vitamin D (CALTRATE 600+D) 600-400 MG-UNIT per tablet Take 1 tablet by mouth daily.      . carvedilol (COREG) 6.25 MG tablet TAKE 1 TABLET BY MOUTH TWO  TIMES DAILY (Patient taking differently: Take 6.25 mg by mouth 2 (two) times daily with a meal. ) 180 tablet 2  . cetirizine (ZYRTEC) 10 MG tablet Take 1 tablet (10 mg total) by mouth daily. 30 tablet 0  . CLIMARA 0.1 MG/24HR patch Use as directed    . furosemide (LASIX) 40 MG tablet Take 1 tablet (40 mg total) by mouth 2 (two) times daily. 180 tablet 4  . ketorolac (ACULAR) 0.5 % ophthalmic solution Place 1 drop into the right eye 2 (two) times daily.     . Multiple Vitamins-Minerals (CENTRUM SILVER PO) Take 1 tablet by mouth daily.      . nitroGLYCERIN (NITROSTAT) 0.4 MG SL tablet Place 1 tablet (0.4 mg total) under the tongue every 5 (five) minutes as needed for chest pain. 25 tablet 0  . potassium chloride SA (K-DUR) 20 MEQ tablet Take 1 tablet (20 mEq total) by mouth daily. 90 tablet 3  . quinapril (ACCUPRIL) 20 MG tablet TAKE 1 TABLET BY MOUTH AT  BEDTIME (Patient taking differently: Take 20 mg by mouth at bedtime. ) 90 tablet 3  . spironolactone (ALDACTONE) 25 MG tablet Take 25 mg by mouth daily.    Marland Kitchen SYNTHROID 100 MCG tablet TAKE 1 TABLET BY MOUTH  DAILY BEFORE BREAKFAST 90 tablet 3  . vitamin B-12 (CYANOCOBALAMIN) 1000 MCG tablet Take 1,000 mcg by mouth daily.      Marland Kitchen warfarin (COUMADIN) 5 MG tablet TAKE 1 TO 1.5 TABLETS BY  MOUTH DAILY AS DIRECTED BY  COUMDAIN CLINIC (Patient taking differently: Take 2.5-5 mg by mouth See admin instructions. Take 1/2 tablet on Monday then take 1 tablet all the other days) 105 tablet 3  . cephALEXin (KEFLEX) 500 MG capsule Take 1 capsule (500 mg total) by mouth 2 (two) times daily. 14 capsule 0   No current facility-administered medications for this visit.      Objective:  Temp 97.6 F (36.4 C) (Oral) Comment: Per pt self reported vitals  Nonlabored  voice, normal speech     Assessment and Plan   #Concern for UTI S: Patients symptoms started Wednesday. She states she is prone to get UTIS.  Complains of dysuria: yes mild burning; polyuria: yes; nocturia: 2x nightly; urgency: yes.  Symptoms are mildly worsening. Has some lower abdominal pressure. Marland Kitchen  ROS- no fever, chills, nausea, vomiting, flank pain. No blood in urine.  A/P: UA not able to be obtained due to telephone visit. Likely UTI based off symptoms and history. Would normally get a  Culture but we are unable to obtain today- she agrees if symptoms fail to improve by Monday to schedule an in office visit with Dr. Sarajane Jews or his office to collect culture. Empiric treatment with: keflex 500mg  BID (adjusted down to to CKD stage III) Patient to follow up if new or worsening symptoms or failure to improve.   Recommended follow up:  As needed if symptoms fail to improve  Future Appointments  Date Time Provider [place]  07/27/2019  9:15 AM Josue Hector,  MD [facility]  07/27/2019  9:45 AM [facility]   Lab/Order associations:   ICD-10-CM   1. Acute cystitis without hematuria  N30.00   2. CKD (chronic kidney disease), stage III (HCC)  N18.3    Meds ordered this encounter  Medications  . cephALEXin (KEFLEX) 500 MG capsule    Sig: Take 1 capsule (500 mg total) by mouth 2 (two) times daily.    Dispense:  14 capsule    Refill:  0   Return precautions advised.  Chloe Reddish, MD

## 2019-06-13 NOTE — Patient Instructions (Signed)
Health Maintenance Due  Topic Date Due  . TETANUS/TDAP  03/16/1950  . PNA vac Low Risk Adult (1 of 2 - PCV13) 03/16/1996    Depression screen [facility] 2/9 01/21/2018 08/09/2016 03/11/2015  Decreased Interest 0 0 0  Down, Depressed, Hopeless 0 0 0  PHQ - 2 Score 0 0 0  Some recent data might be hidden

## 2019-07-14 NOTE — Progress Notes (Signed)
Virtual Visit via Telephone Note   This visit type was conducted due to national recommendations for restrictions regarding the COVID-19 Pandemic (e.g. social distancing) in an effort to limit this patient's exposure and mitigate transmission in our community.  Due to her co-morbid illnesses, this patient is at least at moderate risk for complications without adequate follow up.  This format is felt to be most appropriate for this patient at this time.  The patient did not have access to video technology/had technical difficulties with video requiring transitioning to audio format only (telephone).  All issues noted in this document were discussed and addressed.  No physical exam could be performed with this format.  Please refer to the patient's chart for her  consent to telehealth for [facility].   Date:  07/27/2019   ID:  [name], DOB 08/30/1929, MRN [id number]  Patient Location: Home Provider Location: Office  PCP:  Laurey Morale, MD  Cardiologist:  Jenkins Rouge, MD   Electrophysiologist:  None   Evaluation Performed:  Follow-Up Visit  Chief Complaint:  Afib  History of Present Illness:    85 y.o. with HTN, chronic afib , moderate AS, HLD Admitted to hospital in March 2020 with SSCP and mildly elevated troponin. New RBBB that eventually resolved. However cath done 02/12/19 showed normal coronary arteries. EF 60-65% no evidence of Takatsubo DCM. And  aortogram with no dissection AS more mild with mean gradient 14 mmHg. Peak troponin was 12.99. D/C with higher dose lasix 40 mg bid  And d/c aldactone ? Volume overload Rhythm through out was chronic afib. 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    Past Medical History:  Diagnosis Date  . Anxiety   . Atrial fibrillation ([place])   . Cancer ([place])   . Cataract    REMOVED BILATERAL  . Chest pain, unspecified   . Chronic low back pain   . Diverticulosis of colon   . History of colon cancer   . History of colonic polyps   . History of colonoscopy   . History of uterine cancer   . Hyperlipidemia   . Hypertension   . Hypothyroid   . Impaired glucose tolerance   . Mitral valve disorder   . Osteoarthritis   . Osteoporosis   . Patent foramen ovale   . Peripheral neuropathy   . Peripheral vascular disease ([place])   . Splenic infarction   . UTI (lower urinary tract infection)   . Venous insufficiency   . Vitamin B12 deficiency    Past Surgical History:  Procedure Laterality Date  . ANTERIOR AND POSTERIOR VAGINAL REPAIR  03/2006   Dr.Neal  . basal cell skin cancer Moh's surgery    . CATARACT EXTRACTION, BILATERAL    . COLONOSCOPY  05-25-10   per Dr. Henrene Pastor, benign polyps, repeat in 5 yrs   . EYE SURGERY    .  hysterectomy for uterine cancer    . left carpal tunnel surgery  02/2007   Dr. Daylene Katayama  . LEFT HEART CATH AND CORONARY ANGIOGRAPHY N/A 02/12/2019   Procedure: LEFT HEART CATH AND CORONARY ANGIOGRAPHY;  Surgeon: Lorretta Harp, MD;  Location: [facility];  Service: Cardiovascular;  Laterality: N/A;  . lumbar laminectomy for spinal stenosis  2006   L3-4  . RETINAL DETACHMENT REPAIR W/ SCLERAL BUCKLE LE Right 03-01-14   per Dr. Zadie Rhine   . right hemicolectomy for colon cancer  1994     Current Meds  Medication Sig  . acetaminophen (TYLENOL) 500 MG tablet Take 2 tablets (1,000 mg total) by mouth 2 (two) times daily as needed.  . ALPRAZolam (XANAX)  0.25 MG tablet TAKE 1 TABLET BY MOUTH AT BEDTIME AS NEEDED  . atorvastatin (LIPITOR) 20 MG tablet TAKE 1 TABLET BY MOUTH  DAILY (Patient taking differently: Take 20 mg by mouth daily at 6 PM. )  . Calcium Carbonate-Vitamin D (CALTRATE 600+D) 600-400 MG-UNIT per tablet Take 1 tablet by mouth daily.    . carvedilol (COREG) 6.25 MG tablet TAKE 1 TABLET BY MOUTH TWO  TIMES DAILY (Patient taking differently: Take 6.25 mg by mouth 2 (two) times daily with a meal. )  . cetirizine (ZYRTEC) 10 MG tablet Take 1 tablet (10 mg total) by mouth daily.  Marland Kitchen CLIMARA 0.1 MG/24HR patch Use as directed  . furosemide (LASIX) 40 MG tablet Take 1 tablet (40 mg total) by mouth 2 (two) times daily.  Marland Kitchen ketorolac (ACULAR) 0.5 % ophthalmic solution Place 1 drop into the right eye 2 (two) times daily.   . Multiple Vitamins-Minerals (CENTRUM SILVER PO) Take 1 tablet by mouth daily.    . nitroGLYCERIN (NITROSTAT) 0.4 MG SL tablet Place 1 tablet (0.4 mg total) under the tongue every 5 (five) minutes as needed for chest pain.  . potassium chloride SA (K-DUR) 20 MEQ tablet Take 1 tablet (20 mEq total) by mouth daily.  . quinapril (ACCUPRIL) 20 MG tablet TAKE 1 TABLET BY MOUTH AT  BEDTIME (Patient taking differently: Take 20 mg by mouth at bedtime. )  . spironolactone (ALDACTONE) 25 MG tablet Take 25 mg by mouth daily.  Marland Kitchen SYNTHROID 100 MCG tablet TAKE 1 TABLET BY MOUTH  DAILY BEFORE BREAKFAST  . vitamin B-12 (CYANOCOBALAMIN) 1000 MCG tablet Take 1,000 mcg by mouth daily.    Marland Kitchen warfarin (COUMADIN) 5 MG tablet TAKE 1 TO 1.5 TABLETS BY  MOUTH DAILY AS DIRECTED BY  COUMDAIN CLINIC (Patient taking differently: Take 2.5-5 mg by mouth See admin instructions. Take 1/2 tablet on Monday then take 1 tablet all the other days)  . [DISCONTINUED] cephALEXin (KEFLEX) 500 MG capsule Take 1 capsule (500 mg total) by mouth 2 (two) times daily.     Allergies:   Codeine   Social History   Tobacco Use  . Smoking status: Never Smoker  . Smokeless  tobacco: Never Used  Substance Use Topics  . Alcohol use: Yes    Alcohol/week: 0.0 standard drinks    Comment: occ  . Drug use: No     Family Hx: The patient's family history includes Cancer in her brother and mother; Heart disease in her sister; Heart failure in her father; Stroke in her sister.  ROS:   Please see the history of present illness.     All other systems reviewed and are negative.   Prior CV studies:   The following studies were reviewed today:  Echo  02/12/19 Cath 02/12/19  Labs/Other Tests and Data Reviewed:    EKG:   afib nonspecific ST changes lateral T wave inversion   Recent Labs: 02/12/2019: Magnesium 2.1 02/13/2019: Hemoglobin 11.5; Platelets 172 04/13/2019: BUN 23; Creatinine, Ser 1.13; Potassium 4.6; Sodium 135   Recent Lipid Panel Lab Results  Component Value Date/Time   CHOL 100 02/13/2019 03:09 AM   TRIG 73 02/13/2019 03:09 AM   HDL 37 (L) 02/13/2019 03:09 AM   CHOLHDL 2.7 02/13/2019 03:09 AM   LDLCALC 48 02/13/2019 03:09 AM    Wt Readings from Last 3 Encounters:  07/27/19 170 lb 3.2 oz (77.2 kg)  04/03/19 173 lb (78.5 kg)  02/13/19 172 lb 14.4 oz (78.4 kg)     Objective:    Vital Signs:  BP 123/66   Pulse (!) 57   Temp (!) 96.3 F (35.7 C)   Ht 5\' 11"  (1.803 m)   Wt 170 lb 3.2 oz (77.2 kg)   BMI 23.74 kg/m    No exam phone visit   ASSESSMENT & PLAN:    1. Chest Pain:  Troponin max around 13 with lateral T wave inversions however cath 02/12/19 with no epicardial CAD. May have been embolic event as she is in chronic afib but INR was 2.1 on admission. TTE with normal EF and no evidence of RWMA or Takatsubo. Given age don't think MRI to assess for myocarditis in order 2. Afib: chronic good rate control INR 2.3 a month ago and she has been on stable dose for years 3. HTN: Well controlled.  Continue current medications and low sodium Dash type diet.   4. HLD  On statin labs with primary   COVID-19 Education: The signs and symptoms of  COVID-19 were discussed with the patient and how to seek care for testing (follow up with PCP or arrange E-visit).  The importance of social distancing was discussed today.  Time:   Today, I have spent 30 minutes with the patient with telehealth technology discussing the above problems.     Medication Adjustments/Labs and Tests Ordered: Current medicines are reviewed at length with the patient today.  Concerns regarding medicines are outlined above.   Tests Ordered: No orders of the defined types were placed in this encounter.   Medication Changes: No orders of the defined types were placed in this encounter.   Disposition:  F/U in 6 months   Signed, Jenkins Rouge, MD  07/27/2019 9:07 AM    [place]

## 2019-07-24 ENCOUNTER — Telehealth: Payer: Self-pay

## 2019-07-24 NOTE — Telephone Encounter (Signed)
Pt is agreeable to switching to virtual with Dr. Johnsie Cancel on Monday, August 31st. A tele-consent has been done. Meds have been reviewed.

## 2019-07-24 NOTE — Telephone Encounter (Signed)
Meds reviewed 

## 2019-07-26 ENCOUNTER — Other Ambulatory Visit: Payer: Self-pay | Admitting: Cardiovascular Disease

## 2019-07-27 ENCOUNTER — Telehealth (INDEPENDENT_AMBULATORY_CARE_PROVIDER_SITE_OTHER): Payer: Medicare Other | Admitting: Cardiovascular Disease

## 2019-07-27 ENCOUNTER — Ambulatory Visit (INDEPENDENT_AMBULATORY_CARE_PROVIDER_SITE_OTHER): Payer: Medicare Other

## 2019-07-27 ENCOUNTER — Other Ambulatory Visit: Payer: Self-pay

## 2019-07-27 ENCOUNTER — Encounter: Payer: Self-pay | Admitting: Cardiovascular Disease

## 2019-07-27 VITALS — BP 123/66 | HR 57 | Temp 96.3°F | Ht 71.0 in | Wt 170.2 lb

## 2019-07-27 DIAGNOSIS — I482 Chronic atrial fibrillation, unspecified: Secondary | ICD-10-CM

## 2019-07-27 DIAGNOSIS — I2510 Atherosclerotic heart disease of native coronary artery without angina pectoris: Secondary | ICD-10-CM

## 2019-07-27 DIAGNOSIS — Z7901 Long term (current) use of anticoagulants: Secondary | ICD-10-CM

## 2019-07-27 LAB — POCT INR: INR: 3.4 — AB (ref 2.0–3.0)

## 2019-07-27 NOTE — Patient Instructions (Signed)
Medication Instructions:  Your physician recommends that you continue on your current medications as directed. Please refer to the Current Medication list given to you today.  If you need a refill on your cardiac medications before your next appointment, please call your pharmacy.   Lab work: None Ordered   Testing/Procedures: None Ordered   Follow-Up: At [facility], you and your health needs are our priority.  As part of our continuing mission to provide you with exceptional heart care, we have created designated Provider Care Teams.  These Care Teams include your primary Cardiologist (physician) and Advanced Practice Providers (APPs -  Physician Assistants and Nurse Practitioners) who all work together to provide you with the care you need, when you need it. You will need a follow up appointment in 6 months.  Please call our office 2 months in advance to schedule this appointment.  You may see Peter Nishan, MD or one of the following Advanced Practice Providers on your designated Care Team:   Lori Gerhardt, NP Laura Ingold, NP . Jill McDaniel, NP    

## 2019-07-27 NOTE — Patient Instructions (Addendum)
Description   Do not take your warfarin tonight. Then starting tomorrow, continue on same dosage 1 tablet daily except for 1/2 tablet on Mondays. Recheck in 4 weeks. Call our office if you have any concerns or if you are put on any new medications [phone]

## 2019-08-24 ENCOUNTER — Other Ambulatory Visit: Payer: Self-pay

## 2019-08-24 ENCOUNTER — Ambulatory Visit (INDEPENDENT_AMBULATORY_CARE_PROVIDER_SITE_OTHER): Payer: Medicare Other

## 2019-08-24 DIAGNOSIS — Z7901 Long term (current) use of anticoagulants: Secondary | ICD-10-CM | POA: Diagnosis not present

## 2019-08-24 DIAGNOSIS — I482 Chronic atrial fibrillation, unspecified: Secondary | ICD-10-CM

## 2019-08-24 LAB — POCT INR: INR: 2.4 (ref 2.0–3.0)

## 2019-08-24 NOTE — Patient Instructions (Signed)
Description   Continue on same dosage 1 tablet daily except for 1/2 tablet on Mondays. Recheck in 6 weeks. Call our office if you have any concerns or if you are put on any new medications [phone]

## 2019-08-30 ENCOUNTER — Other Ambulatory Visit: Payer: Self-pay | Admitting: Cardiovascular Disease

## 2019-09-03 ENCOUNTER — Encounter: Payer: Self-pay | Admitting: Family Medicine

## 2019-09-03 ENCOUNTER — Other Ambulatory Visit: Payer: Self-pay

## 2019-09-03 ENCOUNTER — Telehealth (INDEPENDENT_AMBULATORY_CARE_PROVIDER_SITE_OTHER): Payer: Medicare Other | Admitting: Family Medicine

## 2019-09-03 VITALS — BP 115/79 | HR 70 | Temp 96.6°F | Wt 171.0 lb

## 2019-09-03 DIAGNOSIS — I2510 Atherosclerotic heart disease of native coronary artery without angina pectoris: Secondary | ICD-10-CM | POA: Diagnosis not present

## 2019-09-03 DIAGNOSIS — N390 Urinary tract infection, site not specified: Secondary | ICD-10-CM

## 2019-09-03 MED ORDER — CEPHALEXIN 500 MG PO CAPS: 500.0000 mg | ORAL_CAPSULE | Freq: Three times a day (TID) | ORAL | 0 refills | Status: DC

## 2019-09-03 MED ORDER — PHENAZOPYRIDINE HCL 200 MG PO TABS: 200.0000 mg | ORAL_TABLET | Freq: Three times a day (TID) | ORAL | 0 refills | Status: DC | PRN

## 2019-09-03 NOTE — Progress Notes (Signed)
Virtual Visit via Telephone Note  I connected with the patient on 09/03/19 at 10:30 AM EDT by telephone and verified that I am speaking with the correct person using two identifiers. We attempted to connect virtually but we had technical difficulties with the audio and video.     I discussed the limitations, risks, security and privacy concerns of performing an evaluation and management service by telephone and the availability of in person appointments. I also discussed with the patient that there may be a patient responsible charge related to this service. The patient expressed understanding and agreed to proceed.  Location patient: home Location provider: work or home office Participants present for the call: patient, provider Patient did not have a visit in the prior 7 days to address this/these issue(s).   History of Present Illness: Here for 2 days of urinary urgency and burning. No fever or nausea or back pain. Drinking plenty of water.    Observations/Objective: Patient sounds cheerful and well on the phone. I do not appreciate any SOB. Speech and thought processing are grossly intact. Patient reported vitals:  Assessment and Plan: UTI, treat with Keflex. Add Pyridium as needed.  Alysia Penna, MD   Follow Up Instructions:     [phone] 11-20 [phone] 21-30 I did not refer this patient for an OV in the next 24 hours for this/these issue(s).  I discussed the assessment and treatment plan with the patient. The patient was provided an opportunity to ask questions and all were answered. The patient agreed with the plan and demonstrated an understanding of the instructions.   The patient was advised to call back or seek an in-person evaluation if the symptoms worsen or if the condition fails to improve as anticipated.  I provided 11 minutes of non-face-to-face time during this encounter.   Alysia Penna, MD

## 2019-09-04 DIAGNOSIS — Z1159 Encounter for screening for other viral diseases: Secondary | ICD-10-CM | POA: Diagnosis not present

## 2019-09-07 ENCOUNTER — Other Ambulatory Visit: Payer: Self-pay | Admitting: Cardiovascular Disease

## 2019-09-10 ENCOUNTER — Ambulatory Visit (INDEPENDENT_AMBULATORY_CARE_PROVIDER_SITE_OTHER): Payer: Medicare Other | Admitting: Family Medicine

## 2019-09-10 ENCOUNTER — Other Ambulatory Visit: Payer: Self-pay

## 2019-09-10 ENCOUNTER — Encounter: Payer: Self-pay | Admitting: Family Medicine

## 2019-09-10 VITALS — BP 142/100 | HR 87 | Temp 97.8°F | Ht 71.0 in | Wt 176.1 lb

## 2019-09-10 DIAGNOSIS — R17 Unspecified jaundice: Secondary | ICD-10-CM | POA: Diagnosis not present

## 2019-09-10 DIAGNOSIS — R1032 Left lower quadrant pain: Secondary | ICD-10-CM

## 2019-09-10 DIAGNOSIS — E039 Hypothyroidism, unspecified: Secondary | ICD-10-CM | POA: Diagnosis not present

## 2019-09-10 DIAGNOSIS — E875 Hyperkalemia: Secondary | ICD-10-CM | POA: Diagnosis not present

## 2019-09-10 DIAGNOSIS — I10 Essential (primary) hypertension: Secondary | ICD-10-CM | POA: Diagnosis not present

## 2019-09-10 DIAGNOSIS — I2510 Atherosclerotic heart disease of native coronary artery without angina pectoris: Secondary | ICD-10-CM | POA: Diagnosis not present

## 2019-09-10 DIAGNOSIS — N390 Urinary tract infection, site not specified: Secondary | ICD-10-CM | POA: Diagnosis not present

## 2019-09-10 DIAGNOSIS — E538 Deficiency of other specified B group vitamins: Secondary | ICD-10-CM

## 2019-09-10 LAB — CBC WITH DIFFERENTIAL/PLATELET
Basophils Absolute: 0.1 10*3/uL (ref 0.0–0.1)
Basophils Relative: 1 % (ref 0.0–3.0)
Eosinophils Absolute: 0.3 10*3/uL (ref 0.0–0.7)
Eosinophils Relative: 2.9 % (ref 0.0–5.0)
HCT: 39.6 % (ref 36.0–46.0)
Hemoglobin: 13.3 g/dL (ref 12.0–15.0)
Lymphocytes Relative: 15.2 % (ref 12.0–46.0)
Lymphs Abs: 1.3 10*3/uL (ref 0.7–4.0)
MCHC: 33.5 g/dL (ref 30.0–36.0)
MCV: 96.3 fl (ref 78.0–100.0)
Monocytes Absolute: 0.7 10*3/uL (ref 0.1–1.0)
Monocytes Relative: 7.5 % (ref 3.0–12.0)
Neutro Abs: 6.3 10*3/uL (ref 1.4–7.7)
Neutrophils Relative %: 73.4 % (ref 43.0–77.0)
Platelets: 264 10*3/uL (ref 150.0–400.0)
RBC: 4.11 Mil/uL (ref 3.87–5.11)
RDW: 13.3 % (ref 11.5–15.5)
WBC: 8.6 10*3/uL (ref 4.0–10.5)

## 2019-09-10 LAB — POCT URINALYSIS DIPSTICK
Bilirubin, UA: NEGATIVE
Blood, UA: 25
Glucose, UA: NEGATIVE
Ketones, UA: NEGATIVE
Protein, UA: NEGATIVE
Spec Grav, UA: 1.015 (ref 1.010–1.025)
Urobilinogen, UA: 1 E.U./dL
pH, UA: 6.5 (ref 5.0–8.0)

## 2019-09-10 LAB — BASIC METABOLIC PANEL
BUN: 15 mg/dL (ref 6–23)
CO2: 26 mEq/L (ref 19–32)
Calcium: 10.1 mg/dL (ref 8.4–10.5)
Chloride: 99 mEq/L (ref 96–112)
Creatinine, Ser: 0.91 mg/dL (ref 0.40–1.20)
GFR: 58.28 mL/min — ABNORMAL LOW (ref >60.00)
Glucose, Bld: 98 mg/dL (ref 70–99)
Potassium: 5.2 mEq/L — ABNORMAL HIGH (ref 3.5–5.1)
Sodium: 134 mEq/L — ABNORMAL LOW (ref 135–145)

## 2019-09-10 LAB — HEPATIC FUNCTION PANEL
ALT: 13 U/L (ref 0–35)
AST: 19 U/L (ref 0–37)
Albumin: 4.8 g/dL (ref 3.5–5.2)
Alkaline Phosphatase: 103 U/L (ref 39–117)
Bilirubin, Direct: 0.4 mg/dL — ABNORMAL HIGH (ref 0.0–0.3)
Total Bilirubin: 1.8 mg/dL — ABNORMAL HIGH (ref 0.2–1.2)
Total Protein: 7.5 g/dL (ref 6.0–8.3)

## 2019-09-10 LAB — VITAMIN B12: Vitamin B-12: 797 pg/mL (ref 211–911)

## 2019-09-10 LAB — T4, FREE: Free T4: 1.14 ng/dL (ref 0.60–1.60)

## 2019-09-10 LAB — TSH: TSH: 0.79 u[IU]/mL (ref 0.35–4.50)

## 2019-09-10 LAB — T3, FREE: T3, Free: 2.4 pg/mL (ref 2.3–4.2)

## 2019-09-10 MED ORDER — CARVEDILOL 12.5 MG PO TABS: 12.5000 mg | ORAL_TABLET | Freq: Two times a day (BID) | ORAL | 3 refills | Status: DC

## 2019-09-10 MED ORDER — CIPROFLOXACIN HCL 500 MG PO TABS: 500.0000 mg | ORAL_TABLET | Freq: Two times a day (BID) | ORAL | 0 refills | Status: DC

## 2019-09-10 MED ORDER — FUROSEMIDE 40 MG PO TABS: 40.0000 mg | ORAL_TABLET | Freq: Every day | ORAL | 2 refills | Status: DC

## 2019-09-10 NOTE — Progress Notes (Signed)
   Subjective:    Patient ID: [name], female    DOB: November 05, 1929, 85 y.o.   MRN: [id number]  HPI Here to recheck a UTI and for other symptoms. She is finishing a course of Keflex but she stil has some burning on urination. No fever. She also describes 3 weeks of not feeling well in general. She cannot be any more specific in her description. No chest pain or SOB, no abdominal pain. BMs are regular. Appetite is good.    Review of Systems  Constitutional: Negative.   Respiratory: Negative.   Cardiovascular: Negative.   Gastrointestinal: Negative.   Genitourinary: Positive for dysuria. Negative for hematuria, pelvic pain and urgency.  Neurological: Negative.        Objective:   Physical Exam Constitutional:      Appearance: Normal appearance. She is not ill-appearing.  Cardiovascular:     Rate and Rhythm: Normal rate. Rhythm irregular.     Pulses: Normal pulses.     Comments: 3/6 SM as usual  Pulmonary:     Effort: Pulmonary effort is normal.     Breath sounds: Normal breath sounds.  Abdominal:     General: Abdomen is flat. Bowel sounds are normal. There is no distension.     Palpations: Abdomen is soft. There is no mass.     Tenderness: There is no abdominal tenderness. There is no guarding or rebound.     Hernia: No hernia is present.  Neurological:     General: No focal deficit present.     Mental Status: She is alert and oriented to person, place, and time.           Assessment & Plan:  She istill has a UTI so we will culture the sample today. Treat with Cipro 500 mg bid for 10 days. Her BP is high today so we will increase the Carvedilol to 12.5 mg bid. Get labs to check potassium, B12, etc. She is drinking plenty of water.  Alysia Penna, MD

## 2019-09-11 ENCOUNTER — Telehealth: Payer: Self-pay | Admitting: *Deleted

## 2019-09-11 LAB — URINE CULTURE
MICRO NUMBER:: 993868
Result:: NO GROWTH
SPECIMEN QUALITY:: ADEQUATE

## 2019-09-11 NOTE — Telephone Encounter (Signed)
Pt called and stated that she started taking Cipro on  10/15. Instructed pt to eat a extra serving of greens over the weekend and scheduled her an appointment [facility] on 09[facility]14/2019.

## 2019-09-14 ENCOUNTER — Ambulatory Visit (INDEPENDENT_AMBULATORY_CARE_PROVIDER_SITE_OTHER): Payer: Medicare Other | Admitting: Pharmacist

## 2019-09-14 ENCOUNTER — Other Ambulatory Visit: Payer: Self-pay

## 2019-09-14 DIAGNOSIS — Z7901 Long term (current) use of anticoagulants: Secondary | ICD-10-CM | POA: Diagnosis not present

## 2019-09-14 DIAGNOSIS — I482 Chronic atrial fibrillation, unspecified: Secondary | ICD-10-CM

## 2019-09-14 LAB — POCT INR: INR: 2.6 (ref 2.0–3.0)

## 2019-09-14 NOTE — Patient Instructions (Addendum)
Description   Continue eating increased amounts of greens (vitamin K) while taking ciprofloxacin. Continue on same dosage 1 tablet daily except for 1/2 tablet on Mondays. Recheck in 6 weeks. Call our office if you have any concerns or if you are put on any new medications [phone]

## 2019-09-15 NOTE — Addendum Note (Signed)
Addended by: Gwenyth Ober R on: 09/15/2019 03:46 PM   Modules accepted: Orders

## 2019-09-18 ENCOUNTER — Emergency Department (HOSPITAL_BASED_OUTPATIENT_CLINIC_OR_DEPARTMENT_OTHER)
Admission: EM | Admit: 2019-09-18 | Discharge: 2019-09-18 | Disposition: A | Discharge status: Home / Self Care | Payer: Medicare Other | Attending: Emergency Medicine | Admitting: Emergency Medicine

## 2019-09-18 ENCOUNTER — Other Ambulatory Visit: Payer: Self-pay

## 2019-09-18 ENCOUNTER — Emergency Department (HOSPITAL_BASED_OUTPATIENT_CLINIC_OR_DEPARTMENT_OTHER): Payer: Medicare Other

## 2019-09-18 ENCOUNTER — Encounter (HOSPITAL_BASED_OUTPATIENT_CLINIC_OR_DEPARTMENT_OTHER): Payer: Self-pay | Admitting: Emergency Medicine

## 2019-09-18 DIAGNOSIS — Z79899 Other long term (current) drug therapy: Secondary | ICD-10-CM | POA: Diagnosis not present

## 2019-09-18 DIAGNOSIS — R110 Nausea: Secondary | ICD-10-CM | POA: Diagnosis not present

## 2019-09-18 DIAGNOSIS — I252 Old myocardial infarction: Secondary | ICD-10-CM | POA: Insufficient documentation

## 2019-09-18 DIAGNOSIS — E039 Hypothyroidism, unspecified: Secondary | ICD-10-CM | POA: Diagnosis not present

## 2019-09-18 DIAGNOSIS — E871 Hypo-osmolality and hyponatremia: Secondary | ICD-10-CM | POA: Diagnosis not present

## 2019-09-18 DIAGNOSIS — Z8541 Personal history of malignant neoplasm of cervix uteri: Secondary | ICD-10-CM | POA: Insufficient documentation

## 2019-09-18 DIAGNOSIS — Z7901 Long term (current) use of anticoagulants: Secondary | ICD-10-CM | POA: Diagnosis not present

## 2019-09-18 DIAGNOSIS — I110 Hypertensive heart disease with heart failure: Secondary | ICD-10-CM | POA: Insufficient documentation

## 2019-09-18 DIAGNOSIS — I4891 Unspecified atrial fibrillation: Secondary | ICD-10-CM | POA: Insufficient documentation

## 2019-09-18 DIAGNOSIS — E785 Hyperlipidemia, unspecified: Secondary | ICD-10-CM | POA: Insufficient documentation

## 2019-09-18 DIAGNOSIS — I5031 Acute diastolic (congestive) heart failure: Secondary | ICD-10-CM | POA: Insufficient documentation

## 2019-09-18 DIAGNOSIS — Z885 Allergy status to narcotic agent status: Secondary | ICD-10-CM | POA: Diagnosis not present

## 2019-09-18 DIAGNOSIS — Z85038 Personal history of other malignant neoplasm of large intestine: Secondary | ICD-10-CM | POA: Insufficient documentation

## 2019-09-18 LAB — CBC WITH DIFFERENTIAL/PLATELET
Abs Immature Granulocytes: 0.03 10*3/uL (ref 0.00–0.07)
Basophils Absolute: 0.1 10*3/uL (ref 0.0–0.1)
Basophils Relative: 1 %
Eosinophils Absolute: 0.1 10*3/uL (ref 0.0–0.5)
Eosinophils Relative: 2 %
HCT: 35.7 % — ABNORMAL LOW (ref 36.0–46.0)
Hemoglobin: 11.9 g/dL — ABNORMAL LOW (ref 12.0–15.0)
Immature Granulocytes: 0 %
Lymphocytes Relative: 13 %
Lymphs Abs: 1 10*3/uL (ref 0.7–4.0)
MCH: 32.3 pg (ref 26.0–34.0)
MCHC: 33.3 g/dL (ref 30.0–36.0)
MCV: 97 fL (ref 80.0–100.0)
Monocytes Absolute: 0.6 10*3/uL (ref 0.1–1.0)
Monocytes Relative: 8 %
Neutro Abs: 5.7 10*3/uL (ref 1.7–7.7)
Neutrophils Relative %: 76 %
Platelets: 190 10*3/uL (ref 150–400)
RBC: 3.68 MIL/uL — ABNORMAL LOW (ref 3.87–5.11)
RDW: 12.3 % (ref 11.5–15.5)
WBC: 7.5 10*3/uL (ref 4.0–10.5)
nRBC: 0 % (ref 0.0–0.2)

## 2019-09-18 LAB — URINALYSIS, ROUTINE W REFLEX MICROSCOPIC
Bilirubin Urine: NEGATIVE
Glucose, UA: NEGATIVE mg/dL
Ketones, ur: NEGATIVE mg/dL
Leukocytes,Ua: NEGATIVE
Nitrite: NEGATIVE
Protein, ur: NEGATIVE mg/dL
Specific Gravity, Urine: 1.01 (ref 1.005–1.030)
pH: 6.5 (ref 5.0–8.0)

## 2019-09-18 LAB — LIPASE, BLOOD: Lipase: 37 U/L (ref 11–51)

## 2019-09-18 LAB — PROTIME-INR
INR: 2 — ABNORMAL HIGH (ref 0.8–1.2)
Prothrombin Time: 22.8 seconds — ABNORMAL HIGH (ref 11.4–15.2)

## 2019-09-18 LAB — COMPREHENSIVE METABOLIC PANEL
ALT: 16 U/L (ref 0–44)
AST: 23 U/L (ref 15–41)
Albumin: 4.3 g/dL (ref 3.5–5.0)
Alkaline Phosphatase: 77 U/L (ref 38–126)
Anion gap: 12 (ref 5–15)
BUN: 16 mg/dL (ref 8–23)
CO2: 21 mmol/L — ABNORMAL LOW (ref 22–32)
Calcium: 9.4 mg/dL (ref 8.9–10.3)
Chloride: 97 mmol/L — ABNORMAL LOW (ref 98–111)
Creatinine, Ser: 0.89 mg/dL (ref 0.44–1.00)
GFR calc Af Amer: >60 mL/min (ref >60)
GFR calc non Af Amer: 58 mL/min — ABNORMAL LOW (ref >60)
Glucose, Bld: 121 mg/dL — ABNORMAL HIGH (ref 70–99)
Potassium: 4.1 mmol/L (ref 3.5–5.1)
Sodium: 130 mmol/L — ABNORMAL LOW (ref 135–145)
Total Bilirubin: 2.1 mg/dL — ABNORMAL HIGH (ref 0.3–1.2)
Total Protein: 6.8 g/dL (ref 6.5–8.1)

## 2019-09-18 LAB — URINALYSIS, MICROSCOPIC (REFLEX)

## 2019-09-18 MED ORDER — ONDANSETRON 4 MG PO TBDP: 4.0000 mg | ORAL_TABLET | Freq: Three times a day (TID) | ORAL | 0 refills | Status: DC | PRN

## 2019-09-18 MED ORDER — ONDANSETRON HCL 8 MG PO TABS
4.0000 mg | ORAL_TABLET | Freq: Once | ORAL | Status: AC
  Administered 2019-09-18: 4 mg via ORAL
  Filled 2019-09-18: qty 1

## 2019-09-18 NOTE — ED Notes (Signed)
Pt on montior

## 2019-09-18 NOTE — ED Notes (Signed)
U/S complete at bedside 

## 2019-09-18 NOTE — Discharge Instructions (Signed)
Mildly restrict your water intake. Increase salt in diet. Soups, broths, and soy sauces contain a lot of salt.

## 2019-09-18 NOTE — ED Triage Notes (Addendum)
Pt sts she woke up with nausea today; she has is finishing tx for UTI; feels like heart is fluttering a lot; discomfort in lower LT rib cage area intermittently x 3-4 wks

## 2019-09-18 NOTE — ED Provider Notes (Signed)
[facility] EMERGENCY DEPARTMENT Provider Note   CSN: [id number] Arrival date & time: 09/18/19  0908     History   Chief Complaint Chief Complaint  Patient presents with  . Nausea    HPI [name] is a 85 y.o. female.     85yo F w/ PMH including A fib on coumadin, HPpEF, distant h/o colon and uterine cancer, PVC, splenic infarction, HTN, HLD who p/w nausea. PT reports several weeks of occasional nausea that has gotten worse over the past week. ~9 days ago, she was switched from 1 antibiotic to cipro for persistent UTI. She finishes this antibiotic tomorrow; she states she has tolerated cipro fine in the past.  She denies any actual vomiting and states that her nausea occurs randomly and is not associated with eating.  She denies any associated abdominal pain.  No diarrhea, constipation, bloody stools, black stools, urinary symptoms, fevers, or sick contacts.  She has been feeling some heart fluttering over the past week but no associated chest pain.  She is compliant with Coumadin.  She denies any shortness of breath.  She has noted some occasional pain in her left upper quadrant that is not associated with eating.  The history is provided by the patient.    Past Medical History:  Diagnosis Date  . Anxiety   . Atrial fibrillation ([place])   . Cancer ([place])   . Cataract    REMOVED BILATERAL  . Chest pain, unspecified   . Chronic low back pain   . Diverticulosis of colon   . History of colon cancer   . History of colonic polyps   . History of colonoscopy   . History of uterine cancer   . Hyperlipidemia   . Hypertension   . Hypothyroid   . Impaired glucose tolerance   . Mitral valve disorder   . Osteoarthritis   . Osteoporosis   . Patent foramen ovale   . Peripheral neuropathy   . Peripheral vascular disease ([place])   . Splenic infarction   . UTI (lower urinary tract infection)   . Venous insufficiency   . Vitamin B12 deficiency     Patient Active Problem  List   Diagnosis Date Noted  . NSTEMI (non-ST elevated myocardial infarction) ([place]) 02/12/2019  . Chest pain of uncertain etiology [id number]  . Sore throat 10/30/2018  . Fatigue 10/30/2018  . Dysuria 10/30/2018  . Hypoxemia 01/16/2018  . Lower respiratory infection 01/16/2018  . History of colon cancer in adulthood 01/16/2018  . Diastolic congestive heart failure with preserved left ventricular function, NYHA class 2 ([place]) 01/16/2018  . SBO (small bowel obstruction) ([place]) 04/02/2017  . CAP (community acquired pneumonia) 02/24/2016  . Fever blister 02/24/2016  . Bilateral pneumonia 02/17/2016  . Acute respiratory failure with hypercapnia ([place]) 02/17/2016  . Acute diastolic heart failure ([place])   . Dehydration 12/15/2015  . Sepsis ([place]) 12/15/2015  . Enteritis 12/15/2015  . Diarrhea   . Charcot's joint of left foot 11/24/2015  . Vasculitis ([place]) 03/11/2015  . Aortic stenosis 09/14/2013  . Long term current use of anticoagulant 01/09/2011  . COLONIC POLYPS 10/29/2010  . DIVERTICULOSIS OF COLON 10/29/2010  . ABNORMAL CV (STRESS) TEST 04/20/2010  . CHEST PAIN UNSPECIFIED 04/17/2010  . B12 deficiency 12/23/2008  . IMPAIRED GLUCOSE TOLERANCE 12/23/2008  . PERIPHERAL NEUROPATHY 06/22/2008  . SPLENIC INFARCTION 02/20/2008  . Anxiety state 02/20/2008  . Moderate mitral regurgitation 02/20/2008  . PERIPHERAL VASCULAR DISEASE 02/20/2008  . Venous (peripheral) insufficiency 02/20/2008  .  URINARY TRACT INFECTION 02/20/2008  . Osteoarthritis 02/20/2008  . OSTEOPOROSIS 02/20/2008  . UTERINE CANCER, HX OF 02/20/2008  . Malignant neoplasm of colon ([place]) 01/13/2008  . Hypothyroidism 01/13/2008  . Hyperlipemia 01/13/2008  . Essential hypertension 01/13/2008  . ATRIAL FIBRILLATION 01/13/2008  . LOW BACK PAIN, CHRONIC 01/13/2008    Past Surgical History:  Procedure Laterality Date  . ANTERIOR AND POSTERIOR VAGINAL REPAIR  03/2006   Dr.Neal  . basal cell skin cancer Moh's surgery    .  CATARACT EXTRACTION, BILATERAL    . COLONOSCOPY  05-25-10   per Dr. Henrene Pastor, benign polyps, repeat in 5 yrs   . EYE SURGERY    . hysterectomy for uterine cancer    . left carpal tunnel surgery  02/2007   Dr. Daylene Katayama  . LEFT HEART CATH AND CORONARY ANGIOGRAPHY N/A 02/12/2019   Procedure: LEFT HEART CATH AND CORONARY ANGIOGRAPHY;  Surgeon: Lorretta Harp, MD;  Location: [facility];  Service: Cardiovascular;  Laterality: N/A;  . lumbar laminectomy for spinal stenosis  2006   L3-4  . RETINAL DETACHMENT REPAIR W/ SCLERAL BUCKLE LE Right 03-01-14   per Dr. Zadie Rhine   . right hemicolectomy for colon cancer  1994     OB History   No obstetric history on file.      Home Medications    Prior to Admission medications   Medication Sig Start Date End Date Taking? Authorizing Provider  acetaminophen (TYLENOL) 500 MG tablet Take 2 tablets (1,000 mg total) by mouth 2 (two) times daily as needed. 04/30/18   Laurey Morale, MD  ALPRAZolam Duanne Moron) 0.25 MG tablet TAKE 1 TABLET BY MOUTH AT BEDTIME AS NEEDED 05/27/19   Laurey Morale, MD  atorvastatin (LIPITOR) 20 MG tablet TAKE 1 TABLET BY MOUTH  DAILY 07/28/19   Josue Hector, MD  Calcium Carbonate-Vitamin D (CALTRATE 600+D) 600-400 MG-UNIT per tablet Take 1 tablet by mouth daily.      [provider]  carvedilol (COREG) 12.5 MG tablet Take 1 tablet (12.5 mg total) by mouth 2 (two) times daily with a meal. 09/10/19   Laurey Morale, MD  cetirizine (ZYRTEC) 10 MG tablet Take 1 tablet (10 mg total) by mouth daily. 10/30/18   Hoyt Koch, MD  ciprofloxacin (CIPRO) 500 MG tablet Take 1 tablet (500 mg total) by mouth 2 (two) times daily. 09/10/19   Laurey Morale, MD  CLIMARA 0.1 MG/24HR patch Use as directed 03/23/19   [provider]  furosemide (LASIX) 40 MG tablet Take 1 tablet (40 mg total) by mouth daily. 09/10/19   Laurey Morale, MD  ketorolac (ACULAR) 0.5 % ophthalmic solution Place 1 drop into the right eye 2 (two) times  daily.  03/17/15   [provider]  Multiple Vitamins-Minerals (CENTRUM SILVER PO) Take 1 tablet by mouth daily.      [provider]  nitroGLYCERIN (NITROSTAT) 0.4 MG SL tablet Place 1 tablet (0.4 mg total) under the tongue every 5 (five) minutes as needed for chest pain. 02/13/19   Tommie Raymond, NP  ondansetron (ZOFRAN ODT) 4 MG disintegrating tablet Take 1 tablet (4 mg total) by mouth every 8 (eight) hours as needed for nausea or vomiting. 09/18/19   Aizah Gehlhausen, Wenda Overland, MD  phenazopyridine (PYRIDIUM) 200 MG tablet Take 1 tablet (200 mg total) by mouth 3 (three) times daily as needed (urinary burning). 09/03/19   Laurey Morale, MD  potassium chloride SA (KLOR-CON) 20 MEQ tablet  Take 1 tablet (20 mEq total) by mouth daily. 09/08/19   Josue Hector, MD  quinapril (ACCUPRIL) 20 MG tablet TAKE 1 TABLET BY MOUTH AT  BEDTIME Patient taking differently: Take 20 mg by mouth at bedtime.  12/04/18   Josue Hector, MD  spironolactone (ALDACTONE) 25 MG tablet TAKE 1 TABLET BY MOUTH  DAILY 07/28/19   Josue Hector, MD  SYNTHROID 100 MCG tablet TAKE 1 TABLET BY MOUTH  DAILY BEFORE BREAKFAST 04/28/19   Laurey Morale, MD  vitamin B-12 (CYANOCOBALAMIN) 1000 MCG tablet Take 1,000 mcg by mouth daily.      [provider]  warfarin (COUMADIN) 5 MG tablet TAKE 1 TO 1.5 TABLETS BY  MOUTH DAILY AS DIRECTED BY  [place] Patient taking differently: Take 2.5-5 mg by mouth See admin instructions. Take 1/2 tablet on Monday then take 1 tablet all the other days 09/18/18   Josue Hector, MD    Family History Family History  Problem Relation Age of Onset  . Cancer Mother        deceased age 67  . Heart failure Father        deceased age 83  . Cancer Brother        deceased age 22  . Stroke Sister        and heart problems; deceased age 80  . Heart disease Sister     Social History Social History   Tobacco Use  . Smoking status: Never Smoker  . Smokeless tobacco: Never  Used  Substance Use Topics  . Alcohol use: Yes    Alcohol/week: 0.0 standard drinks    Comment: occ  . Drug use: No     Allergies   Codeine   Review of Systems Review of Systems All other systems reviewed and are negative except that which was mentioned in HPI   Physical Exam Updated Vital Signs BP (!) 145/89   Pulse 63   Temp 98.1 F (36.7 C) (Oral)   Resp 16   Ht 5\' 11"  (1.803 m)   Wt 78 kg   SpO2 98%   BMI 23.99 kg/m   Physical Exam Vitals signs and nursing note reviewed.  Constitutional:      General: She is not in acute distress.    Appearance: She is well-developed.  HENT:     Head: Normocephalic and atraumatic.  Eyes:     Conjunctiva/sclera: Conjunctivae normal.  Neck:     Musculoskeletal: Neck supple.  Cardiovascular:     Rate and Rhythm: Normal rate. Rhythm irregular.     Heart sounds: Murmur present.  Pulmonary:     Effort: Pulmonary effort is normal.     Breath sounds: Normal breath sounds.  Abdominal:     General: Bowel sounds are normal. There is no distension.     Palpations: Abdomen is soft.     Tenderness: There is abdominal tenderness (LUQ).  Musculoskeletal:     Right lower leg: No edema.     Left lower leg: No edema.  Skin:    General: Skin is warm and dry.  Neurological:     Mental Status: She is alert and oriented to person, place, and time.     Comments: Fluent speech  Psychiatric:        Judgment: Judgment normal.      ED Treatments / Results  Labs (all labs ordered are listed, but only abnormal results are displayed) Labs Reviewed  COMPREHENSIVE METABOLIC PANEL - Abnormal; Notable  for the following components:      Result Value   Sodium 130 (*)    Chloride 97 (*)    CO2 21 (*)    Glucose, Bld 121 (*)    Total Bilirubin 2.1 (*)    GFR calc non Af Amer 58 (*)    All other components within normal limits  CBC WITH DIFFERENTIAL/PLATELET - Abnormal; Notable for the following components:   RBC 3.68 (*)    Hemoglobin  11.9 (*)    HCT 35.7 (*)    All other components within normal limits  URINALYSIS, ROUTINE W REFLEX MICROSCOPIC - Abnormal; Notable for the following components:   Hgb urine dipstick SMALL (*)    All other components within normal limits  PROTIME-INR - Abnormal; Notable for the following components:   Prothrombin Time 22.8 (*)    INR 2.0 (*)    All other components within normal limits  URINALYSIS, MICROSCOPIC (REFLEX) - Abnormal; Notable for the following components:   Bacteria, UA MANY (*)    All other components within normal limits  LIPASE, BLOOD    EKG EKG Interpretation  Date/Time:  Friday September 18 2019 09:19:27 EDT Ventricular Rate:  66 PR Interval:    QRS Duration: 98 QT Interval:  421 QTC Calculation: 442 R Axis:   73 Text Interpretation:  Atrial fibrillation Probable left ventricular hypertrophy Nonspecific T abnrm, anterolateral leads Baseline wander in lead(s) V3 similar to previous Confirmed by Theotis Burrow [phone]) on 09/18/2019 9:34:56 AM   Radiology US Abdomen Limited  Result Date: 09/18/2019 CLINICAL DATA:  Nausea.  Elevated bilirubin. EXAM: ULTRASOUND ABDOMEN LIMITED RIGHT UPPER QUADRANT COMPARISON:  CT scan 04/02/2017 FINDINGS: Gallbladder: No gallstones or wall thickening visualized. No sonographic Murphy sign noted by sonographer. Common bile duct: Diameter: 4.1 mm Liver: Normal echogenicity without focal lesion or biliary dilatation. Portal vein is patent on color Doppler imaging with normal direction of blood flow towards the liver. Other: None. IMPRESSION: Normal right upper quadrant ultrasound examination. Electronically Signed   By: Marijo Sanes M.D.   On: 09/18/2019 11:58    Procedures Procedures (including critical care time)  Medications Ordered in ED Medications  ondansetron (ZOFRAN) tablet 4 mg (4 mg Oral Given 09/18/19 0953)     Initial Impression / Assessment and Plan / ED Course  I have reviewed the triage vital signs and the nursing  notes.  Pertinent labs & imaging results that were available during my care of the patient were reviewed by me and considered in my medical decision making (see chart for details).        Well-appearing on exam, hypertensive.  Mild left upper quadrant tenderness.  She denies any postprandial symptoms.  No chest pain, shortness of breath, or other symptoms to suggest ACS or other cardiac pathology.  UA negative for obvious infection. Na 130, K 4.1, normal AST/ALT, tbili 2.1. normal lipase. INR 2.0.  Because of persistence of nausea and slightly elevated bilirubin level, obtained right upper quadrant ultrasound which shows no abnormalities of gallbladder, liver, or CBD.  Patient remains well-appearing on reassessment, has had no episodes of vomiting.  I discussed her mild hyponatremia and treatment at home including mild fluid restriction and incorporation of salt in diet.  Provided with Zofran to use as needed for nausea and discussed supportive measures.  Encourage patient to follow-up with PCP next week for reassessment of symptoms as well as recheck of sodium.  I have extensively reviewed return precautions and patient and family member voiced  understanding.  Final Clinical Impressions(s) / ED Diagnoses   Final diagnoses:  Nausea  Hyponatremia    ED Discharge Orders         Ordered    ondansetron (ZOFRAN ODT) 4 MG disintegrating tablet  Every 8 hours PRN     09/18/19 1248           Kashif Pooler, Wenda Overland, MD 09/18/19 1256

## 2019-09-21 ENCOUNTER — Telehealth: Payer: Self-pay | Admitting: Cardiovascular Disease

## 2019-09-21 NOTE — Telephone Encounter (Signed)
Called patient back about her message. Patient stated she wanted to let Dr. Johnsie Cancel know that she had a UTI and saw Dr. Sarajane Jews. She stated that she had a few medications changes and she wanted his opinion of this. Patient's Coreg was increased to 12.5 mg BID and Potassium was decreased to 20 meq every other day. Patient stated her heart is fluttering more than normal and her lab work is abnormal (PCP is following). Informed patient that a message would be sent to Dr. Johnsie Cancel for advisement. Encouraged patient to keep taking medications as prescribed by her PCP.

## 2019-09-21 NOTE — Telephone Encounter (Signed)
Called patient back with Dr. Nishan's response. Patient verbalized understanding. 

## 2019-09-21 NOTE — Telephone Encounter (Signed)
Changes are fine

## 2019-09-21 NOTE — Telephone Encounter (Signed)
New message:     Patient calling stating that she has been having a UTI. She would like for some one to call her concering the medication that her primary care changed her medication. Please call patient back.

## 2019-09-24 ENCOUNTER — Telehealth: Payer: Self-pay

## 2019-09-24 DIAGNOSIS — E871 Hypo-osmolality and hyponatremia: Secondary | ICD-10-CM

## 2019-09-24 NOTE — Telephone Encounter (Signed)
Copied from [place] [id number]. Topic: Quick Communication - See Telephone Encounter >> Sep 21, 2019  8:57 AM [place] wrote: CRM for notification. See Telephone encounter for: 09/21/19. Pt states that not feeling well and went to urgent Care on Friday and her sodium was low. She has lab work scheduled for 11/9 and they told her to add this to her lab work up at that time . FU if needed at [phone]

## 2019-09-24 NOTE — Telephone Encounter (Signed)
We will look at the lab results and go from there

## 2019-09-24 NOTE — Telephone Encounter (Signed)
Noted  

## 2019-09-24 NOTE — Telephone Encounter (Signed)
LM for pt to return phone call. Sodium level low and was advised to f/u with another CMP lab order. Pt has a upcoming appt. 11/9. Lab order placed for future.

## 2019-09-30 DIAGNOSIS — Z1159 Encounter for screening for other viral diseases: Secondary | ICD-10-CM | POA: Diagnosis not present

## 2019-10-05 ENCOUNTER — Other Ambulatory Visit (INDEPENDENT_AMBULATORY_CARE_PROVIDER_SITE_OTHER): Payer: Medicare Other

## 2019-10-05 ENCOUNTER — Ambulatory Visit (INDEPENDENT_AMBULATORY_CARE_PROVIDER_SITE_OTHER): Payer: Medicare Other | Admitting: Pharmacist

## 2019-10-05 ENCOUNTER — Other Ambulatory Visit: Payer: Self-pay

## 2019-10-05 DIAGNOSIS — E871 Hypo-osmolality and hyponatremia: Secondary | ICD-10-CM | POA: Diagnosis not present

## 2019-10-05 DIAGNOSIS — I482 Chronic atrial fibrillation, unspecified: Secondary | ICD-10-CM

## 2019-10-05 DIAGNOSIS — Z7901 Long term (current) use of anticoagulants: Secondary | ICD-10-CM

## 2019-10-05 LAB — COMPREHENSIVE METABOLIC PANEL
ALT: 16 U/L (ref 0–35)
AST: 26 U/L (ref 0–37)
Albumin: 4.3 g/dL (ref 3.5–5.2)
Alkaline Phosphatase: 93 U/L (ref 39–117)
BUN: 27 mg/dL — ABNORMAL HIGH (ref 6–23)
CO2: 24 mEq/L (ref 19–32)
Calcium: 9.1 mg/dL (ref 8.4–10.5)
Chloride: 96 mEq/L (ref 96–112)
Creatinine, Ser: 1.15 mg/dL (ref 0.40–1.20)
GFR: 44.47 mL/min — ABNORMAL LOW (ref >60.00)
Glucose, Bld: 134 mg/dL — ABNORMAL HIGH (ref 70–99)
Potassium: 4.7 mEq/L (ref 3.5–5.1)
Sodium: 130 mEq/L — ABNORMAL LOW (ref 135–145)
Total Bilirubin: 1 mg/dL (ref 0.2–1.2)
Total Protein: 6.6 g/dL (ref 6.0–8.3)

## 2019-10-05 LAB — POCT INR: INR: 1.7 — AB (ref 2.0–3.0)

## 2019-10-05 NOTE — Patient Instructions (Signed)
Description   Go back to eating your regular amount of greens. Take 1 whole tablet today, then continue on same dosage 1 tablet daily except for 1/2 tablet on Mondays. Recheck in 4 weeks. Call our office if you have any concerns or if you are put on any new medications [phone]

## 2019-10-09 ENCOUNTER — Other Ambulatory Visit: Payer: Self-pay

## 2019-10-09 DIAGNOSIS — Z20822 Contact with and (suspected) exposure to covid-19: Secondary | ICD-10-CM

## 2019-10-09 DIAGNOSIS — Z20828 Contact with and (suspected) exposure to other viral communicable diseases: Secondary | ICD-10-CM | POA: Diagnosis not present

## 2019-10-12 LAB — NOVEL CORONAVIRUS, NAA: SARS-CoV-2, NAA: DETECTED — AB

## 2019-10-19 ENCOUNTER — Ambulatory Visit: Payer: Self-pay | Admitting: *Deleted

## 2019-10-19 DIAGNOSIS — U071 COVID-19: Secondary | ICD-10-CM

## 2019-10-19 NOTE — Telephone Encounter (Signed)
I put in the order for Covid antibodies, but it can take several weeks for the body to produce these. I suggest she wait to have the test drawn on 10-26-19.

## 2019-10-19 NOTE — Telephone Encounter (Signed)
Contacted pt due to her concerns; she  had a positive covid test on 10/09/2019; she would like to know timeline for getting an antibody test; the pt tested negative on  and having no symptoms; she would like on 09/04/2019 and 09/30/2019; she would antibody to confirm her status; the pt says that she needs an antibody test to confirm her status because she is going from one senior living to another, and needs a negative test; she was to have moved last week; explained to the pt that the antibody test confirms she did have COVID, and the likelihood of her having a false positive is low because the test looks for the presence of the organism that causes COVID; also informed pt If you had a COVID-19 test with a positive result (COVID detected) we recommend no further testing or retesting for 90 days from the date of the original test. There may be circumstance that require a retest prior to the end of the 90 day window. If you feel you need a COVID-19 retest please contact your primary care provide; she would like to discuss this with Dr Sarajane Jews, [facility] [place]; the pt can be contacted at [phone]; will route to office for final disposition.  Reason for Disposition . [1] Caller requesting NON-URGENT health information AND [2] PCP's office is the best resource  Answer Assessment - Initial Assessment Questions 1. REASON FOR CALL or QUESTION: "What is your reason for calling today?" or "How can I best help you?" or "What question do you have that I can help answer?"     covid antibody testing/negative test result  Protocols used: INFORMATION ONLY CALL - NO TRIAGE-A-AH

## 2019-10-19 NOTE — Telephone Encounter (Signed)
Dr. Fry please advise 

## 2019-10-19 NOTE — Addendum Note (Signed)
Addended by: Alysia Penna A on: 10/19/2019 04:42 PM   Modules accepted: Orders

## 2019-10-20 DIAGNOSIS — Z20828 Contact with and (suspected) exposure to other viral communicable diseases: Secondary | ICD-10-CM | POA: Diagnosis not present

## 2019-10-20 DIAGNOSIS — Z03818 Encounter for observation for suspected exposure to other biological agents ruled out: Secondary | ICD-10-CM | POA: Diagnosis not present

## 2019-10-20 NOTE — Telephone Encounter (Signed)
Spoke with patient. Informed her that Dr. Sarajane Jews put in the order for Covid antibodies, but it can take several weeks for the body to produce these. He suggest she wait to have the test drawn on 10-26-19.  Informed patient that she can't come into the office until it has been 2 weeks or more and has no symptoms. Patient verbalized understanding and will call back the first of December.

## 2019-10-26 NOTE — Telephone Encounter (Signed)
Noted  

## 2019-10-29 ENCOUNTER — Other Ambulatory Visit: Payer: Self-pay

## 2019-10-29 MED ORDER — WARFARIN SODIUM 5 MG PO TABS: ORAL_TABLET | ORAL | 1 refills | Status: DC

## 2019-10-29 NOTE — Telephone Encounter (Signed)
Warfarin refill received from [facility] vis fax, escribed rx.

## 2019-11-02 ENCOUNTER — Ambulatory Visit (INDEPENDENT_AMBULATORY_CARE_PROVIDER_SITE_OTHER): Payer: Medicare Other | Admitting: Pharmacist

## 2019-11-02 ENCOUNTER — Other Ambulatory Visit: Payer: Self-pay

## 2019-11-02 DIAGNOSIS — Z7901 Long term (current) use of anticoagulants: Secondary | ICD-10-CM | POA: Diagnosis not present

## 2019-11-02 DIAGNOSIS — I482 Chronic atrial fibrillation, unspecified: Secondary | ICD-10-CM

## 2019-11-02 LAB — POCT INR: INR: 4.2 — AB (ref 2.0–3.0)

## 2019-11-02 NOTE — Patient Instructions (Signed)
Go back to eating your regular amount of greens. Hold coumadin today, take 1/2 tablet tomorrow, then continue on same dosage 1 tablet daily except for 1/2 tablet on Mondays. Recheck in 2 weeks. Call our office if you have any concerns or if you are put on any new medications [phone]

## 2019-11-03 ENCOUNTER — Telehealth: Payer: Self-pay | Admitting: Family Medicine

## 2019-11-03 ENCOUNTER — Other Ambulatory Visit: Payer: Self-pay | Admitting: *Deleted

## 2019-11-03 DIAGNOSIS — N390 Urinary tract infection, site not specified: Secondary | ICD-10-CM

## 2019-11-03 MED ORDER — CIPROFLOXACIN HCL 500 MG PO TABS: 500.0000 mg | ORAL_TABLET | Freq: Two times a day (BID) | ORAL | 0 refills | Status: AC

## 2019-11-03 NOTE — Telephone Encounter (Signed)
No need for an OV. Call in Cipro 500 mg BID for 7 days

## 2019-11-03 NOTE — Telephone Encounter (Signed)
Rx sent per Dr Fry 

## 2019-11-03 NOTE — Telephone Encounter (Signed)
Please advise on if patient needs appointment

## 2019-11-03 NOTE — Telephone Encounter (Signed)
Pt called in to be advised,  Pt says that she is prone to have UTI, pt is having frequent urination, burning when urination and lower back discomfort, suggested apt to pt. Pt declined and asked if provider would send in a Rx since UTI are common for her ?    Pharmacy:  [id number] - [place], [place] [phone] (Phone) [phone] (Fax

## 2019-11-04 ENCOUNTER — Other Ambulatory Visit: Payer: Self-pay | Admitting: Cardiovascular Disease

## 2019-11-06 ENCOUNTER — Other Ambulatory Visit: Payer: Self-pay

## 2019-11-06 ENCOUNTER — Ambulatory Visit (INDEPENDENT_AMBULATORY_CARE_PROVIDER_SITE_OTHER): Payer: Medicare Other | Admitting: *Deleted

## 2019-11-06 DIAGNOSIS — Z5181 Encounter for therapeutic drug level monitoring: Secondary | ICD-10-CM

## 2019-11-06 DIAGNOSIS — Z7901 Long term (current) use of anticoagulants: Secondary | ICD-10-CM

## 2019-11-06 DIAGNOSIS — I482 Chronic atrial fibrillation, unspecified: Secondary | ICD-10-CM | POA: Diagnosis not present

## 2019-11-06 LAB — POCT INR: INR: 2.4 (ref 2.0–3.0)

## 2019-11-06 NOTE — Patient Instructions (Signed)
Description    Continue on same dosage 1 tablet daily except for 1/2 tablet on Mondays. Recheck in 1 week. Call our office if you have any concerns or if you are put on any new medications [phone]

## 2019-11-13 ENCOUNTER — Ambulatory Visit (INDEPENDENT_AMBULATORY_CARE_PROVIDER_SITE_OTHER): Payer: Medicare Other | Admitting: *Deleted

## 2019-11-13 ENCOUNTER — Other Ambulatory Visit: Payer: Self-pay

## 2019-11-13 ENCOUNTER — Other Ambulatory Visit (INDEPENDENT_AMBULATORY_CARE_PROVIDER_SITE_OTHER): Payer: Medicare Other

## 2019-11-13 DIAGNOSIS — Z8619 Personal history of other infectious and parasitic diseases: Secondary | ICD-10-CM

## 2019-11-13 DIAGNOSIS — Z7901 Long term (current) use of anticoagulants: Secondary | ICD-10-CM | POA: Diagnosis not present

## 2019-11-13 DIAGNOSIS — I482 Chronic atrial fibrillation, unspecified: Secondary | ICD-10-CM

## 2019-11-13 DIAGNOSIS — U071 COVID-19: Secondary | ICD-10-CM

## 2019-11-13 LAB — SARS-COV-2 IGG: SARS-COV-2 IgG: 12.68

## 2019-11-13 LAB — POCT INR: INR: 2.3 (ref 2.0–3.0)

## 2019-11-13 NOTE — Patient Instructions (Signed)
Description    Continue on same dosage 1 tablet daily except for 1/2 tablet on Mondays. Recheck in 3 weeks. Call our office if you have any concerns or if you are put on any new medications [phone]

## 2019-12-07 ENCOUNTER — Ambulatory Visit (INDEPENDENT_AMBULATORY_CARE_PROVIDER_SITE_OTHER): Payer: Medicare Other | Admitting: Internal Medicine

## 2019-12-07 ENCOUNTER — Telehealth: Payer: Self-pay | Admitting: *Deleted

## 2019-12-07 ENCOUNTER — Emergency Department (HOSPITAL_BASED_OUTPATIENT_CLINIC_OR_DEPARTMENT_OTHER): Payer: Medicare Other

## 2019-12-07 ENCOUNTER — Emergency Department (HOSPITAL_BASED_OUTPATIENT_CLINIC_OR_DEPARTMENT_OTHER)
Admission: EM | Admit: 2019-12-07 | Discharge: 2019-12-07 | Disposition: A | Discharge status: Home / Self Care | Payer: Medicare Other | Attending: Emergency Medicine | Admitting: Emergency Medicine

## 2019-12-07 ENCOUNTER — Ambulatory Visit: Payer: Medicare Other | Admitting: Family Medicine

## 2019-12-07 ENCOUNTER — Encounter (HOSPITAL_BASED_OUTPATIENT_CLINIC_OR_DEPARTMENT_OTHER): Payer: Self-pay | Admitting: Emergency Medicine

## 2019-12-07 ENCOUNTER — Other Ambulatory Visit: Payer: Self-pay

## 2019-12-07 ENCOUNTER — Ambulatory Visit: Payer: Self-pay

## 2019-12-07 DIAGNOSIS — R519 Headache, unspecified: Secondary | ICD-10-CM | POA: Diagnosis not present

## 2019-12-07 DIAGNOSIS — E039 Hypothyroidism, unspecified: Secondary | ICD-10-CM | POA: Diagnosis not present

## 2019-12-07 DIAGNOSIS — Z85038 Personal history of other malignant neoplasm of large intestine: Secondary | ICD-10-CM | POA: Insufficient documentation

## 2019-12-07 DIAGNOSIS — Z79899 Other long term (current) drug therapy: Secondary | ICD-10-CM | POA: Diagnosis not present

## 2019-12-07 DIAGNOSIS — Z7901 Long term (current) use of anticoagulants: Secondary | ICD-10-CM | POA: Insufficient documentation

## 2019-12-07 DIAGNOSIS — R5383 Other fatigue: Secondary | ICD-10-CM | POA: Diagnosis not present

## 2019-12-07 DIAGNOSIS — I252 Old myocardial infarction: Secondary | ICD-10-CM | POA: Diagnosis not present

## 2019-12-07 DIAGNOSIS — I4891 Unspecified atrial fibrillation: Secondary | ICD-10-CM | POA: Insufficient documentation

## 2019-12-07 DIAGNOSIS — Z9861 Coronary angioplasty status: Secondary | ICD-10-CM | POA: Diagnosis not present

## 2019-12-07 DIAGNOSIS — I5031 Acute diastolic (congestive) heart failure: Secondary | ICD-10-CM | POA: Diagnosis not present

## 2019-12-07 DIAGNOSIS — I110 Hypertensive heart disease with heart failure: Secondary | ICD-10-CM | POA: Insufficient documentation

## 2019-12-07 DIAGNOSIS — R42 Dizziness and giddiness: Secondary | ICD-10-CM | POA: Insufficient documentation

## 2019-12-07 DIAGNOSIS — Z5181 Encounter for therapeutic drug level monitoring: Secondary | ICD-10-CM | POA: Diagnosis not present

## 2019-12-07 DIAGNOSIS — J984 Other disorders of lung: Secondary | ICD-10-CM | POA: Diagnosis not present

## 2019-12-07 DIAGNOSIS — E785 Hyperlipidemia, unspecified: Secondary | ICD-10-CM | POA: Diagnosis not present

## 2019-12-07 LAB — BASIC METABOLIC PANEL
Anion gap: 8 (ref 5–15)
BUN: 18 mg/dL (ref 8–23)
CO2: 25 mmol/L (ref 22–32)
Calcium: 9.4 mg/dL (ref 8.9–10.3)
Chloride: 100 mmol/L (ref 98–111)
Creatinine, Ser: 0.89 mg/dL (ref 0.44–1.00)
GFR calc Af Amer: >60 mL/min (ref >60)
GFR calc non Af Amer: 58 mL/min — ABNORMAL LOW (ref >60)
Glucose, Bld: 113 mg/dL — ABNORMAL HIGH (ref 70–99)
Potassium: 4.5 mmol/L (ref 3.5–5.1)
Sodium: 133 mmol/L — ABNORMAL LOW (ref 135–145)

## 2019-12-07 LAB — URINALYSIS, MICROSCOPIC (REFLEX)

## 2019-12-07 LAB — CBC WITH DIFFERENTIAL/PLATELET
Abs Immature Granulocytes: 0.02 10*3/uL (ref 0.00–0.07)
Basophils Absolute: 0.1 10*3/uL (ref 0.0–0.1)
Basophils Relative: 1 %
Eosinophils Absolute: 0.2 10*3/uL (ref 0.0–0.5)
Eosinophils Relative: 2 %
HCT: 38.6 % (ref 36.0–46.0)
Hemoglobin: 12.6 g/dL (ref 12.0–15.0)
Immature Granulocytes: 0 %
Lymphocytes Relative: 11 %
Lymphs Abs: 1 10*3/uL (ref 0.7–4.0)
MCH: 32.8 pg (ref 26.0–34.0)
MCHC: 32.6 g/dL (ref 30.0–36.0)
MCV: 100.5 fL — ABNORMAL HIGH (ref 80.0–100.0)
Monocytes Absolute: 0.6 10*3/uL (ref 0.1–1.0)
Monocytes Relative: 7 %
Neutro Abs: 6.9 10*3/uL (ref 1.7–7.7)
Neutrophils Relative %: 79 %
Platelets: 250 10*3/uL (ref 150–400)
RBC: 3.84 MIL/uL — ABNORMAL LOW (ref 3.87–5.11)
RDW: 13.2 % (ref 11.5–15.5)
WBC: 8.8 10*3/uL (ref 4.0–10.5)
nRBC: 0 % (ref 0.0–0.2)

## 2019-12-07 LAB — HEPATIC FUNCTION PANEL
ALT: 19 U/L (ref 0–44)
AST: 24 U/L (ref 15–41)
Albumin: 4.4 g/dL (ref 3.5–5.0)
Alkaline Phosphatase: 82 U/L (ref 38–126)
Bilirubin, Direct: 0.3 mg/dL — ABNORMAL HIGH (ref 0.0–0.2)
Indirect Bilirubin: 1.2 mg/dL — ABNORMAL HIGH (ref 0.3–0.9)
Total Bilirubin: 1.5 mg/dL — ABNORMAL HIGH (ref 0.3–1.2)
Total Protein: 7.3 g/dL (ref 6.5–8.1)

## 2019-12-07 LAB — URINALYSIS, ROUTINE W REFLEX MICROSCOPIC
Bilirubin Urine: NEGATIVE
Glucose, UA: NEGATIVE mg/dL
Ketones, ur: NEGATIVE mg/dL
Leukocytes,Ua: NEGATIVE
Nitrite: NEGATIVE
Protein, ur: NEGATIVE mg/dL
Specific Gravity, Urine: 1.01 (ref 1.005–1.030)
pH: 6 (ref 5.0–8.0)

## 2019-12-07 LAB — PROTIME-INR
INR: 4.2 (ref 0.8–1.2)
Prothrombin Time: 40.7 seconds — ABNORMAL HIGH (ref 11.4–15.2)

## 2019-12-07 MED ORDER — SODIUM CHLORIDE 0.9 % IV BOLUS
1000.0000 mL | Freq: Once | INTRAVENOUS | Status: AC
  Administered 2019-12-07: 1000 mL via INTRAVENOUS

## 2019-12-07 NOTE — ED Triage Notes (Signed)
Pt had Covid in November.  Fatigue was the only sx. Sts she never got full strength back.  For a few days she has had worsening fatigue, dizziness, lightheadedness, nausea, and sts her heart has been beating faster than normal.  Sts she has chronic A-fib.

## 2019-12-07 NOTE — Discharge Instructions (Addendum)
Hold your Coumadin today as your INR is 4.2.  Call your primary care doctor to schedule follow-up appointment.  Call your Coumadin clinic to figure out necessary changes and need for INR follow-up.

## 2019-12-07 NOTE — ED Provider Notes (Signed)
[facility] EMERGENCY DEPARTMENT Provider Note   CSN: [id number] Arrival date & time: 12/07/19  1038     History Chief Complaint  Patient presents with  . Dizzy, lightheaded, fatigue    [name] is a 86 y.o. female.  The history is provided by the patient.  Weakness Severity:  Mild Onset quality:  Gradual Timing:  Intermittent Progression:  Waxing and waning Chronicity:  New Context comment:  General fatigue since having covid 2 months ago. Intermittent palpitations and weakness and dizziness. Has had recent UTI.  No black or bloody stools.  Relieved by:  Nothing Worsened by:  Nothing Associated symptoms: near-syncope   Associated symptoms: no abdominal pain, no arthralgias, no chest pain, no cough, no dizziness, no dysuria, no fever, no headaches, no loss of consciousness, no melena, no seizures, no shortness of breath and no vomiting   Risk factors comment:  HTN, HLD      Past Medical History:  Diagnosis Date  . Anxiety   . Atrial fibrillation ([place])   . Cancer ([place])   . Cataract    REMOVED BILATERAL  . Chest pain, unspecified   . Chronic low back pain   . Diverticulosis of colon   . History of colon cancer   . History of colonic polyps   . History of colonoscopy   . History of uterine cancer   . Hyperlipidemia   . Hypertension   . Hypothyroid   . Impaired glucose tolerance   . Mitral valve disorder   . Osteoarthritis   . Osteoporosis   . Patent foramen ovale   . Peripheral neuropathy   . Peripheral vascular disease ([place])   . Splenic infarction   . UTI (lower urinary tract infection)   . Venous insufficiency   . Vitamin B12 deficiency     Patient Active Problem List   Diagnosis Date Noted  . NSTEMI (non-ST elevated myocardial infarction) ([place]) 02/12/2019  . Chest pain of uncertain etiology [id number]  . Sore throat 10/30/2018  . Fatigue 10/30/2018  . Dysuria 10/30/2018  . Hypoxemia 01/16/2018  . Lower respiratory infection  01/16/2018  . History of colon cancer in adulthood 01/16/2018  . Diastolic congestive heart failure with preserved left ventricular function, NYHA class 2 ([place]) 01/16/2018  . SBO (small bowel obstruction) ([place]) 04/02/2017  . CAP (community acquired pneumonia) 02/24/2016  . Fever blister 02/24/2016  . Bilateral pneumonia 02/17/2016  . Acute respiratory failure with hypercapnia ([place]) 02/17/2016  . Acute diastolic heart failure ([place])   . Dehydration 12/15/2015  . Sepsis ([place]) 12/15/2015  . Enteritis 12/15/2015  . Diarrhea   . Charcot's joint of left foot 11/24/2015  . Vasculitis ([place]) 03/11/2015  . Aortic stenosis 09/14/2013  . Long term current use of anticoagulant 01/09/2011  . COLONIC POLYPS 10/29/2010  . DIVERTICULOSIS OF COLON 10/29/2010  . ABNORMAL CV (STRESS) TEST 04/20/2010  . CHEST PAIN UNSPECIFIED 04/17/2010  . B12 deficiency 12/23/2008  . IMPAIRED GLUCOSE TOLERANCE 12/23/2008  . PERIPHERAL NEUROPATHY 06/22/2008  . SPLENIC INFARCTION 02/20/2008  . Anxiety state 02/20/2008  . Moderate mitral regurgitation 02/20/2008  . PERIPHERAL VASCULAR DISEASE 02/20/2008  . Venous (peripheral) insufficiency 02/20/2008  . URINARY TRACT INFECTION 02/20/2008  . Osteoarthritis 02/20/2008  . OSTEOPOROSIS 02/20/2008  . UTERINE CANCER, HX OF 02/20/2008  . Malignant neoplasm of colon ([place]) 01/13/2008  . Hypothyroidism 01/13/2008  . Hyperlipemia 01/13/2008  . Essential hypertension 01/13/2008  . ATRIAL FIBRILLATION 01/13/2008  . LOW BACK PAIN, CHRONIC 01/13/2008  Past Surgical History:  Procedure Laterality Date  . ANTERIOR AND POSTERIOR VAGINAL REPAIR  03/2006   Dr.Neal  . basal cell skin cancer Moh's surgery    . CATARACT EXTRACTION, BILATERAL    . COLONOSCOPY  05-25-10   per Dr. Henrene Pastor, benign polyps, repeat in 5 yrs   . EYE SURGERY    . hysterectomy for uterine cancer    . left carpal tunnel surgery  02/2007   Dr. Daylene Katayama  . LEFT HEART CATH AND CORONARY ANGIOGRAPHY N/A 02/12/2019    Procedure: LEFT HEART CATH AND CORONARY ANGIOGRAPHY;  Surgeon: Lorretta Harp, MD;  Location: [facility];  Service: Cardiovascular;  Laterality: N/A;  . lumbar laminectomy for spinal stenosis  2006   L3-4  . RETINAL DETACHMENT REPAIR W/ SCLERAL BUCKLE LE Right 03-01-14   per Dr. Zadie Rhine   . right hemicolectomy for colon cancer  1994     OB History   No obstetric history on file.     Family History  Problem Relation Age of Onset  . Cancer Mother        deceased age 58  . Heart failure Father        deceased age 36  . Cancer Brother        deceased age 23  . Stroke Sister        and heart problems; deceased age 30  . Heart disease Sister     Social History   Tobacco Use  . Smoking status: Never Smoker  . Smokeless tobacco: Never Used  Substance Use Topics  . Alcohol use: Yes    Alcohol/week: 0.0 standard drinks    Comment: occ  . Drug use: No    Home Medications Prior to Admission medications   Medication Sig Start Date End Date Taking? Authorizing Provider  acetaminophen (TYLENOL) 500 MG tablet Take 2 tablets (1,000 mg total) by mouth 2 (two) times daily as needed. 04/30/18   Laurey Morale, MD  ALPRAZolam Duanne Moron) 0.25 MG tablet TAKE 1 TABLET BY MOUTH AT BEDTIME AS NEEDED 05/27/19   Laurey Morale, MD  atorvastatin (LIPITOR) 20 MG tablet TAKE 1 TABLET BY MOUTH  DAILY 07/28/19   Josue Hector, MD  Calcium Carbonate-Vitamin D (CALTRATE 600+D) 600-400 MG-UNIT per tablet Take 1 tablet by mouth daily.      [provider]  carvedilol (COREG) 12.5 MG tablet Take 1 tablet (12.5 mg total) by mouth 2 (two) times daily with a meal. 09/10/19   Laurey Morale, MD  cetirizine (ZYRTEC) 10 MG tablet Take 1 tablet (10 mg total) by mouth daily. 10/30/18   Hoyt Koch, MD  CLIMARA 0.1 MG/24HR patch Use as directed 03/23/19   [provider]  furosemide (LASIX) 40 MG tablet Take 1 tablet (40 mg total) by mouth daily. 09/10/19   Laurey Morale, MD   ketorolac (ACULAR) 0.5 % ophthalmic solution Place 1 drop into the right eye 2 (two) times daily.  03/17/15   [provider]  Multiple Vitamins-Minerals (CENTRUM SILVER PO) Take 1 tablet by mouth daily.      [provider]  nitroGLYCERIN (NITROSTAT) 0.4 MG SL tablet Place 1 tablet (0.4 mg total) under the tongue every 5 (five) minutes as needed for chest pain. 02/13/19   Tommie Raymond, NP  ondansetron (ZOFRAN ODT) 4 MG disintegrating tablet Take 1 tablet (4 mg total) by mouth every 8 (eight) hours as needed for nausea or vomiting. 09/18/19   Little,  Wenda Overland, MD  phenazopyridine (PYRIDIUM) 200 MG tablet Take 1 tablet (200 mg total) by mouth 3 (three) times daily as needed (urinary burning). 09/03/19   Laurey Morale, MD  potassium chloride SA (KLOR-CON) 20 MEQ tablet Take 1 tablet (20 mEq total) by mouth daily. 09/08/19   Josue Hector, MD  quinapril (ACCUPRIL) 20 MG tablet Take 1 tablet (20 mg total) by mouth at bedtime. 11/05/19   Josue Hector, MD  spironolactone (ALDACTONE) 25 MG tablet TAKE 1 TABLET BY MOUTH  DAILY 07/28/19   Josue Hector, MD  SYNTHROID 100 MCG tablet TAKE 1 TABLET BY MOUTH  DAILY BEFORE BREAKFAST 04/28/19   Laurey Morale, MD  vitamin B-12 (CYANOCOBALAMIN) 1000 MCG tablet Take 1,000 mcg by mouth daily.      [provider]  warfarin (COUMADIN) 5 MG tablet TAKE  AS DIRECTED BY  Rachel 10/29/19   Josue Hector, MD    Allergies    Codeine  Review of Systems   Review of Systems  Constitutional: Negative for chills and fever.  HENT: Negative for ear pain and sore throat.   Eyes: Negative for pain and visual disturbance.  Respiratory: Negative for cough and shortness of breath.   Cardiovascular: Positive for near-syncope. Negative for chest pain and palpitations.  Gastrointestinal: Negative for abdominal pain, melena and vomiting.  Genitourinary: Negative for dysuria and hematuria.  Musculoskeletal: Negative for arthralgias and  back pain.  Skin: Negative for color change and rash.  Neurological: Positive for weakness and light-headedness. Negative for dizziness, tremors, seizures, loss of consciousness, syncope, facial asymmetry, speech difficulty, numbness and headaches.  All other systems reviewed and are negative.   Physical Exam Updated Vital Signs  ED Triage Vitals  Enc Vitals Group     BP 12/07/19 1048 (!) 137/56     Pulse Rate 12/07/19 1048 74     Resp 12/07/19 1048 18     Temp 12/07/19 1048 98.8 F (37.1 C)     Temp Source 12/07/19 1048 Oral     SpO2 12/07/19 1048 99 %     Weight 12/07/19 1048 166 lb (75.3 kg)     Height 12/07/19 1048 5\' 11"  (1.803 m)     Head Circumference --      Peak Flow --      Pain Score 12/07/19 1049 0     Pain Loc --      Pain Edu? --      Excl. in [place]? --     Physical Exam Vitals and nursing note reviewed.  Constitutional:      General: She is not in acute distress.    Appearance: She is well-developed. She is not ill-appearing.  HENT:     Head: Normocephalic and atraumatic.     Nose: Nose normal.     Mouth/Throat:     Mouth: Mucous membranes are moist.  Eyes:     Extraocular Movements: Extraocular movements intact.     Conjunctiva/sclera: Conjunctivae normal.     Pupils: Pupils are equal, round, and reactive to light.  Cardiovascular:     Rate and Rhythm: Normal rate and regular rhythm.     Pulses: Normal pulses.     Heart sounds: Normal heart sounds. No murmur.  Pulmonary:     Effort: Pulmonary effort is normal. No respiratory distress.     Breath sounds: Normal breath sounds.  Abdominal:     General: Abdomen is flat.     Palpations: Abdomen is  soft.     Tenderness: There is no abdominal tenderness.  Musculoskeletal:     Cervical back: Normal range of motion and neck supple.  Skin:    General: Skin is warm and dry.     Capillary Refill: Capillary refill takes less than 2 seconds.  Neurological:     General: No focal deficit present.     Mental  Status: She is alert and oriented to person, place, and time.     Cranial Nerves: No cranial nerve deficit.     Sensory: No sensory deficit.     Motor: No weakness.     Coordination: Coordination normal.     Comments: 5+ out of 5 strength throughout, normal sensation, normal finger-to-nose finger, no drift  Psychiatric:        Mood and Affect: Mood normal.     ED Results / Procedures / Treatments   Labs (all labs ordered are listed, but only abnormal results are displayed) Labs Reviewed  CBC WITH DIFFERENTIAL/PLATELET - Abnormal; Notable for the following components:      Result Value   RBC 3.84 (*)    MCV 100.5 (*)    All other components within normal limits  BASIC METABOLIC PANEL - Abnormal; Notable for the following components:   Sodium 133 (*)    Glucose, Bld 113 (*)    GFR calc non Af Amer 58 (*)    All other components within normal limits  HEPATIC FUNCTION PANEL - Abnormal; Notable for the following components:   Total Bilirubin 1.5 (*)    Bilirubin, Direct 0.3 (*)    Indirect Bilirubin 1.2 (*)    All other components within normal limits  URINALYSIS, ROUTINE W REFLEX MICROSCOPIC - Abnormal; Notable for the following components:   Hgb urine dipstick SMALL (*)    All other components within normal limits  PROTIME-INR - Abnormal; Notable for the following components:   Prothrombin Time 40.7 (*)    INR 4.2 (*)    All other components within normal limits  URINALYSIS, MICROSCOPIC (REFLEX) - Abnormal; Notable for the following components:   Bacteria, UA FEW (*)    All other components within normal limits    EKG EKG Interpretation  Date/Time:  Monday December 07 2019 10:49:43 EST Ventricular Rate:  76 PR Interval:    QRS Duration: 90 QT Interval:  388 QTC Calculation: 437 R Axis:   69 Text Interpretation: Atrial fibrillation Minimal ST depression, diffuse leads Confirmed by Lennice Sites [phone]) on 12/07/2019 10:53:50 AM   Radiology CT Head Wo Contrast   Result Date: 12/07/2019 CLINICAL DATA:  Headache. EXAM: CT HEAD WITHOUT CONTRAST TECHNIQUE: Contiguous axial images were obtained from the base of the skull through the vertex without intravenous contrast. COMPARISON:  None. FINDINGS: Brain: Mild diffuse cortical atrophy is noted. No mass effect or midline shift is noted. Ventricular size is within normal limits. There is no evidence of mass lesion, hemorrhage or acute infarction. Vascular: No hyperdense vessel or unexpected calcification. Skull: Normal. Negative for fracture or focal lesion. Sinuses/Orbits: No acute finding. Other: None. IMPRESSION: Mild diffuse cortical atrophy. No acute intracranial abnormality seen. Electronically Signed   By: Marijo Conception M.D.   On: 12/07/2019 11:53   DG Chest Portable 1 View  Result Date: 12/07/2019 CLINICAL DATA:  Shortness of breath, worsening fatigue, dizziness, lightheadedness, nausea, tachycardia, history chronic atrial fibrillation, had COVID in November EXAM: PORTABLE CHEST 1 VIEW COMPARISON:  Portable exam 1050 hours compared to 02/12/2019 FINDINGS: Enlargement of cardiac  silhouette. Mediastinal contours and pulmonary vascularity normal. Atherosclerotic calcification aorta. Bronchitic changes with improved pulmonary edema versus previous exam. Linear scarring medial LEFT lower lobe. No acute consolidation, pleural effusion or pneumothorax. Bones appear demineralized. IMPRESSION: Enlargement of cardiac silhouette. No acute abnormalities. Aortic Atherosclerosis (ICD10-I70.0). Electronically Signed   By: Lavonia Dana M.D.   On: 12/07/2019 11:11    Procedures Procedures (including critical care time)  Medications Ordered in ED Medications  sodium chloride 0.9 % bolus 1,000 mL (1,000 mLs Intravenous New Bag/Given 12/07/19 1103)    ED Course  I have reviewed the triage vital signs and the nursing notes.  Pertinent labs & imaging results that were available during my care of the patient were reviewed by me  and considered in my medical decision making (see chart for details).    MDM Rules/Calculators/A&P  [name] is an 86 year old female with history of hypertension, atrial fibrillation on Coumadin who presents to the ED with generalized fatigue.  Patient has felt this way since being diagnosed with coronavirus 2 months ago.  Patient with normal vitals.  No fever.  Normal exam.  Normal neurological exam.  Just has had a general feeling of weakness on and off with intermittent dizziness during that time as well.  Has not lost consciousness.  EKG shows sinus rhythm.  No ischemic changes.  Does not have any chest pain.  Doubt ACS.  Of note patient did have cardiac catheterization last year with normal coronaries.  Has no risk factors for PE and she is anticoagulated.  Does not have any respiratory symptoms.  No signs of urinary tract infection.  No significant anemia, electrolyte abnormality, kidney injury.  No pneumonia or pneumothorax on chest x-ray.  CT scan showed no acute findings.  INR was 4.2.  I recommend that she hold her Coumadin dose and follow-up with her physician about when to restart her Coumadin.  She was supposed to have an appointment today and will follow up with them about further recommendations.  Patient given reassurance and discharged in the ED in good condition.  Understands return precautions.  This chart was dictated using voice recognition software.  Despite best efforts to proofread,  errors can occur which can change the documentation meaning.    Final Clinical Impression(s) / ED Diagnoses Final diagnoses:  None    Rx / DC Orders ED Discharge Orders    None       Lennice Sites, DO 12/07/19 1234

## 2019-12-07 NOTE — Telephone Encounter (Signed)
No appointment was scheduled. Please advise should this be a virtual appointment or in office?

## 2019-12-07 NOTE — Telephone Encounter (Signed)
Set up an in person OV tomorrow

## 2019-12-07 NOTE — ED Notes (Signed)
ED Provider at bedside. 

## 2019-12-07 NOTE — Patient Instructions (Addendum)
Description   Pt called and instructed her to hold warfarin today and tomorrow, then continue on same dosage 1 tablet daily except for 1/2 tablet on Mondays. Recheck in 2 weeks. Call our office if you have any concerns or if you are put on any new medications [phone]

## 2019-12-07 NOTE — Telephone Encounter (Signed)
Spoke with patient. Appointment has been scheduled

## 2019-12-07 NOTE — Telephone Encounter (Signed)
Pt left a message stating she does not feel well and is going to the ER since she cannot be seen by her PCP today. She will update [place] once she returns from appt later today or tomorrow. Will cancel appt per pt request.

## 2019-12-07 NOTE — Telephone Encounter (Signed)
Pt. Reports she started feeling bad 2 weeks ago with dizziness and weakness. "It's getting worse." Last night BP 145/84 pulse 97. Occasional chest pain, none today. Warm transfer to [facility] in the practice for a visit. Reason for Disposition . [1] MODERATE dizziness (e.g., interferes with normal activities) AND [2] has NOT been evaluated by physician for this  (Exception: dizziness caused by heat exposure, sudden standing, or poor fluid intake)  Answer Assessment - Initial Assessment Questions 1. DESCRIPTION: "Describe your dizziness."     Lightheadedness 2. LIGHTHEADED: "Do you feel lightheaded?" (e.g., somewhat faint, woozy, weak upon standing)     Even at rest 3. VERTIGO: "Do you feel like either you or the room is spinning or tilting?" (i.e. vertigo)     No 4. SEVERITY: "How bad is it?"  "Do you feel like you are going to faint?" "Can you stand and walk?"   - MILD - walking normally   - MODERATE - interferes with normal activities (e.g., work, school)    - SEVERE - unable to stand, requires support to walk, feels like passing out now.      Moderate 5. ONSET:  "When did the dizziness begin?"     Started 2 weeks but getting worse 6. AGGRAVATING FACTORS: "Does anything make it worse?" (e.g., standing, change in head position)     No 7. HEART RATE: "Can you tell me your heart rate?" "How many beats in 15 seconds?"  (Note: not all patients can do this)       Last night pulse 97 BP 145/84 8. CAUSE: "What do you think is causing the dizziness?"     Unsure 9. RECURRENT SYMPTOM: "Have you had dizziness before?" If so, ask: "When was the last time?" "What happened that time?"     No 10. OTHER SYMPTOMS: "Do you have any other symptoms?" (e.g., fever, chest pain, vomiting, diarrhea, bleeding)       Occasional shortness of breath 11. PREGNANCY: "Is there any chance you are pregnant?" "When was your last menstrual period?"       No  Protocols used: DIZZINESS [facility]

## 2019-12-08 ENCOUNTER — Other Ambulatory Visit: Payer: Self-pay

## 2019-12-08 ENCOUNTER — Encounter: Payer: Self-pay | Admitting: Family Medicine

## 2019-12-08 ENCOUNTER — Ambulatory Visit (INDEPENDENT_AMBULATORY_CARE_PROVIDER_SITE_OTHER): Payer: Medicare Other | Admitting: Family Medicine

## 2019-12-08 VITALS — BP 110/62 | HR 70 | Temp 97.3°F | Wt 171.0 lb

## 2019-12-08 DIAGNOSIS — I5030 Unspecified diastolic (congestive) heart failure: Secondary | ICD-10-CM

## 2019-12-08 DIAGNOSIS — I482 Chronic atrial fibrillation, unspecified: Secondary | ICD-10-CM | POA: Diagnosis not present

## 2019-12-08 DIAGNOSIS — G933 Postviral fatigue syndrome: Secondary | ICD-10-CM | POA: Diagnosis not present

## 2019-12-08 DIAGNOSIS — U071 COVID-19: Secondary | ICD-10-CM

## 2019-12-08 DIAGNOSIS — I340 Nonrheumatic mitral (valve) insufficiency: Secondary | ICD-10-CM | POA: Diagnosis not present

## 2019-12-08 DIAGNOSIS — I350 Nonrheumatic aortic (valve) stenosis: Secondary | ICD-10-CM | POA: Diagnosis not present

## 2019-12-08 DIAGNOSIS — G9331 Postviral fatigue syndrome: Secondary | ICD-10-CM

## 2019-12-08 MED ORDER — MECLIZINE HCL 25 MG PO TABS: 25.0000 mg | ORAL_TABLET | ORAL | 5 refills | Status: DC | PRN

## 2019-12-08 MED ORDER — NITROGLYCERIN 0.4 MG SL SUBL: 0.4000 mg | SUBLINGUAL_TABLET | SUBLINGUAL | 5 refills | Status: DC | PRN

## 2019-12-08 MED ORDER — CARVEDILOL 6.25 MG PO TABS: 6.2500 mg | ORAL_TABLET | Freq: Two times a day (BID) | ORAL | 3 refills | Status: DC

## 2019-12-08 NOTE — Progress Notes (Signed)
   Subjective:    Patient ID: [name], female    DOB: 1929-01-17, 86 y.o.   MRN: [id number]  HPI Here to follow up on an ER visit yesterday for generalized fatigue, light headedness, dizziness, and palpitations. Of note she tested positive for the Covid-19 virus 2 months ago. On exam she had her usual atrial fibrillation with a controlled ventricular rate. Her BP was 137/56. Labs were basically unremarkable except for an INR of 4.2. She was told to hold her Coumadin until she checks with the Coumadin clinic. She did so, and they told her to resume taking it tomorrow. Her CXR was clear and a had CT showed only generalized mild atrophy. She was given IF fluids and sent home. Since then she has felt a little better but is still weak. Her appetite is fairly good.    Review of Systems  Constitutional: Positive for fatigue. Negative for chills, diaphoresis and fever.  Respiratory: Negative.   Cardiovascular: Negative.   Neurological: Positive for dizziness and light-headedness. Negative for tremors, seizures, syncope, facial asymmetry, speech difficulty, numbness and headaches.       Objective:   Physical Exam Constitutional:      Appearance: Normal appearance.     Comments: Walks with a cane   Cardiovascular:     Rate and Rhythm: Normal rate. Rhythm irregular.     Pulses: Normal pulses.     Comments: Has her usual systolic murmur  Pulmonary:     Effort: Pulmonary effort is normal.     Breath sounds: Normal breath sounds.  Neurological:     General: No focal deficit present.     Mental Status: She is alert and oriented to person, place, and time.     Cranial Nerves: No cranial nerve deficit.     Coordination: Coordination normal.     Gait: Gait normal.           Assessment & Plan:  She seems to have some residual fatigue from the Covid infection, and her BP is a little low. We will decrease the Carvedilol to 6.25 mg BID. She will drink plenty of fluids and I asked her to  drink a can of Ensure or Boost daily. She will follow up with Dr. Johnsie Cancel on 01-26-20.  Alysia Penna, MD

## 2019-12-21 ENCOUNTER — Other Ambulatory Visit: Payer: Self-pay

## 2019-12-21 ENCOUNTER — Ambulatory Visit (INDEPENDENT_AMBULATORY_CARE_PROVIDER_SITE_OTHER): Payer: Medicare Other | Admitting: *Deleted

## 2019-12-21 DIAGNOSIS — Z7901 Long term (current) use of anticoagulants: Secondary | ICD-10-CM

## 2019-12-21 DIAGNOSIS — I482 Chronic atrial fibrillation, unspecified: Secondary | ICD-10-CM

## 2019-12-21 LAB — POCT INR: INR: 2.1 (ref 2.0–3.0)

## 2019-12-21 NOTE — Patient Instructions (Signed)
Description   Continue taking 1 tablet daily except for 1/2 tablet on Mondays. Recheck in 3 weeks. Call our office if you have any concerns or if you are put on any new medications [phone]

## 2019-12-24 DIAGNOSIS — Z961 Presence of intraocular lens: Secondary | ICD-10-CM | POA: Diagnosis not present

## 2019-12-24 DIAGNOSIS — H31011 Macula scars of posterior pole (postinflammatory) (post-traumatic), right eye: Secondary | ICD-10-CM | POA: Diagnosis not present

## 2019-12-24 DIAGNOSIS — H04123 Dry eye syndrome of bilateral lacrimal glands: Secondary | ICD-10-CM | POA: Diagnosis not present

## 2019-12-29 ENCOUNTER — Other Ambulatory Visit: Payer: Self-pay

## 2019-12-29 ENCOUNTER — Telehealth (INDEPENDENT_AMBULATORY_CARE_PROVIDER_SITE_OTHER): Payer: Medicare Other | Admitting: Family Medicine

## 2019-12-29 DIAGNOSIS — R110 Nausea: Secondary | ICD-10-CM | POA: Diagnosis not present

## 2019-12-29 MED ORDER — ONDANSETRON HCL 8 MG PO TABS: 8.0000 mg | ORAL_TABLET | Freq: Four times a day (QID) | ORAL | 2 refills | Status: DC | PRN

## 2019-12-29 NOTE — Progress Notes (Signed)
Virtual Visit via Telephone Note  I connected with the patient on 12/29/19 at  3:45 PM EST by telephone and verified that I am speaking with the correct person using two identifiers.   I discussed the limitations, risks, security and privacy concerns of performing an evaluation and management service by telephone and the availability of in person appointments. I also discussed with the patient that there may be a patient responsible charge related to this service. The patient expressed understanding and agreed to proceed.  Location patient: home Location provider: work or home office Participants present for the call: patient, provider Patient did not have a visit in the prior 7 days to address this/these issue(s).   History of Present Illness: Here for side effects of the Moderna Covid-19 vaccine. She lives in a long term care facility, and the staff is giving all the residents Covid vaccines. [place] got her first one yesterday, and by last night she was having fevers to 101 degrees, body aches, and nausea without vomiting. She is drinking water and Gatorade. Tylenol is controlling the fever and the aches, but the nausea is her main complaint.    Observations/Objective: Patient sounds cheerful and well on the phone. I do not appreciate any SOB. Speech and thought processing are grossly intact. Patient reported vitals:  Assessment and Plan: Side effects of the Covid vaccine. We will send in Zofran 8 mg that she can use as needed for nausea. She does plan to get the second vaccine as scheduled.  Alysia Penna, MD   Follow Up Instructions:     [phone] 5-10 [phone] 11-20 [id number] 21-30 I did not refer this patient for an OV in the next 24 hours for this/these issue(s).  I discussed the assessment and treatment plan with the patient. The patient was provided an opportunity to ask questions and all were answered. The patient agreed with the plan and demonstrated an understanding of the  instructions.   The patient was advised to call back or seek an in-person evaluation if the symptoms worsen or if the condition fails to improve as anticipated.  I provided 12 minutes of non-face-to-face time during this encounter.   Alysia Penna, MD

## 2020-01-06 NOTE — Progress Notes (Deleted)
Date:  01/06/2020   ID:  [name], DOB 1929/02/05, MRN [id number]  PCP:  Laurey Morale, MD  Cardiologist:  Jenkins Rouge, MD   Electrophysiologist:  None   Evaluation Performed:  Follow-Up Visit  Chief Complaint:  Afib  History of Present Illness:    86 y.o. with HTN, chronic afib , moderate AS, HLD Admitted to hospital in March 2020 with SSCP and mildly elevated troponin. New RBBB that eventually resolved. However cath done 02/12/19 showed normal coronary arteries. EF 60-65% no evidence of Takatsubo DCM. And  aortogram with no dissection AS more mild with mean gradient 14 mmHg. Peak troponin was 12.99. D/C with higher dose lasix 40 mg bid  And d/c aldactone ? Volume overload Rhythm through out was chronic afib. 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  INR was 4.2 held coumadin for a couple days and most recent 12/21/19 was good at 2.1 CT head negative CXR no infiltrate ? CE.   ***  Past Medical History:  Diagnosis Date  . Anxiety   . Atrial fibrillation ([place])   . Cancer ([place])   . Cataract    REMOVED BILATERAL  . Chest pain, unspecified   . Chronic low back pain   . Diverticulosis of colon   . History of colon cancer   . History of colonic polyps   .  History of colonoscopy   . History of uterine cancer   . Hyperlipidemia   . Hypertension   . Hypothyroid   . Impaired glucose tolerance   . Mitral valve disorder   . Osteoarthritis   . Osteoporosis   . Patent foramen ovale   . Peripheral neuropathy   . Peripheral vascular disease ([place])   . Splenic infarction   . UTI (lower urinary tract infection)   . Venous insufficiency   . Vitamin B12 deficiency    Past Surgical History:  Procedure Laterality Date  . ANTERIOR AND POSTERIOR VAGINAL REPAIR  03/2006   Dr.Neal  . basal cell skin cancer Moh's surgery    . CATARACT EXTRACTION, BILATERAL    . COLONOSCOPY  05-25-10   per Dr. Henrene Pastor, benign polyps, repeat in 5 yrs   . EYE SURGERY    . hysterectomy for uterine cancer    . left carpal tunnel surgery  02/2007   Dr. Daylene Katayama  . LEFT HEART CATH AND CORONARY ANGIOGRAPHY N/A 02/12/2019   Procedure: LEFT HEART CATH AND CORONARY ANGIOGRAPHY;  Surgeon: Lorretta Harp, MD;  Location: [facility];  Service: Cardiovascular;  Laterality: N/A;  . lumbar laminectomy for spinal stenosis  2006   L3-4  . RETINAL DETACHMENT REPAIR W/ SCLERAL BUCKLE LE Right 03-01-14   per Dr. Zadie Rhine   .  right hemicolectomy for colon cancer  1994     No outpatient medications have been marked as taking for the 01/14/20 encounter (Appointment) with Josue Hector, MD.     Allergies:   Codeine   Social History   Tobacco Use  . Smoking status: Never Smoker  . Smokeless tobacco: Never Used  Substance Use Topics  . Alcohol use: Yes    Alcohol/week: 0.0 standard drinks    Comment: occ  . Drug use: No     Family Hx: The patient's family history includes Cancer in her brother and mother; Heart disease in her sister; Heart failure in her father; Stroke in her sister.  ROS:   Please see the history of present illness.     All other systems reviewed and are negative.   Prior CV studies:   The following studies were reviewed today:  Echo 02/12/19 Cath  02/12/19  Labs/Other Tests and Data Reviewed:    EKG:   afib nonspecific ST changes lateral T wave inversion   Recent Labs: 02/12/2019: Magnesium 2.1 09/10/2019: TSH 0.79 12/07/2019: ALT 19; BUN 18; Creatinine, Ser 0.89; Hemoglobin 12.6; Platelets 250; Potassium 4.5; Sodium 133   Recent Lipid Panel Lab Results  Component Value Date/Time   CHOL 100 02/13/2019 03:09 AM   TRIG 73 02/13/2019 03:09 AM   HDL 37 (L) 02/13/2019 03:09 AM   CHOLHDL 2.7 02/13/2019 03:09 AM   LDLCALC 48 02/13/2019 03:09 AM    Wt Readings from Last 3 Encounters:  12/08/19 171 lb (77.6 kg)  12/07/19 166 lb (75.3 kg)  09/18/19 172 lb (78 kg)     Objective:    Vital Signs:  There were no vitals taken for this visit.   Affect appropriate Elderly white female  HEENT: normal Neck supple with no adenopathy JVP normal no bruits no thyromegaly Lungs clear with no wheezing and good diaphragmatic motion Heart:  S1/S2 no murmur, no rub, gallop or click PMI normal Abdomen: benighn, BS positve, no tenderness, no AAA no bruit.  No HSM or HJR Distal pulses intact with no bruits No edema Neuro non-focal Skin warm and dry No muscular weakness   ASSESSMENT & PLAN:    1. Chest Pain:  Troponin max around 13 with lateral T wave inversions however cath 02/12/19 with no epicardial CAD. May have been embolic event as she is in chronic afib but INR was 2.1 on admission. TTE with normal EF and no evidence of RWMA or Takatsubo. Given age don't think MRI to assess for myocarditis in order 2. Afib: chronic good rate control INR 2.3 a month ago and she has been on stable dose for years 3. HTN: Well controlled.  Continue current medications and low sodium Dash type diet.   4. HLD  On statin labs with primary  5. UTI:  Resolved post antibiotic RX 6. COVID:  Tested positive 10/09/19 most recent CXR 12/07/19 with no infiltrate   COVID-19 Education: The signs and symptoms of COVID-19 were discussed with the patient and how to  seek care for testing (follow up with PCP or arrange E-visit).  The importance of social distancing was discussed today.  Time:   Today, I have spent 30 minutes with the patient with telehealth technology discussing the above problems.     Medication Adjustments/Labs and Tests Ordered: Current medicines are reviewed at length with the patient today.  Concerns regarding medicines are outlined above.   Tests Ordered: No orders of the defined types were placed in this encounter.  Medication Changes: No orders of the defined types were placed in this encounter.   Disposition:  F/U in 6 months   Signed, Jenkins Rouge, MD  01/06/2020 5:08 PM    [place]

## 2020-01-11 ENCOUNTER — Telehealth: Payer: Self-pay | Admitting: Cardiovascular Disease

## 2020-01-11 NOTE — Telephone Encounter (Signed)
Patient is requesting Mordecai Maes to accompany her to her appointment on 01/14/20 with Dr. Johnsie Cancel and [facility]. [name] is her transportation.

## 2020-01-11 NOTE — Telephone Encounter (Signed)
Will defer to Dr Johnsie Cancel and his nurse since she is seeing them first at 11:15 and [place] at 12:00.  Pt usually comes by herself to her [facility] appointments.

## 2020-01-11 NOTE — Telephone Encounter (Signed)
I will send message to both [facility] as well as triage as Dr. Kyla Balzarine nurse is not available at this time.

## 2020-01-12 NOTE — Telephone Encounter (Signed)
Patient stated she has physical limitations and she needs her niece to come with her to her visit. Informed patient that a note would be put in for her appointment.

## 2020-01-13 ENCOUNTER — Other Ambulatory Visit: Payer: Self-pay

## 2020-01-13 ENCOUNTER — Ambulatory Visit (INDEPENDENT_AMBULATORY_CARE_PROVIDER_SITE_OTHER): Payer: Medicare Other

## 2020-01-13 DIAGNOSIS — I482 Chronic atrial fibrillation, unspecified: Secondary | ICD-10-CM | POA: Diagnosis not present

## 2020-01-13 DIAGNOSIS — Z7901 Long term (current) use of anticoagulants: Secondary | ICD-10-CM

## 2020-01-13 LAB — POCT INR: INR: 2.6 (ref 2.0–3.0)

## 2020-01-13 NOTE — Patient Instructions (Signed)
Description   Continue on same dosage 1 tablet daily except for 1/2 tablet on Mondays. Recheck in 4 weeks. Call our office if you have any concerns or if you are put on any new medications [phone]

## 2020-01-14 ENCOUNTER — Ambulatory Visit: Payer: Medicare Other | Admitting: Cardiovascular Disease

## 2020-01-26 ENCOUNTER — Ambulatory Visit: Payer: Medicare Other | Admitting: Cardiovascular Disease

## 2020-02-10 ENCOUNTER — Ambulatory Visit (INDEPENDENT_AMBULATORY_CARE_PROVIDER_SITE_OTHER): Payer: Medicare Other | Admitting: *Deleted

## 2020-02-10 ENCOUNTER — Other Ambulatory Visit: Payer: Self-pay

## 2020-02-10 DIAGNOSIS — Z7901 Long term (current) use of anticoagulants: Secondary | ICD-10-CM

## 2020-02-10 DIAGNOSIS — I482 Chronic atrial fibrillation, unspecified: Secondary | ICD-10-CM | POA: Diagnosis not present

## 2020-02-10 LAB — POCT INR: INR: 1.9 — AB (ref 2.0–3.0)

## 2020-02-10 NOTE — Patient Instructions (Signed)
Description   Today take 1.5 tablets then continue on same dosage 1 tablet daily except for 1/2 tablet on Mondays. Recheck in 4 weeks. Call our office if you have any concerns or if you are put on any new medications [phone]

## 2020-02-29 ENCOUNTER — Telehealth (INDEPENDENT_AMBULATORY_CARE_PROVIDER_SITE_OTHER): Payer: Medicare Other | Admitting: Family Medicine

## 2020-02-29 ENCOUNTER — Other Ambulatory Visit: Payer: Self-pay

## 2020-02-29 ENCOUNTER — Telehealth: Payer: Medicare Other | Admitting: Family Medicine

## 2020-02-29 DIAGNOSIS — R300 Dysuria: Secondary | ICD-10-CM | POA: Diagnosis not present

## 2020-02-29 LAB — POC URINALSYSI DIPSTICK (AUTOMATED)
Bilirubin, UA: NEGATIVE
Blood, UA: NEGATIVE
Glucose, UA: NEGATIVE
Ketones, UA: NEGATIVE
Leukocytes, UA: NEGATIVE
Nitrite, UA: NEGATIVE
Protein, UA: NEGATIVE
Spec Grav, UA: 1.01 (ref 1.010–1.025)
Urobilinogen, UA: 0.2 E.U./dL
pH, UA: 5.5 (ref 5.0–8.0)

## 2020-02-29 NOTE — Progress Notes (Signed)
Virtual Visit via Telephone Note  I connected with [name] on 02/29/20 at 11:30 AM EDT by telephone and verified that I am speaking with the correct person using two identifiers.   I discussed the limitations, risks, security and privacy concerns of performing an evaluation and management service by telephone and the availability of in person appointments. I also discussed with the patient that there may be a patient responsible charge related to this service. The patient expressed understanding and agreed to proceed.  Location patient: home Location provider: work or home office Participants present for the call: patient, provider Patient did not have a visit in the prior 7 days to address this/these issue(s).   History of Present Illness: Pt is an 86 yo female with pmh sig for PVD, HTN, h/o NSTEMI, MR, diastolic CHF, afib on coumadin, patent foramen ovale, AS, h/o colon cancer, h/o uterine cancer, hypothyroidism, osteoporosis, OA, HLD followed by Dr. Sarajane Jews.    Pt with acute concern of dysuria, urine odor, low back discomfort, suprapubic pain since Friday.  Denies fever, chills, vaginal d/c, vaginal irritation.  Pt states she is prone to UTIs.  Notes h/o urosepsis several yrs ago.  May drink 8-10 cups of water per day plus coffee and orange juice.    Pt initially against coming in to provide urine sample, but did.   Observations/Objective: Patient sounds cheerful and well on the phone. I do not appreciate any SOB. Speech and thought processing are grossly intact. Patient reported vitals: Temp 97.89F  Assessment and Plan: Dysuria  -discussed various causes including UTI, renal calculi, vaginitis, etc. -UA negative for signs of infection -pt encouraged to increase po intake of fluids.  Will obtain Ucx. -given precautions - Plan: POCT Urinalysis Dipstick (Automated), Urine Culture   Follow Up Instructions: F/u with pcp prn in the next few days for continued or worsened  symptoms  I did not refer this patient for an OV in the next 24 hours for this/these issue(s).  I discussed the assessment and treatment plan with the patient. The patient was provided an opportunity to ask questions and all were answered. The patient agreed with the plan and demonstrated an understanding of the instructions.   The patient was advised to call back or seek an in-person evaluation if the symptoms worsen or if the condition fails to improve as anticipated.  I provided 5:47 minutes of non-face-to-face time during this encounter.   Billie Ruddy, MD

## 2020-02-29 NOTE — Addendum Note (Signed)
Addended by: Elmer Picker on: 02/29/2020 01:25 PM   Modules accepted: Orders

## 2020-03-01 LAB — URINE CULTURE
MICRO NUMBER:: 10327495
Result:: NO GROWTH
SPECIMEN QUALITY:: ADEQUATE

## 2020-03-04 NOTE — Progress Notes (Signed)
Date:  03/11/2020   ID:  [name], DOB 10/26/1929, MRN [id number]  PCP:  Laurey Morale, MD  Cardiologist:  Jenkins Rouge, MD   Electrophysiologist:  None   Evaluation Performed:  Follow-Up Visit  Chief Complaint:  Afib  History of Present Illness:    86 y.o. with HTN, chronic afib , moderate AS, HLD Admitted to hospital in March 2020 with SSCP and mildly elevated troponin. New RBBB that eventually resolved. However cath done 02/12/19 showed normal coronary arteries. EF 60-65% no evidence of Takatsubo DCM. And  aortogram with no dissection AS more mild with mean gradient 14 mmHg. Peak troponin was 12.99. D/C with higher dose lasix 40 mg bid  And d/c aldactone ? Volume overload Rhythm through out was chronic afib. 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 BP a bit low and coreg decreased to 6.25 bid   She continues to have some SSCP not always exertional relieved with nitro   The patient does not have symptoms concerning for COVID-19 infection (fever, chills, cough, or new shortness of breath).    Past Medical History:  Diagnosis Date  . Anxiety   . Atrial fibrillation ([place])   . Cancer ([place])   . Cataract    REMOVED BILATERAL  . Chest pain, unspecified   . Chronic low back pain   . Diverticulosis of colon   . History of colon cancer   . History of colonic polyps   . History of colonoscopy   . History of uterine cancer   . Hyperlipidemia   . Hypertension   . Hypothyroid   . Impaired glucose tolerance   . Mitral valve disorder   . Osteoarthritis   . Osteoporosis   . Patent foramen ovale   . Peripheral neuropathy   . Peripheral vascular disease ([place])   . Splenic infarction   . UTI (lower  urinary tract infection)   . Venous insufficiency   . Vitamin B12 deficiency    Past Surgical History:  Procedure Laterality Date  . ANTERIOR AND POSTERIOR VAGINAL REPAIR  03/2006   Dr.Neal  . basal cell skin cancer Moh's surgery    . CATARACT EXTRACTION, BILATERAL    . COLONOSCOPY  05-25-10   per Dr. Henrene Pastor, benign polyps, repeat in 5 yrs   . EYE SURGERY    . hysterectomy for uterine cancer    . left carpal tunnel surgery  02/2007   Dr. Daylene Katayama  . LEFT HEART CATH AND CORONARY ANGIOGRAPHY N/A 02/12/2019   Procedure: LEFT HEART CATH AND CORONARY ANGIOGRAPHY;  Surgeon: Lorretta Harp, MD;  Location: [facility];  Service: Cardiovascular;  Laterality: N/A;  . lumbar laminectomy for spinal stenosis  2006   L3-4  . RETINAL DETACHMENT REPAIR W/ SCLERAL BUCKLE LE Right 03-01-14   per Dr. Zadie Rhine   . right hemicolectomy for colon cancer  1994     Current Meds  Medication Sig  . acetaminophen (TYLENOL) 500 MG tablet Take 2 tablets (1,000 mg total) by mouth 2 (two) times daily as needed.  . ALPRAZolam (XANAX) 0.25 MG tablet TAKE 1 TABLET BY MOUTH AT BEDTIME AS NEEDED  . atorvastatin (  LIPITOR) 20 MG tablet TAKE 1 TABLET BY MOUTH  DAILY  . Calcium Carbonate-Vitamin D (CALTRATE 600+D) 600-400 MG-UNIT per tablet Take 1 tablet by mouth daily.    . carvedilol (COREG) 6.25 MG tablet Take 1 tablet (6.25 mg total) by mouth 2 (two) times daily with a meal.  . cetirizine (ZYRTEC) 10 MG tablet Take 1 tablet (10 mg total) by mouth daily.  Marland Kitchen CLIMARA 0.1 MG/24HR patch Use as directed  . furosemide (LASIX) 40 MG tablet Take 1 tablet (40 mg total) by mouth daily.  Marland Kitchen ketorolac (ACULAR) 0.5 % ophthalmic solution Place 1 drop into the right eye 2 (two) times daily.   . meclizine (ANTIVERT) 25 MG tablet Take 1 tablet (25 mg total) by mouth every 4 (four) hours as needed for dizziness.  . Multiple Vitamins-Minerals (CENTRUM SILVER PO) Take 1 tablet by mouth daily.    . nitroGLYCERIN (NITROSTAT) 0.4 MG SL  tablet Place 1 tablet (0.4 mg total) under the tongue every 5 (five) minutes as needed for chest pain.  Marland Kitchen ondansetron (ZOFRAN) 8 MG tablet Take 1 tablet (8 mg total) by mouth every 6 (six) hours as needed for nausea or vomiting.  . phenazopyridine (PYRIDIUM) 200 MG tablet Take 1 tablet (200 mg total) by mouth 3 (three) times daily as needed (urinary burning).  . potassium chloride SA (KLOR-CON) 20 MEQ tablet Take 20 mEq by mouth every other day.  . quinapril (ACCUPRIL) 20 MG tablet Take 1 tablet (20 mg total) by mouth at bedtime.  Marland Kitchen spironolactone (ALDACTONE) 25 MG tablet TAKE 1 TABLET BY MOUTH  DAILY  . SYNTHROID 100 MCG tablet TAKE 1 TABLET BY MOUTH  DAILY BEFORE BREAKFAST  . vitamin B-12 (CYANOCOBALAMIN) 1000 MCG tablet Take 1,000 mcg by mouth daily.    Marland Kitchen warfarin (COUMADIN) 5 MG tablet TAKE  AS DIRECTED BY  COUMDAIN CLINIC     Allergies:   Codeine   Social History   Tobacco Use  . Smoking status: Never Smoker  . Smokeless tobacco: Never Used  Substance Use Topics  . Alcohol use: Yes    Alcohol/week: 0.0 standard drinks    Comment: occ  . Drug use: No     Family Hx: The patient's family history includes Cancer in her brother and mother; Heart disease in her sister; Heart failure in her father; Stroke in her sister.  ROS:   Please see the history of present illness.     All other systems reviewed and are negative.   Prior CV studies:   The following studies were reviewed today:  Echo 02/12/19 Cath 02/12/19  Labs/Other Tests and Data Reviewed:    EKG:  12/07/19 afib nonspecific ST changes lateral T wave inversion   Recent Labs: 09/10/2019: TSH 0.79 12/07/2019: ALT 19; BUN 18; Creatinine, Ser 0.89; Hemoglobin 12.6; Platelets 250; Potassium 4.5; Sodium 133   Recent Lipid Panel Lab Results  Component Value Date/Time   CHOL 100 02/13/2019 03:09 AM   TRIG 73 02/13/2019 03:09 AM   HDL 37 (L) 02/13/2019 03:09 AM   CHOLHDL 2.7 02/13/2019 03:09 AM   LDLCALC 48 02/13/2019  03:09 AM    Wt Readings from Last 3 Encounters:  03/11/20 170 lb (77.1 kg)  12/08/19 171 lb (77.6 kg)  12/07/19 166 lb (75.3 kg)     Objective:    Vital Signs:  BP 120/70   Pulse 60   Ht 5\' 11"  (1.803 m)   Wt 170 lb (77.1 kg)   SpO2 96%  BMI 23.71 kg/m    Affect appropriate Healthy:  appears stated age 25: normal Neck supple with no adenopathy JVP normal no bruits no thyromegaly Lungs clear with no wheezing and good diaphragmatic motion Heart:  S1/S2 AS murmur, no rub, gallop or click PMI normal Abdomen: benighn, BS positve, no tenderness, no AAA no bruit.  No HSM or HJR Distal pulses intact with no bruits No edema Neuro non-focal Skin warm and dry No muscular weakness   ASSESSMENT & PLAN:    1. Chest Pain:  Troponin max around 13 with lateral T wave inversions however cath 02/12/19 with no epicardial CAD. May have been embolic event as she is in chronic afib but INR was 2.1 on admission. TTE with normal EF and no evidence of RWMA or Takatsubo.  Will check myovue given more SSCP continue medical RX  2. Afib: chronic good rate control INR has been Rx she will let us know if she wants to change to DOAC  3. HTN: Well controlled.  Continue current medications and low sodium Dash type diet.   4. HLD  On statin labs with primary   5. AS: moderate by TTE 02/12/19 mean gradient 13 mmHg peak 25 mmHg will update echo given SSCP   Medication Adjustments/Labs and Tests Ordered: Current medicines are reviewed at length with the patient today.  Concerns regarding medicines are outlined above.   Tests Ordered:  Lexiscan myovue  Echo   Medication Changes: No orders of the defined types were placed in this encounter.   Disposition:  F/U in 3 months   Signed, Jenkins Rouge, MD  03/11/2020 9:58 AM    [place]

## 2020-03-11 ENCOUNTER — Other Ambulatory Visit: Payer: Self-pay

## 2020-03-11 ENCOUNTER — Ambulatory Visit (INDEPENDENT_AMBULATORY_CARE_PROVIDER_SITE_OTHER): Payer: Medicare Other | Admitting: Cardiovascular Disease

## 2020-03-11 ENCOUNTER — Ambulatory Visit (INDEPENDENT_AMBULATORY_CARE_PROVIDER_SITE_OTHER): Payer: Medicare Other | Admitting: *Deleted

## 2020-03-11 VITALS — BP 120/70 | HR 60 | Ht 71.0 in | Wt 170.0 lb

## 2020-03-11 DIAGNOSIS — I350 Nonrheumatic aortic (valve) stenosis: Secondary | ICD-10-CM | POA: Diagnosis not present

## 2020-03-11 DIAGNOSIS — Z7901 Long term (current) use of anticoagulants: Secondary | ICD-10-CM

## 2020-03-11 DIAGNOSIS — R079 Chest pain, unspecified: Secondary | ICD-10-CM | POA: Diagnosis not present

## 2020-03-11 DIAGNOSIS — I482 Chronic atrial fibrillation, unspecified: Secondary | ICD-10-CM | POA: Diagnosis not present

## 2020-03-11 LAB — POCT INR: INR: 1.9 — AB (ref 2.0–3.0)

## 2020-03-11 NOTE — Patient Instructions (Signed)
Description   Today take 1.5 tablets then continue on same dosage 1 tablet daily except for 1/2 tablet on Mondays. Recheck in 3 weeks. Call our office if you have any concerns or if you are put on any new medications [phone]

## 2020-03-11 NOTE — Patient Instructions (Addendum)
Medication Instructions:  *If you need a refill on your cardiac medications before your next appointment, please call your pharmacy*  Lab Work: If you have labs (blood work) drawn today and your tests are completely normal, you will receive your results only by: Marland Kitchen MyChart Message (if you have MyChart) OR . A paper copy in the mail If you have any lab test that is abnormal or we need to change your treatment, we will call you to review the results.  Testing/Procedures: Your physician has requested that you have an echocardiogram. Echocardiography is a painless test that uses sound waves to create images of your heart. It provides your doctor with information about the size and shape of your heart and how well your heart's chambers and valves are working. This procedure takes approximately one hour. There are no restrictions for this procedure.  Your physician has requested that you have a lexiscan myoview. For further information please visit [email or web address]. Please follow instruction sheet, as given.  Follow-Up: At [facility], you and your health needs are our priority.  As part of our continuing mission to provide you with exceptional heart care, we have created designated Provider Care Teams.  These Care Teams include your primary Cardiologist (physician) and Advanced Practice Providers (APPs -  Physician Assistants and Nurse Practitioners) who all work together to provide you with the care you need, when you need it.  We recommend signing up for the patient portal called "MyChart".  Sign up information is provided on this After Visit Summary.  MyChart is used to connect with patients for Virtual Visits (Telemedicine).  Patients are able to view lab/test results, encounter notes, upcoming appointments, etc.  Non-urgent messages can be sent to your provider as well.   To learn more about what you can do with MyChart, go to [email or web address].    Your next appointment:   6  month(s)  The format for your next appointment:   In Person  Provider:   You may see Jenkins Rouge, MD or one of the following Advanced Practice Providers on your designated Care Team:    Truitt Merle, NP  Cecilie Kicks, NP  Kathyrn Drown, NP

## 2020-03-15 ENCOUNTER — Other Ambulatory Visit: Payer: Self-pay | Admitting: Family Medicine

## 2020-03-18 DIAGNOSIS — H18413 Arcus senilis, bilateral: Secondary | ICD-10-CM | POA: Diagnosis not present

## 2020-03-18 DIAGNOSIS — Z961 Presence of intraocular lens: Secondary | ICD-10-CM | POA: Diagnosis not present

## 2020-03-18 DIAGNOSIS — I10 Essential (primary) hypertension: Secondary | ICD-10-CM | POA: Diagnosis not present

## 2020-03-18 DIAGNOSIS — S0501XA Injury of conjunctiva and corneal abrasion without foreign body, right eye, initial encounter: Secondary | ICD-10-CM | POA: Diagnosis not present

## 2020-03-22 ENCOUNTER — Telehealth (HOSPITAL_COMMUNITY): Payer: Self-pay | Admitting: *Deleted

## 2020-03-22 ENCOUNTER — Encounter (HOSPITAL_COMMUNITY): Payer: Self-pay | Admitting: *Deleted

## 2020-03-22 NOTE — Telephone Encounter (Signed)
Patient given detailed instructions per Myocardial Perfusion Study Information Sheet for the test on 03/30/2020 at 1015. Patient notified to arrive 15 minutes early and that it is imperative to arrive on time for appointment to keep from having the test rescheduled.  If you need to cancel or reschedule your appointment, please call the office within 24 hours of your appointment. . Patient verbalized understanding.Kaneshia Cater, Lucius Conn letter sent with instructions

## 2020-03-29 ENCOUNTER — Encounter (HOSPITAL_COMMUNITY): Payer: Medicare Other

## 2020-03-30 ENCOUNTER — Ambulatory Visit (INDEPENDENT_AMBULATORY_CARE_PROVIDER_SITE_OTHER): Payer: Medicare Other

## 2020-03-30 ENCOUNTER — Ambulatory Visit (HOSPITAL_COMMUNITY): Payer: Medicare Other | Attending: Cardiology

## 2020-03-30 ENCOUNTER — Ambulatory Visit (HOSPITAL_BASED_OUTPATIENT_CLINIC_OR_DEPARTMENT_OTHER): Payer: Medicare Other

## 2020-03-30 ENCOUNTER — Other Ambulatory Visit: Payer: Self-pay

## 2020-03-30 DIAGNOSIS — I350 Nonrheumatic aortic (valve) stenosis: Secondary | ICD-10-CM | POA: Diagnosis not present

## 2020-03-30 DIAGNOSIS — Z7901 Long term (current) use of anticoagulants: Secondary | ICD-10-CM

## 2020-03-30 DIAGNOSIS — R079 Chest pain, unspecified: Secondary | ICD-10-CM

## 2020-03-30 DIAGNOSIS — I482 Chronic atrial fibrillation, unspecified: Secondary | ICD-10-CM

## 2020-03-30 LAB — POCT INR: INR: 1.7 — AB (ref 2.0–3.0)

## 2020-03-30 LAB — MYOCARDIAL PERFUSION IMAGING
LV dias vol: 62 mL (ref 46–106)
LV sys vol: 19 mL
Peak HR: 91 {beats}/min
Rest HR: 69 {beats}/min
SDS: 5
SRS: 1
SSS: 6
TID: 0.9

## 2020-03-30 LAB — ECHOCARDIOGRAM COMPLETE
Height: 71 in
Weight: 2720 oz

## 2020-03-30 MED ORDER — TECHNETIUM TC 99M TETROFOSMIN IV KIT
10.6000 | PACK | Freq: Once | INTRAVENOUS | Status: AC | PRN
  Administered 2020-03-30: 10.6 via INTRAVENOUS
  Filled 2020-03-30: qty 11

## 2020-03-30 MED ORDER — TECHNETIUM TC 99M TETROFOSMIN IV KIT
32.4000 | PACK | Freq: Once | INTRAVENOUS | Status: AC | PRN
  Administered 2020-03-30: 32.4 via INTRAVENOUS
  Filled 2020-03-30: qty 33

## 2020-03-30 MED ORDER — REGADENOSON 0.4 MG/5ML IV SOLN
0.4000 mg | Freq: Once | INTRAVENOUS | Status: AC
  Administered 2020-03-30: 0.4 mg via INTRAVENOUS

## 2020-03-30 NOTE — Patient Instructions (Signed)
Description   Take 1.5 tablets today, then start taking 1 tablet daily.  Recheck in 3 weeks. Call our office if you have any concerns or if you are put on any new medications [phone]

## 2020-04-08 ENCOUNTER — Other Ambulatory Visit: Payer: Self-pay | Admitting: Cardiovascular Disease

## 2020-04-11 MED ORDER — FUROSEMIDE 40 MG PO TABS: 40.0000 mg | ORAL_TABLET | Freq: Every day | ORAL | 3 refills | Status: DC

## 2020-04-11 MED ORDER — POTASSIUM CHLORIDE CRYS ER 20 MEQ PO TBCR: 20.0000 meq | EXTENDED_RELEASE_TABLET | ORAL | 3 refills | Status: DC

## 2020-04-13 ENCOUNTER — Other Ambulatory Visit: Payer: Self-pay | Admitting: Family Medicine

## 2020-04-13 NOTE — Telephone Encounter (Signed)
Last filled 05/27/2019 Last OV 12/29/2019  Ok to fill?

## 2020-04-20 ENCOUNTER — Other Ambulatory Visit: Payer: Self-pay

## 2020-04-20 ENCOUNTER — Ambulatory Visit (INDEPENDENT_AMBULATORY_CARE_PROVIDER_SITE_OTHER): Payer: Medicare Other | Admitting: *Deleted

## 2020-04-20 DIAGNOSIS — I482 Chronic atrial fibrillation, unspecified: Secondary | ICD-10-CM

## 2020-04-20 DIAGNOSIS — Z7901 Long term (current) use of anticoagulants: Secondary | ICD-10-CM

## 2020-04-20 LAB — POCT INR: INR: 2.4 (ref 2.0–3.0)

## 2020-04-20 NOTE — Patient Instructions (Signed)
Description   Continue taking 1 tablet daily. Recheck in 4 weeks. Call our office if you have any concerns or if you are put on any new medications [phone]

## 2020-05-03 ENCOUNTER — Other Ambulatory Visit: Payer: Self-pay | Admitting: Cardiovascular Disease

## 2020-05-18 ENCOUNTER — Other Ambulatory Visit: Payer: Self-pay

## 2020-05-18 ENCOUNTER — Ambulatory Visit (INDEPENDENT_AMBULATORY_CARE_PROVIDER_SITE_OTHER): Payer: Medicare Other

## 2020-05-18 DIAGNOSIS — I482 Chronic atrial fibrillation, unspecified: Secondary | ICD-10-CM | POA: Diagnosis not present

## 2020-05-18 DIAGNOSIS — Z7901 Long term (current) use of anticoagulants: Secondary | ICD-10-CM

## 2020-05-18 DIAGNOSIS — Z5181 Encounter for therapeutic drug level monitoring: Secondary | ICD-10-CM | POA: Diagnosis not present

## 2020-05-18 LAB — POCT INR: INR: 3 (ref 2.0–3.0)

## 2020-05-18 NOTE — Patient Instructions (Signed)
Continue taking 1 tablet daily. Recheck in 4 weeks. Call our office if you have any concerns or if you are put on any new medications [phone]

## 2020-05-25 ENCOUNTER — Other Ambulatory Visit: Payer: Self-pay

## 2020-05-25 ENCOUNTER — Encounter: Payer: Self-pay | Admitting: Family Medicine

## 2020-05-25 ENCOUNTER — Ambulatory Visit (INDEPENDENT_AMBULATORY_CARE_PROVIDER_SITE_OTHER): Payer: Medicare Other | Admitting: Family Medicine

## 2020-05-25 VITALS — BP 124/62 | HR 115 | Temp 97.8°F | Wt 172.8 lb

## 2020-05-25 DIAGNOSIS — R300 Dysuria: Secondary | ICD-10-CM

## 2020-05-25 DIAGNOSIS — E871 Hypo-osmolality and hyponatremia: Secondary | ICD-10-CM

## 2020-05-25 DIAGNOSIS — I10 Essential (primary) hypertension: Secondary | ICD-10-CM

## 2020-05-25 DIAGNOSIS — I5030 Unspecified diastolic (congestive) heart failure: Secondary | ICD-10-CM

## 2020-05-25 DIAGNOSIS — R079 Chest pain, unspecified: Secondary | ICD-10-CM | POA: Diagnosis not present

## 2020-05-25 DIAGNOSIS — I482 Chronic atrial fibrillation, unspecified: Secondary | ICD-10-CM

## 2020-05-25 LAB — POCT URINALYSIS DIPSTICK
Bilirubin, UA: NEGATIVE
Blood, UA: POSITIVE
Glucose, UA: NEGATIVE
Ketones, UA: NEGATIVE
Leukocytes, UA: NEGATIVE
Nitrite, UA: NEGATIVE
Protein, UA: NEGATIVE
Spec Grav, UA: 1.015 (ref 1.010–1.025)
Urobilinogen, UA: 0.2 E.U./dL
pH, UA: 6 (ref 5.0–8.0)

## 2020-05-25 MED ORDER — ISOSORBIDE MONONITRATE ER 30 MG PO TB24: 30.0000 mg | ORAL_TABLET | Freq: Every day | ORAL | 5 refills | Status: DC

## 2020-05-25 MED ORDER — CIPROFLOXACIN HCL 500 MG PO TABS: 500.0000 mg | ORAL_TABLET | Freq: Two times a day (BID) | ORAL | 0 refills | Status: DC

## 2020-05-25 NOTE — Progress Notes (Signed)
   Subjective:    Patient ID: [name], female    DOB: 08-15-1929, 86 y.o.   MRN: [id number]  HPI Here for several issues. First she has been having more episodes of chest pain in the past few months. These are mild and not associated with SOB. They are in the center of the chest and they do not radiate. She had a cardiac cath in March 2020 and a Lexiscan test in May 2021 which were normal. She has seen Dr. Johnsie Cancel who has given her SL NTG. This always relieves the chest pain. She asks if she could take a longer acting NTG to prevent these pains. Also for the past few days she has had urinary urgency and burning. No fever. She drinks plenty of water.    Review of Systems  Constitutional: Negative.   Respiratory: Negative.   Cardiovascular: Positive for chest pain. Negative for palpitations and leg swelling.  Gastrointestinal: Negative.   Genitourinary: Positive for dysuria, frequency and urgency. Negative for flank pain and hematuria.       Objective:   Physical Exam Constitutional:      Appearance: Normal appearance.  Cardiovascular:     Rate and Rhythm: Normal rate. Rhythm irregular.     Pulses: Normal pulses.     Heart sounds: Normal heart sounds.  Pulmonary:     Effort: Pulmonary effort is normal.     Breath sounds: Normal breath sounds.  Abdominal:     General: Abdomen is flat. Bowel sounds are normal. There is no distension.     Palpations: Abdomen is soft. There is no mass.     Tenderness: There is no abdominal tenderness. There is no right CVA tenderness, left CVA tenderness, guarding or rebound.     Hernia: No hernia is present.  Neurological:     Mental Status: She is alert.           Assessment & Plan:  For the UTI, we will treat with Cipro and culture the sample. For the chest pains, she will try Imdur 30 mg daily. We will also check labs today to follow up her sodium and renal function.  Alysia Penna, MD

## 2020-05-26 ENCOUNTER — Telehealth: Payer: Self-pay | Admitting: *Deleted

## 2020-05-26 LAB — CBC WITH DIFFERENTIAL/PLATELET
Basophils Absolute: 0.1 10*3/uL (ref 0.0–0.1)
Basophils Relative: 0.8 % (ref 0.0–3.0)
Eosinophils Absolute: 0.2 10*3/uL (ref 0.0–0.7)
Eosinophils Relative: 2.7 % (ref 0.0–5.0)
HCT: 37.3 % (ref 36.0–46.0)
Hemoglobin: 12.6 g/dL (ref 12.0–15.0)
Lymphocytes Relative: 19.3 % (ref 12.0–46.0)
Lymphs Abs: 1.3 10*3/uL (ref 0.7–4.0)
MCHC: 33.8 g/dL (ref 30.0–36.0)
MCV: 96.5 fl (ref 78.0–100.0)
Monocytes Absolute: 0.6 10*3/uL (ref 0.1–1.0)
Monocytes Relative: 8.2 % (ref 3.0–12.0)
Neutro Abs: 4.6 10*3/uL (ref 1.4–7.7)
Neutrophils Relative %: 69 % (ref 43.0–77.0)
Platelets: 189 10*3/uL (ref 150.0–400.0)
RBC: 3.87 Mil/uL (ref 3.87–5.11)
RDW: 14.1 % (ref 11.5–15.5)
WBC: 6.7 10*3/uL (ref 4.0–10.5)

## 2020-05-26 LAB — BASIC METABOLIC PANEL
BUN: 21 mg/dL (ref 6–23)
CO2: 26 mEq/L (ref 19–32)
Calcium: 9.9 mg/dL (ref 8.4–10.5)
Chloride: 98 mEq/L (ref 96–112)
Creatinine, Ser: 1.11 mg/dL (ref 0.40–1.20)
GFR: 46.26 mL/min — ABNORMAL LOW (ref >60.00)
Glucose, Bld: 104 mg/dL — ABNORMAL HIGH (ref 70–99)
Potassium: 4.4 mEq/L (ref 3.5–5.1)
Sodium: 135 mEq/L (ref 135–145)

## 2020-05-26 NOTE — Telephone Encounter (Addendum)
Patient called to report she was prescribed Cipro 500mg  BID on yesterday for 7 days. Pt did take both doses of Cipro yesterday. Advised to her that Cipro does interact with Warfarin can cause the INR to increase and we will need to check her INR. She will check with her transportation and see when they can bring her.   Pt is aware that if they cannot bring her tomorrow then we will need to reduce her Warfarin dose since the office is closed on Monday for the holiday.   At 320pm, pt called back and spoke with Abbott Pao, pt stated that her ride can bring her tomorrow at [place] made the pt an appt for tomorrow at 930am.

## 2020-05-27 ENCOUNTER — Other Ambulatory Visit: Payer: Self-pay

## 2020-05-27 ENCOUNTER — Ambulatory Visit (INDEPENDENT_AMBULATORY_CARE_PROVIDER_SITE_OTHER): Payer: Medicare Other | Admitting: Pharmacist

## 2020-05-27 DIAGNOSIS — I482 Chronic atrial fibrillation, unspecified: Secondary | ICD-10-CM | POA: Diagnosis not present

## 2020-05-27 DIAGNOSIS — Z7901 Long term (current) use of anticoagulants: Secondary | ICD-10-CM | POA: Diagnosis not present

## 2020-05-27 LAB — URINE CULTURE
MICRO NUMBER:: 10654435
SPECIMEN QUALITY:: ADEQUATE

## 2020-05-27 LAB — POCT INR: INR: 2.2 (ref 2.0–3.0)

## 2020-05-27 NOTE — Patient Instructions (Signed)
Continue taking 1 tablet daily. Broccoli every other day until you finish Cipro. Recheck in 3 weeks. Call our office if you have any concerns or if you are put on any new medications [phone]

## 2020-06-04 ENCOUNTER — Other Ambulatory Visit: Payer: Self-pay | Admitting: Family Medicine

## 2020-06-09 ENCOUNTER — Other Ambulatory Visit: Payer: Self-pay | Admitting: Cardiovascular Disease

## 2020-06-15 ENCOUNTER — Ambulatory Visit (INDEPENDENT_AMBULATORY_CARE_PROVIDER_SITE_OTHER): Payer: Medicare Other | Admitting: *Deleted

## 2020-06-15 ENCOUNTER — Other Ambulatory Visit: Payer: Self-pay | Admitting: Cardiovascular Disease

## 2020-06-15 ENCOUNTER — Other Ambulatory Visit: Payer: Self-pay

## 2020-06-15 DIAGNOSIS — Z7901 Long term (current) use of anticoagulants: Secondary | ICD-10-CM | POA: Diagnosis not present

## 2020-06-15 DIAGNOSIS — I482 Chronic atrial fibrillation, unspecified: Secondary | ICD-10-CM

## 2020-06-15 LAB — POCT INR: INR: 2.3 (ref 2.0–3.0)

## 2020-06-15 NOTE — Patient Instructions (Signed)
Description   Continue taking Warfarin 1 tablet daily. Recheck in 4 weeks. Call our office if you have any concerns or if you are put on any new medications [phone]    

## 2020-06-16 ENCOUNTER — Other Ambulatory Visit: Payer: Self-pay | Admitting: Family Medicine

## 2020-07-04 ENCOUNTER — Encounter (INDEPENDENT_AMBULATORY_CARE_PROVIDER_SITE_OTHER): Payer: Self-pay | Admitting: Ophthalmology

## 2020-07-04 ENCOUNTER — Other Ambulatory Visit: Payer: Self-pay | Admitting: Cardiovascular Disease

## 2020-07-04 ENCOUNTER — Ambulatory Visit (INDEPENDENT_AMBULATORY_CARE_PROVIDER_SITE_OTHER): Payer: Medicare Other | Admitting: Ophthalmology

## 2020-07-04 ENCOUNTER — Other Ambulatory Visit: Payer: Self-pay

## 2020-07-04 DIAGNOSIS — H35371 Puckering of macula, right eye: Secondary | ICD-10-CM | POA: Diagnosis not present

## 2020-07-04 DIAGNOSIS — H35351 Cystoid macular degeneration, right eye: Secondary | ICD-10-CM | POA: Insufficient documentation

## 2020-07-04 DIAGNOSIS — H353131 Nonexudative age-related macular degeneration, bilateral, early dry stage: Secondary | ICD-10-CM | POA: Diagnosis not present

## 2020-07-04 NOTE — Assessment & Plan Note (Signed)
Bilateral ARMD, no active lesions in either eye.  Stable overall.

## 2020-07-04 NOTE — Progress Notes (Signed)
07/04/2020     CHIEF COMPLAINT Patient presents for Retina Follow Up   HISTORY OF PRESENT ILLNESS: [name] is a 86 y.o. female who presents to the clinic today for:   HPI    Retina Follow Up    Patient presents with  Dry AMD.  In both eyes.  Duration of 2 years.  Since onset it is stable.          Comments    2 year follow up - OCT OU Pt states that she doesn't feel like she sees as well as she used to. No other complaints.       Last edited by Hurman Horn, MD on 07/04/2020 11:05 AM. (History)      Referring physician: Laurey Morale, MD [place],  [place] [id number]  HISTORICAL INFORMATION:   Selected notes from the MEDICAL RECORD NUMBER    Lab Results  Component Value Date   HGBA1C 6.2 03/24/2014     CURRENT MEDICATIONS: Current Outpatient Medications (Ophthalmic Drugs)  Medication Sig  . ketorolac (ACULAR) 0.5 % ophthalmic solution Place 1 drop into the right eye 2 (two) times daily.    No current facility-administered medications for this visit. (Ophthalmic Drugs)   Current Outpatient Medications (Other)  Medication Sig  . acetaminophen (TYLENOL) 500 MG tablet Take 2 tablets (1,000 mg total) by mouth 2 (two) times daily as needed.  . ALPRAZolam (XANAX) 0.25 MG tablet TAKE 1 TABLET BY MOUTH AT BEDTIME AS NEEDED  . atorvastatin (LIPITOR) 20 MG tablet TAKE 1 TABLET BY MOUTH  DAILY  . Calcium Carbonate-Vitamin D (CALTRATE 600+D) 600-400 MG-UNIT per tablet Take 1 tablet by mouth daily.    . carvedilol (COREG) 6.25 MG tablet Take 1 tablet (6.25 mg total) by mouth 2 (two) times daily with a meal.  . cetirizine (ZYRTEC) 10 MG tablet Take 1 tablet (10 mg total) by mouth daily.  Marland Kitchen CLIMARA 0.1 MG/24HR patch Use as directed  . furosemide (LASIX) 40 MG tablet Take 1 tablet (40 mg total) by mouth daily.  . meclizine (ANTIVERT) 25 MG tablet Take 1 tablet (25 mg total) by mouth every 4 (four) hours as needed for dizziness.  . Multiple  Vitamins-Minerals (CENTRUM SILVER PO) Take 1 tablet by mouth daily.    . nitroGLYCERIN (NITROSTAT) 0.4 MG SL tablet PLACE 1 TABLET UNDER THE TONGUE EVERY 5 MINUTES AS NEEDED FOR CHEST PAIN  . nystatin-triamcinolone ointment (MYCOLOG) APPLY TO AFFECTED AREA TWICE A DAY AS NEEDED  . ondansetron (ZOFRAN) 8 MG tablet Take 1 tablet (8 mg total) by mouth every 6 (six) hours as needed for nausea or vomiting.  . phenazopyridine (PYRIDIUM) 200 MG tablet Take 1 tablet (200 mg total) by mouth 3 (three) times daily as needed (urinary burning).  . potassium chloride SA (KLOR-CON) 20 MEQ tablet Take 1 tablet (20 mEq total) by mouth every other day.  . quinapril (ACCUPRIL) 20 MG tablet Take 1 tablet (20 mg total) by mouth at bedtime.  Marland Kitchen spironolactone (ALDACTONE) 25 MG tablet TAKE 1 TABLET BY MOUTH  DAILY  . SYNTHROID 100 MCG tablet TAKE 1 TABLET BY MOUTH  DAILY BEFORE BREAKFAST  . vitamin B-12 (CYANOCOBALAMIN) 1000 MCG tablet Take 1,000 mcg by mouth daily.    Marland Kitchen warfarin (COUMADIN) 5 MG tablet TAKE AS DIRECTED BY  COUMDAIN CLINIC   No current facility-administered medications for this visit. (Other)      REVIEW OF SYSTEMS:    ALLERGIES Allergies  Allergen Reactions  . Codeine Nausea Only    REACTION: nausea    PAST MEDICAL HISTORY Past Medical History:  Diagnosis Date  . Anxiety   . Atrial fibrillation ([place])   . Cancer ([place])   . Cataract    REMOVED BILATERAL  . Chest pain, unspecified   . Chronic low back pain   . Diverticulosis of colon   . History of colon cancer   . History of colonic polyps   . History of colonoscopy   . History of uterine cancer   . Hyperlipidemia   . Hypertension   . Hypothyroid   . Impaired glucose tolerance   . Mitral valve disorder   . Osteoarthritis   . Osteoporosis   . Patent foramen ovale   . Peripheral neuropathy   . Peripheral vascular disease ([place])   . Splenic infarction   . UTI (lower urinary tract infection)   . Venous insufficiency   .  Vitamin B12 deficiency    Past Surgical History:  Procedure Laterality Date  . ANTERIOR AND POSTERIOR VAGINAL REPAIR  03/2006   Dr.Neal  . basal cell skin cancer Moh's surgery    . CATARACT EXTRACTION, BILATERAL    . COLONOSCOPY  05-25-10   per Dr. Henrene Pastor, benign polyps, repeat in 5 yrs   . EYE SURGERY    . hysterectomy for uterine cancer    . left carpal tunnel surgery  02/2007   Dr. Daylene Katayama  . LEFT HEART CATH AND CORONARY ANGIOGRAPHY N/A 02/12/2019   Procedure: LEFT HEART CATH AND CORONARY ANGIOGRAPHY;  Surgeon: Lorretta Harp, MD;  Location: [facility];  Service: Cardiovascular;  Laterality: N/A;  . lumbar laminectomy for spinal stenosis  2006   L3-4  . RETINAL DETACHMENT REPAIR W/ SCLERAL BUCKLE LE Right 03-01-14   per Dr. Zadie Rhine   . right hemicolectomy for colon cancer  1994    FAMILY HISTORY Family History  Problem Relation Age of Onset  . Cancer Mother        deceased age 81  . Heart failure Father        deceased age 51  . Cancer Brother        deceased age 63  . Stroke Sister        and heart problems; deceased age 6  . Heart disease Sister     SOCIAL HISTORY Social History   Tobacco Use  . Smoking status: Never Smoker  . Smokeless tobacco: Never Used  Substance Use Topics  . Alcohol use: Yes    Alcohol/week: 0.0 standard drinks    Comment: occ  . Drug use: No         OPHTHALMIC EXAM:  Base Eye Exam    Visual Acuity (Snellen - Linear)      Right Left   Dist cc 20/40-1 20/20   Dist ph cc NI    Correction: Glasses       Tonometry (Tonopen, 10:45 AM)      Right Left   Pressure 10 10       Pupils      Pupils Dark Light Shape React APD   Right PERRL 4 4 Round Minimal None   Left PERRL 4 4 Round Minimal None       Visual Fields (Counting fingers)      Left Right    Full Full       Extraocular Movement      Right Left    Full Full  Neuro/Psych    Oriented x3: Yes   Mood/Affect: Normal       Dilation    Both eyes:  1.0% Mydriacyl, 2.5% Phenylephrine @ 10:45 AM        Slit Lamp and Fundus Exam    External Exam      Right Left   External Normal Normal       Slit Lamp Exam      Right Left   Lids/Lashes Normal Normal   Conjunctiva/Sclera White and quiet White and quiet   Cornea Clear Clear   Anterior Chamber Deep and quiet Deep and quiet   Iris Round and reactive Round and reactive   Lens Posterior chamber intraocular lens Posterior chamber intraocular lens   Anterior Vitreous Normal Normal       Fundus Exam      Right Left   Posterior Vitreous Vitrectomized Posterior vitreous detachment   Disc Normal Normal   C/D Ratio 0.45 0.3   Macula Retinal pigment epithelial mottling, Hard drusen Hard drusen, no macular thickening, no exudates   Vessels Normal Normal   Periphery Good scleral buckle, good retinal cryopexy, chorioretinal scarring postsurgical repair Normal, no holes or tears          IMAGING AND PROCEDURES  Imaging and Procedures for 07/04/20  OCT, Retina - OU - Both Eyes       Right Eye Quality was good. Scan locations included subfoveal. Central Foveal Thickness: 281. Progression has been stable. Findings include no SRF, retinal drusen .   Left Eye Quality was good. Scan locations included subfoveal. Central Foveal Thickness: 246. Progression has been stable. Findings include retinal drusen , no SRF, normal foveal contour.   Notes Old inner retinal corrugations from prior macular pucker, status post successful membrane peel.  No changes                ASSESSMENT/PLAN:  Early stage nonexudative age-related macular degeneration of both eyes Bilateral ARMD, no active lesions in either eye.  Stable overall.      ICD-10-CM   1. Cystoid macular edema of right eye  H35.351   2. Right epiretinal membrane  H35.371 OCT, Retina - OU - Both Eyes  3. Early stage nonexudative age-related macular degeneration of both eyes  H35.3131 OCT, Retina - OU - Both Eyes     1.  2.  3.  Ophthalmic Meds Ordered this visit:  No orders of the defined types were placed in this encounter.      Return in about 2 years (around 07/04/2022) for DILATE OU, COLOR FP, OCT.  There are no Patient Instructions on file for this visit.   Explained the diagnoses, plan, and follow up with the patient and they expressed understanding.  Patient expressed understanding of the importance of proper follow up care.   Clent Demark [place] M.D. Diseases & Surgery of the Retina and Vitreous Retina & Diabetic [place] 07/04/20     Abbreviations: M myopia (nearsighted); A astigmatism; H hyperopia (farsighted); P presbyopia; Mrx spectacle prescription;  CTL contact lenses; OD right eye; OS left eye; OU both eyes  XT exotropia; ET esotropia; PEK punctate epithelial keratitis; PEE punctate epithelial erosions; DES dry eye syndrome; MGD meibomian gland dysfunction; ATs artificial tears; PFAT's preservative free artificial tears; [place] nuclear sclerotic cataract; PSC posterior subcapsular cataract; ERM epi-retinal membrane; PVD posterior vitreous detachment; RD retinal detachment; DM diabetes mellitus; DR diabetic retinopathy; NPDR non-proliferative diabetic retinopathy; PDR proliferative diabetic retinopathy; CSME clinically significant macular edema; DME diabetic  macular edema; dbh dot blot hemorrhages; CWS cotton wool spot; POAG primary open angle glaucoma; C/D cup-to-disc ratio; HVF humphrey visual field; GVF goldmann visual field; OCT optical coherence tomography; IOP intraocular pressure; BRVO Branch retinal vein occlusion; CRVO central retinal vein occlusion; CRAO central retinal artery occlusion; BRAO branch retinal artery occlusion; RT retinal tear; SB scleral buckle; PPV pars plana vitrectomy; VH Vitreous hemorrhage; PRP panretinal laser photocoagulation; IVK intravitreal kenalog; VMT vitreomacular traction; MH Macular hole;  NVD neovascularization of the disc; NVE neovascularization  elsewhere; AREDS age related eye disease study; ARMD age related macular degeneration; POAG primary open angle glaucoma; EBMD epithelial/anterior basement membrane dystrophy; ACIOL anterior chamber intraocular lens; IOL intraocular lens; PCIOL posterior chamber intraocular lens; Phaco/IOL phacoemulsification with intraocular lens placement; [place] photorefractive keratectomy; LASIK laser assisted in situ keratomileusis; HTN hypertension; DM diabetes mellitus; COPD chronic obstructive pulmonary disease

## 2020-07-12 ENCOUNTER — Other Ambulatory Visit: Payer: Self-pay

## 2020-07-12 ENCOUNTER — Ambulatory Visit (INDEPENDENT_AMBULATORY_CARE_PROVIDER_SITE_OTHER): Payer: Medicare Other | Admitting: *Deleted

## 2020-07-12 DIAGNOSIS — I482 Chronic atrial fibrillation, unspecified: Secondary | ICD-10-CM

## 2020-07-12 DIAGNOSIS — Z7901 Long term (current) use of anticoagulants: Secondary | ICD-10-CM

## 2020-07-12 LAB — POCT INR: INR: 3.4 — AB (ref 2.0–3.0)

## 2020-07-12 NOTE — Patient Instructions (Signed)
Description   Do not take today's dose then continue taking Warfarin 1 tablet daily. Recheck in 3 weeks. Call our office if you have any concerns or if you are put on any new medications [phone]

## 2020-07-17 ENCOUNTER — Other Ambulatory Visit (INDEPENDENT_AMBULATORY_CARE_PROVIDER_SITE_OTHER): Payer: Self-pay | Admitting: Ophthalmology

## 2020-08-02 ENCOUNTER — Other Ambulatory Visit: Payer: Self-pay

## 2020-08-02 ENCOUNTER — Ambulatory Visit (INDEPENDENT_AMBULATORY_CARE_PROVIDER_SITE_OTHER): Payer: Medicare Other | Admitting: *Deleted

## 2020-08-02 DIAGNOSIS — Z5181 Encounter for therapeutic drug level monitoring: Secondary | ICD-10-CM | POA: Diagnosis not present

## 2020-08-02 DIAGNOSIS — I482 Chronic atrial fibrillation, unspecified: Secondary | ICD-10-CM | POA: Diagnosis not present

## 2020-08-02 LAB — POCT INR: INR: 2 (ref 2.0–3.0)

## 2020-08-02 NOTE — Patient Instructions (Signed)
Description   Continue taking Warfarin 1 tablet daily. Recheck in 4 weeks. Call our office if you have any concerns or if you are put on any new medications [phone]

## 2020-08-30 ENCOUNTER — Ambulatory Visit (INDEPENDENT_AMBULATORY_CARE_PROVIDER_SITE_OTHER): Payer: Medicare Other | Admitting: *Deleted

## 2020-08-30 ENCOUNTER — Other Ambulatory Visit: Payer: Self-pay

## 2020-08-30 DIAGNOSIS — I482 Chronic atrial fibrillation, unspecified: Secondary | ICD-10-CM

## 2020-08-30 DIAGNOSIS — Z5181 Encounter for therapeutic drug level monitoring: Secondary | ICD-10-CM | POA: Diagnosis not present

## 2020-08-30 LAB — POCT INR: INR: 2.5 (ref 2.0–3.0)

## 2020-08-30 NOTE — Patient Instructions (Signed)
Description   Continue taking Warfarin 1 tablet daily. Recheck in 5 weeks. Call our office if you have any concerns or if you are put on any new medications [phone]

## 2020-09-05 NOTE — Progress Notes (Signed)
Date:  09/15/2020   ID:  [name], DOB 1929/01/02, MRN [id number]  PCP:  Chloe Morale, MD  Cardiologist:  Chloe Rouge, MD   Electrophysiologist:  None   Evaluation Performed:  Follow-Up Visit  Chief Complaint:  Afib  History of Present Illness:    86 y.o. with HTN, chronic afib , moderate AS, HLD Admitted to hospital in March 2020 with SSCP and mildly elevated troponin. New RBBB that eventually resolved. However cath done 02/12/19 showed normal coronary arteries. EF 60-65% no evidence of Takatsubo DCM. And  aortogram with no dissection AS more mild with mean gradient 14 mmHg. Peak troponin was 12.99. D/C with higher dose lasix 40 mg bid  And d/c aldactone ? Volume overload Rhythm through out was chronic afib. 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 BP a bit low and coreg decreased to 6.25 bid   Updated echo done 03/30/20 showed stable mild to moderate AS but severe MS with mean gradient 21 mmHg and moderate MR  Given her advanced age and valve morphology she is not a candidate for balloon valvullopasty or surgical valve replacement Have focused on beta blocker Rx and diuretics to increase diastolic filling time and lower EDP    Seen by primary 05/25/20 Rx Cipro for UTI and added imdur for her SSCP as SL nitro seems to help   INR;s Rx with no bleeding issues   Long discussion with her and her [place] ( [place] [phone]. Although [name] does Better than most 86 yo's she had declined significantly over the last 5 years with less quality of life. She has non Rx cardiac valve disease. With severe MS.    Past Medical History:  Diagnosis Date  . Anxiety   . Atrial fibrillation ([place])    . Cancer ([place])   . Cataract    REMOVED BILATERAL  . Chest pain, unspecified   . Chronic low back pain   . Diverticulosis of colon   . History of colon cancer   . History of colonic polyps   . History of colonoscopy   . History of uterine cancer   . Hyperlipidemia   . Hypertension   . Hypothyroid   . Impaired glucose tolerance   . Mitral valve disorder   . Osteoarthritis   . Osteoporosis   . Patent foramen ovale   . Peripheral neuropathy   . Peripheral vascular disease ([place])   . Splenic infarction   . UTI (lower urinary tract infection)   . Venous insufficiency   . Vitamin B12 deficiency    Past Surgical History:  Procedure Laterality Date  . ANTERIOR AND POSTERIOR VAGINAL REPAIR  03/2006   Dr.Neal  . basal cell skin cancer Moh's surgery    . CATARACT EXTRACTION, BILATERAL    . COLONOSCOPY  05-25-10   per Dr. Henrene [name], benign polyps, repeat in 5 yrs   . EYE SURGERY    . hysterectomy for uterine cancer    . left carpal tunnel surgery  02/2007   Dr. Daylene [name]  . LEFT HEART CATH AND CORONARY ANGIOGRAPHY N/A 02/12/2019   Procedure: LEFT HEART CATH AND CORONARY  ANGIOGRAPHY;  Surgeon: Chloe Harp, MD;  Location: [facility];  Service: Cardiovascular;  Laterality: N/A;  . lumbar laminectomy for spinal stenosis  2006   L3-4  . RETINAL DETACHMENT REPAIR W/ SCLERAL BUCKLE LE Right 03-01-14   per Dr. Zadie [name]   . right hemicolectomy for colon cancer  1994     Current Meds  Medication Sig  . acetaminophen (TYLENOL) 500 MG tablet Take 2 tablets (1,000 mg total) by mouth 2 (two) times daily as needed.  . ALPRAZolam (XANAX) 0.25 MG tablet TAKE 1 TABLET BY MOUTH AT BEDTIME AS NEEDED  . atorvastatin (LIPITOR) 20 MG tablet TAKE 1 TABLET BY MOUTH  DAILY  . Calcium Carbonate-Vitamin D (CALTRATE 600+D) 600-400 MG-UNIT per tablet Take 1 tablet by mouth daily.    . carvedilol (COREG) 6.25 MG tablet Take 1 tablet (6.25 mg total) by mouth 2 (two) times daily with a meal.  . cetirizine  (ZYRTEC) 10 MG tablet Take 1 tablet (10 mg total) by mouth daily.  Marland Kitchen CLIMARA 0.1 MG/24HR patch Use as directed  . furosemide (LASIX) 40 MG tablet Take 1 tablet (40 mg total) by mouth daily.  Marland Kitchen ketorolac (ACULAR) 0.5 % ophthalmic solution PLACE 1 DROP INTO THE RIGHT EYE TWICE A DAY FOR 30 DAYS  . meclizine (ANTIVERT) 25 MG tablet Take 1 tablet (25 mg total) by mouth every 4 (four) hours as needed for dizziness.  . Multiple Vitamins-Minerals (CENTRUM SILVER PO) Take 1 tablet by mouth daily.    . nitroGLYCERIN (NITROSTAT) 0.4 MG SL tablet PLACE 1 TABLET UNDER THE TONGUE EVERY 5 MINUTES AS NEEDED FOR CHEST PAIN  . nystatin-triamcinolone ointment (MYCOLOG) APPLY TO AFFECTED AREA TWICE A DAY AS NEEDED  . ondansetron (ZOFRAN) 8 MG tablet Take 1 tablet (8 mg total) by mouth every 6 (six) hours as needed for nausea or vomiting.  . phenazopyridine (PYRIDIUM) 200 MG tablet Take 1 tablet (200 mg total) by mouth 3 (three) times daily as needed (urinary burning).  . potassium chloride SA (KLOR-CON) 20 MEQ tablet Take 1 tablet (20 mEq total) by mouth every other day.  . quinapril (ACCUPRIL) 20 MG tablet TAKE 1 TABLET BY MOUTH AT  BEDTIME  . spironolactone (ALDACTONE) 25 MG tablet TAKE 1 TABLET BY MOUTH  DAILY  . SYNTHROID 100 MCG tablet TAKE 1 TABLET BY MOUTH  DAILY BEFORE BREAKFAST  . vitamin B-12 (CYANOCOBALAMIN) 1000 MCG tablet Take 1,000 mcg by mouth daily.    Marland Kitchen warfarin (COUMADIN) 5 MG tablet TAKE AS DIRECTED BY  COUMDAIN CLINIC     Allergies:   Codeine   Social History   Tobacco Use  . Smoking status: Never Smoker  . Smokeless tobacco: Never Used  Substance Use Topics  . Alcohol use: Yes    Alcohol/week: 0.0 standard drinks    Comment: occ  . Drug use: No     Family Hx: The patient's family history includes Cancer in her brother and mother; Heart disease in her sister; Heart failure in her father; Stroke in her sister.  ROS:   Please see the history of present illness.     All other  systems reviewed and are negative.   Prior CV studies:   The following studies were reviewed today:  Echo 02/12/19 Cath 02/12/19  Labs/Other Tests and Data Reviewed:    EKG:  12/07/19 afib nonspecific ST changes lateral T wave inversion   Recent Labs: 12/07/2019: ALT 19 05/25/2020: BUN 21; Creatinine, Ser 1.11; Hemoglobin 12.6; Platelets 189.0;  Potassium 4.4; Sodium 135   Recent Lipid Panel Lab Results  Component Value Date/Time   CHOL 100 02/13/2019 03:09 AM   TRIG 73 02/13/2019 03:09 AM   HDL 37 (L) 02/13/2019 03:09 AM   CHOLHDL 2.7 02/13/2019 03:09 AM   LDLCALC 48 02/13/2019 03:09 AM    Wt Readings from Last 3 Encounters:  09/15/20 171 lb (77.6 kg)  05/25/20 172 lb 12.8 oz (78.4 kg)  03/30/20 170 lb (77.1 kg)     Objective:    Vital Signs:  BP 132/78   Pulse (!) 112   Ht _0  (1.803 m)   Wt 171 lb (77.6 kg)   SpO2 95%   BMI 23.85 kg/m    Affect appropriate Healthy:  appears stated age HEENT: normal Neck supple with no adenopathy JVP normal no bruits no thyromegaly Lungs clear with no wheezing and good diaphragmatic motion Heart:  S1/S2 AS murmur, no rub, gallop or click PMI normal Abdomen: benighn, BS positve, no tenderness, no AAA no bruit.  No HSM or HJR Distal pulses intact with no bruits No edema Neuro non-focal Skin warm and dry No muscular weakness   ASSESSMENT & PLAN:    1. Chest Pain:  Troponin max around 13 with lateral T wave inversions however cath 02/12/19 with no epicardial CAD. May have been embolic event as she is in chronic afib but INR was 2.1 on admission. TTE with normal EF and no evidence of RWMA or Takatsubo.  F/U myovue 03/30/20 normal no ischemia EF 69% observe 2. Afib: chronic good rate control INR has been Rx she will let us know if she wants to change to DOAC  3. HTN: Well controlled.  Continue current medications and low sodium Dash type diet.   4. HLD  On statin labs with primary   5. AS/MS:  Reviewed echo done 03/30/20 EF  60-65% mild AR, mild to moderate AS mean gradient 15 peak 24 mmHg stable  MV degenertaive with moderate MR with severe MAC and severe MS mean gradient 21 mmHg  6. Also long discussion regarding her wish for DNR status and change in health care power of attorney DNR form filled out and given to patient   Decrease Quniapril to 10 mg a day and increase coreg back to 12.5 mg bid. I asked her to make sure nobody/primary Changes doses of her beta blocker or diuretic without talking to me first    Medication Adjustments/Labs and Tests Ordered: Current medicines are reviewed at length with the patient today.  Concerns regarding medicines are outlined above.   Tests Ordered:  None   Medication Changes: See above   Disposition:  F/U in 6 months   Signed, Chloe Rouge, MD  09/15/2020 2:12 PM    [facility]

## 2020-09-15 ENCOUNTER — Other Ambulatory Visit: Payer: Self-pay

## 2020-09-15 ENCOUNTER — Encounter: Payer: Self-pay | Admitting: Cardiovascular Disease

## 2020-09-15 ENCOUNTER — Ambulatory Visit (INDEPENDENT_AMBULATORY_CARE_PROVIDER_SITE_OTHER): Payer: Medicare Other | Admitting: Cardiovascular Disease

## 2020-09-15 VITALS — BP 132/78 | HR 112 | Ht 71.0 in | Wt 171.0 lb

## 2020-09-15 DIAGNOSIS — I482 Chronic atrial fibrillation, unspecified: Secondary | ICD-10-CM | POA: Diagnosis not present

## 2020-09-15 DIAGNOSIS — I342 Nonrheumatic mitral (valve) stenosis: Secondary | ICD-10-CM

## 2020-09-15 MED ORDER — QUINAPRIL HCL 10 MG PO TABS: 10.0000 mg | ORAL_TABLET | Freq: Every day | ORAL | 3 refills | Status: DC

## 2020-09-15 MED ORDER — CARVEDILOL 12.5 MG PO TABS
12.5000 mg | ORAL_TABLET | Freq: Two times a day (BID) | ORAL | 3 refills | Status: DC
Start: 2020-09-15 — End: 2020-11-11

## 2020-09-15 NOTE — Patient Instructions (Addendum)
Medication Instructions:  Your physician has recommended you make the following change in your medication:  1- Decrease Quinapril to 10 mg by mouth daily 2- Increase Carvedilol to 12.5 mg by mouth twice daily  *If you need a refill on your cardiac medications before your next appointment, please call your pharmacy*  Lab Work: If you have labs (blood work) drawn today and your tests are completely normal, you will receive your results only by: Marland Kitchen MyChart Message (if you have MyChart) OR . A paper copy in the mail If you have any lab test that is abnormal or we need to change your treatment, we will call you to review the results.  Testing/Procedures: None ordered today.  Follow-Up: At [facility], you and your health needs are our priority.  As part of our continuing mission to provide you with exceptional heart care, we have created designated Provider Care Teams.  These Care Teams include your primary Cardiologist (physician) and Advanced Practice Providers (APPs -  Physician Assistants and Nurse Practitioners) who all work together to provide you with the care you need, when you need it.  We recommend signing up for the patient portal called "MyChart".  Sign up information is provided on this After Visit Summary.  MyChart is used to connect with patients for Virtual Visits (Telemedicine).  Patients are able to view lab/test results, encounter notes, upcoming appointments, etc.  Non-urgent messages can be sent to your provider as well.   To learn more about what you can do with MyChart, go to [email or web address].    Your next appointment:   6 month(s)  The format for your next appointment:   In Person  Provider:   You may see Jenkins Rouge, MD or one of the following Advanced Practice Providers on your designated Care Team:    Truitt Merle, NP  Cecilie Kicks, NP  Kathyrn Drown, NP

## 2020-10-04 ENCOUNTER — Ambulatory Visit (INDEPENDENT_AMBULATORY_CARE_PROVIDER_SITE_OTHER): Payer: Medicare Other

## 2020-10-04 ENCOUNTER — Other Ambulatory Visit: Payer: Self-pay

## 2020-10-04 DIAGNOSIS — Z7901 Long term (current) use of anticoagulants: Secondary | ICD-10-CM | POA: Diagnosis not present

## 2020-10-04 DIAGNOSIS — I482 Chronic atrial fibrillation, unspecified: Secondary | ICD-10-CM | POA: Diagnosis not present

## 2020-10-04 LAB — POCT INR: INR: 2.5 (ref 2.0–3.0)

## 2020-10-04 NOTE — Patient Instructions (Signed)
Description   Continue taking Warfarin 1 tablet daily. Recheck in 6 weeks. Call our office if you have any concerns or if you are put on any new medications [phone]

## 2020-10-26 ENCOUNTER — Other Ambulatory Visit: Payer: Self-pay | Admitting: Cardiovascular Disease

## 2020-10-28 ENCOUNTER — Other Ambulatory Visit: Payer: Self-pay | Admitting: Family Medicine

## 2020-10-29 DIAGNOSIS — N343 Urethral syndrome, unspecified: Secondary | ICD-10-CM | POA: Diagnosis not present

## 2020-10-31 ENCOUNTER — Telehealth: Payer: Self-pay | Admitting: *Deleted

## 2020-10-31 NOTE — Telephone Encounter (Signed)
Pt called and stated that she was started on Cephalexin and will be done taking it on 12/14. Informed pt that should not interfere with her warfarin and that we will keep her appointment that is scheduled on 12/15 to have her INR checked. Appointment scheduled for 12/15 at 3:30 at the coumadin clinic.

## 2020-11-01 ENCOUNTER — Inpatient Hospital Stay (HOSPITAL_COMMUNITY)
Admission: EM | Admit: 2020-11-01 | Discharge: 2020-11-11 | DRG: 314 | Disposition: A | Discharge status: Hospice | Payer: Medicare Other | Attending: Internal Medicine | Admitting: Internal Medicine

## 2020-11-01 ENCOUNTER — Encounter (HOSPITAL_COMMUNITY): Payer: Self-pay | Admitting: Emergency Medicine

## 2020-11-01 ENCOUNTER — Emergency Department (HOSPITAL_COMMUNITY): Payer: Medicare Other

## 2020-11-01 ENCOUNTER — Other Ambulatory Visit: Payer: Self-pay

## 2020-11-01 DIAGNOSIS — I130 Hypertensive heart and chronic kidney disease with heart failure and stage 1 through stage 4 chronic kidney disease, or unspecified chronic kidney disease: Secondary | ICD-10-CM | POA: Diagnosis present

## 2020-11-01 DIAGNOSIS — N390 Urinary tract infection, site not specified: Secondary | ICD-10-CM | POA: Diagnosis present

## 2020-11-01 DIAGNOSIS — R110 Nausea: Secondary | ICD-10-CM | POA: Diagnosis not present

## 2020-11-01 DIAGNOSIS — I428 Other cardiomyopathies: Secondary | ICD-10-CM | POA: Diagnosis present

## 2020-11-01 DIAGNOSIS — Z66 Do not resuscitate: Secondary | ICD-10-CM | POA: Diagnosis present

## 2020-11-01 DIAGNOSIS — N179 Acute kidney failure, unspecified: Secondary | ICD-10-CM | POA: Diagnosis not present

## 2020-11-01 DIAGNOSIS — M545 Low back pain, unspecified: Secondary | ICD-10-CM | POA: Diagnosis present

## 2020-11-01 DIAGNOSIS — Z8542 Personal history of malignant neoplasm of other parts of uterus: Secondary | ICD-10-CM

## 2020-11-01 DIAGNOSIS — Z90710 Acquired absence of both cervix and uterus: Secondary | ICD-10-CM

## 2020-11-01 DIAGNOSIS — E440 Moderate protein-calorie malnutrition: Secondary | ICD-10-CM | POA: Insufficient documentation

## 2020-11-01 DIAGNOSIS — R1310 Dysphagia, unspecified: Secondary | ICD-10-CM | POA: Diagnosis not present

## 2020-11-01 DIAGNOSIS — E785 Hyperlipidemia, unspecified: Secondary | ICD-10-CM | POA: Diagnosis not present

## 2020-11-01 DIAGNOSIS — D696 Thrombocytopenia, unspecified: Secondary | ICD-10-CM | POA: Diagnosis present

## 2020-11-01 DIAGNOSIS — I5082 Biventricular heart failure: Secondary | ICD-10-CM | POA: Diagnosis present

## 2020-11-01 DIAGNOSIS — R071 Chest pain on breathing: Secondary | ICD-10-CM | POA: Diagnosis not present

## 2020-11-01 DIAGNOSIS — Z7901 Long term (current) use of anticoagulants: Secondary | ICD-10-CM

## 2020-11-01 DIAGNOSIS — I4819 Other persistent atrial fibrillation: Secondary | ICD-10-CM | POA: Diagnosis present

## 2020-11-01 DIAGNOSIS — I509 Heart failure, unspecified: Secondary | ICD-10-CM

## 2020-11-01 DIAGNOSIS — E039 Hypothyroidism, unspecified: Secondary | ICD-10-CM | POA: Diagnosis present

## 2020-11-01 DIAGNOSIS — R079 Chest pain, unspecified: Secondary | ICD-10-CM | POA: Diagnosis not present

## 2020-11-01 DIAGNOSIS — Z85038 Personal history of other malignant neoplasm of large intestine: Secondary | ICD-10-CM

## 2020-11-01 DIAGNOSIS — D649 Anemia, unspecified: Secondary | ICD-10-CM | POA: Diagnosis present

## 2020-11-01 DIAGNOSIS — Z885 Allergy status to narcotic agent status: Secondary | ICD-10-CM

## 2020-11-01 DIAGNOSIS — E538 Deficiency of other specified B group vitamins: Secondary | ICD-10-CM | POA: Diagnosis present

## 2020-11-01 DIAGNOSIS — I4891 Unspecified atrial fibrillation: Secondary | ICD-10-CM | POA: Diagnosis not present

## 2020-11-01 DIAGNOSIS — K761 Chronic passive congestion of liver: Secondary | ICD-10-CM | POA: Diagnosis present

## 2020-11-01 DIAGNOSIS — Z515 Encounter for palliative care: Secondary | ICD-10-CM | POA: Diagnosis not present

## 2020-11-01 DIAGNOSIS — J90 Pleural effusion, not elsewhere classified: Secondary | ICD-10-CM | POA: Diagnosis not present

## 2020-11-01 DIAGNOSIS — N1831 Chronic kidney disease, stage 3a: Secondary | ICD-10-CM | POA: Diagnosis present

## 2020-11-01 DIAGNOSIS — Z87440 Personal history of urinary (tract) infections: Secondary | ICD-10-CM

## 2020-11-01 DIAGNOSIS — J9621 Acute and chronic respiratory failure with hypoxia: Secondary | ICD-10-CM | POA: Diagnosis not present

## 2020-11-01 DIAGNOSIS — K7689 Other specified diseases of liver: Secondary | ICD-10-CM | POA: Diagnosis not present

## 2020-11-01 DIAGNOSIS — Z8616 Personal history of COVID-19: Secondary | ICD-10-CM

## 2020-11-01 DIAGNOSIS — I083 Combined rheumatic disorders of mitral, aortic and tricuspid valves: Secondary | ICD-10-CM | POA: Diagnosis present

## 2020-11-01 DIAGNOSIS — I2729 Other secondary pulmonary hypertension: Secondary | ICD-10-CM | POA: Diagnosis present

## 2020-11-01 DIAGNOSIS — I309 Acute pericarditis, unspecified: Principal | ICD-10-CM | POA: Diagnosis present

## 2020-11-01 DIAGNOSIS — Z85828 Personal history of other malignant neoplasm of skin: Secondary | ICD-10-CM

## 2020-11-01 DIAGNOSIS — I4811 Longstanding persistent atrial fibrillation: Secondary | ICD-10-CM | POA: Diagnosis not present

## 2020-11-01 DIAGNOSIS — R0902 Hypoxemia: Secondary | ICD-10-CM | POA: Diagnosis not present

## 2020-11-01 DIAGNOSIS — R109 Unspecified abdominal pain: Secondary | ICD-10-CM | POA: Diagnosis not present

## 2020-11-01 DIAGNOSIS — L819 Disorder of pigmentation, unspecified: Secondary | ICD-10-CM | POA: Diagnosis present

## 2020-11-01 DIAGNOSIS — K3189 Other diseases of stomach and duodenum: Secondary | ICD-10-CM | POA: Diagnosis not present

## 2020-11-01 DIAGNOSIS — I050 Rheumatic mitral stenosis: Secondary | ICD-10-CM | POA: Diagnosis not present

## 2020-11-01 DIAGNOSIS — R7402 Elevation of levels of lactic acid dehydrogenase (LDH): Secondary | ICD-10-CM | POA: Diagnosis present

## 2020-11-01 DIAGNOSIS — J9811 Atelectasis: Secondary | ICD-10-CM | POA: Diagnosis not present

## 2020-11-01 DIAGNOSIS — J811 Chronic pulmonary edema: Secondary | ICD-10-CM | POA: Diagnosis not present

## 2020-11-01 DIAGNOSIS — I5031 Acute diastolic (congestive) heart failure: Secondary | ICD-10-CM | POA: Diagnosis present

## 2020-11-01 DIAGNOSIS — Z6825 Body mass index (BMI) 25.0-25.9, adult: Secondary | ICD-10-CM

## 2020-11-01 DIAGNOSIS — Z8249 Family history of ischemic heart disease and other diseases of the circulatory system: Secondary | ICD-10-CM

## 2020-11-01 DIAGNOSIS — R7302 Impaired glucose tolerance (oral): Secondary | ICD-10-CM | POA: Diagnosis present

## 2020-11-01 DIAGNOSIS — Z86010 Personal history of colonic polyps: Secondary | ICD-10-CM

## 2020-11-01 DIAGNOSIS — I5030 Unspecified diastolic (congestive) heart failure: Secondary | ICD-10-CM | POA: Diagnosis not present

## 2020-11-01 DIAGNOSIS — G8929 Other chronic pain: Secondary | ICD-10-CM | POA: Diagnosis present

## 2020-11-01 DIAGNOSIS — I50810 Right heart failure, unspecified: Secondary | ICD-10-CM | POA: Diagnosis not present

## 2020-11-01 DIAGNOSIS — E875 Hyperkalemia: Secondary | ICD-10-CM | POA: Diagnosis present

## 2020-11-01 DIAGNOSIS — I214 Non-ST elevation (NSTEMI) myocardial infarction: Secondary | ICD-10-CM | POA: Diagnosis not present

## 2020-11-01 DIAGNOSIS — Z79899 Other long term (current) drug therapy: Secondary | ICD-10-CM

## 2020-11-01 DIAGNOSIS — I272 Pulmonary hypertension, unspecified: Secondary | ICD-10-CM | POA: Diagnosis not present

## 2020-11-01 DIAGNOSIS — G629 Polyneuropathy, unspecified: Secondary | ICD-10-CM | POA: Diagnosis present

## 2020-11-01 DIAGNOSIS — I342 Nonrheumatic mitral (valve) stenosis: Secondary | ICD-10-CM | POA: Diagnosis not present

## 2020-11-01 DIAGNOSIS — Q399 Congenital malformation of esophagus, unspecified: Secondary | ICD-10-CM | POA: Diagnosis not present

## 2020-11-01 DIAGNOSIS — I872 Venous insufficiency (chronic) (peripheral): Secondary | ICD-10-CM | POA: Diagnosis present

## 2020-11-01 DIAGNOSIS — Z7189 Other specified counseling: Secondary | ICD-10-CM | POA: Diagnosis not present

## 2020-11-01 DIAGNOSIS — I482 Chronic atrial fibrillation, unspecified: Secondary | ICD-10-CM | POA: Diagnosis not present

## 2020-11-01 DIAGNOSIS — I3139 Other pericardial effusion (noninflammatory): Secondary | ICD-10-CM

## 2020-11-01 DIAGNOSIS — R112 Nausea with vomiting, unspecified: Secondary | ICD-10-CM | POA: Diagnosis not present

## 2020-11-01 DIAGNOSIS — I110 Hypertensive heart disease with heart failure: Secondary | ICD-10-CM | POA: Diagnosis not present

## 2020-11-01 DIAGNOSIS — D631 Anemia in chronic kidney disease: Secondary | ICD-10-CM | POA: Diagnosis not present

## 2020-11-01 DIAGNOSIS — R54 Age-related physical debility: Secondary | ICD-10-CM | POA: Diagnosis present

## 2020-11-01 DIAGNOSIS — F419 Anxiety disorder, unspecified: Secondary | ICD-10-CM | POA: Diagnosis present

## 2020-11-01 DIAGNOSIS — I300 Acute nonspecific idiopathic pericarditis: Secondary | ICD-10-CM | POA: Diagnosis not present

## 2020-11-01 DIAGNOSIS — I10 Essential (primary) hypertension: Secondary | ICD-10-CM | POA: Diagnosis present

## 2020-11-01 DIAGNOSIS — E739 Lactose intolerance, unspecified: Secondary | ICD-10-CM | POA: Diagnosis present

## 2020-11-01 DIAGNOSIS — I4821 Permanent atrial fibrillation: Secondary | ICD-10-CM | POA: Diagnosis not present

## 2020-11-01 DIAGNOSIS — T502X5A Adverse effect of carbonic-anhydrase inhibitors, benzothiadiazides and other diuretics, initial encounter: Secondary | ICD-10-CM | POA: Diagnosis present

## 2020-11-01 DIAGNOSIS — I319 Disease of pericardium, unspecified: Secondary | ICD-10-CM

## 2020-11-01 DIAGNOSIS — I313 Pericardial effusion (noninflammatory): Secondary | ICD-10-CM

## 2020-11-01 DIAGNOSIS — Z79890 Hormone replacement therapy (postmenopausal): Secondary | ICD-10-CM

## 2020-11-01 DIAGNOSIS — R0602 Shortness of breath: Secondary | ICD-10-CM | POA: Diagnosis not present

## 2020-11-01 DIAGNOSIS — E871 Hypo-osmolality and hyponatremia: Secondary | ICD-10-CM | POA: Diagnosis present

## 2020-11-01 DIAGNOSIS — Z20822 Contact with and (suspected) exposure to covid-19: Secondary | ICD-10-CM | POA: Diagnosis not present

## 2020-11-01 DIAGNOSIS — I252 Old myocardial infarction: Secondary | ICD-10-CM

## 2020-11-01 DIAGNOSIS — I517 Cardiomegaly: Secondary | ICD-10-CM | POA: Diagnosis not present

## 2020-11-01 DIAGNOSIS — Z809 Family history of malignant neoplasm, unspecified: Secondary | ICD-10-CM

## 2020-11-01 DIAGNOSIS — I739 Peripheral vascular disease, unspecified: Secondary | ICD-10-CM | POA: Diagnosis present

## 2020-11-01 DIAGNOSIS — M1990 Unspecified osteoarthritis, unspecified site: Secondary | ICD-10-CM | POA: Diagnosis present

## 2020-11-01 DIAGNOSIS — M810 Age-related osteoporosis without current pathological fracture: Secondary | ICD-10-CM | POA: Diagnosis present

## 2020-11-01 DIAGNOSIS — Z9049 Acquired absence of other specified parts of digestive tract: Secondary | ICD-10-CM

## 2020-11-01 LAB — I-STAT CHEM 8, ED
BUN: 33 mg/dL — ABNORMAL HIGH (ref 8–23)
Calcium, Ion: 1.23 mmol/L (ref 1.15–1.40)
Chloride: 95 mmol/L — ABNORMAL LOW (ref 98–111)
Creatinine, Ser: 1 mg/dL (ref 0.44–1.00)
Glucose, Bld: 136 mg/dL — ABNORMAL HIGH (ref 70–99)
HCT: 37 % (ref 36.0–46.0)
Hemoglobin: 12.6 g/dL (ref 12.0–15.0)
Potassium: 5.5 mmol/L — ABNORMAL HIGH (ref 3.5–5.1)
Sodium: 126 mmol/L — ABNORMAL LOW (ref 135–145)
TCO2: 23 mmol/L (ref 22–32)

## 2020-11-01 LAB — HEPATIC FUNCTION PANEL
ALT: 16 U/L (ref 0–44)
AST: 22 U/L (ref 15–41)
Albumin: 3.6 g/dL (ref 3.5–5.0)
Alkaline Phosphatase: 94 U/L (ref 38–126)
Bilirubin, Direct: 1.2 mg/dL — ABNORMAL HIGH (ref 0.0–0.2)
Indirect Bilirubin: 2 mg/dL — ABNORMAL HIGH (ref 0.3–0.9)
Total Bilirubin: 3.2 mg/dL — ABNORMAL HIGH (ref 0.3–1.2)
Total Protein: 6.9 g/dL (ref 6.5–8.1)

## 2020-11-01 LAB — BASIC METABOLIC PANEL
Anion gap: 10 (ref 5–15)
BUN: 34 mg/dL — ABNORMAL HIGH (ref 8–23)
CO2: 20 mmol/L — ABNORMAL LOW (ref 22–32)
Calcium: 9.2 mg/dL (ref 8.9–10.3)
Chloride: 95 mmol/L — ABNORMAL LOW (ref 98–111)
Creatinine, Ser: 1.04 mg/dL — ABNORMAL HIGH (ref 0.44–1.00)
GFR, Estimated: 51 mL/min — ABNORMAL LOW (ref >60)
Glucose, Bld: 128 mg/dL — ABNORMAL HIGH (ref 70–99)
Potassium: 5.7 mmol/L — ABNORMAL HIGH (ref 3.5–5.1)
Sodium: 125 mmol/L — ABNORMAL LOW (ref 135–145)

## 2020-11-01 LAB — CBC
HCT: 37.2 % (ref 36.0–46.0)
Hemoglobin: 12 g/dL (ref 12.0–15.0)
MCH: 32.3 pg (ref 26.0–34.0)
MCHC: 32.3 g/dL (ref 30.0–36.0)
MCV: 100 fL (ref 80.0–100.0)
Platelets: 135 10*3/uL — ABNORMAL LOW (ref 150–400)
RBC: 3.72 MIL/uL — ABNORMAL LOW (ref 3.87–5.11)
RDW: 13 % (ref 11.5–15.5)
WBC: 12.6 10*3/uL — ABNORMAL HIGH (ref 4.0–10.5)
nRBC: 0 % (ref 0.0–0.2)

## 2020-11-01 LAB — BRAIN NATRIURETIC PEPTIDE: B Natriuretic Peptide: 672 pg/mL — ABNORMAL HIGH (ref 0.0–100.0)

## 2020-11-01 LAB — LIPASE, BLOOD: Lipase: 25 U/L (ref 11–51)

## 2020-11-01 LAB — RESP PANEL BY RT-PCR (FLU A&B, COVID) ARPGX2
Influenza A by PCR: NEGATIVE
Influenza B by PCR: NEGATIVE
SARS Coronavirus 2 by RT PCR: NEGATIVE

## 2020-11-01 LAB — PROTIME-INR
INR: 2.8 — ABNORMAL HIGH (ref 0.8–1.2)
Prothrombin Time: 28.8 seconds — ABNORMAL HIGH (ref 11.4–15.2)

## 2020-11-01 LAB — TROPONIN I (HIGH SENSITIVITY)
Troponin I (High Sensitivity): 9 ng/L (ref <18)
Troponin I (High Sensitivity): 10 ng/L (ref <18)

## 2020-11-01 MED ORDER — FENTANYL CITRATE (PF) 100 MCG/2ML IJ SOLN: 25.0000 ug | INTRAMUSCULAR | Status: DC | PRN

## 2020-11-01 MED ORDER — VITAMIN B-12 1000 MCG PO TABS
1000.0000 ug | ORAL_TABLET | Freq: Every day | ORAL | Status: DC
  Administered 2020-11-02 – 2020-11-09 (×8): 1000 ug via ORAL
  Filled 2020-11-01 (×9): qty 1

## 2020-11-01 MED ORDER — ATORVASTATIN CALCIUM 10 MG PO TABS
20.0000 mg | ORAL_TABLET | Freq: Every day | ORAL | Status: DC
  Administered 2020-11-02 – 2020-11-07 (×6): 20 mg via ORAL
  Filled 2020-11-01 (×7): qty 2

## 2020-11-01 MED ORDER — ONDANSETRON HCL 4 MG/2ML IJ SOLN
4.0000 mg | Freq: Once | INTRAMUSCULAR | Status: AC
  Administered 2020-11-01: 4 mg via INTRAVENOUS
  Filled 2020-11-01: qty 2

## 2020-11-01 MED ORDER — FUROSEMIDE 10 MG/ML IJ SOLN
20.0000 mg | Freq: Once | INTRAMUSCULAR | Status: AC
  Administered 2020-11-01: 20 mg via INTRAVENOUS
  Filled 2020-11-01: qty 2

## 2020-11-01 MED ORDER — LEVOTHYROXINE SODIUM 100 MCG PO TABS
100.0000 ug | ORAL_TABLET | Freq: Every day | ORAL | Status: DC
  Administered 2020-11-02 – 2020-11-10 (×8): 100 ug via ORAL
  Filled 2020-11-01 (×9): qty 1

## 2020-11-01 MED ORDER — FENTANYL CITRATE (PF) 100 MCG/2ML IJ SOLN
50.0000 ug | Freq: Once | INTRAMUSCULAR | Status: AC
  Administered 2020-11-01: 50 ug via INTRAVENOUS
  Filled 2020-11-01: qty 2

## 2020-11-01 MED ORDER — IOHEXOL 350 MG/ML SOLN
100.0000 mL | Freq: Once | INTRAVENOUS | Status: AC | PRN
  Administered 2020-11-01: 100 mL via INTRAVENOUS

## 2020-11-01 NOTE — ED Provider Notes (Signed)
[place] EMERGENCY DEPARTMENT Provider Note   CSN: [id number] Arrival date & time: 11/01/20  1640     History Chief Complaint  Patient presents with  . Chest Pain  . Shortness of Breath  . Nausea     [name] is a 86 y.o. female history of A. fib on Coumadin, hyperlipidemia, hypertension who presented with chest pain and shortness of breath and nausea.  Patient has nausea and chest pain intermittently for the last week or so.  Patient went to urgent care on Saturday and had a UA that is positive for UTI.  She was put on antibiotics.  She states that since that she has been having worsening myalgias.  She also has some chest pain as well.  She states that she feels short of breath.  She states that the pain got worse today around 2 pm. Took nitro and had persistent pain so came for evaluation.   The history is provided by the patient.       Past Medical History:  Diagnosis Date  . Anxiety   . Atrial fibrillation ([place])   . Cancer ([place])   . Cataract    REMOVED BILATERAL  . Chest pain, unspecified   . Chronic low back pain   . Diverticulosis of colon   . History of colon cancer   . History of colonic polyps   . History of colonoscopy   . History of uterine cancer   . Hyperlipidemia   . Hypertension   . Hypothyroid   . Impaired glucose tolerance   . Mitral valve disorder   . Osteoarthritis   . Osteoporosis   . Patent foramen ovale   . Peripheral neuropathy   . Peripheral vascular disease ([place])   . Splenic infarction   . UTI (lower urinary tract infection)   . Venous insufficiency   . Vitamin B12 deficiency     Patient Active Problem List   Diagnosis Date Noted  . Cystoid macular edema of right eye 07/04/2020  . Right epiretinal membrane 07/04/2020  . Early stage nonexudative age-related macular degeneration of both eyes 07/04/2020  . Hyponatremia 05/25/2020  . COVID-19 virus infection 12/08/2019  . NSTEMI (non-ST elevated myocardial  infarction) ([place]) 02/12/2019  . Chest pain of uncertain etiology 29/52/8413  . Sore throat 10/30/2018  . Fatigue 10/30/2018  . Dysuria 10/30/2018  . Hypoxemia 01/16/2018  . Lower respiratory infection 01/16/2018  . History of colon cancer in adulthood 01/16/2018  . Diastolic congestive heart failure with preserved left ventricular function, NYHA class 2 ([place]) 01/16/2018  . SBO (small bowel obstruction) ([place]) 04/02/2017  . CAP (community acquired pneumonia) 02/24/2016  . Fever blister 02/24/2016  . Bilateral pneumonia 02/17/2016  . Acute respiratory failure with hypercapnia ([place]) 02/17/2016  . Acute diastolic heart failure ([place])   . Dehydration 12/15/2015  . Sepsis ([place]) 12/15/2015  . Enteritis 12/15/2015  . Diarrhea   . Charcot's joint of left foot 11/24/2015  . Vasculitis ([place]) 03/11/2015  . Aortic stenosis 09/14/2013  . Long term current use of anticoagulant 01/09/2011  . COLONIC POLYPS 10/29/2010  . DIVERTICULOSIS OF COLON 10/29/2010  . ABNORMAL CV (STRESS) TEST 04/20/2010  . CHEST PAIN UNSPECIFIED 04/17/2010  . B12 deficiency 12/23/2008  . IMPAIRED GLUCOSE TOLERANCE 12/23/2008  . PERIPHERAL NEUROPATHY 06/22/2008  . SPLENIC INFARCTION 02/20/2008  . Anxiety state 02/20/2008  . Moderate mitral regurgitation 02/20/2008  . PERIPHERAL VASCULAR DISEASE 02/20/2008  . Venous (peripheral) insufficiency 02/20/2008  . URINARY TRACT INFECTION  02/20/2008  . Osteoarthritis 02/20/2008  . OSTEOPOROSIS 02/20/2008  . UTERINE CANCER, HX OF 02/20/2008  . Malignant neoplasm of colon ([place]) 01/13/2008  . Hypothyroidism 01/13/2008  . Hyperlipemia 01/13/2008  . Essential hypertension 01/13/2008  . ATRIAL FIBRILLATION 01/13/2008  . LOW BACK PAIN, CHRONIC 01/13/2008    Past Surgical History:  Procedure Laterality Date  . ANTERIOR AND POSTERIOR VAGINAL REPAIR  03/2006   Dr.Neal  . basal cell skin cancer Moh's surgery    . CATARACT EXTRACTION, BILATERAL    . COLONOSCOPY  05-25-10   per Dr.  Henrene Pastor, benign polyps, repeat in 5 yrs   . EYE SURGERY    . hysterectomy for uterine cancer    . left carpal tunnel surgery  02/2007   Dr. Daylene Katayama  . LEFT HEART CATH AND CORONARY ANGIOGRAPHY N/A 02/12/2019   Procedure: LEFT HEART CATH AND CORONARY ANGIOGRAPHY;  Surgeon: Lorretta Harp, MD;  Location: [facility];  Service: Cardiovascular;  Laterality: N/A;  . lumbar laminectomy for spinal stenosis  2006   L3-4  . RETINAL DETACHMENT REPAIR W/ SCLERAL BUCKLE LE Right 03-01-14   per Dr. Zadie Rhine   . right hemicolectomy for colon cancer  1994     OB History   No obstetric history on file.     Family History  Problem Relation Age of Onset  . Cancer Mother        deceased age 10  . Heart failure Father        deceased age 74  . Cancer Brother        deceased age 70  . Stroke Sister        and heart problems; deceased age 33  . Heart disease Sister     Social History   Tobacco Use  . Smoking status: Never Smoker  . Smokeless tobacco: Never Used  Substance Use Topics  . Alcohol use: Yes    Alcohol/week: 0.0 standard drinks    Comment: occ  . Drug use: No    Home Medications Prior to Admission medications   Medication Sig Start Date End Date Taking? Authorizing Provider  acetaminophen (TYLENOL) 500 MG tablet Take 2 tablets (1,000 mg total) by mouth 2 (two) times daily as needed. 04/30/18   Laurey Morale, MD  ALPRAZolam Duanne Moron) 0.25 MG tablet TAKE 1 TABLET BY MOUTH EVERY DAY AT BEDTIME AS NEEDED 10/31/20   Laurey Morale, MD  atorvastatin (LIPITOR) 20 MG tablet TAKE 1 TABLET BY MOUTH  DAILY 06/16/20   Josue Hector, MD  Calcium Carbonate-Vitamin D (CALTRATE 600+D) 600-400 MG-UNIT per tablet Take 1 tablet by mouth daily.      [provider]  carvedilol (COREG) 12.5 MG tablet Take 1 tablet (12.5 mg total) by mouth 2 (two) times daily with a meal. 09/15/20   Josue Hector, MD  cetirizine (ZYRTEC) 10 MG tablet Take 1 tablet (10 mg total) by mouth daily. 10/30/18    Hoyt Koch, MD  CLIMARA 0.1 MG/24HR patch Use as directed 03/23/19   [provider]  furosemide (LASIX) 40 MG tablet Take 1 tablet (40 mg total) by mouth daily. 04/11/20   Josue Hector, MD  ketorolac (ACULAR) 0.5 % ophthalmic solution PLACE 1 DROP INTO THE RIGHT EYE TWICE A DAY FOR 30 DAYS 07/18/20   Rankin, Clent Demark, MD  meclizine (ANTIVERT) 25 MG tablet Take 1 tablet (25 mg total) by mouth every 4 (four) hours as needed for dizziness. 12/08/19   Sarajane Jews,  Ishmael Holter, MD  Multiple Vitamins-Minerals (CENTRUM SILVER PO) Take 1 tablet by mouth daily.      [provider]  nitroGLYCERIN (NITROSTAT) 0.4 MG SL tablet PLACE 1 TABLET UNDER THE TONGUE EVERY 5 MINUTES AS NEEDED FOR CHEST PAIN 06/17/20   Laurey Morale, MD  nystatin-triamcinolone ointment (MYCOLOG) APPLY TO AFFECTED AREA TWICE A DAY AS NEEDED 11/25/19   [provider]  ondansetron (ZOFRAN) 8 MG tablet TAKE 1 TABLET (8 MG TOTAL) BY MOUTH EVERY 6 (SIX) HOURS AS NEEDED FOR NAUSEA OR VOMITING. 10/31/20   Laurey Morale, MD  phenazopyridine (PYRIDIUM) 200 MG tablet Take 1 tablet (200 mg total) by mouth 3 (three) times daily as needed (urinary burning). 09/03/19   Laurey Morale, MD  potassium chloride SA (KLOR-CON) 20 MEQ tablet Take 1 tablet (20 mEq total) by mouth every other day. 04/11/20   Josue Hector, MD  quinapril (ACCUPRIL) 10 MG tablet Take 1 tablet (10 mg total) by mouth daily. 09/15/20   Josue Hector, MD  spironolactone (ALDACTONE) 25 MG tablet TAKE 1 TABLET BY MOUTH  DAILY 06/16/20   Josue Hector, MD  SYNTHROID 100 MCG tablet TAKE 1 TABLET BY MOUTH  DAILY BEFORE BREAKFAST 06/06/20   Laurey Morale, MD  vitamin B-12 (CYANOCOBALAMIN) 1000 MCG tablet Take 1,000 mcg by mouth daily.      [provider]  warfarin (COUMADIN) 5 MG tablet TAKE AS DIRECTED BY  [facility] 10/26/20   Josue Hector, MD    Allergies    Codeine  Review of Systems   Review of Systems  Respiratory: Positive for  shortness of breath.   Cardiovascular: Positive for chest pain.  All other systems reviewed and are negative.   Physical Exam Updated Vital Signs BP 97/71   Pulse 99   Temp 98.5 F (36.9 C) (Oral)   Resp (!) 24   SpO2 90%   Physical Exam Vitals and nursing note reviewed.  Constitutional:      Comments: Uncomfortable   HENT:     Head: Normocephalic.  Eyes:     Extraocular Movements: Extraocular movements intact.     Pupils: Pupils are equal, round, and reactive to light.  Cardiovascular:     Rate and Rhythm: Normal rate and regular rhythm.     Heart sounds: Murmur heard.  Systolic murmur is present with a grade of 2/6.  Crescendo diastolic murmur is present.      Comments: 2/6 systolic murmur loudest at the LUSB  Pulmonary:     Comments: Tachypneic and crackles bilateral bases Abdominal:     General: Bowel sounds are normal.     Palpations: Abdomen is soft.  Musculoskeletal:        General: Normal range of motion.     Cervical back: Normal range of motion and neck supple.     Right lower leg: Edema present.     Left lower leg: Edema present.     Comments: Trace edema bilaterally   Skin:    General: Skin is warm.     Capillary Refill: Capillary refill takes less than 2 seconds.  Neurological:     General: No focal deficit present.     Mental Status: She is oriented to person, place, and time.  Psychiatric:        Mood and Affect: Mood normal.        Behavior: Behavior normal.     ED Results / Procedures / Treatments   Labs (  all labs ordered are listed, but only abnormal results are displayed) Labs Reviewed  BASIC METABOLIC PANEL - Abnormal; Notable for the following components:      Result Value   Sodium 125 (*)    Potassium 5.7 (*)    Chloride 95 (*)    CO2 20 (*)    Glucose, Bld 128 (*)    BUN 34 (*)    Creatinine, Ser 1.04 (*)    GFR, Estimated 51 (*)    All other components within normal limits  CBC - Abnormal; Notable for the following  components:   WBC 12.6 (*)    RBC 3.72 (*)    Platelets 135 (*)    All other components within normal limits  BRAIN NATRIURETIC PEPTIDE - Abnormal; Notable for the following components:   B Natriuretic Peptide 672.0 (*)    All other components within normal limits  PROTIME-INR - Abnormal; Notable for the following components:   Prothrombin Time 28.8 (*)    INR 2.8 (*)    All other components within normal limits  HEPATIC FUNCTION PANEL - Abnormal; Notable for the following components:   Total Bilirubin 3.2 (*)    Bilirubin, Direct 1.2 (*)    Indirect Bilirubin 2.0 (*)    All other components within normal limits  I-STAT CHEM 8, ED - Abnormal; Notable for the following components:   Sodium 126 (*)    Potassium 5.5 (*)    Chloride 95 (*)    BUN 33 (*)    Glucose, Bld 136 (*)    All other components within normal limits  URINE CULTURE  RESP PANEL BY RT-PCR (FLU A&B, COVID) ARPGX2  LIPASE, BLOOD  URINALYSIS, ROUTINE W REFLEX MICROSCOPIC  TROPONIN I (HIGH SENSITIVITY)  TROPONIN I (HIGH SENSITIVITY)    EKG EKG Interpretation  Date/Time:  Tuesday November 01 2020 16:43:10 EST Ventricular Rate:  89 PR Interval:    QRS Duration: 82 QT Interval:  356 QTC Calculation: 433 R Axis:   116 Text Interpretation: Atrial fibrillation Right axis deviation Right ventricular hypertrophy Abnormal ECG improved baseline compared to previous Confirmed by Wandra Arthurs [id number]) on 11/01/2020 7:00:17 PM   Radiology DG Chest 2 View  Result Date: 11/01/2020 CLINICAL DATA:  Chest pain EXAM: CHEST - 2 VIEW COMPARISON:  12/07/2019 and prior. FINDINGS: Left predominant patchy bibasilar opacities. No pneumothorax or pleural effusion. Cardiomegaly and central pulmonary vascular congestion, unchanged. Multilevel spondylosis. Osteopenia. IMPRESSION: Cardiomegaly and central pulmonary vascular congestion. Patchy bibasilar opacities. Differential includes atelectasis, infection or edema. Electronically Signed    By: Primitivo Gauze M.D.   On: 11/01/2020 17:37   CT Angio Chest/Abd/Pel for Dissection W and/or Wo Contrast  Result Date: 11/01/2020 CLINICAL DATA:  Abdominal pain.  Chest pain.  Shortness of breath. EXAM: CT ANGIOGRAPHY CHEST, ABDOMEN AND PELVIS TECHNIQUE: Non-contrast CT of the chest was initially obtained. Multidetector CT imaging through the chest, abdomen and pelvis was performed using the standard protocol during bolus administration of intravenous contrast. Multiplanar reconstructed images and MIPs were obtained and reviewed to evaluate the vascular anatomy. CONTRAST:  161mL OMNIPAQUE IOHEXOL 350 MG/ML SOLN COMPARISON:  February 17, 2016 FINDINGS: CTA CHEST FINDINGS Cardiovascular: There is significant cardiomegaly with severe biatrial enlargement. There are atherosclerotic changes of the thoracic aorta without evidence for an aneurysm or dissection. There are coronary artery calcifications. There is a moderate-sized pericardial effusion. There is no evidence for a large centrally located pulmonary embolism. There is reflux of contrast into the IVC. Mediastinum/Nodes: --  No mediastinal lymphadenopathy. -- No hilar lymphadenopathy. -- No axillary lymphadenopathy. -- No supraclavicular lymphadenopathy. -- Normal thyroid gland where visualized. -  Unremarkable esophagus. Lungs/Pleura: There are trace to small bilateral pleural effusions with adjacent atelectasis, left worse than right. There is a mosaic appearance of the lung parenchyma bilaterally. There is a ground-glass airspace opacity in the left upper lobe measuring approximately 6 mm (axial series 11, image 35). There is bilateral interlobular septal thickening the trachea is unremarkable. Musculoskeletal: No chest wall abnormality. No bony spinal canal stenosis. Review of the MIP images confirms the above findings. CTA ABDOMEN AND PELVIS FINDINGS VASCULAR Aorta: There are atherosclerotic changes of the abdominal aorta without evidence for an  aneurysm or dissection. Celiac: There is moderate narrowing at the origin of the celiac axis. SMA: Patent without evidence of aneurysm, dissection, vasculitis or significant stenosis. Renals: Both renal arteries are patent without evidence of aneurysm, dissection, vasculitis, fibromuscular dysplasia or significant stenosis. There are 2 right renal arteries. IMA: Patent without evidence of aneurysm, dissection, vasculitis or significant stenosis. Inflow: Patent without evidence of aneurysm, dissection, vasculitis or significant stenosis. Veins: No obvious venous abnormality within the limitations of this arterial phase study. Review of the MIP images confirms the above findings. NON-VASCULAR Hepatobiliary: The liver is enlarged and somewhat nodular in appearance. There is suggestion of hepatic venous congestion which may be secondary to right-sided heart failure. Normal gallbladder.There is no biliary ductal dilation. Pancreas: Normal contours without ductal dilatation. No peripancreatic fluid collection. Spleen: The spleen is enlarged measuring 13 cm craniocaudad. Adrenals/Urinary Tract: --Adrenal glands: Unremarkable. --Right kidney/ureter: No hydronephrosis or radiopaque kidney stones. --Left kidney/ureter: No hydronephrosis or radiopaque kidney stones. --Urinary bladder: Unremarkable. Stomach/Bowel: --Stomach/Duodenum: No hiatal hernia or other gastric abnormality. Normal duodenal course and caliber. --Small bowel: Unremarkable. --Colon: Rectosigmoid diverticulosis without acute inflammation. The patient appears to be status post prior right hemicolectomy. --Appendix: Surgically absent. Lymphatic: --No retroperitoneal lymphadenopathy. --No mesenteric lymphadenopathy. --No pelvic or inguinal lymphadenopathy. Reproductive: Status post hysterectomy. No adnexal mass. Other: There is a small amount of free fluid in the abdomen and pelvis. The abdominal wall is normal. Musculoskeletal. No acute displaced fractures.  Review of the MIP images confirms the above findings. IMPRESSION: 1. No evidence for aortic dissection or aneurysm. 2. Cardiomegaly with severe biatrial enlargement. There is a moderate-sized pericardial effusion. 3. Findings of congestive heart failure with developing pulmonary edema. 4. Ground-glass airspace opacity in the left upper lobe measuring approximately 6 mm. This may be secondary to underlying pulmonary edema. Follow-up recommendations are as follows. Initial follow-up with CT at 6-12 months is recommended to confirm persistence. If persistent, repeat CT is recommended every 2 years until 5 years of stability has been established. This recommendation follows the consensus statement: Guidelines for Management of Incidental Pulmonary Nodules Detected on CT Images: From the Fleischner Society 2017; Radiology 2017; 284:228-243. Aortic Atherosclerosis (ICD10-I70.0). Electronically Signed   By: Constance Holster M.D.   On: 11/01/2020 19:52    Procedures Procedures (including critical care time)  CRITICAL CARE Performed by: Wandra Arthurs   Total critical care time: 30 minutes  Critical care time was exclusive of separately billable procedures and treating other patients.  Critical care was necessary to treat or prevent imminent or life-threatening deterioration.  Critical care was time spent personally by me on the following activities: development of treatment plan with patient and/or surrogate as well as nursing, discussions with consultants, evaluation of patient's response to treatment, examination of patient, obtaining history from patient or  surrogate, ordering and performing treatments and interventions, ordering and review of laboratory studies, ordering and review of radiographic studies, pulse oximetry and re-evaluation of patient's condition.    EMERGENCY DEPARTMENT [place] CARDIAC EXAM "Study: Limited Ultrasound of the Heart and Pericardium"  INDICATIONS:Abnormal vital signs and  Dyspnea Multiple views of the heart and pericardium were obtained in real-time with a multi-frequency probe.  PERFORMED PP:IRJJOA IMAGES ARCHIVED?: Yes LIMITATIONS:  Body habitus VIEWS USED: Subcostal 4 chamber, Parasternal long axis, Parasternal short axis, Apical 4 chamber  and Inferior Vena Cava INTERPRETATION: dec EF, dilated RV, dilated IVC     Medications Ordered in ED Medications  furosemide (LASIX) injection 20 mg (has no administration in time range)  fentaNYL (SUBLIMAZE) injection 50 mcg (has no administration in time range)  ondansetron (ZOFRAN) injection 4 mg (4 mg Intravenous Given 11/01/20 1853)  fentaNYL (SUBLIMAZE) injection 50 mcg (50 mcg Intravenous Given 11/01/20 1855)  iohexol (OMNIPAQUE) 350 MG/ML injection 100 mL (100 mLs Intravenous Contrast Given 11/01/20 1915)    ED Course  I have reviewed the triage vital signs and the nursing notes.  Pertinent labs & imaging results that were available during my care of the patient were reviewed by me and considered in my medical decision making (see chart for details).    MDM Rules/Calculators/A&P                          CHARLEN BAKULA is a 86 y.o. female here with chest pain.  Patient has history of aortic stenosis and has a loud systolic murmur.  Patient appears to be in volume overload as well.  Patient appears to be writhing around in pain as well.  Consider ACS versus heart failure versus dissection.  Plan to get CT dissection study, CBC, CMP, troponin x2.  Patient will likely need admission.   8:35 PM BNP is elevated at 600.  INR is 2.8.  Also potassium is 5.5 and sodium is 125.  CT showed moderate pericardial effusion.  Patient is hypotensive perform bedside echo which showed decreased EF and there is no obvious right heart strain but she has known tricuspid regurg.  Patient does have a dilated IVC as well.  I discussed case with cardiology who will see patient as consult.  Hospitalist to admit to stepdown. She is  hypoxic from CHF and pericardial effusion. Given lasix in the ED.   Final Clinical Impression(s) / ED Diagnoses Final diagnoses:  None    Rx / DC Orders ED Discharge Orders    None       Drenda Freeze, MD 11/01/20 2038

## 2020-11-01 NOTE — ED Notes (Addendum)
[name] [phone] (niece) would like to be contacted for updates.

## 2020-11-01 NOTE — ED Triage Notes (Signed)
IV 20g left forearm,  Given 4mg  of Zofran- 1625pm Nitro .04

## 2020-11-01 NOTE — ED Notes (Addendum)
Pt reports severe constant chest pain radiating to right shoulder and posterior head, pt having intermittent nausea. SPO2 89-90% RA, pt placed on 3L O2 via [place].

## 2020-11-01 NOTE — ED Triage Notes (Signed)
EMS stated, pt has chest pain with SOB with nausea. Started yesterday. Has some A- Fib.

## 2020-11-01 NOTE — H&P (Signed)
History and Physical    [name] QVZ:[id number] DOB: 08/26/1929 DOA: 11/01/2020  PCP: Laurey Morale, MD  Patient coming from: Home.  Chief Complaint: Chest pain.  HPI: [name] is a 86 y.o. female with history of A. fib, mitral stenosis, chronic kidney disease stage III, chronic hyponatremia, history of colon cancer, hypothyroidism, anemia presents to the ER because of chest pain.  Patient has been having chest pain for the last 1 week mostly in the center of the chest radiating to the left arm.  Patient did have some dysuria over the last few days and had gone to the urgent care center on Saturday about 4 days ago and was given antibiotics which patient has been taking for last 3 days.  Her dysuria has improved.  But due to worsening chest pain which is pleuritic in nature radiating to her left shoulder patient presents to the ER.  Denies any nausea vomiting diarrhea has some shortness of breath.  Felt weak.  Patient received her COVID-19 booster shot vaccination a month ago exactly.  ED Course: In the ER patient was afebrile and not hypoxic but was complaining of persistent chest pain underwent CT dissection study which showed features concerning for moderate pericardial effusion with CHF/pulmonary edema findings.  EKG shows A. fib rate controlled at 89 bpm.  Cardiology on-call was consulted.  At this time cardiology feels that patient does not clinically look like having cardiac tamponade physiology.  However requested 2D echo and if features are concerning for pericarditis and start medications after that.  To trend cardiac markers.  Covid test was negative.  UA is pending.  Patient was running blood pressures in the low 90s.  Did receive 1 dose of Lasix 20 mg IV.  Labs are also significant for new thrombocytopenia hypokalemia of 5.5 sodium of 126 BNP of 672.  High sensitive troponins were negative.  Review of Systems: As per HPI, rest all negative.   Past Medical History:   Diagnosis Date  . Anxiety   . Atrial fibrillation ([place])   . Cancer ([place])   . Cataract    REMOVED BILATERAL  . Chest pain, unspecified   . Chronic low back pain   . Diverticulosis of colon   . History of colon cancer   . History of colonic polyps   . History of colonoscopy   . History of uterine cancer   . Hyperlipidemia   . Hypertension   . Hypothyroid   . Impaired glucose tolerance   . Mitral valve disorder   . Osteoarthritis   . Osteoporosis   . Patent foramen ovale   . Peripheral neuropathy   . Peripheral vascular disease ([place])   . Splenic infarction   . UTI (lower urinary tract infection)   . Venous insufficiency   . Vitamin B12 deficiency     Past Surgical History:  Procedure Laterality Date  . ANTERIOR AND POSTERIOR VAGINAL REPAIR  03/2006   Dr.Neal  . basal cell skin cancer Moh's surgery    . CATARACT EXTRACTION, BILATERAL    . COLONOSCOPY  05-25-10   per Dr. Henrene Pastor, benign polyps, repeat in 5 yrs   . EYE SURGERY    . hysterectomy for uterine cancer    . left carpal tunnel surgery  02/2007   Dr. Daylene Katayama  . LEFT HEART CATH AND CORONARY ANGIOGRAPHY N/A 02/12/2019   Procedure: LEFT HEART CATH AND CORONARY ANGIOGRAPHY;  Surgeon: Lorretta Harp, MD;  Location: [facility];  Service: Cardiovascular;  Laterality: N/A;  . lumbar laminectomy for spinal stenosis  2006   L3-4  . RETINAL DETACHMENT REPAIR W/ SCLERAL BUCKLE LE Right 03-01-14   per Dr. Zadie Rhine   . right hemicolectomy for colon cancer  1994     reports that she has never smoked. She has never used smokeless tobacco. She reports current alcohol use. She reports that she does not use drugs.  Allergies  Allergen Reactions  . Codeine Nausea Only    REACTION: nausea    Family History  Problem Relation Age of Onset  . Cancer Mother        deceased age 34  . Heart failure Father        deceased age 63  . Cancer Brother        deceased age 43  . Stroke Sister        and heart problems; deceased  age 73  . Heart disease Sister     Prior to Admission medications   Medication Sig Start Date End Date Taking? Authorizing Provider  acetaminophen (TYLENOL) 500 MG tablet Take 2 tablets (1,000 mg total) by mouth 2 (two) times daily as needed. 04/30/18   Laurey Morale, MD  ALPRAZolam Duanne Moron) 0.25 MG tablet TAKE 1 TABLET BY MOUTH EVERY DAY AT BEDTIME AS NEEDED 10/31/20   Laurey Morale, MD  atorvastatin (LIPITOR) 20 MG tablet TAKE 1 TABLET BY MOUTH  DAILY 06/16/20   Josue Hector, MD  Calcium Carbonate-Vitamin D (CALTRATE 600+D) 600-400 MG-UNIT per tablet Take 1 tablet by mouth daily.      [provider]  carvedilol (COREG) 12.5 MG tablet Take 1 tablet (12.5 mg total) by mouth 2 (two) times daily with a meal. 09/15/20   Josue Hector, MD  cetirizine (ZYRTEC) 10 MG tablet Take 1 tablet (10 mg total) by mouth daily. 10/30/18   Hoyt Koch, MD  CLIMARA 0.1 MG/24HR patch Use as directed 03/23/19   [provider]  furosemide (LASIX) 40 MG tablet Take 1 tablet (40 mg total) by mouth daily. 04/11/20   Josue Hector, MD  ketorolac (ACULAR) 0.5 % ophthalmic solution PLACE 1 DROP INTO THE RIGHT EYE TWICE A DAY FOR 30 DAYS 07/18/20   Rankin, Clent Demark, MD  meclizine (ANTIVERT) 25 MG tablet Take 1 tablet (25 mg total) by mouth every 4 (four) hours as needed for dizziness. 12/08/19   Laurey Morale, MD  Multiple Vitamins-Minerals (CENTRUM SILVER PO) Take 1 tablet by mouth daily.      [provider]  nitroGLYCERIN (NITROSTAT) 0.4 MG SL tablet PLACE 1 TABLET UNDER THE TONGUE EVERY 5 MINUTES AS NEEDED FOR CHEST PAIN 06/17/20   Laurey Morale, MD  nystatin-triamcinolone ointment (MYCOLOG) APPLY TO AFFECTED AREA TWICE A DAY AS NEEDED 11/25/19   [provider]  ondansetron (ZOFRAN) 8 MG tablet TAKE 1 TABLET (8 MG TOTAL) BY MOUTH EVERY 6 (SIX) HOURS AS NEEDED FOR NAUSEA OR VOMITING. 10/31/20   Laurey Morale, MD  phenazopyridine (PYRIDIUM) 200 MG tablet Take 1 tablet (200 mg  total) by mouth 3 (three) times daily as needed (urinary burning). 09/03/19   Laurey Morale, MD  potassium chloride SA (KLOR-CON) 20 MEQ tablet Take 1 tablet (20 mEq total) by mouth every other day. 04/11/20   Josue Hector, MD  quinapril (ACCUPRIL) 10 MG tablet Take 1 tablet (10 mg total) by mouth daily. 09/15/20   Josue Hector, MD  spironolactone (ALDACTONE) 25  MG tablet TAKE 1 TABLET BY MOUTH  DAILY 06/16/20   Josue Hector, MD  SYNTHROID 100 MCG tablet TAKE 1 TABLET BY MOUTH  DAILY BEFORE BREAKFAST 06/06/20   Laurey Morale, MD  vitamin B-12 (CYANOCOBALAMIN) 1000 MCG tablet Take 1,000 mcg by mouth daily.      [provider]  warfarin (COUMADIN) 5 MG tablet TAKE AS DIRECTED BY  [facility] 10/26/20   Josue Hector, MD    Physical Exam: Constitutional: Moderately built and nourished. Vitals:   11/01/20 1955 11/01/20 2000 11/01/20 2100 11/01/20 2215  BP: 97/71 105/77 101/70 104/76  Pulse: 99 (!) 106 (!) 107 (!) 114  Resp: (!) 24 20 (!) 30 (!) 25  Temp:      TempSrc:      SpO2: 90% 90% 95% 96%   Eyes: Anicteric no pallor. ENMT: No discharge from the ears eyes nose or mouth. Neck: No mass felt.  No neck rigidity.  No JVD appreciated. Respiratory: No rhonchi or crepitations. Cardiovascular: S1-S2 heard. Abdomen: Soft nontender bowel sounds present. Musculoskeletal: No edema. Skin: No rash. Neurologic: Alert awake oriented to time place and person.  Moves all extremities. Psychiatric: Appears normal.  Normal affect.   Labs on Admission: I have personally reviewed following labs and imaging studies  CBC: Recent Labs  Lab 11/01/20 1649 11/01/20 2018  WBC 12.6*  --   HGB 12.0 12.6  HCT 37.2 37.0  MCV 100.0  --   PLT 135*  --    Basic Metabolic Panel: Recent Labs  Lab 11/01/20 1649 11/01/20 2018  NA 125* 126*  K 5.7* 5.5*  CL 95* 95*  CO2 20*  --   GLUCOSE 128* 136*  BUN 34* 33*  CREATININE 1.04* 1.00  CALCIUM 9.2  --    GFR: CrCl cannot be  calculated (Unknown ideal weight.). Liver Function Tests: Recent Labs  Lab 11/01/20 1849  AST 22  ALT 16  ALKPHOS 94  BILITOT 3.2*  PROT 6.9  ALBUMIN 3.6   Recent Labs  Lab 11/01/20 1849  LIPASE 25   No results for input(s): AMMONIA in the last 168 hours. Coagulation Profile: Recent Labs  Lab 11/01/20 1827  INR 2.8*   Cardiac Enzymes: No results for input(s): CKTOTAL, CKMB, CKMBINDEX, TROPONINI in the last 168 hours. BNP (last 3 results) No results for input(s): PROBNP in the last 8760 hours. HbA1C: No results for input(s): HGBA1C in the last 72 hours. CBG: No results for input(s): GLUCAP in the last 168 hours. Lipid Profile: No results for input(s): CHOL, HDL, LDLCALC, TRIG, CHOLHDL, LDLDIRECT in the last 72 hours. Thyroid Function Tests: No results for input(s): TSH, T4TOTAL, FREET4, T3FREE, THYROIDAB in the last 72 hours. Anemia Panel: No results for input(s): VITAMINB12, FOLATE, FERRITIN, TIBC, IRON, RETICCTPCT in the last 72 hours. Urine analysis:    Component Value Date/Time   COLORURINE YELLOW 12/07/2019 1056   APPEARANCEUR CLEAR 12/07/2019 1056   LABSPEC 1.010 12/07/2019 1056   PHURINE 6.0 12/07/2019 1056   GLUCOSEU NEGATIVE 12/07/2019 1056   GLUCOSEU NEGATIVE 07/15/2008 1258   HGBUR SMALL (A) 12/07/2019 1056   BILIRUBINUR negative 05/25/2020 1430   KETONESUR NEGATIVE 12/07/2019 1056   PROTEINUR Negative 05/25/2020 1430   PROTEINUR NEGATIVE 12/07/2019 1056   UROBILINOGEN 0.2 05/25/2020 1430   UROBILINOGEN 0.2 09/12/2014 1810   NITRITE negative 05/25/2020 1430   NITRITE NEGATIVE 12/07/2019 1056   LEUKOCYTESUR Negative 05/25/2020 1430   LEUKOCYTESUR NEGATIVE 12/07/2019 1056   Sepsis Labs: @LABRCNTIP (procalcitonin:4,lacticidven:4) )  Recent Results (from the past 240 hour(s))  Resp Panel by RT-PCR (Flu A&B, Covid) Nasopharyngeal Swab     Status: None   Collection Time: 11/01/20  7:58 PM   Specimen: Nasopharyngeal Swab; Nasopharyngeal(NP) swabs in  vial transport medium  Result Value Ref Range Status   SARS Coronavirus 2 by RT PCR NEGATIVE NEGATIVE Final    Comment: (NOTE) SARS-CoV-2 target nucleic acids are NOT DETECTED.  The SARS-CoV-2 RNA is generally detectable in upper respiratory specimens during the acute phase of infection. The lowest concentration of SARS-CoV-2 viral copies this assay can detect is 138 copies/mL. A negative result does not preclude SARS-Cov-2 infection and should not be used as the sole basis for treatment or other patient management decisions. A negative result may occur with  improper specimen collection/handling, submission of specimen other than nasopharyngeal swab, presence of viral mutation(s) within the areas targeted by this assay, and inadequate number of viral copies(<138 copies/mL). A negative result must be combined with clinical observations, patient history, and epidemiological information. The expected result is Negative.  Fact Sheet for Patients:  EntrepreneurPulse.com.au  Fact Sheet for Healthcare Providers:  IncredibleEmployment.be  This test is no t yet approved or cleared by the [place] FDA and  has been authorized for detection and/or diagnosis of SARS-CoV-2 by FDA under an Emergency Use Authorization (EUA). This EUA will remain  in effect (meaning this test can be used) for the duration of the COVID-19 declaration under Section 564(b)(1) of the Act, 21 U.S.C.section 360bbb-3(b)(1), unless the authorization is terminated  or revoked sooner.       Influenza A by PCR NEGATIVE NEGATIVE Final   Influenza B by PCR NEGATIVE NEGATIVE Final    Comment: (NOTE) The Xpert Xpress SARS-CoV-2/FLU/RSV plus assay is intended as an aid in the diagnosis of influenza from Nasopharyngeal swab specimens and should not be used as a sole basis for treatment. Nasal washings and aspirates are unacceptable for Xpert Xpress SARS-CoV-2/FLU/RSV testing.  Fact  Sheet for Patients: EntrepreneurPulse.com.au  Fact Sheet for Healthcare Providers: IncredibleEmployment.be  This test is not yet approved or cleared by the [place] FDA and has been authorized for detection and/or diagnosis of SARS-CoV-2 by FDA under an Emergency Use Authorization (EUA). This EUA will remain in effect (meaning this test can be used) for the duration of the COVID-19 declaration under Section 564(b)(1) of the Act, 21 U.S.C. section 360bbb-3(b)(1), unless the authorization is terminated or revoked.  Performed at [facility], [place] [address] [place], [place] [address]      Radiological Exams on Admission: DG Chest 2 View  Result Date: 11/01/2020 CLINICAL DATA:  Chest pain EXAM: CHEST - 2 VIEW COMPARISON:  12/07/2019 and prior. FINDINGS: Left predominant patchy bibasilar opacities. No pneumothorax or pleural effusion. Cardiomegaly and central pulmonary vascular congestion, unchanged. Multilevel spondylosis. Osteopenia. IMPRESSION: Cardiomegaly and central pulmonary vascular congestion. Patchy bibasilar opacities. Differential includes atelectasis, infection or edema. Electronically Signed   By: Primitivo Gauze M.D.   On: 11/01/2020 17:37   CT Angio Chest/Abd/Pel for Dissection W and/or Wo Contrast  Result Date: 11/01/2020 CLINICAL DATA:  Abdominal pain.  Chest pain.  Shortness of breath. EXAM: CT ANGIOGRAPHY CHEST, ABDOMEN AND PELVIS TECHNIQUE: Non-contrast CT of the chest was initially obtained. Multidetector CT imaging through the chest, abdomen and pelvis was performed using the standard protocol during bolus administration of intravenous contrast. Multiplanar reconstructed images and MIPs were obtained and reviewed to evaluate the vascular anatomy. CONTRAST:  144mL OMNIPAQUE IOHEXOL 350  MG/ML SOLN COMPARISON:  February 17, 2016 FINDINGS: CTA CHEST FINDINGS Cardiovascular: There is significant cardiomegaly with severe biatrial  enlargement. There are atherosclerotic changes of the thoracic aorta without evidence for an aneurysm or dissection. There are coronary artery calcifications. There is a moderate-sized pericardial effusion. There is no evidence for a large centrally located pulmonary embolism. There is reflux of contrast into the IVC. Mediastinum/Nodes: -- No mediastinal lymphadenopathy. -- No hilar lymphadenopathy. -- No axillary lymphadenopathy. -- No supraclavicular lymphadenopathy. -- Normal thyroid gland where visualized. -  Unremarkable esophagus. Lungs/Pleura: There are trace to small bilateral pleural effusions with adjacent atelectasis, left worse than right. There is a mosaic appearance of the lung parenchyma bilaterally. There is a ground-glass airspace opacity in the left upper lobe measuring approximately 6 mm (axial series 11, image 35). There is bilateral interlobular septal thickening the trachea is unremarkable. Musculoskeletal: No chest wall abnormality. No bony spinal canal stenosis. Review of the MIP images confirms the above findings. CTA ABDOMEN AND PELVIS FINDINGS VASCULAR Aorta: There are atherosclerotic changes of the abdominal aorta without evidence for an aneurysm or dissection. Celiac: There is moderate narrowing at the origin of the celiac axis. SMA: Patent without evidence of aneurysm, dissection, vasculitis or significant stenosis. Renals: Both renal arteries are patent without evidence of aneurysm, dissection, vasculitis, fibromuscular dysplasia or significant stenosis. There are 2 right renal arteries. IMA: Patent without evidence of aneurysm, dissection, vasculitis or significant stenosis. Inflow: Patent without evidence of aneurysm, dissection, vasculitis or significant stenosis. Veins: No obvious venous abnormality within the limitations of this arterial phase study. Review of the MIP images confirms the above findings. NON-VASCULAR Hepatobiliary: The liver is enlarged and somewhat nodular in  appearance. There is suggestion of hepatic venous congestion which may be secondary to right-sided heart failure. Normal gallbladder.There is no biliary ductal dilation. Pancreas: Normal contours without ductal dilatation. No peripancreatic fluid collection. Spleen: The spleen is enlarged measuring 13 cm craniocaudad. Adrenals/Urinary Tract: --Adrenal glands: Unremarkable. --Right kidney/ureter: No hydronephrosis or radiopaque kidney stones. --Left kidney/ureter: No hydronephrosis or radiopaque kidney stones. --Urinary bladder: Unremarkable. Stomach/Bowel: --Stomach/Duodenum: No hiatal hernia or other gastric abnormality. Normal duodenal course and caliber. --Small bowel: Unremarkable. --Colon: Rectosigmoid diverticulosis without acute inflammation. The patient appears to be status post prior right hemicolectomy. --Appendix: Surgically absent. Lymphatic: --No retroperitoneal lymphadenopathy. --No mesenteric lymphadenopathy. --No pelvic or inguinal lymphadenopathy. Reproductive: Status post hysterectomy. No adnexal mass. Other: There is a small amount of free fluid in the abdomen and pelvis. The abdominal wall is normal. Musculoskeletal. No acute displaced fractures. Review of the MIP images confirms the above findings. IMPRESSION: 1. No evidence for aortic dissection or aneurysm. 2. Cardiomegaly with severe biatrial enlargement. There is a moderate-sized pericardial effusion. 3. Findings of congestive heart failure with developing pulmonary edema. 4. Ground-glass airspace opacity in the left upper lobe measuring approximately 6 mm. This may be secondary to underlying pulmonary edema. Follow-up recommendations are as follows. Initial follow-up with CT at 6-12 months is recommended to confirm persistence. If persistent, repeat CT is recommended every 2 years until 5 years of stability has been established. This recommendation follows the consensus statement: Guidelines for Management of Incidental Pulmonary Nodules  Detected on CT Images: From the Fleischner Society 2017; Radiology 2017; [id number]. Aortic Atherosclerosis (ICD10-I70.0). Electronically Signed   By: Constance Holster M.D.   On: 11/01/2020 19:52    EKG: Independently reviewed.  A. fib rate controlled at 89 bpm.  Assessment/Plan Principal Problem:   CHEST PAIN UNSPECIFIED Active Problems:  B12 deficiency   IMPAIRED GLUCOSE TOLERANCE   Essential hypertension   ATRIAL FIBRILLATION   Acute CHF (congestive heart failure) (HCC)   Chest pain    1. Moderate pericardial effusion.  CT chest discussed with cardiologist Dr. Olena Heckle advised to get 2D echo.  At this time as per the cardiology no definite signs of any tamponade physiology.  Cardiology advised me to hold off the Coumadin for now until we get a 2D echo. 2. Chest pain likely from possible pericarditis.  Check inflammatory markers 2D echo.  May need symptomatic management of possible pericarditis if 2D echo is unremarkable.  Trend cardiac markers.  CT scan of unremarkable. 3. History of severe mitral stenosis with CAT scan showing CHF finding 1 dose of Lasix 20 mg IV was given.  His blood pressures in the low normal we will try to avoid further doses for now until we make sure there is no definite cardiac tamponade features on the 2D echo.  Closely follow respiratory status intake output. 4. Chronic kidney disease stage III with hyperkalemia did receive 1 dose of Lasix in the ER.  Follow metabolic panel closely for any further worsening of hyperkalemia. 5. Chronic hyponatremia follow metabolic panel. 6. Chronic anemia follow CBC. 7. A. fib rate controlled presently holding Coumadin until we make sure that no definite tamponade physiology in the 2D echo. 8. Hypertension holding all antihypertensives due to low normal blood pressure. 9. Recently treated for UTI UA still pending presently asymptomatic.  Since patient does have leukocytosis we will keep patient on ceftriaxone.  Was treated on  Keflex as outpatient. 10. Hypothyroidism on Synthroid. 11. Thrombocytopenia appears to be new.  Could be from infectious process.  Given that patient has persistent chest pain with low normal blood pressure and pericardial effusion will need close monitoring for any further worsening in inpatient status.   DVT prophylaxis: Presently holding anticoagulation until we get 2D echo results.  INR is therapeutic. Code Status: DNR. Family Communication: Patient's daughter. Disposition Plan: Home. Consults called: Cardiology. Admission status: Inpatient.   Rise Patience MD [facility] Pager [phone].  If 7PM-7AM, please contact night-coverage [email or web address] Password [facility]  11/01/2020, 10:35 PM

## 2020-11-01 NOTE — Consult Note (Signed)
[facility] Consult Note   Primary Physician: Laurey Morale, MD      Primary Cardiologist:  Jenkins Rouge, MD    Reason for Consultation: Chest pain  HPI:    [name] is an 86 year old female with a history of atrial fibrillation (on Coumadin), moderate AS, severe MS (with moderate MR), preserved LV function, normal coronaries (per cath in 2020), episodic chest pain, chronic hyponatremia, hypothyroidism, osteoarthritis, colon CA, hypertension and hyperlipidemia, who presents to the hospital with complaints of nausea, myalgias, shortness of breath and chest discomfort. She has been feeling ill for more than a week. 2-3 days ago she was evaluated in Urgent Care and a urinalysis confirmed a UTI. She has been on antibiotics. She stated that her chest pain worsened on deep inspiration and radiated to the left shoulder. She described the pain as excruciating. As it continued for most of the day she eventually decided to come to the hospital.  The vital signs in the ED were: BP 111/75 mmHg, HR 90 bpm. Multiple labs were abnormal: Na 125, K 5.7, Gluc 128, BUN 34, BNP 672, WBC 12.6 and Platelets 135.  A CT scan ruled out an aortic dissection.  The ECG that I reviewed personally showed atrial fibrillation, rate 89 bpm, right axis deviation, right ventricular hypertrophy. There were no ST elevations or depressions.    Home Medications Prior to Admission medications   Medication Sig Start Date End Date Taking? Authorizing Provider  acetaminophen (TYLENOL) 500 MG tablet Take 2 tablets (1,000 mg total) by mouth 2 (two) times daily as needed. 04/30/18   Laurey Morale, MD  ALPRAZolam Duanne Moron) 0.25 MG tablet TAKE 1 TABLET BY MOUTH EVERY DAY AT BEDTIME AS NEEDED 10/31/20   Laurey Morale, MD  atorvastatin (LIPITOR) 20 MG tablet TAKE 1 TABLET BY MOUTH  DAILY 06/16/20   Josue Hector, MD  Calcium Carbonate-Vitamin D (CALTRATE 600+D) 600-400 MG-UNIT per tablet Take 1 tablet by mouth daily.       [provider]  carvedilol (COREG) 12.5 MG tablet Take 1 tablet (12.5 mg total) by mouth 2 (two) times daily with a meal. 09/15/20   Josue Hector, MD  cetirizine (ZYRTEC) 10 MG tablet Take 1 tablet (10 mg total) by mouth daily. 10/30/18   Hoyt Koch, MD  CLIMARA 0.1 MG/24HR patch Use as directed 03/23/19   [provider]  furosemide (LASIX) 40 MG tablet Take 1 tablet (40 mg total) by mouth daily. 04/11/20   Josue Hector, MD  ketorolac (ACULAR) 0.5 % ophthalmic solution PLACE 1 DROP INTO THE RIGHT EYE TWICE A DAY FOR 30 DAYS 07/18/20   Rankin, Clent Demark, MD  meclizine (ANTIVERT) 25 MG tablet Take 1 tablet (25 mg total) by mouth every 4 (four) hours as needed for dizziness. 12/08/19   Laurey Morale, MD  Multiple Vitamins-Minerals (CENTRUM SILVER PO) Take 1 tablet by mouth daily.      [provider]  nitroGLYCERIN (NITROSTAT) 0.4 MG SL tablet PLACE 1 TABLET UNDER THE TONGUE EVERY 5 MINUTES AS NEEDED FOR CHEST PAIN 06/17/20   Laurey Morale, MD  nystatin-triamcinolone ointment (MYCOLOG) APPLY TO AFFECTED AREA TWICE A DAY AS NEEDED 11/25/19   [provider]  ondansetron (ZOFRAN) 8 MG tablet TAKE 1 TABLET (8 MG TOTAL) BY MOUTH EVERY 6 (SIX) HOURS AS NEEDED FOR NAUSEA OR VOMITING. 10/31/20   Laurey Morale, MD  phenazopyridine (PYRIDIUM) 200 MG tablet Take 1 tablet (  200 mg total) by mouth 3 (three) times daily as needed (urinary burning). 09/03/19   Laurey Morale, MD  potassium chloride SA (KLOR-CON) 20 MEQ tablet Take 1 tablet (20 mEq total) by mouth every other day. 04/11/20   Josue Hector, MD  quinapril (ACCUPRIL) 10 MG tablet Take 1 tablet (10 mg total) by mouth daily. 09/15/20   Josue Hector, MD  spironolactone (ALDACTONE) 25 MG tablet TAKE 1 TABLET BY MOUTH  DAILY 06/16/20   Josue Hector, MD  SYNTHROID 100 MCG tablet TAKE 1 TABLET BY MOUTH  DAILY BEFORE BREAKFAST 06/06/20   Laurey Morale, MD  vitamin B-12 (CYANOCOBALAMIN) 1000 MCG tablet Take  1,000 mcg by mouth daily.      [provider]  warfarin (COUMADIN) 5 MG tablet TAKE AS DIRECTED BY  [facility] 10/26/20   Josue Hector, MD    Past Medical History: Past Medical History:  Diagnosis Date  . Anxiety   . Atrial fibrillation ([place])   . Cancer ([place])   . Cataract    REMOVED BILATERAL  . Chest pain, unspecified   . Chronic low back pain   . Diverticulosis of colon   . History of colon cancer   . History of colonic polyps   . History of colonoscopy   . History of uterine cancer   . Hyperlipidemia   . Hypertension   . Hypothyroid   . Impaired glucose tolerance   . Mitral valve disorder   . Osteoarthritis   . Osteoporosis   . Patent foramen ovale   . Peripheral neuropathy   . Peripheral vascular disease ([place])   . Splenic infarction   . UTI (lower urinary tract infection)   . Venous insufficiency   . Vitamin B12 deficiency     Past Surgical History: Past Surgical History:  Procedure Laterality Date  . ANTERIOR AND POSTERIOR VAGINAL REPAIR  03/2006   Dr.Neal  . basal cell skin cancer Moh's surgery    . CATARACT EXTRACTION, BILATERAL    . COLONOSCOPY  05-25-10   per Dr. Henrene Pastor, benign polyps, repeat in 5 yrs   . EYE SURGERY    . hysterectomy for uterine cancer    . left carpal tunnel surgery  02/2007   Dr. Daylene Katayama  . LEFT HEART CATH AND CORONARY ANGIOGRAPHY N/A 02/12/2019   Procedure: LEFT HEART CATH AND CORONARY ANGIOGRAPHY;  Surgeon: Lorretta Harp, MD;  Location: [place] [facility];  Service: Cardiovascular;  Laterality: N/A;  . lumbar laminectomy for spinal stenosis  2006   L3-4  . RETINAL DETACHMENT REPAIR W/ SCLERAL BUCKLE LE Right 03-01-14   per Dr. Zadie Rhine   . right hemicolectomy for colon cancer  1994    Family History: Family History  Problem Relation Age of Onset  . Cancer Mother        deceased age 81  . Heart failure Father        deceased age 28  . Cancer Brother        deceased age 46  . Stroke Sister        and heart  problems; deceased age 64  . Heart disease Sister     Social History: Social History   Socioeconomic History  . Marital status: Widowed    Spouse name: Not on file  . Number of children: 0  . Years of education: Not on file  . Highest education level: Not on file  Occupational History  . Occupation: retired  Comment: part time at [employer]  Tobacco Use  . Smoking status: Never Smoker  . Smokeless tobacco: Never Used  Substance and Sexual Activity  . Alcohol use: Yes    Alcohol/week: 0.0 standard drinks    Comment: occ  . Drug use: No  . Sexual activity: Never  Other Topics Concern  . Not on file  Social History Narrative  . Not on file   Social Determinants of Health   Financial Resource Strain:   . Difficulty of Paying Living Expenses: Not on file  Food Insecurity:   . Worried About Charity fundraiser in the Last Year: Not on file  . Ran Out of Food in the Last Year: Not on file  Transportation Needs:   . Lack of Transportation (Medical): Not on file  . Lack of Transportation (Non-Medical): Not on file  Physical Activity:   . Days of Exercise per Week: Not on file  . Minutes of Exercise per Session: Not on file  Stress:   . Feeling of Stress : Not on file  Social Connections:   . Frequency of Communication with Friends and Family: Not on file  . Frequency of Social Gatherings with Friends and Family: Not on file  . Attends Religious Services: Not on file  . Active Member of Clubs or Organizations: Not on file  . Attends Archivist Meetings: Not on file  . Marital Status: Not on file    Allergies:  Allergies  Allergen Reactions  . Codeine Nausea Only    REACTION: nausea     Review of Systems: [y] = yes, [ ]  = no   . General: Weight gain [ ] ; Weight loss [ ] ; Anorexia [ ] ; Fatigue [ ] ; Fever [ ] ; Chills [ ] ; Weakness [Y]  . Cardiac: Chest pain/pressure [Y]; Resting SOB [Y]; Exertional SOB [ ] ; Orthopnea [ ] ; Pedal Edema [ ] ; Palpitations [ ] ;  Syncope [ ] ; Presyncope [ ] ; Paroxysmal nocturnal dyspnea[ ]   . Pulmonary: Cough [ ] ; Wheezing[ ] ; Hemoptysis[ ] ; Sputum [ ] ; Snoring [ ]   . GI: Vomiting[ ] ; Dysphagia[ ] ; Melena[ ] ; Hematochezia [ ] ; Heartburn[ ] ; Abdominal pain [ ] ; Constipation [ ] ; Diarrhea [ ] ; BRBPR [ ]   . GU: Hematuria[ ] ; Dysuria [Y]; Nocturia[ ]   . Vascular: Pain in legs with walking [ ] ; Pain in feet with lying flat [ ] ; Non-healing sores [ ] ; Stroke [ ] ; TIA [ ] ; Slurred speech [ ] ;  . Neuro: Headaches[Y]; Vertigo[ ] ; Seizures[ ] ; Paresthesias[ ] ;Blurred vision [ ] ; Diplopia [ ] ; Vision changes [ ]   . Ortho/Skin: Arthritis [ ] ; Joint pain [ ] ; Muscle pain [ ] ; Joint swelling [ ] ; Back Pain [ ] ; Rash [ ]   . Psych: Depression[ ] ; Anxiety[ ]   . Heme: Bleeding problems [ ] ; Clotting disorders [ ] ; Anemia [ ]   . Endocrine: Diabetes [ ] ; Thyroid dysfunction[ ]      Objective:    Vital Signs:   Temp:  [98.3 F (36.8 C)-98.5 F (36.9 C)] 98.5 F (36.9 C) (12/07 1827) Pulse Rate:  [90-114] 114 (12/07 2215) Resp:  [18-30] 25 (12/07 2215) BP: (94-124)/(70-81) 104/76 (12/07 2215) SpO2:  [90 %-96 %] 96 % (12/07 2215)    Weight change: There were no vitals filed for this visit.  Intake/Output:  No intake or output data in the 24 hours ending 11/01/20 2229    Physical Exam    General:  Well appearing. No resp difficulty HEENT: normal Neck: supple.  No lymphadenopathy or thyromegaly appreciated. Cor: Irregularly irregular, 2/6 systolic murmur (mid peaking) at LUSB Lungs: clear to auscultation bilaterally Abdomen: soft, nontender, nondistended. No hepatosplenomegaly. No bruits or masses. Good bowel sounds. Extremities: no cyanosis, clubbing, trivial edema B/L LEs, chronic stasis dermatitis Neuro: alert & orientedx3, cranial nerves grossly intact. moves all 4 extremities w/o difficulty. Affect pleasant     Labs   Basic Metabolic Panel: Recent Labs  Lab 11/01/20 1649 11/01/20 2018  NA 125* 126*  K 5.7*  5.5*  CL 95* 95*  CO2 20*  --   GLUCOSE 128* 136*  BUN 34* 33*  CREATININE 1.04* 1.00  CALCIUM 9.2  --     Liver Function Tests: Recent Labs  Lab 11/01/20 1849  AST 22  ALT 16  ALKPHOS 94  BILITOT 3.2*  PROT 6.9  ALBUMIN 3.6   Recent Labs  Lab 11/01/20 1849  LIPASE 25   No results for input(s): AMMONIA in the last 168 hours.  CBC: Recent Labs  Lab 11/01/20 1649 11/01/20 2018  WBC 12.6*  --   HGB 12.0 12.6  HCT 37.2 37.0  MCV 100.0  --   PLT 135*  --     Cardiac Enzymes: No results for input(s): CKTOTAL, CKMB, CKMBINDEX, TROPONINI in the last 168 hours.  BNP: BNP (last 3 results) Recent Labs    11/01/20 1827  BNP 672.0*    ProBNP (last 3 results) No results for input(s): PROBNP in the last 8760 hours.   CBG: No results for input(s): GLUCAP in the last 168 hours.  Coagulation Studies: Recent Labs    11/01/20 1827  LABPROT 28.8*  INR 2.8*     Imaging   DG Chest 2 View  Result Date: 11/01/2020 CLINICAL DATA:  Chest pain EXAM: CHEST - 2 VIEW COMPARISON:  12/07/2019 and prior. FINDINGS: Left predominant patchy bibasilar opacities. No pneumothorax or pleural effusion. Cardiomegaly and central pulmonary vascular congestion, unchanged. Multilevel spondylosis. Osteopenia. IMPRESSION: Cardiomegaly and central pulmonary vascular congestion. Patchy bibasilar opacities. Differential includes atelectasis, infection or edema. Electronically Signed   By: Primitivo Gauze M.D.   On: 11/01/2020 17:37   CT Angio Chest/Abd/Pel for Dissection W and/or Wo Contrast  Result Date: 11/01/2020 CLINICAL DATA:  Abdominal pain.  Chest pain.  Shortness of breath. EXAM: CT ANGIOGRAPHY CHEST, ABDOMEN AND PELVIS TECHNIQUE: Non-contrast CT of the chest was initially obtained. Multidetector CT imaging through the chest, abdomen and pelvis was performed using the standard protocol during bolus administration of intravenous contrast. Multiplanar reconstructed images and MIPs  were obtained and reviewed to evaluate the vascular anatomy. CONTRAST:  187mL OMNIPAQUE IOHEXOL 350 MG/ML SOLN COMPARISON:  February 17, 2016 FINDINGS: CTA CHEST FINDINGS Cardiovascular: There is significant cardiomegaly with severe biatrial enlargement. There are atherosclerotic changes of the thoracic aorta without evidence for an aneurysm or dissection. There are coronary artery calcifications. There is a moderate-sized pericardial effusion. There is no evidence for a large centrally located pulmonary embolism. There is reflux of contrast into the IVC. Mediastinum/Nodes: -- No mediastinal lymphadenopathy. -- No hilar lymphadenopathy. -- No axillary lymphadenopathy. -- No supraclavicular lymphadenopathy. -- Normal thyroid gland where visualized. -  Unremarkable esophagus. Lungs/Pleura: There are trace to small bilateral pleural effusions with adjacent atelectasis, left worse than right. There is a mosaic appearance of the lung parenchyma bilaterally. There is a ground-glass airspace opacity in the left upper lobe measuring approximately 6 mm (axial series 11, image 35). There is bilateral interlobular septal thickening the trachea is unremarkable. Musculoskeletal: No  chest wall abnormality. No bony spinal canal stenosis. Review of the MIP images confirms the above findings. CTA ABDOMEN AND PELVIS FINDINGS VASCULAR Aorta: There are atherosclerotic changes of the abdominal aorta without evidence for an aneurysm or dissection. Celiac: There is moderate narrowing at the origin of the celiac axis. SMA: Patent without evidence of aneurysm, dissection, vasculitis or significant stenosis. Renals: Both renal arteries are patent without evidence of aneurysm, dissection, vasculitis, fibromuscular dysplasia or significant stenosis. There are 2 right renal arteries. IMA: Patent without evidence of aneurysm, dissection, vasculitis or significant stenosis. Inflow: Patent without evidence of aneurysm, dissection, vasculitis or  significant stenosis. Veins: No obvious venous abnormality within the limitations of this arterial phase study. Review of the MIP images confirms the above findings. NON-VASCULAR Hepatobiliary: The liver is enlarged and somewhat nodular in appearance. There is suggestion of hepatic venous congestion which may be secondary to right-sided heart failure. Normal gallbladder.There is no biliary ductal dilation. Pancreas: Normal contours without ductal dilatation. No peripancreatic fluid collection. Spleen: The spleen is enlarged measuring 13 cm craniocaudad. Adrenals/Urinary Tract: --Adrenal glands: Unremarkable. --Right kidney/ureter: No hydronephrosis or radiopaque kidney stones. --Left kidney/ureter: No hydronephrosis or radiopaque kidney stones. --Urinary bladder: Unremarkable. Stomach/Bowel: --Stomach/Duodenum: No hiatal hernia or other gastric abnormality. Normal duodenal course and caliber. --Small bowel: Unremarkable. --Colon: Rectosigmoid diverticulosis without acute inflammation. The patient appears to be status post prior right hemicolectomy. --Appendix: Surgically absent. Lymphatic: --No retroperitoneal lymphadenopathy. --No mesenteric lymphadenopathy. --No pelvic or inguinal lymphadenopathy. Reproductive: Status post hysterectomy. No adnexal mass. Other: There is a small amount of free fluid in the abdomen and pelvis. The abdominal wall is normal. Musculoskeletal. No acute displaced fractures. Review of the MIP images confirms the above findings. IMPRESSION: 1. No evidence for aortic dissection or aneurysm. 2. Cardiomegaly with severe biatrial enlargement. There is a moderate-sized pericardial effusion. 3. Findings of congestive heart failure with developing pulmonary edema. 4. Ground-glass airspace opacity in the left upper lobe measuring approximately 6 mm. This may be secondary to underlying pulmonary edema. Follow-up recommendations are as follows. Initial follow-up with CT at 6-12 months is recommended  to confirm persistence. If persistent, repeat CT is recommended every 2 years until 5 years of stability has been established. This recommendation follows the consensus statement: Guidelines for Management of Incidental Pulmonary Nodules Detected on CT Images: From the Fleischner Society 2017; Radiology 2017; 284:228-243. Aortic Atherosclerosis (ICD10-I70.0). Electronically Signed   By: Constance Holster M.D.   On: 11/01/2020 19:52      Medications:     Current Medications:    Infusions:      Assessment/Plan   1) Moderate pericardial effusion on CT The patient clinically does not exhibit tamponade physiology though she is slightly hypotensive in the setting of an acute UTI with an elevated white count. The INR is not supra-therapeutic (2.8) -Repeat echocardiogram in the morning -Consider admission to [facility] for close monitoring  2) Chest pain The patient had a cardiac cath on 02/12/2019. This revealed normal coronary arteries. The ECG on the current admission does not show either ST elevations or depressions. The high-sensitivity troponins are 9 and 10. CT scan was negative for a dissection. The pain is aggravated on deep inspiration and radiates to the left shoulder. Pericarditis is on the differential as well. -Continue to trend cardiac biomarkers -Serial ECGs -If echo is unremarkable can consider symptomatic treatment for pericarditis.  3) Atrial fibrillation The patient has a history of known AFIB. She is on coumadin and the INR is 2.8. Her  heart rate has been in the 90s and controlled.   4) Elevated BNP The patient has known non-rheumatic severe mitral stenosis and moderate MR. She likely has elevated right heart pressures and the BNP might be reflecting that. Apparently a bedside echo in the ED suggested reduced EF. A formal study on 03/30/2020 had shown mild to moderate AS but severe MS with mean gradient 21 mmHg and moderate MR. EF was 60-65% -Repeat formal echo in the  morning -Daily weights -Strict I&Os -Can give trial of IV Lasix but would be very careful given moderate effusion not to precipitate tamponade physiology with volume depletion.     Meade Maw, MD  11/01/2020, 10:29 PM  Cardiology Overnight Team Please contact [facility] for night-coverage after hours (4p -7a ) and weekends on [email or web address]

## 2020-11-02 ENCOUNTER — Inpatient Hospital Stay (HOSPITAL_COMMUNITY): Payer: Medicare Other

## 2020-11-02 DIAGNOSIS — I10 Essential (primary) hypertension: Secondary | ICD-10-CM

## 2020-11-02 DIAGNOSIS — R079 Chest pain, unspecified: Secondary | ICD-10-CM

## 2020-11-02 DIAGNOSIS — I319 Disease of pericardium, unspecified: Secondary | ICD-10-CM

## 2020-11-02 DIAGNOSIS — I509 Heart failure, unspecified: Secondary | ICD-10-CM

## 2020-11-02 DIAGNOSIS — I313 Pericardial effusion (noninflammatory): Secondary | ICD-10-CM | POA: Diagnosis not present

## 2020-11-02 LAB — CBC WITH DIFFERENTIAL/PLATELET
Abs Immature Granulocytes: 0.12 10*3/uL — ABNORMAL HIGH (ref 0.00–0.07)
Basophils Absolute: 0 10*3/uL (ref 0.0–0.1)
Basophils Relative: 0 %
Eosinophils Absolute: 0 10*3/uL (ref 0.0–0.5)
Eosinophils Relative: 0 %
HCT: 36.3 % (ref 36.0–46.0)
Hemoglobin: 11.8 g/dL — ABNORMAL LOW (ref 12.0–15.0)
Immature Granulocytes: 1 %
Lymphocytes Relative: 4 %
Lymphs Abs: 0.7 10*3/uL (ref 0.7–4.0)
MCH: 32.6 pg (ref 26.0–34.0)
MCHC: 32.5 g/dL (ref 30.0–36.0)
MCV: 100.3 fL — ABNORMAL HIGH (ref 80.0–100.0)
Monocytes Absolute: 2.4 10*3/uL — ABNORMAL HIGH (ref 0.1–1.0)
Monocytes Relative: 14 %
Neutro Abs: 13.6 10*3/uL — ABNORMAL HIGH (ref 1.7–7.7)
Neutrophils Relative %: 81 %
Platelets: 160 10*3/uL (ref 150–400)
RBC: 3.62 MIL/uL — ABNORMAL LOW (ref 3.87–5.11)
RDW: 13 % (ref 11.5–15.5)
WBC: 16.9 10*3/uL — ABNORMAL HIGH (ref 4.0–10.5)
nRBC: 0 % (ref 0.0–0.2)

## 2020-11-02 LAB — BASIC METABOLIC PANEL
Anion gap: 14 (ref 5–15)
BUN: 31 mg/dL — ABNORMAL HIGH (ref 8–23)
CO2: 19 mmol/L — ABNORMAL LOW (ref 22–32)
Calcium: 9.3 mg/dL (ref 8.9–10.3)
Chloride: 95 mmol/L — ABNORMAL LOW (ref 98–111)
Creatinine, Ser: 1.1 mg/dL — ABNORMAL HIGH (ref 0.44–1.00)
GFR, Estimated: 48 mL/min — ABNORMAL LOW (ref >60)
Glucose, Bld: 117 mg/dL — ABNORMAL HIGH (ref 70–99)
Potassium: 5.2 mmol/L — ABNORMAL HIGH (ref 3.5–5.1)
Sodium: 128 mmol/L — ABNORMAL LOW (ref 135–145)

## 2020-11-02 LAB — ECHOCARDIOGRAM COMPLETE
AR max vel: 1.25 cm2
AV Area VTI: 1.19 cm2
AV Area mean vel: 1.26 cm2
AV Mean grad: 11.7 mmHg
AV Peak grad: 22.3 mmHg
Ao pk vel: 2.36 m/s
Area-P 1/2: 1.51 cm2
Height: 71 in
MV M vel: 4.46 m/s
MV Peak grad: 79.6 mmHg
Radius: 0.55 cm
S' Lateral: 2.4 cm
Weight: 2720 oz

## 2020-11-02 LAB — TROPONIN I (HIGH SENSITIVITY): Troponin I (High Sensitivity): 13 ng/L (ref <18)

## 2020-11-02 LAB — URINALYSIS, ROUTINE W REFLEX MICROSCOPIC
Bacteria, UA: NONE SEEN
Bilirubin Urine: NEGATIVE
Glucose, UA: NEGATIVE mg/dL
Ketones, ur: NEGATIVE mg/dL
Leukocytes,Ua: NEGATIVE
Nitrite: NEGATIVE
Protein, ur: NEGATIVE mg/dL
Specific Gravity, Urine: 1.034 — ABNORMAL HIGH (ref 1.005–1.030)
pH: 5 (ref 5.0–8.0)

## 2020-11-02 LAB — HEPATIC FUNCTION PANEL
ALT: 16 U/L (ref 0–44)
AST: 23 U/L (ref 15–41)
Albumin: 3.5 g/dL (ref 3.5–5.0)
Alkaline Phosphatase: 87 U/L (ref 38–126)
Bilirubin, Direct: 1.4 mg/dL — ABNORMAL HIGH (ref 0.0–0.2)
Indirect Bilirubin: 1.5 mg/dL — ABNORMAL HIGH (ref 0.3–0.9)
Total Bilirubin: 2.9 mg/dL — ABNORMAL HIGH (ref 0.3–1.2)
Total Protein: 6.7 g/dL (ref 6.5–8.1)

## 2020-11-02 LAB — LACTIC ACID, PLASMA
Lactic Acid, Venous: 2.1 mmol/L (ref 0.5–1.9)
Lactic Acid, Venous: 2 mmol/L (ref 0.5–1.9)

## 2020-11-02 LAB — SEDIMENTATION RATE: Sed Rate: 46 mm/hr — ABNORMAL HIGH (ref 0–22)

## 2020-11-02 LAB — C-REACTIVE PROTEIN: CRP: 10.7 mg/dL — ABNORMAL HIGH (ref <1.0)

## 2020-11-02 LAB — TSH: TSH: 0.974 u[IU]/mL (ref 0.350–4.500)

## 2020-11-02 MED ORDER — WARFARIN SODIUM 5 MG PO TABS
5.0000 mg | ORAL_TABLET | Freq: Every day | ORAL | Status: DC
  Administered 2020-11-02: 5 mg via ORAL
  Filled 2020-11-02: qty 1

## 2020-11-02 MED ORDER — WARFARIN - PHARMACIST DOSING INPATIENT: Freq: Every day | Status: DC

## 2020-11-02 MED ORDER — COLCHICINE 0.6 MG PO TABS
0.6000 mg | ORAL_TABLET | Freq: Two times a day (BID) | ORAL | Status: DC
  Administered 2020-11-02 (×2): 0.6 mg via ORAL
  Filled 2020-11-02 (×2): qty 1

## 2020-11-02 MED ORDER — ONDANSETRON HCL 4 MG/2ML IJ SOLN
4.0000 mg | Freq: Four times a day (QID) | INTRAMUSCULAR | Status: DC | PRN
  Administered 2020-11-02 – 2020-11-09 (×10): 4 mg via INTRAVENOUS
  Filled 2020-11-02 (×10): qty 2

## 2020-11-02 MED ORDER — SODIUM CHLORIDE 0.9 % IV SOLN
1.0000 g | Freq: Every day | INTRAVENOUS | Status: DC
  Administered 2020-11-02 – 2020-11-03 (×2): 1 g via INTRAVENOUS
  Filled 2020-11-02 (×2): qty 10

## 2020-11-02 MED ORDER — MORPHINE SULFATE (PF) 2 MG/ML IV SOLN
1.0000 mg | INTRAVENOUS | Status: DC | PRN
  Administered 2020-11-02: 1 mg via INTRAVENOUS
  Filled 2020-11-02: qty 1

## 2020-11-02 MED ORDER — LORAZEPAM 2 MG/ML IJ SOLN
0.5000 mg | Freq: Once | INTRAMUSCULAR | Status: AC
  Administered 2020-11-02: 0.5 mg via INTRAVENOUS
  Filled 2020-11-02: qty 1

## 2020-11-02 MED ORDER — ALPRAZOLAM 0.25 MG PO TABS
0.1250 mg | ORAL_TABLET | Freq: Three times a day (TID) | ORAL | Status: DC | PRN
  Administered 2020-11-02 – 2020-11-09 (×7): 0.125 mg via ORAL
  Filled 2020-11-02 (×7): qty 1

## 2020-11-02 MED ORDER — KETOROLAC TROMETHAMINE 0.5 % OP SOLN
1.0000 [drp] | Freq: Two times a day (BID) | OPHTHALMIC | Status: DC
  Administered 2020-11-02 – 2020-11-09 (×15): 1 [drp] via OPHTHALMIC
  Filled 2020-11-02: qty 5

## 2020-11-02 MED ORDER — FENTANYL CITRATE (PF) 100 MCG/2ML IJ SOLN
50.0000 ug | INTRAMUSCULAR | Status: DC | PRN
  Administered 2020-11-02 – 2020-11-03 (×7): 50 ug via INTRAVENOUS
  Filled 2020-11-02 (×7): qty 2

## 2020-11-02 MED ORDER — ENSURE ENLIVE PO LIQD
237.0000 mL | Freq: Two times a day (BID) | ORAL | Status: DC
  Administered 2020-11-03 (×2): 237 mL via ORAL

## 2020-11-02 MED ORDER — HYDROMORPHONE HCL 1 MG/ML IJ SOLN
0.2500 mg | INTRAMUSCULAR | Status: DC | PRN
  Administered 2020-11-02: 0.25 mg via INTRAVENOUS
  Administered 2020-11-02: 0.5 mg via INTRAVENOUS
  Administered 2020-11-02: 0.25 mg via INTRAVENOUS
  Administered 2020-11-03 – 2020-11-09 (×7): 0.5 mg via INTRAVENOUS
  Filled 2020-11-02 (×4): qty 0.5
  Filled 2020-11-02: qty 1
  Filled 2020-11-02 (×5): qty 0.5

## 2020-11-02 NOTE — Progress Notes (Signed)
ANTICOAGULATION CONSULT NOTE - Initial Consult  Pharmacy Consult for Coumadin Indication: atrial fibrillation  Allergies  Allergen Reactions  . Codeine Nausea Only    Patient Measurements: Height: 5\' 11"  (180.3 cm) Weight: 77.1 kg (170 lb) IBW/kg (Calculated) : 70.8  Vital Signs: Temp: 97.5 F (36.4 C) (12/08 1435) Temp Source: Oral (12/08 1435) BP: 104/60 (12/08 1435) Pulse Rate: 101 (12/08 1435)  Labs: Recent Labs    11/01/20 1649 11/01/20 1649 11/01/20 1827 11/01/20 1849 11/01/20 2018 11/02/20 0041 11/02/20 0747  HGB 12.0   < >  --   --  12.6  --  11.8*  HCT 37.2  --   --   --  37.0  --  36.3  PLT 135*  --   --   --   --   --  160  LABPROT  --   --  28.8*  --   --   --   --   INR  --   --  2.8*  --   --   --   --   CREATININE 1.04*  --   --   --  1.00  --  1.10*  TROPONINIHS 9  --   --  10  --  13  --    < > = values in this interval not displayed.    Estimated Creatinine Clearance: 38.8 mL/min (A) (by C-G formula based on SCr of 1.1 mg/dL (H)).   Medical History: Past Medical History:  Diagnosis Date  . Anxiety   . Atrial fibrillation ([place])   . Cancer ([place])   . Cataract    REMOVED BILATERAL  . Chest pain, unspecified   . Chronic low back pain   . Diverticulosis of colon   . History of colon cancer   . History of colonic polyps   . History of colonoscopy   . History of uterine cancer   . Hyperlipidemia   . Hypertension   . Hypothyroid   . Impaired glucose tolerance   . Mitral valve disorder   . Osteoarthritis   . Osteoporosis   . Patent foramen ovale   . Peripheral neuropathy   . Peripheral vascular disease ([place])   . Splenic infarction   . UTI (lower urinary tract infection)   . Venous insufficiency   . Vitamin B12 deficiency    Assessment: CC/HPI: pleuritic CP.CT scan of the chest is negative for aortic dissection.  PMH: Afib,  MS, CKD3, chronic hypoNa, h/o colon cancer, hypothyroid, anemia  Anticoag: Afib. Hgb 11.8 - PTA Coumadin  5mg  daily. Admit INR 2.8  Goal of Therapy:  INR 2-3 Monitor platelets by anticoagulation protocol: Yes   Plan:  Resume home Coumadin 5mg  daily. Daily INR.   Anu Stagner S. Alford Highland, PharmD, BCPS Clinical Staff Pharmacist [email or web address] Alford Highland, [employer] 11/02/2020,2:51 PM

## 2020-11-02 NOTE — ED Notes (Signed)
Please call daughter Maryland Pink @ [phone]--JNP a status update

## 2020-11-02 NOTE — Progress Notes (Signed)
Progress Note  Patient Name: [name] Date of Encounter: 11/02/2020  [facility] Cardiologist: Jenkins Rouge, MD    Subjective   86 year old female with history of atrial fibrillation, moderate aortic stenosis, severe mitral stenosis, moderate mitral regurgitation.  She presents with several days of intense pleuritic chest pain.  CT scan of the chest is negative for aortic dissection. Echocardiogram reveals severe mitral stenosis with at least moderate mitral regurgitation, severe tricuspid regurgitation Moderate aortic stenosis.  Inpatient Medications    Scheduled Meds: . atorvastatin  20 mg Oral Daily  . colchicine  0.6 mg Oral BID  . levothyroxine  100 mcg Oral Q0600  . LORazepam  0.5 mg Intravenous Once  . vitamin B-12  1,000 mcg Oral Daily   Continuous Infusions: . cefTRIAXone (ROCEPHIN)  IV Stopped (11/02/20 0706)   PRN Meds: fentaNYL (SUBLIMAZE) injection, HYDROmorphone (DILAUDID) injection   Vital Signs    Vitals:   11/02/20 1015 11/02/20 1045 11/02/20 1100 11/02/20 1126  BP: 108/80 107/68 100/71 90/78  Pulse: (!) 109 (!) 106 (!) 109 (!) 112  Resp: (!) 22 18 (!) 21 20  Temp:      TempSrc:      SpO2: 94% 93% 92% 92%  Weight:      Height:        Intake/Output Summary (Last 24 hours) at 11/02/2020 1152 Last data filed at 11/02/2020 0710 Gross per 24 hour  Intake 100 ml  Output 200 ml  Net -100 ml   Last 3 Weights 11/02/2020 09/15/2020 05/25/2020  Weight (lbs) 170 lb 171 lb 172 lb 12.8 oz  Weight (kg) 77.111 kg 77.565 kg 78.382 kg      Telemetry    Atrial fib with controlled V response - Personally Reviewed  ECG     - Personally Reviewed  Physical Exam   GEN: elderly female,  Moderate distress, having pleuretic cp  Neck: No JVD Cardiac: irreg. Irreg. 3/6 systolic murmur  Respiratory: Clear to auscultation bilaterally. GI: Soft, nontender, non-distended  MS: No edema; No deformity. Neuro:  Nonfocal  Psych: Normal affect   Labs     High Sensitivity Troponin:   Recent Labs  Lab 11/01/20 1649 11/01/20 1849 11/02/20 0041  TROPONINIHS 9 10 13       Chemistry Recent Labs  Lab 11/01/20 1649 11/01/20 1849 11/01/20 2018 11/02/20 0747  NA 125*  --  126* 128*  K 5.7*  --  5.5* 5.2*  CL 95*  --  95* 95*  CO2 20*  --   --  19*  GLUCOSE 128*  --  136* 117*  BUN 34*  --  33* 31*  CREATININE 1.04*  --  1.00 1.10*  CALCIUM 9.2  --   --  9.3  PROT  --  6.9  --  6.7  ALBUMIN  --  3.6  --  3.5  AST  --  22  --  23  ALT  --  16  --  16  ALKPHOS  --  94  --  87  BILITOT  --  3.2*  --  2.9*  GFRNONAA 51*  --   --  48*  ANIONGAP 10  --   --  14     Hematology Recent Labs  Lab 11/01/20 1649 11/01/20 2018 11/02/20 0747  WBC 12.6*  --  16.9*  RBC 3.72*  --  3.62*  HGB 12.0 12.6 11.8*  HCT 37.2 37.0 36.3  MCV 100.0  --  100.3*  MCH 32.3  --  32.6  MCHC 32.3  --  32.5  RDW 13.0  --  13.0  PLT 135*  --  160    BNP Recent Labs  Lab 11/01/20 1827  BNP 672.0*     DDimer No results for input(s): DDIMER in the last 168 hours.   Radiology    DG Chest 2 View  Result Date: 11/01/2020 CLINICAL DATA:  Chest pain EXAM: CHEST - 2 VIEW COMPARISON:  12/07/2019 and prior. FINDINGS: Left predominant patchy bibasilar opacities. No pneumothorax or pleural effusion. Cardiomegaly and central pulmonary vascular congestion, unchanged. Multilevel spondylosis. Osteopenia. IMPRESSION: Cardiomegaly and central pulmonary vascular congestion. Patchy bibasilar opacities. Differential includes atelectasis, infection or edema. Electronically Signed   By: Primitivo Gauze M.D.   On: 11/01/2020 17:37   CT Angio Chest/Abd/Pel for Dissection W and/or Wo Contrast  Result Date: 11/01/2020 CLINICAL DATA:  Abdominal pain.  Chest pain.  Shortness of breath. EXAM: CT ANGIOGRAPHY CHEST, ABDOMEN AND PELVIS TECHNIQUE: Non-contrast CT of the chest was initially obtained. Multidetector CT imaging through the chest, abdomen and pelvis was  performed using the standard protocol during bolus administration of intravenous contrast. Multiplanar reconstructed images and MIPs were obtained and reviewed to evaluate the vascular anatomy. CONTRAST:  115mL OMNIPAQUE IOHEXOL 350 MG/ML SOLN COMPARISON:  February 17, 2016 FINDINGS: CTA CHEST FINDINGS Cardiovascular: There is significant cardiomegaly with severe biatrial enlargement. There are atherosclerotic changes of the thoracic aorta without evidence for an aneurysm or dissection. There are coronary artery calcifications. There is a moderate-sized pericardial effusion. There is no evidence for a large centrally located pulmonary embolism. There is reflux of contrast into the IVC. Mediastinum/Nodes: -- No mediastinal lymphadenopathy. -- No hilar lymphadenopathy. -- No axillary lymphadenopathy. -- No supraclavicular lymphadenopathy. -- Normal thyroid gland where visualized. -  Unremarkable esophagus. Lungs/Pleura: There are trace to small bilateral pleural effusions with adjacent atelectasis, left worse than right. There is a mosaic appearance of the lung parenchyma bilaterally. There is a ground-glass airspace opacity in the left upper lobe measuring approximately 6 mm (axial series 11, image 35). There is bilateral interlobular septal thickening the trachea is unremarkable. Musculoskeletal: No chest wall abnormality. No bony spinal canal stenosis. Review of the MIP images confirms the above findings. CTA ABDOMEN AND PELVIS FINDINGS VASCULAR Aorta: There are atherosclerotic changes of the abdominal aorta without evidence for an aneurysm or dissection. Celiac: There is moderate narrowing at the origin of the celiac axis. SMA: Patent without evidence of aneurysm, dissection, vasculitis or significant stenosis. Renals: Both renal arteries are patent without evidence of aneurysm, dissection, vasculitis, fibromuscular dysplasia or significant stenosis. There are 2 right renal arteries. IMA: Patent without evidence of  aneurysm, dissection, vasculitis or significant stenosis. Inflow: Patent without evidence of aneurysm, dissection, vasculitis or significant stenosis. Veins: No obvious venous abnormality within the limitations of this arterial phase study. Review of the MIP images confirms the above findings. NON-VASCULAR Hepatobiliary: The liver is enlarged and somewhat nodular in appearance. There is suggestion of hepatic venous congestion which may be secondary to right-sided heart failure. Normal gallbladder.There is no biliary ductal dilation. Pancreas: Normal contours without ductal dilatation. No peripancreatic fluid collection. Spleen: The spleen is enlarged measuring 13 cm craniocaudad. Adrenals/Urinary Tract: --Adrenal glands: Unremarkable. --Right kidney/ureter: No hydronephrosis or radiopaque kidney stones. --Left kidney/ureter: No hydronephrosis or radiopaque kidney stones. --Urinary bladder: Unremarkable. Stomach/Bowel: --Stomach/Duodenum: No hiatal hernia or other gastric abnormality. Normal duodenal course and caliber. --Small bowel: Unremarkable. --Colon: Rectosigmoid diverticulosis without acute inflammation. The patient appears to be status  post prior right hemicolectomy. --Appendix: Surgically absent. Lymphatic: --No retroperitoneal lymphadenopathy. --No mesenteric lymphadenopathy. --No pelvic or inguinal lymphadenopathy. Reproductive: Status post hysterectomy. No adnexal mass. Other: There is a small amount of free fluid in the abdomen and pelvis. The abdominal wall is normal. Musculoskeletal. No acute displaced fractures. Review of the MIP images confirms the above findings. IMPRESSION: 1. No evidence for aortic dissection or aneurysm. 2. Cardiomegaly with severe biatrial enlargement. There is a moderate-sized pericardial effusion. 3. Findings of congestive heart failure with developing pulmonary edema. 4. Ground-glass airspace opacity in the left upper lobe measuring approximately 6 mm. This may be secondary  to underlying pulmonary edema. Follow-up recommendations are as follows. Initial follow-up with CT at 6-12 months is recommended to confirm persistence. If persistent, repeat CT is recommended every 2 years until 5 years of stability has been established. This recommendation follows the consensus statement: Guidelines for Management of Incidental Pulmonary Nodules Detected on CT Images: From the Fleischner Society 2017; Radiology 2017; 284:228-243. Aortic Atherosclerosis (ICD10-I70.0). Electronically Signed   By: Constance Holster M.D.   On: 11/01/2020 19:52    Cardiac Studies      Patient Profile     86 y.o. female with severe valvular disease   Assessment & Plan    1.  Severe pleuritic chest pain: The patient has been given some Dilaudid which is helped with her severe pleuritic chest pain.  CT scan was negative for dissection.  I will try some colchicine.  If this does not work out have a low threshold to try some steroids. She had a cast showing normal coronary arteries a year ago.   2.  Severe mitral stenosis: Valve is very calcified. She is not a Candidate for blood valvuloplasty.         For questions or updates, please contact [place] Please consult [email or web address] for contact info under        Signed, Mertie Moores, MD  11/02/2020, 11:52 AM

## 2020-11-02 NOTE — Progress Notes (Signed)
PROGRESS NOTE    [name]  OEU:[id number] DOB: August 24, 1929 DOA: 11/01/2020 PCP: Laurey Morale, MD   Brief Narrative:  HPI on 11/01/2020 by Dr. Gean Birchwood [name] is a 86 y.o. female with history of A. fib, mitral stenosis, chronic kidney disease stage III, chronic hyponatremia, history of colon cancer, hypothyroidism, anemia presents to the ER because of chest pain.  Patient has been having chest pain for the last 1 week mostly in the center of the chest radiating to the left arm.  Patient did have some dysuria over the last few days and had gone to the urgent care center on Saturday about 4 days ago and was given antibiotics which patient has been taking for last 3 days.  Her dysuria has improved.  But due to worsening chest pain which is pleuritic in nature radiating to her left shoulder patient presents to the ER.  Denies any nausea vomiting diarrhea has some shortness of breath.  Felt weak.  Patient received her COVID-19 booster shot vaccination a month ago exactly.  Assessment & Plan   Chest pain likely secondary to moderate pericardial effusion -CT chest no evidence of aortic dissection or aneurysm.  Cardiomegaly with severe biatrial enlargement, moderate sized pericardial effusion. -Cardiology consulted and appreciated -Currently no signs of tamponade -Pending echocardiogram -Continue pain control, currently on fentanyl every 2 hours however this has been unable to control her pain.  We will add on morphine as well  Severe mitral stenosis/elevated BNP -CT scan shows findings suggestive of heart failure developing pulmonary edema -BNP 672 -Last echocardiogram Mar 30, 2020 shows an EF of 60 to 65%.  LV diastolic function could not be evaluated. -Patient was given 1 dose of IV Lasix -Blood pressures have been soft -Cardiology consulted and appreciated -Pending echocardiogram  Chronic kidney disease, stage IIIa with hyperkalemia -As above, patient did receive 1 dose  of IV Lasix in the emergency department -Potassium remains elevated at 5.2 today -Continue to monitor BMP  Chronic hyponatremia -Sodium currently 128 (baseline approximately 130-134) -Continue to monitor BMP  Chronic anemia -Hemoglobin stable -Continue to monitor CBC  Atrial fibrillation -Coumadin currently held until tamponade is ruled out on echocardiogram -Currently patient is tachycardic in the low 100s (106)-pain may also be contributing to this -INR was 2.8  Essential hypertension -BP has been soft -Antihypertensive medications have been held  UTI -Patient was recently treated for urinary tract infection however currently is asymptomatic -Patient was treated with Keflex as an outpatient  Hypothyroidism -Continue Synthroid  Thrombocytopenia -resolved, continue to monitor CBC  Lung opacity -Noted on CT chest, groundglass airspace opacity left upper lobe measuring 6 mm -May be secondary to pulmonary edema. -Follow-up recommendations for repeat CT in 6 to 12 months  DVT Prophylaxis INR therapeutic.  Anticoagulation held until echocardiogram is obtained  Code Status: DNR  Family Communication: None at bedside  Disposition Plan:  Status is: Inpatient  Remains inpatient appropriate because:Ongoing active pain requiring inpatient pain management, Ongoing diagnostic testing needed not appropriate for outpatient work up, IV treatments appropriate due to intensity of illness or inability to take PO and Inpatient level of care appropriate due to severity of illness   Dispo: The patient is from: Home              Anticipated d/c is to: Home              Anticipated d/c date is: 3 days  Patient currently is not medically stable to d/c.  Consultants Cardiology  Procedures  None  Antibiotics   Anti-infectives (From admission, onward)   Start     Dose/Rate Route Frequency Ordered Stop   11/02/20 0500  cefTRIAXone (ROCEPHIN) 1 g in sodium chloride 0.9 %  100 mL IVPB        1 g 200 mL/hr over 30 Minutes Intravenous Daily 11/02/20 0446        Subjective:   [name] seen and examined today.  Continues to complain of pain in her chest which radiates to her back and up her neck.  Denies current shortness of breath.  States her pain is worse with deep breathing and movement.  Denies current dizziness or headache.  Does complain of some nausea without vomiting.  Objective:   Vitals:   11/02/20 0715 11/02/20 0745 11/02/20 0830 11/02/20 0845  BP: 99/70 94/61 103/82 104/71  Pulse: (!) 103 (!) 112 (!) 113 (!) 106  Resp: (!) 21 (!) 25 (!) 25 (!) 25  Temp:      TempSrc:      SpO2: 95% 98% 92% 95%  Weight:      Height:        Intake/Output Summary (Last 24 hours) at 11/02/2020 7672 Last data filed at 11/02/2020 0710 Gross per 24 hour  Intake 100 ml  Output 200 ml  Net -100 ml   Filed Weights   11/02/20 0706  Weight: 77.1 kg    Exam  General: Well developed, elderly, mild distress due to pain  HEENT: NCAT, mucous membranes moist.   Cardiovascular: S1 S2 auscultated, irregular, 2/6 SEM  Respiratory: Clear to auscultation bilaterally   Abdomen: Soft, nontender, nondistended, + bowel sounds  Extremities: warm dry without cyanosis clubbing. Chronic stasis dermatitis.  Trace LE edema B/L  Neuro: AAOx3, nonfocal  Psych: Anxious however appropriate (due to pain)   Data Reviewed: I have personally reviewed following labs and imaging studies  CBC: Recent Labs  Lab 11/01/20 1649 11/01/20 2018 11/02/20 0747  WBC 12.6*  --  16.9*  NEUTROABS  --   --  13.6*  HGB 12.0 12.6 11.8*  HCT 37.2 37.0 36.3  MCV 100.0  --  100.3*  PLT 135*  --  094   Basic Metabolic Panel: Recent Labs  Lab 11/01/20 1649 11/01/20 2018 11/02/20 0747  NA 125* 126* 128*  K 5.7* 5.5* 5.2*  CL 95* 95* 95*  CO2 20*  --  19*  GLUCOSE 128* 136* 117*  BUN 34* 33* 31*  CREATININE 1.04* 1.00 1.10*  CALCIUM 9.2  --  9.3   GFR: Estimated  Creatinine Clearance: 38.8 mL/min (A) (by C-G formula based on SCr of 1.1 mg/dL (H)). Liver Function Tests: Recent Labs  Lab 11/01/20 1849 11/02/20 0747  AST 22 23  ALT 16 16  ALKPHOS 94 87  BILITOT 3.2* 2.9*  PROT 6.9 6.7  ALBUMIN 3.6 3.5   Recent Labs  Lab 11/01/20 1849  LIPASE 25   No results for input(s): AMMONIA in the last 168 hours. Coagulation Profile: Recent Labs  Lab 11/01/20 1827  INR 2.8*   Cardiac Enzymes: No results for input(s): CKTOTAL, CKMB, CKMBINDEX, TROPONINI in the last 168 hours. BNP (last 3 results) No results for input(s): PROBNP in the last 8760 hours. HbA1C: No results for input(s): HGBA1C in the last 72 hours. CBG: No results for input(s): GLUCAP in the last 168 hours. Lipid Profile: No results for input(s): CHOL, HDL, LDLCALC, TRIG, CHOLHDL,  LDLDIRECT in the last 72 hours. Thyroid Function Tests: Recent Labs    11/02/20 0041  TSH 0.974   Anemia Panel: No results for input(s): VITAMINB12, FOLATE, FERRITIN, TIBC, IRON, RETICCTPCT in the last 72 hours. Urine analysis:    Component Value Date/Time   COLORURINE YELLOW 11/02/2020 0542   APPEARANCEUR CLEAR 11/02/2020 0542   LABSPEC 1.034 (H) 11/02/2020 0542   PHURINE 5.0 11/02/2020 0542   GLUCOSEU NEGATIVE 11/02/2020 0542   GLUCOSEU NEGATIVE 07/15/2008 1258   HGBUR SMALL (A) 11/02/2020 0542   BILIRUBINUR NEGATIVE 11/02/2020 0542   BILIRUBINUR negative 05/25/2020 1430   KETONESUR NEGATIVE 11/02/2020 0542   PROTEINUR NEGATIVE 11/02/2020 0542   UROBILINOGEN 0.2 05/25/2020 1430   UROBILINOGEN 0.2 09/12/2014 1810   NITRITE NEGATIVE 11/02/2020 0542   LEUKOCYTESUR NEGATIVE 11/02/2020 0542   Sepsis Labs: @LABRCNTIP (procalcitonin:4,lacticidven:4)  ) Recent Results (from the past 240 hour(s))  Resp Panel by RT-PCR (Flu A&B, Covid) Nasopharyngeal Swab     Status: None   Collection Time: 11/01/20  7:58 PM   Specimen: Nasopharyngeal Swab; Nasopharyngeal(NP) swabs in vial transport medium   Result Value Ref Range Status   SARS Coronavirus 2 by RT PCR NEGATIVE NEGATIVE Final    Comment: (NOTE) SARS-CoV-2 target nucleic acids are NOT DETECTED.  The SARS-CoV-2 RNA is generally detectable in upper respiratory specimens during the acute phase of infection. The lowest concentration of SARS-CoV-2 viral copies this assay can detect is 138 copies/mL. A negative result does not preclude SARS-Cov-2 infection and should not be used as the sole basis for treatment or other patient management decisions. A negative result may occur with  improper specimen collection/handling, submission of specimen other than nasopharyngeal swab, presence of viral mutation(s) within the areas targeted by this assay, and inadequate number of viral copies(<138 copies/mL). A negative result must be combined with clinical observations, patient history, and epidemiological information. The expected result is Negative.  Fact Sheet for Patients:  EntrepreneurPulse.com.au  Fact Sheet for Healthcare Providers:  IncredibleEmployment.be  This test is no t yet approved or cleared by the [place] FDA and  has been authorized for detection and/or diagnosis of SARS-CoV-2 by FDA under an Emergency Use Authorization (EUA). This EUA will remain  in effect (meaning this test can be used) for the duration of the COVID-19 declaration under Section 564(b)(1) of the Act, 21 U.S.C.section 360bbb-3(b)(1), unless the authorization is terminated  or revoked sooner.       Influenza A by PCR NEGATIVE NEGATIVE Final   Influenza B by PCR NEGATIVE NEGATIVE Final    Comment: (NOTE) The Xpert Xpress SARS-CoV-2/FLU/RSV plus assay is intended as an aid in the diagnosis of influenza from Nasopharyngeal swab specimens and should not be used as a sole basis for treatment. Nasal washings and aspirates are unacceptable for Xpert Xpress SARS-CoV-2/FLU/RSV testing.  Fact Sheet for  Patients: EntrepreneurPulse.com.au  Fact Sheet for Healthcare Providers: IncredibleEmployment.be  This test is not yet approved or cleared by the [place] FDA and has been authorized for detection and/or diagnosis of SARS-CoV-2 by FDA under an Emergency Use Authorization (EUA). This EUA will remain in effect (meaning this test can be used) for the duration of the COVID-19 declaration under Section 564(b)(1) of the Act, 21 U.S.C. section 360bbb-3(b)(1), unless the authorization is terminated or revoked.  Performed at [facility], [place] [address] [place], [place] [address]       Radiology Studies: DG Chest 2 View  Result Date: 11/01/2020 CLINICAL DATA:  Chest pain EXAM:  CHEST - 2 VIEW COMPARISON:  12/07/2019 and prior. FINDINGS: Left predominant patchy bibasilar opacities. No pneumothorax or pleural effusion. Cardiomegaly and central pulmonary vascular congestion, unchanged. Multilevel spondylosis. Osteopenia. IMPRESSION: Cardiomegaly and central pulmonary vascular congestion. Patchy bibasilar opacities. Differential includes atelectasis, infection or edema. Electronically Signed   By: Primitivo Gauze M.D.   On: 11/01/2020 17:37   CT Angio Chest/Abd/Pel for Dissection W and/or Wo Contrast  Result Date: 11/01/2020 CLINICAL DATA:  Abdominal pain.  Chest pain.  Shortness of breath. EXAM: CT ANGIOGRAPHY CHEST, ABDOMEN AND PELVIS TECHNIQUE: Non-contrast CT of the chest was initially obtained. Multidetector CT imaging through the chest, abdomen and pelvis was performed using the standard protocol during bolus administration of intravenous contrast. Multiplanar reconstructed images and MIPs were obtained and reviewed to evaluate the vascular anatomy. CONTRAST:  182mL OMNIPAQUE IOHEXOL 350 MG/ML SOLN COMPARISON:  February 17, 2016 FINDINGS: CTA CHEST FINDINGS Cardiovascular: There is significant cardiomegaly with severe biatrial enlargement. There are  atherosclerotic changes of the thoracic aorta without evidence for an aneurysm or dissection. There are coronary artery calcifications. There is a moderate-sized pericardial effusion. There is no evidence for a large centrally located pulmonary embolism. There is reflux of contrast into the IVC. Mediastinum/Nodes: -- No mediastinal lymphadenopathy. -- No hilar lymphadenopathy. -- No axillary lymphadenopathy. -- No supraclavicular lymphadenopathy. -- Normal thyroid gland where visualized. -  Unremarkable esophagus. Lungs/Pleura: There are trace to small bilateral pleural effusions with adjacent atelectasis, left worse than right. There is a mosaic appearance of the lung parenchyma bilaterally. There is a ground-glass airspace opacity in the left upper lobe measuring approximately 6 mm (axial series 11, image 35). There is bilateral interlobular septal thickening the trachea is unremarkable. Musculoskeletal: No chest wall abnormality. No bony spinal canal stenosis. Review of the MIP images confirms the above findings. CTA ABDOMEN AND PELVIS FINDINGS VASCULAR Aorta: There are atherosclerotic changes of the abdominal aorta without evidence for an aneurysm or dissection. Celiac: There is moderate narrowing at the origin of the celiac axis. SMA: Patent without evidence of aneurysm, dissection, vasculitis or significant stenosis. Renals: Both renal arteries are patent without evidence of aneurysm, dissection, vasculitis, fibromuscular dysplasia or significant stenosis. There are 2 right renal arteries. IMA: Patent without evidence of aneurysm, dissection, vasculitis or significant stenosis. Inflow: Patent without evidence of aneurysm, dissection, vasculitis or significant stenosis. Veins: No obvious venous abnormality within the limitations of this arterial phase study. Review of the MIP images confirms the above findings. NON-VASCULAR Hepatobiliary: The liver is enlarged and somewhat nodular in appearance. There is  suggestion of hepatic venous congestion which may be secondary to right-sided heart failure. Normal gallbladder.There is no biliary ductal dilation. Pancreas: Normal contours without ductal dilatation. No peripancreatic fluid collection. Spleen: The spleen is enlarged measuring 13 cm craniocaudad. Adrenals/Urinary Tract: --Adrenal glands: Unremarkable. --Right kidney/ureter: No hydronephrosis or radiopaque kidney stones. --Left kidney/ureter: No hydronephrosis or radiopaque kidney stones. --Urinary bladder: Unremarkable. Stomach/Bowel: --Stomach/Duodenum: No hiatal hernia or other gastric abnormality. Normal duodenal course and caliber. --Small bowel: Unremarkable. --Colon: Rectosigmoid diverticulosis without acute inflammation. The patient appears to be status post prior right hemicolectomy. --Appendix: Surgically absent. Lymphatic: --No retroperitoneal lymphadenopathy. --No mesenteric lymphadenopathy. --No pelvic or inguinal lymphadenopathy. Reproductive: Status post hysterectomy. No adnexal mass. Other: There is a small amount of free fluid in the abdomen and pelvis. The abdominal wall is normal. Musculoskeletal. No acute displaced fractures. Review of the MIP images confirms the above findings. IMPRESSION: 1. No evidence for aortic dissection or aneurysm. 2. Cardiomegaly  with severe biatrial enlargement. There is a moderate-sized pericardial effusion. 3. Findings of congestive heart failure with developing pulmonary edema. 4. Ground-glass airspace opacity in the left upper lobe measuring approximately 6 mm. This may be secondary to underlying pulmonary edema. Follow-up recommendations are as follows. Initial follow-up with CT at 6-12 months is recommended to confirm persistence. If persistent, repeat CT is recommended every 2 years until 5 years of stability has been established. This recommendation follows the consensus statement: Guidelines for Management of Incidental Pulmonary Nodules Detected on CT Images:  From the Fleischner Society 2017; Radiology 2017; 284:228-243. Aortic Atherosclerosis (ICD10-I70.0). Electronically Signed   By: Constance Holster M.D.   On: 11/01/2020 19:52     Scheduled Meds: . atorvastatin  20 mg Oral Daily  . levothyroxine  100 mcg Oral Q0600  . vitamin B-12  1,000 mcg Oral Daily   Continuous Infusions: . cefTRIAXone (ROCEPHIN)  IV Stopped (11/02/20 0706)     LOS: 1 day   Time Spent in minutes   45 minutes  Karolyn Messing D.O. on 11/02/2020 at 9:23 AM  Between 7am to 7pm - Please see pager noted on [email or web address]  After 7pm go to [email or web address]  And look for the night coverage person covering for me after hours  [facility] Office  [phone]

## 2020-11-02 NOTE — ED Notes (Signed)
Mordecai Maes niece, requesting to be contacted with any/all updates.

## 2020-11-02 NOTE — Progress Notes (Signed)
  Echocardiogram 2D Echocardiogram has been performed.  [name] 11/02/2020, 11:20 AM

## 2020-11-02 NOTE — ED Notes (Signed)
Breakfast ordered 

## 2020-11-03 ENCOUNTER — Ambulatory Visit: Payer: Medicare Other | Admitting: Family Medicine

## 2020-11-03 LAB — CBC
HCT: 33.3 % — ABNORMAL LOW (ref 36.0–46.0)
Hemoglobin: 11.3 g/dL — ABNORMAL LOW (ref 12.0–15.0)
MCH: 32.9 pg (ref 26.0–34.0)
MCHC: 33.9 g/dL (ref 30.0–36.0)
MCV: 97.1 fL (ref 80.0–100.0)
Platelets: 161 10*3/uL (ref 150–400)
RBC: 3.43 MIL/uL — ABNORMAL LOW (ref 3.87–5.11)
RDW: 13 % (ref 11.5–15.5)
WBC: 11.8 10*3/uL — ABNORMAL HIGH (ref 4.0–10.5)
nRBC: 0 % (ref 0.0–0.2)

## 2020-11-03 LAB — PROTIME-INR
INR: 3.2 — ABNORMAL HIGH (ref 0.8–1.2)
Prothrombin Time: 31.4 seconds — ABNORMAL HIGH (ref 11.4–15.2)

## 2020-11-03 LAB — BASIC METABOLIC PANEL
Anion gap: 10 (ref 5–15)
BUN: 47 mg/dL — ABNORMAL HIGH (ref 8–23)
CO2: 22 mmol/L (ref 22–32)
Calcium: 9.3 mg/dL (ref 8.9–10.3)
Chloride: 94 mmol/L — ABNORMAL LOW (ref 98–111)
Creatinine, Ser: 1.63 mg/dL — ABNORMAL HIGH (ref 0.44–1.00)
GFR, Estimated: 30 mL/min — ABNORMAL LOW (ref >60)
Glucose, Bld: 125 mg/dL — ABNORMAL HIGH (ref 70–99)
Potassium: 5.5 mmol/L — ABNORMAL HIGH (ref 3.5–5.1)
Sodium: 126 mmol/L — ABNORMAL LOW (ref 135–145)

## 2020-11-03 MED ORDER — SODIUM CHLORIDE 0.9 % IV SOLN: INTRAVENOUS | Status: DC

## 2020-11-03 MED ORDER — COLCHICINE 0.6 MG PO TABS
0.6000 mg | ORAL_TABLET | Freq: Every day | ORAL | Status: DC
  Administered 2020-11-04 – 2020-11-05 (×2): 0.6 mg via ORAL
  Filled 2020-11-03 (×2): qty 1

## 2020-11-03 MED ORDER — COLCHICINE 0.6 MG PO TABS
0.6000 mg | ORAL_TABLET | Freq: Every day | ORAL | Status: DC
  Administered 2020-11-03: 0.6 mg via ORAL
  Filled 2020-11-03: qty 1

## 2020-11-03 MED ORDER — PANTOPRAZOLE SODIUM 40 MG PO TBEC
40.0000 mg | DELAYED_RELEASE_TABLET | Freq: Every day | ORAL | Status: DC
  Administered 2020-11-03 – 2020-11-09 (×7): 40 mg via ORAL
  Filled 2020-11-03 (×8): qty 1

## 2020-11-03 MED ORDER — WARFARIN SODIUM 2 MG PO TABS
2.0000 mg | ORAL_TABLET | Freq: Once | ORAL | Status: AC
  Filled 2020-11-03: qty 1

## 2020-11-03 MED ORDER — HYDROCODONE-ACETAMINOPHEN 5-325 MG PO TABS
1.0000 | ORAL_TABLET | ORAL | Status: DC | PRN
  Administered 2020-11-03: 1 via ORAL
  Administered 2020-11-04: 2 via ORAL
  Administered 2020-11-06 – 2020-11-07 (×2): 1 via ORAL
  Filled 2020-11-03 (×2): qty 1
  Filled 2020-11-03: qty 2
  Filled 2020-11-03: qty 1

## 2020-11-03 MED ORDER — ADULT MULTIVITAMIN W/MINERALS CH
1.0000 | ORAL_TABLET | Freq: Every day | ORAL | Status: DC
  Administered 2020-11-04 – 2020-11-09 (×6): 1 via ORAL
  Filled 2020-11-03 (×7): qty 1

## 2020-11-03 MED ORDER — SODIUM ZIRCONIUM CYCLOSILICATE 5 G PO PACK
5.0000 g | PACK | Freq: Two times a day (BID) | ORAL | Status: AC
  Administered 2020-11-03 – 2020-11-04 (×2): 5 g via ORAL
  Filled 2020-11-03 (×2): qty 1

## 2020-11-03 MED ORDER — ENSURE ENLIVE PO LIQD
237.0000 mL | Freq: Three times a day (TID) | ORAL | Status: DC
  Administered 2020-11-05 – 2020-11-06 (×4): 237 mL via ORAL

## 2020-11-03 MED ORDER — POLYVINYL ALCOHOL 1.4 % OP SOLN
1.0000 [drp] | Freq: Two times a day (BID) | OPHTHALMIC | Status: DC
  Administered 2020-11-04 – 2020-11-09 (×11): 1 [drp] via OPHTHALMIC
  Filled 2020-11-03: qty 15

## 2020-11-03 NOTE — Progress Notes (Signed)
Initial Nutrition Assessment  DOCUMENTATION CODES:   Non-severe (moderate) malnutrition in context of chronic illness  INTERVENTION:   -Continue Ensure Enlive po to TID, each supplement provides 350 kcal and 20 grams of protein   NUTRITION DIAGNOSIS:   Moderate Malnutrition related to chronic illness (mitral stenosis) as evidenced by mild fat depletion,mild muscle depletion,moderate muscle depletion.  GOAL:   Patient will meet greater than or equal to 90% of their needs  MONITOR:   PO intake,Supplement acceptance,Labs,Weight trends,Skin,I & O's  REASON FOR ASSESSMENT:   Malnutrition Screening Tool    ASSESSMENT:   [name] is a 86 y.o. female with history of A. fib, mitral stenosis, chronic kidney disease stage III, chronic hyponatremia, history of colon cancer, hypothyroidism, anemia presents to the ER because of chest pain.  Patient has been having chest pain for the last 1 week mostly in the center of the chest radiating to the left arm.  Patient did have some dysuria over the last few days and had gone to the urgent care center on Saturday about 4 days ago and was given antibiotics which patient has been taking for last 3 days.  Her dysuria has improved.  But due to worsening chest pain which is pleuritic in nature radiating to her left shoulder patient presents to the ER.  Denies any nausea vomiting diarrhea has some shortness of breath.  Felt weak.  Patient received her COVID-19 booster shot vaccination a month ago exactly.  Pt admitted with chest pain secondary to moderate pericardial effusion.   Reviewed I/O's: -388 ml x 24 hours  UOP: 900 ml x 24 hours  Spoke with pt at bedside, who complains of severe pain. She reports very poor appetite, consumed only a few bites of breakfast and some Ensure Enlive. She estimates she has had a poor appetite over the past month, but unable to provide a diet recall. Noted meal completion 25-50%.   Pt endorses weight loss. She  reports her UBW is around 165-167#, however, suspects that weight loss is being masked for fluid retention. Reviewed wt hx; wt has been stable over the past year.   Discussed with pt importance of good meal and supplement intake to promote healing. She is amenable to continue Ensure supplements.   Medications reviewed and include lokelma, vitamin B-12, and 0.9% sodium chloride infusion @ 50 ml/hr.  Labs reviewed: Na: 126, K: 5.5.   NUTRITION - FOCUSED PHYSICAL EXAM:  Flowsheet Row Most Recent Value  Orbital Region Mild depletion  Upper Arm Region Mild depletion  Thoracic and Lumbar Region No depletion  Buccal Region Mild depletion  Temple Region Mild depletion  Clavicle Bone Region No depletion  Clavicle and Acromion Bone Region No depletion  Scapular Bone Region No depletion  Dorsal Hand Moderate depletion  Patellar Region Mild depletion  Anterior Thigh Region Mild depletion  Posterior Calf Region Mild depletion  Edema (RD Assessment) None  Hair Reviewed  Eyes Reviewed  Mouth Reviewed  Skin Reviewed  Nails Reviewed       Diet Order:   Diet Order            Diet Heart Room service appropriate? Yes; Fluid consistency: Thin; Fluid restriction: 1200 mL Fluid  Diet effective now                 EDUCATION NEEDS:   Education needs have been addressed  Skin:  Skin Assessment: Reviewed RN Assessment  Last BM:  11/01/20  Height:   Ht Readings from  Last 1 Encounters:  11/02/20 5\' 11"  (1.803 m)    Weight:   Wt Readings from Last 1 Encounters:  11/03/20 77.5 kg    Ideal Body Weight:  75.5 kg  BMI:  Body mass index is 23.82 kg/m.  Estimated Nutritional Needs:   Kcal:  1700-1900  Protein:  85-100 grams  Fluid:  > 1.7 L    Loistine Chance, RD, LDN, [place] Registered Dietitian II Certified Diabetes Care and Education Specialist Please refer to [facility] for RD and/or RD on-call/weekend/after hours pager

## 2020-11-03 NOTE — Progress Notes (Addendum)
Progress Note  Patient Name: [name] Date of Encounter: 11/03/2020  [facility] Cardiologist: Jenkins Rouge, MD   Subjective   86 year old female with history of atrial fibrillation, moderate aortic stenosis, severe mitral stenosis, moderate mitral regurgitation.  She presents with several days of intense pleuritic chest pain.  CT scan of the chest is negative for aortic dissection. Echocardiogram reveals severe mitral stenosis with at least moderate mitral regurgitation, severe tricuspid regurgitation Pericardial effusion Moderate aortic stenosis  Chest pain is more controlled but continues SOB is stable  Inpatient Medications    Scheduled Meds: . atorvastatin  20 mg Oral Daily  . colchicine  0.6 mg Oral Daily  . feeding supplement  237 mL Oral BID BM  . ketorolac  1 drop Right Eye BID  . levothyroxine  100 mcg Oral Q0600  . sodium zirconium cyclosilicate  5 g Oral BID  . vitamin B-12  1,000 mcg Oral Daily  . warfarin  2 mg Oral ONCE-1600  . Warfarin - Pharmacist Dosing Inpatient   Does not apply q1600   Continuous Infusions: . sodium chloride 50 mL/hr at 11/03/20 0851  . cefTRIAXone (ROCEPHIN)  IV Stopped (11/02/20 0706)   PRN Meds: ALPRAZolam, fentaNYL (SUBLIMAZE) injection, HYDROcodone-acetaminophen, HYDROmorphone (DILAUDID) injection, ondansetron (ZOFRAN) IV   Vital Signs    Vitals:   11/03/20 0300 11/03/20 0551 11/03/20 0824 11/03/20 0835  BP:  105/76 98/70   Pulse:  100 93   Resp:  14  20  Temp:  97.7 F (36.5 C) 97.7 F (36.5 C)   TempSrc:  Oral Oral   SpO2:  93% 100%   Weight: 77.5 kg     Height:        Intake/Output Summary (Last 24 hours) at 11/03/2020 0946 Last data filed at 11/03/2020 7829 Gross per 24 hour  Intake 762 ml  Output 700 ml  Net 62 ml   Last 3 Weights 11/03/2020 11/02/2020 09/15/2020  Weight (lbs) 170 lb 12.8 oz 170 lb 171 lb  Weight (kg) 77.474 kg 77.111 kg 77.565 kg      Telemetry    Atrial fib mostly rate  controlled at times to 121 - Personally Reviewed  ECG    No new - Personally Reviewed  Physical Exam   GEN: No acute distress.   Neck: No JVD Cardiac: irreg irreg, III/VI murmur, no rubs, or gallops.  Respiratory: rales to auscultation bilaterally. GI: Soft, nontender, non-distended  MS: No edema; No deformity. Neuro:  Nonfocal  Psych: Normal affect   Labs    High Sensitivity Troponin:   Recent Labs  Lab 11/01/20 1649 11/01/20 1849 11/02/20 0041  TROPONINIHS 9 10 13       Chemistry Recent Labs  Lab 11/01/20 1649 11/01/20 1849 11/01/20 2018 11/02/20 0747 11/03/20 0312  NA 125*  --  126* 128* 126*  K 5.7*  --  5.5* 5.2* 5.5*  CL 95*  --  95* 95* 94*  CO2 20*  --   --  19* 22  GLUCOSE 128*  --  136* 117* 125*  BUN 34*  --  33* 31* 47*  CREATININE 1.04*  --  1.00 1.10* 1.63*  CALCIUM 9.2  --   --  9.3 9.3  PROT  --  6.9  --  6.7  --   ALBUMIN  --  3.6  --  3.5  --   AST  --  22  --  23  --   ALT  --  16  --  16  --   ALKPHOS  --  94  --  87  --   BILITOT  --  3.2*  --  2.9*  --   GFRNONAA 51*  --   --  48* 30*  ANIONGAP 10  --   --  14 10     Hematology Recent Labs  Lab 11/01/20 1649 11/01/20 2018 11/02/20 0747 11/03/20 0312  WBC 12.6*  --  16.9* 11.8*  RBC 3.72*  --  3.62* 3.43*  HGB 12.0 12.6 11.8* 11.3*  HCT 37.2 37.0 36.3 33.3*  MCV 100.0  --  100.3* 97.1  MCH 32.3  --  32.6 32.9  MCHC 32.3  --  32.5 33.9  RDW 13.0  --  13.0 13.0  PLT 135*  --  160 161    BNP Recent Labs  Lab 11/01/20 1827  BNP 672.0*     DDimer No results for input(s): DDIMER in the last 168 hours.   Radiology    DG Chest 2 View  Result Date: 11/01/2020 CLINICAL DATA:  Chest pain EXAM: CHEST - 2 VIEW COMPARISON:  12/07/2019 and prior. FINDINGS: Left predominant patchy bibasilar opacities. No pneumothorax or pleural effusion. Cardiomegaly and central pulmonary vascular congestion, unchanged. Multilevel spondylosis. Osteopenia. IMPRESSION: Cardiomegaly and central  pulmonary vascular congestion. Patchy bibasilar opacities. Differential includes atelectasis, infection or edema. Electronically Signed   By: Primitivo Gauze M.D.   On: 11/01/2020 17:37   ECHOCARDIOGRAM COMPLETE  Result Date: 11/02/2020    ECHOCARDIOGRAM REPORT   Patient Name:   [name] Date of Exam: 11/02/2020 Medical Rec #:  [id number]      Height:       71.0 in Accession #:    [id number]     Weight:       170.0 lb Date of Birth:  [birth year]      BSA:          1.968 m Patient Age:    57 years       BP:           100/71 mmHg Patient Gender: F              HR:           106 bpm. Exam Location:  Inpatient Procedure: 2D Echo, Color Doppler and Cardiac Doppler Indications:    Pericardial effusion 423.9 / I31.3  History:        Patient has prior history of Echocardiogram examinations, most                 recent 03/30/2020. CHF; Mitral Valve Disease and Aortic Valve                 Disease. Chronic atrial fibrillation. COVID-19. PFO. Anxiety.                 Chest pain. Peripheral vascular disease. Chronic kidney disease                 stage III,.  Sonographer:    Darlina Sicilian RDCS Referring Phys: [place]  1. Left ventricular ejection fraction, by estimation, is 55 to 60%. The left ventricle has normal function. The left ventricle has no regional wall motion abnormalities. There is moderate left ventricular hypertrophy. Left ventricular diastolic parameters are indeterminate. There is the interventricular septum is flattened in systole and diastole, consistent with right ventricular pressure and volume overload.  2. Right ventricular systolic function is moderately reduced. The right ventricular  size is moderately enlarged. There is moderately elevated pulmonary artery systolic pressure. The estimated right ventricular systolic pressure is 20.2 mmHg.  3. Left atrial size was severely dilated.  4. Right atrial size was mildly dilated.  5. The mitral valve is degenerative. Mild  to moderate mitral valve regurgitation. Severe mitral stenosis. Severe mitral annular calcification. MG 19 mmHg at HR 117 bpm, MVA 0.7 cm^2 by continuity equation  6. Tricuspid valve regurgitation is moderate to severe.  7. The aortic valve is tricuspid. There is severe calcifcation of the aortic valve. Aortic valve regurgitation is trivial. Mild to moderate aortic valve stenosis. Mild AS by gradients (Vmax 2.5 m/s, MG 14 mmHG), likely due to low SV (SVi 21 cc/m^2). AS is moderate by AVA (1.1 cm^2) and DI (0.31). Suspect moderate AS.  8. The inferior vena cava is dilated in size with <50% respiratory variability, suggesting right atrial pressure of 15 mmHg.  9. Moderate pericardial effusion measuring 1.5cm adjacent to LV posterior wall. No evidence of tamponade. IVC is fixed/dilated, but no RA/RV collapse or significant mitral inflow respiratory variation seen. FINDINGS  Left Ventricle: Left ventricular ejection fraction, by estimation, is 55 to 60%. The left ventricle has normal function. The left ventricle has no regional wall motion abnormalities. The left ventricular internal cavity size was small. There is moderate  left ventricular hypertrophy. The interventricular septum is flattened in systole and diastole, consistent with right ventricular pressure and volume overload. Left ventricular diastolic parameters are indeterminate. Right Ventricle: The right ventricular size is moderately enlarged. Right vetricular wall thickness was not assessed. Right ventricular systolic function is moderately reduced. There is moderately elevated pulmonary artery systolic pressure. The tricuspid regurgitant velocity is 3.34 m/s, and with an assumed right atrial pressure of 15 mmHg, the estimated right ventricular systolic pressure is 54.2 mmHg. Left Atrium: Left atrial size was severely dilated. Right Atrium: Right atrial size was mildly dilated. Pericardium: Moderate pericardial effusion measuring 1.2cm adjacent to LV  posterior wall. No evidence of tamponade. IVC is fixed/dilated, but no RA/RV collapse or significant mitral inflow respiratory variation seen. A moderately sized pericardial effusion is present. Mitral Valve: The mitral valve is degenerative in appearance. Severe mitral annular calcification. Mild to moderate mitral valve regurgitation. Severe mitral valve stenosis. MV peak gradient, 25.3 mmHg. The mean mitral valve gradient is 16.3 mmHg. Tricuspid Valve: The tricuspid valve is normal in structure. Tricuspid valve regurgitation is moderate to severe. Aortic Valve: The aortic valve is tricuspid. There is severe calcifcation of the aortic valve. Aortic valve regurgitation is trivial. Mild to moderate aortic stenosis is present. Aortic valve mean gradient measures 11.7 mmHg. Aortic valve peak gradient measures 22.3 mmHg. Aortic valve area, by VTI measures 1.19 cm. Pulmonic Valve: The pulmonic valve was not well visualized. Pulmonic valve regurgitation is trivial. Aorta: The aortic root is normal in size and structure. Venous: The inferior vena cava is dilated in size with less than 50% respiratory variability, suggesting right atrial pressure of 15 mmHg. IAS/Shunts: The interatrial septum was not well visualized.  LEFT VENTRICLE PLAX 2D LVIDd:         3.70 cm LVIDs:         2.40 cm LV PW:         1.20 cm LV IVS:        1.20 cm LVOT diam:     2.10 cm LV SV:         41 LV SV Index:   21 LVOT Area:  3.46 cm  RIGHT VENTRICLE TAPSE (M-mode): 1.0 cm LEFT ATRIUM              Index        RIGHT ATRIUM           Index LA diam:        7.90 cm  4.01 cm/m   RA Area:     25.40 cm LA Vol (A2C):   239.0 ml 121.45 ml/m RA Volume:   77.40 ml  39.33 ml/m LA Vol (A4C):   246.0 ml 125.01 ml/m LA Biplane Vol: 243.0 ml 123.49 ml/m  AORTIC VALVE AV Area (Vmax):    1.25 cm AV Area (Vmean):   1.26 cm AV Area (VTI):     1.19 cm AV Vmax:           236.00 cm/s AV Vmean:          156.000 cm/s AV VTI:            0.348 m AV Peak Grad:       22.3 mmHg AV Mean Grad:      11.7 mmHg LVOT Vmax:         84.85 cm/s LVOT Vmean:        56.750 cm/s LVOT VTI:          0.120 m LVOT/AV VTI ratio: 0.34  AORTA Ao Root diam: 3.40 cm MITRAL VALVE                 TRICUSPID VALVE MV Area (PHT): 1.51 cm      TR Peak grad:   44.6 mmHg MV Peak grad:  25.3 mmHg     TR Vmax:        334.00 cm/s MV Mean grad:  16.3 mmHg MV Vmax:       2.51 m/s      SHUNTS MV Vmean:      194.0 cm/s    Systemic VTI:  0.12 m MV Decel Time: 504 msec      Systemic Diam: 2.10 cm MR Peak grad:    79.6 mmHg MR Mean grad:    53.0 mmHg MR Vmax:         446.00 cm/s MR Vmean:        350.0 cm/s MR PISA:         1.90 cm MR PISA Eff ROA: 15 mm MR PISA Radius:  0.55 cm MV E velocity: 255.00 cm/s Oswaldo Milian MD Electronically signed by Oswaldo Milian MD Signature Date/Time: 11/02/2020/2:28:29 PM    Final    CT Angio Chest/Abd/Pel for Dissection W and/or Wo Contrast  Result Date: 11/01/2020 CLINICAL DATA:  Abdominal pain.  Chest pain.  Shortness of breath. EXAM: CT ANGIOGRAPHY CHEST, ABDOMEN AND PELVIS TECHNIQUE: Non-contrast CT of the chest was initially obtained. Multidetector CT imaging through the chest, abdomen and pelvis was performed using the standard protocol during bolus administration of intravenous contrast. Multiplanar reconstructed images and MIPs were obtained and reviewed to evaluate the vascular anatomy. CONTRAST:  158mL OMNIPAQUE IOHEXOL 350 MG/ML SOLN COMPARISON:  February 17, 2016 FINDINGS: CTA CHEST FINDINGS Cardiovascular: There is significant cardiomegaly with severe biatrial enlargement. There are atherosclerotic changes of the thoracic aorta without evidence for an aneurysm or dissection. There are coronary artery calcifications. There is a moderate-sized pericardial effusion. There is no evidence for a large centrally located pulmonary embolism. There is reflux of contrast into the IVC. Mediastinum/Nodes: -- No mediastinal lymphadenopathy. -- No hilar  lymphadenopathy. -- No axillary lymphadenopathy. --  No supraclavicular lymphadenopathy. -- Normal thyroid gland where visualized. -  Unremarkable esophagus. Lungs/Pleura: There are trace to small bilateral pleural effusions with adjacent atelectasis, left worse than right. There is a mosaic appearance of the lung parenchyma bilaterally. There is a ground-glass airspace opacity in the left upper lobe measuring approximately 6 mm (axial series 11, image 35). There is bilateral interlobular septal thickening the trachea is unremarkable. Musculoskeletal: No chest wall abnormality. No bony spinal canal stenosis. Review of the MIP images confirms the above findings. CTA ABDOMEN AND PELVIS FINDINGS VASCULAR Aorta: There are atherosclerotic changes of the abdominal aorta without evidence for an aneurysm or dissection. Celiac: There is moderate narrowing at the origin of the celiac axis. SMA: Patent without evidence of aneurysm, dissection, vasculitis or significant stenosis. Renals: Both renal arteries are patent without evidence of aneurysm, dissection, vasculitis, fibromuscular dysplasia or significant stenosis. There are 2 right renal arteries. IMA: Patent without evidence of aneurysm, dissection, vasculitis or significant stenosis. Inflow: Patent without evidence of aneurysm, dissection, vasculitis or significant stenosis. Veins: No obvious venous abnormality within the limitations of this arterial phase study. Review of the MIP images confirms the above findings. NON-VASCULAR Hepatobiliary: The liver is enlarged and somewhat nodular in appearance. There is suggestion of hepatic venous congestion which may be secondary to right-sided heart failure. Normal gallbladder.There is no biliary ductal dilation. Pancreas: Normal contours without ductal dilatation. No peripancreatic fluid collection. Spleen: The spleen is enlarged measuring 13 cm craniocaudad. Adrenals/Urinary Tract: --Adrenal glands: Unremarkable. --Right  kidney/ureter: No hydronephrosis or radiopaque kidney stones. --Left kidney/ureter: No hydronephrosis or radiopaque kidney stones. --Urinary bladder: Unremarkable. Stomach/Bowel: --Stomach/Duodenum: No hiatal hernia or other gastric abnormality. Normal duodenal course and caliber. --Small bowel: Unremarkable. --Colon: Rectosigmoid diverticulosis without acute inflammation. The patient appears to be status post prior right hemicolectomy. --Appendix: Surgically absent. Lymphatic: --No retroperitoneal lymphadenopathy. --No mesenteric lymphadenopathy. --No pelvic or inguinal lymphadenopathy. Reproductive: Status post hysterectomy. No adnexal mass. Other: There is a small amount of free fluid in the abdomen and pelvis. The abdominal wall is normal. Musculoskeletal. No acute displaced fractures. Review of the MIP images confirms the above findings. IMPRESSION: 1. No evidence for aortic dissection or aneurysm. 2. Cardiomegaly with severe biatrial enlargement. There is a moderate-sized pericardial effusion. 3. Findings of congestive heart failure with developing pulmonary edema. 4. Ground-glass airspace opacity in the left upper lobe measuring approximately 6 mm. This may be secondary to underlying pulmonary edema. Follow-up recommendations are as follows. Initial follow-up with CT at 6-12 months is recommended to confirm persistence. If persistent, repeat CT is recommended every 2 years until 5 years of stability has been established. This recommendation follows the consensus statement: Guidelines for Management of Incidental Pulmonary Nodules Detected on CT Images: From the Fleischner Society 2017; Radiology 2017; 284:228-243. Aortic Atherosclerosis (ICD10-I70.0). Electronically Signed   By: Constance Holster M.D.   On: 11/01/2020 19:52    Cardiac Studies   Echo 11/03/20 IMPRESSIONS    1. Left ventricular ejection fraction, by estimation, is 55 to 60%. The  left ventricle has normal function. The left ventricle  has no regional  wall motion abnormalities. There is moderate left ventricular hypertrophy.  Left ventricular diastolic  parameters are indeterminate. There is the interventricular septum is  flattened in systole and diastole, consistent with right ventricular  pressure and volume overload.  2. Right ventricular systolic function is moderately reduced. The right  ventricular size is moderately enlarged. There is moderately elevated  pulmonary artery systolic pressure. The estimated right ventricular  systolic pressure is 74.1 mmHg.  3. Left atrial size was severely dilated.  4. Right atrial size was mildly dilated.  5. The mitral valve is degenerative. Mild to moderate mitral valve  regurgitation. Severe mitral stenosis. Severe mitral annular  calcification. MG 19 mmHg at HR 117 bpm, MVA 0.7 cm^2 by continuity  equation  6. Tricuspid valve regurgitation is moderate to severe.  7. The aortic valve is tricuspid. There is severe calcifcation of the  aortic valve. Aortic valve regurgitation is trivial. Mild to moderate  aortic valve stenosis. Mild AS by gradients (Vmax 2.5 m/s, MG 14 mmHG),  likely due to low SV (SVi 21 cc/m^2). AS  is moderate by AVA (1.1 cm^2) and DI (0.31). Suspect moderate AS.  8. The inferior vena cava is dilated in size with <50% respiratory  variability, suggesting right atrial pressure of 15 mmHg.  9. Moderate pericardial effusion measuring 1.5cm adjacent to LV posterior  wall. No evidence of tamponade. IVC is fixed/dilated, but no RA/RV  collapse or significant mitral inflow respiratory variation seen.   FINDINGS  Left Ventricle: Left ventricular ejection fraction, by estimation, is 55  to 60%. The left ventricle has normal function. The left ventricle has no  regional wall motion abnormalities. The left ventricular internal cavity  size was small. There is moderate  left ventricular hypertrophy. The interventricular septum is flattened in  systole  and diastole, consistent with right ventricular pressure and  volume overload. Left ventricular diastolic parameters are indeterminate.   Right Ventricle: The right ventricular size is moderately enlarged. Right  vetricular wall thickness was not assessed. Right ventricular systolic  function is moderately reduced. There is moderately elevated pulmonary  artery systolic pressure. The  tricuspid regurgitant velocity is 3.34 m/s, and with an assumed right  atrial pressure of 15 mmHg, the estimated right ventricular systolic  pressure is 42.3 mmHg.   Left Atrium: Left atrial size was severely dilated.   Right Atrium: Right atrial size was mildly dilated.   Pericardium: Moderate pericardial effusion measuring 1.2cm adjacent to LV  posterior wall. No evidence of tamponade. IVC is fixed/dilated, but no  RA/RV collapse or significant mitral inflow respiratory variation seen. A  moderately sized pericardial  effusion is present.   Mitral Valve: The mitral valve is degenerative in appearance. Severe  mitral annular calcification. Mild to moderate mitral valve regurgitation.  Severe mitral valve stenosis. MV peak gradient, 25.3 mmHg. The mean mitral  valve gradient is 16.3 mmHg.   Tricuspid Valve: The tricuspid valve is normal in structure. Tricuspid  valve regurgitation is moderate to severe.   Aortic Valve: The aortic valve is tricuspid. There is severe calcifcation  of the aortic valve. Aortic valve regurgitation is trivial. Mild to  moderate aortic stenosis is present. Aortic valve mean gradient measures  11.7 mmHg. Aortic valve peak gradient  measures 22.3 mmHg. Aortic valve area, by VTI measures 1.19 cm.   Pulmonic Valve: The pulmonic valve was not well visualized. Pulmonic valve  regurgitation is trivial.   Aorta: The aortic root is normal in size and structure.   Venous: The inferior vena cava is dilated in size with less than 50%  respiratory variability, suggesting right  atrial pressure of 15 mmHg.   Patient Profile     86 y.o. female history of atrial fibrillation, moderate aortic stenosis, severe mitral stenosis, moderate mitral regurgitation.    Assessment & Plan    1.  Severe pleuritic chest pain: The patient has been given some Dilaudid which  is helped with her severe pleuritic chest pain.  CT scan was negative for dissection.  I will try some colchicine.  If this does not work out have a low threshold to try some steroids.  Troponin neg  She had a cath showing normal coronary arteries a year ago. And no ischemia on myoview 03/30/20  --moderate pericardial effusion measuring 1.5cm adjacent to LV posterior wall. No evidence of tamponade. EF 55-60% moderate mild LVH  Mod to severe TR mild to mod AS  --colchicine started now 0.6 mg daily - chest pain controlled but present    2.  Severe mitral stenosis: Valve is very calcified. She is not a Candidate for blood valvuloplasty -DNR  3.  Acute CHF due to MS; BNP 672   I&O neg 38  No wt changes - lasix held with elevated Cr but with rales will defer to Dr. Acie Fredrickson  4.  Atrial fib-chronic  with INR of 2.8 on admit and today 3.2   5.  Hyponatremia with Na at 126 down from 128 on admit and hyperkalemia with K+ 5.5 per IM agree with hold spironolactone from home   6. AKI with Cr now 1.63 up from 1.10  Per IM     .    For questions or updates, please contact [place] Please consult [email or web address] for contact info under        Signed, Cecilie Kicks, NP  11/03/2020, 9:46 AM     Attending Note:   The patient was seen and examined.  Agree with assessment and plan as noted above.  Changes made to the above note as needed.  Patient seen and independently examined with Cecilie Kicks, NP .   We discussed all aspects of the encounter. I agree with the assessment and plan as stated above.  1.   Pericarditis:   She seems to be getting better on the colchicine .  Cont colchicine 0.6 mg BID.  Cont other  pains meds as needed  She can be discharged once her pain is under control with oral meds.   I have spent a total of 40 minutes with patient reviewing hospital  notes , telemetry, EKGs, labs and examining patient as well as establishing an assessment and plan that was discussed with the patient. > 50% of time was spent in direct patient care.    Thayer Headings, Brooke Bonito., MD, [facility] 11/03/2020, 11:20 AM [address],  [place] Pager [phone]

## 2020-11-03 NOTE — Discharge Instructions (Signed)

## 2020-11-03 NOTE — Progress Notes (Signed)
   11/02/20 2201  Assess: MEWS Score  BP 99/69  Pulse Rate (!) 109  Resp 20  Level of Consciousness Alert  SpO2 93 %  O2 Device Nasal Cannula  O2 Flow Rate (L/min) 2 L/min  Assess: MEWS Score  MEWS Temp 0  MEWS Systolic 1  MEWS Pulse 1  MEWS RR 0  MEWS LOC 0  MEWS Score 2  MEWS Score Color Yellow  Assess: if the MEWS score is Yellow or Red  Were vital signs taken at a resting state? Yes  Focused Assessment No change from prior assessment  Early Detection of Sepsis Score *See Row Information* Low  MEWS guidelines implemented *See Row Information* Yes  Treat  MEWS Interventions Administered scheduled meds/treatments;Administered prn meds/treatments  Pain Scale 0-10  Pain Score 10  Pain Intervention(s) Medication (See eMAR);MD notified (Comment)  Take Vital Signs  Increase Vital Sign Frequency  Yellow: Q 2hr X 2 then Q 4hr X 2, if remains yellow, continue Q 4hrs  Escalate  MEWS: Escalate Yellow: discuss with charge nurse/RN and consider discussing with provider and RRT  Notify: Charge Nurse/RN  Name of Charge Nurse/RN Notified [place], RN  Date Charge Nurse/RN Notified 11/02/20  Time Charge Nurse/RN Notified 2215  Notify: Provider  Provider Name/Title Vickki Muff, MD  Date Provider Notified 11/02/20  Time Provider Notified 2205  Notification Type Page  Notification Reason Change in status  Response Other (Comment) (verbal order to give prn xanax again)  Date of Provider Response 11/02/20  Time of Provider Response 2206  Document  Patient Outcome Other (Comment) (monitoring VS)  Progress note created (see row info) Yes   Patient yellow MEWS due to HR and SBP.  Patient has been yellow this admission but for the first time on this unit.  Patient currently having uncontrolled pain.  MD notified. Yellow MEWS protocol initiated.

## 2020-11-03 NOTE — Progress Notes (Signed)
PROGRESS NOTE    CHANLEY MCENERY  LFY:[id number] DOB: 1929-05-22 DOA: 11/01/2020 PCP: Laurey Morale, MD   Brief Narrative:  HPI on 11/01/2020 by Dr. Gean Birchwood [name] NEEDLE is a 86 y.o. female with history of A. fib, mitral stenosis, chronic kidney disease stage III, chronic hyponatremia, history of colon cancer, hypothyroidism, anemia presents to the ER because of chest pain.  Patient has been having chest pain for the last 1 week mostly in the center of the chest radiating to the left arm.  Patient did have some dysuria over the last few days and had gone to the urgent care center on Saturday about 4 days ago and was given antibiotics which patient has been taking for last 3 days.  Her dysuria has improved.  But due to worsening chest pain which is pleuritic in nature radiating to her left shoulder patient presents to the ER.  Denies any nausea vomiting diarrhea has some shortness of breath.  Felt weak.  Patient received her COVID-19 booster shot vaccination a month ago exactly.  Interim history Admitted with chest pain likely secondary to pericardial effusion/pericarditis.  Started on colchicine by cardiology.  Assessment & Plan   Chest pain likely secondary to moderate pericardial effusion/pericarditis -CT chest no evidence of aortic dissection or aneurysm.  Cardiomegaly with severe biatrial enlargement, moderate sized pericardial effusion. -Cardiology consulted and appreciated -Currently no signs of tamponade -Echocardiogram showed EF of 55 to 60%.  No regional wall motion abnormalities.  Moderate left LVH.  LV diastolic parameters indeterminate.  RV systolic function mildly reduced.  Mitral valve degenerative, mild to moderate MV regurgitation, severe mitral stenosis.  Moderate to severe tricuspid valve regurgitation.  Severe calcification of aortic valve with trivial regurgitation and mild to moderate aortic valve stenosis. -Cardiology added colchicine, will renally adjust  dose -Continue pain control with IV Dilaudid.  Will add on oral hydrocodone.  Also continue Xanax for anxiety  Severe mitral stenosis/elevated BNP -CT scan shows findings suggestive of heart failure developing pulmonary edema -BNP 672 -Last echocardiogram Mar 30, 2020 shows an EF of 60 to 65%.  LV diastolic function could not be evaluated. -Patient was given 1 dose of IV Lasix -Blood pressures have been soft -Cardiology consulted and appreciated -Echocardiogram as above  Chronic kidney disease, stage IIIa with hyperkalemia -As above, patient did receive 1 dose of IV Lasix in the emergency department -Potassium remains elevated at 5. 5 today-will give dose of Lokelma -Continue to monitor BMP  Chronic hyponatremia -Sodium currently 126 (baseline approximately 130-134) -Continue to monitor BMP  Chronic anemia -Hemoglobin stable -Continue to monitor CBC  Atrial fibrillation -Continue Coumadin per pharmacy -Currently patient is tachycardic in the low 100s (106)-pain may also be contributing to this -INR 3.2  Essential hypertension -BP has been soft -Antihypertensive medications have been held  UTI -Patient was recently treated for urinary tract infection however currently is asymptomatic -Patient was treated with Keflex as an outpatient  Hypothyroidism -Continue Synthroid  Thrombocytopenia -resolved, continue to monitor CBC  Lung opacity -Noted on CT chest, groundglass airspace opacity left upper lobe measuring 6 mm -May be secondary to pulmonary edema. -Follow-up recommendations for repeat CT in 6 to 12 months  Mildly elevated Lactic acid -likely secondary to the above -no infectious etiology    DVT Prophylaxis Coumadin  Code Status: DNR  Family Communication: Niece at bedside  Disposition Plan:  Status is: Inpatient  Remains inpatient appropriate because:Ongoing active pain requiring inpatient pain management, Ongoing diagnostic testing needed  not  appropriate for outpatient work up, IV treatments appropriate due to intensity of illness or inability to take PO and Inpatient level of care appropriate due to severity of illness   Dispo: The patient is from: Home              Anticipated d/c is to: Home              Anticipated d/c date is: 3 days              Patient currently is not medically stable to d/c.  Consultants Cardiology  Procedures  None  Antibiotics   Anti-infectives (From admission, onward)   Start     Dose/Rate Route Frequency Ordered Stop   11/02/20 0500  cefTRIAXone (ROCEPHIN) 1 g in sodium chloride 0.9 % 100 mL IVPB        1 g 200 mL/hr over 30 Minutes Intravenous Daily 11/02/20 0446        Subjective:   Jolicia Delira seen and examined today.  Patient feels pain is better controlled today and she is able to rest.  Denies current chest pain or shortness of breath, abdominal pain, vomiting, diarrhea or constipation, dizziness or headache.  Has had some nausea.    Objective:   Vitals:   11/03/20 0300 11/03/20 0551 11/03/20 0824 11/03/20 0835  BP:  105/76 98/70   Pulse:  100 93   Resp:  14  20  Temp:  97.7 F (36.5 C) 97.7 F (36.5 C)   TempSrc:  Oral Oral   SpO2:  93% 100%   Weight: 77.5 kg     Height:        Intake/Output Summary (Last 24 hours) at 11/03/2020 0925 Last data filed at 11/03/2020 6389 Gross per 24 hour  Intake 762 ml  Output 700 ml  Net 62 ml   Filed Weights   11/02/20 0706 11/03/20 0300  Weight: 77.1 kg 77.5 kg   Exam  General: Well developed, elderly, NAD  HEENT: NCAT, mucous membranes moist.   Cardiovascular: S1 S2 auscultated, irregular, 2/6 SEM  Respiratory: Clear to auscultation bilaterally, no wheezing  Abdomen: Soft, nontender, nondistended, + bowel sounds  Extremities: warm dry without cyanosis clubbing.  Chronic stasis dermatitis, trace LE edema  Neuro: AAOx3, nonfocal  Psych: appropriate mood and affect, pleasant  Data Reviewed: I have personally  reviewed following labs and imaging studies  CBC: Recent Labs  Lab 11/01/20 1649 11/01/20 2018 11/02/20 0747 11/03/20 0312  WBC 12.6*  --  16.9* 11.8*  NEUTROABS  --   --  13.6*  --   HGB 12.0 12.6 11.8* 11.3*  HCT 37.2 37.0 36.3 33.3*  MCV 100.0  --  100.3* 97.1  PLT 135*  --  160 373   Basic Metabolic Panel: Recent Labs  Lab 11/01/20 1649 11/01/20 2018 11/02/20 0747 11/03/20 0312  NA 125* 126* 128* 126*  K 5.7* 5.5* 5.2* 5.5*  CL 95* 95* 95* 94*  CO2 20*  --  19* 22  GLUCOSE 128* 136* 117* 125*  BUN 34* 33* 31* 47*  CREATININE 1.04* 1.00 1.10* 1.63*  CALCIUM 9.2  --  9.3 9.3   GFR: Estimated Creatinine Clearance: 26.2 mL/min (A) (by C-G formula based on SCr of 1.63 mg/dL (H)). Liver Function Tests: Recent Labs  Lab 11/01/20 1849 11/02/20 0747  AST 22 23  ALT 16 16  ALKPHOS 94 87  BILITOT 3.2* 2.9*  PROT 6.9 6.7  ALBUMIN 3.6 3.5   Recent Labs  Lab 11/01/20 1849  LIPASE 25   No results for input(s): AMMONIA in the last 168 hours. Coagulation Profile: Recent Labs  Lab 11/01/20 1827 11/03/20 0312  INR 2.8* 3.2*   Cardiac Enzymes: No results for input(s): CKTOTAL, CKMB, CKMBINDEX, TROPONINI in the last 168 hours. BNP (last 3 results) No results for input(s): PROBNP in the last 8760 hours. HbA1C: No results for input(s): HGBA1C in the last 72 hours. CBG: No results for input(s): GLUCAP in the last 168 hours. Lipid Profile: No results for input(s): CHOL, HDL, LDLCALC, TRIG, CHOLHDL, LDLDIRECT in the last 72 hours. Thyroid Function Tests: Recent Labs    11/02/20 0041  TSH 0.974   Anemia Panel: No results for input(s): VITAMINB12, FOLATE, FERRITIN, TIBC, IRON, RETICCTPCT in the last 72 hours. Urine analysis:    Component Value Date/Time   COLORURINE YELLOW 11/02/2020 0542   APPEARANCEUR CLEAR 11/02/2020 0542   LABSPEC 1.034 (H) 11/02/2020 0542   PHURINE 5.0 11/02/2020 0542   GLUCOSEU NEGATIVE 11/02/2020 0542   GLUCOSEU NEGATIVE 07/15/2008  1258   HGBUR SMALL (A) 11/02/2020 0542   BILIRUBINUR NEGATIVE 11/02/2020 0542   BILIRUBINUR negative 05/25/2020 1430   KETONESUR NEGATIVE 11/02/2020 0542   PROTEINUR NEGATIVE 11/02/2020 0542   UROBILINOGEN 0.2 05/25/2020 1430   UROBILINOGEN 0.2 09/12/2014 1810   NITRITE NEGATIVE 11/02/2020 0542   LEUKOCYTESUR NEGATIVE 11/02/2020 0542   Sepsis Labs: @LABRCNTIP (procalcitonin:4,lacticidven:4)  ) Recent Results (from the past 240 hour(s))  Resp Panel by RT-PCR (Flu A&B, Covid) Nasopharyngeal Swab     Status: None   Collection Time: 11/01/20  7:58 PM   Specimen: Nasopharyngeal Swab; Nasopharyngeal(NP) swabs in vial transport medium  Result Value Ref Range Status   SARS Coronavirus 2 by RT PCR NEGATIVE NEGATIVE Final    Comment: (NOTE) SARS-CoV-2 target nucleic acids are NOT DETECTED.  The SARS-CoV-2 RNA is generally detectable in upper respiratory specimens during the acute phase of infection. The lowest concentration of SARS-CoV-2 viral copies this assay can detect is 138 copies/mL. A negative result does not preclude SARS-Cov-2 infection and should not be used as the sole basis for treatment or other patient management decisions. A negative result may occur with  improper specimen collection/handling, submission of specimen other than nasopharyngeal swab, presence of viral mutation(s) within the areas targeted by this assay, and inadequate number of viral copies(<138 copies/mL). A negative result must be combined with clinical observations, patient history, and epidemiological information. The expected result is Negative.  Fact Sheet for Patients:  EntrepreneurPulse.com.au  Fact Sheet for Healthcare Providers:  IncredibleEmployment.be  This test is no t yet approved or cleared by the [place] FDA and  has been authorized for detection and/or diagnosis of SARS-CoV-2 by FDA under an Emergency Use Authorization (EUA). This EUA will remain   in effect (meaning this test can be used) for the duration of the COVID-19 declaration under Section 564(b)(1) of the Act, 21 U.S.C.section 360bbb-3(b)(1), unless the authorization is terminated  or revoked sooner.       Influenza A by PCR NEGATIVE NEGATIVE Final   Influenza B by PCR NEGATIVE NEGATIVE Final    Comment: (NOTE) The Xpert Xpress SARS-CoV-2/FLU/RSV plus assay is intended as an aid in the diagnosis of influenza from Nasopharyngeal swab specimens and should not be used as a sole basis for treatment. Nasal washings and aspirates are unacceptable for Xpert Xpress SARS-CoV-2/FLU/RSV testing.  Fact Sheet for Patients: EntrepreneurPulse.com.au  Fact Sheet for Healthcare Providers: IncredibleEmployment.be  This test is not yet approved or  cleared by the [place] and has been authorized for detection and/or diagnosis of SARS-CoV-2 by FDA under an Emergency Use Authorization (EUA). This EUA will remain in effect (meaning this test can be used) for the duration of the COVID-19 declaration under Section 564(b)(1) of the Act, 21 U.S.C. section 360bbb-3(b)(1), unless the authorization is terminated or revoked.  Performed at [facility], [place] [address] [place], [place] [address]   Urine Culture     Status: Abnormal (Preliminary result)   Collection Time: 11/02/20  6:10 AM   Specimen: Urine, Random  Result Value Ref Range Status   Specimen Description URINE, RANDOM  Final   Special Requests NONE  Final   Culture (A)  Final    10,000 COLONIES/mL GRAM NEGATIVE RODS IDENTIFICATION AND SUSCEPTIBILITIES TO FOLLOW Performed at [facility], [place] [address], [place], [place] [address]    Report Status PENDING  Incomplete      Radiology Studies: DG Chest 2 View  Result Date: 11/01/2020 CLINICAL DATA:  Chest pain EXAM: CHEST - 2 VIEW COMPARISON:  12/07/2019 and prior. FINDINGS: Left predominant patchy bibasilar  opacities. No pneumothorax or pleural effusion. Cardiomegaly and central pulmonary vascular congestion, unchanged. Multilevel spondylosis. Osteopenia. IMPRESSION: Cardiomegaly and central pulmonary vascular congestion. Patchy bibasilar opacities. Differential includes atelectasis, infection or edema. Electronically Signed   By: Primitivo Gauze M.D.   On: 11/01/2020 17:37   ECHOCARDIOGRAM COMPLETE  Result Date: 11/02/2020    ECHOCARDIOGRAM REPORT   Patient Name:   LELLA MULLANY Date of Exam: 11/02/2020 Medical Rec #:  [id number]      Height:       71.0 in Accession #:    [id number]     Weight:       170.0 lb Date of Birth:  1929-05-05      BSA:          1.968 m Patient Age:    3 years       BP:           100/71 mmHg Patient Gender: F              HR:           106 bpm. Exam Location:  Inpatient Procedure: 2D Echo, Color Doppler and Cardiac Doppler Indications:    Pericardial effusion 423.9 / I31.3  History:        Patient has prior history of Echocardiogram examinations, most                 recent 03/30/2020. CHF; Mitral Valve Disease and Aortic Valve                 Disease. Chronic atrial fibrillation. COVID-19. PFO. Anxiety.                 Chest pain. Peripheral vascular disease. Chronic kidney disease                 stage III,.  Sonographer:    Darlina Sicilian RDCS Referring Phys: [place]  1. Left ventricular ejection fraction, by estimation, is 55 to 60%. The left ventricle has normal function. The left ventricle has no regional wall motion abnormalities. There is moderate left ventricular hypertrophy. Left ventricular diastolic parameters are indeterminate. There is the interventricular septum is flattened in systole and diastole, consistent with right ventricular pressure and volume overload.  2. Right ventricular systolic function is moderately reduced. The right ventricular size is moderately enlarged.  There is moderately elevated pulmonary artery systolic pressure. The  estimated right ventricular systolic pressure is 21.1 mmHg.  3. Left atrial size was severely dilated.  4. Right atrial size was mildly dilated.  5. The mitral valve is degenerative. Mild to moderate mitral valve regurgitation. Severe mitral stenosis. Severe mitral annular calcification. MG 19 mmHg at HR 117 bpm, MVA 0.7 cm^2 by continuity equation  6. Tricuspid valve regurgitation is moderate to severe.  7. The aortic valve is tricuspid. There is severe calcifcation of the aortic valve. Aortic valve regurgitation is trivial. Mild to moderate aortic valve stenosis. Mild AS by gradients (Vmax 2.5 m/s, MG 14 mmHG), likely due to low SV (SVi 21 cc/m^2). AS is moderate by AVA (1.1 cm^2) and DI (0.31). Suspect moderate AS.  8. The inferior vena cava is dilated in size with <50% respiratory variability, suggesting right atrial pressure of 15 mmHg.  9. Moderate pericardial effusion measuring 1.5cm adjacent to LV posterior wall. No evidence of tamponade. IVC is fixed/dilated, but no RA/RV collapse or significant mitral inflow respiratory variation seen. FINDINGS  Left Ventricle: Left ventricular ejection fraction, by estimation, is 55 to 60%. The left ventricle has normal function. The left ventricle has no regional wall motion abnormalities. The left ventricular internal cavity size was small. There is moderate  left ventricular hypertrophy. The interventricular septum is flattened in systole and diastole, consistent with right ventricular pressure and volume overload. Left ventricular diastolic parameters are indeterminate. Right Ventricle: The right ventricular size is moderately enlarged. Right vetricular wall thickness was not assessed. Right ventricular systolic function is moderately reduced. There is moderately elevated pulmonary artery systolic pressure. The tricuspid regurgitant velocity is 3.34 m/s, and with an assumed right atrial pressure of 15 mmHg, the estimated right ventricular systolic pressure is 94.1  mmHg. Left Atrium: Left atrial size was severely dilated. Right Atrium: Right atrial size was mildly dilated. Pericardium: Moderate pericardial effusion measuring 1.2cm adjacent to LV posterior wall. No evidence of tamponade. IVC is fixed/dilated, but no RA/RV collapse or significant mitral inflow respiratory variation seen. A moderately sized pericardial effusion is present. Mitral Valve: The mitral valve is degenerative in appearance. Severe mitral annular calcification. Mild to moderate mitral valve regurgitation. Severe mitral valve stenosis. MV peak gradient, 25.3 mmHg. The mean mitral valve gradient is 16.3 mmHg. Tricuspid Valve: The tricuspid valve is normal in structure. Tricuspid valve regurgitation is moderate to severe. Aortic Valve: The aortic valve is tricuspid. There is severe calcifcation of the aortic valve. Aortic valve regurgitation is trivial. Mild to moderate aortic stenosis is present. Aortic valve mean gradient measures 11.7 mmHg. Aortic valve peak gradient measures 22.3 mmHg. Aortic valve area, by VTI measures 1.19 cm. Pulmonic Valve: The pulmonic valve was not well visualized. Pulmonic valve regurgitation is trivial. Aorta: The aortic root is normal in size and structure. Venous: The inferior vena cava is dilated in size with less than 50% respiratory variability, suggesting right atrial pressure of 15 mmHg. IAS/Shunts: The interatrial septum was not well visualized.  LEFT VENTRICLE PLAX 2D LVIDd:         3.70 cm LVIDs:         2.40 cm LV PW:         1.20 cm LV IVS:        1.20 cm LVOT diam:     2.10 cm LV SV:         41 LV SV Index:   21 LVOT Area:     3.46 cm  RIGHT VENTRICLE TAPSE (M-mode): 1.0 cm LEFT ATRIUM              Index        RIGHT ATRIUM           Index LA diam:        7.90 cm  4.01 cm/m   RA Area:     25.40 cm LA Vol (A2C):   239.0 ml 121.45 ml/m RA Volume:   77.40 ml  39.33 ml/m LA Vol (A4C):   246.0 ml 125.01 ml/m LA Biplane Vol: 243.0 ml 123.49 ml/m  AORTIC VALVE AV  Area (Vmax):    1.25 cm AV Area (Vmean):   1.26 cm AV Area (VTI):     1.19 cm AV Vmax:           236.00 cm/s AV Vmean:          156.000 cm/s AV VTI:            0.348 m AV Peak Grad:      22.3 mmHg AV Mean Grad:      11.7 mmHg LVOT Vmax:         84.85 cm/s LVOT Vmean:        56.750 cm/s LVOT VTI:          0.120 m LVOT/AV VTI ratio: 0.34  AORTA Ao Root diam: 3.40 cm MITRAL VALVE                 TRICUSPID VALVE MV Area (PHT): 1.51 cm      TR Peak grad:   44.6 mmHg MV Peak grad:  25.3 mmHg     TR Vmax:        334.00 cm/s MV Mean grad:  16.3 mmHg MV Vmax:       2.51 m/s      SHUNTS MV Vmean:      194.0 cm/s    Systemic VTI:  0.12 m MV Decel Time: 504 msec      Systemic Diam: 2.10 cm MR Peak grad:    79.6 mmHg MR Mean grad:    53.0 mmHg MR Vmax:         446.00 cm/s MR Vmean:        350.0 cm/s MR PISA:         1.90 cm MR PISA Eff ROA: 15 mm MR PISA Radius:  0.55 cm MV E velocity: 255.00 cm/s Oswaldo Milian MD Electronically signed by Oswaldo Milian MD Signature Date/Time: 11/02/2020/2:28:29 PM    Final    CT Angio Chest/Abd/Pel for Dissection W and/or Wo Contrast  Result Date: 11/01/2020 CLINICAL DATA:  Abdominal pain.  Chest pain.  Shortness of breath. EXAM: CT ANGIOGRAPHY CHEST, ABDOMEN AND PELVIS TECHNIQUE: Non-contrast CT of the chest was initially obtained. Multidetector CT imaging through the chest, abdomen and pelvis was performed using the standard protocol during bolus administration of intravenous contrast. Multiplanar reconstructed images and MIPs were obtained and reviewed to evaluate the vascular anatomy. CONTRAST:  144mL OMNIPAQUE IOHEXOL 350 MG/ML SOLN COMPARISON:  February 17, 2016 FINDINGS: CTA CHEST FINDINGS Cardiovascular: There is significant cardiomegaly with severe biatrial enlargement. There are atherosclerotic changes of the thoracic aorta without evidence for an aneurysm or dissection. There are coronary artery calcifications. There is a moderate-sized pericardial effusion. There  is no evidence for a large centrally located pulmonary embolism. There is reflux of contrast into the IVC. Mediastinum/Nodes: -- No mediastinal lymphadenopathy. -- No hilar lymphadenopathy. -- No axillary lymphadenopathy. -- No supraclavicular  lymphadenopathy. -- Normal thyroid gland where visualized. -  Unremarkable esophagus. Lungs/Pleura: There are trace to small bilateral pleural effusions with adjacent atelectasis, left worse than right. There is a mosaic appearance of the lung parenchyma bilaterally. There is a ground-glass airspace opacity in the left upper lobe measuring approximately 6 mm (axial series 11, image 35). There is bilateral interlobular septal thickening the trachea is unremarkable. Musculoskeletal: No chest wall abnormality. No bony spinal canal stenosis. Review of the MIP images confirms the above findings. CTA ABDOMEN AND PELVIS FINDINGS VASCULAR Aorta: There are atherosclerotic changes of the abdominal aorta without evidence for an aneurysm or dissection. Celiac: There is moderate narrowing at the origin of the celiac axis. SMA: Patent without evidence of aneurysm, dissection, vasculitis or significant stenosis. Renals: Both renal arteries are patent without evidence of aneurysm, dissection, vasculitis, fibromuscular dysplasia or significant stenosis. There are 2 right renal arteries. IMA: Patent without evidence of aneurysm, dissection, vasculitis or significant stenosis. Inflow: Patent without evidence of aneurysm, dissection, vasculitis or significant stenosis. Veins: No obvious venous abnormality within the limitations of this arterial phase study. Review of the MIP images confirms the above findings. NON-VASCULAR Hepatobiliary: The liver is enlarged and somewhat nodular in appearance. There is suggestion of hepatic venous congestion which may be secondary to right-sided heart failure. Normal gallbladder.There is no biliary ductal dilation. Pancreas: Normal contours without ductal  dilatation. No peripancreatic fluid collection. Spleen: The spleen is enlarged measuring 13 cm craniocaudad. Adrenals/Urinary Tract: --Adrenal glands: Unremarkable. --Right kidney/ureter: No hydronephrosis or radiopaque kidney stones. --Left kidney/ureter: No hydronephrosis or radiopaque kidney stones. --Urinary bladder: Unremarkable. Stomach/Bowel: --Stomach/Duodenum: No hiatal hernia or other gastric abnormality. Normal duodenal course and caliber. --Small bowel: Unremarkable. --Colon: Rectosigmoid diverticulosis without acute inflammation. The patient appears to be status post prior right hemicolectomy. --Appendix: Surgically absent. Lymphatic: --No retroperitoneal lymphadenopathy. --No mesenteric lymphadenopathy. --No pelvic or inguinal lymphadenopathy. Reproductive: Status post hysterectomy. No adnexal mass. Other: There is a small amount of free fluid in the abdomen and pelvis. The abdominal wall is normal. Musculoskeletal. No acute displaced fractures. Review of the MIP images confirms the above findings. IMPRESSION: 1. No evidence for aortic dissection or aneurysm. 2. Cardiomegaly with severe biatrial enlargement. There is a moderate-sized pericardial effusion. 3. Findings of congestive heart failure with developing pulmonary edema. 4. Ground-glass airspace opacity in the left upper lobe measuring approximately 6 mm. This may be secondary to underlying pulmonary edema. Follow-up recommendations are as follows. Initial follow-up with CT at 6-12 months is recommended to confirm persistence. If persistent, repeat CT is recommended every 2 years until 5 years of stability has been established. This recommendation follows the consensus statement: Guidelines for Management of Incidental Pulmonary Nodules Detected on CT Images: From the Fleischner Society 2017; Radiology 2017; 284:228-243. Aortic Atherosclerosis (ICD10-I70.0). Electronically Signed   By: Constance Holster M.D.   On: 11/01/2020 19:52      Scheduled Meds: . atorvastatin  20 mg Oral Daily  . colchicine  0.6 mg Oral Daily  . feeding supplement  237 mL Oral BID BM  . ketorolac  1 drop Right Eye BID  . levothyroxine  100 mcg Oral Q0600  . sodium zirconium cyclosilicate  5 g Oral BID  . vitamin B-12  1,000 mcg Oral Daily  . warfarin  2 mg Oral ONCE-1600  . Warfarin - Pharmacist Dosing Inpatient   Does not apply q1600   Continuous Infusions: . sodium chloride 50 mL/hr at 11/03/20 0851  . cefTRIAXone (ROCEPHIN)  IV Stopped (11/02/20  2370)     LOS: 2 days   Time Spent in minutes   45 minutes  Anevay Campanella D.O. on 11/03/2020 at 9:25 AM  Between 7am to 7pm - Please see pager noted on [email or web address]  After 7pm go to [email or web address]  And look for the night coverage person covering for me after hours  [facility] Office  [phone]

## 2020-11-03 NOTE — Progress Notes (Signed)
ANTICOAGULATION CONSULT NOTE - Initial Consult  Pharmacy Consult for Coumadin Indication: atrial fibrillation  Allergies  Allergen Reactions  . Codeine Nausea Only    Patient Measurements: Height: 5\' 11"  (180.3 cm) Weight: 77.5 kg (170 lb 12.8 oz) IBW/kg (Calculated) : 70.8  Vital Signs: Temp: 97.7 F (36.5 C) (12/09 0551) Temp Source: Oral (12/09 0551) BP: 105/76 (12/09 0551) Pulse Rate: 100 (12/09 0551)  Labs: Recent Labs    11/01/20 1649 11/01/20 1827 11/01/20 1849 11/01/20 2018 11/02/20 0041 11/02/20 0747 11/03/20 0312  HGB 12.0  --   --  12.6  --  11.8* 11.3*  HCT 37.2  --   --  37.0  --  36.3 33.3*  PLT 135*  --   --   --   --  160 161  LABPROT  --  28.8*  --   --   --   --  31.4*  INR  --  2.8*  --   --   --   --  3.2*  CREATININE 1.04*  --   --  1.00  --  1.10* 1.63*  TROPONINIHS 9  --  10  --  13  --   --     Estimated Creatinine Clearance: 26.2 mL/min (A) (by C-G formula based on SCr of 1.63 mg/dL (H)).   Medical History: Past Medical History:  Diagnosis Date  . Anxiety   . Atrial fibrillation ([place])   . Cancer ([place])   . Cataract    REMOVED BILATERAL  . Chest pain, unspecified   . Chronic low back pain   . Diverticulosis of colon   . History of colon cancer   . History of colonic polyps   . History of colonoscopy   . History of uterine cancer   . Hyperlipidemia   . Hypertension   . Hypothyroid   . Impaired glucose tolerance   . Mitral valve disorder   . Osteoarthritis   . Osteoporosis   . Patent foramen ovale   . Peripheral neuropathy   . Peripheral vascular disease ([place])   . Splenic infarction   . UTI (lower urinary tract infection)   . Venous insufficiency   . Vitamin B12 deficiency    Assessment:   Anticoag: Afib. INR up to 3.2. Hgb 11.3 slightly down. Plts WNL - PTA Coumadin 5mg  daily. Admit INR 2.8  Goal of Therapy:  INR 2-3 Monitor platelets by anticoagulation protocol: Yes   Plan:  Coumadin 2mg  po x 1 today Daily  INR.   Tocara Mennen S. Alford Highland, PharmD, BCPS Clinical Staff Pharmacist [email or web address] Alford Highland, [place] Isidore Stillinger 11/03/2020,7:42 AM

## 2020-11-03 NOTE — TOC Progression Note (Addendum)
Transition of Care [facility]) - Progression Note    Patient Details  Name: CORTNI TAYS MRN: [id number] Date of Birth: 22-Feb-1929  Transition of [facility]) CM/SW Contact  Zenon Mayo, RN Phone Number: 11/03/2020, 3:32 PM  Clinical Narrative:    NCM spoke with patient and her neice , [name] who states she is the POA.  [name] states she does not think her mother can go back home alone as weak as she is , NCM informed her we will get pt eval to see what they rec for her.  Patient states she does not want to go to a SNF. NCM asked her if they rec [facility] services will she be agreeable to that? She states she is not sure. NCM left [place] agency list with niece.  12/10- NCM checked with [place] to see who they use for [facility] services , they state Arthur, the Niece , Mordecai Maes states yes this is alright with her and her contact number will be the primary phone number [phone].  She does not want [facility], but she would like [place], [place].         Expected Discharge Plan and Services                                                 Social Determinants of Health (SDOH) Interventions    Readmission Risk Interventions No flowsheet data found.

## 2020-11-04 ENCOUNTER — Inpatient Hospital Stay (HOSPITAL_COMMUNITY): Payer: Medicare Other

## 2020-11-04 DIAGNOSIS — E440 Moderate protein-calorie malnutrition: Secondary | ICD-10-CM | POA: Insufficient documentation

## 2020-11-04 DIAGNOSIS — I4811 Longstanding persistent atrial fibrillation: Secondary | ICD-10-CM

## 2020-11-04 DIAGNOSIS — I342 Nonrheumatic mitral (valve) stenosis: Secondary | ICD-10-CM

## 2020-11-04 LAB — CBC
HCT: 35.5 % — ABNORMAL LOW (ref 36.0–46.0)
Hemoglobin: 11.3 g/dL — ABNORMAL LOW (ref 12.0–15.0)
MCH: 31.7 pg (ref 26.0–34.0)
MCHC: 31.8 g/dL (ref 30.0–36.0)
MCV: 99.4 fL (ref 80.0–100.0)
Platelets: 166 10*3/uL (ref 150–400)
RBC: 3.57 MIL/uL — ABNORMAL LOW (ref 3.87–5.11)
RDW: 12.8 % (ref 11.5–15.5)
WBC: 10 10*3/uL (ref 4.0–10.5)
nRBC: 0 % (ref 0.0–0.2)

## 2020-11-04 LAB — URINE CULTURE: Culture: 10000 — AB

## 2020-11-04 LAB — URINALYSIS, ROUTINE W REFLEX MICROSCOPIC
Bilirubin Urine: NEGATIVE
Glucose, UA: NEGATIVE mg/dL
Hgb urine dipstick: NEGATIVE
Ketones, ur: NEGATIVE mg/dL
Leukocytes,Ua: NEGATIVE
Nitrite: NEGATIVE
Protein, ur: NEGATIVE mg/dL
Specific Gravity, Urine: 1.017 (ref 1.005–1.030)
pH: 5 (ref 5.0–8.0)

## 2020-11-04 LAB — BASIC METABOLIC PANEL
Anion gap: 11 (ref 5–15)
BUN: 62 mg/dL — ABNORMAL HIGH (ref 8–23)
CO2: 22 mmol/L (ref 22–32)
Calcium: 8.6 mg/dL — ABNORMAL LOW (ref 8.9–10.3)
Chloride: 94 mmol/L — ABNORMAL LOW (ref 98–111)
Creatinine, Ser: 1.94 mg/dL — ABNORMAL HIGH (ref 0.44–1.00)
GFR, Estimated: 24 mL/min — ABNORMAL LOW (ref >60)
Glucose, Bld: 139 mg/dL — ABNORMAL HIGH (ref 70–99)
Potassium: 5.4 mmol/L — ABNORMAL HIGH (ref 3.5–5.1)
Sodium: 127 mmol/L — ABNORMAL LOW (ref 135–145)

## 2020-11-04 LAB — OSMOLALITY: Osmolality: 289 mOsm/kg (ref 275–295)

## 2020-11-04 LAB — PROTIME-INR
INR: 4.8 (ref 0.8–1.2)
Prothrombin Time: 43.2 seconds — ABNORMAL HIGH (ref 11.4–15.2)

## 2020-11-04 LAB — OSMOLALITY, URINE: Osmolality, Ur: 439 mOsm/kg (ref 300–900)

## 2020-11-04 LAB — SODIUM, URINE, RANDOM: Sodium, Ur: <10 mmol/L

## 2020-11-04 MED ORDER — FLEET ENEMA 7-19 GM/118ML RE ENEM
1.0000 | ENEMA | Freq: Once | RECTAL | Status: AC
  Administered 2020-11-04: 1 via RECTAL
  Filled 2020-11-04: qty 1

## 2020-11-04 MED ORDER — SODIUM ZIRCONIUM CYCLOSILICATE 5 G PO PACK
5.0000 g | PACK | Freq: Two times a day (BID) | ORAL | Status: AC
  Administered 2020-11-04 (×2): 5 g via ORAL
  Filled 2020-11-04 (×2): qty 1

## 2020-11-04 MED ORDER — POLYETHYLENE GLYCOL 3350 17 GM/SCOOP PO POWD
1.0000 | Freq: Once | ORAL | Status: DC
  Filled 2020-11-04: qty 255

## 2020-11-04 NOTE — Progress Notes (Signed)
PROGRESS NOTE    [name]  ZWC:[id number] DOB: September 15, 1929 DOA: 11/01/2020 PCP: Laurey Morale, MD   Brief Narrative:  HPI on 11/01/2020 by Dr. Gean Birchwood [name] is a 86 y.o. female with history of A. fib, mitral stenosis, chronic kidney disease stage III, chronic hyponatremia, history of colon cancer, hypothyroidism, anemia presents to the ER because of chest pain.  Patient has been having chest pain for the last 1 week mostly in the center of the chest radiating to the left arm.  Patient did have some dysuria over the last few days and had gone to the urgent care center on Saturday about 4 days ago and was given antibiotics which patient has been taking for last 3 days.  Her dysuria has improved.  But due to worsening chest pain which is pleuritic in nature radiating to her left shoulder patient presents to the ER.  Denies any nausea vomiting diarrhea has some shortness of breath.  Felt weak.  Patient received her COVID-19 booster shot vaccination a month ago exactly.  Interim history Admitted with chest pain likely secondary to pericardial effusion/pericarditis.  Started on colchicine by cardiology. Course complicated by AKI.   Assessment & Plan   Chest pain likely secondary to moderate pericardial effusion/pericarditis -CT chest no evidence of aortic dissection or aneurysm.  Cardiomegaly with severe biatrial enlargement, moderate sized pericardial effusion. -Cardiology consulted and appreciated -Currently no signs of tamponade -Echocardiogram showed EF of 55 to 60%.  No regional wall motion abnormalities.  Moderate left LVH.  LV diastolic parameters indeterminate.  RV systolic function mildly reduced.  Mitral valve degenerative, mild to moderate MV regurgitation, severe mitral stenosis.  Moderate to severe tricuspid valve regurgitation.  Severe calcification of aortic valve with trivial regurgitation and mild to moderate aortic valve stenosis. -Cardiology added colchicine,  will renally adjust dose -Continue pain control with IV Dilaudid- using at least 2 doses per day along with hydrocodone.  -Continue Xanax for anxiety  Severe mitral stenosis/elevated BNP -CT scan shows findings suggestive of heart failure developing pulmonary edema -BNP 672 -Last echocardiogram Mar 30, 2020 shows an EF of 60 to 65%.  LV diastolic function could not be evaluated. -Patient was given 1 dose of IV Lasix -Blood pressures have been soft -Cardiology consulted and appreciated -Echocardiogram as above  Acute kidney injury on Chronic kidney disease, stage IIIa with hyperkalemia -As above, patient did receive 1 dose of IV Lasix in the emergency department -Potassium remains elevated at 5.4 (despite Lokelma, potassium continues to be elevated, will reorder 2 doses of Lokelma) -Creatinine up to 1.94 (baseline 1.1) -will given IVF -Renal US: Turning of the renal parenchyma bilaterally, can be seen in medical renal disease.  No acute abnormality or obstruction -Urine electrolytes as well as repeat UA and culture ordered -Continue to monitor BMP  Chronic hyponatremia -Sodium currently 127 (baseline approximately 130-134) -Patient asymptomatic -Continue to monitor BMP  Chronic anemia -Hemoglobin stable, currently 11.3 -Continue to monitor CBC  Atrial fibrillation, with supratherapeutic INR -Continue Coumadin per pharmacy -Currently patient is tachycardic in the low 100s (106)-pain may also be contributing to this -INR 4.8 -Will hold Coumadin.  No bleeding.  Essential hypertension -BP stable -Antihypertensive medications have been held due to soft BPs  UTI/asymptomatic bacteriuria -Patient was recently treated for urinary tract infection however currently is asymptomatic -Patient was treated with Keflex as an outpatient -Urine culture returned 10,000 colonies of Pseudomonas aeruginosa  -Will discontinue ceftriaxone as this would not cover Pseudomonas -Repeat  UA and culture  pending given acute kidney injury  Hypothyroidism -Continue Synthroid  Thrombocytopenia -resolved, continue to monitor CBC  Lung opacity -Noted on CT chest, groundglass airspace opacity left upper lobe measuring 6 mm -May be secondary to pulmonary edema. -Follow-up recommendations for repeat CT in 6 to 12 months  Mildly elevated Lactic acid -likely secondary to the above -no infectious etiology   Moderate malnutrition -Nutrition consulted, continue supplements  DVT Prophylaxis Coumadin  Code Status: DNR  Family Communication: Niece at bedside  Disposition Plan:  Status is: Inpatient  Remains inpatient appropriate because:Ongoing active pain requiring inpatient pain management, Ongoing diagnostic testing needed not appropriate for outpatient work up, IV treatments appropriate due to intensity of illness or inability to take PO and Inpatient level of care appropriate due to severity of illness   Dispo: The patient is from: Home              Anticipated d/c is to: Home              Anticipated d/c date is: 3 days              Patient currently is not medically stable to d/c.  Consultants Cardiology  Procedures  None  Antibiotics   Anti-infectives (From admission, onward)   Start     Dose/Rate Route Frequency Ordered Stop   11/02/20 0500  cefTRIAXone (ROCEPHIN) 1 g in sodium chloride 0.9 % 100 mL IVPB  Status:  Discontinued        1 g 200 mL/hr over 30 Minutes Intravenous Daily 11/02/20 0446 11/04/20 0827      Subjective:   [name] seen and examined today.  Patient states her pain is better if she gets ahead of the pain with pain medications.  States that sometimes it hurts to breathe.  Has some nausea without vomiting.  Denies current abdominal pain, dizziness or headache.    Objective:   Vitals:   11/03/20 1935 11/04/20 0001 11/04/20 0449 11/04/20 0830  BP: 100/65 104/71 97/79 109/71  Pulse: 87 (!) 101 80 78  Resp: 19 18 17 20   Temp: (!) 97.5 F (36.4  C) 98.3 F (36.8 C) (!) 97.4 F (36.3 C) 97.7 F (36.5 C)  TempSrc: Oral Oral Oral Oral  SpO2: 99% 97% 95% 93%  Weight:  78.6 kg    Height:        Intake/Output Summary (Last 24 hours) at 11/04/2020 0932 Last data filed at 11/04/2020 0700 Gross per 24 hour  Intake 1376.84 ml  Output 701 ml  Net 675.84 ml   Filed Weights   11/02/20 0706 11/03/20 0300 11/04/20 0001  Weight: 77.1 kg 77.5 kg 78.6 kg   Exam  General: Well developed, elderly, NAD  HEENT: NCAT,  mucous membranes moist.   Cardiovascular: S1 S2 auscultated, irregular, 2/6 SEM  Respiratory: Clear to auscultation bilaterally with equal chest rise  Abdomen: Soft, nontender, nondistended, + bowel sounds  Extremities: warm dry without cyanosis clubbing.  Chronic stasis dermatitis, trace LE edema  Neuro: AAOx3, nonfocal  Psych: appropriate mood and affect  Data Reviewed: I have personally reviewed following labs and imaging studies  CBC: Recent Labs  Lab 11/01/20 1649 11/01/20 2018 11/02/20 0747 11/03/20 0312 11/04/20 0705  WBC 12.6*  --  16.9* 11.8* 10.0  NEUTROABS  --   --  13.6*  --   --   HGB 12.0 12.6 11.8* 11.3* 11.3*  HCT 37.2 37.0 36.3 33.3* 35.5*  MCV 100.0  --  100.3* 97.1 99.4  PLT 135*  --  160 161 678   Basic Metabolic Panel: Recent Labs  Lab 11/01/20 1649 11/01/20 2018 11/02/20 0747 11/03/20 0312 11/04/20 0233  NA 125* 126* 128* 126* 127*  K 5.7* 5.5* 5.2* 5.5* 5.4*  CL 95* 95* 95* 94* 94*  CO2 20*  --  19* 22 22  GLUCOSE 128* 136* 117* 125* 139*  BUN 34* 33* 31* 47* 62*  CREATININE 1.04* 1.00 1.10* 1.63* 1.94*  CALCIUM 9.2  --  9.3 9.3 8.6*   GFR: Estimated Creatinine Clearance: 22 mL/min (A) (by C-G formula based on SCr of 1.94 mg/dL (H)). Liver Function Tests: Recent Labs  Lab 11/01/20 1849 11/02/20 0747  AST 22 23  ALT 16 16  ALKPHOS 94 87  BILITOT 3.2* 2.9*  PROT 6.9 6.7  ALBUMIN 3.6 3.5   Recent Labs  Lab 11/01/20 1849  LIPASE 25   No results for  input(s): AMMONIA in the last 168 hours. Coagulation Profile: Recent Labs  Lab 11/01/20 1827 11/03/20 0312 11/04/20 0233  INR 2.8* 3.2* 4.8*   Cardiac Enzymes: No results for input(s): CKTOTAL, CKMB, CKMBINDEX, TROPONINI in the last 168 hours. BNP (last 3 results) No results for input(s): PROBNP in the last 8760 hours. HbA1C: No results for input(s): HGBA1C in the last 72 hours. CBG: No results for input(s): GLUCAP in the last 168 hours. Lipid Profile: No results for input(s): CHOL, HDL, LDLCALC, TRIG, CHOLHDL, LDLDIRECT in the last 72 hours. Thyroid Function Tests: Recent Labs    11/02/20 0041  TSH 0.974   Anemia Panel: No results for input(s): VITAMINB12, FOLATE, FERRITIN, TIBC, IRON, RETICCTPCT in the last 72 hours. Urine analysis:    Component Value Date/Time   COLORURINE YELLOW 11/02/2020 0542   APPEARANCEUR CLEAR 11/02/2020 0542   LABSPEC 1.034 (H) 11/02/2020 0542   PHURINE 5.0 11/02/2020 0542   GLUCOSEU NEGATIVE 11/02/2020 0542   GLUCOSEU NEGATIVE 07/15/2008 1258   HGBUR SMALL (A) 11/02/2020 0542   BILIRUBINUR NEGATIVE 11/02/2020 0542   BILIRUBINUR negative 05/25/2020 1430   KETONESUR NEGATIVE 11/02/2020 0542   PROTEINUR NEGATIVE 11/02/2020 0542   UROBILINOGEN 0.2 05/25/2020 1430   UROBILINOGEN 0.2 09/12/2014 1810   NITRITE NEGATIVE 11/02/2020 0542   LEUKOCYTESUR NEGATIVE 11/02/2020 0542   Sepsis Labs: @LABRCNTIP (procalcitonin:4,lacticidven:4)  ) Recent Results (from the past 240 hour(s))  Resp Panel by RT-PCR (Flu A&B, Covid) Nasopharyngeal Swab     Status: None   Collection Time: 11/01/20  7:58 PM   Specimen: Nasopharyngeal Swab; Nasopharyngeal(NP) swabs in vial transport medium  Result Value Ref Range Status   SARS Coronavirus 2 by RT PCR NEGATIVE NEGATIVE Final    Comment: (NOTE) SARS-CoV-2 target nucleic acids are NOT DETECTED.  The SARS-CoV-2 RNA is generally detectable in upper respiratory specimens during the acute phase of infection. The  lowest concentration of SARS-CoV-2 viral copies this assay can detect is 138 copies/mL. A negative result does not preclude SARS-Cov-2 infection and should not be used as the sole basis for treatment or other patient management decisions. A negative result may occur with  improper specimen collection/handling, submission of specimen other than nasopharyngeal swab, presence of viral mutation(s) within the areas targeted by this assay, and inadequate number of viral copies(<138 copies/mL). A negative result must be combined with clinical observations, patient history, and epidemiological information. The expected result is Negative.  Fact Sheet for Patients:  EntrepreneurPulse.com.au  Fact Sheet for Healthcare Providers:  IncredibleEmployment.be  This test is no t yet approved or  cleared by the [place] and  has been authorized for detection and/or diagnosis of SARS-CoV-2 by FDA under an Emergency Use Authorization (EUA). This EUA will remain  in effect (meaning this test can be used) for the duration of the COVID-19 declaration under Section 564(b)(1) of the Act, 21 U.S.C.section 360bbb-3(b)(1), unless the authorization is terminated  or revoked sooner.       Influenza A by PCR NEGATIVE NEGATIVE Final   Influenza B by PCR NEGATIVE NEGATIVE Final    Comment: (NOTE) The Xpert Xpress SARS-CoV-2/FLU/RSV plus assay is intended as an aid in the diagnosis of influenza from Nasopharyngeal swab specimens and should not be used as a sole basis for treatment. Nasal washings and aspirates are unacceptable for Xpert Xpress SARS-CoV-2/FLU/RSV testing.  Fact Sheet for Patients: EntrepreneurPulse.com.au  Fact Sheet for Healthcare Providers: IncredibleEmployment.be  This test is not yet approved or cleared by the [place] FDA and has been authorized for detection and/or diagnosis of SARS-CoV-2 by FDA under  an Emergency Use Authorization (EUA). This EUA will remain in effect (meaning this test can be used) for the duration of the COVID-19 declaration under Section 564(b)(1) of the Act, 21 U.S.C. section 360bbb-3(b)(1), unless the authorization is terminated or revoked.  Performed at [facility], [place] [address], [place], [place] [address]   Urine Culture     Status: Abnormal (Preliminary result)   Collection Time: 11/02/20  6:10 AM   Specimen: Urine, Random  Result Value Ref Range Status   Specimen Description URINE, RANDOM  Final   Special Requests NONE  Final   Culture (A)  Final    10,000 COLONIES/mL PSEUDOMONAS AERUGINOSA SUSCEPTIBILITIES TO FOLLOW Performed at [facility], [place] [address], [place], [place] [address]    Report Status PENDING  Incomplete      Radiology Studies: US RENAL  Result Date: 11/04/2020 CLINICAL DATA:  Acute kidney insufficiency. EXAM: RENAL / URINARY TRACT ULTRASOUND COMPLETE COMPARISON:  CT a of the abdomen 11/01/2020 FINDINGS: Right Kidney: Renal measurements: 10.8 x 4.2 x 5.6 cm = volume: 134.6 mL. There is some thinning of the renal parenchyma. It is mildly hyperechoic to the index organ, the liver. No discrete lesions are present. No stone or obstruction is present. Left Kidney: Renal measurements: 11.6 x 4.8 x 4.6 cm = volume: 134.7 mL. There is some thinning of the renal parenchyma. Parenchyma is isoechoic to the index organ, the spleen. No discrete lesions are present. No stone or obstruction is present. Bladder: Appears normal for degree of bladder distention. Other: None. IMPRESSION: 1. Thinning of the renal parenchyma bilaterally with increased echogenicity. Findings are nonspecific, but can be seen in the setting of medical renal disease. 2. No acute abnormality. No obstruction or mass lesion. Electronically Signed   By: San Morelle M.D.   On: 11/04/2020 08:17   ECHOCARDIOGRAM COMPLETE  Result Date: 11/02/2020     ECHOCARDIOGRAM REPORT   Patient Name:   [name] Date of Exam: 11/02/2020 Medical Rec #:  [id number]      Height:       71.0 in Accession #:    [id number]     Weight:       170.0 lb Date of Birth:  1929-03-28      BSA:          1.968 m Patient Age:    28 years       BP:  100/71 mmHg Patient Gender: F              HR:           106 bpm. Exam Location:  Inpatient Procedure: 2D Echo, Color Doppler and Cardiac Doppler Indications:    Pericardial effusion 423.9 / I31.3  History:        Patient has prior history of Echocardiogram examinations, most                 recent 03/30/2020. CHF; Mitral Valve Disease and Aortic Valve                 Disease. Chronic atrial fibrillation. COVID-19. PFO. Anxiety.                 Chest pain. Peripheral vascular disease. Chronic kidney disease                 stage III,.  Sonographer:    Darlina Sicilian RDCS Referring Phys: [place]  1. Left ventricular ejection fraction, by estimation, is 55 to 60%. The left ventricle has normal function. The left ventricle has no regional wall motion abnormalities. There is moderate left ventricular hypertrophy. Left ventricular diastolic parameters are indeterminate. There is the interventricular septum is flattened in systole and diastole, consistent with right ventricular pressure and volume overload.  2. Right ventricular systolic function is moderately reduced. The right ventricular size is moderately enlarged. There is moderately elevated pulmonary artery systolic pressure. The estimated right ventricular systolic pressure is 84.1 mmHg.  3. Left atrial size was severely dilated.  4. Right atrial size was mildly dilated.  5. The mitral valve is degenerative. Mild to moderate mitral valve regurgitation. Severe mitral stenosis. Severe mitral annular calcification. MG 19 mmHg at HR 117 bpm, MVA 0.7 cm^2 by continuity equation  6. Tricuspid valve regurgitation is moderate to severe.  7. The aortic valve is  tricuspid. There is severe calcifcation of the aortic valve. Aortic valve regurgitation is trivial. Mild to moderate aortic valve stenosis. Mild AS by gradients (Vmax 2.5 m/s, MG 14 mmHG), likely due to low SV (SVi 21 cc/m^2). AS is moderate by AVA (1.1 cm^2) and DI (0.31). Suspect moderate AS.  8. The inferior vena cava is dilated in size with <50% respiratory variability, suggesting right atrial pressure of 15 mmHg.  9. Moderate pericardial effusion measuring 1.5cm adjacent to LV posterior wall. No evidence of tamponade. IVC is fixed/dilated, but no RA/RV collapse or significant mitral inflow respiratory variation seen. FINDINGS  Left Ventricle: Left ventricular ejection fraction, by estimation, is 55 to 60%. The left ventricle has normal function. The left ventricle has no regional wall motion abnormalities. The left ventricular internal cavity size was small. There is moderate  left ventricular hypertrophy. The interventricular septum is flattened in systole and diastole, consistent with right ventricular pressure and volume overload. Left ventricular diastolic parameters are indeterminate. Right Ventricle: The right ventricular size is moderately enlarged. Right vetricular wall thickness was not assessed. Right ventricular systolic function is moderately reduced. There is moderately elevated pulmonary artery systolic pressure. The tricuspid regurgitant velocity is 3.34 m/s, and with an assumed right atrial pressure of 15 mmHg, the estimated right ventricular systolic pressure is 66.0 mmHg. Left Atrium: Left atrial size was severely dilated. Right Atrium: Right atrial size was mildly dilated. Pericardium: Moderate pericardial effusion measuring 1.2cm adjacent to LV posterior wall. No evidence of tamponade. IVC is fixed/dilated, but no RA/RV collapse or significant mitral inflow respiratory variation  seen. A moderately sized pericardial effusion is present. Mitral Valve: The mitral valve is degenerative in  appearance. Severe mitral annular calcification. Mild to moderate mitral valve regurgitation. Severe mitral valve stenosis. MV peak gradient, 25.3 mmHg. The mean mitral valve gradient is 16.3 mmHg. Tricuspid Valve: The tricuspid valve is normal in structure. Tricuspid valve regurgitation is moderate to severe. Aortic Valve: The aortic valve is tricuspid. There is severe calcifcation of the aortic valve. Aortic valve regurgitation is trivial. Mild to moderate aortic stenosis is present. Aortic valve mean gradient measures 11.7 mmHg. Aortic valve peak gradient measures 22.3 mmHg. Aortic valve area, by VTI measures 1.19 cm. Pulmonic Valve: The pulmonic valve was not well visualized. Pulmonic valve regurgitation is trivial. Aorta: The aortic root is normal in size and structure. Venous: The inferior vena cava is dilated in size with less than 50% respiratory variability, suggesting right atrial pressure of 15 mmHg. IAS/Shunts: The interatrial septum was not well visualized.  LEFT VENTRICLE PLAX 2D LVIDd:         3.70 cm LVIDs:         2.40 cm LV PW:         1.20 cm LV IVS:        1.20 cm LVOT diam:     2.10 cm LV SV:         41 LV SV Index:   21 LVOT Area:     3.46 cm  RIGHT VENTRICLE TAPSE (M-mode): 1.0 cm LEFT ATRIUM              Index        RIGHT ATRIUM           Index LA diam:        7.90 cm  4.01 cm/m   RA Area:     25.40 cm LA Vol (A2C):   239.0 ml 121.45 ml/m RA Volume:   77.40 ml  39.33 ml/m LA Vol (A4C):   246.0 ml 125.01 ml/m LA Biplane Vol: 243.0 ml 123.49 ml/m  AORTIC VALVE AV Area (Vmax):    1.25 cm AV Area (Vmean):   1.26 cm AV Area (VTI):     1.19 cm AV Vmax:           236.00 cm/s AV Vmean:          156.000 cm/s AV VTI:            0.348 m AV Peak Grad:      22.3 mmHg AV Mean Grad:      11.7 mmHg LVOT Vmax:         84.85 cm/s LVOT Vmean:        56.750 cm/s LVOT VTI:          0.120 m LVOT/AV VTI ratio: 0.34  AORTA Ao Root diam: 3.40 cm MITRAL VALVE                 TRICUSPID VALVE MV Area (PHT):  1.51 cm      TR Peak grad:   44.6 mmHg MV Peak grad:  25.3 mmHg     TR Vmax:        334.00 cm/s MV Mean grad:  16.3 mmHg MV Vmax:       2.51 m/s      SHUNTS MV Vmean:      194.0 cm/s    Systemic VTI:  0.12 m MV Decel Time: 504 msec      Systemic Diam: 2.10 cm MR Peak grad:  79.6 mmHg MR Mean grad:    53.0 mmHg MR Vmax:         446.00 cm/s MR Vmean:        350.0 cm/s MR PISA:         1.90 cm MR PISA Eff ROA: 15 mm MR PISA Radius:  0.55 cm MV E velocity: 255.00 cm/s Oswaldo Milian MD Electronically signed by Oswaldo Milian MD Signature Date/Time: 11/02/2020/2:28:29 PM    Final      Scheduled Meds: . atorvastatin  20 mg Oral Daily  . colchicine  0.6 mg Oral Daily  . feeding supplement  237 mL Oral TID BM  . ketorolac  1 drop Right Eye BID  . levothyroxine  100 mcg Oral Q0600  . multivitamin with minerals  1 tablet Oral Daily  . pantoprazole  40 mg Oral Daily  . polyvinyl alcohol  1 drop Both Eyes BID  . sodium zirconium cyclosilicate  5 g Oral BID  . vitamin B-12  1,000 mcg Oral Daily  . Warfarin - Pharmacist Dosing Inpatient   Does not apply q1600   Continuous Infusions: . sodium chloride 75 mL/hr at 11/04/20 0641     LOS: 3 days   Time Spent in minutes   45 minutes  [name] D.O. on 11/04/2020 at 9:32 AM  Between 7am to 7pm - Please see pager noted on [email or web address]  After 7pm go to [email or web address]  And look for the night coverage person covering for me after hours  [facility] Office  [phone]

## 2020-11-04 NOTE — Progress Notes (Signed)
   11/04/20 1600  Clinical Encounter Type  Visited With Patient  Visit Type Initial  Referral From Nurse  Consult/Referral To Chaplain  Spiritual Encounters  Spiritual Needs Prayer;Emotional  Stress Factors  Patient Stress Factors None identified  Family Stress Factors None identified  Chaplain visited patient per consult. Patient was sleeping on and off. I don't know if she will remember that I was there. I will notify Chaplain coming on to check on her.

## 2020-11-04 NOTE — Progress Notes (Signed)
ANTICOAGULATION CONSULT NOTE - Follow Up Consult  Pharmacy Consult for Coumadin Indication: atrial fibrillation  Allergies  Allergen Reactions  . Codeine Nausea Only    Patient Measurements: Height: 5\' 11"  (180.3 cm) Weight: 78.6 kg (173 lb 4.5 oz) IBW/kg (Calculated) : 70.8  Vital Signs: Temp: 97.4 F (36.3 C) (12/10 0449) Temp Source: Oral (12/10 0449) BP: 97/79 (12/10 0449) Pulse Rate: 80 (12/10 0449)  Labs: Recent Labs    11/01/20 1649 11/01/20 1827 11/01/20 1849 11/01/20 2018 11/02/20 0041 11/02/20 0747 11/03/20 0312 11/04/20 0233 11/04/20 0705  HGB 12.0  --   --    < >  --  11.8* 11.3*  --  11.3*  HCT 37.2  --   --    < >  --  36.3 33.3*  --  35.5*  PLT 135*  --   --   --   --  160 161  --  166  LABPROT  --  28.8*  --   --   --   --  31.4* 43.2*  --   INR  --  2.8*  --   --   --   --  3.2* 4.8*  --   CREATININE 1.04*  --   --    < >  --  1.10* 1.63* 1.94*  --   TROPONINIHS 9  --  10  --  13  --   --   --   --    < > = values in this interval not displayed.    Estimated Creatinine Clearance: 22 mL/min (A) (by C-G formula based on SCr of 1.94 mg/dL (H)).   Medical History: Past Medical History:  Diagnosis Date  . Anxiety   . Atrial fibrillation ([place])   . Cancer ([place])   . Cataract    REMOVED BILATERAL  . Chest pain, unspecified   . Chronic low back pain   . Diverticulosis of colon   . History of colon cancer   . History of colonic polyps   . History of colonoscopy   . History of uterine cancer   . Hyperlipidemia   . Hypertension   . Hypothyroid   . Impaired glucose tolerance   . Mitral valve disorder   . Osteoarthritis   . Osteoporosis   . Patent foramen ovale   . Peripheral neuropathy   . Peripheral vascular disease ([place])   . Splenic infarction   . UTI (lower urinary tract infection)   . Venous insufficiency   . Vitamin B12 deficiency    Assessment: 86 yo female presented on 11/01/2020 with chest pain found to be secondary to moderate  pericardial effusion/pericarditis. Patient is on warfarin prior to admission for Afib. INR was 2.8 on admission. Today, INR of 4.8 is supratherapeutic. Hgb 11.3. Plt 166. Per RN no reported bleeding. Increase in INR may be due to decreased oral intake (0% meals eaten documented yesterday) and patient is also on ceftriaxone which may increase INR.   Prior to admission warfarin regimen: 5mg  warfarin daily    Goal of Therapy:  INR 2-3 Monitor platelets by anticoagulation protocol: Yes   Plan:  Hold warfarin today Monitor daily INR, CBC and S/S of bleeding   Cristela Felt, PharmD Clinical Pharmacist  11/04/2020,7:45 AM

## 2020-11-04 NOTE — Evaluation (Signed)
Physical Therapy Evaluation Patient Details Name: [name] MRN: [id number] DOB: [birth year] Today's Date: 11/04/2020   History of Present Illness  Patient is a 86 y/o female who presents with SOB and chest pain. CT-moderate pericardial effusion with CHF/pulmonary edema. EKG- A-fib rate controlled. PMH includes A-fib, CA, HTN, mitral valve disorder, OA, peripheral neuropathy, PVD, spinal surgery.  Clinical Impression  Patient presents with generalized weakness, impaired balance, nausea, decreased activity tolerance and impaired mobility s/p above. Pt lives in a Senior Living apt and reports being Mod I for ADls/IADLs except driving PTA. Reports no falls and uses SPC for ambulation. Today, pt requires Min guard for transfers and gait training with use of RW for support. Limited by nausea and weakness. HR ranged from 90s-131 bpm with Sp02 >92% on RA. Left pt on RA at end of session. RN aware. Will follow acutely to maximize independence and mobility prior to return home.    Follow Up Recommendations Home health PT;Supervision - Intermittent    Equipment Recommendations  None recommended by PT    Recommendations for Other Services       Precautions / Restrictions Precautions Precautions: Fall Precaution Comments: nausea Restrictions Weight Bearing Restrictions: No      Mobility  Bed Mobility Overal bed mobility: Needs Assistance Bed Mobility: Supine to Sit     Supine to sit: Supervision;HOB elevated     General bed mobility comments: No assist needed, use of rail to get to EOB.    Transfers Overall transfer level: Needs assistance Equipment used: Rolling walker (2 wheeled) Transfers: Sit to/from Stand Sit to Stand: Min guard         General transfer comment: Min guard for safety. Stood from [employer], transferred to chair post ambulation.  Ambulation/Gait Ambulation/Gait assistance: Min guard Gait Distance (Feet): 75 Feet Assistive device: Rolling walker (2  wheeled) Gait Pattern/deviations: Step-to pattern;Step-through pattern;Narrow base of support;Shuffle Gait velocity: decreased Gait velocity interpretation: <1.31 ft/sec, indicative of household ambulator General Gait Details: Very slow, guarded gait with short step length bilaterally. + nausea, worsened with mobility limiting distance. HR 90s-131 bpm Afib, Sp02 remained >92% on RA.  Stairs            Wheelchair Mobility    Modified Rankin (Stroke Patients Only)       Balance Overall balance assessment: Needs assistance Sitting-balance support: Feet supported;No upper extremity supported Sitting balance-Leahy Scale: Good Sitting balance - Comments: Able to assist with donning shoes/socks   Standing balance support: During functional activity Standing balance-Leahy Scale: Fair Standing balance comment: Able to stand statically wtihout UE support but requires UE support for walking due to weakness this morniing.                             Pertinent Vitals/Pain Pain Assessment: No/denies pain    Home Living Family/patient expects to be discharged to:: Other (Comment) (Senior Living Apt) Living Arrangements: Alone Available Help at Discharge: Friend(s);Family;Available PRN/intermittently Type of Home: Apartment Home Access: Elevator     Home Layout: One level Home Equipment: Cane - single point;Shower seat (sits outside the shower)      Prior Function Level of Independence: Independent with assistive device(s)         Comments: Uses SPC for stabilit; does her own laundry/cooking. Does not drive, niece helps with grocery shopping. No falls reported.     Hand Dominance   Dominant Hand: Right    Extremity/Trunk Assessment  Upper Extremity Assessment Upper Extremity Assessment: Defer to OT evaluation    Lower Extremity Assessment Lower Extremity Assessment: Generalized weakness;RLE deficits/detail;LLE deficits/detail RLE Deficits / Details:  Charcot foot on right, wears orthotics RLE Sensation: decreased light touch;history of peripheral neuropathy LLE Sensation: history of peripheral neuropathy;decreased light touch       Communication   Communication: No difficulties  Cognition Arousal/Alertness: Awake/alert Behavior During Therapy: WFL for tasks assessed/performed Overall Cognitive Status: Within Functional Limits for tasks assessed                                 General Comments: Appears WFL for basic mobility tasks.      General Comments General comments (skin integrity, edema, etc.): HR 90s-131 bpm; Sp02 remained >92% on RA throughout session. Remained on RA at end of session, Rn notified.    Exercises     Assessment/Plan    PT Assessment Patient needs continued PT services  PT Problem List Decreased strength;Decreased mobility;Decreased balance;Impaired sensation;Decreased activity tolerance       PT Treatment Interventions Therapeutic activities;Gait training;Therapeutic exercise;Patient/family education;Balance training;Functional mobility training    PT Goals (Current goals can be found in the Care Plan section)  Acute Rehab PT Goals Patient Stated Goal: to get home PT Goal Formulation: With patient Time For Goal Achievement: 11/18/20 Potential to Achieve Goals: Good    Frequency Min 3X/week   Barriers to discharge Decreased caregiver support lives alone    Co-evaluation               AM-PAC PT "6 Clicks" Mobility  Outcome Measure Help needed turning from your back to your side while in a flat bed without using bedrails?: None Help needed moving from lying on your back to sitting on the side of a flat bed without using bedrails?: A Little Help needed moving to and from a bed to a chair (including a wheelchair)?: A Little Help needed standing up from a chair using your arms (e.g., wheelchair or bedside chair)?: A Little Help needed to walk in hospital room?: A Little Help  needed climbing 3-5 steps with a railing? : A Little 6 Click Score: 19    End of Session Equipment Utilized During Treatment: Gait belt Activity Tolerance: Other (comment) (limited by nausea) Patient left: in chair;with call bell/phone within reach;with chair alarm set Nurse Communication: Mobility status PT Visit Diagnosis: Difficulty in walking, not elsewhere classified (R26.2);Muscle weakness (generalized) (M62.81)    Time: 5277-8242 PT Time Calculation (min) (ACUTE ONLY): 31 min   Charges:   PT Evaluation $PT Eval Moderate Complexity: 1 Mod PT Treatments $Gait Training: 8-22 mins        Marisa Severin, PT, DPT Acute Rehabilitation Services Pager [phone] Office [place] 11/04/2020, 10:09 AM

## 2020-11-04 NOTE — Progress Notes (Addendum)
Pt had large brown bowel movement after enema administration.

## 2020-11-04 NOTE — Progress Notes (Signed)
Patient requested for miralax earlier today, however refuses to take miralax at this time.  Pt and pt's niece requested for a fleet enema due to constipation.   Attempt to administer enema at this time. Patient prefers nurse to come back after she eats her dinner.   Patient had a small bowel movement. 350 ml urine output collected. Urine sample will be sent to lab.  Will continue to monitor.

## 2020-11-04 NOTE — Evaluation (Signed)
Occupational Therapy Evaluation Patient Details Name: [name] MRN: [id number] DOB: April 29, 1929 Today's Date: 11/04/2020    History of Present Illness Patient is a 86 y/o female who presents with SOB and chest pain. CT-moderate pericardial effusion with CHF/pulmonary edema. EKG- A-fib rate controlled. PMH includes A-fib, CA, HTN, mitral valve disorder, OA, peripheral neuropathy, PVD, spinal surgery.   Clinical Impression   Pt presents wit decline in function and safety with ADLs and ADL mobility with impaired strength, balance and endurance. Pt with functional deficits listed below requiring mn guard A for LB ADL and functional mobility. HR ranged from 90s-131 bpm with Sp02 >92% on RA. Pt on RA  Throughout and at end of session. Pt would benefit from acute OT services to address impairments to maximize level of function and safety    Follow Up Recommendations  Home health OT;Supervision - Intermittent    Equipment Recommendations  Other (comment) Management consultant)    Recommendations for Other Services       Precautions / Restrictions Precautions Precautions: Fall Precaution Comments: nausea Restrictions Weight Bearing Restrictions: No      Mobility Bed Mobility               General bed mobility comments: pt in recliner upon arrival    Transfers Overall transfer level: Needs assistance Equipment used: Rolling walker (2 wheeled) Transfers: Sit to/from Stand Sit to Stand: Min guard         General transfer comment: Min guard for safety    Balance Overall balance assessment: Needs assistance Sitting-balance support: Feet supported;No upper extremity supported Sitting balance-Leahy Scale: Good Sitting balance - Comments: Able to assist with donning shoes/socks   Standing balance support: During functional activity Standing balance-Leahy Scale: Fair                             ADL either performed or assessed with clinical judgement   ADL Overall  ADL's : Needs assistance/impaired Eating/Feeding: Independent;Sitting   Grooming: Wash/dry hands;Wash/dry face;Min guard;Standing   Upper Body Bathing: Supervision/ safety;Sitting   Lower Body Bathing: Min guard;Sit to/from stand   Upper Body Dressing : Supervision/safety;Sitting   Lower Body Dressing: Min guard;Sit to/from stand   Toilet Transfer: Min guard;Ambulation;RW   Toileting- Water quality scientist and Hygiene: Min guard;Sit to/from stand       Functional mobility during ADLs: Min guard;Rolling walker       Vision Baseline Vision/History: Wears glasses Patient Visual Report: No change from baseline       Perception     Praxis      Pertinent Vitals/Pain Pain Assessment: No/denies pain     Hand Dominance Right   Extremity/Trunk Assessment Upper Extremity Assessment Upper Extremity Assessment: Generalized weakness   Lower Extremity Assessment Lower Extremity Assessment: Defer to PT evaluation   Cervical / Trunk Assessment Cervical / Trunk Assessment: Normal   Communication Communication Communication: No difficulties   Cognition Arousal/Alertness: Awake/alert Behavior During Therapy: WFL for tasks assessed/performed Overall Cognitive Status: Within Functional Limits for tasks assessed                                     General Comments       Exercises     Shoulder Instructions      Home Living Family/patient expects to be discharged to:: Other (Comment) (senior apt) Living Arrangements: Alone Available Help at Discharge:  Friend(s);Family;Available PRN/intermittently;Other (Comment) (pt's niece/POA) Type of Home: Apartment Home Access: Elevator     Home Layout: One level     Bathroom Shower/Tub: Occupational psychologist: Handicapped height     Home Equipment: [place] - single point;Shower seat          Prior Functioning/Environment Level of Independence: Independent with assistive device(s)    ADL's /  Homemaking Assistance Needed: Ind with ADLs/selfcare, cooking, laundry   Comments: Uses SPC, does her own laundry/cooking. Does not drive, niece helps with grocery shopping. No falls reported.        OT Problem List: Decreased activity tolerance;Decreased strength;Impaired balance (sitting and/or standing)      OT Treatment/Interventions: Self-care/ADL training;Therapeutic exercise;Energy conservation;DME and/or AE instruction;Balance training;Patient/family education    OT Goals(Current goals can be found in the care plan section) Acute Rehab OT Goals Patient Stated Goal: to get home OT Goal Formulation: With patient/family Time For Goal Achievement: 11/18/20 Potential to Achieve Goals: Good ADL Goals Pt Will Perform Grooming: with supervision;with modified independence;standing Pt Will Perform Lower Body Bathing: with supervision;with modified independence;sit to/from stand Pt Will Perform Lower Body Dressing: with supervision;with modified independence;sit to/from stand Pt Will Transfer to Toilet: with supervision;with modified independence;ambulating Pt Will Perform Toileting - Clothing Manipulation and hygiene: with supervision;with modified independence;sit to/from stand  OT Frequency: Min 2X/week   Barriers to D/C:            Co-evaluation              AM-PAC OT "6 Clicks" Daily Activity     Outcome Measure Help from another person eating meals?: None Help from another person taking care of personal grooming?: A Little Help from another person toileting, which includes using toliet, bedpan, or urinal?: A Little Help from another person bathing (including washing, rinsing, drying)?: A Little Help from another person to put on and taking off regular upper body clothing?: None Help from another person to put on and taking off regular lower body clothing?: A Little 6 Click Score: 20   End of Session Equipment Utilized During Treatment: Gait belt;Rolling  walker  Activity Tolerance: Other (comment) (nausea) Patient left: with call bell/phone within reach;in bed;with family/visitor present  OT Visit Diagnosis: Muscle weakness (generalized) (M62.81);Unsteadiness on feet (R26.81)                Time: 8891-6945 OT Time Calculation (min): 35 min Charges:  OT General Charges $OT Visit: 1 Visit OT Evaluation $OT Eval Moderate Complexity: 1 Mod OT Treatments $Self Care/Home Management : 8-22 mins    Emmit Alexanders [facility] 11/04/2020, 2:09 PM

## 2020-11-04 NOTE — Consult Note (Signed)
   [facility] CM Inpatient Consult   11/04/2020  [name] 05/18/1929 [id number]  [facility] [[facility]]  Accountable Care Organization [ACO] Patient:   Patient screened for high risk score for potential unplanned readmission risk andto check if potential [facility]  [[facility]] Care Management service needs.  Review of patient's medical record reveals patient is recommended for home with health and lives at a senior living apartment per notes. Met with patient [asleep] and niece at bedside.  Introduced self and program as in network with insurance and primary MD office.  Primary Care Provider is  Stephen Fry, MD  this provider is listed to provide the transition of care [TOC] for post hospital follow up.  Plan:  Niece, [name], given a brochure and 24 hour nurse advice line. Continue to follow progress and disposition to assess for post hospital care management needs.  No current needs from care management at this time.  Please place a [facility] Care Management consult as appropriate and for questions contact:   Victoria Brewer, RN BSN CCM [facility] Hospital Liaison  [phone] business mobile phone Toll free office [phone]  Fax number: [phone] [email or web address]       

## 2020-11-04 NOTE — Progress Notes (Signed)
Progress Note  Patient Name: [name] Date of Encounter: 11/04/2020  [facility] Cardiologist: Chloe Rouge, MD   Subjective   86 year old female with history of atrial fibrillation, moderate aortic stenosis, severe mitral stenosis, moderate mitral regurgitation.  She presents with several days of intense pleuritic chest pain.  CT scan of the chest is negative for aortic dissection. Echocardiogram reveals severe mitral stenosis with at least moderate mitral regurgitation, severe tricuspid regurgitation Pericardial effusion Moderate aortic stenosis  Pain is better controlled.  She is back toward baseline    Inpatient Medications    Scheduled Meds: . atorvastatin  20 mg Oral Daily  . colchicine  0.6 mg Oral Daily  . feeding supplement  237 mL Oral TID BM  . ketorolac  1 drop Right Eye BID  . levothyroxine  100 mcg Oral Q0600  . multivitamin with minerals  1 tablet Oral Daily  . pantoprazole  40 mg Oral Daily  . polyvinyl alcohol  1 drop Both Eyes BID  . sodium zirconium cyclosilicate  5 g Oral BID  . vitamin B-12  1,000 mcg Oral Daily  . Warfarin - Pharmacist Dosing Inpatient   Does not apply q1600   Continuous Infusions: . sodium chloride 75 mL/hr at 11/04/20 0641   PRN Meds: ALPRAZolam, fentaNYL (SUBLIMAZE) injection, HYDROcodone-acetaminophen, HYDROmorphone (DILAUDID) injection, ondansetron (ZOFRAN) IV   Vital Signs    Vitals:   11/03/20 1935 11/04/20 0001 11/04/20 0449 11/04/20 0830  BP: 100/65 104/71 97/79 109/71  Pulse: 87 (!) 101 80 78  Resp: 19 18 17 20   Temp: (!) 97.5 F (36.4 C) 98.3 F (36.8 C) (!) 97.4 F (36.3 C) 97.7 F (36.5 C)  TempSrc: Oral Oral Oral Oral  SpO2: 99% 97% 95% 93%  Weight:  78.6 kg    Height:        Intake/Output Summary (Last 24 hours) at 11/04/2020 0901 Last data filed at 11/04/2020 0700 Gross per 24 hour  Intake 1376.84 ml  Output 701 ml  Net 675.84 ml   Last 3 Weights 11/04/2020 11/03/2020 11/02/2020   Weight (lbs) 173 lb 4.5 oz 170 lb 12.8 oz 170 lb  Weight (kg) 78.6 kg 77.474 kg 77.111 kg      Telemetry    Atrial fib mostly rate controlled at times to 121 - Personally Reviewed  ECG    No new - Personally Reviewed  Physical Exam   Physical Exam: Blood pressure 109/71, pulse 78, temperature 97.7 F (36.5 C), temperature source Oral, resp. rate 20, height 5\' 11"  (1.803 m), weight 78.6 kg, SpO2 93 %.  GEN:   Elderly , frail, chronically ill appearing female,   HEENT: Normal NECK: No JVD; No carotid bruits LYMPHATICS: No lymphadenopathy CARDIAC: irreg. Irreg,  3/6 systolic, soft diastolic murmur  RESPIRATORY:  Clear to auscultation without rales, wheezing or rhonchi  ABDOMEN: Soft, non-tender, non-distended MUSCULOSKELETAL:  No edema; No deformity  SKIN: Warm and dry NEUROLOGIC:  Alert and oriented x 3   Labs    High Sensitivity Troponin:   Recent Labs  Lab 11/01/20 1649 11/01/20 1849 11/02/20 0041  TROPONINIHS 9 10 13       Chemistry Recent Labs  Lab 11/01/20 1849 11/01/20 2018 11/02/20 0747 11/03/20 0312 11/04/20 0233  NA  --    < > 128* 126* 127*  K  --    < > 5.2* 5.5* 5.4*  CL  --    < > 95* 94* 94*  CO2  --   --  19* 22 22  GLUCOSE  --    < > 117* 125* 139*  BUN  --    < > 31* 47* 62*  CREATININE  --    < > 1.10* 1.63* 1.94*  CALCIUM  --   --  9.3 9.3 8.6*  PROT 6.9  --  6.7  --   --   ALBUMIN 3.6  --  3.5  --   --   AST 22  --  23  --   --   ALT 16  --  16  --   --   ALKPHOS 94  --  87  --   --   BILITOT 3.2*  --  2.9*  --   --   GFRNONAA  --   --  48* 30* 24*  ANIONGAP  --   --  14 10 11    < > = values in this interval not displayed.     Hematology Recent Labs  Lab 11/02/20 0747 11/03/20 0312 11/04/20 0705  WBC 16.9* 11.8* 10.0  RBC 3.62* 3.43* 3.57*  HGB 11.8* 11.3* 11.3*  HCT 36.3 33.3* 35.5*  MCV 100.3* 97.1 99.4  MCH 32.6 32.9 31.7  MCHC 32.5 33.9 31.8  RDW 13.0 13.0 12.8  PLT 160 161 166    BNP Recent Labs  Lab  11/01/20 1827  BNP 672.0*     DDimer No results for input(s): DDIMER in the last 168 hours.   Radiology    US RENAL  Result Date: 11/04/2020 CLINICAL DATA:  Acute kidney insufficiency. EXAM: RENAL / URINARY TRACT ULTRASOUND COMPLETE COMPARISON:  CT a of the abdomen 11/01/2020 FINDINGS: Right Kidney: Renal measurements: 10.8 x 4.2 x 5.6 cm = volume: 134.6 mL. There is some thinning of the renal parenchyma. It is mildly hyperechoic to the index organ, the liver. No discrete lesions are present. No stone or obstruction is present. Left Kidney: Renal measurements: 11.6 x 4.8 x 4.6 cm = volume: 134.7 mL. There is some thinning of the renal parenchyma. Parenchyma is isoechoic to the index organ, the spleen. No discrete lesions are present. No stone or obstruction is present. Bladder: Appears normal for degree of bladder distention. Other: None. IMPRESSION: 1. Thinning of the renal parenchyma bilaterally with increased echogenicity. Findings are nonspecific, but can be seen in the setting of medical renal disease. 2. No acute abnormality. No obstruction or mass lesion. Electronically Signed   By: San Morelle M.D.   On: 11/04/2020 08:17   ECHOCARDIOGRAM COMPLETE  Result Date: 11/02/2020    ECHOCARDIOGRAM REPORT   Patient Name:   [name] Date of Exam: 11/02/2020 Medical Rec #:  [id number]      Height:       71.0 in Accession #:    [id number]     Weight:       170.0 lb Date of Birth:  21-Jul-1929      BSA:          1.968 m Patient Age:    86 years       BP:           100/71 mmHg Patient Gender: F              HR:           106 bpm. Exam Location:  Inpatient Procedure: 2D Echo, Color Doppler and Cardiac Doppler Indications:    Pericardial effusion 423.9 / I31.3  History:        Patient has  prior history of Echocardiogram examinations, most                 recent 03/30/2020. CHF; Mitral Valve Disease and Aortic Valve                 Disease. Chronic atrial fibrillation. COVID-19. PFO. Anxiety.                  Chest pain. Peripheral vascular disease. Chronic kidney disease                 stage III,.  Sonographer:    Darlina Sicilian RDCS Referring Phys: [place]  1. Left ventricular ejection fraction, by estimation, is 55 to 60%. The left ventricle has normal function. The left ventricle has no regional wall motion abnormalities. There is moderate left ventricular hypertrophy. Left ventricular diastolic parameters are indeterminate. There is the interventricular septum is flattened in systole and diastole, consistent with right ventricular pressure and volume overload.  2. Right ventricular systolic function is moderately reduced. The right ventricular size is moderately enlarged. There is moderately elevated pulmonary artery systolic pressure. The estimated right ventricular systolic pressure is 93.8 mmHg.  3. Left atrial size was severely dilated.  4. Right atrial size was mildly dilated.  5. The mitral valve is degenerative. Mild to moderate mitral valve regurgitation. Severe mitral stenosis. Severe mitral annular calcification. MG 19 mmHg at HR 117 bpm, MVA 0.7 cm^2 by continuity equation  6. Tricuspid valve regurgitation is moderate to severe.  7. The aortic valve is tricuspid. There is severe calcifcation of the aortic valve. Aortic valve regurgitation is trivial. Mild to moderate aortic valve stenosis. Mild AS by gradients (Vmax 2.5 m/s, MG 14 mmHG), likely due to low SV (SVi 21 cc/m^2). AS is moderate by AVA (1.1 cm^2) and DI (0.31). Suspect moderate AS.  8. The inferior vena cava is dilated in size with <50% respiratory variability, suggesting right atrial pressure of 15 mmHg.  9. Moderate pericardial effusion measuring 1.5cm adjacent to LV posterior wall. No evidence of tamponade. IVC is fixed/dilated, but no RA/RV collapse or significant mitral inflow respiratory variation seen. FINDINGS  Left Ventricle: Left ventricular ejection fraction, by estimation, is 55 to 60%. The left  ventricle has normal function. The left ventricle has no regional wall motion abnormalities. The left ventricular internal cavity size was small. There is moderate  left ventricular hypertrophy. The interventricular septum is flattened in systole and diastole, consistent with right ventricular pressure and volume overload. Left ventricular diastolic parameters are indeterminate. Right Ventricle: The right ventricular size is moderately enlarged. Right vetricular wall thickness was not assessed. Right ventricular systolic function is moderately reduced. There is moderately elevated pulmonary artery systolic pressure. The tricuspid regurgitant velocity is 3.34 m/s, and with an assumed right atrial pressure of 15 mmHg, the estimated right ventricular systolic pressure is 10.1 mmHg. Left Atrium: Left atrial size was severely dilated. Right Atrium: Right atrial size was mildly dilated. Pericardium: Moderate pericardial effusion measuring 1.2cm adjacent to LV posterior wall. No evidence of tamponade. IVC is fixed/dilated, but no RA/RV collapse or significant mitral inflow respiratory variation seen. A moderately sized pericardial effusion is present. Mitral Valve: The mitral valve is degenerative in appearance. Severe mitral annular calcification. Mild to moderate mitral valve regurgitation. Severe mitral valve stenosis. MV peak gradient, 25.3 mmHg. The mean mitral valve gradient is 16.3 mmHg. Tricuspid Valve: The tricuspid valve is normal in structure. Tricuspid valve regurgitation is moderate to severe. Aortic Valve:  The aortic valve is tricuspid. There is severe calcifcation of the aortic valve. Aortic valve regurgitation is trivial. Mild to moderate aortic stenosis is present. Aortic valve mean gradient measures 11.7 mmHg. Aortic valve peak gradient measures 22.3 mmHg. Aortic valve area, by VTI measures 1.19 cm. Pulmonic Valve: The pulmonic valve was not well visualized. Pulmonic valve regurgitation is trivial.  Aorta: The aortic root is normal in size and structure. Venous: The inferior vena cava is dilated in size with less than 50% respiratory variability, suggesting right atrial pressure of 15 mmHg. IAS/Shunts: The interatrial septum was not well visualized.  LEFT VENTRICLE PLAX 2D LVIDd:         3.70 cm LVIDs:         2.40 cm LV PW:         1.20 cm LV IVS:        1.20 cm LVOT diam:     2.10 cm LV SV:         41 LV SV Index:   21 LVOT Area:     3.46 cm  RIGHT VENTRICLE TAPSE (M-mode): 1.0 cm LEFT ATRIUM              Index        RIGHT ATRIUM           Index LA diam:        7.90 cm  4.01 cm/m   RA Area:     25.40 cm LA Vol (A2C):   239.0 ml 121.45 ml/m RA Volume:   77.40 ml  39.33 ml/m LA Vol (A4C):   246.0 ml 125.01 ml/m LA Biplane Vol: 243.0 ml 123.49 ml/m  AORTIC VALVE AV Area (Vmax):    1.25 cm AV Area (Vmean):   1.26 cm AV Area (VTI):     1.19 cm AV Vmax:           236.00 cm/s AV Vmean:          156.000 cm/s AV VTI:            0.348 m AV Peak Grad:      22.3 mmHg AV Mean Grad:      11.7 mmHg LVOT Vmax:         84.85 cm/s LVOT Vmean:        56.750 cm/s LVOT VTI:          0.120 m LVOT/AV VTI ratio: 0.34  AORTA Ao Root diam: 3.40 cm MITRAL VALVE                 TRICUSPID VALVE MV Area (PHT): 1.51 cm      TR Peak grad:   44.6 mmHg MV Peak grad:  25.3 mmHg     TR Vmax:        334.00 cm/s MV Mean grad:  16.3 mmHg MV Vmax:       2.51 m/s      SHUNTS MV Vmean:      194.0 cm/s    Systemic VTI:  0.12 m MV Decel Time: 504 msec      Systemic Diam: 2.10 cm MR Peak grad:    79.6 mmHg MR Mean grad:    53.0 mmHg MR Vmax:         446.00 cm/s MR Vmean:        350.0 cm/s MR PISA:         1.90 cm MR PISA Eff ROA: 15 mm MR PISA Radius:  0.55 cm MV E velocity: 255.00  cm/s Oswaldo Milian MD Electronically signed by Oswaldo Milian MD Signature Date/Time: 11/02/2020/2:28:29 PM    Final     Cardiac Studies   Echo 11/03/20 IMPRESSIONS    1. Left ventricular ejection fraction, by estimation, is 55 to 60%. The   left ventricle has normal function. The left ventricle has no regional  wall motion abnormalities. There is moderate left ventricular hypertrophy.  Left ventricular diastolic  parameters are indeterminate. There is the interventricular septum is  flattened in systole and diastole, consistent with right ventricular  pressure and volume overload.  2. Right ventricular systolic function is moderately reduced. The right  ventricular size is moderately enlarged. There is moderately elevated  pulmonary artery systolic pressure. The estimated right ventricular  systolic pressure is 28.3 mmHg.  3. Left atrial size was severely dilated.  4. Right atrial size was mildly dilated.  5. The mitral valve is degenerative. Mild to moderate mitral valve  regurgitation. Severe mitral stenosis. Severe mitral annular  calcification. MG 19 mmHg at HR 117 bpm, MVA 0.7 cm^2 by continuity  equation  6. Tricuspid valve regurgitation is moderate to severe.  7. The aortic valve is tricuspid. There is severe calcifcation of the  aortic valve. Aortic valve regurgitation is trivial. Mild to moderate  aortic valve stenosis. Mild AS by gradients (Vmax 2.5 m/s, MG 14 mmHG),  likely due to low SV (SVi 21 cc/m^2). AS  is moderate by AVA (1.1 cm^2) and DI (0.31). Suspect moderate AS.  8. The inferior vena cava is dilated in size with <50% respiratory  variability, suggesting right atrial pressure of 15 mmHg.  9. Moderate pericardial effusion measuring 1.5cm adjacent to LV posterior  wall. No evidence of tamponade. IVC is fixed/dilated, but no RA/RV  collapse or significant mitral inflow respiratory variation seen.   FINDINGS  Left Ventricle: Left ventricular ejection fraction, by estimation, is 55  to 60%. The left ventricle has normal function. The left ventricle has no  regional wall motion abnormalities. The left ventricular internal cavity  size was small. There is moderate  left ventricular hypertrophy.  The interventricular septum is flattened in  systole and diastole, consistent with right ventricular pressure and  volume overload. Left ventricular diastolic parameters are indeterminate.   Right Ventricle: The right ventricular size is moderately enlarged. Right  vetricular wall thickness was not assessed. Right ventricular systolic  function is moderately reduced. There is moderately elevated pulmonary  artery systolic pressure. The  tricuspid regurgitant velocity is 3.34 m/s, and with an assumed right  atrial pressure of 15 mmHg, the estimated right ventricular systolic  pressure is 66.2 mmHg.   Left Atrium: Left atrial size was severely dilated.   Right Atrium: Right atrial size was mildly dilated.   Pericardium: Moderate pericardial effusion measuring 1.2cm adjacent to LV  posterior wall. No evidence of tamponade. IVC is fixed/dilated, but no  RA/RV collapse or significant mitral inflow respiratory variation seen. A  moderately sized pericardial  effusion is present.   Mitral Valve: The mitral valve is degenerative in appearance. Severe  mitral annular calcification. Mild to moderate mitral valve regurgitation.  Severe mitral valve stenosis. MV peak gradient, 25.3 mmHg. The mean mitral  valve gradient is 16.3 mmHg.   Tricuspid Valve: The tricuspid valve is normal in structure. Tricuspid  valve regurgitation is moderate to severe.   Aortic Valve: The aortic valve is tricuspid. There is severe calcifcation  of the aortic valve. Aortic valve regurgitation is trivial. Mild to  moderate aortic stenosis is  present. Aortic valve mean gradient measures  11.7 mmHg. Aortic valve peak gradient  measures 22.3 mmHg. Aortic valve area, by VTI measures 1.19 cm.   Pulmonic Valve: The pulmonic valve was not well visualized. Pulmonic valve  regurgitation is trivial.   Aorta: The aortic root is normal in size and structure.   Venous: The inferior vena cava is dilated in size with less  than 50%  respiratory variability, suggesting right atrial pressure of 15 mmHg.   Patient Profile     86 y.o. female history of atrial fibrillation, moderate aortic stenosis, severe mitral stenosis, moderate mitral regurgitation.    Assessment & Plan    1.  Severe pleuritic chest pain: The patient has been given some Dilaudid which is helped with her severe pleuritic chest pain.  Has also received colchicine for possible pericarditis. From a cardiac standpoint, she appears close to baseline and can probably be discharged.   She has reduced PO intake,   Nausea    2.  Severe mitral stenosis: Valve is very calcified. She is not a candidate for balloon valvuloplasty.  She remains a DNR.  3.  Acute CHF due to MS; BNP 672   I&O neg 38  She does not appear to have any worsening shortness of breath.  She has severe mitral stenosis.  4.  Atrial fib-chronic:   INR was 4.8 today.  This is being managed by pharmacy.   5.  Hyponatremia with Na 127   6. AKI with Cr now 1.63 up from 1.10  Per IM   7.  Nausea :  Has poor po intake,  As a result, her INR is elevated, creatinine is up She may need a day or so for this to improved before she goes home      Mertie Moores, MD  11/04/2020 9:13 AM    [facility] [address]  [place], [place]  [address] Phone: [phone]; Fax: [phone]

## 2020-11-04 NOTE — TOC Initial Note (Signed)
Transition of Care [facility]) - Initial/Assessment Note    Patient Details  Name: [name] MRN: [id number] Date of Birth: 20-Nov-1929  Transition of Care [facility]) CM/SW Contact:    Zenon Mayo, RN Phone Number: 11/04/2020, 3:44 PM  Clinical Narrative:                 Patient is set up with [facility] for [place], [place] at [facility], referral given to [facility] with [facility].  Soc will begin 24 to 48 hrs post dc.   Expected Discharge Plan: [place] Barriers to Discharge: Continued Medical Work up   Patient Goals and CMS Choice Patient states their goals for this hospitalization and ongoing recovery are:: go home CMS Medicare.gov Compare Post Acute Care list provided to:: Patient Represenative (must comment) Choice offered to / list presented to : Adult Children  Expected Discharge Plan and Services Expected Discharge Plan: [place]   Discharge Planning Services: CM Consult Post Acute Care Choice: [place] arrangements for the past 2 months: [place]                   DME Agency: NA       HH Arranged: PT,OT [place] Agency: [place] Date [place]: 11/04/20 Time HH Agency Contacted: 10 Representative spoke with at [place]: Tommi Rumps  Prior Living Arrangements/Services Living arrangements for the past 2 months: Materials engineer Lives with:: Self Patient language and need for interpreter reviewed:: Yes Do you feel safe going back to the place where you live?: Yes      Need for Family Participation in Patient Care: Yes (Comment) Care giver support system in place?: Yes (comment)   Criminal Activity/Legal Involvement Pertinent to Current Situation/Hospitalization: No - Comment as needed  Activities of Daily Living Home Assistive Devices/Equipment: Cane (specify quad or straight) (straight) ADL Screening (condition at time of admission) Patient's cognitive ability adequate to  safely complete daily activities?: Yes Is the patient deaf or have difficulty hearing?: No Does the patient have difficulty seeing, even when wearing glasses/contacts?: No Does the patient have difficulty concentrating, remembering, or making decisions?: No Patient able to express need for assistance with ADLs?: Yes Does the patient have difficulty dressing or bathing?: No Independently performs ADLs?: Yes (appropriate for developmental age) Does the patient have difficulty walking or climbing stairs?: Yes (uses cane at home) Weakness of Legs: Both Weakness of Arms/Hands: None  Permission Sought/Granted                  Emotional Assessment Appearance:: Appears stated age Attitude/Demeanor/Rapport: Engaged Affect (typically observed): Appropriate Orientation: : Oriented to Self,Oriented to Place,Oriented to  Time,Oriented to Situation Alcohol / Substance Use: Not Applicable Psych Involvement: No (comment)  Admission diagnosis:  Pericardial effusion [I31.3] Acute CHF (congestive heart failure) ([place]) [I50.9] Chest pain [R07.9] Acute on chronic congestive heart failure, unspecified heart failure type [facility]) [I50.9] Patient Active Problem List   Diagnosis Date Noted  . Malnutrition of moderate degree 11/04/2020  . Pericarditis   . Acute CHF (congestive heart failure) ([place]) 11/01/2020  . Chest pain 11/01/2020  . Cystoid macular edema of right eye 07/04/2020  . Right epiretinal membrane 07/04/2020  . Early stage nonexudative age-related macular degeneration of both eyes 07/04/2020  . Hyponatremia 05/25/2020  . COVID-19 virus infection 12/08/2019  . NSTEMI (non-ST elevated myocardial infarction) ([place]) 02/12/2019  . Chest pain of uncertain etiology 60/73/7106  . Sore throat  10/30/2018  . Fatigue 10/30/2018  . Dysuria 10/30/2018  . Hypoxemia 01/16/2018  . Lower respiratory infection 01/16/2018  . History of colon cancer in adulthood 01/16/2018  . Diastolic congestive heart  failure with preserved left ventricular function, NYHA class 2 ([place]) 01/16/2018  . SBO (small bowel obstruction) ([place]) 04/02/2017  . CAP (community acquired pneumonia) 02/24/2016  . Fever blister 02/24/2016  . Bilateral pneumonia 02/17/2016  . Acute respiratory failure with hypercapnia ([place]) 02/17/2016  . Acute diastolic heart failure ([place])   . Dehydration 12/15/2015  . Sepsis ([place]) 12/15/2015  . Enteritis 12/15/2015  . Diarrhea   . Charcot's joint of left foot 11/24/2015  . Vasculitis ([place]) 03/11/2015  . Aortic stenosis 09/14/2013  . Long term current use of anticoagulant 01/09/2011  . COLONIC POLYPS 10/29/2010  . DIVERTICULOSIS OF COLON 10/29/2010  . ABNORMAL CV (STRESS) TEST 04/20/2010  . CHEST PAIN UNSPECIFIED 04/17/2010  . B12 deficiency 12/23/2008  . IMPAIRED GLUCOSE TOLERANCE 12/23/2008  . PERIPHERAL NEUROPATHY 06/22/2008  . SPLENIC INFARCTION 02/20/2008  . Anxiety state 02/20/2008  . Moderate mitral regurgitation 02/20/2008  . PERIPHERAL VASCULAR DISEASE 02/20/2008  . Venous (peripheral) insufficiency 02/20/2008  . URINARY TRACT INFECTION 02/20/2008  . Osteoarthritis 02/20/2008  . OSTEOPOROSIS 02/20/2008  . UTERINE CANCER, HX OF 02/20/2008  . Malignant neoplasm of colon ([place]) 01/13/2008  . Hypothyroidism 01/13/2008  . Hyperlipemia 01/13/2008  . Essential hypertension 01/13/2008  . ATRIAL FIBRILLATION 01/13/2008  . LOW BACK PAIN, CHRONIC 01/13/2008   PCP:  Laurey Morale, MD Pharmacy:   [id number] - [place], [place] [address] Phone: [phone] Fax: [phone]  [place], [address] [place], [address] [place], [place] [address] [place] [address] Phone: [phone] Fax: [phone]     Social Determinants of Health (SDOH) Interventions    Readmission Risk Interventions Readmission Risk Prevention Plan 11/04/2020  Transportation Screening Complete   PCP or Specialist Appt within 3-5 Days Complete  HRI or [place] Complete  Social Work Consult for [place] Planning/Counseling Complete  Palliative Care Screening Complete  Medication Review Press photographer) Complete  Some recent data might be hidden

## 2020-11-05 DIAGNOSIS — R071 Chest pain on breathing: Secondary | ICD-10-CM

## 2020-11-05 LAB — CBC
HCT: 31.7 % — ABNORMAL LOW (ref 36.0–46.0)
Hemoglobin: 10.9 g/dL — ABNORMAL LOW (ref 12.0–15.0)
MCH: 33 pg (ref 26.0–34.0)
MCHC: 34.4 g/dL (ref 30.0–36.0)
MCV: 96.1 fL (ref 80.0–100.0)
Platelets: 181 10*3/uL (ref 150–400)
RBC: 3.3 MIL/uL — ABNORMAL LOW (ref 3.87–5.11)
RDW: 12.8 % (ref 11.5–15.5)
WBC: 8.5 10*3/uL (ref 4.0–10.5)
nRBC: 0 % (ref 0.0–0.2)

## 2020-11-05 LAB — BASIC METABOLIC PANEL
Anion gap: 9 (ref 5–15)
BUN: 63 mg/dL — ABNORMAL HIGH (ref 8–23)
CO2: 21 mmol/L — ABNORMAL LOW (ref 22–32)
Calcium: 8.4 mg/dL — ABNORMAL LOW (ref 8.9–10.3)
Chloride: 96 mmol/L — ABNORMAL LOW (ref 98–111)
Creatinine, Ser: 1.78 mg/dL — ABNORMAL HIGH (ref 0.44–1.00)
GFR, Estimated: 27 mL/min — ABNORMAL LOW (ref >60)
Glucose, Bld: 140 mg/dL — ABNORMAL HIGH (ref 70–99)
Potassium: 4.6 mmol/L (ref 3.5–5.1)
Sodium: 126 mmol/L — ABNORMAL LOW (ref 135–145)

## 2020-11-05 LAB — PROTIME-INR
INR: 6.6 (ref 0.8–1.2)
Prothrombin Time: 55.9 seconds — ABNORMAL HIGH (ref 11.4–15.2)

## 2020-11-05 MED ORDER — PHYTONADIONE 5 MG PO TABS
2.5000 mg | ORAL_TABLET | Freq: Once | ORAL | Status: AC
  Administered 2020-11-05: 08:00:00 2.5 mg via ORAL
  Filled 2020-11-05: qty 1

## 2020-11-05 MED ORDER — PROCHLORPERAZINE EDISYLATE 10 MG/2ML IJ SOLN
5.0000 mg | Freq: Once | INTRAMUSCULAR | Status: AC | PRN
  Administered 2020-11-05: 22:00:00 5 mg via INTRAVENOUS
  Filled 2020-11-05: qty 1

## 2020-11-05 MED ORDER — ONDANSETRON HCL 4 MG PO TABS
4.0000 mg | ORAL_TABLET | Freq: Two times a day (BID) | ORAL | Status: DC
  Administered 2020-11-05 – 2020-11-09 (×9): 4 mg via ORAL
  Filled 2020-11-05 (×10): qty 1

## 2020-11-05 NOTE — Progress Notes (Signed)
PROGRESS NOTE    [name]  IWO:[id number] DOB: 08/22/1929 DOA: 11/01/2020 PCP: Laurey Morale, MD   Brief Narrative:  HPI on 11/01/2020 by Dr. Gean Birchwood [name] is a 86 y.o. female with history of A. fib, mitral stenosis, chronic kidney disease stage III, chronic hyponatremia, history of colon cancer, hypothyroidism, anemia presents to the ER because of chest pain.  Patient has been having chest pain for the last 1 week mostly in the center of the chest radiating to the left arm.  Patient did have some dysuria over the last few days and had gone to the urgent care center on Saturday about 4 days ago and was given antibiotics which patient has been taking for last 3 days.  Her dysuria has improved.  But due to worsening chest pain which is pleuritic in nature radiating to her left shoulder patient presents to the ER.  Denies any nausea vomiting diarrhea has some shortness of breath.  Felt weak.  Patient received her COVID-19 booster shot vaccination a month ago exactly.  Interim history Admitted with chest pain likely secondary to pericardial effusion/pericarditis.  Started on colchicine by cardiology. Course complicated by AKI and supratherapeutic INR.  Assessment & Plan   Chest pain likely secondary to moderate pericardial effusion/pericarditis -CT chest no evidence of aortic dissection or aneurysm.  Cardiomegaly with severe biatrial enlargement, moderate sized pericardial effusion. -Cardiology consulted and appreciated -Currently no signs of tamponade -Echocardiogram showed EF of 55 to 60%.  No regional wall motion abnormalities.  Moderate left LVH.  LV diastolic parameters indeterminate.  RV systolic function mildly reduced.  Mitral valve degenerative, mild to moderate MV regurgitation, severe mitral stenosis.  Moderate to severe tricuspid valve regurgitation.  Severe calcification of aortic valve with trivial regurgitation and mild to moderate aortic valve  stenosis. -Cardiology added colchicine, will renally adjust dose -Continue pain control with IV Dilaudid- used 3 doses yesterday along with hydrocodone.  -Continue Xanax for anxiety  Severe mitral stenosis/elevated BNP- Acute diastolic CHF  -CT scan shows findings suggestive of heart failure developing pulmonary edema -BNP 672 -Last echocardiogram Mar 30, 2020 shows an EF of 60 to 65%.  LV diastolic function could not be evaluated. -Patient was given 1 dose of IV Lasix -Blood pressures have improved -Cardiology consulted and appreciated -Echocardiogram as above  Acute kidney injury on Chronic kidney disease, stage IIIa with hyperkalemia -As above, patient did receive 1 dose of IV Lasix in the emergency department -Potassium remains elevated at 5.4 (despite Lokelma, potassium continues to be elevated, will reorder 2 doses of Lokelma) -Creatinine peaked to 1.94 (baseline 1.1)- now down to 1.78 -Renal US: Turning of the renal parenchyma bilaterally, can be seen in medical renal disease.  No acute abnormality or obstruction -Urine electrolytes as well as repeat UA and culture ordered -Continue gentle IVF to monitor BMP  Chronic hyponatremia -Sodium currently 126 (baseline approximately 130-134) -Patient asymptomatic -Continue to monitor BMP  Chronic anemia -Hemoglobin stable, currently 10.9 -Continue to monitor CBC  Atrial fibrillation, with supratherapeutic INR -Continue Coumadin per pharmacy -Currently patient is tachycardic in the low 100s (106)-pain may also be contributing to this -INR 6.6 -Continue to hold Coumadin.  No bleeding. -patient given dose of vitamin K overnight  Essential hypertension -BP stable -Antihypertensive medications have been held due to soft BPs  UTI/asymptomatic bacteriuria -Patient was recently treated for urinary tract infection however currently is asymptomatic -Patient was treated with Keflex as an outpatient -Urine culture returned 10,000  colonies  of Pseudomonas aeruginosa  -Will discontinue ceftriaxone as this would not cover Pseudomonas -Repeat UA and culture pending given acute kidney injury  Hypothyroidism -Continue Synthroid  Thrombocytopenia -resolved, continue to monitor CBC  Lung opacity -Noted on CT chest, groundglass airspace opacity left upper lobe measuring 6 mm -May be secondary to pulmonary edema. -Follow-up recommendations for repeat CT in 6 to 12 months  Mildly elevated Lactic acid -likely secondary to the above -no infectious etiology   Nausea -Has been ongoing for over a year -Started patient on PPI -Continue antiemetics as needed as well as 2 doses scheduled -Discussed with niece via phone, nausea seems to worsen with movement -?  If patient has orthostasis, will obtain orthostatic vitals -Continue to monitor  Moderate malnutrition -In the context of chronic illness -Patient tells me she has had poor appetite since having Covid over 1 year ago -Nutrition consulted -Continue supplements  -Patient may consider discussing with her PCP use of appetite stimulants although this will take several weeks to see any results  DVT Prophylaxis Coumadin  Code Status: DNR  Family Communication: None at bedside. Niece via phone  Disposition Plan:  Status is: Inpatient  Remains inpatient appropriate because:Ongoing active pain requiring inpatient pain management, Ongoing diagnostic testing needed not appropriate for outpatient work up, IV treatments appropriate due to intensity of illness or inability to take PO and Inpatient level of care appropriate due to severity of illness   Dispo: The patient is from: Home              Anticipated d/c is to: Home              Anticipated d/c date is: 3 days              Patient currently is not medically stable to d/c.  Consultants Cardiology  Procedures  None  Antibiotics   Anti-infectives (From admission, onward)   Start     Dose/Rate Route  Frequency Ordered Stop   11/02/20 0500  cefTRIAXone (ROCEPHIN) 1 g in sodium chloride 0.9 % 100 mL IVPB  Status:  Discontinued        1 g 200 mL/hr over 30 Minutes Intravenous Daily 11/02/20 0446 11/04/20 0827      Subjective:   [name] seen and examined today.  Patient feels pain has mildly improved.  Felt better after having a bowel movement yesterday.  Denies current chest pain or shortness of breath.  Denies abdominal pain, dizziness or headache.  Has had nausea for some time (since last November) as well as decreased appetite which she attributes to having Covid over a year ago.     Objective:   Vitals:   11/04/20 1936 11/05/20 0007 11/05/20 0405 11/05/20 0733  BP: 107/74 106/82 99/73 110/80  Pulse: 84 73 85 88  Resp: 19 18 17 16   Temp: 97.6 F (36.4 C) 98 F (36.7 C) 97.8 F (36.6 C) 98.3 F (36.8 C)  TempSrc: Oral Oral Oral Oral  SpO2: 98% 96% 96% 99%  Weight:  77.2 kg    Height:        Intake/Output Summary (Last 24 hours) at 11/05/2020 1027 Last data filed at 11/05/2020 0815 Gross per 24 hour  Intake 2812.56 ml  Output 1050 ml  Net 1762.56 ml   Filed Weights   11/03/20 0300 11/04/20 0001 11/05/20 0007  Weight: 77.5 kg 78.6 kg 77.2 kg   Exam  General: Well developed, chronically ill-appearing, thin, NAD  HEENT: NCAT,  mucous membranes  moist.   Cardiovascular: S1 S2 auscultated, irregular, 2/6 SEM  Respiratory: Clear to auscultation bilaterally  Abdomen: Soft, nontender, nondistended, + bowel sounds  Extremities: warm dry without cyanosis clubbing  Neuro: AAOx3, nonfocal  Psych: Appropriate mood and affect, pleasant   Data Reviewed: I have personally reviewed following labs and imaging studies  CBC: Recent Labs  Lab 11/01/20 1649 11/01/20 2018 11/02/20 0747 11/03/20 0312 11/04/20 0705 11/05/20 0308  WBC 12.6*  --  16.9* 11.8* 10.0 8.5  NEUTROABS  --   --  13.6*  --   --   --   HGB 12.0 12.6 11.8* 11.3* 11.3* 10.9*  HCT 37.2 37.0 36.3  33.3* 35.5* 31.7*  MCV 100.0  --  100.3* 97.1 99.4 96.1  PLT 135*  --  160 161 166 841   Basic Metabolic Panel: Recent Labs  Lab 11/01/20 1649 11/01/20 2018 11/02/20 0747 11/03/20 0312 11/04/20 0233 11/05/20 0308  NA 125* 126* 128* 126* 127* 126*  K 5.7* 5.5* 5.2* 5.5* 5.4* 4.6  CL 95* 95* 95* 94* 94* 96*  CO2 20*  --  19* 22 22 21*  GLUCOSE 128* 136* 117* 125* 139* 140*  BUN 34* 33* 31* 47* 62* 63*  CREATININE 1.04* 1.00 1.10* 1.63* 1.94* 1.78*  CALCIUM 9.2  --  9.3 9.3 8.6* 8.4*   GFR: Estimated Creatinine Clearance: 23.9 mL/min (A) (by C-G formula based on SCr of 1.78 mg/dL (H)). Liver Function Tests: Recent Labs  Lab 11/01/20 1849 11/02/20 0747  AST 22 23  ALT 16 16  ALKPHOS 94 87  BILITOT 3.2* 2.9*  PROT 6.9 6.7  ALBUMIN 3.6 3.5   Recent Labs  Lab 11/01/20 1849  LIPASE 25   No results for input(s): AMMONIA in the last 168 hours. Coagulation Profile: Recent Labs  Lab 11/01/20 1827 11/03/20 0312 11/04/20 0233 11/05/20 0308  INR 2.8* 3.2* 4.8* 6.6*   Cardiac Enzymes: No results for input(s): CKTOTAL, CKMB, CKMBINDEX, TROPONINI in the last 168 hours. BNP (last 3 results) No results for input(s): PROBNP in the last 8760 hours. HbA1C: No results for input(s): HGBA1C in the last 72 hours. CBG: No results for input(s): GLUCAP in the last 168 hours. Lipid Profile: No results for input(s): CHOL, HDL, LDLCALC, TRIG, CHOLHDL, LDLDIRECT in the last 72 hours. Thyroid Function Tests: No results for input(s): TSH, T4TOTAL, FREET4, T3FREE, THYROIDAB in the last 72 hours. Anemia Panel: No results for input(s): VITAMINB12, FOLATE, FERRITIN, TIBC, IRON, RETICCTPCT in the last 72 hours. Urine analysis:    Component Value Date/Time   COLORURINE AMBER (A) 11/04/2020 1727   APPEARANCEUR HAZY (A) 11/04/2020 1727   LABSPEC 1.017 11/04/2020 1727   PHURINE 5.0 11/04/2020 1727   GLUCOSEU NEGATIVE 11/04/2020 1727   GLUCOSEU NEGATIVE 07/15/2008 1258   HGBUR NEGATIVE  11/04/2020 1727   BILIRUBINUR NEGATIVE 11/04/2020 1727   BILIRUBINUR negative 05/25/2020 1430   KETONESUR NEGATIVE 11/04/2020 1727   PROTEINUR NEGATIVE 11/04/2020 1727   UROBILINOGEN 0.2 05/25/2020 1430   UROBILINOGEN 0.2 09/12/2014 1810   NITRITE NEGATIVE 11/04/2020 1727   LEUKOCYTESUR NEGATIVE 11/04/2020 1727   Sepsis Labs: @LABRCNTIP (procalcitonin:4,lacticidven:4)  ) Recent Results (from the past 240 hour(s))  Resp Panel by RT-PCR (Flu A&B, Covid) Nasopharyngeal Swab     Status: None   Collection Time: 11/01/20  7:58 PM   Specimen: Nasopharyngeal Swab; Nasopharyngeal(NP) swabs in vial transport medium  Result Value Ref Range Status   SARS Coronavirus 2 by RT PCR NEGATIVE NEGATIVE Final    Comment: (NOTE)  SARS-CoV-2 target nucleic acids are NOT DETECTED.  The SARS-CoV-2 RNA is generally detectable in upper respiratory specimens during the acute phase of infection. The lowest concentration of SARS-CoV-2 viral copies this assay can detect is 138 copies/mL. A negative result does not preclude SARS-Cov-2 infection and should not be used as the sole basis for treatment or other patient management decisions. A negative result may occur with  improper specimen collection/handling, submission of specimen other than nasopharyngeal swab, presence of viral mutation(s) within the areas targeted by this assay, and inadequate number of viral copies(<138 copies/mL). A negative result must be combined with clinical observations, patient history, and epidemiological information. The expected result is Negative.  Fact Sheet for Patients:  EntrepreneurPulse.com.au  Fact Sheet for Healthcare Providers:  IncredibleEmployment.be  This test is no t yet approved or cleared by the [place] FDA and  has been authorized for detection and/or diagnosis of SARS-CoV-2 by FDA under an Emergency Use Authorization (EUA). This EUA will remain  in effect (meaning  this test can be used) for the duration of the COVID-19 declaration under Section 564(b)(1) of the Act, 21 U.S.C.section 360bbb-3(b)(1), unless the authorization is terminated  or revoked sooner.       Influenza A by PCR NEGATIVE NEGATIVE Final   Influenza B by PCR NEGATIVE NEGATIVE Final    Comment: (NOTE) The Xpert Xpress SARS-CoV-2/FLU/RSV plus assay is intended as an aid in the diagnosis of influenza from Nasopharyngeal swab specimens and should not be used as a sole basis for treatment. Nasal washings and aspirates are unacceptable for Xpert Xpress SARS-CoV-2/FLU/RSV testing.  Fact Sheet for Patients: EntrepreneurPulse.com.au  Fact Sheet for Healthcare Providers: IncredibleEmployment.be  This test is not yet approved or cleared by the [place] FDA and has been authorized for detection and/or diagnosis of SARS-CoV-2 by FDA under an Emergency Use Authorization (EUA). This EUA will remain in effect (meaning this test can be used) for the duration of the COVID-19 declaration under Section 564(b)(1) of the Act, 21 U.S.C. section 360bbb-3(b)(1), unless the authorization is terminated or revoked.  Performed at [facility], [place] [address], [place], [place] [address]   Urine Culture     Status: Abnormal   Collection Time: 11/02/20  6:10 AM   Specimen: Urine, Random  Result Value Ref Range Status   Specimen Description URINE, RANDOM  Final   Special Requests   Final    NONE Performed at [facility], [place] [address], [place], [place] [address]    Culture 10,000 COLONIES/mL PSEUDOMONAS AERUGINOSA (A)  Final   Report Status 11/04/2020 FINAL  Final   Organism ID, Bacteria PSEUDOMONAS AERUGINOSA (A)  Final      Susceptibility   Pseudomonas aeruginosa - MIC*    CEFTAZIDIME <=1 SENSITIVE Sensitive     CIPROFLOXACIN 0.5 SENSITIVE Sensitive     GENTAMICIN <=1 SENSITIVE Sensitive     IMIPENEM 2 SENSITIVE Sensitive     PIP/TAZO  <=4 SENSITIVE Sensitive     CEFEPIME 0.5 SENSITIVE Sensitive     * 10,000 COLONIES/mL PSEUDOMONAS AERUGINOSA      Radiology Studies: US RENAL  Result Date: 11/04/2020 CLINICAL DATA:  Acute kidney insufficiency. EXAM: RENAL / URINARY TRACT ULTRASOUND COMPLETE COMPARISON:  CT a of the abdomen 11/01/2020 FINDINGS: Right Kidney: Renal measurements: 10.8 x 4.2 x 5.6 cm = volume: 134.6 mL. There is some thinning of the renal parenchyma. It is mildly hyperechoic to the index organ, the liver. No discrete lesions are present. No  stone or obstruction is present. Left Kidney: Renal measurements: 11.6 x 4.8 x 4.6 cm = volume: 134.7 mL. There is some thinning of the renal parenchyma. Parenchyma is isoechoic to the index organ, the spleen. No discrete lesions are present. No stone or obstruction is present. Bladder: Appears normal for degree of bladder distention. Other: None. IMPRESSION: 1. Thinning of the renal parenchyma bilaterally with increased echogenicity. Findings are nonspecific, but can be seen in the setting of medical renal disease. 2. No acute abnormality. No obstruction or mass lesion. Electronically Signed   By: San Morelle M.D.   On: 11/04/2020 08:17     Scheduled Meds:  atorvastatin  20 mg Oral Daily   colchicine  0.6 mg Oral Daily   feeding supplement  237 mL Oral TID BM   ketorolac  1 drop Right Eye BID   levothyroxine  100 mcg Oral Q0600   multivitamin with minerals  1 tablet Oral Daily   ondansetron  4 mg Oral Q12H   pantoprazole  40 mg Oral Daily   polyethylene glycol powder  1 Container Oral Once   polyvinyl alcohol  1 drop Both Eyes BID   vitamin B-12  1,000 mcg Oral Daily   Warfarin - Pharmacist Dosing Inpatient   Does not apply q1600   Continuous Infusions:  sodium chloride 75 mL/hr at 11/05/20 0815     LOS: 4 days   Time Spent in minutes   45 minutes  Tetsuo Coppola D.O. on 11/05/2020 at 10:27 AM  Between 7am to 7pm - Please see pager noted  on [email or web address]  After 7pm go to [email or web address]  And look for the night coverage person covering for me after hours  [facility] Office  [phone]

## 2020-11-05 NOTE — Progress Notes (Signed)
ANTICOAGULATION CONSULT NOTE - Follow Up Consult  Pharmacy Consult for Coumadin Indication: atrial fibrillation  Allergies  Allergen Reactions  . Codeine Nausea Only    Patient Measurements: Height: 5\' 11"  (180.3 cm) Weight: 77.2 kg (170 lb 3.1 oz) IBW/kg (Calculated) : 70.8  Vital Signs: Temp: 98.3 F (36.8 C) (12/11 0733) Temp Source: Oral (12/11 0733) BP: 110/80 (12/11 0733) Pulse Rate: 88 (12/11 0733)  Labs: Recent Labs    11/03/20 0312 11/04/20 0233 11/04/20 0705 11/05/20 0308  HGB 11.3*  --  11.3* 10.9*  HCT 33.3*  --  35.5* 31.7*  PLT 161  --  166 181  LABPROT 31.4* 43.2*  --  55.9*  INR 3.2* 4.8*  --  6.6*  CREATININE 1.63* 1.94*  --  1.78*    Estimated Creatinine Clearance: 23.9 mL/min (A) (by C-G formula based on SCr of 1.78 mg/dL (H)).   Medical History: Past Medical History:  Diagnosis Date  . Anxiety   . Atrial fibrillation ([place])   . Cancer ([place])   . Cataract    REMOVED BILATERAL  . Chest pain, unspecified   . Chronic low back pain   . Diverticulosis of colon   . History of colon cancer   . History of colonic polyps   . History of colonoscopy   . History of uterine cancer   . Hyperlipidemia   . Hypertension   . Hypothyroid   . Impaired glucose tolerance   . Mitral valve disorder   . Osteoarthritis   . Osteoporosis   . Patent foramen ovale   . Peripheral neuropathy   . Peripheral vascular disease ([place])   . Splenic infarction   . UTI (lower urinary tract infection)   . Venous insufficiency   . Vitamin B12 deficiency    Assessment: 86 yo female presented on 11/01/2020 with chest pain found to be secondary to moderate pericardial effusion/pericarditis. Patient is on warfarin prior to admission for Afib. INR was 2.8 on admission. Today, INR continues to increase to 6.6. Hgb is down. Plt stable. Per RN no reported bleeding. Patient's diet improved yesterday, ceftriaxone has been stopped and vitamin K 2.5mg  by mouth was given this morning.  Supratherapeutic level most likely still reflecting prior doses.  Prior to admission warfarin regimen: 5mg  warfarin daily   Goal of Therapy:  INR 2-3 Monitor platelets by anticoagulation protocol: Yes   Plan:  Hold warfarin today Monitor daily INR, CBC and S/S of bleeding   Romilda Garret, PharmD PGY1 Acute Care Pharmacy Resident Phone: [phone] 11/05/2020 8:05 AM  Please check [email or web address] for unit specific pharmacy phone numbers.

## 2020-11-05 NOTE — Progress Notes (Signed)
As patient niece was leaving we were discussing patient's nauseas. Niece stated in passing that patient has a history of vertigo and take medicine for it when needed; however, this nausea has not felt like previous episodes of vertigo so patient has not utilized the med. Will discuss with MD possibility of persistent nausea being related to inner ear or vertigo issue and perhaps trial midodrine or something similar.

## 2020-11-05 NOTE — Progress Notes (Signed)
Patient alert and oriented x 4, resps even and unlabored, lungs clear in all lobes. HR irregular but patient denies any symptoms related to HR. Patient continues to complain of nausea requiring PRN zofran; she has since been ordered routine ODT zofran BID/q12hrs. This is effective in relieving her nausea. Patient continent and is able to utilize the [facility] commode for bowel movements and urination, but she does use purewick at times due to difficulty in transferring; She transfers x 1 assist. Patient has denied pain today and has not required pain meds since yesterday afternoon. Patient's coumadin is still on hold for a critical INR of 6.6 this AM; vitamin K has been administered already, and we are monitoring closely for s/s of bleeding. Patient is trying to eat vitamin K rich foods as well such as broccoli, as she states that helps on an outpatient basis. She is very fatigued but otherwise feels well. She is still using 2L oxygen via [place] and lips turn purpleish if she is without it for two long. Call light in reach, able to make needs known. Will monitor.

## 2020-11-05 NOTE — Progress Notes (Addendum)
CRITICAL VALUE STICKER  CRITICAL VALUE: INR 6.6  RECEIVER (on-site recipient of call): Rodman Key  DATE & TIME NOTIFIED: 12/11 2229  MESSENGER (representative from lab):  MD NOTIFIED: Rath[place] MD  TIME OF NOTIFICATION: 0610  RESPONSE: see new orders and MD note

## 2020-11-05 NOTE — Progress Notes (Signed)
Overnight floor coverage  Patient is on chronic anticoagulation with Coumadin for atrial fibrillation and INR has been supratherapeutic on labs despite Coumadin being held for the past 3 days.  INR 3.2 > 4.8 > now 6.6 on labs this morning.  No active bleeding. -Oral vitamin K 2.5 mg ordered -Continue to hold Coumadin and monitor PT/INR, pharmacy following

## 2020-11-06 DIAGNOSIS — I482 Chronic atrial fibrillation, unspecified: Secondary | ICD-10-CM

## 2020-11-06 LAB — CBC
HCT: 34.7 % — ABNORMAL LOW (ref 36.0–46.0)
Hemoglobin: 11.8 g/dL — ABNORMAL LOW (ref 12.0–15.0)
MCH: 32.9 pg (ref 26.0–34.0)
MCHC: 34 g/dL (ref 30.0–36.0)
MCV: 96.7 fL (ref 80.0–100.0)
Platelets: 206 10*3/uL (ref 150–400)
RBC: 3.59 MIL/uL — ABNORMAL LOW (ref 3.87–5.11)
RDW: 12.7 % (ref 11.5–15.5)
WBC: 8.3 10*3/uL (ref 4.0–10.5)
nRBC: 0.2 % (ref 0.0–0.2)

## 2020-11-06 LAB — BASIC METABOLIC PANEL
Anion gap: 10 (ref 5–15)
BUN: 42 mg/dL — ABNORMAL HIGH (ref 8–23)
CO2: 20 mmol/L — ABNORMAL LOW (ref 22–32)
Calcium: 8.5 mg/dL — ABNORMAL LOW (ref 8.9–10.3)
Chloride: 99 mmol/L (ref 98–111)
Creatinine, Ser: 1.17 mg/dL — ABNORMAL HIGH (ref 0.44–1.00)
GFR, Estimated: 45 mL/min — ABNORMAL LOW (ref >60)
Glucose, Bld: 99 mg/dL (ref 70–99)
Potassium: 4.1 mmol/L (ref 3.5–5.1)
Sodium: 129 mmol/L — ABNORMAL LOW (ref 135–145)

## 2020-11-06 LAB — PROTIME-INR
INR: 2.8 — ABNORMAL HIGH (ref 0.8–1.2)
Prothrombin Time: 29 seconds — ABNORMAL HIGH (ref 11.4–15.2)

## 2020-11-06 MED ORDER — SODIUM CHLORIDE 0.9 % IV SOLN: INTRAVENOUS | Status: DC

## 2020-11-06 MED ORDER — ACETAMINOPHEN 500 MG PO TABS
1000.0000 mg | ORAL_TABLET | Freq: Four times a day (QID) | ORAL | Status: DC | PRN
  Administered 2020-11-06 – 2020-11-08 (×3): 1000 mg via ORAL
  Filled 2020-11-06 (×4): qty 2

## 2020-11-06 MED ORDER — BISACODYL 5 MG PO TBEC: 5.0000 mg | DELAYED_RELEASE_TABLET | Freq: Every day | ORAL | Status: DC | PRN

## 2020-11-06 MED ORDER — PNEUMOCOCCAL VAC POLYVALENT 25 MCG/0.5ML IJ INJ: 0.5000 mL | INJECTION | INTRAMUSCULAR | Status: DC

## 2020-11-06 MED ORDER — WARFARIN SODIUM 2 MG PO TABS
2.0000 mg | ORAL_TABLET | Freq: Once | ORAL | Status: AC
  Administered 2020-11-06: 18:00:00 2 mg via ORAL
  Filled 2020-11-06: qty 1

## 2020-11-06 MED ORDER — CARVEDILOL 3.125 MG PO TABS
3.1250 mg | ORAL_TABLET | Freq: Two times a day (BID) | ORAL | Status: DC
  Administered 2020-11-06 – 2020-11-07 (×3): 3.125 mg via ORAL
  Filled 2020-11-06 (×3): qty 1

## 2020-11-06 MED ORDER — SODIUM BICARBONATE 650 MG PO TABS
650.0000 mg | ORAL_TABLET | Freq: Two times a day (BID) | ORAL | Status: AC
  Administered 2020-11-06 (×2): 650 mg via ORAL
  Filled 2020-11-06 (×2): qty 1

## 2020-11-06 NOTE — Progress Notes (Signed)
Progress Note  Patient Name: [name] Date of Encounter: 11/06/2020  [facility] Cardiologist: Jenkins Rouge, MD    Subjective   Still with nausea , poor appetite and constipation I have known [name] since 1994 and she has gone down hill a lot the last year I suspect she will be palliative/hospice care within a year   Inpatient Medications    Scheduled Meds: . atorvastatin  20 mg Oral Daily  . colchicine  0.6 mg Oral Daily  . feeding supplement  237 mL Oral TID BM  . ketorolac  1 drop Right Eye BID  . levothyroxine  100 mcg Oral Q0600  . multivitamin with minerals  1 tablet Oral Daily  . ondansetron  4 mg Oral Q12H  . pantoprazole  40 mg Oral Daily  . polyethylene glycol powder  1 Container Oral Once  . polyvinyl alcohol  1 drop Both Eyes BID  . vitamin B-12  1,000 mcg Oral Daily  . Warfarin - Pharmacist Dosing Inpatient   Does not apply q1600   Continuous Infusions:  PRN Meds: ALPRAZolam, bisacodyl, fentaNYL (SUBLIMAZE) injection, HYDROcodone-acetaminophen, HYDROmorphone (DILAUDID) injection, ondansetron (ZOFRAN) IV   Vital Signs    Vitals:   11/05/20 2147 11/06/20 0046 11/06/20 0325 11/06/20 0805  BP: (!) 122/99 115/79 128/87 (!) 137/96  Pulse: 91 79 96 (!) 101  Resp: 18 20 16 18   Temp: 97.8 F (36.6 C) 97.6 F (36.4 C) 98 F (36.7 C)   TempSrc: Oral Oral Oral   SpO2: 100% 98% 98% 99%  Weight:   80.7 kg   Height:        Intake/Output Summary (Last 24 hours) at 11/06/2020 3614 Last data filed at 11/05/2020 2148 Gross per 24 hour  Intake 600 ml  Output 901 ml  Net -301 ml   Last 3 Weights 11/06/2020 11/05/2020 11/04/2020  Weight (lbs) 178 lb 170 lb 3.1 oz 173 lb 4.5 oz  Weight (kg) 80.74 kg 77.2 kg 78.6 kg      Telemetry    Atrial fib with controlled V response - Personally Reviewed  ECG    Afib no acute ST changes   Physical Exam   Frail elderly female Lungs clear AS murmur  Abdomen mildly distended No edema  Palpable pedal  pulses   Labs    High Sensitivity Troponin:   Recent Labs  Lab 11/01/20 1649 11/01/20 1849 11/02/20 0041  TROPONINIHS 9 10 13       Chemistry Recent Labs  Lab 11/01/20 1849 11/01/20 2018 11/02/20 0747 11/03/20 0312 11/04/20 0233 11/05/20 0308 11/06/20 0410  NA  --    < > 128*   < > 127* 126* 129*  K  --    < > 5.2*   < > 5.4* 4.6 4.1  CL  --    < > 95*   < > 94* 96* 99  CO2  --   --  19*   < > 22 21* 20*  GLUCOSE  --    < > 117*   < > 139* 140* 99  BUN  --    < > 31*   < > 62* 63* 42*  CREATININE  --    < > 1.10*   < > 1.94* 1.78* 1.17*  CALCIUM  --   --  9.3   < > 8.6* 8.4* 8.5*  PROT 6.9  --  6.7  --   --   --   --   ALBUMIN 3.6  --  3.5  --   --   --   --   AST 22  --  23  --   --   --   --   ALT 16  --  16  --   --   --   --   ALKPHOS 94  --  87  --   --   --   --   BILITOT 3.2*  --  2.9*  --   --   --   --   GFRNONAA  --   --  48*   < > 24* 27* 45*  ANIONGAP  --   --  14   < > 11 9 10    < > = values in this interval not displayed.     Hematology Recent Labs  Lab 11/04/20 0705 11/05/20 0308 11/06/20 0410  WBC 10.0 8.5 8.3  RBC 3.57* 3.30* 3.59*  HGB 11.3* 10.9* 11.8*  HCT 35.5* 31.7* 34.7*  MCV 99.4 96.1 96.7  MCH 31.7 33.0 32.9  MCHC 31.8 34.4 34.0  RDW 12.8 12.8 12.7  PLT 166 181 206    BNP Recent Labs  Lab 11/01/20 1827  BNP 672.0*     DDimer No results for input(s): DDIMER in the last 168 hours.   Radiology    No results found.  Cardiac Studies      Patient Profile     86 y.o. female with severe valvular disease   Assessment & Plan    1.  Pleuritic pain: doubt pericarditis ? Colchicine making GI symptoms worse will stop    2.  Valve Disease:  Severe MS not a valvuloplasty/surgical candidate resume home beta blocker To maximize diastolic filling time She is dry with poor PO intake no need for diuretic   3. AFib:  Chronic she has never wanted to be on DOAC. INR 2.8 today post Vit K consider resuming Coumadin in am    She  is DNR   For questions or updates, please contact [place] Please consult [email or web address] for contact info under        Signed, Jenkins Rouge, MD  11/06/2020, 8:22 AM

## 2020-11-06 NOTE — Progress Notes (Signed)
ANTICOAGULATION CONSULT NOTE - Follow Up Consult  Pharmacy Consult for Coumadin Indication: atrial fibrillation  Allergies  Allergen Reactions  . Codeine Nausea Only    Patient Measurements: Height: 5\' 11"  (180.3 cm) Weight: 80.7 kg (178 lb) IBW/kg (Calculated) : 70.8  Vital Signs: Temp: 98 F (36.7 C) (12/12 0325) Temp Source: Oral (12/12 0325) BP: 137/96 (12/12 0805) Pulse Rate: 101 (12/12 0805)  Labs: Recent Labs    11/04/20 0233 11/04/20 0705 11/04/20 0705 11/05/20 0308 11/06/20 0410  HGB  --  11.3*   < > 10.9* 11.8*  HCT  --  35.5*  --  31.7* 34.7*  PLT  --  166  --  181 206  LABPROT 43.2*  --   --  55.9* 29.0*  INR 4.8*  --   --  6.6* 2.8*  CREATININE 1.94*  --   --  1.78* 1.17*   < > = values in this interval not displayed.    Estimated Creatinine Clearance: 36.4 mL/min (A) (by C-G formula based on SCr of 1.17 mg/dL (H)).   Medical History: Past Medical History:  Diagnosis Date  . Anxiety   . Atrial fibrillation ([place])   . Cancer ([place])   . Cataract    REMOVED BILATERAL  . Chest pain, unspecified   . Chronic low back pain   . Diverticulosis of colon   . History of colon cancer   . History of colonic polyps   . History of colonoscopy   . History of uterine cancer   . Hyperlipidemia   . Hypertension   . Hypothyroid   . Impaired glucose tolerance   . Mitral valve disorder   . Osteoarthritis   . Osteoporosis   . Patent foramen ovale   . Peripheral neuropathy   . Peripheral vascular disease ([place])   . Splenic infarction   . UTI (lower urinary tract infection)   . Venous insufficiency   . Vitamin B12 deficiency    Assessment: 86 yo female presented on 11/01/2020 with chest pain found to be secondary to moderate pericardial effusion/pericarditis. Patient is on warfarin prior to admission for Afib. INR was 2.8 on admission but increased to 6.6 continuing home regimen. H&H, Plt stable. Per RN no reported bleeding. Patient's diet initially improved  but patient is now refusing to eat. Ceftriaxone has been stopped and vitamin K 2.5mg  by mouth was given 12/11 AM. INR is now therapeutic at 2.8. Vitamin K effects will most likely be prolonged.  Prior to admission warfarin regimen: 5mg  warfarin daily   Goal of Therapy:  INR 2-3 Monitor platelets by anticoagulation protocol: Yes   Plan:  Resume warfarin 2mg  x1 Monitor daily INR, CBC and S/S of bleeding  CMET to check for elevated LFTs  Romilda Garret, PharmD PGY1 Acute Care Pharmacy Resident Phone: [phone] 11/06/2020 8:41 AM  Please check [email or web address] for unit specific pharmacy phone numbers.

## 2020-11-06 NOTE — Progress Notes (Signed)
Patient alert and oriented x 4 this morning but looks more fatigued and unwell than yesterday. She was able to get up and have a significant BM, which she stated made her feel better a few days ago; however, while nausea has subsided somewhat this am, she still looks as though she does not feel better. Lungs remain clear to auscultation throughout, resps even and unlabored. She continues to require 2L oxygen via [place], especially with activity. She can tolerate a short time without it, but her lips do turn purple if without it for over 8-59minutes. Bowel sounds are on the slower end of normoactive throughout; patient continues to have little appetite and while she tries to eat when she doesn't fell like it, this usually ends up with emesis. Patient vascillates between using purewick and bedside commode with transfer assist x 1. She is also exhibiting slight nonpitting edema in her arms and legs this morning; will monitor this. She was restarted on her coreg this morning and INR has normalized, so bleeding concerns have decreased at this time. No other needs at this time, denies pain. PIV to left [facility] continues to infuse NS @ 75/hr with no s/s of erythema, induration, warmth, infiltration or phlebitis. Call light in reach, will monitor.

## 2020-11-06 NOTE — Progress Notes (Signed)
PROGRESS NOTE    [name]  UEA:[id number] DOB: [birth year] DOA: 11/01/2020 PCP: Laurey Morale, MD   Brief Narrative:  HPI on 11/01/2020 by Dr. Gean Birchwood [name] is a 86 y.o. female with history of A. fib, mitral stenosis, chronic kidney disease stage III, chronic hyponatremia, history of colon cancer, hypothyroidism, anemia presents to the ER because of chest pain.  Patient has been having chest pain for the last 1 week mostly in the center of the chest radiating to the left arm.  Patient did have some dysuria over the last few days and had gone to the urgent care center on Saturday about 4 days ago and was given antibiotics which patient has been taking for last 3 days.  Her dysuria has improved.  But due to worsening chest pain which is pleuritic in nature radiating to her left shoulder patient presents to the ER.  Denies any nausea vomiting diarrhea has some shortness of breath.  Felt weak.  Patient received her COVID-19 booster shot vaccination a month ago exactly.  Interim history Admitted with chest pain likely secondary to pericardial effusion/pericarditis.  Started on colchicine by cardiology. Course complicated by AKI and supratherapeutic INR.  Assessment & Plan   Chest pain likely secondary to moderate pericardial effusion/pericarditis/ Pleuritic pain -CT chest no evidence of aortic dissection or aneurysm.  Cardiomegaly with severe biatrial enlargement, moderate sized pericardial effusion. -Cardiology consulted and appreciated -Currently no signs of tamponade -Echocardiogram showed EF of 55 to 60%.  No regional wall motion abnormalities.  Moderate left LVH.  LV diastolic parameters indeterminate.  RV systolic function mildly reduced.  Mitral valve degenerative, mild to moderate MV regurgitation, severe mitral stenosis.  Moderate to severe tricuspid valve regurgitation.  Severe calcification of aortic valve with trivial regurgitation and mild to moderate aortic valve  stenosis. -Cardiology initially added colchicine. Discussed with Dr. Johnsie Cancel, colchicine discontinued today (as this could be contributing to her nausea and poor appetite however this has been ongoing for >1year) -Continue pain control with IV Dilaudid along with hydrocodone.  -Continue Xanax for anxiety  Severe mitral stenosis/elevated BNP- Acute diastolic CHF  -CT scan shows findings suggestive of heart failure developing pulmonary edema -BNP 672 -Last echocardiogram Mar 30, 2020 shows an EF of 60 to 65%.  LV diastolic function could not be evaluated. -Patient was given 1 dose of IV Lasix -Blood pressures have improved -Cardiology consulted and appreciated -Echocardiogram as above  Acute kidney injury on Chronic kidney disease, stage IIIa with hyperkalemia -As above, patient did receive 1 dose of IV Lasix in the emergency department -Potassium remains elevated at 5.4 (despite Lokelma, potassium continues to be elevated, will reorder 2 doses of Lokelma) -Creatinine peaked to 1.94 (baseline 1.1)- now down to 1.17 -Renal US: Turning of the renal parenchyma bilaterally, can be seen in medical renal disease.  No acute abnormality or obstruction -Urine electrolytes as well as repeat UA and culture ordered -discontinued IVF  Chronic hyponatremia -Sodium currently 129 (baseline approximately 130-134) -Patient asymptomatic -Continue to monitor BMP  Chronic anemia -Hemoglobin stable, currently 11.8 -Continue to monitor CBC  Atrial fibrillation, with supratherapeutic INR -Continue Coumadin per pharmacy -Currently patient is tachycardic in the low 100s (106)-pain may also be contributing to this -INR peaked to 6.6, however down to 2.8 today (was given oral vitamin K)  Essential hypertension -BP stable -Antihypertensive medications have been held due to soft BPs  UTI/asymptomatic bacteriuria -Patient was recently treated for urinary tract infection however currently is  asymptomatic -Patient was treated with Keflex as an outpatient -Urine culture returned 10,000 colonies of Pseudomonas aeruginosa  -Will discontinue ceftriaxone as this would not cover Pseudomonas -Repeat UA unremarkable for infection  Hypothyroidism -Continue Synthroid  Thrombocytopenia -Resolved, continue to monitor CBC  Lung opacity -Noted on CT chest, groundglass airspace opacity left upper lobe measuring 6 mm -May be secondary to pulmonary edema. -Follow-up recommendations for repeat CT in 6 to 12 months  Mildly elevated Lactic acid -likely secondary to the above -no infectious etiology   Nausea -Has been ongoing for over a year -Started patient on PPI -Continue antiemetics as needed as well as 2 doses scheduled -Discussed with niece via phone, nausea seems to worsen with movement -Orthostatics were unremarkable -Patient also tells me that she has had a history of vertigo however denies any current dizziness -Continue to monitor  Moderate malnutrition -In the context of chronic illness -Patient tells me she has had poor appetite since having Covid over 1 year ago -Nutrition consulted -Continue supplements  -Patient may consider discussing with her PCP use of appetite stimulants although this will take several weeks to see any results  Constipation -Resolved with enema -Suspect multifactorial including poor oral intake, pain medication -Continue Dulcolax as needed  DVT Prophylaxis Coumadin  Code Status: DNR  Family Communication: None at bedside. Niece via phone on 11/05/2020  Disposition Plan:  Status is: Inpatient  Remains inpatient appropriate because:Ongoing active pain requiring inpatient pain management, Ongoing diagnostic testing needed not appropriate for outpatient work up, IV treatments appropriate due to intensity of illness or inability to take PO and Inpatient level of care appropriate due to severity of illness   Dispo: The patient is from:  Home              Anticipated d/c is to: Home              Anticipated d/c date is: 3 days              Patient currently is not medically stable to d/c.  Consultants Cardiology  Procedures  Echocardiogram  Antibiotics   Anti-infectives (From admission, onward)   Start     Dose/Rate Route Frequency Ordered Stop   11/02/20 0500  cefTRIAXone (ROCEPHIN) 1 g in sodium chloride 0.9 % 100 mL IVPB  Status:  Discontinued        1 g 200 mL/hr over 30 Minutes Intravenous Daily 11/02/20 0446 11/04/20 0827      Subjective:   Derek [place] seen and examined today.  Patient feels pain has mildly improved.  Continues to have nausea but feels that the Zofran has been helping.  Denies current shortness of breath.  Was able to have a bowel movement after receiving an enema.  Denies current dizziness or headache.    Objective:   Vitals:   11/05/20 2147 11/06/20 0046 11/06/20 0325 11/06/20 0805  BP: (!) 122/99 115/79 128/87 (!) 137/96  Pulse: 91 79 96 (!) 101  Resp: 18 20 16 18   Temp: 97.8 F (36.6 C) 97.6 F (36.4 C) 98 F (36.7 C)   TempSrc: Oral Oral Oral   SpO2: 100% 98% 98% 99%  Weight:   80.7 kg   Height:        Intake/Output Summary (Last 24 hours) at 11/06/2020 1115 Last data filed at 11/05/2020 2148 Gross per 24 hour  Intake 360 ml  Output 400 ml  Net -40 ml   Filed Weights   11/04/20 0001 11/05/20 0007 11/06/20 0325  Weight: 78.6 kg 77.2 kg 80.7 kg   Exam  General: Well developed, chronically ill-appearing, thin, NAD  HEENT: NCAT,  mucous membranes moist.   Cardiovascular: S1 S2 auscultated, irregular, 2/6 SEM  Respiratory: Clear to auscultation bilaterally   Abdomen: Soft, nontender, nondistended, + bowel sounds  Extremities: warm dry without cyanosis clubbing or edema  Neuro: AAOx3, nonfocal  Psych: pleasant, appropriate mood and affect  Data Reviewed: I have personally reviewed following labs and imaging studies  CBC: Recent Labs  Lab 11/02/20 0747  11/03/20 0312 11/04/20 0705 11/05/20 0308 11/06/20 0410  WBC 16.9* 11.8* 10.0 8.5 8.3  NEUTROABS 13.6*  --   --   --   --   HGB 11.8* 11.3* 11.3* 10.9* 11.8*  HCT 36.3 33.3* 35.5* 31.7* 34.7*  MCV 100.3* 97.1 99.4 96.1 96.7  PLT 160 161 166 181 962   Basic Metabolic Panel: Recent Labs  Lab 11/02/20 0747 11/03/20 0312 11/04/20 0233 11/05/20 0308 11/06/20 0410  NA 128* 126* 127* 126* 129*  K 5.2* 5.5* 5.4* 4.6 4.1  CL 95* 94* 94* 96* 99  CO2 19* 22 22 21* 20*  GLUCOSE 117* 125* 139* 140* 99  BUN 31* 47* 62* 63* 42*  CREATININE 1.10* 1.63* 1.94* 1.78* 1.17*  CALCIUM 9.3 9.3 8.6* 8.4* 8.5*   GFR: Estimated Creatinine Clearance: 36.4 mL/min (A) (by C-G formula based on SCr of 1.17 mg/dL (H)). Liver Function Tests: Recent Labs  Lab 11/01/20 1849 11/02/20 0747  AST 22 23  ALT 16 16  ALKPHOS 94 87  BILITOT 3.2* 2.9*  PROT 6.9 6.7  ALBUMIN 3.6 3.5   Recent Labs  Lab 11/01/20 1849  LIPASE 25   No results for input(s): AMMONIA in the last 168 hours. Coagulation Profile: Recent Labs  Lab 11/01/20 1827 11/03/20 0312 11/04/20 0233 11/05/20 0308 11/06/20 0410  INR 2.8* 3.2* 4.8* 6.6* 2.8*   Cardiac Enzymes: No results for input(s): CKTOTAL, CKMB, CKMBINDEX, TROPONINI in the last 168 hours. BNP (last 3 results) No results for input(s): PROBNP in the last 8760 hours. HbA1C: No results for input(s): HGBA1C in the last 72 hours. CBG: No results for input(s): GLUCAP in the last 168 hours. Lipid Profile: No results for input(s): CHOL, HDL, LDLCALC, TRIG, CHOLHDL, LDLDIRECT in the last 72 hours. Thyroid Function Tests: No results for input(s): TSH, T4TOTAL, FREET4, T3FREE, THYROIDAB in the last 72 hours. Anemia Panel: No results for input(s): VITAMINB12, FOLATE, FERRITIN, TIBC, IRON, RETICCTPCT in the last 72 hours. Urine analysis:    Component Value Date/Time   COLORURINE AMBER (A) 11/04/2020 1727   APPEARANCEUR HAZY (A) 11/04/2020 1727   LABSPEC 1.017  11/04/2020 1727   PHURINE 5.0 11/04/2020 1727   GLUCOSEU NEGATIVE 11/04/2020 1727   GLUCOSEU NEGATIVE 07/15/2008 1258   HGBUR NEGATIVE 11/04/2020 1727   BILIRUBINUR NEGATIVE 11/04/2020 1727   BILIRUBINUR negative 05/25/2020 1430   KETONESUR NEGATIVE 11/04/2020 1727   PROTEINUR NEGATIVE 11/04/2020 1727   UROBILINOGEN 0.2 05/25/2020 1430   UROBILINOGEN 0.2 09/12/2014 1810   NITRITE NEGATIVE 11/04/2020 1727   LEUKOCYTESUR NEGATIVE 11/04/2020 1727   Sepsis Labs: @LABRCNTIP (procalcitonin:4,lacticidven:4)  ) Recent Results (from the past 240 hour(s))  Resp Panel by RT-PCR (Flu A&B, Covid) Nasopharyngeal Swab     Status: None   Collection Time: 11/01/20  7:58 PM   Specimen: Nasopharyngeal Swab; Nasopharyngeal(NP) swabs in vial transport medium  Result Value Ref Range Status   SARS Coronavirus 2 by RT PCR NEGATIVE NEGATIVE Final    Comment: (NOTE) SARS-CoV-2  target nucleic acids are NOT DETECTED.  The SARS-CoV-2 RNA is generally detectable in upper respiratory specimens during the acute phase of infection. The lowest concentration of SARS-CoV-2 viral copies this assay can detect is 138 copies/mL. A negative result does not preclude SARS-Cov-2 infection and should not be used as the sole basis for treatment or other patient management decisions. A negative result may occur with  improper specimen collection/handling, submission of specimen other than nasopharyngeal swab, presence of viral mutation(s) within the areas targeted by this assay, and inadequate number of viral copies(<138 copies/mL). A negative result must be combined with clinical observations, patient history, and epidemiological information. The expected result is Negative.  Fact Sheet for Patients:  EntrepreneurPulse.com.au  Fact Sheet for Healthcare Providers:  IncredibleEmployment.be  This test is no t yet approved or cleared by the [place] FDA and  has been authorized  for detection and/or diagnosis of SARS-CoV-2 by FDA under an Emergency Use Authorization (EUA). This EUA will remain  in effect (meaning this test can be used) for the duration of the COVID-19 declaration under Section 564(b)(1) of the Act, 21 U.S.C.section 360bbb-3(b)(1), unless the authorization is terminated  or revoked sooner.       Influenza A by PCR NEGATIVE NEGATIVE Final   Influenza B by PCR NEGATIVE NEGATIVE Final    Comment: (NOTE) The Xpert Xpress SARS-CoV-2/FLU/RSV plus assay is intended as an aid in the diagnosis of influenza from Nasopharyngeal swab specimens and should not be used as a sole basis for treatment. Nasal washings and aspirates are unacceptable for Xpert Xpress SARS-CoV-2/FLU/RSV testing.  Fact Sheet for Patients: EntrepreneurPulse.com.au  Fact Sheet for Healthcare Providers: IncredibleEmployment.be  This test is not yet approved or cleared by the [place] FDA and has been authorized for detection and/or diagnosis of SARS-CoV-2 by FDA under an Emergency Use Authorization (EUA). This EUA will remain in effect (meaning this test can be used) for the duration of the COVID-19 declaration under Section 564(b)(1) of the Act, 21 U.S.C. section 360bbb-3(b)(1), unless the authorization is terminated or revoked.  Performed at [facility], [place] [address], [place], [place] [address]   Urine Culture     Status: Abnormal   Collection Time: 11/02/20  6:10 AM   Specimen: Urine, Random  Result Value Ref Range Status   Specimen Description URINE, RANDOM  Final   Special Requests   Final    NONE Performed at [facility], [place] [address], [place], [place] [address]    Culture 10,000 COLONIES/mL PSEUDOMONAS AERUGINOSA (A)  Final   Report Status 11/04/2020 FINAL  Final   Organism ID, Bacteria PSEUDOMONAS AERUGINOSA (A)  Final      Susceptibility   Pseudomonas aeruginosa - MIC*    CEFTAZIDIME <=1 SENSITIVE  Sensitive     CIPROFLOXACIN 0.5 SENSITIVE Sensitive     GENTAMICIN <=1 SENSITIVE Sensitive     IMIPENEM 2 SENSITIVE Sensitive     PIP/TAZO <=4 SENSITIVE Sensitive     CEFEPIME 0.5 SENSITIVE Sensitive     * 10,000 COLONIES/mL PSEUDOMONAS AERUGINOSA      Radiology Studies: No results found.   Scheduled Meds: . atorvastatin  20 mg Oral Daily  . carvedilol  3.125 mg Oral BID WC  . feeding supplement  237 mL Oral TID BM  . ketorolac  1 drop Right Eye BID  . levothyroxine  100 mcg Oral Q0600  . multivitamin with minerals  1 tablet Oral Daily  . ondansetron  4 mg Oral  Q12H  . pantoprazole  40 mg Oral Daily  . polyethylene glycol powder  1 Container Oral Once  . polyvinyl alcohol  1 drop Both Eyes BID  . vitamin B-12  1,000 mcg Oral Daily  . warfarin  2 mg Oral ONCE-1600  . Warfarin - Pharmacist Dosing Inpatient   Does not apply q1600   Continuous Infusions:    LOS: 5 days   Time Spent in minutes   45 minutes  Terek Bee D.O. on 11/06/2020 at 11:15 AM  Between 7am to 7pm - Please see pager noted on [email or web address]  After 7pm go to [email or web address]  And look for the night coverage person covering for me after hours  [facility] Office  [phone]

## 2020-11-06 NOTE — Plan of Care (Signed)
  Problem: Education: Goal: Ability to demonstrate management of disease process will improve Outcome: Progressing Goal: Ability to verbalize understanding of medication therapies will improve Outcome: Progressing   Problem: Cardiac: Goal: Ability to achieve and maintain adequate cardiopulmonary perfusion will improve Outcome: Progressing   Problem: Education: Goal: Knowledge of General Education information will improve Description: Including pain rating scale, medication(s)/side effects and non-pharmacologic comfort measures Outcome: Progressing   Problem: Health Behavior/Discharge Planning: Goal: Ability to manage health-related needs will improve Outcome: Progressing   Problem: Clinical Measurements: Goal: Ability to maintain clinical measurements within normal limits will improve Outcome: Progressing Goal: Will remain free from infection Outcome: Progressing Goal: Diagnostic test results will improve Outcome: Progressing Goal: Cardiovascular complication will be avoided Outcome: Progressing   Problem: Coping: Goal: Level of anxiety will decrease Outcome: Progressing   Problem: Elimination: Goal: Will not experience complications related to bowel motility Outcome: Progressing   Problem: Safety: Goal: Ability to remain free from injury will improve Outcome: Progressing

## 2020-11-07 DIAGNOSIS — I309 Acute pericarditis, unspecified: Principal | ICD-10-CM

## 2020-11-07 DIAGNOSIS — R112 Nausea with vomiting, unspecified: Secondary | ICD-10-CM

## 2020-11-07 DIAGNOSIS — E440 Moderate protein-calorie malnutrition: Secondary | ICD-10-CM

## 2020-11-07 LAB — CBC
HCT: 32.5 % — ABNORMAL LOW (ref 36.0–46.0)
Hemoglobin: 10.9 g/dL — ABNORMAL LOW (ref 12.0–15.0)
MCH: 32.6 pg (ref 26.0–34.0)
MCHC: 33.5 g/dL (ref 30.0–36.0)
MCV: 97.3 fL (ref 80.0–100.0)
Platelets: 215 10*3/uL (ref 150–400)
RBC: 3.34 MIL/uL — ABNORMAL LOW (ref 3.87–5.11)
RDW: 13 % (ref 11.5–15.5)
WBC: 8.6 10*3/uL (ref 4.0–10.5)
nRBC: 0 % (ref 0.0–0.2)

## 2020-11-07 LAB — COMPREHENSIVE METABOLIC PANEL
ALT: 73 U/L — ABNORMAL HIGH (ref 0–44)
AST: 59 U/L — ABNORMAL HIGH (ref 15–41)
Albumin: 2.9 g/dL — ABNORMAL LOW (ref 3.5–5.0)
Alkaline Phosphatase: 122 U/L (ref 38–126)
Anion gap: 7 (ref 5–15)
BUN: 27 mg/dL — ABNORMAL HIGH (ref 8–23)
CO2: 23 mmol/L (ref 22–32)
Calcium: 8.3 mg/dL — ABNORMAL LOW (ref 8.9–10.3)
Chloride: 103 mmol/L (ref 98–111)
Creatinine, Ser: 0.88 mg/dL (ref 0.44–1.00)
GFR, Estimated: >60 mL/min (ref >60)
Glucose, Bld: 125 mg/dL — ABNORMAL HIGH (ref 70–99)
Potassium: 4.2 mmol/L (ref 3.5–5.1)
Sodium: 133 mmol/L — ABNORMAL LOW (ref 135–145)
Total Bilirubin: 1.8 mg/dL — ABNORMAL HIGH (ref 0.3–1.2)
Total Protein: 5.7 g/dL — ABNORMAL LOW (ref 6.5–8.1)

## 2020-11-07 LAB — PROTIME-INR
INR: 2.3 — ABNORMAL HIGH (ref 0.8–1.2)
Prothrombin Time: 24.3 seconds — ABNORMAL HIGH (ref 11.4–15.2)

## 2020-11-07 MED ORDER — HYDROCODONE-ACETAMINOPHEN 5-325 MG PO TABS
1.0000 | ORAL_TABLET | ORAL | Status: DC | PRN
Start: 2020-11-07 — End: 2020-11-09
  Administered 2020-11-07: 1 via ORAL
  Administered 2020-11-07: 2 via ORAL
  Administered 2020-11-07 – 2020-11-09 (×2): 1 via ORAL
  Administered 2020-11-09: 2 via ORAL
  Filled 2020-11-07: qty 1
  Filled 2020-11-07: qty 2
  Filled 2020-11-07: qty 1
  Filled 2020-11-07 (×2): qty 2
  Filled 2020-11-07: qty 1

## 2020-11-07 MED ORDER — COLCHICINE 0.6 MG PO TABS
0.6000 mg | ORAL_TABLET | Freq: Every day | ORAL | Status: DC
  Administered 2020-11-07 – 2020-11-09 (×3): 0.6 mg via ORAL
  Filled 2020-11-07 (×4): qty 1
  Filled 2020-11-07: qty 2

## 2020-11-07 MED ORDER — WARFARIN SODIUM 2 MG PO TABS
4.0000 mg | ORAL_TABLET | Freq: Once | ORAL | Status: AC
  Administered 2020-11-07: 4 mg via ORAL
  Filled 2020-11-07: qty 2

## 2020-11-07 MED ORDER — METOPROLOL SUCCINATE ER 25 MG PO TB24
25.0000 mg | ORAL_TABLET | Freq: Every day | ORAL | Status: DC
  Administered 2020-11-07: 25 mg via ORAL
  Filled 2020-11-07: qty 1

## 2020-11-07 NOTE — H&P (View-Only) (Signed)
[facility] Consult: 9:17 AM 11/07/2020  LOS: 6 days    Referring Provider: Dr Ree Kida.    Primary Care Physician:  Laurey Morale, MD Primary Gastroenterologist:  Dr. Henrene [facility] power of attorney is her niece Mordecai Maes [phone]    Reason for Consultation:  nausea   HPI: [name] is a 86 y.o. female.  PMH Previous surgeries include right hemicolectomy, hysterectomy.  Atrial fibrillation.  Chronic Coumadin.  Severe mitral stenosis, not a candidate for valve replacement or balloon valvuloplasty.  NSTEMI 01/2019, cardiac cath w essentially normal coronaries. Previous pericarditis improved with colchicine.  CKD 3.    08/2015 colonoscopy for polyp/neoplasia surveillance.  Right-sided colon cancer 1994 s/p right hemicolectomy.  Tubular adenoma 04/2010.  Healthy-appearing ileocolonic anastomosis in the right colon.  Sigmoid diverticulosis. 03/2017 PSBO. 08/2019 abdominal ultrasound for elevated LFTs.  No gallstones or gallbladder irregularity.  CBD 4.1 mm normal liver, normal biliary tree.  Portal vein patent on Doppler studies with normal directional flow towards the liver.  Her problems with nausea and infrequent nonbloody emesis begin October 2020.  Nothing in particular seems to trigger the nausea.  She does not vomit very often but the nausea comes in waves.  Nausea may be present for a few days in a row with symptoms daytime and nighttime.  The longest she goes nausea free is 3 or 4 days.  She has been prescribed Zofran which, with as needed use, helps but she has not been prescribed or taken PPI or H2 blocker.  Food may or may not be a trigger.  She is generally anorexic but has not lost significant weight.  Reports weight fluctuation from 166 to 164#.  She wonders if she has long-haul Covid symptoms  since she tested positive for Covid in November 2020, which is a month after the onset of the nausea.  She was never hospitalized but she has lingering fatigue and the nausea.  Reports loose stools which are chronic, occasional constipation since her remote hemicolectomy.  Has never undergone upper endoscopy.  Does not use NSAIDs or aspirin  Admitted 6 days ago with chest pain, pericarditis.  Nausea and then vomiting have worsened with initiation of colchicine.  Therefore the med was stopped over the weekend but cardiology planning to resume this today at a lower dose.  11/01/2020 CT angio chest/abdomen/pelvis.  No aortic aneurysm or dissection.  Cardiomegaly, severe biatrial enlargement, moderate pericardial effusion.  CHF with pulmonary edema.  Groundglass opacity LUL.  Liver enlarged and somewhat nodular suggesting hepatic venous congestion, possibly due to right-sided heart failure.  Spleen enlarged measuring 13 cm craniocaudad.   11/02/2020 2D echo.  LVEF 55 to 60%.  Flattened intervention tubular septum consistent with right ventricular pressure and volume overload.  Moderately reduced right ventricular systolic function.  Severe left atrial dilation and mild right atrial dilation.  Degenerative mitral valve with mild to moderate regurgitation, severe mitral stenosis.  Moderate to severe tricuspid regurgitation.  Tricuspid aortic valve with mild to moderate stenosis.  IVC dilated suggesting right atrial pressure of 15 mmHg  moderate pericardial effusion without tamponade.  Na 126 >> 129.  K5.4 >> 4.1 Hgb 11.8 >> 1.9.  MCV high 90s -100. T bili 2.9>> 1.8.  Alk phos 94  >> 122.  AST/ALT 22/16  >> 59/73.  Albumin 2.9 CRP 10.7/  Lactic acid 2/1 >> 2 INR 6.6 >> 2.3  Trops wnl.  BNP 672.   urine clx growing 10 K Pseudomonas.     Past Medical History:  Diagnosis Date  . Anxiety   . Atrial fibrillation ([place])   . Cancer ([place])   . Cataract    REMOVED BILATERAL  . Chest pain, unspecified   . Chronic  low back pain   . Diverticulosis of colon   . History of colon cancer   . History of colonic polyps   . History of colonoscopy   . History of uterine cancer   . Hyperlipidemia   . Hypertension   . Hypothyroid   . Impaired glucose tolerance   . Mitral valve disorder   . Osteoarthritis   . Osteoporosis   . Patent foramen ovale   . Peripheral neuropathy   . Peripheral vascular disease ([place])   . Splenic infarction   . UTI (lower urinary tract infection)   . Venous insufficiency   . Vitamin B12 deficiency     Past Surgical History:  Procedure Laterality Date  . ANTERIOR AND POSTERIOR VAGINAL REPAIR  03/2006   Dr.Neal  . basal cell skin cancer Moh's surgery    . CATARACT EXTRACTION, BILATERAL    . COLONOSCOPY  05-25-10   per Dr. Henrene Pastor, benign polyps, repeat in 5 yrs   . EYE SURGERY    . hysterectomy for uterine cancer    . left carpal tunnel surgery  02/2007   Dr. Daylene Katayama  . LEFT HEART CATH AND CORONARY ANGIOGRAPHY N/A 02/12/2019   Procedure: LEFT HEART CATH AND CORONARY ANGIOGRAPHY;  Surgeon: Lorretta Harp, MD;  Location: [facility];  Service: Cardiovascular;  Laterality: N/A;  . lumbar laminectomy for spinal stenosis  2006   L3-4  . RETINAL DETACHMENT REPAIR W/ SCLERAL BUCKLE LE Right 03-01-14   per Dr. Zadie Rhine   . right hemicolectomy for colon cancer  1994    Prior to Admission medications   Medication Sig Start Date End Date Taking? Authorizing Provider  acetaminophen (TYLENOL) 500 MG tablet Take 2 tablets (1,000 mg total) by mouth 2 (two) times daily as needed. Patient taking differently: Take 1,000 mg by mouth 2 (two) times daily.  04/30/18  Yes Laurey Morale, MD  ALPRAZolam Duanne Moron) 0.25 MG tablet TAKE 1 TABLET BY MOUTH EVERY DAY AT BEDTIME AS NEEDED Patient taking differently: Take 0.125 mg by mouth at bedtime as needed for anxiety or sleep.  10/31/20  Yes Laurey Morale, MD  atorvastatin (LIPITOR) 20 MG tablet TAKE 1 TABLET BY MOUTH  DAILY Patient taking  differently: Take 20 mg by mouth daily.  06/16/20  Yes Josue Hector, MD  Calcium Carbonate w/Vitamin D (CALTRATE 600+D) 600-400 MG-UNIT TABS Take 1 tablet by mouth daily.    Yes [provider]  carvedilol (COREG) 12.5 MG tablet Take 1 tablet (12.5 mg total) by mouth 2 (two) times daily with a meal. 09/15/20  Yes Josue Hector, MD  cetirizine (ZYRTEC) 10 MG tablet Take 1 tablet (10 mg total) by mouth daily. Patient taking differently: Take 10 mg by mouth daily as needed for allergies.  10/30/18  Yes Hoyt Koch, MD  CLIMARA 0.1  MG/24HR patch Place 0.05 mg onto the skin once a week.  03/23/19  Yes [provider]  furosemide (LASIX) 40 MG tablet Take 1 tablet (40 mg total) by mouth daily. 04/11/20  Yes Josue Hector, MD  ketorolac (ACULAR) 0.5 % ophthalmic solution PLACE 1 DROP INTO THE RIGHT EYE TWICE A DAY FOR 30 DAYS Patient taking differently: Place 1 drop into the right eye 2 (two) times daily.  07/18/20  Yes Rankin, Clent Demark, MD  meclizine (ANTIVERT) 25 MG tablet Take 1 tablet (25 mg total) by mouth every 4 (four) hours as needed for dizziness. 12/08/19  Yes Laurey Morale, MD  Multiple Vitamins-Minerals (CENTRUM SILVER PO) Take 1 tablet by mouth daily.     Yes [provider]  nitroGLYCERIN (NITROSTAT) 0.4 MG SL tablet PLACE 1 TABLET UNDER THE TONGUE EVERY 5 MINUTES AS NEEDED FOR CHEST PAIN Patient taking differently: Place 0.4 mg under the tongue every 5 (five) minutes as needed for chest pain (max 3 doses).  06/17/20  Yes Laurey Morale, MD  ondansetron (ZOFRAN) 8 MG tablet TAKE 1 TABLET (8 MG TOTAL) BY MOUTH EVERY 6 (SIX) HOURS AS NEEDED FOR NAUSEA OR VOMITING. 10/31/20  Yes Laurey Morale, MD  phenazopyridine (PYRIDIUM) 200 MG tablet Take 1 tablet (200 mg total) by mouth 3 (three) times daily as needed (urinary burning). Patient taking differently: Take 200 mg by mouth 3 (three) times daily as needed (for urinary burning).  09/03/19  Yes Laurey Morale, MD   Polyethyl Glycol-Propyl Glycol (SYSTANE FREE OP) Place 1 drop into the left eye 2 (two) times daily.   Yes [provider]  potassium chloride SA (KLOR-CON) 20 MEQ tablet Take 1 tablet (20 mEq total) by mouth every other day. Patient taking differently: Take 20 mEq by mouth daily.  04/11/20  Yes Josue Hector, MD  quinapril (ACCUPRIL) 10 MG tablet Take 1 tablet (10 mg total) by mouth daily. 09/15/20  Yes Josue Hector, MD  spironolactone (ALDACTONE) 25 MG tablet TAKE 1 TABLET BY MOUTH  DAILY Patient taking differently: Take 25 mg by mouth daily.  06/16/20  Yes Josue Hector, MD  SYNTHROID 100 MCG tablet TAKE 1 TABLET BY MOUTH  DAILY BEFORE BREAKFAST Patient taking differently: Take 100 mcg by mouth daily before breakfast.  06/06/20  Yes Laurey Morale, MD  vitamin B-12 (CYANOCOBALAMIN) 1000 MCG tablet Take 1,000 mcg by mouth daily.     Yes [provider]  warfarin (COUMADIN) 5 MG tablet TAKE AS DIRECTED BY  [place] Patient taking differently: Take 5 mg by mouth daily.  10/26/20  Yes Josue Hector, MD    Scheduled Meds: . colchicine  0.6 mg Oral Daily  . feeding supplement  237 mL Oral TID BM  . ketorolac  1 drop Right Eye BID  . levothyroxine  100 mcg Oral Q0600  . metoprolol succinate  25 mg Oral Daily  . multivitamin with minerals  1 tablet Oral Daily  . ondansetron  4 mg Oral Q12H  . pantoprazole  40 mg Oral Daily  . pneumococcal 23 valent vaccine  0.5 mL Intramuscular Tomorrow-1000  . polyethylene glycol powder  1 Container Oral Once  . polyvinyl alcohol  1 drop Both Eyes BID  . vitamin B-12  1,000 mcg Oral Daily  . warfarin  4 mg Oral ONCE-1600  . Warfarin - Pharmacist Dosing Inpatient   Does not apply q1600   Infusions: . sodium chloride 75 mL/hr at  11/07/20 0347   PRN Meds: acetaminophen, ALPRAZolam, bisacodyl, HYDROcodone-acetaminophen, HYDROmorphone (DILAUDID) injection, ondansetron (ZOFRAN) IV   Allergies as of 11/01/2020 - Review  Complete 11/01/2020  Allergen Reaction Noted  . Codeine Nausea Only     Family History  Problem Relation Age of Onset  . Cancer Mother        deceased age 77  . Heart failure Father        deceased age 30  . Cancer Brother        deceased age 42  . Stroke Sister        and heart problems; deceased age 54  . Heart disease Sister     Social History   Socioeconomic History  . Marital status: Widowed    Spouse name: Not on file  . Number of children: 0  . Years of education: Not on file  . Highest education level: Not on file  Occupational History  . Occupation: retired    Comment: part time at [employer]  . Smoking status: Never Smoker  . Smokeless tobacco: Never Used  Substance and Sexual Activity  . Alcohol use: Yes    Alcohol/week: 0.0 standard drinks    Comment: occ  . Drug use: No  . Sexual activity: Never  Other Topics Concern  . Not on file  Social History Narrative  . Not on file   Social Determinants of Health   Financial Resource Strain: Not on file  Food Insecurity: Not on file  Transportation Needs: Not on file  Physical Activity: Not on file  Stress: Not on file  Social Connections: Not on file  Intimate Partner Violence: Not on file    REVIEW OF SYSTEMS: Constitutional: Fatigue ENT:  No nose bleeds Pulm: No significant shortness of breath or cough. CV:  No palpitations, no LE edema.  Chest pain GU:  No hematuria, no frequency GI: See HPI Heme: Excessive bleeding or bruising Transfusions: None Neuro:  No headaches, no peripheral tingling or numbness.  No syncope, no seizures Derm:  No itching, no rash or sores.  Endocrine:  No sweats or chills.  No polyuria or dysuria Immunization: Has received COVID-19 vaccination plus booster. Travel:  None beyond local counties in last few months.    PHYSICAL EXAM: Vital signs in last 24 hours: Vitals:   11/07/20 0515 11/07/20 0821  BP: 126/87 128/88  Pulse: 91 100  Resp: 20 16  Temp:  97.8 F (36.6 C) (!) 97.5 F (36.4 C)  SpO2: 95% 99%   Wt Readings from Last 3 Encounters:  11/07/20 82.1 kg  09/15/20 77.6 kg  05/25/20 78.4 kg    General: Patient actually looks fairly well, comfortable. Head: No facial asymmetry or swelling.  No signs of head trauma. Eyes: No conjunctival pallor or scleral icterus.  EOMI Ears: Not hard of hearing Nose: Gesturing or discharge Mouth: Oropharynx moist, pink, clear.  Good dentition.  Tongue midline Neck: No JVD, no thyromegaly, no masses Lungs: Lungs clear bilaterally.  No labored breathing or cough Heart: Irregularly irregular rhythm.  No tachycardia..  3/6 systolic murmur Abdomen: Soft without tenderness.  Bowel sounds active.  No HSM, masses, bruits, hernias.   Rectal: Deferred Musc/Skeltl: No joint redness, swelling or significant deformity. Extremities: No CCE. Neurologic: Oriented x3.  Moves all 4 limbs, strength not tested.  No tremors or gross deficits. Skin: Hyperpigmentation to the skin in the lower legs bilaterally Nodes: Cervical adenopathy Psych:, Pleasant, cooperative.  Affect normal  Intake/Output from previous  day: 12/12 0701 - 12/13 0700 In: 240 [P.O.:240] Out: 750 [Urine:750] Intake/Output this shift: Total I/O In: 400 [P.O.:400] Out: -   LAB RESULTS: Recent Labs    11/05/20 0308 11/06/20 0410 11/07/20 0653  WBC 8.5 8.3 8.6  HGB 10.9* 11.8* 10.9*  HCT 31.7* 34.7* 32.5*  PLT 181 206 215   BMET Lab Results  Component Value Date   NA 133 (L) 11/07/2020   NA 129 (L) 11/06/2020   NA 126 (L) 11/05/2020   K 4.2 11/07/2020   K 4.1 11/06/2020   K 4.6 11/05/2020   CL 103 11/07/2020   CL 99 11/06/2020   CL 96 (L) 11/05/2020   CO2 23 11/07/2020   CO2 20 (L) 11/06/2020   CO2 21 (L) 11/05/2020   GLUCOSE 125 (H) 11/07/2020   GLUCOSE 99 11/06/2020   GLUCOSE 140 (H) 11/05/2020   BUN 27 (H) 11/07/2020   BUN 42 (H) 11/06/2020   BUN 63 (H) 11/05/2020   CREATININE 0.88 11/07/2020   CREATININE 1.17  (H) 11/06/2020   CREATININE 1.78 (H) 11/05/2020   CALCIUM 8.3 (L) 11/07/2020   CALCIUM 8.5 (L) 11/06/2020   CALCIUM 8.4 (L) 11/05/2020   LFT Recent Labs    11/07/20 0653  PROT 5.7*  ALBUMIN 2.9*  AST 59*  ALT 73*  ALKPHOS 122  BILITOT 1.8*   PT/INR Lab Results  Component Value Date   INR 2.3 (H) 11/07/2020   INR 2.8 (H) 11/06/2020   INR 6.6 (HH) 11/05/2020   PROTIME 18.1 04/22/2009   Hepatitis Panel No results for input(s): HEPBSAG, HCVAB, HEPAIGM, HEPBIGM in the last 72 hours. C-Diff No components found for: CDIFF Lipase     Component Value Date/Time   LIPASE 25 11/01/2020 1849    Drugs of Abuse  No results found for: LABOPIA, COCAINSCRNUR, LABBENZ, AMPHETMU, THCU, LABBARB   RADIOLOGY STUDIES: No results found.   IMPRESSION:   *     Chronic intermittent but fairly frequent nausea, limited nonbloody emesis, anorexia. ?  GERD, ?  PUD. ?  Related to right-sided heart failure Symptoms acutely worse with the initiation of colchicine, briefly held but now to be restarted at lower doses.  *    ?  Cirrhosis of the liver and hepatic venous congestion from right-sided heart failure per CT scan  *    Chest pain, pericarditis.  Colchicine resumed, had been helpful in the past.  Cardiology tweaking her meds.  *    Permanent A. fib, chronic Coumadin.  INR was up to 6.6 on 12/11.  Coumadin resumed yesterday.  Today's INR 2.3.  *    Severe mitral stenosis, not a candidate for valve replacement or balloon valvuloplasty.  *    History colon cancer s/p right hemicolectomy 1994.  Adenomatous colon polyp 2011. Most recent colonoscopy 08/2015 with out recurrent polyps and healthy appearing anastomosis, sigmoid diverticulosis.    PLAN:     *    Per discussion with Dr. Silverio Decamp, scheduled patient for EGD for tomorrow morning. This would be a diagnostic study as patient is receiving Coumadin as of yesterday. Continue the Protonix that is now in place.     Azucena Freed   11/07/2020, 9:17 AM Phone [phone]   Attending physician's note   I have taken a history, examined the patient and reviewed the chart. I agree with the Advanced Practitioner's note, impression and recommendations.  86 year old very pleasant female with history of colon cancer s/p right hemicolectomy, A. fib on chronic  Coumadin, severe mitral stenosis, NSTEMI, pericarditis, CKD with complaints of severe nausea and intermittent vomiting.  She is also having decreased appetite and anorexia.  Her symptoms could be secondary to CHF or medication related (colchicine) though per patient her symptoms started even before she started taking colchicine  We will plan to proceed with diagnostic EGD to exclude any severe erosive esophagitis or Candida esophagitis, peptic ulcer disease or neoplasia though less likely Continue antireflux measures and Protonix  She likely has congestive hepatopathy from right-sided heart failure but labs and imaging are not consistent with portal hypertension or cirrhosis.  She had normal gallbladder on recent CT  Last dose of Coumadin was yesterday.  INR was supratherapeutic at 6.6 on admission improved to 2.3 this morning Recheck PT and INR tomorrow N.p.o. after midnight  The risks and benefits as well as alternatives of endoscopic procedure(s) have been discussed and reviewed. All questions answered. The patient agrees to proceed.  The patient was provided an opportunity to ask questions and all were answered. The patient agreed with the plan and demonstrated an understanding of the instructions.  Damaris Hippo , MD [phone]

## 2020-11-07 NOTE — Progress Notes (Signed)
Physical Therapy Treatment Patient Details Name: [name] MRN: [id number] DOB: January 23, 1929 Today's Date: 11/07/2020    History of Present Illness Patient is a 86 y/o female who presents with SOB and chest pain. CT-moderate pericardial effusion with CHF/pulmonary edema. EKG- A-fib rate controlled. PMH includes A-fib, CA, HTN, mitral valve disorder, OA, peripheral neuropathy, PVD, spinal surgery.    PT Comments    Pt progressing slowly towards her physical therapy goals. Participating in seated warm up exercises, toileting, and ambulating x 60 feet with a walker during session. Requires up to min assist for functional mobility. SpO2 91-95% on 2L O2, HR remains elevated 107-130's. Pt demonstrates significantly decreased cardiopulmonary endurance. D/c plan remains appropriate.     Follow Up Recommendations  Home health PT;Supervision - Intermittent     Equipment Recommendations  None recommended by PT    Recommendations for Other Services       Precautions / Restrictions Precautions Precautions: Fall;Other (comment) Precaution Comments: watch HR Restrictions Weight Bearing Restrictions: No    Mobility  Bed Mobility               General bed mobility comments: OOB in chair  Transfers Overall transfer level: Needs assistance Equipment used: Rolling walker (2 wheeled) Transfers: Sit to/from Stand Sit to Stand: Min guard;Min assist         General transfer comment: Min guard from chair, minA from lower surface of toilet  Ambulation/Gait Ambulation/Gait assistance: Min guard Gait Distance (Feet): 60 Feet Assistive device: Rolling walker (2 wheeled) Gait Pattern/deviations: Step-through pattern;Narrow base of support;Shuffle Gait velocity: decreased Gait velocity interpretation: <1.8 ft/sec, indicate of risk for recurrent falls General Gait Details: Very slow pace, tendency for downward gaze, cues for sequencing/direction   Stairs              Wheelchair Mobility    Modified Rankin (Stroke Patients Only)       Balance Overall balance assessment: Needs assistance Sitting-balance support: Feet supported;No upper extremity supported Sitting balance-Leahy Scale: Good     Standing balance support: No upper extremity supported;During functional activity Standing balance-Leahy Scale: Fair Standing balance comment: washing hands at sink without physical assist                            Cognition Arousal/Alertness: Awake/alert Behavior During Therapy: WFL for tasks assessed/performed Overall Cognitive Status: Within Functional Limits for tasks assessed                                        Exercises General Exercises - Lower Extremity Ankle Circles/Pumps: Both;10 reps;Seated Long Arc Quad: Both;5 reps;Seated Hip Flexion/Marching: Both;5 reps;Seated    General Comments        Pertinent Vitals/Pain Pain Assessment: Faces Faces Pain Scale: Hurts a little bit Pain Location: chest (chronic) Pain Descriptors / Indicators: Discomfort Pain Intervention(s): Limited activity within patient's tolerance;Monitored during session;Premedicated before session    Home Living                      Prior Function            PT Goals (current goals can now be found in the care plan section) Acute Rehab PT Goals Patient Stated Goal: get back to independence Potential to Achieve Goals: Good Progress towards PT goals: Progressing toward goals    Frequency  Min 3X/week      PT Plan Current plan remains appropriate    Co-evaluation              AM-PAC PT "6 Clicks" Mobility   Outcome Measure  Help needed turning from your back to your side while in a flat bed without using bedrails?: None Help needed moving from lying on your back to sitting on the side of a flat bed without using bedrails?: A Little Help needed moving to and from a bed to a chair (including a  wheelchair)?: A Little Help needed standing up from a chair using your arms (e.g., wheelchair or bedside chair)?: A Little Help needed to walk in hospital room?: A Little Help needed climbing 3-5 steps with a railing? : A Lot 6 Click Score: 18    End of Session Equipment Utilized During Treatment: Gait belt;Oxygen Activity Tolerance: Patient tolerated treatment well Patient left: in chair;with call bell/phone within reach;with family/visitor present Nurse Communication: Mobility status PT Visit Diagnosis: Difficulty in walking, not elsewhere classified (R26.2);Muscle weakness (generalized) (M62.81)     Time: 0600-4599 PT Time Calculation (min) (ACUTE ONLY): 34 min  Charges:  $Therapeutic Activity: 23-37 mins                     Wyona Almas, PT, DPT Acute Rehabilitation Services Pager [phone] Office [phone]    Deno Etienne 11/07/2020, 12:58 PM

## 2020-11-07 NOTE — Progress Notes (Signed)
[name] for Coumadin Indication: atrial fibrillation  Allergies  Allergen Reactions  . Codeine Nausea Only    Patient Measurements: Height: 5\' 11"  (180.3 cm) Weight: 82.1 kg (180 lb 14.4 oz) (scale A) IBW/kg (Calculated) : 70.8  Vital Signs: Temp: 97.5 F (36.4 C) (12/13 0821) Temp Source: Oral (12/13 0821) BP: 128/88 (12/13 0821) Pulse Rate: 100 (12/13 0821)  Labs: Recent Labs    11/05/20 0308 11/06/20 0410 11/07/20 0653  HGB 10.9* 11.8* 10.9*  HCT 31.7* 34.7* 32.5*  PLT 181 206 215  LABPROT 55.9* 29.0* 24.3*  INR 6.6* 2.8* 2.3*  CREATININE 1.78* 1.17* 0.88    Estimated Creatinine Clearance: 48.4 mL/min (by C-G formula based on SCr of 0.88 mg/dL).   Assessment: [name] presented on 11/01/2020 with chest pain, found to be secondary to moderate pericardial effusion.  Patient is on Coumadin 56m PO daily PTA for Afib.  INR was 2.8 on admission but increased to 6.6 on 11/05/20 while on home regimen, possibly due to reduced PO intake.  Vitamin K 2.5mg  by mouth was given 12/11 AM.   INR therapeutic and down to 2.3 today; no bleeding reported.  LFTs mildly elevated - she was taking Tylenol BID PTA.  Goal of Therapy:  INR 2-3 Monitor platelets by anticoagulation protocol: Yes   Plan:  Coumadin 4mg  PO today Daily PT / INR  [name] D. Mina Marble, PharmD, BCPS, [place] 11/07/2020, 8:25 AM

## 2020-11-07 NOTE — Consult Note (Addendum)
[facility] Consult: 9:17 AM 11/07/2020  LOS: 6 days    Referring Provider: Dr Ree Kida.    Primary Care Physician:  Laurey Morale, MD Primary Gastroenterologist:  Dr. Henrene [facility] power of attorney is her niece Mordecai Maes [phone]    Reason for Consultation:  nausea   HPI: [name] is a 86 y.o. female.  PMH Previous surgeries include right hemicolectomy, hysterectomy.  Atrial fibrillation.  Chronic Coumadin.  Severe mitral stenosis, not a candidate for valve replacement or balloon valvuloplasty.  NSTEMI 01/2019, cardiac cath w essentially normal coronaries. Previous pericarditis improved with colchicine.  CKD 3.    08/2015 colonoscopy for polyp/neoplasia surveillance.  Right-sided colon cancer 1994 s/p right hemicolectomy.  Tubular adenoma 04/2010.  Healthy-appearing ileocolonic anastomosis in the right colon.  Sigmoid diverticulosis. 03/2017 PSBO. 08/2019 abdominal ultrasound for elevated LFTs.  No gallstones or gallbladder irregularity.  CBD 4.1 mm normal liver, normal biliary tree.  Portal vein patent on Doppler studies with normal directional flow towards the liver.  Her problems with nausea and infrequent nonbloody emesis begin October 2020.  Nothing in particular seems to trigger the nausea.  She does not vomit very often but the nausea comes in waves.  Nausea may be present for a few days in a row with symptoms daytime and nighttime.  The longest she goes nausea free is 3 or 4 days.  She has been prescribed Zofran which, with as needed use, helps but she has not been prescribed or taken PPI or H2 blocker.  Food may or may not be a trigger.  She is generally anorexic but has not lost significant weight.  Reports weight fluctuation from 166 to 164#.  She wonders if she has long-haul Covid symptoms  since she tested positive for Covid in November 2020, which is a month after the onset of the nausea.  She was never hospitalized but she has lingering fatigue and the nausea.  Reports loose stools which are chronic, occasional constipation since her remote hemicolectomy.  Has never undergone upper endoscopy.  Does not use NSAIDs or aspirin  Admitted 6 days ago with chest pain, pericarditis.  Nausea and then vomiting have worsened with initiation of colchicine.  Therefore the med was stopped over the weekend but cardiology planning to resume this today at a lower dose.  11/01/2020 CT angio chest/abdomen/pelvis.  No aortic aneurysm or dissection.  Cardiomegaly, severe biatrial enlargement, moderate pericardial effusion.  CHF with pulmonary edema.  Groundglass opacity LUL.  Liver enlarged and somewhat nodular suggesting hepatic venous congestion, possibly due to right-sided heart failure.  Spleen enlarged measuring 13 cm craniocaudad.   11/02/2020 2D echo.  LVEF 55 to 60%.  Flattened intervention tubular septum consistent with right ventricular pressure and volume overload.  Moderately reduced right ventricular systolic function.  Severe left atrial dilation and mild right atrial dilation.  Degenerative mitral valve with mild to moderate regurgitation, severe mitral stenosis.  Moderate to severe tricuspid regurgitation.  Tricuspid aortic valve with mild to moderate stenosis.  IVC dilated suggesting right atrial pressure of 15 mmHg  moderate pericardial effusion without tamponade.  Na 126 >> 129.  K5.4 >> 4.1 Hgb 11.8 >> 1.9.  MCV high 90s -100. T bili 2.9>> 1.8.  Alk phos 94  >> 122.  AST/ALT 22/16  >> 59/73.  Albumin 2.9 CRP 10.7/  Lactic acid 2/1 >> 2 INR 6.6 >> 2.3  Trops wnl.  BNP 672.   urine clx growing 10 K Pseudomonas.     Past Medical History:  Diagnosis Date  . Anxiety   . Atrial fibrillation ([place])   . Cancer ([place])   . Cataract    REMOVED BILATERAL  . Chest pain, unspecified   . Chronic  low back pain   . Diverticulosis of colon   . History of colon cancer   . History of colonic polyps   . History of colonoscopy   . History of uterine cancer   . Hyperlipidemia   . Hypertension   . Hypothyroid   . Impaired glucose tolerance   . Mitral valve disorder   . Osteoarthritis   . Osteoporosis   . Patent foramen ovale   . Peripheral neuropathy   . Peripheral vascular disease ([place])   . Splenic infarction   . UTI (lower urinary tract infection)   . Venous insufficiency   . Vitamin B12 deficiency     Past Surgical History:  Procedure Laterality Date  . ANTERIOR AND POSTERIOR VAGINAL REPAIR  03/2006   Dr.Neal  . basal cell skin cancer Moh's surgery    . CATARACT EXTRACTION, BILATERAL    . COLONOSCOPY  05-25-10   per Dr. Henrene Pastor, benign polyps, repeat in 5 yrs   . EYE SURGERY    . hysterectomy for uterine cancer    . left carpal tunnel surgery  02/2007   Dr. Daylene Katayama  . LEFT HEART CATH AND CORONARY ANGIOGRAPHY N/A 02/12/2019   Procedure: LEFT HEART CATH AND CORONARY ANGIOGRAPHY;  Surgeon: Lorretta Harp, MD;  Location: [facility];  Service: Cardiovascular;  Laterality: N/A;  . lumbar laminectomy for spinal stenosis  2006   L3-4  . RETINAL DETACHMENT REPAIR W/ SCLERAL BUCKLE LE Right 03-01-14   per Dr. Zadie Rhine   . right hemicolectomy for colon cancer  1994    Prior to Admission medications   Medication Sig Start Date End Date Taking? Authorizing Provider  acetaminophen (TYLENOL) 500 MG tablet Take 2 tablets (1,000 mg total) by mouth 2 (two) times daily as needed. Patient taking differently: Take 1,000 mg by mouth 2 (two) times daily.  04/30/18  Yes Laurey Morale, MD  ALPRAZolam Duanne Moron) 0.25 MG tablet TAKE 1 TABLET BY MOUTH EVERY DAY AT BEDTIME AS NEEDED Patient taking differently: Take 0.125 mg by mouth at bedtime as needed for anxiety or sleep.  10/31/20  Yes Laurey Morale, MD  atorvastatin (LIPITOR) 20 MG tablet TAKE 1 TABLET BY MOUTH  DAILY Patient taking  differently: Take 20 mg by mouth daily.  06/16/20  Yes Josue Hector, MD  Calcium Carbonate w/Vitamin D (CALTRATE 600+D) 600-400 MG-UNIT TABS Take 1 tablet by mouth daily.    Yes [provider]  carvedilol (COREG) 12.5 MG tablet Take 1 tablet (12.5 mg total) by mouth 2 (two) times daily with a meal. 09/15/20  Yes Josue Hector, MD  cetirizine (ZYRTEC) 10 MG tablet Take 1 tablet (10 mg total) by mouth daily. Patient taking differently: Take 10 mg by mouth daily as needed for allergies.  10/30/18  Yes Hoyt Koch, MD  CLIMARA 0.1  MG/24HR patch Place 0.05 mg onto the skin once a week.  03/23/19  Yes [provider]  furosemide (LASIX) 40 MG tablet Take 1 tablet (40 mg total) by mouth daily. 04/11/20  Yes Josue Hector, MD  ketorolac (ACULAR) 0.5 % ophthalmic solution PLACE 1 DROP INTO THE RIGHT EYE TWICE A DAY FOR 30 DAYS Patient taking differently: Place 1 drop into the right eye 2 (two) times daily.  07/18/20  Yes Rankin, Clent Demark, MD  meclizine (ANTIVERT) 25 MG tablet Take 1 tablet (25 mg total) by mouth every 4 (four) hours as needed for dizziness. 12/08/19  Yes Laurey Morale, MD  Multiple Vitamins-Minerals (CENTRUM SILVER PO) Take 1 tablet by mouth daily.     Yes [provider]  nitroGLYCERIN (NITROSTAT) 0.4 MG SL tablet PLACE 1 TABLET UNDER THE TONGUE EVERY 5 MINUTES AS NEEDED FOR CHEST PAIN Patient taking differently: Place 0.4 mg under the tongue every 5 (five) minutes as needed for chest pain (max 3 doses).  06/17/20  Yes Laurey Morale, MD  ondansetron (ZOFRAN) 8 MG tablet TAKE 1 TABLET (8 MG TOTAL) BY MOUTH EVERY 6 (SIX) HOURS AS NEEDED FOR NAUSEA OR VOMITING. 10/31/20  Yes Laurey Morale, MD  phenazopyridine (PYRIDIUM) 200 MG tablet Take 1 tablet (200 mg total) by mouth 3 (three) times daily as needed (urinary burning). Patient taking differently: Take 200 mg by mouth 3 (three) times daily as needed (for urinary burning).  09/03/19  Yes Laurey Morale, MD   Polyethyl Glycol-Propyl Glycol (SYSTANE FREE OP) Place 1 drop into the left eye 2 (two) times daily.   Yes [provider]  potassium chloride SA (KLOR-CON) 20 MEQ tablet Take 1 tablet (20 mEq total) by mouth every other day. Patient taking differently: Take 20 mEq by mouth daily.  04/11/20  Yes Josue Hector, MD  quinapril (ACCUPRIL) 10 MG tablet Take 1 tablet (10 mg total) by mouth daily. 09/15/20  Yes Josue Hector, MD  spironolactone (ALDACTONE) 25 MG tablet TAKE 1 TABLET BY MOUTH  DAILY Patient taking differently: Take 25 mg by mouth daily.  06/16/20  Yes Josue Hector, MD  SYNTHROID 100 MCG tablet TAKE 1 TABLET BY MOUTH  DAILY BEFORE BREAKFAST Patient taking differently: Take 100 mcg by mouth daily before breakfast.  06/06/20  Yes Laurey Morale, MD  vitamin B-12 (CYANOCOBALAMIN) 1000 MCG tablet Take 1,000 mcg by mouth daily.     Yes [provider]  warfarin (COUMADIN) 5 MG tablet TAKE AS DIRECTED BY  [place] Patient taking differently: Take 5 mg by mouth daily.  10/26/20  Yes Josue Hector, MD    Scheduled Meds: . colchicine  0.6 mg Oral Daily  . feeding supplement  237 mL Oral TID BM  . ketorolac  1 drop Right Eye BID  . levothyroxine  100 mcg Oral Q0600  . metoprolol succinate  25 mg Oral Daily  . multivitamin with minerals  1 tablet Oral Daily  . ondansetron  4 mg Oral Q12H  . pantoprazole  40 mg Oral Daily  . pneumococcal 23 valent vaccine  0.5 mL Intramuscular Tomorrow-1000  . polyethylene glycol powder  1 Container Oral Once  . polyvinyl alcohol  1 drop Both Eyes BID  . vitamin B-12  1,000 mcg Oral Daily  . warfarin  4 mg Oral ONCE-1600  . Warfarin - Pharmacist Dosing Inpatient   Does not apply q1600   Infusions: . sodium chloride 75 mL/hr at  11/07/20 0347   PRN Meds: acetaminophen, ALPRAZolam, bisacodyl, HYDROcodone-acetaminophen, HYDROmorphone (DILAUDID) injection, ondansetron (ZOFRAN) IV   Allergies as of 11/01/2020 - Review  Complete 11/01/2020  Allergen Reaction Noted  . Codeine Nausea Only     Family History  Problem Relation Age of Onset  . Cancer Mother        deceased age 80  . Heart failure Father        deceased age 65  . Cancer Brother        deceased age 36  . Stroke Sister        and heart problems; deceased age 17  . Heart disease Sister     Social History   Socioeconomic History  . Marital status: Widowed    Spouse name: Not on file  . Number of children: 0  . Years of education: Not on file  . Highest education level: Not on file  Occupational History  . Occupation: retired    Comment: part time at [employer]  . Smoking status: Never Smoker  . Smokeless tobacco: Never Used  Substance and Sexual Activity  . Alcohol use: Yes    Alcohol/week: 0.0 standard drinks    Comment: occ  . Drug use: No  . Sexual activity: Never  Other Topics Concern  . Not on file  Social History Narrative  . Not on file   Social Determinants of Health   Financial Resource Strain: Not on file  Food Insecurity: Not on file  Transportation Needs: Not on file  Physical Activity: Not on file  Stress: Not on file  Social Connections: Not on file  Intimate Partner Violence: Not on file    REVIEW OF SYSTEMS: Constitutional: Fatigue ENT:  No nose bleeds Pulm: No significant shortness of breath or cough. CV:  No palpitations, no LE edema.  Chest pain GU:  No hematuria, no frequency GI: See HPI Heme: Excessive bleeding or bruising Transfusions: None Neuro:  No headaches, no peripheral tingling or numbness.  No syncope, no seizures Derm:  No itching, no rash or sores.  Endocrine:  No sweats or chills.  No polyuria or dysuria Immunization: Has received COVID-19 vaccination plus booster. Travel:  None beyond local counties in last few months.    PHYSICAL EXAM: Vital signs in last 24 hours: Vitals:   11/07/20 0515 11/07/20 0821  BP: 126/87 128/88  Pulse: 91 100  Resp: 20 16  Temp:  97.8 F (36.6 C) (!) 97.5 F (36.4 C)  SpO2: 95% 99%   Wt Readings from Last 3 Encounters:  11/07/20 82.1 kg  09/15/20 77.6 kg  05/25/20 78.4 kg    General: Patient actually looks fairly well, comfortable. Head: No facial asymmetry or swelling.  No signs of head trauma. Eyes: No conjunctival pallor or scleral icterus.  EOMI Ears: Not hard of hearing Nose: Gesturing or discharge Mouth: Oropharynx moist, pink, clear.  Good dentition.  Tongue midline Neck: No JVD, no thyromegaly, no masses Lungs: Lungs clear bilaterally.  No labored breathing or cough Heart: Irregularly irregular rhythm.  No tachycardia..  3/6 systolic murmur Abdomen: Soft without tenderness.  Bowel sounds active.  No HSM, masses, bruits, hernias.   Rectal: Deferred Musc/Skeltl: No joint redness, swelling or significant deformity. Extremities: No CCE. Neurologic: Oriented x3.  Moves all 4 limbs, strength not tested.  No tremors or gross deficits. Skin: Hyperpigmentation to the skin in the lower legs bilaterally Nodes: Cervical adenopathy Psych:, Pleasant, cooperative.  Affect normal  Intake/Output from previous  day: 12/12 0701 - 12/13 0700 In: 240 [P.O.:240] Out: 750 [Urine:750] Intake/Output this shift: Total I/O In: 400 [P.O.:400] Out: -   LAB RESULTS: Recent Labs    11/05/20 0308 11/06/20 0410 11/07/20 0653  WBC 8.5 8.3 8.6  HGB 10.9* 11.8* 10.9*  HCT 31.7* 34.7* 32.5*  PLT 181 206 215   BMET Lab Results  Component Value Date   NA 133 (L) 11/07/2020   NA 129 (L) 11/06/2020   NA 126 (L) 11/05/2020   K 4.2 11/07/2020   K 4.1 11/06/2020   K 4.6 11/05/2020   CL 103 11/07/2020   CL 99 11/06/2020   CL 96 (L) 11/05/2020   CO2 23 11/07/2020   CO2 20 (L) 11/06/2020   CO2 21 (L) 11/05/2020   GLUCOSE 125 (H) 11/07/2020   GLUCOSE 99 11/06/2020   GLUCOSE 140 (H) 11/05/2020   BUN 27 (H) 11/07/2020   BUN 42 (H) 11/06/2020   BUN 63 (H) 11/05/2020   CREATININE 0.88 11/07/2020   CREATININE 1.17  (H) 11/06/2020   CREATININE 1.78 (H) 11/05/2020   CALCIUM 8.3 (L) 11/07/2020   CALCIUM 8.5 (L) 11/06/2020   CALCIUM 8.4 (L) 11/05/2020   LFT Recent Labs    11/07/20 0653  PROT 5.7*  ALBUMIN 2.9*  AST 59*  ALT 73*  ALKPHOS 122  BILITOT 1.8*   PT/INR Lab Results  Component Value Date   INR 2.3 (H) 11/07/2020   INR 2.8 (H) 11/06/2020   INR 6.6 (HH) 11/05/2020   PROTIME 18.1 04/22/2009   Hepatitis Panel No results for input(s): HEPBSAG, HCVAB, HEPAIGM, HEPBIGM in the last 72 hours. C-Diff No components found for: CDIFF Lipase     Component Value Date/Time   LIPASE 25 11/01/2020 1849    Drugs of Abuse  No results found for: LABOPIA, COCAINSCRNUR, LABBENZ, AMPHETMU, THCU, LABBARB   RADIOLOGY STUDIES: No results found.   IMPRESSION:   *     Chronic intermittent but fairly frequent nausea, limited nonbloody emesis, anorexia. ?  GERD, ?  PUD. ?  Related to right-sided heart failure Symptoms acutely worse with the initiation of colchicine, briefly held but now to be restarted at lower doses.  *    ?  Cirrhosis of the liver and hepatic venous congestion from right-sided heart failure per CT scan  *    Chest pain, pericarditis.  Colchicine resumed, had been helpful in the past.  Cardiology tweaking her meds.  *    Permanent A. fib, chronic Coumadin.  INR was up to 6.6 on 12/11.  Coumadin resumed yesterday.  Today's INR 2.3.  *    Severe mitral stenosis, not a candidate for valve replacement or balloon valvuloplasty.  *    History colon cancer s/p right hemicolectomy 1994.  Adenomatous colon polyp 2011. Most recent colonoscopy 08/2015 with out recurrent polyps and healthy appearing anastomosis, sigmoid diverticulosis.    PLAN:     *    Per discussion with Dr. Silverio Decamp, scheduled patient for EGD for tomorrow morning. This would be a diagnostic study as patient is receiving Coumadin as of yesterday. Continue the Protonix that is now in place.     Azucena Freed   11/07/2020, 9:17 AM Phone [phone]   Attending physician's note   I have taken a history, examined the patient and reviewed the chart. I agree with the Advanced Practitioner's note, impression and recommendations.  86 year old very pleasant female with history of colon cancer s/p right hemicolectomy, A. fib on chronic  Coumadin, severe mitral stenosis, NSTEMI, pericarditis, CKD with complaints of severe nausea and intermittent vomiting.  She is also having decreased appetite and anorexia.  Her symptoms could be secondary to CHF or medication related (colchicine) though per patient her symptoms started even before she started taking colchicine  We will plan to proceed with diagnostic EGD to exclude any severe erosive esophagitis or Candida esophagitis, peptic ulcer disease or neoplasia though less likely Continue antireflux measures and Protonix  She likely has congestive hepatopathy from right-sided heart failure but labs and imaging are not consistent with portal hypertension or cirrhosis.  She had normal gallbladder on recent CT  Last dose of Coumadin was yesterday.  INR was supratherapeutic at 6.6 on admission improved to 2.3 this morning Recheck PT and INR tomorrow N.p.o. after midnight  The risks and benefits as well as alternatives of endoscopic procedure(s) have been discussed and reviewed. All questions answered. The patient agrees to proceed.  The patient was provided an opportunity to ask questions and all were answered. The patient agreed with the plan and demonstrated an understanding of the instructions.  Damaris Hippo , MD [phone]

## 2020-11-07 NOTE — Progress Notes (Signed)
PROGRESS NOTE    [name]  LPF:[id number] DOB: [birth year] DOA: 11/01/2020 PCP: Laurey Morale, MD   Brief Narrative:  HPI on 11/01/2020 by Dr. Gean Birchwood [name] is a 86 y.o. female with history of A. fib, mitral stenosis, chronic kidney disease stage III, chronic hyponatremia, history of colon cancer, hypothyroidism, anemia presents to the ER because of chest pain.  Patient has been having chest pain for the last 1 week mostly in the center of the chest radiating to the left arm.  Patient did have some dysuria over the last few days and had gone to the urgent care center on Saturday about 4 days ago and was given antibiotics which patient has been taking for last 3 days.  Her dysuria has improved.  But due to worsening chest pain which is pleuritic in nature radiating to her left shoulder patient presents to the ER.  Denies any nausea vomiting diarrhea has some shortness of breath.  Felt weak.  Patient received her COVID-19 booster shot vaccination a month ago exactly.  Interim history Admitted with chest pain likely secondary to pericardial effusion/pericarditis.  Started on colchicine by cardiology. Course complicated by AKI and supratherapeutic INR.  Given continued nausea and poor appetite, gastroenterology consulted.  Assessment & Plan   Chest pain likely secondary to moderate pericardial effusion/pericarditis/ Pleuritic pain -CT chest no evidence of aortic dissection or aneurysm.  Cardiomegaly with severe biatrial enlargement, moderate sized pericardial effusion.  ESR as well as CRP were elevated. -Cardiology consulted and appreciated -Currently no signs of tamponade -Echocardiogram showed EF of 55 to 60%.  No regional wall motion abnormalities.  Moderate left LVH.  LV diastolic parameters indeterminate.  RV systolic function mildly reduced.  Mitral valve degenerative, mild to moderate MV regurgitation, severe mitral stenosis.  Moderate to severe tricuspid valve  regurgitation.  Severe calcification of aortic valve with trivial regurgitation and mild to moderate aortic valve stenosis. -Cardiology initially added colchicine. Discussed with Dr. Johnsie Cancel, colchicine discontinued 11/06/2020 (as this could be contributing to her nausea and poor appetite however this has been ongoing for >1year) -Continue pain control with IV Dilaudid along with hydrocodone.  -Continue Xanax for anxiety -Discussed with Dr. Audie Box, cardiology, who felt that this was pericarditis and is restarted patient on colchicine given that patient continued to have pleuritic chest pain returns with worsening over the past day.  Severe mitral stenosis/elevated BNP- Acute diastolic CHF  -CT scan shows findings suggestive of heart failure developing pulmonary edema -BNP 672 -Last echocardiogram Mar 30, 2020 shows an EF of 60 to 65%.  LV diastolic function could not be evaluated. -Patient was given 1 dose of IV Lasix -Blood pressures have improved -Cardiology consulted and appreciated -Echocardiogram as above  Acute kidney injury on Chronic kidney disease, stage IIIa with hyperkalemia -As above, patient did receive 1 dose of IV Lasix in the emergency department -Potassium remains elevated at 5.4 (despite Lokelma, potassium continues to be elevated, will reorder 2 doses of Lokelma) -Creatinine peaked to 1.94 (baseline 1.1)- now down to 0.88 -Renal US: Turning of the renal parenchyma bilaterally, can be seen in medical renal disease.  No acute abnormality or obstruction -Urine electrolytes as well as repeat UA and culture ordered -discontinued IVF  Chronic hyponatremia -Sodium currently 133 (baseline approximately 130-134) -Patient asymptomatic -Continue to monitor BMP  Chronic anemia -Hemoglobin stable, currently 10.9 -Continue to monitor CBC  Atrial fibrillation, with supratherapeutic INR -Continue Coumadin per pharmacy -Currently patient is tachycardic in the low 100s (  106)-pain may  also be contributing to this -INR peaked to 6.6, however down to 2.3 today (was given oral vitamin K several days ago)  Essential hypertension -BP stable -Antihypertensive medications have been held due to soft BPs  UTI/asymptomatic bacteriuria -Patient was recently treated for urinary tract infection however currently is asymptomatic -Patient was treated with Keflex as an outpatient -Urine culture returned 10,000 colonies of Pseudomonas aeruginosa  -Will discontinue ceftriaxone as this would not cover Pseudomonas -Repeat UA unremarkable for infection  Hypothyroidism -Continue Synthroid  Thrombocytopenia -Resolved, continue to monitor CBC  Lung opacity -Noted on CT chest, groundglass airspace opacity left upper lobe measuring 6 mm -May be secondary to pulmonary edema. -Follow-up recommendations for repeat CT in 6 to 12 months  Mildly elevated Lactic acid -likely secondary to the above -no infectious etiology   Nausea -Has been ongoing for over a year -Started patient on PPI -Continue antiemetics as needed as well as 2 doses scheduled -Discussed with niece via phone, nausea seems to worsen with movement -Orthostatics were unremarkable -Patient also tells me that she has had a history of vertigo however denies any current dizziness -Continue to monitor -Discussed with gastroenterology, question if patient needs EGD  Moderate malnutrition -In the context of chronic illness -Patient tells me she has had poor appetite since having Covid over 1 year ago -Nutrition consulted -Continue supplements  -Patient may consider discussing with her PCP use of appetite stimulants although this will take several weeks to see any results  Constipation -Resolved with enema -Suspect multifactorial including poor oral intake, pain medication -Continue Dulcolax as needed  DVT Prophylaxis Coumadin  Code Status: DNR  Family Communication: None at bedside. Niece via phone on  11/05/2020  Disposition Plan:  Status is: Inpatient  Remains inpatient appropriate because:Ongoing active pain requiring inpatient pain management, Ongoing diagnostic testing needed not appropriate for outpatient work up, IV treatments appropriate due to intensity of illness or inability to take PO and Inpatient level of care appropriate due to severity of illness   Dispo: The patient is from: Home              Anticipated d/c is to: Home              Anticipated d/c date is: 3 days              Patient currently is not medically stable to d/c.  Consultants Cardiology Gastroenterology  Procedures  Echocardiogram  Antibiotics   Anti-infectives (From admission, onward)   Start     Dose/Rate Route Frequency Ordered Stop   11/02/20 0500  cefTRIAXone (ROCEPHIN) 1 g in sodium chloride 0.9 % 100 mL IVPB  Status:  Discontinued        1 g 200 mL/hr over 30 Minutes Intravenous Daily 11/02/20 0446 11/04/20 0827      Subjective:   [name] seen and examined today.  Patient feels that she is having more chest pain this morning.  She denies current shortness of breath, abdominal pain, dizziness or headache.  Patient continues to have nausea, without vomiting.    Objective:   Vitals:   11/06/20 2236 11/07/20 0515 11/07/20 0821 11/07/20 1122  BP: 121/80 126/87 128/88 117/79  Pulse: 95 91 100 94  Resp: _0 Temp: 97.8 F (36.6 C) 97.8 F (36.6 C) (!) 97.5 F (36.4 C) (!) 97.5 F (36.4 C)  TempSrc: Oral Oral Oral Oral  SpO2: 99% 95% 99% 99%  Weight:  82.1 kg  Height:        Intake/Output Summary (Last 24 hours) at 11/07/2020 1352 Last data filed at 11/07/2020 1216 Gross per 24 hour  Intake 880 ml  Output 750 ml  Net 130 ml   Filed Weights   11/05/20 0007 11/06/20 0325 11/07/20 0515  Weight: 77.2 kg 80.7 kg 82.1 kg   Exam  General: Well developed, chronically ill-appearing, thin, NAD  HEENT: NCAT,  mucous membranes moist.   Cardiovascular: S1 S2  auscultated, 3/6 SEM, irregular  Respiratory: Clear to auscultation bilaterally  Abdomen: Soft, nontender, nondistended, + bowel sounds  Extremities: warm dry without cyanosis clubbing or edema  Neuro: AAOx3, nonfocal  Psych: appropriate mood and affect   Data Reviewed: I have personally reviewed following labs and imaging studies  CBC: Recent Labs  Lab 11/02/20 0747 11/03/20 0312 11/04/20 0705 11/05/20 0308 11/06/20 0410 11/07/20 0653  WBC 16.9* 11.8* 10.0 8.5 8.3 8.6  NEUTROABS 13.6*  --   --   --   --   --   HGB 11.8* 11.3* 11.3* 10.9* 11.8* 10.9*  HCT 36.3 33.3* 35.5* 31.7* 34.7* 32.5*  MCV 100.3* 97.1 99.4 96.1 96.7 97.3  PLT 160 161 166 181 206 215   Basic Metabolic Panel: Recent Labs  Lab 11/03/20 0312 11/04/20 0233 11/05/20 0308 11/06/20 0410 11/07/20 0653  NA 126* 127* 126* 129* 133*  K 5.5* 5.4* 4.6 4.1 4.2  CL 94* 94* 96* 99 103  CO2 22 22 21* 20* 23  GLUCOSE 125* 139* 140* 99 125*  BUN 47* 62* 63* 42* 27*  CREATININE 1.63* 1.94* 1.78* 1.17* 0.88  CALCIUM 9.3 8.6* 8.4* 8.5* 8.3*   GFR: Estimated Creatinine Clearance: 48.4 mL/min (by C-G formula based on SCr of 0.88 mg/dL). Liver Function Tests: Recent Labs  Lab 11/01/20 1849 11/02/20 0747 11/07/20 0653  AST 22 23 59*  ALT 16 16 73*  ALKPHOS 94 87 122  BILITOT 3.2* 2.9* 1.8*  PROT 6.9 6.7 5.7*  ALBUMIN 3.6 3.5 2.9*   Recent Labs  Lab 11/01/20 1849  LIPASE 25   No results for input(s): AMMONIA in the last 168 hours. Coagulation Profile: Recent Labs  Lab 11/03/20 0312 11/04/20 0233 11/05/20 0308 11/06/20 0410 11/07/20 0653  INR 3.2* 4.8* 6.6* 2.8* 2.3*   Cardiac Enzymes: No results for input(s): CKTOTAL, CKMB, CKMBINDEX, TROPONINI in the last 168 hours. BNP (last 3 results) No results for input(s): PROBNP in the last 8760 hours. HbA1C: No results for input(s): HGBA1C in the last 72 hours. CBG: No results for input(s): GLUCAP in the last 168 hours. Lipid Profile: No  results for input(s): CHOL, HDL, LDLCALC, TRIG, CHOLHDL, LDLDIRECT in the last 72 hours. Thyroid Function Tests: No results for input(s): TSH, T4TOTAL, FREET4, T3FREE, THYROIDAB in the last 72 hours. Anemia Panel: No results for input(s): VITAMINB12, FOLATE, FERRITIN, TIBC, IRON, RETICCTPCT in the last 72 hours. Urine analysis:    Component Value Date/Time   COLORURINE AMBER (A) 11/04/2020 1727   APPEARANCEUR HAZY (A) 11/04/2020 1727   LABSPEC 1.017 11/04/2020 1727   PHURINE 5.0 11/04/2020 1727   GLUCOSEU NEGATIVE 11/04/2020 1727   GLUCOSEU NEGATIVE 07/15/2008 1258   HGBUR NEGATIVE 11/04/2020 1727   BILIRUBINUR NEGATIVE 11/04/2020 1727   BILIRUBINUR negative 05/25/2020 1430   KETONESUR NEGATIVE 11/04/2020 1727   PROTEINUR NEGATIVE 11/04/2020 1727   UROBILINOGEN 0.2 05/25/2020 1430   UROBILINOGEN 0.2 09/12/2014 1810   NITRITE NEGATIVE 11/04/2020 1727   LEUKOCYTESUR NEGATIVE 11/04/2020 1727   Sepsis Labs: @LABRCNTIP(procalcitonin:4,lacticidven:4)  )   Recent Results (from the past 240 hour(s))  Resp Panel by RT-PCR (Flu A&B, Covid) Nasopharyngeal Swab     Status: None   Collection Time: 11/01/20  7:58 PM   Specimen: Nasopharyngeal Swab; Nasopharyngeal(NP) swabs in vial transport medium  Result Value Ref Range Status   SARS Coronavirus 2 by RT PCR NEGATIVE NEGATIVE Final    Comment: (NOTE) SARS-CoV-2 target nucleic acids are NOT DETECTED.  The SARS-CoV-2 RNA is generally detectable in upper respiratory specimens during the acute phase of infection. The lowest concentration of SARS-CoV-2 viral copies this assay can detect is 138 copies/mL. A negative result does not preclude SARS-Cov-2 infection and should not be used as the sole basis for treatment or other patient management decisions. A negative result may occur with  improper specimen collection/handling, submission of specimen other than nasopharyngeal swab, presence of viral mutation(s) within the areas targeted by this  assay, and inadequate number of viral copies(<138 copies/mL). A negative result must be combined with clinical observations, patient history, and epidemiological information. The expected result is Negative.  Fact Sheet for Patients:  https://www.fda.gov/media/152166/download  Fact Sheet for Healthcare Providers:  https://www.fda.gov/media/152162/download  This test is no t yet approved or cleared by the [place] FDA and  has been authorized for detection and/or diagnosis of SARS-CoV-2 by FDA under an Emergency Use Authorization (EUA). This EUA will remain  in effect (meaning this test can be used) for the duration of the COVID-19 declaration under Section 564(b)(1) of the Act, 21 U.S.C.section 360bbb-3(b)(1), unless the authorization is terminated  or revoked sooner.       Influenza A by PCR NEGATIVE NEGATIVE Final   Influenza B by PCR NEGATIVE NEGATIVE Final    Comment: (NOTE) The Xpert Xpress SARS-CoV-2/FLU/RSV plus assay is intended as an aid in the diagnosis of influenza from Nasopharyngeal swab specimens and should not be used as a sole basis for treatment. Nasal washings and aspirates are unacceptable for Xpert Xpress SARS-CoV-2/FLU/RSV testing.  Fact Sheet for Patients: https://www.fda.gov/media/152166/download  Fact Sheet for Healthcare Providers: https://www.fda.gov/media/152162/download  This test is not yet approved or cleared by the [place] FDA and has been authorized for detection and/or diagnosis of SARS-CoV-2 by FDA under an Emergency Use Authorization (EUA). This EUA will remain in effect (meaning this test can be used) for the duration of the COVID-19 declaration under Section 564(b)(1) of the Act, 21 U.S.C. section 360bbb-3(b)(1), unless the authorization is terminated or revoked.  Performed at [facility], [address] [place], [place] [address]   Urine Culture     Status: Abnormal   Collection Time: 11/02/20  6:10 AM    Specimen: Urine, Random  Result Value Ref Range Status   Specimen Description URINE, RANDOM  Final   Special Requests   Final    NONE Performed at [facility], [address] [place], [place] [address]    Culture 10,000 COLONIES/mL PSEUDOMONAS AERUGINOSA (A)  Final   Report Status 11/04/2020 FINAL  Final   Organism ID, Bacteria PSEUDOMONAS AERUGINOSA (A)  Final      Susceptibility   Pseudomonas aeruginosa - MIC*    CEFTAZIDIME <=1 SENSITIVE Sensitive     CIPROFLOXACIN 0.5 SENSITIVE Sensitive     GENTAMICIN <=1 SENSITIVE Sensitive     IMIPENEM 2 SENSITIVE Sensitive     PIP/TAZO <=4 SENSITIVE Sensitive     CEFEPIME 0.5 SENSITIVE Sensitive     * 10,000 COLONIES/mL PSEUDOMONAS AERUGINOSA      Radiology Studies: No results   found.   Scheduled Meds: . colchicine  0.6 mg Oral Daily  . feeding supplement  237 mL Oral TID BM  . ketorolac  1 drop Right Eye BID  . levothyroxine  100 mcg Oral Q0600  . metoprolol succinate  25 mg Oral Daily  . multivitamin with minerals  1 tablet Oral Daily  . ondansetron  4 mg Oral Q12H  . pantoprazole  40 mg Oral Daily  . pneumococcal 23 valent vaccine  0.5 mL Intramuscular Tomorrow-1000  . polyethylene glycol powder  1 Container Oral Once  . polyvinyl alcohol  1 drop Both Eyes BID  . vitamin B-12  1,000 mcg Oral Daily  . warfarin  4 mg Oral ONCE-1600  . Warfarin - Pharmacist Dosing Inpatient   Does not apply q1600   Continuous Infusions: . sodium chloride 75 mL/hr at 11/07/20 0347     LOS: 6 days   Time Spent in minutes   45 minutes  Maryann Mikhail D.O. on 11/07/2020 at 1:52 PM  Between 7am to 7pm - Please see pager noted on [email or web address]  After 7pm go to [email or web address]  And look for the night coverage person covering for me after hours  [facility] Office  [phone]  

## 2020-11-07 NOTE — Progress Notes (Signed)
Occupational Therapy Treatment Patient Details Name: [name] MRN: [id number] DOB: [birth year] Today's Date: 11/07/2020    History of present illness Patient is a 86 y/o female who presents with SOB and chest pain. CT-moderate pericardial effusion with CHF/pulmonary edema. EKG- A-fib rate controlled. PMH includes A-fib, CA, HTN, mitral valve disorder, OA, peripheral neuropathy, PVD, spinal surgery.   OT comments  Pt making good progress with functional goals. Pt in better spirits this session and niece (POA) is present. Pt ambulated to bathroom with RW with min guard A for toilet transfers and toileting tasks. OT will continue to follow acutely to maximize level of function and safety  Follow Up Recommendations  Home health OT;Supervision - Intermittent    Equipment Recommendations  Other (comment) Management consultant)    Recommendations for Other Services      Precautions / Restrictions Precautions Precautions: Fall;Other (comment) Precaution Comments: watch HR Restrictions Weight Bearing Restrictions: No       Mobility Bed Mobility               General bed mobility comments: OOB in chair upo OT arrival  Transfers Overall transfer level: Needs assistance Equipment used: Rolling walker (2 wheeled) Transfers: Sit to/from Stand Sit to Stand: Min guard         General transfer comment: Min guard from chair and toilet, increasd time and required power up    Balance Overall balance assessment: Needs assistance Sitting-balance support: Feet supported;No upper extremity supported Sitting balance-Leahy Scale: Good     Standing balance support: No upper extremity supported;During functional activity Standing balance-Leahy Scale: Fair Standing balance comment: washing hands at sink without physical assist                           ADL either performed or assessed with clinical judgement   ADL Overall ADL's : Needs assistance/impaired     Grooming:  Wash/dry hands;Standing;Supervision/safety;Wash/dry face       Lower Body Bathing: Min guard;Sit to/from stand Lower Body Bathing Details (indicate cue type and reason): simulated     Lower Body Dressing: Min guard;Sit to/from stand;Sitting/lateral leans Lower Body Dressing Details (indicate cue type and reason): able to don socks seated in recliner Toilet Transfer: Min guard;Ambulation;RW;Regular Toilet;Grab bars   Toileting- Clothing Manipulation and Hygiene: Sit to/from stand;Supervision/safety       Functional mobility during ADLs: Min guard;Rolling walker General ADL Comments: Pt donned socks seated in recliner. pt ambulated to bathroom with RW to transfer to toilet using grab bars, Sup to manage clothing and for hygiene. Stood at sink to wash and dry hands     Vision Patient Visual Report: No change from baseline     Perception     Praxis      Cognition Arousal/Alertness: Awake/alert Behavior During Therapy: WFL for tasks assessed/performed Overall Cognitive Status: Within Functional Limits for tasks assessed                                          Exercises Exercises: General Lower Extremity General Exercises - Lower Extremity Ankle Circles/Pumps: Both;10 reps;Seated Long Arc Quad: Both;5 reps;Seated Hip Flexion/Marching: Both;5 reps;Seated   Shoulder Instructions       General Comments      Pertinent Vitals/ Pain       Pain Assessment: No/denies pain Faces Pain Scale: No hurt Pain Location: chest (  chronic) Pain Descriptors / Indicators: Discomfort Pain Intervention(s): Monitored during session;Repositioned  Home Living                                          Prior Functioning/Environment              Frequency  Min 2X/week        Progress Toward Goals  OT Goals(current goals can now be found in the care plan section)  Progress towards OT goals: Progressing toward goals  Acute Rehab OT  Goals Patient Stated Goal: go home  Plan Discharge plan remains appropriate    Co-evaluation                 AM-PAC OT "6 Clicks" Daily Activity     Outcome Measure   Help from another person eating meals?: None Help from another person taking care of personal grooming?: A Little Help from another person toileting, which includes using toliet, bedpan, or urinal?: A Little Help from another person bathing (including washing, rinsing, drying)?: A Little Help from another person to put on and taking off regular upper body clothing?: None Help from another person to put on and taking off regular lower body clothing?: A Little 6 Click Score: 20    End of Session Equipment Utilized During Treatment: Gait belt;Rolling walker  OT Visit Diagnosis: Muscle weakness (generalized) (M62.81);Unsteadiness on feet (R26.81)   Activity Tolerance Patient tolerated treatment well   Patient Left with family/visitor present;in chair;with call bell/phone within reach   Nurse Communication          Time: 4235-3614 OT Time Calculation (min): 33 min  Charges: OT General Charges $OT Visit: 1 Visit OT Treatments $Self Care/Home Management : 8-22 mins $Therapeutic Activity: 8-22 mins    Britt Bottom 11/07/2020, 3:56 PM

## 2020-11-07 NOTE — Progress Notes (Signed)
Cardiology Progress Note  Patient ID: [name] MRN: [id number] DOB: [birth year] Date of Encounter: 11/07/2020  Primary Cardiologist: Jenkins Rouge, MD  Subjective   Chief Complaint: Chest pain this AM  HPI: Admitted with concern for pericarditis.  Apparently has had nausea for 1 year.  Colchicine did improve her symptoms.  She has remained on Coumadin despite pericarditis.  ROS:  All other ROS reviewed and negative. Pertinent positives noted in the HPI.     Inpatient Medications  Scheduled Meds: . atorvastatin  20 mg Oral Daily  . carvedilol  3.125 mg Oral BID WC  . colchicine  0.3 mg Oral BID  . feeding supplement  237 mL Oral TID BM  . ketorolac  1 drop Right Eye BID  . levothyroxine  100 mcg Oral Q0600  . multivitamin with minerals  1 tablet Oral Daily  . ondansetron  4 mg Oral Q12H  . pantoprazole  40 mg Oral Daily  . pneumococcal 23 valent vaccine  0.5 mL Intramuscular Tomorrow-1000  . polyethylene glycol powder  1 Container Oral Once  . polyvinyl alcohol  1 drop Both Eyes BID  . vitamin B-12  1,000 mcg Oral Daily  . warfarin  4 mg Oral ONCE-1600  . Warfarin - Pharmacist Dosing Inpatient   Does not apply q1600   Continuous Infusions: . sodium chloride 75 mL/hr at 11/07/20 0347   PRN Meds: acetaminophen, ALPRAZolam, bisacodyl, HYDROcodone-acetaminophen, HYDROmorphone (DILAUDID) injection, ondansetron (ZOFRAN) IV   Vital Signs   Vitals:   11/06/20 1212 11/06/20 2236 11/07/20 0515 11/07/20 0821  BP: (!) 130/92 121/80 126/87 128/88  Pulse: 98 95 91 100  Resp: 18 20 20 16   Temp: 97.9 F (36.6 C) 97.8 F (36.6 C) 97.8 F (36.6 C) (!) 97.5 F (36.4 C)  TempSrc: Oral Oral Oral Oral  SpO2: 99% 99% 95% 99%  Weight:   82.1 kg   Height:        Intake/Output Summary (Last 24 hours) at 11/07/2020 0900 Last data filed at 11/07/2020 0835 Gross per 24 hour  Intake 640 ml  Output 750 ml  Net -110 ml   Last 3 Weights 11/07/2020 11/06/2020 11/05/2020  Weight  (lbs) 180 lb 14.4 oz 178 lb 170 lb 3.1 oz  Weight (kg) 82.056 kg 80.74 kg 77.2 kg      Telemetry  Overnight telemetry shows Afib 100s, which I personally reviewed.   ECG  The most recent ECG shows A. fib, heart rate 83, right axis deviation, which I personally reviewed.   Physical Exam   Vitals:   11/06/20 1212 11/06/20 2236 11/07/20 0515 11/07/20 0821  BP: (!) 130/92 121/80 126/87 128/88  Pulse: 98 95 91 100  Resp: 18 20 20 16   Temp: 97.9 F (36.6 C) 97.8 F (36.6 C) 97.8 F (36.6 C) (!) 97.5 F (36.4 C)  TempSrc: Oral Oral Oral Oral  SpO2: 99% 99% 95% 99%  Weight:   82.1 kg   Height:         Intake/Output Summary (Last 24 hours) at 11/07/2020 0900 Last data filed at 11/07/2020 0835 Gross per 24 hour  Intake 640 ml  Output 750 ml  Net -110 ml    Last 3 Weights 11/07/2020 11/06/2020 11/05/2020  Weight (lbs) 180 lb 14.4 oz 178 lb 170 lb 3.1 oz  Weight (kg) 82.056 kg 80.74 kg 77.2 kg    Body mass index is 25.23 kg/m.  General: Well nourished, well developed, in no acute distress Head: Atraumatic, normal size  Eyes: PEERLA, EOMI  Neck: Supple, no JVD Endocrine: No thryomegaly Cardiac: Normal S1, S2; 3 out of 6 holosystolic murmur, irregular rhythm, diastolic rumble noted Lungs: Clear to auscultation bilaterally, no wheezing, rhonchi or rales  Abd: Soft, nontender, no hepatomegaly  Ext: No edema, pulses 2+ Musculoskeletal: No deformities, BUE and BLE strength normal and equal Skin: Warm and dry, no rashes   Neuro: Alert and oriented to person, place, time, and situation, CNII-XII grossly intact, no focal deficits  Psych: Normal mood and affect   Labs  High Sensitivity Troponin:   Recent Labs  Lab 11/01/20 1649 11/01/20 1849 11/02/20 0041  TROPONINIHS 9 10 13      Cardiac EnzymesNo results for input(s): TROPONINI in the last 168 hours. No results for input(s): TROPIPOC in the last 168 hours.  Chemistry Recent Labs  Lab 11/01/20 1849 11/01/20 2018  11/02/20 0747 11/03/20 0312 11/05/20 0308 11/06/20 0410 11/07/20 0653  NA  --    < > 128*   < > 126* 129* 133*  K  --    < > 5.2*   < > 4.6 4.1 4.2  CL  --    < > 95*   < > 96* 99 103  CO2  --   --  19*   < > 21* 20* 23  GLUCOSE  --    < > 117*   < > 140* 99 125*  BUN  --    < > 31*   < > 63* 42* 27*  CREATININE  --    < > 1.10*   < > 1.78* 1.17* 0.88  CALCIUM  --   --  9.3   < > 8.4* 8.5* 8.3*  PROT 6.9  --  6.7  --   --   --  5.7*  ALBUMIN 3.6  --  3.5  --   --   --  2.9*  AST 22  --  23  --   --   --  59*  ALT 16  --  16  --   --   --  73*  ALKPHOS 94  --  87  --   --   --  122  BILITOT 3.2*  --  2.9*  --   --   --  1.8*  GFRNONAA  --   --  48*   < > 27* 45* >60  ANIONGAP  --   --  14   < > 9 10 7    < > = values in this interval not displayed.    Hematology Recent Labs  Lab 11/05/20 0308 11/06/20 0410 11/07/20 0653  WBC 8.5 8.3 8.6  RBC 3.30* 3.59* 3.34*  HGB 10.9* 11.8* 10.9*  HCT 31.7* 34.7* 32.5*  MCV 96.1 96.7 97.3  MCH 33.0 32.9 32.6  MCHC 34.4 34.0 33.5  RDW 12.8 12.7 13.0  PLT 181 206 215   BNP Recent Labs  Lab 11/01/20 1827  BNP 672.0*    DDimer No results for input(s): DDIMER in the last 168 hours.   Radiology  No results found.  Cardiac Studies  TTE 11/02/2020 1. Left ventricular ejection fraction, by estimation, is 55 to 60%. The  left ventricle has normal function. The left ventricle has no regional  wall motion abnormalities. There is moderate left ventricular hypertrophy.  Left ventricular diastolic  parameters are indeterminate. There is the interventricular septum is  flattened in systole and diastole, consistent with right ventricular  pressure and volume overload.  2.  Right ventricular systolic function is moderately reduced. The right  ventricular size is moderately enlarged. There is moderately elevated  pulmonary artery systolic pressure. The estimated right ventricular  systolic pressure is 77.8 mmHg.  3. Left atrial size was  severely dilated.  4. Right atrial size was mildly dilated.  5. The mitral valve is degenerative. Mild to moderate mitral valve  regurgitation. Severe mitral stenosis. Severe mitral annular  calcification. MG 19 mmHg at HR 117 bpm, MVA 0.7 cm^2 by continuity  equation  6. Tricuspid valve regurgitation is moderate to severe.  7. The aortic valve is tricuspid. There is severe calcifcation of the  aortic valve. Aortic valve regurgitation is trivial. Mild to moderate  aortic valve stenosis. Mild AS by gradients (Vmax 2.5 m/s, MG 14 mmHG),  likely due to low SV (SVi 21 cc/m^2). AS  is moderate by AVA (1.1 cm^2) and DI (0.31). Suspect moderate AS.  8. The inferior vena cava is dilated in size with <50% respiratory  variability, suggesting right atrial pressure of 15 mmHg.  9. Moderate pericardial effusion measuring 1.5cm adjacent to LV posterior  wall. No evidence of tamponade. IVC is fixed/dilated, but no RA/RV  collapse or significant mitral inflow respiratory variation seen.   Patient Profile  [name] is a 86 y.o. female with A. fib that is permanent, severe mitral stenosis, CKD stage III who was admitted on 11/01/2020 with chest pain concerning for pericarditis.  Assessment & Plan   1.  Acute chest pain/pericarditis -Pleuritic chest pain that is improved with leaning forward.  ESR was elevated.  CRP was elevated. -Did improve on colchicine.  There was some concerns for nausea which has been chronic for over a year.  This was stopped.  She did have recurrent chest pain. -We will put her back on 0.6 mg of colchicine daily. -Coreg will be switched to metoprolol succinate due to drug interaction as discussed by pharmacy -She is on a statin but had a normal left heart catheterization last year.  She had a normal stress test.  I will see a strong need for her to be on a statin.  This also has an interaction with her colchicine.  Given her age and what is more pressing is her chest  pain I think it is okay to hold her statin -She is remained on Coumadin.  If symptoms continue we may need to hold her Coumadin and get her on an aggressive NSAID regimen.  We could also consider steroids.  We will see how she does on colchicine.  She had significant improvement over the weekend.  2.  Permanent A. Fib -Switch her Coreg to metoprolol as above.  Continue Coumadin.  Not wanting a DOAC.  3.  Severe mitral stenosis, calcific -Not a candidate for valve replacement.  This valve cannot be ballooned.  She is euvolemic.  For questions or updates, please contact [place] Please consult [email or web address] for contact info under   Time Spent with Patient: I have spent a total of 35 minutes with patient reviewing hospital notes, telemetry, EKGs, labs and examining the patient as well as establishing an assessment and plan that was discussed with the patient.  > 50% of time was spent in direct patient care.    Signed, Addison Naegeli. Audie Box, [place]  11/07/2020 9:00 AM

## 2020-11-07 NOTE — Progress Notes (Addendum)
Pt c/o significant SOB after ambulating from the bed to the bedside commode. O2 sat remained 98-100% on 3L [place]. HR 80s-90s. Slight crackles auscultated. Pt with leg edema. Pt given time to rest. MD paged. Will continue to monitor.

## 2020-11-07 NOTE — Progress Notes (Signed)
Pt with episode of chest pain and SOB after returning to bed from ambulating to the bedside commode. Pt had taken off the 3L nasal cannula while getting back into bed. Nasal cannula replaced. Vicodin given. EKG performed. MD paged.

## 2020-11-08 ENCOUNTER — Encounter (HOSPITAL_COMMUNITY): Payer: Self-pay | Admitting: Internal Medicine

## 2020-11-08 ENCOUNTER — Inpatient Hospital Stay (HOSPITAL_COMMUNITY): Payer: Medicare Other

## 2020-11-08 ENCOUNTER — Encounter (HOSPITAL_COMMUNITY)
Admission: EM | Disposition: A | Discharge status: Hospice | Payer: Self-pay | Source: Home / Self Care | Attending: Internal Medicine

## 2020-11-08 ENCOUNTER — Inpatient Hospital Stay (HOSPITAL_COMMUNITY): Payer: Medicare Other | Admitting: Certified Registered Nurse Anesthetist

## 2020-11-08 DIAGNOSIS — Q399 Congenital malformation of esophagus, unspecified: Secondary | ICD-10-CM

## 2020-11-08 DIAGNOSIS — K3189 Other diseases of stomach and duodenum: Secondary | ICD-10-CM

## 2020-11-08 DIAGNOSIS — R110 Nausea: Secondary | ICD-10-CM

## 2020-11-08 HISTORY — PX: ESOPHAGOGASTRODUODENOSCOPY (EGD) WITH PROPOFOL: SHX5813

## 2020-11-08 LAB — PROTIME-INR
INR: 2.4 — ABNORMAL HIGH (ref 0.8–1.2)
Prothrombin Time: 25.5 seconds — ABNORMAL HIGH (ref 11.4–15.2)

## 2020-11-08 LAB — CBC
HCT: 35.1 % — ABNORMAL LOW (ref 36.0–46.0)
Hemoglobin: 11.9 g/dL — ABNORMAL LOW (ref 12.0–15.0)
MCH: 33.2 pg (ref 26.0–34.0)
MCHC: 33.9 g/dL (ref 30.0–36.0)
MCV: 98 fL (ref 80.0–100.0)
Platelets: 241 10*3/uL (ref 150–400)
RBC: 3.58 MIL/uL — ABNORMAL LOW (ref 3.87–5.11)
RDW: 13 % (ref 11.5–15.5)
WBC: 10 10*3/uL (ref 4.0–10.5)
nRBC: 0 % (ref 0.0–0.2)

## 2020-11-08 LAB — BASIC METABOLIC PANEL
Anion gap: 9 (ref 5–15)
BUN: 22 mg/dL (ref 8–23)
CO2: 21 mmol/L — ABNORMAL LOW (ref 22–32)
Calcium: 8.7 mg/dL — ABNORMAL LOW (ref 8.9–10.3)
Chloride: 104 mmol/L (ref 98–111)
Creatinine, Ser: 0.94 mg/dL (ref 0.44–1.00)
GFR, Estimated: 58 mL/min — ABNORMAL LOW (ref >60)
Glucose, Bld: 112 mg/dL — ABNORMAL HIGH (ref 70–99)
Potassium: 4.5 mmol/L (ref 3.5–5.1)
Sodium: 134 mmol/L — ABNORMAL LOW (ref 135–145)

## 2020-11-08 SURGERY — ESOPHAGOGASTRODUODENOSCOPY (EGD) WITH PROPOFOL
Anesthesia: Monitor Anesthesia Care

## 2020-11-08 MED ORDER — FUROSEMIDE 10 MG/ML IJ SOLN
20.0000 mg | Freq: Once | INTRAMUSCULAR | Status: AC
  Administered 2020-11-08: 02:00:00 20 mg via INTRAVENOUS
  Filled 2020-11-08: qty 2

## 2020-11-08 MED ORDER — PHENYLEPHRINE 40 MCG/ML (10ML) SYRINGE FOR IV PUSH (FOR BLOOD PRESSURE SUPPORT)
PREFILLED_SYRINGE | INTRAVENOUS | Status: DC | PRN
  Administered 2020-11-08: 120 ug via INTRAVENOUS
  Administered 2020-11-08: 80 ug via INTRAVENOUS
  Administered 2020-11-08: 120 ug via INTRAVENOUS
  Administered 2020-11-08: 80 ug via INTRAVENOUS

## 2020-11-08 MED ORDER — WARFARIN SODIUM 2 MG PO TABS
4.0000 mg | ORAL_TABLET | Freq: Once | ORAL | Status: AC
  Administered 2020-11-08: 17:00:00 4 mg via ORAL
  Filled 2020-11-08: qty 2

## 2020-11-08 MED ORDER — LACTATED RINGERS IV SOLN: Freq: Once | INTRAVENOUS | Status: AC

## 2020-11-08 MED ORDER — FUROSEMIDE 10 MG/ML IJ SOLN
20.0000 mg | Freq: Once | INTRAMUSCULAR | Status: AC
  Administered 2020-11-08: 08:00:00 20 mg via INTRAVENOUS
  Filled 2020-11-08: qty 2

## 2020-11-08 MED ORDER — PROSOURCE PLUS PO LIQD
30.0000 mL | Freq: Two times a day (BID) | ORAL | Status: DC
  Administered 2020-11-09: 09:00:00 30 mL via ORAL
  Filled 2020-11-08 (×4): qty 30

## 2020-11-08 MED ORDER — PROPOFOL 10 MG/ML IV BOLUS
INTRAVENOUS | Status: DC | PRN
  Administered 2020-11-08: 20 mg via INTRAVENOUS

## 2020-11-08 MED ORDER — EPHEDRINE SULFATE-NACL 50-0.9 MG/10ML-% IV SOSY
PREFILLED_SYRINGE | INTRAVENOUS | Status: DC | PRN
  Administered 2020-11-08: 15 mg via INTRAVENOUS
  Administered 2020-11-08: 10 mg via INTRAVENOUS
  Administered 2020-11-08: 5 mg via INTRAVENOUS

## 2020-11-08 MED ORDER — METOPROLOL SUCCINATE ER 50 MG PO TB24
50.0000 mg | ORAL_TABLET | Freq: Every day | ORAL | Status: DC
  Administered 2020-11-08: 08:00:00 50 mg via ORAL
  Filled 2020-11-08: qty 1

## 2020-11-08 MED ORDER — LIDOCAINE 2% (20 MG/ML) 5 ML SYRINGE
INTRAMUSCULAR | Status: DC | PRN
  Administered 2020-11-08: 60 mg via INTRAVENOUS

## 2020-11-08 MED ORDER — PROPOFOL 500 MG/50ML IV EMUL
INTRAVENOUS | Status: DC | PRN
  Administered 2020-11-08: 75 ug/kg/min via INTRAVENOUS

## 2020-11-08 MED ORDER — LACTATED RINGERS IV SOLN: INTRAVENOUS | Status: DC | PRN

## 2020-11-08 SURGICAL SUPPLY — 15 items

## 2020-11-08 NOTE — Transfer of Care (Signed)
Immediate Anesthesia Transfer of Care Note  Patient: [name]  Procedure(s) Performed: ESOPHAGOGASTRODUODENOSCOPY (EGD) WITH PROPOFOL (N/A )  Patient Location: Endoscopy Unit  Anesthesia Type:MAC  Level of Consciousness: awake and drowsy  Airway & Oxygen Therapy: Patient Spontanous Breathing and Patient connected to nasal cannula oxygen  Post-op Assessment: Report given to RN and Post -op Vital signs reviewed and stable  Post vital signs: Reviewed and stable  Last Vitals:  Vitals Value Taken Time  BP    Temp    Pulse 115 11/08/20 1110  Resp 18 11/08/20 1110  SpO2 93 % 11/08/20 1110  Vitals shown include unvalidated device data.  Last Pain:  Vitals:   11/08/20 1027  TempSrc: Oral  PainSc: 0-No pain      Patients Stated Pain Goal: 0 (63/94/32 0037)  Complications: No complications documented.

## 2020-11-08 NOTE — Op Note (Signed)
[facility] Patient Name: [name] Procedure Date : 11/08/2020 MRN: [id number] Attending MD: Gatha Mayer , MD Date of Birth: 01-Jan-1929 CSN: [id number] Age: 86 Admit Type: Inpatient Procedure:                Upper GI endoscopy Indications:              Nausea Providers:                Gatha Mayer, MD, Kary Kos RN, RN, Fransico Setters                            [place], Technician Referring MD:              Medicines:                Propofol per Anesthesia, Monitored Anesthesia Care Complications:            No immediate complications. Estimated Blood Loss:     Estimated blood loss: none. Procedure:                Pre-Anesthesia Assessment:                           - Prior to the procedure, a History and Physical                            was performed, and patient medications and                            allergies were reviewed. The patient's tolerance of                            previous anesthesia was also reviewed. The risks                            and benefits of the procedure and the sedation                            options and risks were discussed with the patient.                            All questions were answered, and informed consent                            was obtained. Prior Anticoagulants: The patient                            last took Coumadin (warfarin) 1 day prior to the                            procedure. ASA Grade Assessment: III - A patient                            with severe systemic disease. After reviewing the  risks and benefits, the patient was deemed in                            satisfactory condition to undergo the procedure.                           After obtaining informed consent, the endoscope was                            passed under direct vision. Throughout the                            procedure, the patient's blood pressure, pulse, and                            oxygen  saturations were monitored continuously. The                            GIF-H190 ([id number] Olympus gastroscope was                            introduced through the mouth, and advanced to the                            second part of duodenum. The upper GI endoscopy was                            accomplished without difficulty. The patient                            tolerated the procedure well. Scope In: Scope Out: Findings:      The examined esophagus was mildly tortuous.      Patchy mildly erythematous mucosa without bleeding was found in the       gastric antrum.      The gastroesophageal flap valve was visualized endoscopically and       classified as Hill Grade III (minimal fold, loose to endoscope, hiatal       hernia likely).      The cardia and gastric fundus were normal on retroflexion.      The examined duodenum was normal. Impression:               - Tortuous esophagus. TYPICAL FOR AGE                           - Erythematous mucosa in the antrum.                           - Gastroesophageal flap valve classified as Hill                            Grade III (minimal fold, loose to endoscope, hiatal                            hernia likely).                           -  Normal examined duodenum.                           - No specimens collected.                           I UPDATED [facility]IE[name] Recommendation:           - Return patient to hospital ward for ongoing care.                           - Cardiac diet [Duration].                           - NOTHING SEEN HERE TO EXPLAIN NAUSEA - ERYTHEMA                            NON-SPECIFIC                           IF PERSISTENT NAUSEA DESPITE TREATMENT OF                            UNDERLYING HEART ISSUES WOULD CONSIDER VERY LOW                            DOSE MIRTAZAPINE STARTING WITH 1/2 A 7.5 MG TABLET                            (3.375 MG) QHS                           GI SIGNING OFF - NO POST-HOSPITAL GI VISIT  NEEDED                           REASONABLE TO KEEP ON PPI Procedure Code(s):        --- Professional ---                           [id number], Esophagogastroduodenoscopy, flexible,                            transoral; diagnostic, including collection of                            specimen(s) by brushing or washing, when performed                            (separate procedure) Diagnosis Code(s):        --- Professional ---                           Q39.9, Congenital malformation of esophagus,                            unspecified  K31.89, Other diseases of stomach and duodenum                           R11.0, Nausea CPT copyright 2019 [employer]. All rights reserved. The codes documented in this report are preliminary and upon coder review may  be revised to meet current compliance requirements. Gatha Mayer, MD 11/08/2020 11:11:34 AM This report has been signed electronically. Number of Addenda: 0

## 2020-11-08 NOTE — Progress Notes (Signed)
   11/08/20 0834  Mobility  Activity Refused mobility (Patient declined d/t being SOB and having a rough night. Prefers any activity regarding mobility in the afternoon)

## 2020-11-08 NOTE — Progress Notes (Signed)
PROGRESS NOTE    [name]  ZYS:[id number] DOB: [birth year] DOA: 11/01/2020 PCP: Laurey Morale, MD   Brief Narrative:  HPI on 11/01/2020 by Dr. Gean Birchwood [name] is a 86 y.o. female with history of A. fib, mitral stenosis, chronic kidney disease stage III, chronic hyponatremia, history of colon cancer, hypothyroidism, anemia presents to the ER because of chest pain.  Patient has been having chest pain for the last 1 week mostly in the center of the chest radiating to the left arm.  Patient did have some dysuria over the last few days and had gone to the urgent care center on Saturday about 4 days ago and was given antibiotics which patient has been taking for last 3 days.  Her dysuria has improved.  But due to worsening chest pain which is pleuritic in nature radiating to her left shoulder patient presents to the ER.  Denies any nausea vomiting diarrhea has some shortness of breath.  Felt weak.  Patient received her COVID-19 booster shot vaccination a month ago exactly.  Interim history Admitted with chest pain likely secondary to pericardial effusion/pericarditis.  Started on colchicine by cardiology. Course complicated by AKI and supratherapeutic INR.  Given continued nausea and poor appetite, gastroenterology consulted, s/p EGD.   Assessment & Plan   Chest pain likely secondary to moderate pericardial effusion/pericarditis/ Pleuritic pain -CT chest no evidence of aortic dissection or aneurysm.  Cardiomegaly with severe biatrial enlargement, moderate sized pericardial effusion.  ESR as well as CRP were elevated. -Cardiology consulted and appreciated -Currently no signs of tamponade -Echocardiogram showed EF of 55 to 60%.  No regional wall motion abnormalities.  Moderate left LVH.  LV diastolic parameters indeterminate.  RV systolic function mildly reduced.  Mitral valve degenerative, mild to moderate MV regurgitation, severe mitral stenosis.  Moderate to severe tricuspid valve  regurgitation.  Severe calcification of aortic valve with trivial regurgitation and mild to moderate aortic valve stenosis. -Cardiology initially added colchicine. Discussed with Dr. Johnsie Cancel, colchicine discontinued 11/06/2020 (as this could be contributing to her nausea and poor appetite however this has been ongoing for >1year) -Continue pain control with IV Dilaudid along with hydrocodone.  -Continue Xanax for anxiety -Discussed with Dr. Audie Box, cardiology, who felt that this was pericarditis and is restarted patient on colchicine given that patient continued to have pleuritic chest pain returns with worsening over the past day.  Severe mitral stenosis/elevated BNP- Acute diastolic CHF  -CT scan shows findings suggestive of heart failure developing pulmonary edema -BNP 672 -Last echocardiogram Mar 30, 2020 shows an EF of 60 to 65%.  LV diastolic function could not be evaluated. -Patient was given 1 dose of IV Lasix on admission  -Overnight, patient became short of breath, chest x-ray obtained showing vascular congestion and probable mild pulmonary edema.  She was given an additional dose of IV Lasix 20 mg -Blood pressures have improved -Cardiology consulted and appreciated-recommended palliative care consult given severity of mitral stenosis -Echocardiogram as above  Acute kidney injury on Chronic kidney disease, stage IIIa with hyperkalemia -As above, patient did receive 1 dose of IV Lasix in the emergency department -Potassium remains elevated at 5.4 (despite Lokelma, potassium continues to be elevated, will reorder 2 doses of Lokelma) -Creatinine peaked to 1.94 (baseline 1.1)- now down to 0.94 -Renal US: Turning of the renal parenchyma bilaterally, can be seen in medical renal disease.  No acute abnormality or obstruction -Urine electrolytes as well as repeat UA -was unremarkable for infection -IVF disocntinued  Chronic hyponatremia -Sodium currently 134 (baseline approximately  130-134) -Patient asymptomatic -Continue to monitor BMP  Chronic anemia -Hemoglobin stable, currently 11.9 -Continue to monitor CBC  Atrial fibrillation, with supratherapeutic INR -Continue Coumadin per pharmacy -Currently patient is tachycardic in the low 100s (106)-pain may also be contributing to this -INR peaked to 6.6, however down to 2.4 today (was given oral vitamin K several days ago)  Essential hypertension -BP stable -Antihypertensive medications have been held due to soft BPs  UTI/asymptomatic bacteriuria -Patient was recently treated for urinary tract infection however currently is asymptomatic -Patient was treated with Keflex as an outpatient -Urine culture returned 10,000 colonies of Pseudomonas aeruginosa  -Will discontinue ceftriaxone as this would not cover Pseudomonas -Repeat UA unremarkable for infection  Hypothyroidism -Continue Synthroid  Thrombocytopenia -Resolved, continue to monitor CBC  Lung opacity -Noted on CT chest, groundglass airspace opacity left upper lobe measuring 6 mm -May be secondary to pulmonary edema. -Follow-up recommendations for repeat CT in 6 to 12 months  Mildly elevated Lactic acid -likely secondary to the above -no infectious etiology   Nausea -Has been ongoing for over a year -Started patient on PPI -Continue antiemetics as needed as well as 2 doses scheduled -Discussed with niece via phone, nausea seems to worsen with movement -Orthostatics were unremarkable -Patient also tells me that she has had a history of vertigo however denies any current dizziness -Continue to monitor -Gastroenterology consulted and appreciated -status post EGD: Tortuous esophagus, typical for age.  Erythematous mucosa in the antrum.  Gastroesophageal flap valve classified as Hill grade 3.  Recommendations were possibly starting low-dose mirtazapine 3.375 mg nightly if her nausea persists despite treating underlying heart issues.  No findings to  explain nausea  Moderate malnutrition -In the context of chronic illness -Patient tells me she has had poor appetite since having Covid over 1 year ago -Nutrition consulted -Continue supplements  -Patient may consider discussing with her PCP use of appetite stimulants although this will take several weeks to see any results  Constipation -Resolved with enema -Suspect multifactorial including poor oral intake, pain medication -Continue Dulcolax as needed  DVT Prophylaxis Coumadin  Code Status: DNR  Family Communication: None at bedside.   Disposition Plan:  Status is: Inpatient  Remains inpatient appropriate because:Ongoing active pain requiring inpatient pain management, Ongoing diagnostic testing needed not appropriate for outpatient work up, IV treatments appropriate due to intensity of illness or inability to take PO and Inpatient level of care appropriate due to severity of illness   Dispo: The patient is from: Home              Anticipated d/c is to: Home              Anticipated d/c date is: 3 days              Patient currently is not medically stable to d/c.  Consultants Cardiology Gastroenterology Palliative care  Procedures  Echocardiogram EGD  Antibiotics   Anti-infectives (From admission, onward)   Start     Dose/Rate Route Frequency Ordered Stop   11/02/20 0500  cefTRIAXone (ROCEPHIN) 1 g in sodium chloride 0.9 % 100 mL IVPB  Status:  Discontinued        1 g 200 mL/hr over 30 Minutes Intravenous Daily 11/02/20 0446 11/04/20 0827      Subjective:   [name] seen and examined today.  Feeling some chest pain overnight and shortness of breath.  Feels better after receiving Lasix and restarting colchicine.  Denies current abdominal pain, dizziness or headache.  Does complain of continued nausea.      Objective:   Vitals:   11/08/20 1110 11/08/20 1120 11/08/20 1130 11/08/20 1149  BP: (!) 119/91 134/82 133/80 127/82  Pulse: (!) 115 (!) 113 95 93   Resp: 17 18 17 16   Temp: (!) 97.3 F (36.3 C)   97.9 F (36.6 C)  TempSrc: Temporal   Oral  SpO2: 94% 92% 94% 95%  Weight:      Height:        Intake/Output Summary (Last 24 hours) at 11/08/2020 1152 Last data filed at 11/08/2020 1052 Gross per 24 hour  Intake 560 ml  Output 1100 ml  Net -540 ml   Filed Weights   11/06/20 0325 11/07/20 0515 11/08/20 0311  Weight: 80.7 kg 82.1 kg 83.7 kg   Exam  General: Well developed, chronically ill-appearing, thin, NAD  HEENT: NCAT, mucous membranes moist.   Cardiovascular: S1 S2 auscultated, 3/6SEM, irregular  Respiratory: Few crackles  Abdomen: Soft, nontender, nondistended, + bowel sounds  Extremities: warm dry without cyanosis clubbing or edema  Neuro: AAOx3, nonfocal  Psych: Appropriate mood and affect  Data Reviewed: I have personally reviewed following labs and imaging studies  CBC: Recent Labs  Lab 11/02/20 0747 11/03/20 0312 11/04/20 0705 11/05/20 0308 11/06/20 0410 11/07/20 0653 11/08/20 0424  WBC 16.9*   < > 10.0 8.5 8.3 8.6 10.0  NEUTROABS 13.6*  --   --   --   --   --   --   HGB 11.8*   < > 11.3* 10.9* 11.8* 10.9* 11.9*  HCT 36.3   < > 35.5* 31.7* 34.7* 32.5* 35.1*  MCV 100.3*   < > 99.4 96.1 96.7 97.3 98.0  PLT 160   < > 166 181 206 215 241   < > = values in this interval not displayed.   Basic Metabolic Panel: Recent Labs  Lab 11/04/20 0233 11/05/20 0308 11/06/20 0410 11/07/20 0653 11/08/20 0424  NA 127* 126* 129* 133* 134*  K 5.4* 4.6 4.1 4.2 4.5  CL 94* 96* 99 103 104  CO2 22 21* 20* 23 21*  GLUCOSE 139* 140* 99 125* 112*  BUN 62* 63* 42* 27* 22  CREATININE 1.94* 1.78* 1.17* 0.88 0.94  CALCIUM 8.6* 8.4* 8.5* 8.3* 8.7*   GFR: Estimated Creatinine Clearance: 45.3 mL/min (by C-G formula based on SCr of 0.94 mg/dL). Liver Function Tests: Recent Labs  Lab 11/01/20 1849 11/02/20 0747 11/07/20 0653  AST 22 23 59*  ALT 16 16 73*  ALKPHOS 94 87 122  BILITOT 3.2* 2.9* 1.8*  PROT 6.9  6.7 5.7*  ALBUMIN 3.6 3.5 2.9*   Recent Labs  Lab 11/01/20 1849  LIPASE 25   No results for input(s): AMMONIA in the last 168 hours. Coagulation Profile: Recent Labs  Lab 11/04/20 0233 11/05/20 0308 11/06/20 0410 11/07/20 0653 11/08/20 0424  INR 4.8* 6.6* 2.8* 2.3* 2.4*   Cardiac Enzymes: No results for input(s): CKTOTAL, CKMB, CKMBINDEX, TROPONINI in the last 168 hours. BNP (last 3 results) No results for input(s): PROBNP in the last 8760 hours. HbA1C: No results for input(s): HGBA1C in the last 72 hours. CBG: No results for input(s): GLUCAP in the last 168 hours. Lipid Profile: No results for input(s): CHOL, HDL, LDLCALC, TRIG, CHOLHDL, LDLDIRECT in the last 72 hours. Thyroid Function Tests: No results for input(s): TSH, T4TOTAL, FREET4, T3FREE, THYROIDAB in the last 72 hours. Anemia Panel: No results for input(s): VITAMINB12,  FOLATE, FERRITIN, TIBC, IRON, RETICCTPCT in the last 72 hours. Urine analysis:    Component Value Date/Time   COLORURINE AMBER (A) 11/04/2020 1727   APPEARANCEUR HAZY (A) 11/04/2020 1727   LABSPEC 1.017 11/04/2020 1727   PHURINE 5.0 11/04/2020 1727   GLUCOSEU NEGATIVE 11/04/2020 1727   GLUCOSEU NEGATIVE 07/15/2008 1258   HGBUR NEGATIVE 11/04/2020 1727   BILIRUBINUR NEGATIVE 11/04/2020 1727   BILIRUBINUR negative 05/25/2020 1430   KETONESUR NEGATIVE 11/04/2020 1727   PROTEINUR NEGATIVE 11/04/2020 1727   UROBILINOGEN 0.2 05/25/2020 1430   UROBILINOGEN 0.2 09/12/2014 1810   NITRITE NEGATIVE 11/04/2020 1727   LEUKOCYTESUR NEGATIVE 11/04/2020 1727   Sepsis Labs: @LABRCNTIP (procalcitonin:4,lacticidven:4)  ) Recent Results (from the past 240 hour(s))  Resp Panel by RT-PCR (Flu A&B, Covid) Nasopharyngeal Swab     Status: None   Collection Time: 11/01/20  7:58 PM   Specimen: Nasopharyngeal Swab; Nasopharyngeal(NP) swabs in vial transport medium  Result Value Ref Range Status   SARS Coronavirus 2 by RT PCR NEGATIVE NEGATIVE Final     Comment: (NOTE) SARS-CoV-2 target nucleic acids are NOT DETECTED.  The SARS-CoV-2 RNA is generally detectable in upper respiratory specimens during the acute phase of infection. The lowest concentration of SARS-CoV-2 viral copies this assay can detect is 138 copies/mL. A negative result does not preclude SARS-Cov-2 infection and should not be used as the sole basis for treatment or other patient management decisions. A negative result may occur with  improper specimen collection/handling, submission of specimen other than nasopharyngeal swab, presence of viral mutation(s) within the areas targeted by this assay, and inadequate number of viral copies(<138 copies/mL). A negative result must be combined with clinical observations, patient history, and epidemiological information. The expected result is Negative.  Fact Sheet for Patients:  EntrepreneurPulse.com.au  Fact Sheet for Healthcare Providers:  IncredibleEmployment.be  This test is no t yet approved or cleared by the [place] FDA and  has been authorized for detection and/or diagnosis of SARS-CoV-2 by FDA under an Emergency Use Authorization (EUA). This EUA will remain  in effect (meaning this test can be used) for the duration of the COVID-19 declaration under Section 564(b)(1) of the Act, 21 U.S.C.section 360bbb-3(b)(1), unless the authorization is terminated  or revoked sooner.       Influenza A by PCR NEGATIVE NEGATIVE Final   Influenza B by PCR NEGATIVE NEGATIVE Final    Comment: (NOTE) The Xpert Xpress SARS-CoV-2/FLU/RSV plus assay is intended as an aid in the diagnosis of influenza from Nasopharyngeal swab specimens and should not be used as a sole basis for treatment. Nasal washings and aspirates are unacceptable for Xpert Xpress SARS-CoV-2/FLU/RSV testing.  Fact Sheet for Patients: EntrepreneurPulse.com.au  Fact Sheet for Healthcare  Providers: IncredibleEmployment.be  This test is not yet approved or cleared by the [place] FDA and has been authorized for detection and/or diagnosis of SARS-CoV-2 by FDA under an Emergency Use Authorization (EUA). This EUA will remain in effect (meaning this test can be used) for the duration of the COVID-19 declaration under Section 564(b)(1) of the Act, 21 U.S.C. section 360bbb-3(b)(1), unless the authorization is terminated or revoked.  Performed at [facility], [place] [address] [place], [place] [address]   Urine Culture     Status: Abnormal   Collection Time: 11/02/20  6:10 AM   Specimen: Urine, Random  Result Value Ref Range Status   Specimen Description URINE, RANDOM  Final   Special Requests   Final    NONE Performed at  [facility], [place] [address] [place], [place] [address]    Culture 10,000 COLONIES/mL PSEUDOMONAS AERUGINOSA (A)  Final   Report Status 11/04/2020 FINAL  Final   Organism ID, Bacteria PSEUDOMONAS AERUGINOSA (A)  Final      Susceptibility   Pseudomonas aeruginosa - MIC*    CEFTAZIDIME <=1 SENSITIVE Sensitive     CIPROFLOXACIN 0.5 SENSITIVE Sensitive     GENTAMICIN <=1 SENSITIVE Sensitive     IMIPENEM 2 SENSITIVE Sensitive     PIP/TAZO <=4 SENSITIVE Sensitive     CEFEPIME 0.5 SENSITIVE Sensitive     * 10,000 COLONIES/mL PSEUDOMONAS AERUGINOSA      Radiology Studies: DG CHEST PORT 1 VIEW  Result Date: 11/08/2020 CLINICAL DATA:  Shortness of breath EXAM: PORTABLE CHEST 1 VIEW COMPARISON:  11/01/2020 FINDINGS: Cardiomegaly, vascular congestion. Layering bilateral effusions with lower lobe airspace opacities, likely edema and/or atelectasis. No acute bony abnormality. IMPRESSION: Cardiomegaly with vascular congestion and probable mild pulmonary edema. Low lung volumes with bibasilar atelectasis. Electronically Signed   By: Rolm Baptise M.D.   On: 11/08/2020 01:04     Scheduled Meds: . [START ON 11/09/2020] (feeding  supplement) PROSource Plus  30 mL Oral BID BM  . colchicine  0.6 mg Oral Daily  . ketorolac  1 drop Right Eye BID  . levothyroxine  100 mcg Oral Q0600  . metoprolol succinate  50 mg Oral Daily  . multivitamin with minerals  1 tablet Oral Daily  . ondansetron  4 mg Oral Q12H  . pantoprazole  40 mg Oral Daily  . pneumococcal 23 valent vaccine  0.5 mL Intramuscular Tomorrow-1000  . polyethylene glycol powder  1 Container Oral Once  . polyvinyl alcohol  1 drop Both Eyes BID  . vitamin B-12  1,000 mcg Oral Daily  . warfarin  4 mg Oral ONCE-1600  . Warfarin - Pharmacist Dosing Inpatient   Does not apply q1600   Continuous Infusions:    LOS: 7 days   Time Spent in minutes   45 minutes  Elenora Hawbaker D.O. on 11/08/2020 at 11:52 AM  Between 7am to 7pm - Please see pager noted on [email or web address]  After 7pm go to [email or web address]  And look for the night coverage person covering for me after hours  [facility] Office  [phone]

## 2020-11-08 NOTE — Progress Notes (Signed)
Cardiology Progress Note  Patient ID: [name] MRN: [id number] DOB: Jan 05, 1929 Date of Encounter: 11/08/2020  Primary Cardiologist: Jenkins Rouge, MD  Subjective   Chief Complaint: Shortness of breath overnight.  Apparently was on IV fluids.  HPI: SOB overnight. IVF cut off. IV diuresis given. Chest pain improved.   ROS:  All other ROS reviewed and negative. Pertinent positives noted in the HPI.     Inpatient Medications  Scheduled Meds: . colchicine  0.6 mg Oral Daily  . feeding supplement  237 mL Oral TID BM  . furosemide  20 mg Intravenous Once  . ketorolac  1 drop Right Eye BID  . levothyroxine  100 mcg Oral Q0600  . metoprolol succinate  25 mg Oral Daily  . multivitamin with minerals  1 tablet Oral Daily  . ondansetron  4 mg Oral Q12H  . pantoprazole  40 mg Oral Daily  . pneumococcal 23 valent vaccine  0.5 mL Intramuscular Tomorrow-1000  . polyethylene glycol powder  1 Container Oral Once  . polyvinyl alcohol  1 drop Both Eyes BID  . vitamin B-12  1,000 mcg Oral Daily  . warfarin  4 mg Oral ONCE-1600  . Warfarin - Pharmacist Dosing Inpatient   Does not apply q1600   Continuous Infusions:  PRN Meds: acetaminophen, ALPRAZolam, bisacodyl, HYDROcodone-acetaminophen, HYDROmorphone (DILAUDID) injection, ondansetron (ZOFRAN) IV   Vital Signs   Vitals:   11/07/20 2002 11/08/20 0010 11/08/20 0311 11/08/20 0726  BP: 105/84 108/78 135/73 113/73  Pulse: 83 100 92 75  Resp: 17 17 15 16   Temp: 98 F (36.7 C) (!) 97.4 F (36.3 C) (!) 97.4 F (36.3 C) 97.6 F (36.4 C)  TempSrc: Oral Oral Oral Oral  SpO2: 98% 95% 98% 100%  Weight:   83.7 kg   Height:        Intake/Output Summary (Last 24 hours) at 11/08/2020 0757 Last data filed at 11/08/2020 0314 Gross per 24 hour  Intake 760 ml  Output 700 ml  Net 60 ml   Last 3 Weights 11/08/2020 11/07/2020 11/06/2020  Weight (lbs) 184 lb 8 oz 180 lb 14.4 oz 178 lb  Weight (kg) 83.689 kg 82.056 kg 80.74 kg       Telemetry  Overnight telemetry shows Afib 70s, which I personally reviewed.   ECG  The most recent ECG shows Afib 83 bpm, which I personally reviewed.   Physical Exam   Vitals:   11/07/20 2002 11/08/20 0010 11/08/20 0311 11/08/20 0726  BP: 105/84 108/78 135/73 113/73  Pulse: 83 100 92 75  Resp: 17 17 15 16   Temp: 98 F (36.7 C) (!) 97.4 F (36.3 C) (!) 97.4 F (36.3 C) 97.6 F (36.4 C)  TempSrc: Oral Oral Oral Oral  SpO2: 98% 95% 98% 100%  Weight:   83.7 kg   Height:         Intake/Output Summary (Last 24 hours) at 11/08/2020 0757 Last data filed at 11/08/2020 0314 Gross per 24 hour  Intake 760 ml  Output 700 ml  Net 60 ml    Last 3 Weights 11/08/2020 11/07/2020 11/06/2020  Weight (lbs) 184 lb 8 oz 180 lb 14.4 oz 178 lb  Weight (kg) 83.689 kg 82.056 kg 80.74 kg    Body mass index is 25.73 kg/m.  General: Well nourished, well developed, in no acute distress Head: Atraumatic, normal size  Eyes: PEERLA, EOMI  Neck: Supple, no JVD Endocrine: No thryomegaly Cardiac: Normal S1, S2; 2/6 HSM, diastolic rumble  Lungs: +crackles  Abd: Soft, nontender, no hepatomegaly  Ext: No edema, pulses 2+ Musculoskeletal: No deformities, BUE and BLE strength normal and equal Skin: Warm and dry, no rashes   Neuro: Alert and oriented to person, place, time, and situation, CNII-XII grossly intact, no focal deficits  Psych: Normal mood and affect   Labs  High Sensitivity Troponin:   Recent Labs  Lab 11/01/20 1649 11/01/20 1849 11/02/20 0041  TROPONINIHS 9 10 13      Cardiac EnzymesNo results for input(s): TROPONINI in the last 168 hours. No results for input(s): TROPIPOC in the last 168 hours.  Chemistry Recent Labs  Lab 11/01/20 1849 11/01/20 2018 11/02/20 0747 11/03/20 0312 11/06/20 0410 11/07/20 0653 11/08/20 0424  NA  --    < > 128*   < > 129* 133* 134*  K  --    < > 5.2*   < > 4.1 4.2 4.5  CL  --    < > 95*   < > 99 103 104  CO2  --   --  19*   < > 20* 23 21*   GLUCOSE  --    < > 117*   < > 99 125* 112*  BUN  --    < > 31*   < > 42* 27* 22  CREATININE  --    < > 1.10*   < > 1.17* 0.88 0.94  CALCIUM  --   --  9.3   < > 8.5* 8.3* 8.7*  PROT 6.9  --  6.7  --   --  5.7*  --   ALBUMIN 3.6  --  3.5  --   --  2.9*  --   AST 22  --  23  --   --  59*  --   ALT 16  --  16  --   --  73*  --   ALKPHOS 94  --  87  --   --  122  --   BILITOT 3.2*  --  2.9*  --   --  1.8*  --   GFRNONAA  --   --  48*   < > 45* >60 58*  ANIONGAP  --   --  14   < > 10 7 9    < > = values in this interval not displayed.    Hematology Recent Labs  Lab 11/06/20 0410 11/07/20 0653 11/08/20 0424  WBC 8.3 8.6 10.0  RBC 3.59* 3.34* 3.58*  HGB 11.8* 10.9* 11.9*  HCT 34.7* 32.5* 35.1*  MCV 96.7 97.3 98.0  MCH 32.9 32.6 33.2  MCHC 34.0 33.5 33.9  RDW 12.7 13.0 13.0  PLT 206 215 241   BNP Recent Labs  Lab 11/01/20 1827  BNP 672.0*    DDimer No results for input(s): DDIMER in the last 168 hours.   Radiology  DG CHEST PORT 1 VIEW  Result Date: 11/08/2020 CLINICAL DATA:  Shortness of breath EXAM: PORTABLE CHEST 1 VIEW COMPARISON:  11/01/2020 FINDINGS: Cardiomegaly, vascular congestion. Layering bilateral effusions with lower lobe airspace opacities, likely edema and/or atelectasis. No acute bony abnormality. IMPRESSION: Cardiomegaly with vascular congestion and probable mild pulmonary edema. Low lung volumes with bibasilar atelectasis. Electronically Signed   By: Rolm Baptise M.D.   On: 11/08/2020 01:04    Cardiac Studies  TTE 11/02/2020  1. Left ventricular ejection fraction, by estimation, is 55 to 60%. The  left ventricle has normal function. The left ventricle has no regional  wall motion abnormalities. There  is moderate left ventricular hypertrophy.  Left ventricular diastolic  parameters are indeterminate. There is the interventricular septum is  flattened in systole and diastole, consistent with right ventricular  pressure and volume overload.  2. Right  ventricular systolic function is moderately reduced. The right  ventricular size is moderately enlarged. There is moderately elevated  pulmonary artery systolic pressure. The estimated right ventricular  systolic pressure is 27.7 mmHg.  3. Left atrial size was severely dilated.  4. Right atrial size was mildly dilated.  5. The mitral valve is degenerative. Mild to moderate mitral valve  regurgitation. Severe mitral stenosis. Severe mitral annular  calcification. MG 19 mmHg at HR 117 bpm, MVA 0.7 cm^2 by continuity  equation  6. Tricuspid valve regurgitation is moderate to severe.  7. The aortic valve is tricuspid. There is severe calcifcation of the  aortic valve. Aortic valve regurgitation is trivial. Mild to moderate  aortic valve stenosis. Mild AS by gradients (Vmax 2.5 m/s, MG 14 mmHG),  likely due to low SV (SVi 21 cc/m^2). AS  is moderate by AVA (1.1 cm^2) and DI (0.31). Suspect moderate AS.  8. The inferior vena cava is dilated in size with <50% respiratory  variability, suggesting right atrial pressure of 15 mmHg.  9. Moderate pericardial effusion measuring 1.5cm adjacent to LV posterior  wall. No evidence of tamponade. IVC is fixed/dilated, but no RA/RV  collapse or significant mitral inflow respiratory variation seen.   Patient Profile  [name] is a 86 y.o. female with A. fib that is permanent, severe mitral stenosis, CKD stage III who was admitted on 11/01/2020 with chest pain concerning for pericarditis.  Assessment & Plan   1. Acute chest pain/pericarditis -Pleuritic chest pain.  Sed rate and CRP were elevated.  Colchicine was stopped due to nausea.  We rechallenged her.  Seems to be doing well.  We will continue colchicine 0.6 mg daily. -coreg switched to metoprolol succinate due to drug interactions  -continue with supportive care for now  2. Severe MS -Echo with severe mitral stenosis.  Was given fluids overnight.  That has been stopped.  I will give an  additional 20 mg of IV Lasix today. -I will increase her metoprolol succinate to 50 mg a day to help slow her heart rate down. -Overall this is end-stage mitral valve disease.  She has an end-stage cardiomyopathy with profound right heart failure.  This will not get better.  Palliative care and hospice may be a good idea for her.  She is very tenuous and clearly had an issue with fluids overnight.  3. Permanent Afib -Permanent A. fib.  Increase metoprolol succinate 50 mg a day.  She is on Coumadin.  We should be cautious in the setting of pericarditis.  For questions or updates, please contact [place] Please consult [email or web address] for contact info under   Time Spent with Patient: I have spent a total of 25 minutes with patient reviewing hospital notes, telemetry, EKGs, labs and examining the patient as well as establishing an assessment and plan that was discussed with the patient.  > 50% of time was spent in direct patient care.    Signed, Addison Naegeli. Audie Box, [place]  11/08/2020 7:57 AM

## 2020-11-08 NOTE — Progress Notes (Signed)
[place] for Coumadin Indication: atrial fibrillation  Allergies  Allergen Reactions  . Codeine Nausea Only    Patient Measurements: Height: 5\' 11"  (180.3 cm) Weight: 83.7 kg (184 lb 8 oz) IBW/kg (Calculated) : 70.8  Vital Signs: Temp: 97.6 F (36.4 C) (12/14 0726) Temp Source: Oral (12/14 0726) BP: 113/73 (12/14 0726) Pulse Rate: 75 (12/14 0726)  Labs: Recent Labs    11/06/20 0410 11/07/20 0653 11/08/20 0424  HGB 11.8* 10.9* 11.9*  HCT 34.7* 32.5* 35.1*  PLT 206 215 241  LABPROT 29.0* 24.3* 25.5*  INR 2.8* 2.3* 2.4*  CREATININE 1.17* 0.88 0.94    Estimated Creatinine Clearance: 45.3 mL/min (by C-G formula based on SCr of 0.94 mg/dL).   Assessment: 86 yo female presented on 11/01/2020 with chest pain, found to be secondary to moderate pericardial effusion.  Patient is on Coumadin 106m PO daily PTA for Afib.  INR was 2.8 on admission but increased to 6.6 on 11/05/20 while on home regimen, possibly due to reduced PO intake.  Vitamin K 2.5mg  by mouth was given 12/11 AM.   INR therapeutic at 2.4 today; no bleeding reported.   LFTs mildly elevated - she was taking Tylenol BID PTA.   Noted plan for diagnostic EGD.  Goal of Therapy:  INR 2-3 Monitor platelets by anticoagulation protocol: Yes   Plan:  Repeat Coumadin 4mg  PO today Daily PT / INR MD to hold Coumadin if needed for further procedures   Celese Banner D. Mina Marble, PharmD, BCPS, [place] 11/08/2020, 7:37 AM

## 2020-11-08 NOTE — Progress Notes (Addendum)
Floor coverage overnight event  Notified by RN that patient complained of shortness of breath when ambulating from the bed to the bedside commode.  Her sats remained stable at 98-100% on 3 L, heart rate in the 80s to 90s.  RN reported lower extremity edema and crackles on auscultation of the lungs.    Patient has a history of severe mitral stenosis and diastolic CHF.  Stat chest x-ray was done and showing cardiomegaly with vascular congestion and probable mild pulmonary edema.  Renal function now improved and creatinine down to 0.8.  Blood pressure stable.  IV Lasix 20 mg x 1 ordered.  Please reassess in a.m. and order additional doses of Lasix as needed.  Addendum: Also, IV fluids discontinued given volume overload.

## 2020-11-08 NOTE — Progress Notes (Signed)
°   11/08/20 1939  Assess: MEWS Score  Temp 97.8 F (36.6 C)  BP 119/87  Pulse Rate (!) 111  Resp 20  Level of Consciousness Alert  SpO2 93 %  O2 Device Room Air  Patient Activity (if Appropriate) In bed  Assess: MEWS Score  MEWS Temp 0  MEWS Systolic 0  MEWS Pulse 2  MEWS RR 0  MEWS LOC 0  MEWS Score 2  MEWS Score Color Yellow  Assess: if the MEWS score is Yellow or Red  Were vital signs taken at a resting state? Yes  Focused Assessment No change from prior assessment  Early Detection of Sepsis Score *See Row Information* Low  MEWS guidelines implemented *See Row Information* No, vital signs rechecked  Treat  MEWS Interventions Administered scheduled meds/treatments;Escalated (See documentation below)  Pain Scale 0-10  Pain Score 4  Pain Type Chronic pain  Pain Location Chest  Pain Orientation Left  Pain Descriptors / Indicators Sharp  Pain Frequency Constant  Pain Onset On-going  Patients Stated Pain Goal 3  Pain Intervention(s) Relaxation;Rest  Multiple Pain Sites No  Escalate  MEWS: Escalate Yellow: discuss with charge nurse/RN and consider discussing with provider and RRT  Notify: Charge Nurse/RN  Name of Charge Nurse/RN Notified Danae Chen  Date Charge Nurse/RN Notified 11/08/20  Time Charge Nurse/RN Notified 1945  Document  Patient Outcome Stabilized after interventions  Progress note created (see row info) Yes

## 2020-11-08 NOTE — Anesthesia Preprocedure Evaluation (Signed)
Anesthesia Evaluation  Patient identified by MRN, date of birth, ID band Patient awake    Reviewed: Allergy & Precautions, H&P , NPO status , Patient's Chart, lab work & pertinent test results  Airway Mallampati: II   Neck ROM: full    Dental   Pulmonary    breath sounds clear to auscultation       Cardiovascular hypertension, + Past MI, + Peripheral Vascular Disease and +CHF  + Valvular Problems/Murmurs  Rhythm:regular Rate:Normal  Severe mitral stenosis   Neuro/Psych PSYCHIATRIC DISORDERS Anxiety  Neuromuscular disease    GI/Hepatic   Endo/Other  Hypothyroidism   Renal/GU      Musculoskeletal  (+) Arthritis ,   Abdominal   Peds  Hematology   Anesthesia Other Findings   Reproductive/Obstetrics                             Anesthesia Physical Anesthesia Plan  ASA: III  Anesthesia Plan: MAC   Post-op Pain Management:    Induction: Intravenous  PONV Risk Score and Plan: 2 and Propofol infusion and Treatment may vary due to age or medical condition  Airway Management Planned: Nasal Cannula  Additional Equipment:   Intra-op Plan:   Post-operative Plan:   Informed Consent: I have reviewed the patients History and Physical, chart, labs and discussed the procedure including the risks, benefits and alternatives for the proposed anesthesia with the patient or authorized representative who has indicated his/her understanding and acceptance.       Plan Discussed with: CRNA, Anesthesiologist and Surgeon  Anesthesia Plan Comments:         Anesthesia Quick Evaluation

## 2020-11-08 NOTE — Interval H&P Note (Signed)
History and Physical Interval Note:  11/08/2020 10:37 AM  [name]  has presented today for surgery, with the diagnosis of Nausea, vomiting.  Pericarditis..  The various methods of treatment have been discussed with the patient and family. After consideration of risks, benefits and other options for treatment, the patient has consented to  Procedure(s): ESOPHAGOGASTRODUODENOSCOPY (EGD) WITH PROPOFOL (N/A) as a surgical intervention.  The patient's history has been reviewed, patient examined, no change in status, stable for surgery.  I have reviewed the patient's chart and labs.  Questions were answered to the patient's satisfaction.     Silvano Rusk

## 2020-11-08 NOTE — Progress Notes (Signed)
Physical Therapy Treatment Patient Details Name: [name] MRN: [id number] DOB: 03/07/1929 Today's Date: 11/08/2020    History of Present Illness Patient is a 86 y/o female who presents with SOB and chest pain. CT-moderate pericardial effusion with CHF/pulmonary edema. EKG- A-fib rate controlled. PMH includes A-fib, CA, HTN, mitral valve disorder, OA, peripheral neuropathy, PVD, spinal surgery.    PT Comments    Pt progressing slowly towards her physical therapy goals. Continues to be very tachycardic both at rest and with mobility; 107 HR at rest, up to 130-160's with limited mobility with max HR 171 bpm. Pt ambulating to bathroom and back to the chair with a walker and min guard assist, SpO2 97-100% on RA. Able to participate in low level seated exercises. Will continue to follow to promote mobility as tolerated.    Follow Up Recommendations  Home health PT;Supervision - Intermittent     Equipment Recommendations  None recommended by PT    Recommendations for Other Services       Precautions / Restrictions Precautions Precautions: Fall;Other (comment) Precaution Comments: watch HR Restrictions Weight Bearing Restrictions: No    Mobility  Bed Mobility Overal bed mobility: Modified Independent                Transfers Overall transfer level: Needs assistance Equipment used: Rolling walker (2 wheeled) Transfers: Sit to/from Stand Sit to Stand: Min guard         General transfer comment: Min guard from edge of bed and toilet. Increased time/effort  Ambulation/Gait Ambulation/Gait assistance: Min guard Gait Distance (Feet): 30 Feet (10", 20") Assistive device: Rolling walker (2 wheeled) Gait Pattern/deviations: Step-through pattern;Narrow base of support;Shuffle     General Gait Details: Min guard for safety, increased trunk flexion   Stairs             Wheelchair Mobility    Modified Rankin (Stroke Patients Only)       Balance Overall  balance assessment: Needs assistance Sitting-balance support: Feet supported;No upper extremity supported Sitting balance-Leahy Scale: Good     Standing balance support: No upper extremity supported;During functional activity Standing balance-Leahy Scale: Fair Standing balance comment: washing hands at sink without physical assist                            Cognition Arousal/Alertness: Awake/alert Behavior During Therapy: WFL for tasks assessed/performed Overall Cognitive Status: Within Functional Limits for tasks assessed                                        Exercises General Exercises - Lower Extremity Long Arc Quad: Both;Seated;15 reps Hip Flexion/Marching: Both;10 reps;Seated Heel Raises: Both;10 reps;Seated    General Comments        Pertinent Vitals/Pain Pain Assessment: Faces Faces Pain Scale: No hurt    Home Living                      Prior Function            PT Goals (current goals can now be found in the care plan section) Acute Rehab PT Goals Patient Stated Goal: go home Potential to Achieve Goals: Good Progress towards PT goals: Progressing toward goals    Frequency    Min 3X/week      PT Plan Current plan remains appropriate    Co-evaluation  AM-PAC PT "6 Clicks" Mobility   Outcome Measure  Help needed turning from your back to your side while in a flat bed without using bedrails?: None Help needed moving from lying on your back to sitting on the side of a flat bed without using bedrails?: None Help needed moving to and from a bed to a chair (including a wheelchair)?: A Little Help needed standing up from a chair using your arms (e.g., wheelchair or bedside chair)?: A Little Help needed to walk in hospital room?: A Little Help needed climbing 3-5 steps with a railing? : A Lot 6 Click Score: 19    End of Session   Activity Tolerance: Patient tolerated treatment well Patient left:  in chair;with call bell/phone within reach;with family/visitor present Nurse Communication: Mobility status PT Visit Diagnosis: Difficulty in walking, not elsewhere classified (R26.2);Muscle weakness (generalized) (M62.81)     Time: 8381-8403 PT Time Calculation (min) (ACUTE ONLY): 26 min  Charges:  $Therapeutic Activity: 23-37 mins                     Wyona Almas, PT, DPT Acute Rehabilitation Services Pager [phone] Office [phone]    Deno Etienne 11/08/2020, 4:34 PM

## 2020-11-08 NOTE — Anesthesia Procedure Notes (Signed)
Procedure Name: MAC Date/Time: 11/08/2020 10:41 AM Performed by: Trinna Post., CRNA Pre-anesthesia Checklist: Patient identified, Emergency Drugs available, Suction available, Patient being monitored and Timeout performed Patient Re-evaluated:Patient Re-evaluated prior to induction Oxygen Delivery Method: Nasal cannula Preoxygenation: Pre-oxygenation with 100% oxygen Induction Type: IV induction Placement Confirmation: positive ETCO2

## 2020-11-08 NOTE — Progress Notes (Signed)
Received pt post EGD, alert and oriented, no complaints at this time. Pt just wanted to rest for now.

## 2020-11-08 NOTE — Progress Notes (Signed)
Nutrition Follow-up  DOCUMENTATION CODES:   Non-severe (moderate) malnutrition in context of chronic illness  INTERVENTION:   -D/c Ensure Enlive po TID, each supplement provides 350 kcal and 20 grams of protein, due to poor acceptance -Continue MVI with minerals daily -30 ml Prosource Plus BID, each supplement provides 100 kcals and 15 grams protein -Magic cup TID with meals, each supplement provides 290 kcal and 9 grams of protein  NUTRITION DIAGNOSIS:   Moderate Malnutrition related to chronic illness (mitral stenosis) as evidenced by mild fat depletion,mild muscle depletion,moderate muscle depletion.  Ongoing  GOAL:   Patient will meet greater than or equal to 90% of their needs  Progressing  MONITOR:   PO intake,Supplement acceptance,Labs,Weight trends,Skin,I & O's  REASON FOR ASSESSMENT:   Malnutrition Screening Tool    ASSESSMENT:   [name] is a 86 y.o. female with history of A. fib, mitral stenosis, chronic kidney disease stage III, chronic hyponatremia, history of colon cancer, hypothyroidism, anemia presents to the ER because of chest pain.  Patient has been having chest pain for the last 1 week mostly in the center of the chest radiating to the left arm.  Patient did have some dysuria over the last few days and had gone to the urgent care center on Saturday about 4 days ago and was given antibiotics which patient has been taking for last 3 days.  Her dysuria has improved.  But due to worsening chest pain which is pleuritic in nature radiating to her left shoulder patient presents to the ER.  Denies any nausea vomiting diarrhea has some shortness of breath.  Felt weak.  Patient received her COVID-19 booster shot vaccination a month ago exactly.  12/10- Echocardiogram reveals severe mitral stenosis with at least moderate mitral regurgitation, severe tricuspid regurgitation  Reviewed I/O's: +60 ml x 24 hours and +1.6 L since admission  UOP: 700 ml x 24  hours  Per GI notes, plan for EGD today to exclude severe erosive esophagitis or Candida esophagitis, peptic ulcer disease or neoplasia.   Pt unavailable at time of attempted contact.   Per chart review, pt still with nausea, however, intake has improved. Documented meal completions 25-90%. Pt is consistently refusing Ensure Enlive supplements.   Per cardiology notes, pt ver tenuous and may benefit from hospice/ palliative care.   Medications reviewed and include vitamin B-12.   Labs reviewed: Na: 134.   Diet Order:   Diet Order            Diet NPO time specified  Diet effective midnight                 EDUCATION NEEDS:   Education needs have been addressed  Skin:  Skin Assessment: Reviewed RN Assessment  Last BM:  11/06/20  Height:   Ht Readings from Last 1 Encounters:  11/02/20 5\' 11"  (1.803 m)    Weight:   Wt Readings from Last 1 Encounters:  11/08/20 83.7 kg    Ideal Body Weight:  75.5 kg  BMI:  Body mass index is 25.73 kg/m.  Estimated Nutritional Needs:   Kcal:  1700-1900  Protein:  85-100 grams  Fluid:  > 1.7 L    Loistine Chance, RD, LDN, [place] Registered Dietitian II Certified Diabetes Care and Education Specialist Please refer to [facility] for RD and/or RD on-call/weekend/after hours pager

## 2020-11-09 ENCOUNTER — Encounter (HOSPITAL_COMMUNITY): Payer: Self-pay | Admitting: Internal Medicine

## 2020-11-09 DIAGNOSIS — Z515 Encounter for palliative care: Secondary | ICD-10-CM

## 2020-11-09 DIAGNOSIS — Z7189 Other specified counseling: Secondary | ICD-10-CM

## 2020-11-09 DIAGNOSIS — E739 Lactose intolerance, unspecified: Secondary | ICD-10-CM

## 2020-11-09 DIAGNOSIS — I3139 Other pericardial effusion (noninflammatory): Secondary | ICD-10-CM

## 2020-11-09 DIAGNOSIS — N179 Acute kidney failure, unspecified: Secondary | ICD-10-CM

## 2020-11-09 DIAGNOSIS — I300 Acute nonspecific idiopathic pericarditis: Secondary | ICD-10-CM

## 2020-11-09 DIAGNOSIS — I4821 Permanent atrial fibrillation: Secondary | ICD-10-CM

## 2020-11-09 LAB — BASIC METABOLIC PANEL
Anion gap: 13 (ref 5–15)
BUN: 25 mg/dL — ABNORMAL HIGH (ref 8–23)
CO2: 18 mmol/L — ABNORMAL LOW (ref 22–32)
Calcium: 9 mg/dL (ref 8.9–10.3)
Chloride: 103 mmol/L (ref 98–111)
Creatinine, Ser: 1.09 mg/dL — ABNORMAL HIGH (ref 0.44–1.00)
GFR, Estimated: 49 mL/min — ABNORMAL LOW (ref >60)
Glucose, Bld: 117 mg/dL — ABNORMAL HIGH (ref 70–99)
Potassium: 4.8 mmol/L (ref 3.5–5.1)
Sodium: 134 mmol/L — ABNORMAL LOW (ref 135–145)

## 2020-11-09 LAB — PROTIME-INR
INR: 2.9 — ABNORMAL HIGH (ref 0.8–1.2)
Prothrombin Time: 29.4 seconds — ABNORMAL HIGH (ref 11.4–15.2)

## 2020-11-09 LAB — CBC
HCT: 38.1 % (ref 36.0–46.0)
Hemoglobin: 12.1 g/dL (ref 12.0–15.0)
MCH: 31.3 pg (ref 26.0–34.0)
MCHC: 31.8 g/dL (ref 30.0–36.0)
MCV: 98.4 fL (ref 80.0–100.0)
Platelets: 251 10*3/uL (ref 150–400)
RBC: 3.87 MIL/uL (ref 3.87–5.11)
RDW: 13.2 % (ref 11.5–15.5)
WBC: 11 10*3/uL — ABNORMAL HIGH (ref 4.0–10.5)
nRBC: 0 % (ref 0.0–0.2)

## 2020-11-09 MED ORDER — WARFARIN SODIUM 2.5 MG PO TABS
2.5000 mg | ORAL_TABLET | Freq: Once | ORAL | Status: AC
  Administered 2020-11-09: 17:00:00 2.5 mg via ORAL
  Filled 2020-11-09: qty 1

## 2020-11-09 MED ORDER — HYDROCODONE-ACETAMINOPHEN 5-325 MG PO TABS
1.0000 | ORAL_TABLET | ORAL | Status: DC | PRN
  Administered 2020-11-09: 2 via ORAL
  Filled 2020-11-09 (×2): qty 1

## 2020-11-09 MED ORDER — METOPROLOL SUCCINATE ER 100 MG PO TB24
100.0000 mg | ORAL_TABLET | Freq: Every day | ORAL | Status: DC
  Administered 2020-11-09: 09:00:00 100 mg via ORAL
  Filled 2020-11-09 (×2): qty 1

## 2020-11-09 MED ORDER — ONDANSETRON HCL 4 MG/2ML IJ SOLN
4.0000 mg | Freq: Four times a day (QID) | INTRAMUSCULAR | Status: DC | PRN
  Administered 2020-11-09 – 2020-11-10 (×2): 4 mg via INTRAVENOUS
  Filled 2020-11-09 (×2): qty 2

## 2020-11-09 MED ORDER — MORPHINE SULFATE (CONCENTRATE) 10 MG/0.5ML PO SOLN
2.6000 mg | ORAL | Status: DC | PRN
  Administered 2020-11-09 – 2020-11-10 (×2): 5 mg via ORAL
  Filled 2020-11-09 (×2): qty 0.5

## 2020-11-09 NOTE — Progress Notes (Signed)
[facility])  Referral received for hospice services once patient discharges.  Patient will discharge to [facility] later this week.  Spoke with niece [name], confirmed interest, and provided support.  No DME needs identified at this time.   Please send completed DNR when pt discharges.  Venia Carbon RN, BSN, [facility] Liaison

## 2020-11-09 NOTE — Anesthesia Postprocedure Evaluation (Signed)
Anesthesia Post Note  Patient: [name]  Procedure(s) Performed: ESOPHAGOGASTRODUODENOSCOPY (EGD) WITH PROPOFOL (N/A )     Patient location during evaluation: Endoscopy Anesthesia Type: MAC Level of consciousness: awake and alert Pain management: pain level controlled Vital Signs Assessment: post-procedure vital signs reviewed and stable Respiratory status: spontaneous breathing, nonlabored ventilation, respiratory function stable and patient connected to nasal cannula oxygen Cardiovascular status: blood pressure returned to baseline and stable Postop Assessment: no apparent nausea or vomiting Anesthetic complications: no   No complications documented.  Last Vitals:  Vitals:   11/08/20 1939 11/09/20 0446  BP: 119/87 110/81  Pulse: (!) 111 (!) 105  Resp: 20 20  Temp: 36.6 C (!) 36.3 C  SpO2: 93% 94%    Last Pain:  Vitals:   11/09/20 0655  TempSrc:   PainSc: Asleep   Pain Goal: Patients Stated Pain Goal: 3 (11/09/20 0625)                 Albertha Ghee S

## 2020-11-09 NOTE — Progress Notes (Signed)
Had a discussion with Ms. Quest niece Mordecai Maes) at the bedside.  We discussed that she has severe mitral stenosis which is likely mixed rheumatic and calcific.  She has pulmonary hypertension as well as RV failure from this.  Measures to control her atrial fibrillation weaken her right heart.  Also fluid changes have been difficult to manage.  She had an EGD done yesterday which was unremarkable for a source of her nausea.  Overall I feel her symptoms are related to her severe mitral stenosis.  She is had rather difficult to control symptoms and treating one problem causes another.  They have met with palliative care and are on board with proceeding to hospice.  They have requested more aggressive control of her nausea and pain medications while she is in-house.  Moving forward, I would recommend to continue her beta-blocker to try to control her heart rate.  This will help with symptomatic relief from her severe mitral stenosis.  Regarding her Coumadin, this can be stopped at the discretion of palliative care.  I see no need for continuing this therapy if she has towards hospice.  This is just a recommendation in the focus should be on comfort and quality of life as she transitions to hospice.  [place] T. Audie Box, [place]  [address] [place], [place] [address] [phone]  3:08 PM

## 2020-11-09 NOTE — Progress Notes (Signed)
[facility] PROGRESS NOTE    Progress Note  [name]  BWI:[id number] DOB: 1929-08-18 DOA: 11/01/2020 PCP: Laurey Morale, MD     Brief Narrative:   [name] is an 86 y.o. female past medical history of atrial fibrillation, mitral stenosis, chronic kidney disease stage III, chronic hyponatremia history of colon cancer comes into the ED complaining of chest pain that has been going on for a week.  CT of the chest showed no evidence of dissection or aneurysm, 2D echo was done that showed moderate pericardial effusion with severe bilateral atrial enlargement, and no signs of physiologic tamponade, cardiology was consulted recommended to continue colchicine and eventually held but eventually resume.  on  echo she is has severe mitral stenosis and palliative care was consulted and awaiting recommendation  Assessment/Plan:   Acute pericarditis/pericardial effusion: Patient relates he still having chest pain, he was restarted back on his colchicine to be doing well. He was switched to oral metoprolol succinate due to drug interaction. Continue further management per cardiology.  Mitral stenosis and severe right-sided heart failure: Imaging showed pulmonary edema with a BNP of 672 last echocardiogram showed an EF of 60% he was given one dose of IV Lasix with minimal improvement Palliative Care was consulted given his severe mitral stenosis.  Acute kidney injury on chronic kidney disease stage III/hyperkalemia: Despite treatment her potassium continues to be elevated, her creatinine was  1.9 which is improved to 0.9. Renal ultrasound showed chronic renal disease.  Chronic hyponatremia: Currently asymptomatic.  Chronic normocytic anemia: No signs of bleeding continue to monitor intermittently.  Atrial fibrillation with a supratherapeutic INR on admission: Continue Coumadin per pharmacy, her INR this morning is 2.9.  Essential hypertension: Blood pressure currently stable  continue current regimen.  Asymptomatic bacteriuria: She completed treatment with Keflex as an outpatient.  Hypothyroidism: Continue Synthroid.  Chronic thrombocytopenia: Resolved.  Abnormal CT scan: Noted.  Nausea: Started on a PPI now resolved.  EGD showed erythematous mucosa in the atrium gastroesophageal valve flap grade 3 no obvious signs of would explain her nausea.  Moderate protein caloric malnutrition: Poor appetite for over a year continue nutritional supplementation.  Constipation: Started on Dulcolax it has now resolved.  Likely due to narcotics. We will have her on a regimen.    DVT prophylaxis: coumadin Family Communication:Neice Status is: Inpatient  Remains inpatient appropriate because:Hemodynamically unstable   Dispo: The patient is from: Home              Anticipated d/c is to: SNF              Anticipated d/c date is: > 3 days              Patient currently is not medically stable to d/c.        Code Status:     Code Status Orders  (From admission, onward)         Start     Ordered   11/01/20 2236  Do not attempt resuscitation (DNR)  Continuous       Question Answer Comment  In the event of cardiac or respiratory ARREST Do not call a "code blue"   In the event of cardiac or respiratory ARREST Do not perform Intubation, CPR, defibrillation or ACLS   In the event of cardiac or respiratory ARREST Use medication by any route, position, wound care, and other measures to relive pain and suffering. May use oxygen, suction and manual treatment of airway obstruction  as needed for comfort.      11/01/20 2235        Code Status History    Date Active Date Inactive Code Status Order ID Comments User Context   11/01/2020 1915 11/01/2020 2235 DNR [id number]  Drenda Freeze, MD ED   02/12/2019 1126 02/13/2019 1807 DNR [id number]  Cheryln Manly, NP Inpatient   02/12/2019 0706 02/12/2019 1126 DNR [id number]  Ward, Delice Bison, DO ED   01/16/2018  1226 01/17/2018 1921 DNR [id number]  Lady Deutscher, MD ED   04/02/2017 1352 04/04/2017 1344 DNR [id number]  Caren Griffins, MD Inpatient   02/17/2016 1819 02/21/2016 1347 DNR [id number]  Thurnell Lose, MD Inpatient   12/15/2015 1056 12/18/2015 1705 DNR [id number]  Samella Parr, NP Inpatient   Advance Care Planning Activity    Advance Directive Documentation   Flowsheet Row Most Recent Value  Type of Advance Directive Healthcare Power of Attorney  Pre-existing out of facility DNR order (yellow form or pink MOST form) --  "MOST" Form in Place? --        IV Access:    Peripheral IV   Procedures and diagnostic studies:   DG CHEST PORT 1 VIEW  Result Date: 11/08/2020 CLINICAL DATA:  Shortness of breath EXAM: PORTABLE CHEST 1 VIEW COMPARISON:  11/01/2020 FINDINGS: Cardiomegaly, vascular congestion. Layering bilateral effusions with lower lobe airspace opacities, likely edema and/or atelectasis. No acute bony abnormality. IMPRESSION: Cardiomegaly with vascular congestion and probable mild pulmonary edema. Low lung volumes with bibasilar atelectasis. Electronically Signed   By: Rolm Baptise M.D.   On: 11/08/2020 01:04     Medical Consultants:    None.  Anti-Infectives:   none  Subjective:    [name] she relates her pain continues to bother her intermittently especially at night.  Objective:    Vitals:   11/08/20 1149 11/08/20 1939 11/09/20 0441 11/09/20 0446  BP: 127/82 119/87  110/81  Pulse: 93 (!) 111  (!) 105  Resp: 16 20  20   Temp: 97.9 F (36.6 C) 97.8 F (36.6 C)  (!) 97.4 F (36.3 C)  TempSrc: Oral Oral  Oral  SpO2: 95% 93%  94%  Weight:   83 kg   Height:       SpO2: 94 % O2 Flow Rate (L/min): 2 L/min   Intake/Output Summary (Last 24 hours) at 11/09/2020 0814 Last data filed at 11/09/2020 0300 Gross per 24 hour  Intake 680 ml  Output 1501 ml  Net -821 ml   Filed Weights   11/07/20 0515 11/08/20 0311 11/09/20 0441  Weight: 82.1 kg  83.7 kg 83 kg    Exam: General exam: In no acute distress. Respiratory system: Good air movement and clear to auscultation. Cardiovascular system: S1 & S2 heard, RRR. No JVD. Gastrointestinal system: Abdomen is nondistended, soft and nontender.  Extremities: No pedal edema. Skin: No rashes, lesions or ulcers Psychiatry: Judgement and insight appear normal. Mood & affect appropriate.   Data Reviewed:    Labs: Basic Metabolic Panel: Recent Labs  Lab 11/05/20 0308 11/06/20 0410 11/07/20 0653 11/08/20 0424 11/09/20 0310  NA 126* 129* 133* 134* 134*  K 4.6 4.1 4.2 4.5 4.8  CL 96* 99 103 104 103  CO2 21* 20* 23 21* 18*  GLUCOSE 140* 99 125* 112* 117*  BUN 63* 42* 27* 22 25*  CREATININE 1.78* 1.17* 0.88 0.94 1.09*  CALCIUM 8.4* 8.5* 8.3* 8.7* 9.0   GFR Estimated Creatinine Clearance: 39.1 mL/min (A) (  by C-G formula based on SCr of 1.09 mg/dL (H)). Liver Function Tests: Recent Labs  Lab 11/07/20 0653  AST 59*  ALT 73*  ALKPHOS 122  BILITOT 1.8*  PROT 5.7*  ALBUMIN 2.9*   No results for input(s): LIPASE, AMYLASE in the last 168 hours. No results for input(s): AMMONIA in the last 168 hours. Coagulation profile Recent Labs  Lab 11/05/20 0308 11/06/20 0410 11/07/20 0653 11/08/20 0424 11/09/20 0310  INR 6.6* 2.8* 2.3* 2.4* 2.9*   COVID-19 Labs  No results for input(s): DDIMER, FERRITIN, LDH, CRP in the last 72 hours.  Lab Results  Component Value Date   SARSCOV2NAA NEGATIVE 11/01/2020   SARSCOV2NAA Detected (A) 10/09/2019    CBC: Recent Labs  Lab 11/05/20 0308 11/06/20 0410 11/07/20 0653 11/08/20 0424 11/09/20 0310  WBC 8.5 8.3 8.6 10.0 11.0*  HGB 10.9* 11.8* 10.9* 11.9* 12.1  HCT 31.7* 34.7* 32.5* 35.1* 38.1  MCV 96.1 96.7 97.3 98.0 98.4  PLT 181 206 215 241 251   Cardiac Enzymes: No results for input(s): CKTOTAL, CKMB, CKMBINDEX, TROPONINI in the last 168 hours. BNP (last 3 results) No results for input(s): PROBNP in the last 8760  hours. CBG: No results for input(s): GLUCAP in the last 168 hours. D-Dimer: No results for input(s): DDIMER in the last 72 hours. Hgb A1c: No results for input(s): HGBA1C in the last 72 hours. Lipid Profile: No results for input(s): CHOL, HDL, LDLCALC, TRIG, CHOLHDL, LDLDIRECT in the last 72 hours. Thyroid function studies: No results for input(s): TSH, T4TOTAL, T3FREE, THYROIDAB in the last 72 hours.  Invalid input(s): FREET3 Anemia work up: No results for input(s): VITAMINB12, FOLATE, FERRITIN, TIBC, IRON, RETICCTPCT in the last 72 hours. Sepsis Labs: Recent Labs  Lab 11/02/20 1005 11/03/20 0312 11/06/20 0410 11/07/20 0653 11/08/20 0424 11/09/20 0310  WBC  --    < > 8.3 8.6 10.0 11.0*  LATICACIDVEN 2.0*  --   --   --   --   --    < > = values in this interval not displayed.   Microbiology Recent Results (from the past 240 hour(s))  Resp Panel by RT-PCR (Flu A&B, Covid) Nasopharyngeal Swab     Status: None   Collection Time: 11/01/20  7:58 PM   Specimen: Nasopharyngeal Swab; Nasopharyngeal(NP) swabs in vial transport medium  Result Value Ref Range Status   SARS Coronavirus 2 by RT PCR NEGATIVE NEGATIVE Final    Comment: (NOTE) SARS-CoV-2 target nucleic acids are NOT DETECTED.  The SARS-CoV-2 RNA is generally detectable in upper respiratory specimens during the acute phase of infection. The lowest concentration of SARS-CoV-2 viral copies this assay can detect is 138 copies/mL. A negative result does not preclude SARS-Cov-2 infection and should not be used as the sole basis for treatment or other patient management decisions. A negative result may occur with  improper specimen collection/handling, submission of specimen other than nasopharyngeal swab, presence of viral mutation(s) within the areas targeted by this assay, and inadequate number of viral copies(<138 copies/mL). A negative result must be combined with clinical observations, patient history, and  epidemiological information. The expected result is Negative.  Fact Sheet for Patients:  EntrepreneurPulse.com.au  Fact Sheet for Healthcare Providers:  IncredibleEmployment.be  This test is no t yet approved or cleared by the [place] FDA and  has been authorized for detection and/or diagnosis of SARS-CoV-2 by FDA under an Emergency Use Authorization (EUA). This EUA will remain  in effect (meaning this test can be used) for  the duration of the COVID-19 declaration under Section 564(b)(1) of the Act, 21 U.S.C.section 360bbb-3(b)(1), unless the authorization is terminated  or revoked sooner.       Influenza A by PCR NEGATIVE NEGATIVE Final   Influenza B by PCR NEGATIVE NEGATIVE Final    Comment: (NOTE) The Xpert Xpress SARS-CoV-2/FLU/RSV plus assay is intended as an aid in the diagnosis of influenza from Nasopharyngeal swab specimens and should not be used as a sole basis for treatment. Nasal washings and aspirates are unacceptable for Xpert Xpress SARS-CoV-2/FLU/RSV testing.  Fact Sheet for Patients: EntrepreneurPulse.com.au  Fact Sheet for Healthcare Providers: IncredibleEmployment.be  This test is not yet approved or cleared by the [place] FDA and has been authorized for detection and/or diagnosis of SARS-CoV-2 by FDA under an Emergency Use Authorization (EUA). This EUA will remain in effect (meaning this test can be used) for the duration of the COVID-19 declaration under Section 564(b)(1) of the Act, 21 U.S.C. section 360bbb-3(b)(1), unless the authorization is terminated or revoked.  Performed at [facility], [place] [address], [place], [place] [address]   Urine Culture     Status: Abnormal   Collection Time: 11/02/20  6:10 AM   Specimen: Urine, Random  Result Value Ref Range Status   Specimen Description URINE, RANDOM  Final   Special Requests   Final    NONE Performed at  [facility], [place] [address], [place], [place] [address]    Culture 10,000 COLONIES/mL PSEUDOMONAS AERUGINOSA (A)  Final   Report Status 11/04/2020 FINAL  Final   Organism ID, Bacteria PSEUDOMONAS AERUGINOSA (A)  Final      Susceptibility   Pseudomonas aeruginosa - MIC*    CEFTAZIDIME <=1 SENSITIVE Sensitive     CIPROFLOXACIN 0.5 SENSITIVE Sensitive     GENTAMICIN <=1 SENSITIVE Sensitive     IMIPENEM 2 SENSITIVE Sensitive     PIP/TAZO <=4 SENSITIVE Sensitive     CEFEPIME 0.5 SENSITIVE Sensitive     * 10,000 COLONIES/mL PSEUDOMONAS AERUGINOSA     Medications:   . (feeding supplement) PROSource Plus  30 mL Oral BID BM  . colchicine  0.6 mg Oral Daily  . ketorolac  1 drop Right Eye BID  . levothyroxine  100 mcg Oral Q0600  . metoprolol succinate  100 mg Oral Daily  . multivitamin with minerals  1 tablet Oral Daily  . ondansetron  4 mg Oral Q12H  . pantoprazole  40 mg Oral Daily  . pneumococcal 23 valent vaccine  0.5 mL Intramuscular Tomorrow-1000  . polyethylene glycol powder  1 Container Oral Once  . polyvinyl alcohol  1 drop Both Eyes BID  . vitamin B-12  1,000 mcg Oral Daily  . warfarin  2.5 mg Oral ONCE-1600  . Warfarin - Pharmacist Dosing Inpatient   Does not apply q1600   Continuous Infusions:    LOS: 8 days   Charlynne Cousins  [facility]  11/09/2020, 8:14 AM

## 2020-11-09 NOTE — Progress Notes (Signed)
OT Cancellation Note  Patient Details Name: [name] MRN: [id number] DOB: [birth year]   Cancelled Treatment:    Reason Eval/Treat Not Completed: Medical issues which prohibited therapy;Other (comment). Hold OT for today, pt with chest pains last night with not much improvement (palliative has been consulted and may go hospice)  Britt Bottom 11/09/2020, 3:15 PM

## 2020-11-09 NOTE — Progress Notes (Signed)
Cardiology Progress Note  Patient ID: [name] MRN: [id number] DOB: Jan 07, 1929 Date of Encounter: 11/09/2020  Primary Cardiologist: Jenkins Rouge, MD  Subjective   Chief Complaint: Chest pain overnight.  Tachycardia noted.  Not feeling well.  HPI: Continues to have intermittent sharp chest pain.  EGD without source of nausea.  Symptoms are not improving.  Palliative care to see today.  ROS:  All other ROS reviewed and negative. Pertinent positives noted in the HPI.     Inpatient Medications  Scheduled Meds: . (feeding supplement) PROSource Plus  30 mL Oral BID BM  . colchicine  0.6 mg Oral Daily  . ketorolac  1 drop Right Eye BID  . levothyroxine  100 mcg Oral Q0600  . metoprolol succinate  100 mg Oral Daily  . multivitamin with minerals  1 tablet Oral Daily  . ondansetron  4 mg Oral Q12H  . pantoprazole  40 mg Oral Daily  . pneumococcal 23 valent vaccine  0.5 mL Intramuscular Tomorrow-1000  . polyethylene glycol powder  1 Container Oral Once  . polyvinyl alcohol  1 drop Both Eyes BID  . vitamin B-12  1,000 mcg Oral Daily  . warfarin  2.5 mg Oral ONCE-1600  . Warfarin - Pharmacist Dosing Inpatient   Does not apply q1600   Continuous Infusions:  PRN Meds: acetaminophen, ALPRAZolam, bisacodyl, HYDROcodone-acetaminophen, HYDROmorphone (DILAUDID) injection, ondansetron (ZOFRAN) IV   Vital Signs   Vitals:   11/08/20 1149 11/08/20 1939 11/09/20 0441 11/09/20 0446  BP: 127/82 119/87  110/81  Pulse: 93 (!) 111  (!) 105  Resp: _0 Temp: 97.9 F (36.6 C) 97.8 F (36.6 C)  (!) 97.4 F (36.3 C)  TempSrc: Oral Oral  Oral  SpO2: 95% 93%  94%  Weight:   83 kg   Height:        Intake/Output Summary (Last 24 hours) at 11/09/2020 0920 Last data filed at 11/09/2020 0300 Gross per 24 hour  Intake 680 ml  Output 1501 ml  Net -821 ml   Last 3 Weights 11/09/2020 11/08/2020 11/07/2020  Weight (lbs) 182 lb 15.7 oz 184 lb 8 oz 180 lb 14.4 oz  Weight (kg) 83 kg  83.689 kg 82.056 kg      Telemetry  Overnight telemetry shows A. fib with RVR with heart rates in the 120s, which I personally reviewed.   Physical Exam   Vitals:   11/08/20 1149 11/08/20 1939 11/09/20 0441 11/09/20 0446  BP: 127/82 119/87  110/81  Pulse: 93 (!) 111  (!) 105  Resp: _1 Temp: 97.9 F (36.6 C) 97.8 F (36.6 C)  (!) 97.4 F (36.3 C)  TempSrc: Oral Oral  Oral  SpO2: 95% 93%  94%  Weight:   83 kg   Height:         Intake/Output Summary (Last 24 hours) at 11/09/2020 0920 Last data filed at 11/09/2020 0300 Gross per 24 hour  Intake 680 ml  Output 1501 ml  Net -821 ml    Last 3 Weights 11/09/2020 11/08/2020 11/07/2020  Weight (lbs) 182 lb 15.7 oz 184 lb 8 oz 180 lb 14.4 oz  Weight (kg) 83 kg 83.689 kg 82.056 kg    Body mass index is 25.52 kg/m.  General: Ill-appearing Head: Atraumatic, normal size  Eyes: PEERLA, EOMI  Neck: Supple, +JVD Endocrine: No thryomegaly Cardiac: Normal S1, S2; irregular rhythm, harsh systolic murmur, diastolic rumble noted Lungs: Diminished breath sounds at the lung bases Abd: Soft,  nontender, no hepatomegaly  Ext: No edema, pulses 2+ Musculoskeletal: No deformities, BUE and BLE strength normal and equal Skin: Warm and dry, no rashes   Neuro: Alert and oriented to person, place, time, and situation, CNII-XII grossly intact, no focal deficits  Psych: Normal mood and affect   Labs  High Sensitivity Troponin:   Recent Labs  Lab 11/01/20 1649 11/01/20 1849 11/02/20 0041  TROPONINIHS _0 Cardiac EnzymesNo results for input(s): TROPONINI in the last 168 hours. No results for input(s): TROPIPOC in the last 168 hours.  Chemistry Recent Labs  Lab 11/07/20 0653 11/08/20 0424 11/09/20 0310  NA 133* 134* 134*  K 4.2 4.5 4.8  CL 103 104 103  CO2 23 21* 18*  GLUCOSE 125* 112* 117*  BUN 27* 22 25*  CREATININE 0.88 0.94 1.09*  CALCIUM 8.3* 8.7* 9.0  PROT 5.7*  --   --   ALBUMIN 2.9*  --   --   AST 59*  --    --   ALT 73*  --   --   ALKPHOS 122  --   --   BILITOT 1.8*  --   --   GFRNONAA >60 58* 49*  ANIONGAP _1 Hematology Recent Labs  Lab 11/07/20 0653 11/08/20 0424 11/09/20 0310  WBC 8.6 10.0 11.0*  RBC 3.34* 3.58* 3.87  HGB 10.9* 11.9* 12.1  HCT 32.5* 35.1* 38.1  MCV 97.3 98.0 98.4  MCH 32.6 33.2 31.3  MCHC 33.5 33.9 31.8  RDW 13.0 13.0 13.2  PLT 215 241 251   BNPNo results for input(s): BNP, PROBNP in the last 168 hours.  DDimer No results for input(s): DDIMER in the last 168 hours.   Radiology  DG CHEST PORT 1 VIEW  Result Date: 11/08/2020 CLINICAL DATA:  Shortness of breath EXAM: PORTABLE CHEST 1 VIEW COMPARISON:  11/01/2020 FINDINGS: Cardiomegaly, vascular congestion. Layering bilateral effusions with lower lobe airspace opacities, likely edema and/or atelectasis. No acute bony abnormality. IMPRESSION: Cardiomegaly with vascular congestion and probable mild pulmonary edema. Low lung volumes with bibasilar atelectasis. Electronically Signed   By: Rolm Baptise M.D.   On: 11/08/2020 01:04    Cardiac Studies  TTE 11/02/2020 1. Left ventricular ejection fraction, by estimation, is 55 to 60%. The  left ventricle has normal function. The left ventricle has no regional  wall motion abnormalities. There is moderate left ventricular hypertrophy.  Left ventricular diastolic  parameters are indeterminate. There is the interventricular septum is  flattened in systole and diastole, consistent with right ventricular  pressure and volume overload.  2. Right ventricular systolic function is moderately reduced. The right  ventricular size is moderately enlarged. There is moderately elevated  pulmonary artery systolic pressure. The estimated right ventricular  systolic pressure is 03.0 mmHg.  3. Left atrial size was severely dilated.  4. Right atrial size was mildly dilated.  5. The mitral valve is degenerative. Mild to moderate mitral valve  regurgitation. Severe mitral  stenosis. Severe mitral annular  calcification. MG 19 mmHg at HR 117 bpm, MVA 0.7 cm^2 by continuity  equation  6. Tricuspid valve regurgitation is moderate to severe.  7. The aortic valve is tricuspid. There is severe calcifcation of the  aortic valve. Aortic valve regurgitation is trivial. Mild to moderate  aortic valve stenosis. Mild AS by gradients (Vmax 2.5 m/s, MG 14 mmHG),  likely due to low SV (SVi 21 cc/m^2). AS  is moderate by AVA (1.1 cm^2)  and DI (0.31). Suspect moderate AS.  8. The inferior vena cava is dilated in size with <50% respiratory  variability, suggesting right atrial pressure of 15 mmHg.  9. Moderate pericardial effusion measuring 1.5cm adjacent to LV posterior  wall. No evidence of tamponade. IVC is fixed/dilated, but no RA/RV  collapse or significant mitral inflow respiratory variation seen.   Patient Profile  LIDA BERKERY a 86 y.o.femalewith A. fib that is permanent, severe mitral stenosis, CKD stage III who was admitted on 11/01/2020 with chest pain concerning for pericarditis.  Assessment & Plan   1.  Acute chest pain/pericarditis -Still having pleuritic chest pain intermittently.  ESR and CRP were elevated.  Colchicine added.  Continue colchicine 0.6 mg daily. -Metoprolol succinate added due to interaction with Coreg -We will continue with intermittent pain management for now. -She is in a difficult spot of high-dose NSAIDs would help but I think she would be high bleeding risk.  Was noted to have some erythema on EGD.  She also has severe mitral stenosis and A. fib.  Extremely high risk for thromboembolic events.  2.  Severe mitral stenosis/moderate to severe pulmonary pretension/RV failure -She has end-stage mitral valve disease.  Not a candidate for treatment.  No surgical or percutaneous options for her. -Mainstay of treatment is heart rate control.  I have increased her metoprolol succinate 100 mg a day. -I do fear that she is not going to get  better.  She has had several issues and we have tried to treat each 1 but they cause other issues with her heart.  Her heart will not get better.  I have recommended palliative care.  I think she is approaching appropriateness for hospice care.  We cannot manage her symptoms and we have an underlying heart condition that will not improve.  3.  Permanent A. Fib -Increase metoprolol succinate to 100 mg a day.  Continue Coumadin.  For questions or updates, please contact [place] Please consult [email or web address] for contact info under   Time Spent with Patient: I have spent a total of 25 minutes with patient reviewing hospital notes, telemetry, EKGs, labs and examining the patient as well as establishing an assessment and plan that was discussed with the patient.  > 50% of time was spent in direct patient care.    Signed, Addison Naegeli. Audie Box, [employer]  11/09/2020 9:20 AM

## 2020-11-09 NOTE — Progress Notes (Signed)
   11/09/20 0830  Mobility  Activity Refused mobility;Contraindicated/medical hold Patient lethargic from medications

## 2020-11-09 NOTE — NC FL2 (Addendum)
[place] MEDICAID FL2 LEVEL OF CARE SCREENING TOOL     IDENTIFICATION  Patient Name: [name] Birthdate: 04/22/1929 Sex: female Admission Date (Current Location): 11/01/2020  [facility] [place] Number:  Herbalist and Address:  [facility], [place] [address], [place], [place] [address]      Provider Number: [id number]  Attending Physician Name and Address:  Charlynne Cousins, MD  Relative Name and Phone Number:  Mordecai Maes (Niece)   [phone] [facility])    Current Level of Care: Hospital Recommended Level of Care: [place] Prior Approval Number:    Date Approved/Denied:   PASRR Number:    [id number]  Discharge Plan: SNF    Current Diagnoses: Patient Active Problem List   Diagnosis Date Noted  . Pericardial effusion 11/09/2020  . Nausea without vomiting   . Malnutrition of moderate degree 11/04/2020  . Pericarditis   . Acute CHF (congestive heart failure) ([place]) 11/01/2020  . Chest pain 11/01/2020  . Cystoid macular edema of right eye 07/04/2020  . Right epiretinal membrane 07/04/2020  . Early stage nonexudative age-related macular degeneration of both eyes 07/04/2020  . Hyponatremia 05/25/2020  . COVID-19 virus infection 12/08/2019  . NSTEMI (non-ST elevated myocardial infarction) ([place]) 02/12/2019  . Chest pain of uncertain etiology 62/26/3335  . Sore throat 10/30/2018  . Fatigue 10/30/2018  . Dysuria 10/30/2018  . Hypoxemia 01/16/2018  . Lower respiratory infection 01/16/2018  . History of colon cancer in adulthood 01/16/2018  . Diastolic congestive heart failure with preserved left ventricular function, NYHA class 2 ([place]) 01/16/2018  . SBO (small bowel obstruction) ([place]) 04/02/2017  . CAP (community acquired pneumonia) 02/24/2016  . Fever blister 02/24/2016  . Bilateral pneumonia 02/17/2016  . Acute respiratory failure with hypercapnia ([place]) 02/17/2016  . Acute diastolic heart failure ([place])   .  Dehydration 12/15/2015  . Sepsis ([place]) 12/15/2015  . Enteritis 12/15/2015  . Diarrhea   . Charcot's joint of left foot 11/24/2015  . Vasculitis ([place]) 03/11/2015  . Aortic stenosis 09/14/2013  . Long term current use of anticoagulant 01/09/2011  . COLONIC POLYPS 10/29/2010  . DIVERTICULOSIS OF COLON 10/29/2010  . ABNORMAL CV (STRESS) TEST 04/20/2010  . Acute pericarditis 04/17/2010  . B12 deficiency 12/23/2008  . IMPAIRED GLUCOSE TOLERANCE 12/23/2008  . PERIPHERAL NEUROPATHY 06/22/2008  . SPLENIC INFARCTION 02/20/2008  . Anxiety state 02/20/2008  . Moderate mitral regurgitation 02/20/2008  . PERIPHERAL VASCULAR DISEASE 02/20/2008  . Venous (peripheral) insufficiency 02/20/2008  . URINARY TRACT INFECTION 02/20/2008  . Osteoarthritis 02/20/2008  . OSTEOPOROSIS 02/20/2008  . UTERINE CANCER, HX OF 02/20/2008  . Malignant neoplasm of colon ([place]) 01/13/2008  . Hypothyroidism 01/13/2008  . Hyperlipemia 01/13/2008  . Essential hypertension 01/13/2008  . ATRIAL FIBRILLATION 01/13/2008  . LOW BACK PAIN, CHRONIC 01/13/2008    Orientation RESPIRATION BLADDER Height & Weight     Self,Time,Situation,Place  Normal Incontinent Weight: 182 lb 15.7 oz (83 kg) Height:  5\' 11"  (180.3 cm)  BEHAVIORAL SYMPTOMS/MOOD NEUROLOGICAL BOWEL NUTRITION STATUS      Continent Diet (See d/c summary)  AMBULATORY STATUS COMMUNICATION OF NEEDS Skin   Extensive Assist Verbally Normal                       Personal Care Assistance Level of Assistance  Bathing,Feeding,Dressing Bathing Assistance: Maximum assistance Feeding assistance: Limited assistance Dressing Assistance: Maximum assistance     Functional Limitations Info  Sight,Hearing,Speech Sight Info: Adequate Hearing Info: Adequate Speech  Info: Adequate    SPECIAL CARE FACTORS FREQUENCY   (Hospice)                    Contractures Contractures Info: Not present    Additional Factors Info  Psychotropic Code Status Info:  DNR Allergies Info: Codeine Psychotropic Info: See d/c summary         Current Medications (11/09/2020):  This is the current hospital active medication list Current Facility-Administered Medications  Medication Dose Route Frequency Provider Last Rate Last Admin  . (feeding supplement) PROSource Plus liquid 30 mL  30 mL Oral BID BM Gatha Mayer, MD   30 mL at 11/09/20 0922  . acetaminophen (TYLENOL) tablet 1,000 mg  1,000 mg Oral Q6H PRN Gatha Mayer, MD   1,000 mg at 11/08/20 1928  . ALPRAZolam Duanne Moron) tablet 0.125 mg  0.125 mg Oral TID PRN Gatha Mayer, MD   0.125 mg at 11/09/20 1224  . bisacodyl (DULCOLAX) EC tablet 5 mg  5 mg Oral Daily PRN Gatha Mayer, MD      . colchicine tablet 0.6 mg  0.6 mg Oral Daily Gatha Mayer, MD   0.6 mg at 11/09/20 7829  . HYDROcodone-acetaminophen (NORCO/VICODIN) 5-325 MG per tablet 1-2 tablet  1-2 tablet Oral Q4H PRN Gatha Mayer, MD   1 tablet at 11/09/20 1224  . HYDROmorphone (DILAUDID) injection 0.25-0.5 mg  0.25-0.5 mg Intravenous Q3H PRN Gatha Mayer, MD   0.5 mg at 11/09/20 5621  . ketorolac (ACULAR) 0.5 % ophthalmic solution 1 drop  1 drop Right Eye BID Gatha Mayer, MD   1 drop at 11/09/20 1208  . levothyroxine (SYNTHROID) tablet 100 mcg  100 mcg Oral Q0600 Gatha Mayer, MD   100 mcg at 11/09/20 0502  . metoprolol succinate (TOPROL-XL) 24 hr tablet 100 mg  100 mg Oral Daily Geralynn Rile, MD   100 mg at 11/09/20 3086  . multivitamin with minerals tablet 1 tablet  1 tablet Oral Daily Gatha Mayer, MD   1 tablet at 11/09/20 5784  . ondansetron (ZOFRAN) injection 4 mg  4 mg Intravenous Q6H PRN Gatha Mayer, MD   4 mg at 11/09/20 1440  . ondansetron (ZOFRAN) tablet 4 mg  4 mg Oral Q12H Gatha Mayer, MD   4 mg at 11/09/20 6962  . pantoprazole (PROTONIX) EC tablet 40 mg  40 mg Oral Daily Gatha Mayer, MD   40 mg at 11/09/20 9528  . pneumococcal 23 valent vaccine (PNEUMOVAX-23) injection 0.5 mL  0.5 mL  Intramuscular Tomorrow-1000 Mikhail, [place], DO      . polyethylene glycol powder (GLYCOLAX/MIRALAX) container 255 g  1 Container Oral Once Gatha Mayer, MD      . polyvinyl alcohol (LIQUIFILM TEARS) 1.4 % ophthalmic solution 1 drop  1 drop Both Eyes BID Gatha Mayer, MD   1 drop at 11/09/20 1208  . vitamin B-12 (CYANOCOBALAMIN) tablet 1,000 mcg  1,000 mcg Oral Daily Gatha Mayer, MD   1,000 mcg at 11/09/20 4132  . warfarin (COUMADIN) tablet 2.5 mg  2.5 mg Oral ONCE-1600 Einar Grad, [facility]      . Warfarin - Pharmacist Dosing Inpatient   Does not apply q1600 Gatha Mayer, MD   Given at 11/06/20 1737     Discharge Medications: Please see discharge summary for a list of discharge medications.  Relevant Imaging Results:  Relevant Lab Results:   Additional Information  SSN [place] Tkeyah Burkman, LCSW

## 2020-11-09 NOTE — Progress Notes (Signed)
Pt with episode of severe chest pain. PRN Dilaudid given. 2L [place] reapplied. Will continue to monitor.

## 2020-11-09 NOTE — TOC Progression Note (Signed)
Transition of Care [facility]) - Progression Note    Patient Details  Name: [name] MRN: [id number] Date of Birth: 02-27-1929  Transition of Care [facility]) CM/SW [place], [place] Phone Number: 11/09/2020, 4:14 PM  Clinical Narrative:     CSW spoke with admissions at [facility]. Informed private and semi-private beds available for private pay if pt is clinically approved. Private pay requires 30 days up front payment.   Private room is $304/day and $9120 up front.   Semi Private room is $220/day and $6600 up front  Pt is vaccinated so can have 2 visitors at a time who could switch out. Semi-private room could affect visitation policy.   CSW met with pt Niece [name] bedside and informed of prices and policy. [name] states that family is wanting pt to move in to SNF with hospice instead of home. They prefer [facility] which [facility] also uses.   FL2 complete and faxed in hub   Expected Discharge Plan: [place] Barriers to Discharge: Continued Medical Work up  Expected Discharge Plan and Services Expected Discharge Plan: [place]   Discharge Planning Services: CM Consult Post Acute Care Choice: [place] arrangements for the past 2 months: [place]                   DME Agency: NA       HH Arranged: PT,OT [place]: [place] Date [place]: 11/04/20 Time [place]: 8719 Representative spoke with at [place]: [place] ([place]) Interventions    Readmission Risk Interventions Readmission Risk Prevention Plan 11/04/2020  Transportation Screening Complete  PCP or Specialist Appt within 3-5 Days Complete  HRI or [place] Complete  Social Work Consult for [place] Planning/Counseling Complete  Palliative Care Screening Complete  Medication Review Press photographer) Complete  Some recent data might be hidden

## 2020-11-09 NOTE — Consult Note (Signed)
Consultation Note Date: 11/09/2020   Patient Name: [name]  DOB: [birth year]  MRN: [id number]  Age / Sex: 86 y.o., female  PCP: Laurey Morale, MD Referring Physician: Aileen Fass, Tammi Klippel, MD  Reason for Consultation: Establishing goals of care and Psychosocial/spiritual support  HPI/Patient Profile: 86 y.o. female  with past medical history of atrial fibrillation, mitral stenosis, chronic kidney disease stage III, chronic hyponatremia history of colon cancer admitted on 11/01/2020 with acute pericarditis/pericardial effusion, mitral stenosis and severe right-sided heart failure.   Clinical Assessment and Goals of Care: I have reviewed medical records including EPIC notes, labs and imaging, examined the patient and met at bedside with niece/HC POA, Mordecai Maes, to discuss diagnosis prognosis, GOC, EOL wishes, disposition and options.  Mrs. Kirt is sitting up in bed.  She greets me making and mostly keeping eye contact.  She appears acutely/chronically ill and somewhat frail.  She is alert and oriented, able to make her needs known.  Her niece/healthcare power of attorney, Mordecai Maes is at bedside.   I introduced Palliative Medicine as specialized medical care for people living with serious illness. It focuses on providing relief from the symptoms and stress of a serious illness.  Both Mrs. Chaisson and [name] are familiar with hospice care, Mrs. Swiney husband and [name]'s parents received palliative and hospice care.  As far as functional and nutritional status, Mrs. Steinhauser lives in [facility] independent [facility].  She manages her own IADLs, but [name] delivers her groceries.  Initially, Mrs. Schrimpf and [name] ask about short-term rehab.  After reviewing PT evaluation, it seems that Mrs. Gillott is not qualified for short-term rehab.  After much discussion, Mrs. Croston endorses that she does not feel that she has the strength for  short-term rehab anyway.  We discussed her current illness and what it means in the larger context of her on-going co-morbidities.  Natural disease trajectory and expectations at EOL were discussed.  Mrs. Buescher and [name] are able to accurately name her current health concerns.  They were able to share what cardiology and attending have told them this morning.  Initially, Mrs. Hitch was agreeable to home with home health services, but now she feels that she needs more care.  Advanced directives, concepts specific to code status, and rehospitalization were considered and discussed.  We talked about returning to the hospital.  At this point Mrs. Gorman would not desire to return to the hospital.  We talked about preferred place of death.  Mrs. Graefe states that her preference would be to pass out a residential hospice.  Hospice and Palliative Care services outpatient were explained and offered.  We talked about different hospice providers including in no particular order, [facility], [facility], hospice in [place], hospice in [place].  Initially, Mrs. Wiegand and [name] were hopeful that she could transition directly to residential hospice.  We talked about the limitations, usually 2 weeks or less for most hospice facilities.  Mrs. Magana and [name] state that they would like time to  consider whether she returns to her own home with the benefits of hospice and 24/7 private pay care - OR -she seeks private pay placement in long-term care, ([facility] would be facility of choice), again with the benefits of hospice.  At this point Mrs. Tipping would like to continue her current medications, but adding medications for pain, anxiety, breathlessness.  Prognosis discussed with permission.  Mrs. Sterbenz shares that attending stated 2 to 10 weeks.  I share that I would agree.  Questions and concerns were addressed.  The family was encouraged to call with questions or concerns.   Conference with  attending, bedside nursing staff, transition of care team related to patient condition, needs, goals of care, disposition.   HCPOA   HCPOA -Mrs. [place] names her niece, Mordecai Maes as her healthcare power of attorney.  She states that [name] is both healthcare and [place].   SUMMARY OF RECOMMENDATIONS   At this point continue to treat the treatable but no CPR or intubation. Agreeable to hospice care, provider choice offered Considering hospice provider choice  Considering home with hospice and 24/7 caregiving versus private pay at LTC with hospice   Code Status/Advance Care Planning:  DNR  Symptom Management:   Per hospitalist, no additional needs at this time.  Palliative Prophylaxis:   Frequent Pain Assessment and Oral Care  Additional Recommendations (Limitations, Scope, Preferences):  Disposition with hospice care.  Initially, continue to treat the treatable but no CPR or intubation.  Psycho-social/Spiritual:   Desire for further Chaplaincy support:no  Additional Recommendations: Caregiving  Support/Resources and Education on Hospice  Prognosis:   Unable to determine, 2 to 10 weeks would not be surprising.  Discharge Planning: To be determined.  Excepting of hospice care, but unsure if they will accept hospice at home with 24/7 caregivers or at a private pay long-term care facility      Primary Diagnoses: Present on Admission: . B12 deficiency . IMPAIRED GLUCOSE TOLERANCE . Essential hypertension . ATRIAL FIBRILLATION . Acute pericarditis . Chest pain   I have reviewed the medical record, interviewed the patient and family, and examined the patient. The following aspects are pertinent.  Past Medical History:  Diagnosis Date  . Anxiety   . Atrial fibrillation ([place])   . Cancer ([place])   . Cataract    REMOVED BILATERAL  . Chest pain, unspecified   . Chronic low back pain   . Diverticulosis of colon   . History of colon cancer   . History  of colonic polyps   . History of colonoscopy   . History of uterine cancer   . Hyperlipidemia   . Hypertension   . Hypothyroid   . Impaired glucose tolerance   . Mitral valve disorder   . Osteoarthritis   . Osteoporosis   . Patent foramen ovale   . Peripheral neuropathy   . Peripheral vascular disease ([place])   . Splenic infarction   . UTI (lower urinary tract infection)   . Venous insufficiency   . Vitamin B12 deficiency    Social History   Socioeconomic History  . Marital status: Widowed    Spouse name: Not on file  . Number of children: 0  . Years of education: Not on file  . Highest education level: Not on file  Occupational History  . Occupation: retired    Comment: part time at [employer]  . Smoking status: Never Smoker  . Smokeless tobacco: Never Used  Substance and Sexual Activity  .  Alcohol use: Yes    Alcohol/week: 0.0 standard drinks    Comment: occ  . Drug use: No  . Sexual activity: Never  Other Topics Concern  . Not on file  Social History Narrative  . Not on file   Social Determinants of Health   Financial Resource Strain: Not on file  Food Insecurity: Not on file  Transportation Needs: Not on file  Physical Activity: Not on file  Stress: Not on file  Social Connections: Not on file   Family History  Problem Relation Age of Onset  . Cancer Mother        deceased age 63  . Heart failure Father        deceased age 80  . Cancer Brother        deceased age 35  . Stroke Sister        and heart problems; deceased age 80  . Heart disease Sister    Scheduled Meds: . (feeding supplement) PROSource Plus  30 mL Oral BID BM  . colchicine  0.6 mg Oral Daily  . ketorolac  1 drop Right Eye BID  . levothyroxine  100 mcg Oral Q0600  . metoprolol succinate  100 mg Oral Daily  . multivitamin with minerals  1 tablet Oral Daily  . ondansetron  4 mg Oral Q12H  . pantoprazole  40 mg Oral Daily  . pneumococcal 23 valent vaccine  0.5 mL  Intramuscular Tomorrow-1000  . polyethylene glycol powder  1 Container Oral Once  . polyvinyl alcohol  1 drop Both Eyes BID  . vitamin B-12  1,000 mcg Oral Daily  . warfarin  2.5 mg Oral ONCE-1600  . Warfarin - Pharmacist Dosing Inpatient   Does not apply q1600   Continuous Infusions: PRN Meds:.acetaminophen, ALPRAZolam, bisacodyl, HYDROcodone-acetaminophen, HYDROmorphone (DILAUDID) injection, ondansetron (ZOFRAN) IV Medications Prior to Admission:  Prior to Admission medications   Medication Sig Start Date End Date Taking? Authorizing Provider  acetaminophen (TYLENOL) 500 MG tablet Take 2 tablets (1,000 mg total) by mouth 2 (two) times daily as needed. Patient taking differently: Take 1,000 mg by mouth 2 (two) times daily.  04/30/18  Yes Laurey Morale, MD  ALPRAZolam Duanne Moron) 0.25 MG tablet TAKE 1 TABLET BY MOUTH EVERY DAY AT BEDTIME AS NEEDED Patient taking differently: Take 0.125 mg by mouth at bedtime as needed for anxiety or sleep.  10/31/20  Yes Laurey Morale, MD  atorvastatin (LIPITOR) 20 MG tablet TAKE 1 TABLET BY MOUTH  DAILY Patient taking differently: Take 20 mg by mouth daily.  06/16/20  Yes Josue Hector, MD  Calcium Carbonate w/Vitamin D (CALTRATE 600+D) 600-400 MG-UNIT TABS Take 1 tablet by mouth daily.    Yes [provider]  carvedilol (COREG) 12.5 MG tablet Take 1 tablet (12.5 mg total) by mouth 2 (two) times daily with a meal. 09/15/20  Yes Josue Hector, MD  cetirizine (ZYRTEC) 10 MG tablet Take 1 tablet (10 mg total) by mouth daily. Patient taking differently: Take 10 mg by mouth daily as needed for allergies.  10/30/18  Yes Hoyt Koch, MD  CLIMARA 0.1 MG/24HR patch Place 0.05 mg onto the skin once a week.  03/23/19  Yes [provider]  furosemide (LASIX) 40 MG tablet Take 1 tablet (40 mg total) by mouth daily. 04/11/20  Yes Josue Hector, MD  ketorolac (ACULAR) 0.5 % ophthalmic solution PLACE 1 DROP INTO THE RIGHT EYE TWICE A DAY FOR 30  DAYS Patient taking differently: Place  1 drop into the right eye 2 (two) times daily.  07/18/20  Yes Rankin, Clent Demark, MD  meclizine (ANTIVERT) 25 MG tablet Take 1 tablet (25 mg total) by mouth every 4 (four) hours as needed for dizziness. 12/08/19  Yes Laurey Morale, MD  Multiple Vitamins-Minerals (CENTRUM SILVER PO) Take 1 tablet by mouth daily.     Yes [provider]  nitroGLYCERIN (NITROSTAT) 0.4 MG SL tablet PLACE 1 TABLET UNDER THE TONGUE EVERY 5 MINUTES AS NEEDED FOR CHEST PAIN Patient taking differently: Place 0.4 mg under the tongue every 5 (five) minutes as needed for chest pain (max 3 doses).  06/17/20  Yes Laurey Morale, MD  ondansetron (ZOFRAN) 8 MG tablet TAKE 1 TABLET (8 MG TOTAL) BY MOUTH EVERY 6 (SIX) HOURS AS NEEDED FOR NAUSEA OR VOMITING. 10/31/20  Yes Laurey Morale, MD  phenazopyridine (PYRIDIUM) 200 MG tablet Take 1 tablet (200 mg total) by mouth 3 (three) times daily as needed (urinary burning). Patient taking differently: Take 200 mg by mouth 3 (three) times daily as needed (for urinary burning).  09/03/19  Yes Laurey Morale, MD  Polyethyl Glycol-Propyl Glycol (SYSTANE FREE OP) Place 1 drop into the left eye 2 (two) times daily.   Yes [provider]  potassium chloride SA (KLOR-CON) 20 MEQ tablet Take 1 tablet (20 mEq total) by mouth every other day. Patient taking differently: Take 20 mEq by mouth daily.  04/11/20  Yes Josue Hector, MD  quinapril (ACCUPRIL) 10 MG tablet Take 1 tablet (10 mg total) by mouth daily. 09/15/20  Yes Josue Hector, MD  spironolactone (ALDACTONE) 25 MG tablet TAKE 1 TABLET BY MOUTH  DAILY Patient taking differently: Take 25 mg by mouth daily.  06/16/20  Yes Josue Hector, MD  SYNTHROID 100 MCG tablet TAKE 1 TABLET BY MOUTH  DAILY BEFORE BREAKFAST Patient taking differently: Take 100 mcg by mouth daily before breakfast.  06/06/20  Yes Laurey Morale, MD  vitamin B-12 (CYANOCOBALAMIN) 1000 MCG tablet Take 1,000 mcg by mouth daily.      Yes [provider]  warfarin (COUMADIN) 5 MG tablet TAKE AS DIRECTED BY  [place] Patient taking differently: Take 5 mg by mouth daily.  10/26/20  Yes Josue Hector, MD   Allergies  Allergen Reactions  . Codeine Nausea Only   Review of Systems  Unable to perform ROS: Age    Physical Exam Vitals and nursing note reviewed.  Constitutional:      General: She is not in acute distress.    Appearance: She is normal weight. She is ill-appearing.  HENT:     Head: Normocephalic and atraumatic.  Cardiovascular:     Rate and Rhythm: Tachycardia present.  Pulmonary:     Effort: Pulmonary effort is normal. No tachypnea.  Abdominal:     Palpations: Abdomen is soft.     Tenderness: There is no guarding.  Skin:    General: Skin is dry.  Neurological:     Mental Status: She is alert and oriented to person, place, and time.  Psychiatric:        Mood and Affect: Mood normal.        Behavior: Behavior normal.     Vital Signs: BP 110/81 (BP Location: Left Arm)   Pulse (!) 105   Temp (!) 97.4 F (36.3 C) (Oral)   Resp 20   Ht _0  (1.803 m)   Wt 83 kg   SpO2 94%  BMI 25.52 kg/m  Pain Scale: 0-10   Pain Score: 4    SpO2: SpO2: 94 % O2 Device:SpO2: 94 % O2 Flow Rate: .O2 Flow Rate (L/min): 2 L/min  IO: Intake/output summary:   Intake/Output Summary (Last 24 hours) at 11/09/2020 1339 Last data filed at 11/09/2020 0300 Gross per 24 hour  Intake 240 ml  Output 1101 ml  Net -861 ml    LBM: Last BM Date: 11/06/20 Baseline Weight: Weight: 77.1 kg Most recent weight: Weight: 83 kg     Palliative Assessment/Data:   Flowsheet Rows   Flowsheet Row Most Recent Value  Intake Tab   Referral Department Hospitalist  Unit at Time of Referral Other (Comment)  Palliative Care Primary Diagnosis Cardiac  Date Notified 11/08/20  Palliative Care Type New Palliative care  Reason for referral Clarify Goals of Care  Date of Admission 11/01/20  Date first seen  by Palliative Care 11/09/20  # of days Palliative referral response time 1 Day(s)  # of days IP prior to Palliative referral 7  Clinical Assessment   Palliative Performance Scale Score 20%  Pain Max last 24 hours Not able to report  Pain Min Last 24 hours Not able to report  Dyspnea Max Last 24 Hours Not able to report  Dyspnea Min Last 24 hours Not able to report  Psychosocial & Spiritual Assessment   Palliative Care Outcomes       Time In: 1020 Time Out: 1135 Time Total: 75 minutes  Greater than 50%  of this time was spent counseling and coordinating care related to the above assessment and plan.  Signed by: Drue Novel, NP   Please contact Palliative Medicine Team phone at [phone] for questions and concerns.  For individual provider: See Shea Evans

## 2020-11-09 NOTE — Progress Notes (Signed)
[place] for warfarin Indication: atrial fibrillation  Allergies  Allergen Reactions  . Codeine Nausea Only    Patient Measurements: Height: 5\' 11"  (180.3 cm) Weight: 83 kg (182 lb 15.7 oz) IBW/kg (Calculated) : 70.8  Vital Signs: Temp: 97.4 F (36.3 C) (12/15 0446) Temp Source: Oral (12/15 0446) BP: 110/81 (12/15 0446) Pulse Rate: 105 (12/15 0446)  Labs: Recent Labs    11/07/20 0653 11/08/20 0424 11/09/20 0310  HGB 10.9* 11.9* 12.1  HCT 32.5* 35.1* 38.1  PLT 215 241 251  LABPROT 24.3* 25.5* 29.4*  INR 2.3* 2.4* 2.9*  CREATININE 0.88 0.94 1.09*    Estimated Creatinine Clearance: 39.1 mL/min (A) (by C-G formula based on SCr of 1.09 mg/dL (H)).   Assessment: 77 yoF admitted with pleuritic chest pain found to have pericardial effusion. Pt on warfarin PTA for hx AFib. INR 2.8 on admission, increased to 6.6 on 12/11 on home regimen likely due to poor PO intake, vitamin K given 12/11 am.  INR today is therapeutic at 2.9, CBC stable. Diagnostic EGD with no acute issues identified.  *Home warfarin dose = 5mg  daily  Goal of Therapy:  INR 2-3 Monitor platelets by anticoagulation protocol: Yes   Plan:  Warfarin 2.5mg  PO x1 tonight Daily INR   Arrie Senate, PharmD, BCPS, [facility] Clinical Pharmacist [phone] Please check AMION for all [facility] Pharmacy numbers 11/09/2020

## 2020-11-10 ENCOUNTER — Telehealth: Payer: Self-pay | Admitting: Family Medicine

## 2020-11-10 LAB — PROTIME-INR
INR: 3.8 — ABNORMAL HIGH (ref 0.8–1.2)
Prothrombin Time: 36.1 seconds — ABNORMAL HIGH (ref 11.4–15.2)

## 2020-11-10 LAB — CBC
HCT: 36.1 % (ref 36.0–46.0)
Hemoglobin: 12 g/dL (ref 12.0–15.0)
MCH: 32.6 pg (ref 26.0–34.0)
MCHC: 33.2 g/dL (ref 30.0–36.0)
MCV: 98.1 fL (ref 80.0–100.0)
Platelets: 299 10*3/uL (ref 150–400)
RBC: 3.68 MIL/uL — ABNORMAL LOW (ref 3.87–5.11)
RDW: 13.4 % (ref 11.5–15.5)
WBC: 11.2 10*3/uL — ABNORMAL HIGH (ref 4.0–10.5)
nRBC: 0 % (ref 0.0–0.2)

## 2020-11-10 MED ORDER — KETOROLAC TROMETHAMINE 0.5 % OP SOLN
1.0000 [drp] | Freq: Two times a day (BID) | OPHTHALMIC | Status: DC
  Administered 2020-11-10 – 2020-11-11 (×3): 1 [drp] via OPHTHALMIC
  Filled 2020-11-10 (×2): qty 5

## 2020-11-10 MED ORDER — MORPHINE SULFATE (PF) 4 MG/ML IV SOLN
4.0000 mg | Freq: Once | INTRAVENOUS | Status: AC
  Administered 2020-11-10: 09:00:00 4 mg via INTRAVENOUS
  Filled 2020-11-10: qty 1

## 2020-11-10 MED ORDER — LORAZEPAM 2 MG/ML IJ SOLN: 1.0000 mg | INTRAMUSCULAR | Status: DC | PRN

## 2020-11-10 MED ORDER — DIPHENHYDRAMINE HCL 12.5 MG/5ML PO ELIX
12.5000 mg | ORAL_SOLUTION | Freq: Four times a day (QID) | ORAL | Status: DC | PRN
  Filled 2020-11-10: qty 5

## 2020-11-10 MED ORDER — POLYVINYL ALCOHOL 1.4 % OP SOLN
1.0000 [drp] | Freq: Two times a day (BID) | OPHTHALMIC | Status: DC
  Administered 2020-11-10 – 2020-11-11 (×2): 1 [drp] via OPHTHALMIC
  Filled 2020-11-10: qty 15

## 2020-11-10 MED ORDER — LIDOCAINE HCL URETHRAL/MUCOSAL 2 % EX GEL
1.0000 "application " | Freq: Once | CUTANEOUS | Status: DC | PRN
  Filled 2020-11-10: qty 11

## 2020-11-10 MED ORDER — DIPHENHYDRAMINE HCL 50 MG/ML IJ SOLN: 12.5000 mg | Freq: Four times a day (QID) | INTRAMUSCULAR | Status: DC | PRN

## 2020-11-10 MED ORDER — MORPHINE 100MG IN NS 100ML (1MG/ML) PREMIX INFUSION
2.0000 mg/h | INTRAVENOUS | Status: DC
  Administered 2020-11-10: 15:00:00 1 mg/h via INTRAVENOUS
  Administered 2020-11-11: 20:00:00 0.5 mg/h via INTRAVENOUS
  Filled 2020-11-10: qty 100

## 2020-11-10 MED ORDER — ONDANSETRON HCL 4 MG/2ML IJ SOLN
4.0000 mg | Freq: Four times a day (QID) | INTRAMUSCULAR | Status: DC | PRN
  Administered 2020-11-10 – 2020-11-11 (×3): 4 mg via INTRAVENOUS
  Filled 2020-11-10 (×3): qty 2

## 2020-11-10 NOTE — Progress Notes (Signed)
[place] for warfarin Indication: atrial fibrillation  Allergies  Allergen Reactions  . Codeine Nausea Only    Patient Measurements: Height: 5\' 11"  (180.3 cm) Weight: 82.9 kg (182 lb 12.2 oz) IBW/kg (Calculated) : 70.8  Vital Signs: Temp: 98 F (36.7 C) (12/15 1948) Temp Source: Oral (12/15 1948) BP: 117/87 (12/16 0407) Pulse Rate: 93 (12/16 0407)  Labs: Recent Labs    11/08/20 0424 11/09/20 0310 11/10/20 0233  HGB 11.9* 12.1 12.0  HCT 35.1* 38.1 36.1  PLT 241 251 299  LABPROT 25.5* 29.4* 36.1*  INR 2.4* 2.9* 3.8*  CREATININE 0.94 1.09*  --     Estimated Creatinine Clearance: 39.1 mL/min (A) (by C-G formula based on SCr of 1.09 mg/dL (H)).   Assessment: 55 yoF admitted with pleuritic chest pain found to have pericardial effusion. Pt on warfarin PTA for hx AFib. INR 2.8 on admission, increased to 6.6 on 12/11 on home regimen likely due to poor PO intake, vitamin K given 12/11 am.  INR today is supratherapeutic at 3.8 despite reduced doses, CBC stable. Diagnostic EGD 12/14 with no acute issues identified. Noted likely plans for hospice.  *Home warfarin dose = 5mg  daily  Goal of Therapy:  INR 2-3 Monitor platelets by anticoagulation protocol: Yes   Plan:  Hold warfarin tonight Daily INR   Arrie Senate, PharmD, BCPS, [facility] Clinical Pharmacist [phone] Please check AMION for all [facility] Pharmacy numbers 11/10/2020

## 2020-11-10 NOTE — Progress Notes (Signed)
Physical Therapy Discharge Patient Details Name: [name] MRN: [id number] DOB: 12/14/1928 Today's Date: 11/10/2020 Time:  -     Patient discharged from PT services secondary to pt transitioned to comfort care only.   Please see latest therapy progress note for current level of functioning and progress toward goals.    Progress and discharge plan discussed with patient and/or caregiver: Patient/Caregiver agrees with plan  GP  [name], DPT Acute Rehabilitation Services [phone]     [place] 11/10/2020, 9:45 AM

## 2020-11-10 NOTE — Progress Notes (Signed)
Transitioning to hospice and comfort care measures.  Agree with this plan.  She has severe rheumatic mitral stenosis and she is not a candidate for any therapies.  The treatment of choice would be surgical intervention but this is not feasible.  She has severe RV failure.  Would continue her metoprolol to control her heart rate for comfort measures.  Her Coumadin can be discontinued at the discretion of hospice and palliative care.  Cardiology will sign off.  We are available if needed.  [name], [place]  [address], [place], [place] [address] [phone]  9:48 AM

## 2020-11-10 NOTE — Care Management Important Message (Signed)
Important Message  Patient Details  Name: [name] MRN: [id number] Date of Birth: August 26, 1929   Medicare Important Message Given:  Yes     Shelda Altes 11/10/2020, 9:17 AM

## 2020-11-10 NOTE — Progress Notes (Signed)
   11/10/20 1033  Mobility  Activity Refused mobility (Not appropriate for mobility. Comfort measures in place)

## 2020-11-10 NOTE — TOC Progression Note (Addendum)
Transition of Care [facility]) - Progression Note    Patient Details  Name: [name] MRN: [id number] Date of Birth: 12-Dec-1928  Transition of Care [facility]) CM/SW [place], [place] Phone Number: 11/10/2020, 11:23 AM  Clinical Narrative:     CSW met with Niece and pt bedside. Discussed that [facility] has a long wait. Discussed potential option to transition to [facility] during wait for [facility] bed. Pt does not want to be moved twice. CSW discussed options for pt to go to other hospice facilities that have sooner availability. Pt does not want [place]. Pt is wanting to wait until [facility] has availability. Niece is helpful in explaining the [facility]. Niece, pt, and family will discuss further potential for other hospice facilities in [place] and [place].   CSW notified [facility] to inquire if they would be able to take pt on comfort care while waiting for [facility] bed.   1341: CSW spoke with pt and pt niece bedside. Pt and niece are okay with referral to [place] home but would like to remain on list for [facility] place for first available bed. CSW confirmed plan with [facility].   Expected Discharge Plan: [place] Barriers to Discharge: Continued Medical Work up  Expected Discharge Plan and Services Expected Discharge Plan: [place]   Discharge Planning Services: CM Consult Post Acute Care Choice: [place] arrangements for the past 2 months: [place]                   DME Agency: NA       HH Arranged: PT,OT [place]: [place] Date [place]: 11/04/20 Time [place]: [phone] Representative spoke with at [place]: [place] ([place]) Interventions    Readmission Risk Interventions Readmission Risk Prevention Plan 11/04/2020  Transportation Screening Complete  PCP or Specialist Appt within 3-5 Days Complete  HRI  or [place] Complete  Social Work Consult for [place] Planning/Counseling Complete  Palliative Care Screening Complete  Medication Review Press photographer) Complete  Some recent data might be hidden

## 2020-11-10 NOTE — Progress Notes (Addendum)
Manufacturing engineer [facility])  Recieved referral for hospice services at discharge origionally at [facility] however plan has since changed and family would like full comfort measures as well as Optometrist. I notified family and TOC of long wait for [facility] and also offered [place] however no beds available today there either. Family would like to wait on either [place], or other options closer to their proximity.   FYI: Patient is currently on a Morphine drip so unsure if this will continue at discharge. If so ACC will need to make some accommodations prior to discharging to [facility] or [place] place.   ACC will follow up once a bed comes available. Otherwise we understand if seek other options at this time.   Please call with any questions or concerns.  Clementeen Hoof, BSN, [facility] (in [place]) [phone]

## 2020-11-10 NOTE — Progress Notes (Addendum)
[facility] PROGRESS NOTE    Progress Note  LADELLE TEODORO  YEB:[id number] DOB: 1929-09-10 DOA: 11/01/2020 PCP: Laurey Morale, MD     Brief Narrative:   HENRINE HAYTER is an 86 y.o. female past medical history of atrial fibrillation, mitral stenosis, chronic kidney disease stage III, chronic hyponatremia history of colon cancer comes into the ED complaining of chest pain that has been going on for a week.  CT of the chest showed no evidence of dissection or aneurysm, 2D echo was done that showed moderate pericardial effusion with severe bilateral atrial enlargement, and no signs of physiologic tamponade, cardiology was consulted recommended to continue colchicine and eventually held but eventually resume.  on  echo she is has severe mitral stenosis and palliative care was consulted and awaiting recommendation  Assessment/Plan:   Acute pericarditis/pericardial effusion: Mitral stenosis and severe right-sided heart failure: Acute kidney injury on chronic kidney disease stage III/hyperkalemia: Chronic hyponatremia: Chronic normocytic anemia: Atrial fibrillation with a supratherapeutic INR on admission: Essential hypertension: Asymptomatic bacteriuria: Hypothyroidism: Chronic thrombocytopenia: Abnormal CT scan: Nausea: Moderate protein caloric malnutrition: Constipation: After having a long discussion with the family over 35 minutes, the family decided that it was the patient's wishes not to have any prolonging interventions. They decided to proceed with full comfort care they want all medications and left discontinued and just use medications that are required for comfort. She has had a significant decreased oral intake for the last 8 weeks, She has not had anything to eat over 48 hours.  So I think she will be a good candidate for residential hospice facility.  As her life expectancy is less than 2 weeks. We will inform TOC to try to get her to a residential hospice  facility   DVT prophylaxis: coumadin Family Communication:Neice Status is: Inpatient  Remains inpatient appropriate because:Hemodynamically unstable   Dispo: The patient is from: Home              Anticipated d/c is to: SNF              Anticipated d/c date is:1days              Patient currently is not medically stable to d/c.   Code Status:     Code Status Orders  (From admission, onward)         Start     Ordered   11/01/20 2236  Do not attempt resuscitation (DNR)  Continuous       Question Answer Comment  In the event of cardiac or respiratory ARREST Do not call a "code blue"   In the event of cardiac or respiratory ARREST Do not perform Intubation, CPR, defibrillation or ACLS   In the event of cardiac or respiratory ARREST Use medication by any route, position, wound care, and other measures to relive pain and suffering. May use oxygen, suction and manual treatment of airway obstruction as needed for comfort.      11/01/20 2235        Code Status History    Date Active Date Inactive Code Status Order ID Comments User Context   11/01/2020 1915 11/01/2020 2235 DNR [id number]  Drenda Freeze, MD ED   02/12/2019 1126 02/13/2019 1807 DNR [id number]  Cheryln Manly, NP Inpatient   02/12/2019 0706 02/12/2019 1126 DNR [id number]  Ward, Delice Bison, DO ED   01/16/2018 1226 01/17/2018 1921 DNR [id number]  Lady Deutscher, MD ED   04/02/2017 1352 04/04/2017 1344 DNR [id number]  Caren Griffins, MD Inpatient   02/17/2016 1819 02/21/2016 1347 DNR [id number]  Thurnell Lose, MD Inpatient   12/15/2015 1056 12/18/2015 1705 DNR [id number]  Samella Parr, NP Inpatient   Advance Care Planning Activity    Advance Directive Documentation   Flowsheet Row Most Recent Value  Type of Advance Directive Healthcare Power of Attorney  Pre-existing out of facility DNR order (yellow form or pink MOST form) --  "MOST" Form in Place? --        IV Access:    Peripheral IV   Procedures and  diagnostic studies:   No results found.   Medical Consultants:    None.  Anti-Infectives:   none  Subjective:    AUNISTY REALI had a poor night overall and comfortable in bed complaining of nausea.  Objective:    Vitals:   11/09/20 0446 11/09/20 1640 11/09/20 1948 11/10/20 0407  BP: 110/81 117/79 108/79 117/87  Pulse: (!) 105 88 97 93  Resp: 20 18 17 17   Temp: (!) 97.4 F (36.3 C)  98 F (36.7 C)   TempSrc: Oral  Oral   SpO2: 94% 97% 94% 99%  Weight:    82.9 kg  Height:       SpO2: 99 % O2 Flow Rate (L/min): 2 L/min   Intake/Output Summary (Last 24 hours) at 11/10/2020 0831 Last data filed at 11/09/2020 2054 Gross per 24 hour  Intake --  Output 500 ml  Net -500 ml   Filed Weights   11/08/20 0311 11/09/20 0441 11/10/20 0407  Weight: 83.7 kg 83 kg 82.9 kg    Exam: General exam: In no acute distress. Respiratory system: Good air movement and clear to auscultation. Cardiovascular system: S1 & S2 heard, RRR. No JVD. Gastrointestinal system: Abdomen is nondistended, soft and nontender.  Extremities: No pedal edema. Skin: No rashes, lesions or ulcers  Data Reviewed:    Labs: Basic Metabolic Panel: Recent Labs  Lab 11/05/20 0308 11/06/20 0410 11/07/20 0653 11/08/20 0424 11/09/20 0310  NA 126* 129* 133* 134* 134*  K 4.6 4.1 4.2 4.5 4.8  CL 96* 99 103 104 103  CO2 21* 20* 23 21* 18*  GLUCOSE 140* 99 125* 112* 117*  BUN 63* 42* 27* 22 25*  CREATININE 1.78* 1.17* 0.88 0.94 1.09*  CALCIUM 8.4* 8.5* 8.3* 8.7* 9.0   GFR Estimated Creatinine Clearance: 39.1 mL/min (A) (by C-G formula based on SCr of 1.09 mg/dL (H)). Liver Function Tests: Recent Labs  Lab 11/07/20 0653  AST 59*  ALT 73*  ALKPHOS 122  BILITOT 1.8*  PROT 5.7*  ALBUMIN 2.9*   No results for input(s): LIPASE, AMYLASE in the last 168 hours. No results for input(s): AMMONIA in the last 168 hours. Coagulation profile Recent Labs  Lab 11/06/20 0410 11/07/20 0653  11/08/20 0424 11/09/20 0310 11/10/20 0233  INR 2.8* 2.3* 2.4* 2.9* 3.8*   COVID-19 Labs  No results for input(s): DDIMER, FERRITIN, LDH, CRP in the last 72 hours.  Lab Results  Component Value Date   SARSCOV2NAA NEGATIVE 11/01/2020   SARSCOV2NAA Detected (A) 10/09/2019    CBC: Recent Labs  Lab 11/06/20 0410 11/07/20 0653 11/08/20 0424 11/09/20 0310 11/10/20 0233  WBC 8.3 8.6 10.0 11.0* 11.2*  HGB 11.8* 10.9* 11.9* 12.1 12.0  HCT 34.7* 32.5* 35.1* 38.1 36.1  MCV 96.7 97.3 98.0 98.4 98.1  PLT 206 215 241 251 299   Cardiac Enzymes: No results for input(s): CKTOTAL, CKMB, CKMBINDEX, TROPONINI in the last 168  hours. BNP (last 3 results) No results for input(s): PROBNP in the last 8760 hours. CBG: No results for input(s): GLUCAP in the last 168 hours. D-Dimer: No results for input(s): DDIMER in the last 72 hours. Hgb A1c: No results for input(s): HGBA1C in the last 72 hours. Lipid Profile: No results for input(s): CHOL, HDL, LDLCALC, TRIG, CHOLHDL, LDLDIRECT in the last 72 hours. Thyroid function studies: No results for input(s): TSH, T4TOTAL, T3FREE, THYROIDAB in the last 72 hours.  Invalid input(s): FREET3 Anemia work up: No results for input(s): VITAMINB12, FOLATE, FERRITIN, TIBC, IRON, RETICCTPCT in the last 72 hours. Sepsis Labs: Recent Labs  Lab 11/07/20 0653 11/08/20 0424 11/09/20 0310 11/10/20 0233  WBC 8.6 10.0 11.0* 11.2*   Microbiology Recent Results (from the past 240 hour(s))  Resp Panel by RT-PCR (Flu A&B, Covid) Nasopharyngeal Swab     Status: None   Collection Time: 11/01/20  7:58 PM   Specimen: Nasopharyngeal Swab; Nasopharyngeal(NP) swabs in vial transport medium  Result Value Ref Range Status   SARS Coronavirus 2 by RT PCR NEGATIVE NEGATIVE Final    Comment: (NOTE) SARS-CoV-2 target nucleic acids are NOT DETECTED.  The SARS-CoV-2 RNA is generally detectable in upper respiratory specimens during the acute phase of infection. The  lowest concentration of SARS-CoV-2 viral copies this assay can detect is 138 copies/mL. A negative result does not preclude SARS-Cov-2 infection and should not be used as the sole basis for treatment or other patient management decisions. A negative result may occur with  improper specimen collection/handling, submission of specimen other than nasopharyngeal swab, presence of viral mutation(s) within the areas targeted by this assay, and inadequate number of viral copies(<138 copies/mL). A negative result must be combined with clinical observations, patient history, and epidemiological information. The expected result is Negative.  Fact Sheet for Patients:  EntrepreneurPulse.com.au  Fact Sheet for Healthcare Providers:  IncredibleEmployment.be  This test is no t yet approved or cleared by the [place] FDA and  has been authorized for detection and/or diagnosis of SARS-CoV-2 by FDA under an Emergency Use Authorization (EUA). This EUA will remain  in effect (meaning this test can be used) for the duration of the COVID-19 declaration under Section 564(b)(1) of the Act, 21 U.S.C.section 360bbb-3(b)(1), unless the authorization is terminated  or revoked sooner.       Influenza A by PCR NEGATIVE NEGATIVE Final   Influenza B by PCR NEGATIVE NEGATIVE Final    Comment: (NOTE) The Xpert Xpress SARS-CoV-2/FLU/RSV plus assay is intended as an aid in the diagnosis of influenza from Nasopharyngeal swab specimens and should not be used as a sole basis for treatment. Nasal washings and aspirates are unacceptable for Xpert Xpress SARS-CoV-2/FLU/RSV testing.  Fact Sheet for Patients: EntrepreneurPulse.com.au  Fact Sheet for Healthcare Providers: IncredibleEmployment.be  This test is not yet approved or cleared by the [place] FDA and has been authorized for detection and/or diagnosis of SARS-CoV-2 by FDA under  an Emergency Use Authorization (EUA). This EUA will remain in effect (meaning this test can be used) for the duration of the COVID-19 declaration under Section 564(b)(1) of the Act, 21 U.S.C. section 360bbb-3(b)(1), unless the authorization is terminated or revoked.  Performed at [facility], [place] [address], [place], [place] [address]   Urine Culture     Status: Abnormal   Collection Time: 11/02/20  6:10 AM   Specimen: Urine, Random  Result Value Ref Range Status   Specimen Description URINE, RANDOM  Final   Special  Requests   Final    NONE Performed at [facility], [place] [address] [place], [place] [address]    Culture 10,000 COLONIES/mL PSEUDOMONAS AERUGINOSA (A)  Final   Report Status 11/04/2020 FINAL  Final   Organism ID, Bacteria PSEUDOMONAS AERUGINOSA (A)  Final      Susceptibility   Pseudomonas aeruginosa - MIC*    CEFTAZIDIME <=1 SENSITIVE Sensitive     CIPROFLOXACIN 0.5 SENSITIVE Sensitive     GENTAMICIN <=1 SENSITIVE Sensitive     IMIPENEM 2 SENSITIVE Sensitive     PIP/TAZO <=4 SENSITIVE Sensitive     CEFEPIME 0.5 SENSITIVE Sensitive     * 10,000 COLONIES/mL PSEUDOMONAS AERUGINOSA     Medications:   . levothyroxine  100 mcg Oral Q0600  . polyethylene glycol powder  1 Container Oral Once   Continuous Infusions: . morphine        LOS: 9 days   Charlynne Cousins  [facility]  11/10/2020, 8:31 AM

## 2020-11-10 NOTE — Telephone Encounter (Signed)
[place] [phone]   The patient is getting discharged tomorrow 12/17 from [place] to [facility] needs to know if Dr. Sarajane Jews will be the Attending for this patient and needs to know if Dr. Sarajane Jews will agree with Hospice Diagnosis.  Please advsie

## 2020-11-11 MED ORDER — LORAZEPAM 2 MG/ML IJ SOLN: 1.0000 mg | INTRAMUSCULAR | 0 refills | Status: AC | PRN

## 2020-11-11 MED ORDER — KETOROLAC TROMETHAMINE 0.5 % OP SOLN
1.0000 [drp] | Freq: Two times a day (BID) | OPHTHALMIC | 0 refills | Status: AC
Start: 2020-11-11 — End: ?

## 2020-11-11 MED ORDER — PROMETHAZINE HCL 25 MG/ML IJ SOLN: 12.5000 mg | Freq: Four times a day (QID) | INTRAMUSCULAR | 0 refills | Status: AC | PRN

## 2020-11-11 MED ORDER — MORPHINE 100MG IN NS 100ML (1MG/ML) PREMIX INFUSION: 2.0000 mg/h | INTRAVENOUS | 0 refills | Status: AC

## 2020-11-11 MED ORDER — MORPHINE SULFATE (PF) 4 MG/ML IV SOLN
4.0000 mg | Freq: Once | INTRAVENOUS | Status: AC
  Administered 2020-11-11: 09:00:00 4 mg via INTRAVENOUS
  Filled 2020-11-11: qty 1

## 2020-11-11 MED ORDER — PROMETHAZINE HCL 25 MG/ML IJ SOLN
12.5000 mg | Freq: Four times a day (QID) | INTRAMUSCULAR | Status: DC | PRN
  Administered 2020-11-11: 02:00:00 12.5 mg via INTRAVENOUS
  Filled 2020-11-11: qty 1

## 2020-11-11 NOTE — Progress Notes (Signed)
PT awaken by episode of nausea. Too early for PRN Zofran. MD paged. Order received for Phenergan.

## 2020-11-11 NOTE — Progress Notes (Signed)
Manufacturing engineer [facility]) Hospital Liaison note.     Consents for [facility] are complete. They can admit this patient tonight at 9 PM. Please arrange transport for patient to arrive at that time.   The number to [place] is [phone], please call when patient leaves the hospital so they will be aware she is being transported.   Thank you,     Farrel Gordon, RN, [facility]       [place] (listed on AMION under Hospice/[facility])     [phone]

## 2020-11-11 NOTE — Progress Notes (Signed)
Nutrition Brief Note  Chart reviewed. Pt now transitioning to comfort care.  No further nutrition interventions warranted at this time.  Please re-consult as needed.   Jarmon Javid W, RD, LDN, CDCES Registered Dietitian II Certified Diabetes Care and Education Specialist Please refer to AMION for RD and/or RD on-call/weekend/after hours pager  

## 2020-11-11 NOTE — TOC Transition Note (Signed)
Transition of Care [facility]) - CM/SW Discharge Note   Patient Details  Name: [name] MRN: [id number] Date of Birth: 01/09/1929  Transition of Care [facility]) CM/SW Contact:  Bethann Berkshire, [place] Phone Number: 11/11/2020, 11:10 AM   Clinical Narrative:     CSW spoke with niece [name]. Family wants [facility] in [place]. If no availability there they would like [facility].   [facility] has availability today.   [facility] has no availability yet today. 2 open beds with 4 on wait list.   CSW informed niece [name] that only [place] has beds open right now. [name] confirmed that's the plan.   CSW spoke with [facility]; once consents are completed CSW will arrange transport.   Final next level of care: [place] Barriers to Discharge: No Barriers Identified   Patient Goals and CMS Choice Patient states their goals for this hospitalization and ongoing recovery are:: Hospice facility CMS Medicare.gov Compare Post Acute Care list provided to:: Patient Represenative (must comment) Choice offered to / list presented to : Adult Children  Discharge Placement              Patient chooses bed at:  [facility]) Patient to be transferred to facility by: [place] Name of family member notified: Mordecai Maes (Niece)   [phone] ([place]) Patient and family notified of of transfer: 11/11/20  Discharge Plan and Services   Discharge Planning Services: CM Consult Post Acute Care Choice: Home Health            DME Agency: NA       HH Arranged: PT,OT [place] Agency: [place] Date [place]: 11/04/20 Time [place]: 7106 Representative spoke with at [place]: [place] ([place]) Interventions     Readmission Risk Interventions Readmission Risk Prevention Plan 11/04/2020  Transportation Screening Complete  PCP or Specialist Appt within 3-5 Days Complete  HRI or [place] Complete  Social Work Consult for [place] Planning/Counseling Complete  Palliative Care Screening Complete  Medication Review Press photographer) Complete  Some recent data might be hidden

## 2020-11-11 NOTE — Progress Notes (Signed)
Foley catheter placed on 12/16 @ 22:55.

## 2020-11-11 NOTE — TOC Transition Note (Addendum)
Transition of Care [facility]) - CM/SW Discharge Note   Patient Details  Name: [name] MRN: [id number] Date of Birth: Mar 06, 1929  Transition of Care [facility]) CM/SW Contact:  Bethann Berkshire, [place] Phone Number: 11/11/2020, 3:42 PM   Clinical Narrative:     Patient will DC to: [facility] [place], [place] [address] Anticipated DC date: 11/11/2020 Family notified: Mordecai Maes Transport by: Corey Harold   Per MD patient ready for DC to [facility]. RN, patient, patient's family, and facility notified of DC. Discharge Summary and FL2 sent to facility. RN to call report prior to discharge [phone]). DC packet on chart. Ambulance transport scheduled for 8PM  CSW will sign off for now as social work intervention is no longer needed. Please consult [place] again if new needs arise.   Final next level of care: [place] Barriers to Discharge: No Barriers Identified   Patient Goals and CMS Choice Patient states their goals for this hospitalization and ongoing recovery are:: Hospice facility CMS Medicare.gov Compare Post Acute Care list provided to:: Patient Represenative (must comment) Choice offered to / list presented to : Adult Children  Discharge Placement              Patient chooses bed at:  [facility]) Patient to be transferred to facility by: [place] Name of family member notified: Mordecai Maes (Niece)   [phone] (Mobile) Patient and family notified of of transfer: 11/11/20  Discharge Plan and Services   Discharge Planning Services: CM Consult Post Acute Care Choice: Home Health            DME Agency: NA       HH Arranged: PT,OT [place] Agency: [place] Date [place]: 11/04/20 Time [place]: 7711 Representative spoke with at [place]: [facility]) Interventions     Readmission Risk Interventions Readmission Risk Prevention Plan 11/04/2020   Transportation Screening Complete  PCP or Specialist Appt within 3-5 Days Complete  HRI or [place] Complete  Social Work Consult for [place] Planning/Counseling Complete  Palliative Care Screening Complete  Medication Review Press photographer) Complete  Some recent data might be hidden

## 2020-11-11 NOTE — Discharge Summary (Signed)
Physician Discharge Summary  [name] XNA:[id number] DOB: [birth year] DOA: 11/01/2020  PCP: Laurey Morale, MD  Admit date: 11/01/2020 Discharge date: 11/11/2020  Admitted From: Home Disposition: Residential hospice facility   Recommendations for Outpatient Follow-up:  1. Patient will be transferred to residential hospice facility for comfort measures.  Home Health:No Equipment/Devices:None  Discharge Condition:Stable CODE STATUS:DNR Diet recommendation: Heart Healthy  Brief/Interim Summary: 86 y.o. female past medical history of atrial fibrillation, mitral stenosis, chronic kidney disease stage III, chronic hyponatremia history of colon cancer comes into the ED complaining of chest pain that has been going on for a week.  CT of the chest showed no evidence of dissection or aneurysm, 2D echo was done that showed moderate pericardial effusion with severe bilateral atrial enlargement, and no signs of physiologic tamponade, cardiology was consulted recommended to continue colchicine and eventually held but eventually resume.  on  echo she is has severe mitral stenosis and palliative care was consulted and awaiting recommendation  Discharge Diagnoses:  Principal Problem:   Acute pericarditis Active Problems:   B12 deficiency   IMPAIRED GLUCOSE TOLERANCE   Essential hypertension   ATRIAL FIBRILLATION   Acute CHF (congestive heart failure) (HCC)   Chest pain   Malnutrition of moderate degree   Nausea without vomiting   Pericardial effusion  Acute pericarditis/pericardial effusion: CT of the chest show no evidence of dissection CRP, inflammatory markers were elevated. 2D echo was done that showed an EF of 55% severe tricuspid regurgitation moderate pleural effusion and severe pulmonary hypertension and severe mitral stenosis. Cardiology was consulted he was started on metoprolol and colchicine, with minimal improvement in her pain. GI was consulted and she had an EGD that showed  erythema without bleeding. After cardiology explained to the family severe rheumatic disease and was not a candidate for any further therapy, along with severe right-sided heart failure and pulmonary hypertension with A. fib in an 86 year old with no improvement in her symptoms the family decided to move towards comfort care. All labs were stopped she was started on IV morphine and her pain improved.  Severe mitral stenosis with severe right-sided heart failure: She diuresed minimally see above for further details.  Acute kidney injury on chronic kidney disease stage III/hyperkalemia: Likely due to diuresis her potassium was treated and he was started to control, but after several doses of oral Kayexalate it improved. Renal ultrasound showed chronic medical disease.  Chronic hyponatremia  Chronic normocytic anemia  Chronic atrial fibrillation with a supratherapeutic INR: After the family decided to move towards comfort care Coumadin was discontinued she was continue metoprolol.  Essential hypertension: No further changes to her medication we stop all of her antihypertensive medication.  Has a family decided to move towards comfort care.  Asymptomatic bacteriuria Hypothyroidism Chronic thrombocytopenia  Discharge Instructions  Discharge Instructions    Diet - low sodium heart healthy   Complete by: As directed    Increase activity slowly   Complete by: As directed      Allergies as of 11/11/2020      Reactions   Codeine Nausea Only      Medication List    STOP taking these medications   acetaminophen 500 MG tablet Commonly known as: TYLENOL   ALPRAZolam 0.25 MG tablet Commonly known as: XANAX   atorvastatin 20 MG tablet Commonly known as: LIPITOR   Caltrate 600+D 600-400 MG-UNIT Tabs Generic drug: Calcium Carbonate w/Vitamin D   carvedilol 12.5 MG tablet Commonly known as: COREG   CENTRUM  SILVER PO   cetirizine 10 MG tablet Commonly known as: ZYRTEC    Climara 0.1 mg/24hr patch Generic drug: estradiol   furosemide 40 MG tablet Commonly known as: LASIX   meclizine 25 MG tablet Commonly known as: ANTIVERT   nitroGLYCERIN 0.4 MG SL tablet Commonly known as: NITROSTAT   ondansetron 8 MG tablet Commonly known as: ZOFRAN   phenazopyridine 200 MG tablet Commonly known as: Pyridium   potassium chloride SA 20 MEQ tablet Commonly known as: KLOR-CON   quinapril 10 MG tablet Commonly known as: ACCUPRIL   spironolactone 25 MG tablet Commonly known as: ALDACTONE   Synthroid 100 MCG tablet Generic drug: levothyroxine   SYSTANE FREE OP   vitamin B-12 1000 MCG tablet Commonly known as: CYANOCOBALAMIN   warfarin 5 MG tablet Commonly known as: COUMADIN     TAKE these medications   ketorolac 0.5 % ophthalmic solution Commonly known as: ACULAR Place 1 drop into the right eye 2 (two) times daily. What changed: See the new instructions.   LORazepam 2 MG/ML injection Commonly known as: ATIVAN Inject 0.5 mLs (1 mg total) into the vein every 4 (four) hours as needed for anxiety or sedation.   morphine 1 mg/mL Soln infusion Inject 2 mg/hr into the vein continuous.   promethazine 25 MG/ML injection Commonly known as: PHENERGAN Inject 0.5 mLs (12.5 mg total) into the vein every 6 (six) hours as needed.       Follow-up Information    [facility], [facility] Follow up.   Specialty: Home Health Services Why: HHPT,HHOT Contact information: [address] [place] [address] [phone]              Allergies  Allergen Reactions  . Codeine Nausea Only    Consultations: Cardiology Gastroenterology   Procedures/Studies: DG Chest 2 View  Result Date: 11/01/2020 CLINICAL DATA:  Chest pain EXAM: CHEST - 2 VIEW COMPARISON:  12/07/2019 and prior. FINDINGS: Left predominant patchy bibasilar opacities. No pneumothorax or pleural effusion. Cardiomegaly and central pulmonary vascular congestion, unchanged.  Multilevel spondylosis. Osteopenia. IMPRESSION: Cardiomegaly and central pulmonary vascular congestion. Patchy bibasilar opacities. Differential includes atelectasis, infection or edema. Electronically Signed   By: Primitivo Gauze M.D.   On: 11/01/2020 17:37   US RENAL  Result Date: 11/04/2020 CLINICAL DATA:  Acute kidney insufficiency. EXAM: RENAL / URINARY TRACT ULTRASOUND COMPLETE COMPARISON:  CT a of the abdomen 11/01/2020 FINDINGS: Right Kidney: Renal measurements: 10.8 x 4.2 x 5.6 cm = volume: 134.6 mL. There is some thinning of the renal parenchyma. It is mildly hyperechoic to the index organ, the liver. No discrete lesions are present. No stone or obstruction is present. Left Kidney: Renal measurements: 11.6 x 4.8 x 4.6 cm = volume: 134.7 mL. There is some thinning of the renal parenchyma. Parenchyma is isoechoic to the index organ, the spleen. No discrete lesions are present. No stone or obstruction is present. Bladder: Appears normal for degree of bladder distention. Other: None. IMPRESSION: 1. Thinning of the renal parenchyma bilaterally with increased echogenicity. Findings are nonspecific, but can be seen in the setting of medical renal disease. 2. No acute abnormality. No obstruction or mass lesion. Electronically Signed   By: San Morelle M.D.   On: 11/04/2020 08:17   DG CHEST PORT 1 VIEW  Result Date: 11/08/2020 CLINICAL DATA:  Shortness of breath EXAM: PORTABLE CHEST 1 VIEW COMPARISON:  11/01/2020 FINDINGS: Cardiomegaly, vascular congestion. Layering bilateral effusions with lower lobe airspace opacities, likely edema and/or atelectasis. No  acute bony abnormality. IMPRESSION: Cardiomegaly with vascular congestion and probable mild pulmonary edema. Low lung volumes with bibasilar atelectasis. Electronically Signed   By: Rolm Baptise M.D.   On: 11/08/2020 01:04   ECHOCARDIOGRAM COMPLETE  Result Date: 11/02/2020    ECHOCARDIOGRAM REPORT   Patient Name:   [name] Date of  Exam: 11/02/2020 Medical Rec #:  [id number]      Height:       71.0 in Accession #:    [id number]     Weight:       170.0 lb Date of Birth:  January 26, 1929      BSA:          1.968 m Patient Age:    81 years       BP:           100/71 mmHg Patient Gender: F              HR:           106 bpm. Exam Location:  Inpatient Procedure: 2D Echo, Color Doppler and Cardiac Doppler Indications:    Pericardial effusion 423.9 / I31.3  History:        Patient has prior history of Echocardiogram examinations, most                 recent 03/30/2020. CHF; Mitral Valve Disease and Aortic Valve                 Disease. Chronic atrial fibrillation. COVID-19. PFO. Anxiety.                 Chest pain. Peripheral vascular disease. Chronic kidney disease                 stage III,.  Sonographer:    Darlina Sicilian RDCS Referring Phys: [place]  1. Left ventricular ejection fraction, by estimation, is 55 to 60%. The left ventricle has normal function. The left ventricle has no regional wall motion abnormalities. There is moderate left ventricular hypertrophy. Left ventricular diastolic parameters are indeterminate. There is the interventricular septum is flattened in systole and diastole, consistent with right ventricular pressure and volume overload.  2. Right ventricular systolic function is moderately reduced. The right ventricular size is moderately enlarged. There is moderately elevated pulmonary artery systolic pressure. The estimated right ventricular systolic pressure is 76.2 mmHg.  3. Left atrial size was severely dilated.  4. Right atrial size was mildly dilated.  5. The mitral valve is degenerative. Mild to moderate mitral valve regurgitation. Severe mitral stenosis. Severe mitral annular calcification. MG 19 mmHg at HR 117 bpm, MVA 0.7 cm^2 by continuity equation  6. Tricuspid valve regurgitation is moderate to severe.  7. The aortic valve is tricuspid. There is severe calcifcation of the aortic valve. Aortic  valve regurgitation is trivial. Mild to moderate aortic valve stenosis. Mild AS by gradients (Vmax 2.5 m/s, MG 14 mmHG), likely due to low SV (SVi 21 cc/m^2). AS is moderate by AVA (1.1 cm^2) and DI (0.31). Suspect moderate AS.  8. The inferior vena cava is dilated in size with <50% respiratory variability, suggesting right atrial pressure of 15 mmHg.  9. Moderate pericardial effusion measuring 1.5cm adjacent to LV posterior wall. No evidence of tamponade. IVC is fixed/dilated, but no RA/RV collapse or significant mitral inflow respiratory variation seen. FINDINGS  Left Ventricle: Left ventricular ejection fraction, by estimation, is 55 to 60%. The left ventricle has normal function. The left ventricle has  no regional wall motion abnormalities. The left ventricular internal cavity size was small. There is moderate  left ventricular hypertrophy. The interventricular septum is flattened in systole and diastole, consistent with right ventricular pressure and volume overload. Left ventricular diastolic parameters are indeterminate. Right Ventricle: The right ventricular size is moderately enlarged. Right vetricular wall thickness was not assessed. Right ventricular systolic function is moderately reduced. There is moderately elevated pulmonary artery systolic pressure. The tricuspid regurgitant velocity is 3.34 m/s, and with an assumed right atrial pressure of 15 mmHg, the estimated right ventricular systolic pressure is 06.3 mmHg. Left Atrium: Left atrial size was severely dilated. Right Atrium: Right atrial size was mildly dilated. Pericardium: Moderate pericardial effusion measuring 1.2cm adjacent to LV posterior wall. No evidence of tamponade. IVC is fixed/dilated, but no RA/RV collapse or significant mitral inflow respiratory variation seen. A moderately sized pericardial effusion is present. Mitral Valve: The mitral valve is degenerative in appearance. Severe mitral annular calcification. Mild to moderate mitral  valve regurgitation. Severe mitral valve stenosis. MV peak gradient, 25.3 mmHg. The mean mitral valve gradient is 16.3 mmHg. Tricuspid Valve: The tricuspid valve is normal in structure. Tricuspid valve regurgitation is moderate to severe. Aortic Valve: The aortic valve is tricuspid. There is severe calcifcation of the aortic valve. Aortic valve regurgitation is trivial. Mild to moderate aortic stenosis is present. Aortic valve mean gradient measures 11.7 mmHg. Aortic valve peak gradient measures 22.3 mmHg. Aortic valve area, by VTI measures 1.19 cm. Pulmonic Valve: The pulmonic valve was not well visualized. Pulmonic valve regurgitation is trivial. Aorta: The aortic root is normal in size and structure. Venous: The inferior vena cava is dilated in size with less than 50% respiratory variability, suggesting right atrial pressure of 15 mmHg. IAS/Shunts: The interatrial septum was not well visualized.  LEFT VENTRICLE PLAX 2D LVIDd:         3.70 cm LVIDs:         2.40 cm LV PW:         1.20 cm LV IVS:        1.20 cm LVOT diam:     2.10 cm LV SV:         41 LV SV Index:   21 LVOT Area:     3.46 cm  RIGHT VENTRICLE TAPSE (M-mode): 1.0 cm LEFT ATRIUM              Index        RIGHT ATRIUM           Index LA diam:        7.90 cm  4.01 cm/m   RA Area:     25.40 cm LA Vol (A2C):   239.0 ml 121.45 ml/m RA Volume:   77.40 ml  39.33 ml/m LA Vol (A4C):   246.0 ml 125.01 ml/m LA Biplane Vol: 243.0 ml 123.49 ml/m  AORTIC VALVE AV Area (Vmax):    1.25 cm AV Area (Vmean):   1.26 cm AV Area (VTI):     1.19 cm AV Vmax:           236.00 cm/s AV Vmean:          156.000 cm/s AV VTI:            0.348 m AV Peak Grad:      22.3 mmHg AV Mean Grad:      11.7 mmHg LVOT Vmax:         84.85 cm/s LVOT Vmean:  56.750 cm/s LVOT VTI:          0.120 m LVOT/AV VTI ratio: 0.34  AORTA Ao Root diam: 3.40 cm MITRAL VALVE                 TRICUSPID VALVE MV Area (PHT): 1.51 cm      TR Peak grad:   44.6 mmHg MV Peak grad:  25.3 mmHg     TR  Vmax:        334.00 cm/s MV Mean grad:  16.3 mmHg MV Vmax:       2.51 m/s      SHUNTS MV Vmean:      194.0 cm/s    Systemic VTI:  0.12 m MV Decel Time: 504 msec      Systemic Diam: 2.10 cm MR Peak grad:    79.6 mmHg MR Mean grad:    53.0 mmHg MR Vmax:         446.00 cm/s MR Vmean:        350.0 cm/s MR PISA:         1.90 cm MR PISA Eff ROA: 15 mm MR PISA Radius:  0.55 cm MV E velocity: 255.00 cm/s Oswaldo Milian MD Electronically signed by Oswaldo Milian MD Signature Date/Time: 11/02/2020/2:28:29 PM    Final    CT Angio Chest/Abd/Pel for Dissection W and/or Wo Contrast  Result Date: 11/01/2020 CLINICAL DATA:  Abdominal pain.  Chest pain.  Shortness of breath. EXAM: CT ANGIOGRAPHY CHEST, ABDOMEN AND PELVIS TECHNIQUE: Non-contrast CT of the chest was initially obtained. Multidetector CT imaging through the chest, abdomen and pelvis was performed using the standard protocol during bolus administration of intravenous contrast. Multiplanar reconstructed images and MIPs were obtained and reviewed to evaluate the vascular anatomy. CONTRAST:  163mL OMNIPAQUE IOHEXOL 350 MG/ML SOLN COMPARISON:  February 17, 2016 FINDINGS: CTA CHEST FINDINGS Cardiovascular: There is significant cardiomegaly with severe biatrial enlargement. There are atherosclerotic changes of the thoracic aorta without evidence for an aneurysm or dissection. There are coronary artery calcifications. There is a moderate-sized pericardial effusion. There is no evidence for a large centrally located pulmonary embolism. There is reflux of contrast into the IVC. Mediastinum/Nodes: -- No mediastinal lymphadenopathy. -- No hilar lymphadenopathy. -- No axillary lymphadenopathy. -- No supraclavicular lymphadenopathy. -- Normal thyroid gland where visualized. -  Unremarkable esophagus. Lungs/Pleura: There are trace to small bilateral pleural effusions with adjacent atelectasis, left worse than right. There is a mosaic appearance of the lung parenchyma  bilaterally. There is a ground-glass airspace opacity in the left upper lobe measuring approximately 6 mm (axial series 11, image 35). There is bilateral interlobular septal thickening the trachea is unremarkable. Musculoskeletal: No chest wall abnormality. No bony spinal canal stenosis. Review of the MIP images confirms the above findings. CTA ABDOMEN AND PELVIS FINDINGS VASCULAR Aorta: There are atherosclerotic changes of the abdominal aorta without evidence for an aneurysm or dissection. Celiac: There is moderate narrowing at the origin of the celiac axis. SMA: Patent without evidence of aneurysm, dissection, vasculitis or significant stenosis. Renals: Both renal arteries are patent without evidence of aneurysm, dissection, vasculitis, fibromuscular dysplasia or significant stenosis. There are 2 right renal arteries. IMA: Patent without evidence of aneurysm, dissection, vasculitis or significant stenosis. Inflow: Patent without evidence of aneurysm, dissection, vasculitis or significant stenosis. Veins: No obvious venous abnormality within the limitations of this arterial phase study. Review of the MIP images confirms the above findings. NON-VASCULAR Hepatobiliary: The liver is enlarged and somewhat nodular in appearance. There  is suggestion of hepatic venous congestion which may be secondary to right-sided heart failure. Normal gallbladder.There is no biliary ductal dilation. Pancreas: Normal contours without ductal dilatation. No peripancreatic fluid collection. Spleen: The spleen is enlarged measuring 13 cm craniocaudad. Adrenals/Urinary Tract: --Adrenal glands: Unremarkable. --Right kidney/ureter: No hydronephrosis or radiopaque kidney stones. --Left kidney/ureter: No hydronephrosis or radiopaque kidney stones. --Urinary bladder: Unremarkable. Stomach/Bowel: --Stomach/Duodenum: No hiatal hernia or other gastric abnormality. Normal duodenal course and caliber. --Small bowel: Unremarkable. --Colon: Rectosigmoid  diverticulosis without acute inflammation. The patient appears to be status post prior right hemicolectomy. --Appendix: Surgically absent. Lymphatic: --No retroperitoneal lymphadenopathy. --No mesenteric lymphadenopathy. --No pelvic or inguinal lymphadenopathy. Reproductive: Status post hysterectomy. No adnexal mass. Other: There is a small amount of free fluid in the abdomen and pelvis. The abdominal wall is normal. Musculoskeletal. No acute displaced fractures. Review of the MIP images confirms the above findings. IMPRESSION: 1. No evidence for aortic dissection or aneurysm. 2. Cardiomegaly with severe biatrial enlargement. There is a moderate-sized pericardial effusion. 3. Findings of congestive heart failure with developing pulmonary edema. 4. Ground-glass airspace opacity in the left upper lobe measuring approximately 6 mm. This may be secondary to underlying pulmonary edema. Follow-up recommendations are as follows. Initial follow-up with CT at 6-12 months is recommended to confirm persistence. If persistent, repeat CT is recommended every 2 years until 5 years of stability has been established. This recommendation follows the consensus statement: Guidelines for Management of Incidental Pulmonary Nodules Detected on CT Images: From the Fleischner Society 2017; Radiology 2017; 284:228-243. Aortic Atherosclerosis (ICD10-I70.0). Electronically Signed   By: Constance Holster M.D.   On: 11/01/2020 19:52    (Echo, Carotid, EGD, Colonoscopy, ERCP)    Subjective: She relates her pain is relatively improved.  Discharge Exam: Vitals:   11/10/20 1144 11/10/20 2027  BP: 100/83 103/72  Pulse: 81 (!) 115  Resp: 16 17  Temp: 97.6 F (36.4 C) 98.1 F (36.7 C)  SpO2: 98% 98%   Vitals:   11/10/20 0407 11/10/20 0924 11/10/20 1144 11/10/20 2027  BP: 117/87 95/77 100/83 103/72  Pulse: 93 94 81 (!) 115  Resp: 17 18 16 17   Temp:   97.6 F (36.4 C) 98.1 F (36.7 C)  TempSrc:   Oral Oral  SpO2: 99% 95%  98% 98%  Weight: 82.9 kg     Height:        General: Pt is alert, awake, not in acute distress Cardiovascular: RRR, S1/S2 +, no rubs, no gallops Respiratory: CTA bilaterally, no wheezing, no rhonchi Abdominal: Soft, NT, ND, bowel sounds + Extremities: no edema, no cyanosis    The results of significant diagnostics from this hospitalization (including imaging, microbiology, ancillary and laboratory) are listed below for reference.     Microbiology: Recent Results (from the past 240 hour(s))  Resp Panel by RT-PCR (Flu A&B, Covid) Nasopharyngeal Swab     Status: None   Collection Time: 11/01/20  7:58 PM   Specimen: Nasopharyngeal Swab; Nasopharyngeal(NP) swabs in vial transport medium  Result Value Ref Range Status   SARS Coronavirus 2 by RT PCR NEGATIVE NEGATIVE Final    Comment: (NOTE) SARS-CoV-2 target nucleic acids are NOT DETECTED.  The SARS-CoV-2 RNA is generally detectable in upper respiratory specimens during the acute phase of infection. The lowest concentration of SARS-CoV-2 viral copies this assay can detect is 138 copies/mL. A negative result does not preclude SARS-Cov-2 infection and should not be used as the sole basis for treatment or other patient management decisions. A  negative result may occur with  improper specimen collection/handling, submission of specimen other than nasopharyngeal swab, presence of viral mutation(s) within the areas targeted by this assay, and inadequate number of viral copies(<138 copies/mL). A negative result must be combined with clinical observations, patient history, and epidemiological information. The expected result is Negative.  Fact Sheet for Patients:  EntrepreneurPulse.com.au  Fact Sheet for Healthcare Providers:  IncredibleEmployment.be  This test is no t yet approved or cleared by the [place] FDA and  has been authorized for detection and/or diagnosis of SARS-CoV-2 by FDA under  an Emergency Use Authorization (EUA). This EUA will remain  in effect (meaning this test can be used) for the duration of the COVID-19 declaration under Section 564(b)(1) of the Act, 21 U.S.C.section 360bbb-3(b)(1), unless the authorization is terminated  or revoked sooner.       Influenza A by PCR NEGATIVE NEGATIVE Final   Influenza B by PCR NEGATIVE NEGATIVE Final    Comment: (NOTE) The Xpert Xpress SARS-CoV-2/FLU/RSV plus assay is intended as an aid in the diagnosis of influenza from Nasopharyngeal swab specimens and should not be used as a sole basis for treatment. Nasal washings and aspirates are unacceptable for Xpert Xpress SARS-CoV-2/FLU/RSV testing.  Fact Sheet for Patients: EntrepreneurPulse.com.au  Fact Sheet for Healthcare Providers: IncredibleEmployment.be  This test is not yet approved or cleared by the [place] FDA and has been authorized for detection and/or diagnosis of SARS-CoV-2 by FDA under an Emergency Use Authorization (EUA). This EUA will remain in effect (meaning this test can be used) for the duration of the COVID-19 declaration under Section 564(b)(1) of the Act, 21 U.S.C. section 360bbb-3(b)(1), unless the authorization is terminated or revoked.  Performed at [facility], [place] [address], [place], [place] [address]   Urine Culture     Status: Abnormal   Collection Time: 11/02/20  6:10 AM   Specimen: Urine, Random  Result Value Ref Range Status   Specimen Description URINE, RANDOM  Final   Special Requests   Final    NONE Performed at [facility], [place] [address], [place], [place] [address]    Culture 10,000 COLONIES/mL PSEUDOMONAS AERUGINOSA (A)  Final   Report Status 11/04/2020 FINAL  Final   Organism ID, Bacteria PSEUDOMONAS AERUGINOSA (A)  Final      Susceptibility   Pseudomonas aeruginosa - MIC*    CEFTAZIDIME <=1 SENSITIVE Sensitive     CIPROFLOXACIN 0.5 SENSITIVE Sensitive      GENTAMICIN <=1 SENSITIVE Sensitive     IMIPENEM 2 SENSITIVE Sensitive     PIP/TAZO <=4 SENSITIVE Sensitive     CEFEPIME 0.5 SENSITIVE Sensitive     * 10,000 COLONIES/mL PSEUDOMONAS AERUGINOSA     Labs: BNP (last 3 results) Recent Labs    11/01/20 1827  BNP 185.6*   Basic Metabolic Panel: Recent Labs  Lab 11/05/20 0308 11/06/20 0410 11/07/20 0653 11/08/20 0424 11/09/20 0310  NA 126* 129* 133* 134* 134*  K 4.6 4.1 4.2 4.5 4.8  CL 96* 99 103 104 103  CO2 21* 20* 23 21* 18*  GLUCOSE 140* 99 125* 112* 117*  BUN 63* 42* 27* 22 25*  CREATININE 1.78* 1.17* 0.88 0.94 1.09*  CALCIUM 8.4* 8.5* 8.3* 8.7* 9.0   Liver Function Tests: Recent Labs  Lab 11/07/20 0653  AST 59*  ALT 73*  ALKPHOS 122  BILITOT 1.8*  PROT 5.7*  ALBUMIN 2.9*   No results for input(s): LIPASE, AMYLASE in the last 168 hours.  No results for input(s): AMMONIA in the last 168 hours. CBC: Recent Labs  Lab 11/06/20 0410 11/07/20 0653 11/08/20 0424 11/09/20 0310 11/10/20 0233  WBC 8.3 8.6 10.0 11.0* 11.2*  HGB 11.8* 10.9* 11.9* 12.1 12.0  HCT 34.7* 32.5* 35.1* 38.1 36.1  MCV 96.7 97.3 98.0 98.4 98.1  PLT 206 215 241 251 299   Cardiac Enzymes: No results for input(s): CKTOTAL, CKMB, CKMBINDEX, TROPONINI in the last 168 hours. BNP: Invalid input(s): POCBNP CBG: No results for input(s): GLUCAP in the last 168 hours. D-Dimer No results for input(s): DDIMER in the last 72 hours. Hgb A1c No results for input(s): HGBA1C in the last 72 hours. Lipid Profile No results for input(s): CHOL, HDL, LDLCALC, TRIG, CHOLHDL, LDLDIRECT in the last 72 hours. Thyroid function studies No results for input(s): TSH, T4TOTAL, T3FREE, THYROIDAB in the last 72 hours.  Invalid input(s): FREET3 Anemia work up No results for input(s): VITAMINB12, FOLATE, FERRITIN, TIBC, IRON, RETICCTPCT in the last 72 hours. Urinalysis    Component Value Date/Time   COLORURINE AMBER (A) 11/04/2020 1727   APPEARANCEUR HAZY (A)  11/04/2020 1727   LABSPEC 1.017 11/04/2020 1727   PHURINE 5.0 11/04/2020 1727   GLUCOSEU NEGATIVE 11/04/2020 1727   GLUCOSEU NEGATIVE 07/15/2008 1258   HGBUR NEGATIVE 11/04/2020 1727   BILIRUBINUR NEGATIVE 11/04/2020 1727   BILIRUBINUR negative 05/25/2020 1430   KETONESUR NEGATIVE 11/04/2020 1727   PROTEINUR NEGATIVE 11/04/2020 1727   UROBILINOGEN 0.2 05/25/2020 1430   UROBILINOGEN 0.2 09/12/2014 1810   NITRITE NEGATIVE 11/04/2020 1727   LEUKOCYTESUR NEGATIVE 11/04/2020 1727   Sepsis Labs Invalid input(s): PROCALCITONIN,  WBC,  LACTICIDVEN Microbiology Recent Results (from the past 240 hour(s))  Resp Panel by RT-PCR (Flu A&B, Covid) Nasopharyngeal Swab     Status: None   Collection Time: 11/01/20  7:58 PM   Specimen: Nasopharyngeal Swab; Nasopharyngeal(NP) swabs in vial transport medium  Result Value Ref Range Status   SARS Coronavirus 2 by RT PCR NEGATIVE NEGATIVE Final    Comment: (NOTE) SARS-CoV-2 target nucleic acids are NOT DETECTED.  The SARS-CoV-2 RNA is generally detectable in upper respiratory specimens during the acute phase of infection. The lowest concentration of SARS-CoV-2 viral copies this assay can detect is 138 copies/mL. A negative result does not preclude SARS-Cov-2 infection and should not be used as the sole basis for treatment or other patient management decisions. A negative result may occur with  improper specimen collection/handling, submission of specimen other than nasopharyngeal swab, presence of viral mutation(s) within the areas targeted by this assay, and inadequate number of viral copies(<138 copies/mL). A negative result must be combined with clinical observations, patient history, and epidemiological information. The expected result is Negative.  Fact Sheet for Patients:  EntrepreneurPulse.com.au  Fact Sheet for Healthcare Providers:  IncredibleEmployment.be  This test is no t yet approved or cleared by  the [place] FDA and  has been authorized for detection and/or diagnosis of SARS-CoV-2 by FDA under an Emergency Use Authorization (EUA). This EUA will remain  in effect (meaning this test can be used) for the duration of the COVID-19 declaration under Section 564(b)(1) of the Act, 21 U.S.C.section 360bbb-3(b)(1), unless the authorization is terminated  or revoked sooner.       Influenza A by PCR NEGATIVE NEGATIVE Final   Influenza B by PCR NEGATIVE NEGATIVE Final    Comment: (NOTE) The Xpert Xpress SARS-CoV-2/FLU/RSV plus assay is intended as an aid in the diagnosis of influenza from Nasopharyngeal swab specimens and should not  be used as a sole basis for treatment. Nasal washings and aspirates are unacceptable for Xpert Xpress SARS-CoV-2/FLU/RSV testing.  Fact Sheet for Patients: EntrepreneurPulse.com.au  Fact Sheet for Healthcare Providers: IncredibleEmployment.be  This test is not yet approved or cleared by the [place] FDA and has been authorized for detection and/or diagnosis of SARS-CoV-2 by FDA under an Emergency Use Authorization (EUA). This EUA will remain in effect (meaning this test can be used) for the duration of the COVID-19 declaration under Section 564(b)(1) of the Act, 21 U.S.C. section 360bbb-3(b)(1), unless the authorization is terminated or revoked.  Performed at [facility], [place] [address], [place],  [address]   Urine Culture     Status: Abnormal   Collection Time: 11/02/20  6:10 AM   Specimen: Urine, Random  Result Value Ref Range Status   Specimen Description URINE, RANDOM  Final   Special Requests   Final    NONE Performed at [facility], [place] [address], [place], [place] [address]    Culture 10,000 COLONIES/mL PSEUDOMONAS AERUGINOSA (A)  Final   Report Status 11/04/2020 FINAL  Final   Organism ID, Bacteria PSEUDOMONAS AERUGINOSA (A)  Final      Susceptibility   Pseudomonas  aeruginosa - MIC*    CEFTAZIDIME <=1 SENSITIVE Sensitive     CIPROFLOXACIN 0.5 SENSITIVE Sensitive     GENTAMICIN <=1 SENSITIVE Sensitive     IMIPENEM 2 SENSITIVE Sensitive     PIP/TAZO <=4 SENSITIVE Sensitive     CEFEPIME 0.5 SENSITIVE Sensitive     * 10,000 COLONIES/mL PSEUDOMONAS AERUGINOSA     Time coordinating discharge: Over 30 minutes  SIGNED:   Charlynne Cousins, MD  [facility] 11/11/2020, 8:22 AM Pager   If 7PM-7AM, please contact night-coverage [email or web address] Password [email or web address]

## 2020-11-15 NOTE — Telephone Encounter (Signed)
Spoke with Horris Latino, verbal given.

## 2020-11-15 NOTE — Telephone Encounter (Signed)
Selena Batten agree with Hospice and I will be glad to follow as the attending

## 2020-11-26 DEATH — deceased

## 2022-07-05 ENCOUNTER — Encounter (INDEPENDENT_AMBULATORY_CARE_PROVIDER_SITE_OTHER): Payer: Medicare Other | Admitting: Ophthalmology
